# Patient Record
Sex: Female | Born: 1960 | Race: Black or African American | Hispanic: No | Marital: Single | Smoking: Former smoker
Health system: Southern US, Community
[De-identification: ages and names within clinical notes are randomized; demographics above are authoritative.]

## PROBLEM LIST (undated history)

## (undated) DIAGNOSIS — J449 Chronic obstructive pulmonary disease, unspecified: Secondary | ICD-10-CM

## (undated) DIAGNOSIS — I340 Nonrheumatic mitral (valve) insufficiency: Secondary | ICD-10-CM

## (undated) DIAGNOSIS — N2581 Secondary hyperparathyroidism of renal origin: Secondary | ICD-10-CM

## (undated) DIAGNOSIS — N189 Chronic kidney disease, unspecified: Secondary | ICD-10-CM

## (undated) DIAGNOSIS — I10 Essential (primary) hypertension: Secondary | ICD-10-CM

## (undated) DIAGNOSIS — N184 Chronic kidney disease, stage 4 (severe): Secondary | ICD-10-CM

## (undated) DIAGNOSIS — I739 Peripheral vascular disease, unspecified: Secondary | ICD-10-CM

## (undated) DIAGNOSIS — K2289 Other specified disease of esophagus: Secondary | ICD-10-CM

## (undated) DIAGNOSIS — E785 Hyperlipidemia, unspecified: Secondary | ICD-10-CM

## (undated) DIAGNOSIS — R809 Proteinuria, unspecified: Secondary | ICD-10-CM

## (undated) DIAGNOSIS — E119 Type 2 diabetes mellitus without complications: Secondary | ICD-10-CM

## (undated) DIAGNOSIS — Z794 Long term (current) use of insulin: Secondary | ICD-10-CM

## (undated) DIAGNOSIS — I119 Hypertensive heart disease without heart failure: Secondary | ICD-10-CM

## (undated) DIAGNOSIS — E669 Obesity, unspecified: Secondary | ICD-10-CM

## (undated) DIAGNOSIS — I2510 Atherosclerotic heart disease of native coronary artery without angina pectoris: Secondary | ICD-10-CM

## (undated) DIAGNOSIS — M869 Osteomyelitis, unspecified: Secondary | ICD-10-CM

## (undated) DIAGNOSIS — H547 Unspecified visual loss: Secondary | ICD-10-CM

## (undated) DIAGNOSIS — I5189 Other ill-defined heart diseases: Secondary | ICD-10-CM

## (undated) DIAGNOSIS — I700 Atherosclerosis of aorta: Secondary | ICD-10-CM

## (undated) DIAGNOSIS — Z89432 Acquired absence of left foot: Secondary | ICD-10-CM

## (undated) HISTORY — DX: Essential (primary) hypertension: I10

## (undated) HISTORY — PX: CATARACT EXTRACTION W/ INTRAOCULAR LENS IMPLANT: SHX1309

## (undated) HISTORY — PX: CATARACT EXTRACTION: SUR2

## (undated) HISTORY — DX: Nonrheumatic mitral (valve) insufficiency: I34.0

## (undated) HISTORY — DX: Hyperlipidemia, unspecified: E78.5

## (undated) HISTORY — PX: OTHER SURGICAL HISTORY: SHX169

---
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
## 2003-05-29 ENCOUNTER — Other Ambulatory Visit: Payer: Self-pay

## 2005-03-12 ENCOUNTER — Emergency Department: Payer: Self-pay | Admitting: Emergency Medicine

## 2005-03-13 ENCOUNTER — Inpatient Hospital Stay: Payer: Self-pay | Admitting: Internal Medicine

## 2005-06-19 ENCOUNTER — Ambulatory Visit: Payer: Self-pay

## 2005-07-31 ENCOUNTER — Ambulatory Visit: Payer: Self-pay | Admitting: Gynecologic Oncology

## 2005-08-15 ENCOUNTER — Inpatient Hospital Stay: Payer: Self-pay | Admitting: Unknown Physician Specialty

## 2005-08-15 ENCOUNTER — Other Ambulatory Visit: Payer: Self-pay

## 2005-11-10 ENCOUNTER — Emergency Department: Payer: Self-pay | Admitting: Emergency Medicine

## 2006-02-12 ENCOUNTER — Ambulatory Visit: Payer: Self-pay | Admitting: Gynecologic Oncology

## 2006-06-11 ENCOUNTER — Ambulatory Visit: Payer: Self-pay | Admitting: Unknown Physician Specialty

## 2006-07-30 ENCOUNTER — Ambulatory Visit: Payer: Self-pay | Admitting: Ophthalmology

## 2006-08-06 ENCOUNTER — Ambulatory Visit: Payer: Self-pay

## 2007-05-19 ENCOUNTER — Ambulatory Visit: Payer: Self-pay | Admitting: Gynecologic Oncology

## 2007-10-06 ENCOUNTER — Ambulatory Visit: Payer: Self-pay | Admitting: Gynecologic Oncology

## 2007-12-08 ENCOUNTER — Ambulatory Visit: Payer: Self-pay | Admitting: Gynecologic Oncology

## 2007-12-16 ENCOUNTER — Ambulatory Visit: Payer: Self-pay | Admitting: Gynecologic Oncology

## 2008-01-13 ENCOUNTER — Ambulatory Visit: Payer: Self-pay

## 2008-01-24 ENCOUNTER — Other Ambulatory Visit: Payer: Self-pay

## 2008-01-24 ENCOUNTER — Emergency Department: Payer: Self-pay | Admitting: Emergency Medicine

## 2008-02-09 ENCOUNTER — Ambulatory Visit: Payer: Self-pay | Admitting: Gynecologic Oncology

## 2008-03-22 ENCOUNTER — Other Ambulatory Visit: Payer: Self-pay

## 2008-03-22 ENCOUNTER — Inpatient Hospital Stay: Payer: Self-pay | Admitting: Internal Medicine

## 2008-05-02 ENCOUNTER — Encounter: Payer: Self-pay | Admitting: Internal Medicine

## 2008-05-12 ENCOUNTER — Inpatient Hospital Stay: Payer: Self-pay | Admitting: Internal Medicine

## 2008-05-17 ENCOUNTER — Encounter: Payer: Self-pay | Admitting: Internal Medicine

## 2008-06-17 ENCOUNTER — Encounter: Payer: Self-pay | Admitting: Internal Medicine

## 2008-07-18 ENCOUNTER — Encounter: Payer: Self-pay | Admitting: Internal Medicine

## 2008-07-31 ENCOUNTER — Inpatient Hospital Stay: Payer: Self-pay | Admitting: Internal Medicine

## 2008-08-15 ENCOUNTER — Encounter: Payer: Self-pay | Admitting: Internal Medicine

## 2008-08-15 ENCOUNTER — Ambulatory Visit: Payer: Self-pay | Admitting: Gynecologic Oncology

## 2008-08-23 ENCOUNTER — Ambulatory Visit: Payer: Self-pay | Admitting: Gynecologic Oncology

## 2008-09-15 ENCOUNTER — Encounter: Payer: Self-pay | Admitting: Internal Medicine

## 2008-10-15 ENCOUNTER — Encounter: Payer: Self-pay | Admitting: Internal Medicine

## 2008-10-19 ENCOUNTER — Ambulatory Visit: Payer: Self-pay | Admitting: Family

## 2008-10-23 ENCOUNTER — Emergency Department: Payer: Self-pay | Admitting: Emergency Medicine

## 2008-11-02 ENCOUNTER — Other Ambulatory Visit: Payer: Self-pay | Admitting: Family

## 2008-11-10 ENCOUNTER — Inpatient Hospital Stay: Payer: Self-pay | Admitting: Family

## 2008-11-15 ENCOUNTER — Encounter: Payer: Self-pay | Admitting: Internal Medicine

## 2009-01-04 ENCOUNTER — Other Ambulatory Visit: Payer: Self-pay | Admitting: Family

## 2009-01-05 ENCOUNTER — Other Ambulatory Visit: Payer: Self-pay | Admitting: Internal Medicine

## 2009-01-11 ENCOUNTER — Ambulatory Visit: Payer: Self-pay | Admitting: Internal Medicine

## 2009-01-18 ENCOUNTER — Ambulatory Visit: Payer: Self-pay | Admitting: Internal Medicine

## 2009-01-25 ENCOUNTER — Ambulatory Visit: Payer: Self-pay | Admitting: Internal Medicine

## 2009-02-01 ENCOUNTER — Ambulatory Visit: Payer: Self-pay | Admitting: Family

## 2009-02-15 ENCOUNTER — Ambulatory Visit: Payer: Self-pay | Admitting: Internal Medicine

## 2009-04-21 ENCOUNTER — Ambulatory Visit: Payer: Self-pay | Admitting: Family

## 2009-05-31 ENCOUNTER — Ambulatory Visit: Payer: Self-pay | Admitting: Internal Medicine

## 2009-06-12 ENCOUNTER — Ambulatory Visit: Payer: Self-pay | Admitting: Family

## 2009-08-18 ENCOUNTER — Ambulatory Visit: Payer: Self-pay | Admitting: Family

## 2009-10-11 ENCOUNTER — Ambulatory Visit: Payer: Self-pay | Admitting: Ophthalmology

## 2009-12-13 ENCOUNTER — Ambulatory Visit: Payer: Self-pay | Admitting: Ophthalmology

## 2010-08-20 ENCOUNTER — Ambulatory Visit: Payer: Self-pay | Admitting: Internal Medicine

## 2011-02-04 ENCOUNTER — Other Ambulatory Visit: Payer: Self-pay | Admitting: *Deleted

## 2011-02-04 ENCOUNTER — Telehealth: Payer: Self-pay | Admitting: *Deleted

## 2011-02-04 MED ORDER — CARVEDILOL 12.5 MG PO TABS: 12.5000 mg | ORAL_TABLET | Freq: Two times a day (BID) | ORAL | Status: DC

## 2011-02-04 MED ORDER — INSULIN GLARGINE 100 UNIT/ML ~~LOC~~ SOLN: 56.0000 [IU] | Freq: Every day | SUBCUTANEOUS | Status: DC

## 2011-02-04 NOTE — Telephone Encounter (Signed)
Home health RN is req refill for patient. Pt needs Lantus solostar 56 u hs, Med list & pharm updated. OK to send in RF?

## 2011-02-04 NOTE — Telephone Encounter (Signed)
Fine to refill 

## 2011-02-15 ENCOUNTER — Other Ambulatory Visit: Payer: Self-pay | Admitting: Internal Medicine

## 2011-02-15 DIAGNOSIS — E785 Hyperlipidemia, unspecified: Secondary | ICD-10-CM

## 2011-02-15 MED ORDER — SIMVASTATIN 20 MG PO TABS: 20.0000 mg | ORAL_TABLET | Freq: Every evening | ORAL | Status: DC

## 2011-03-26 DIAGNOSIS — R32 Unspecified urinary incontinence: Secondary | ICD-10-CM

## 2011-03-26 DIAGNOSIS — N390 Urinary tract infection, site not specified: Secondary | ICD-10-CM

## 2011-03-26 DIAGNOSIS — E1139 Type 2 diabetes mellitus with other diabetic ophthalmic complication: Secondary | ICD-10-CM

## 2011-03-26 DIAGNOSIS — E11319 Type 2 diabetes mellitus with unspecified diabetic retinopathy without macular edema: Secondary | ICD-10-CM

## 2011-04-01 ENCOUNTER — Encounter: Payer: Self-pay | Admitting: Internal Medicine

## 2011-04-01 ENCOUNTER — Ambulatory Visit (INDEPENDENT_AMBULATORY_CARE_PROVIDER_SITE_OTHER): Payer: Medicaid Other | Admitting: Internal Medicine

## 2011-04-01 ENCOUNTER — Ambulatory Visit: Payer: Self-pay | Admitting: Internal Medicine

## 2011-04-01 DIAGNOSIS — E109 Type 1 diabetes mellitus without complications: Secondary | ICD-10-CM | POA: Insufficient documentation

## 2011-04-01 DIAGNOSIS — E119 Type 2 diabetes mellitus without complications: Secondary | ICD-10-CM

## 2011-04-01 DIAGNOSIS — Z0000 Encounter for general adult medical examination without abnormal findings: Secondary | ICD-10-CM

## 2011-04-01 DIAGNOSIS — I10 Essential (primary) hypertension: Secondary | ICD-10-CM | POA: Insufficient documentation

## 2011-04-01 DIAGNOSIS — T23029A Burn of unspecified degree of unspecified single finger (nail) except thumb, initial encounter: Secondary | ICD-10-CM

## 2011-04-01 DIAGNOSIS — E785 Hyperlipidemia, unspecified: Secondary | ICD-10-CM

## 2011-04-01 LAB — COMPREHENSIVE METABOLIC PANEL
Albumin: 3.4 g/dL — ABNORMAL LOW (ref 3.5–5.2)
CO2: 29 mEq/L (ref 19–32)
Calcium: 9.6 mg/dL (ref 8.4–10.5)
GFR: 54.38 mL/min — ABNORMAL LOW (ref >60.00)
Glucose, Bld: 150 mg/dL — ABNORMAL HIGH (ref 70–99)
Potassium: 4.5 mEq/L (ref 3.5–5.1)
Sodium: 142 mEq/L (ref 135–145)
Total Protein: 6.9 g/dL (ref 6.0–8.3)

## 2011-04-01 LAB — CBC WITH DIFFERENTIAL/PLATELET
Eosinophils Relative: 5.5 % — ABNORMAL HIGH (ref 0.0–5.0)
Monocytes Absolute: 0.7 10*3/uL (ref 0.1–1.0)
Monocytes Relative: 8 % (ref 3.0–12.0)
Neutrophils Relative %: 59.7 % (ref 43.0–77.0)
Platelets: 220 10*3/uL (ref 150.0–400.0)
WBC: 8.8 10*3/uL (ref 4.5–10.5)

## 2011-04-01 NOTE — Patient Instructions (Signed)
Labs today.   Follow up in 3 months.  
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
## 2011-04-02 ENCOUNTER — Encounter: Payer: Self-pay | Admitting: Cardiothoracic Surgery

## 2011-04-03 ENCOUNTER — Encounter: Payer: Self-pay | Admitting: Nurse Practitioner

## 2011-04-09 ENCOUNTER — Ambulatory Visit: Payer: Self-pay | Admitting: Ophthalmology

## 2011-04-16 ENCOUNTER — Telehealth: Payer: Self-pay | Admitting: Internal Medicine

## 2011-04-16 NOTE — Telephone Encounter (Signed)
Chelsea Barton at [facility] is needing surgical clearance for this patient her surgery is October 31.2012.

## 2011-04-16 NOTE — Telephone Encounter (Signed)
Pt needs cardiology clearance.  She must be seen by Dr. Nehemiah Massed

## 2011-04-17 ENCOUNTER — Ambulatory Visit: Payer: Self-pay | Admitting: Ophthalmology

## 2011-04-18 ENCOUNTER — Encounter: Payer: Self-pay | Admitting: Nurse Practitioner

## 2011-04-18 ENCOUNTER — Encounter: Payer: Self-pay | Admitting: Cardiothoracic Surgery

## 2011-04-19 NOTE — Telephone Encounter (Signed)
Informed patient to call Dr. Nehemiah Massed for cardiology clearance b/c he's her cardiologist.

## 2011-06-06 DIAGNOSIS — R32 Unspecified urinary incontinence: Secondary | ICD-10-CM

## 2011-06-06 DIAGNOSIS — N390 Urinary tract infection, site not specified: Secondary | ICD-10-CM

## 2011-06-06 DIAGNOSIS — E11319 Type 2 diabetes mellitus with unspecified diabetic retinopathy without macular edema: Secondary | ICD-10-CM

## 2011-06-06 DIAGNOSIS — E1139 Type 2 diabetes mellitus with other diabetic ophthalmic complication: Secondary | ICD-10-CM

## 2011-07-03 ENCOUNTER — Ambulatory Visit (INDEPENDENT_AMBULATORY_CARE_PROVIDER_SITE_OTHER): Payer: Medicaid Other | Admitting: Internal Medicine

## 2011-07-03 ENCOUNTER — Encounter: Payer: Self-pay | Admitting: Internal Medicine

## 2011-07-03 DIAGNOSIS — I10 Essential (primary) hypertension: Secondary | ICD-10-CM

## 2011-07-03 DIAGNOSIS — E119 Type 2 diabetes mellitus without complications: Secondary | ICD-10-CM

## 2011-07-03 LAB — COMPREHENSIVE METABOLIC PANEL
ALT: 17 U/L (ref 0–35)
AST: 18 U/L (ref 0–37)
Chloride: 106 mEq/L (ref 96–112)
GFR: 73.23 mL/min (ref >60.00)
Potassium: 4.8 mEq/L (ref 3.5–5.1)
Total Bilirubin: 0.6 mg/dL (ref 0.3–1.2)
Total Protein: 6.7 g/dL (ref 6.0–8.3)

## 2011-07-03 MED ORDER — INSULIN GLARGINE 100 UNIT/ML ~~LOC~~ SOLN: 56.0000 [IU] | Freq: Every day | SUBCUTANEOUS | Status: DC

## 2011-07-03 NOTE — Progress Notes (Signed)
Subjective:    Patient ID: Chelsea Barton, female    DOB: 09/09/1960, 51 y.o.   MRN: [id number]  HPI 51YO female with h/o HTN and DM presents for follow up. In regards to DM, she reports that her BG have been well controlled. She has had a couple of lows near 70 which were symptomatic with diaphoresis.  They both responded to eating food.  She notes 1 elevated BG of 206.  Otherwise, most BG well controlled.  Full compliance with insulin. Followed by home health.  In regards to HTN, she did not bring record of BP. Reports full compliance with meds. Home health nurse reports BP well controlled. Denies headache, palpitations, chest pain.  Outpatient Encounter Prescriptions as of 07/03/2011  Medication Sig Dispense Refill  . aspirin 81 MG tablet Take 81 mg by mouth daily.        . carvedilol (COREG) 12.5 MG tablet Take 1 tablet (12.5 mg total) by mouth 2 (two) times daily with a meal.  60 tablet  6  . insulin aspart (NOVOLOG FLEXPEN) 100 UNIT/ML injection Inject into the skin 3 (three) times daily before meals. Inject 4 units if BG is greater than 200       . insulin glargine (LANTUS SOLOSTAR) 100 UNIT/ML injection Inject 56 Units into the skin at bedtime.  20 mL  6  . Multiple Vitamin (MULTIVITAMIN) capsule Take 1 capsule by mouth daily.        . simvastatin (ZOCOR) 20 MG tablet Take 1 tablet (20 mg total) by mouth every evening.  30 tablet  11  . vitamin D, CHOLECALCIFEROL, 400 UNITS tablet Take 400 Units by mouth 2 (two) times daily.          Review of Systems  Constitutional: Negative for fever, chills, appetite change, fatigue and unexpected weight change.  HENT: Negative for ear pain, congestion, sore throat, trouble swallowing, neck pain, voice change and sinus pressure.   Eyes: Negative for visual disturbance.  Respiratory: Negative for cough, shortness of breath, wheezing and stridor.   Cardiovascular: Negative for chest pain, palpitations and leg swelling.  Gastrointestinal: Negative  for nausea, vomiting, abdominal pain, diarrhea, constipation, blood in stool, abdominal distention and anal bleeding.  Genitourinary: Negative for dysuria and flank pain.  Musculoskeletal: Negative for myalgias, arthralgias and gait problem.  Skin: Negative for color change and rash.  Neurological: Negative for dizziness and headaches.  Hematological: Negative for adenopathy. Does not bruise/bleed easily.  Psychiatric/Behavioral: Negative for suicidal ideas, sleep disturbance and dysphoric mood. The patient is not nervous/anxious.    BP 96/50  Pulse 72  Temp(Src) 97.8 F (36.6 C) (Oral)  Ht 5\' 6"  (1.676 m)  Wt 198 lb (89.812 kg)  BMI 31.96 kg/m2  SpO2 92%     Objective:   Physical Exam  Constitutional: She is oriented to person, place, and time. She appears well-developed and well-nourished. No distress.  HENT:  Head: Normocephalic and atraumatic.  Right Ear: External ear normal.  Left Ear: External ear normal.  Nose: Nose normal.  Mouth/Throat: Oropharynx is clear and moist. No oropharyngeal exudate.  Eyes: Conjunctivae are normal. Pupils are equal, round, and reactive to light. Right eye exhibits no discharge. Left eye exhibits no discharge. No scleral icterus.  Neck: Normal range of motion. Neck supple. No tracheal deviation present. No thyromegaly present.  Cardiovascular: Normal rate, regular rhythm, normal heart sounds and intact distal pulses.  Exam reveals no gallop and no friction rub.   No murmur heard. Pulmonary/Chest: Effort  normal and breath sounds normal. No respiratory distress. She has no wheezes. She has no rales. She exhibits no tenderness.  Musculoskeletal: Normal range of motion. She exhibits no edema and no tenderness.  Lymphadenopathy:    She has no cervical adenopathy.  Neurological: She is alert and oriented to person, place, and time. No cranial nerve deficit. She exhibits normal muscle tone. Coordination normal.  Skin: Skin is warm and dry. No rash noted.  She is not diaphoretic. No erythema. No pallor.  Psychiatric: She has a normal mood and affect. Her behavior is normal. Judgment and thought content normal.          Assessment & Plan:  1. Diabetes Mellitus - A1c was elevated last check at 9%.  Will recheck today.  Continue lantus. Follow up 3 months.  2. Hypertension - BP well controlled on current meds. Will check renal function with labs. Follow up in 3 months.

## 2011-07-24 ENCOUNTER — Encounter: Payer: Self-pay | Admitting: Internal Medicine

## 2011-08-15 DIAGNOSIS — E11319 Type 2 diabetes mellitus with unspecified diabetic retinopathy without macular edema: Secondary | ICD-10-CM

## 2011-08-15 DIAGNOSIS — E1139 Type 2 diabetes mellitus with other diabetic ophthalmic complication: Secondary | ICD-10-CM

## 2011-08-15 DIAGNOSIS — N390 Urinary tract infection, site not specified: Secondary | ICD-10-CM

## 2011-08-15 DIAGNOSIS — R32 Unspecified urinary incontinence: Secondary | ICD-10-CM

## 2011-08-21 ENCOUNTER — Telehealth: Payer: Self-pay | Admitting: Internal Medicine

## 2011-08-21 DIAGNOSIS — Z1239 Encounter for other screening for malignant neoplasm of breast: Secondary | ICD-10-CM

## 2011-08-21 NOTE — Telephone Encounter (Signed)
Patient called to get a mammogram appointment. She had received a letter from [facility] and wanted to [name] to set this up.  i made the appointment for April 10 @ 5  Patient is aware of this appointment

## 2011-08-29 ENCOUNTER — Other Ambulatory Visit: Payer: Self-pay | Admitting: *Deleted

## 2011-08-29 MED ORDER — ACCU-CHEK MULTICLIX LANCETS MISC: Status: AC

## 2011-08-30 ENCOUNTER — Other Ambulatory Visit: Payer: Self-pay | Admitting: *Deleted

## 2011-08-30 MED ORDER — CARVEDILOL 12.5 MG PO TABS: 12.5000 mg | ORAL_TABLET | Freq: Two times a day (BID) | ORAL | Status: DC

## 2011-09-25 ENCOUNTER — Ambulatory Visit: Payer: Self-pay | Admitting: Internal Medicine

## 2011-09-27 ENCOUNTER — Other Ambulatory Visit: Payer: Self-pay | Admitting: Internal Medicine

## 2011-09-27 MED ORDER — INSULIN PEN NEEDLE 31G X 8 MM MISC: Status: DC

## 2011-10-09 ENCOUNTER — Encounter: Payer: Self-pay | Admitting: Internal Medicine

## 2011-10-25 ENCOUNTER — Other Ambulatory Visit: Payer: Self-pay | Admitting: *Deleted

## 2011-10-25 MED ORDER — GLUCOSE BLOOD VI STRP: ORAL_STRIP | Status: DC

## 2011-12-16 ENCOUNTER — Ambulatory Visit (INDEPENDENT_AMBULATORY_CARE_PROVIDER_SITE_OTHER): Payer: Medicaid Other | Admitting: Internal Medicine

## 2011-12-16 ENCOUNTER — Encounter: Payer: Self-pay | Admitting: Internal Medicine

## 2011-12-16 VITALS — BP 110/72 | HR 67 | Temp 98.7°F | Ht 66.0 in | Wt 200.5 lb

## 2011-12-16 DIAGNOSIS — E785 Hyperlipidemia, unspecified: Secondary | ICD-10-CM

## 2011-12-16 DIAGNOSIS — E119 Type 2 diabetes mellitus without complications: Secondary | ICD-10-CM

## 2011-12-16 DIAGNOSIS — I10 Essential (primary) hypertension: Secondary | ICD-10-CM

## 2011-12-16 LAB — COMPREHENSIVE METABOLIC PANEL
ALT: 17 U/L (ref 0–35)
Albumin: 3.3 g/dL — ABNORMAL LOW (ref 3.5–5.2)
CO2: 31 mEq/L (ref 19–32)
Calcium: 9.7 mg/dL (ref 8.4–10.5)
Chloride: 99 mEq/L (ref 96–112)
GFR: 64.29 mL/min (ref >60.00)
Potassium: 5 mEq/L (ref 3.5–5.1)
Sodium: 139 mEq/L (ref 135–145)
Total Bilirubin: 0.5 mg/dL (ref 0.3–1.2)
Total Protein: 7.2 g/dL (ref 6.0–8.3)

## 2011-12-16 LAB — MICROALBUMIN / CREATININE URINE RATIO: Microalb Creat Ratio: 54.4 mg/g — ABNORMAL HIGH (ref 0.0–30.0)

## 2011-12-16 NOTE — Assessment & Plan Note (Signed)
Blood pressure well-controlled today. We'll continue current medications. We'll check renal function with labs. Followup 3 months.

## 2011-12-16 NOTE — Assessment & Plan Note (Signed)
Will check lipids with labs today. Goal LDL less than 70. 

## 2011-12-16 NOTE — Assessment & Plan Note (Signed)
Per patient, blood sugars well controlled. Will check A1c with labs today. We'll continue current medications. Followup 3 months.

## 2011-12-16 NOTE — Progress Notes (Signed)
Subjective:    Patient ID: Chelsea Barton, female    DOB: 1961-05-08, 51 y.o.   MRN: [id number]  HPI 51 year old female with history of diabetes, hypertension, hyperlipidemia presents for followup. She reports she has been doing well. In regards to her diabetes, she reports her blood sugars have been fairly well-controlled. Fasting sugar this morning was 120. She reports full compliance with her medications.  In regards to hypertension hyperlipidemia, she also reports full compliance with her medicines. She denies any side effects such as headache, palpitations, chest pain, myalgia. She denies any new concerns today.  Outpatient Encounter Prescriptions as of 12/16/2011  Medication Sig Dispense Refill  . aspirin 81 MG tablet Take 81 mg by mouth daily.        . carvedilol (COREG) 12.5 MG tablet Take 1 tablet (12.5 mg total) by mouth 2 (two) times daily with a meal.  180 tablet  1  . glucose blood (ACCU-CHEK AVIVA PLUS) test strip Test three times daily  100 each  12  . insulin aspart (NOVOLOG FLEXPEN) 100 UNIT/ML injection Inject into the skin 3 (three) times daily before meals. Inject 4 units if BG is greater than 200       . insulin glargine (LANTUS SOLOSTAR) 100 UNIT/ML injection Inject 56 Units into the skin at bedtime.  20 mL  6  . Insulin Pen Needle (B-D ULTRAFINE III SHORT PEN) 31G X 8 MM MISC Use as directed  100 each  8  . Lancets (ACCU-CHEK MULTICLIX) lancets Use as instructed  100 each  12  . Multiple Vitamin (MULTIVITAMIN) capsule Take 1 capsule by mouth daily.        . simvastatin (ZOCOR) 20 MG tablet Take 1 tablet (20 mg total) by mouth every evening.  30 tablet  11  . vitamin D, CHOLECALCIFEROL, 400 UNITS tablet Take 400 Units by mouth 2 (two) times daily.          Review of Systems  Constitutional: Negative for fever, chills, appetite change, fatigue and unexpected weight change.  HENT: Negative for ear pain, congestion, sore throat, trouble swallowing, neck pain, voice change  and sinus pressure.   Eyes: Negative for visual disturbance.  Respiratory: Negative for cough, shortness of breath, wheezing and stridor.   Cardiovascular: Negative for chest pain, palpitations and leg swelling.  Gastrointestinal: Negative for nausea, vomiting, abdominal pain, diarrhea, constipation, blood in stool, abdominal distention and anal bleeding.  Genitourinary: Negative for dysuria and flank pain.  Musculoskeletal: Negative for myalgias, arthralgias and gait problem.  Skin: Negative for color change and rash.  Neurological: Negative for dizziness and headaches.  Hematological: Negative for adenopathy. Does not bruise/bleed easily.  Psychiatric/Behavioral: Negative for suicidal ideas, disturbed wake/sleep cycle and dysphoric mood. The patient is not nervous/anxious.        Objective:   Physical Exam  Constitutional: She is oriented to person, place, and time. She appears well-developed and well-nourished. No distress.  HENT:  Head: Normocephalic and atraumatic.  Right Ear: External ear normal.  Left Ear: External ear normal.  Nose: Nose normal.  Mouth/Throat: Oropharynx is clear and moist. No oropharyngeal exudate.  Eyes: Conjunctivae are normal. Pupils are equal, round, and reactive to light. Right eye exhibits no discharge. Left eye exhibits no discharge. No scleral icterus.  Neck: Normal range of motion. Neck supple. No tracheal deviation present. No thyromegaly present.  Cardiovascular: Normal rate, regular rhythm, normal heart sounds and intact distal pulses.  Exam reveals no gallop and no friction rub.  No murmur heard. Pulmonary/Chest: Effort normal and breath sounds normal. No respiratory distress. She has no wheezes. She has no rales. She exhibits no tenderness.  Abdominal: Soft. Bowel sounds are normal. She exhibits no distension and no mass. There is no tenderness. There is no guarding.  Musculoskeletal: Normal range of motion. She exhibits no edema and no  tenderness.  Lymphadenopathy:    She has no cervical adenopathy.  Neurological: She is alert and oriented to person, place, and time. No cranial nerve deficit. She exhibits normal muscle tone. Coordination normal.  Skin: Skin is warm and dry. No rash noted. She is not diaphoretic. No erythema. No pallor.  Psychiatric: She has a normal mood and affect. Her behavior is normal. Judgment and thought content normal.          Assessment & Plan:

## 2012-01-09 ENCOUNTER — Encounter: Payer: Self-pay | Admitting: Internal Medicine

## 2012-01-13 DIAGNOSIS — E11319 Type 2 diabetes mellitus with unspecified diabetic retinopathy without macular edema: Secondary | ICD-10-CM

## 2012-01-13 DIAGNOSIS — R32 Unspecified urinary incontinence: Secondary | ICD-10-CM

## 2012-01-13 DIAGNOSIS — N390 Urinary tract infection, site not specified: Secondary | ICD-10-CM

## 2012-01-13 DIAGNOSIS — E1139 Type 2 diabetes mellitus with other diabetic ophthalmic complication: Secondary | ICD-10-CM

## 2012-01-15 LAB — HM PAP SMEAR: HM Pap smear: NORMAL

## 2012-01-31 ENCOUNTER — Other Ambulatory Visit: Payer: Self-pay | Admitting: *Deleted

## 2012-01-31 MED ORDER — INSULIN GLARGINE 100 UNIT/ML ~~LOC~~ SOLN: 56.0000 [IU] | Freq: Every day | SUBCUTANEOUS | Status: DC

## 2012-02-11 DIAGNOSIS — N390 Urinary tract infection, site not specified: Secondary | ICD-10-CM

## 2012-02-11 DIAGNOSIS — R32 Unspecified urinary incontinence: Secondary | ICD-10-CM

## 2012-02-11 DIAGNOSIS — E1139 Type 2 diabetes mellitus with other diabetic ophthalmic complication: Secondary | ICD-10-CM

## 2012-02-11 DIAGNOSIS — E11319 Type 2 diabetes mellitus with unspecified diabetic retinopathy without macular edema: Secondary | ICD-10-CM

## 2012-02-18 ENCOUNTER — Other Ambulatory Visit: Payer: Self-pay | Admitting: *Deleted

## 2012-02-18 MED ORDER — INSULIN ASPART 100 UNIT/ML ~~LOC~~ SOLN: SUBCUTANEOUS | Status: DC

## 2012-02-18 MED ORDER — SIMVASTATIN 20 MG PO TABS: 20.0000 mg | ORAL_TABLET | Freq: Every evening | ORAL | Status: DC

## 2012-02-18 NOTE — Telephone Encounter (Signed)
I verified with patient that she does take Simvastatin 20mg .  This medication was not on her medication list.  Medication was added and refilled.

## 2012-02-28 ENCOUNTER — Other Ambulatory Visit: Payer: Self-pay | Admitting: *Deleted

## 2012-02-28 MED ORDER — CARVEDILOL 12.5 MG PO TABS: 12.5000 mg | ORAL_TABLET | Freq: Two times a day (BID) | ORAL | Status: DC

## 2012-03-17 ENCOUNTER — Ambulatory Visit: Payer: Medicaid Other | Admitting: Internal Medicine

## 2012-03-17 DIAGNOSIS — Z0289 Encounter for other administrative examinations: Secondary | ICD-10-CM

## 2012-03-27 DIAGNOSIS — E1139 Type 2 diabetes mellitus with other diabetic ophthalmic complication: Secondary | ICD-10-CM

## 2012-03-27 DIAGNOSIS — R339 Retention of urine, unspecified: Secondary | ICD-10-CM

## 2012-03-27 DIAGNOSIS — E11319 Type 2 diabetes mellitus with unspecified diabetic retinopathy without macular edema: Secondary | ICD-10-CM

## 2012-03-27 DIAGNOSIS — N390 Urinary tract infection, site not specified: Secondary | ICD-10-CM

## 2012-04-16 ENCOUNTER — Ambulatory Visit (INDEPENDENT_AMBULATORY_CARE_PROVIDER_SITE_OTHER): Payer: Medicaid Other | Admitting: Internal Medicine

## 2012-04-16 ENCOUNTER — Encounter: Payer: Self-pay | Admitting: Internal Medicine

## 2012-04-16 VITALS — BP 140/80 | HR 70 | Temp 98.0°F | Ht 66.0 in | Wt 203.8 lb

## 2012-04-16 DIAGNOSIS — I10 Essential (primary) hypertension: Secondary | ICD-10-CM

## 2012-04-16 DIAGNOSIS — E785 Hyperlipidemia, unspecified: Secondary | ICD-10-CM

## 2012-04-16 DIAGNOSIS — E119 Type 2 diabetes mellitus without complications: Secondary | ICD-10-CM

## 2012-04-16 LAB — LIPID PANEL
HDL: 44.6 mg/dL (ref >39.00)
LDL Cholesterol: 89 mg/dL (ref 0–99)
Total CHOL/HDL Ratio: 3
Triglycerides: 111 mg/dL (ref 0.0–149.0)
VLDL: 22.2 mg/dL (ref 0.0–40.0)

## 2012-04-16 LAB — COMPREHENSIVE METABOLIC PANEL
AST: 17 U/L (ref 0–37)
Alkaline Phosphatase: 63 U/L (ref 39–117)
BUN: 24 mg/dL — ABNORMAL HIGH (ref 6–23)
Creatinine, Ser: 1.2 mg/dL (ref 0.4–1.2)
Potassium: 4.5 mEq/L (ref 3.5–5.1)

## 2012-04-16 MED ORDER — INSULIN GLARGINE 100 UNIT/ML ~~LOC~~ SOLN: 56.0000 [IU] | Freq: Every day | SUBCUTANEOUS | Status: DC

## 2012-04-16 MED ORDER — SIMVASTATIN 20 MG PO TABS: 20.0000 mg | ORAL_TABLET | Freq: Every evening | ORAL | Status: DC

## 2012-04-16 NOTE — Assessment & Plan Note (Signed)
A1c improved compared to previous labs at 8.3% but still above goal. Encouraged better compliance with diet. Doubt she would tolerate increase in insulin because of episodes of hypoglycemia and labile sugar. Will continue current medications. Followup in 3 months.

## 2012-04-16 NOTE — Progress Notes (Signed)
Subjective:    Patient ID: Chelsea Barton, female    DOB: [birth year], 51 y.o.   MRN: [id number]  HPI 51 year old female with history of diabetes, hypertension, hyperlipidemia presents for followup. She reports she is generally doing well. In regards to her diabetes, she reports blood sugars have been variable, however most blood sugars are near 150. She is followed by home health nurse for assistance with blood sugar control and monitoring. She notes some dietary indiscretion over the last few weeks. Next  Regards to hypertension hyperlipidemia, she reports full compliance with medications. She denies any chest pain, palpitations, headache. She denies any new concerns today.    Outpatient Encounter Prescriptions as of 04/16/2012  Medication Sig Dispense Refill  . aspirin 81 MG tablet Take 81 mg by mouth daily.        . carvedilol (COREG) 12.5 MG tablet Take 1 tablet (12.5 mg total) by mouth 2 (two) times daily with a meal.  180 tablet  3  . glucose blood (ACCU-CHEK AVIVA PLUS) test strip Test three times daily  100 each  12  . insulin aspart (NOVOLOG FLEXPEN) 100 UNIT/ML injection Inject 4 units if BG is greater than 200  3 mL  12  . insulin glargine (LANTUS SOLOSTAR) 100 UNIT/ML injection Inject 56 Units into the skin at bedtime.  60 mL  6  . Insulin Pen Needle (B-D ULTRAFINE III SHORT PEN) 31G X 8 MM MISC Use as directed  100 each  8  . Lancets (ACCU-CHEK MULTICLIX) lancets Use as instructed  100 each  12  . Multiple Vitamin (MULTIVITAMIN) capsule Take 1 capsule by mouth daily.        . simvastatin (ZOCOR) 20 MG tablet Take 1 tablet (20 mg total) by mouth every evening.  90 tablet  4  . vitamin D, CHOLECALCIFEROL, 400 UNITS tablet Take 400 Units by mouth 2 (two) times daily.          BP 140/80  Pulse 70  Temp 98 F (36.7 C) (Oral)  Ht 5\' 6"  (1.676 m)  Wt 203 lb 12 oz (92.42 kg)  BMI 32.89 kg/m2  SpO2 95%  Review of Systems  Constitutional: Negative for fever, chills, appetite  change, fatigue and unexpected weight change.  HENT: Negative for ear pain, congestion, sore throat, trouble swallowing, neck pain, voice change and sinus pressure.   Eyes: Negative for visual disturbance.  Respiratory: Negative for cough, shortness of breath, wheezing and stridor.   Cardiovascular: Negative for chest pain, palpitations and leg swelling.  Gastrointestinal: Negative for nausea, vomiting, abdominal pain, diarrhea, constipation, blood in stool, abdominal distention and anal bleeding.  Genitourinary: Negative for dysuria and flank pain.  Musculoskeletal: Negative for myalgias, arthralgias and gait problem.  Skin: Negative for color change and rash.  Neurological: Negative for dizziness and headaches.  Hematological: Negative for adenopathy. Does not bruise/bleed easily.  Psychiatric/Behavioral: Negative for suicidal ideas, disturbed wake/sleep cycle and dysphoric mood. The patient is not nervous/anxious.        Objective:   Physical Exam  Constitutional: She is oriented to person, place, and time. She appears well-developed and well-nourished. No distress.  HENT:  Head: Normocephalic and atraumatic.  Right Ear: External ear normal.  Left Ear: External ear normal.  Nose: Nose normal.  Mouth/Throat: Oropharynx is clear and moist. No oropharyngeal exudate.  Eyes: Conjunctivae normal are normal. Pupils are equal, round, and reactive to light. Right eye exhibits no discharge. Left eye exhibits no discharge. No scleral icterus.  Neck:  Normal range of motion. Neck supple. No tracheal deviation present. No thyromegaly present.  Cardiovascular: Normal rate, regular rhythm, normal heart sounds and intact distal pulses.  Exam reveals no gallop and no friction rub.   No murmur heard. Pulmonary/Chest: Effort normal and breath sounds normal. No respiratory distress. She has no wheezes. She has no rales. She exhibits no tenderness.  Musculoskeletal: Normal range of motion. She exhibits no  edema and no tenderness.  Lymphadenopathy:    She has no cervical adenopathy.  Neurological: She is alert and oriented to person, place, and time. No cranial nerve deficit. She exhibits normal muscle tone. Coordination normal.  Skin: Skin is warm and dry. No rash noted. She is not diaphoretic. No erythema. No pallor.  Psychiatric: She has a normal mood and affect. Her behavior is normal. Judgment and thought content normal.          Assessment & Plan:

## 2012-04-16 NOTE — Assessment & Plan Note (Signed)
LDL just above goal of <70 at 89. LFTs normal. Will continue Simvastatin. Follow up 3 months.

## 2012-04-16 NOTE — Assessment & Plan Note (Signed)
Patient reports blood pressure well-controlled at home. Blood pressure slightly elevated in clinic today. We'll check renal function with labs today. Followup 3 months or sooner as needed.

## 2012-04-21 ENCOUNTER — Encounter: Payer: Self-pay | Admitting: *Deleted

## 2012-04-21 NOTE — Progress Notes (Signed)
Result letter mailed to patient's home address.

## 2012-07-17 ENCOUNTER — Ambulatory Visit: Payer: Medicaid Other | Admitting: Internal Medicine

## 2012-09-03 ENCOUNTER — Encounter: Payer: Self-pay | Admitting: Internal Medicine

## 2012-09-10 ENCOUNTER — Ambulatory Visit (INDEPENDENT_AMBULATORY_CARE_PROVIDER_SITE_OTHER): Payer: Medicaid Other | Admitting: Internal Medicine

## 2012-09-10 ENCOUNTER — Encounter: Payer: Self-pay | Admitting: Internal Medicine

## 2012-09-10 VITALS — BP 162/80 | HR 76 | Temp 98.2°F | Wt 202.0 lb

## 2012-09-10 DIAGNOSIS — E119 Type 2 diabetes mellitus without complications: Secondary | ICD-10-CM

## 2012-09-10 DIAGNOSIS — I10 Essential (primary) hypertension: Secondary | ICD-10-CM

## 2012-09-10 DIAGNOSIS — E785 Hyperlipidemia, unspecified: Secondary | ICD-10-CM

## 2012-09-10 DIAGNOSIS — Z1239 Encounter for other screening for malignant neoplasm of breast: Secondary | ICD-10-CM

## 2012-09-10 MED ORDER — INSULIN GLARGINE 100 UNIT/ML ~~LOC~~ SOLN: 56.0000 [IU] | Freq: Every day | SUBCUTANEOUS | Status: DC

## 2012-09-10 MED ORDER — CARVEDILOL 12.5 MG PO TABS: 12.5000 mg | ORAL_TABLET | Freq: Two times a day (BID) | ORAL | Status: DC

## 2012-09-10 MED ORDER — SIMVASTATIN 20 MG PO TABS: 20.0000 mg | ORAL_TABLET | Freq: Every evening | ORAL | Status: DC

## 2012-09-10 NOTE — Assessment & Plan Note (Signed)
Pt reports BG well controlled at home. Will check A1c with labs today. Continue current medications. Foot exam normal today.

## 2012-09-10 NOTE — Assessment & Plan Note (Signed)
BP Readings from Last 3 Encounters:  09/10/12 162/80  04/16/12 140/80  12/16/11 110/72   BP slightly elevated today, but generally well controlled when checked at home by [facility] nurse. Will continue to monitor. Continue Carvedilol.

## 2012-09-10 NOTE — Assessment & Plan Note (Signed)
Will check lipids and LFTs with labs. Continue Simvastatin. 

## 2012-09-10 NOTE — Progress Notes (Signed)
Subjective:    Patient ID: Chelsea Barton, female    DOB: 03/08/1961, 52 y.o.   MRN: [id number]  HPI 52 year old female with history of diabetes, hypertension, hyperlipidemia presents for followup. She reports she is generally feeling well. No concerns today. She reports blood sugars have been well-controlled. She did not bring record of blood sugars with her today. However, she is followed by home health nurse and denies any blood sugars greater than 250 or below 80. She is compliant with her medications. She denies any recent episodes of chest pain, shortness of breath, palpitations. She would like to schedule her annual mammogram. She declines colonoscopy.  Outpatient Prescriptions Prior to Visit  Medication Sig Dispense Refill  . aspirin 81 MG tablet Take 81 mg by mouth daily.        Marland Kitchen glucose blood (ACCU-CHEK AVIVA PLUS) test strip Test three times daily  100 each  12  . insulin aspart (NOVOLOG FLEXPEN) 100 UNIT/ML injection Inject 4 units if BG is greater than 200  3 mL  12  . Insulin Pen Needle (B-D ULTRAFINE III SHORT PEN) 31G X 8 MM MISC Use as directed  100 each  8  . Multiple Vitamin (MULTIVITAMIN) capsule Take 1 capsule by mouth daily.        . vitamin D, CHOLECALCIFEROL, 400 UNITS tablet Take 400 Units by mouth 2 (two) times daily.        . carvedilol (COREG) 12.5 MG tablet Take 1 tablet (12.5 mg total) by mouth 2 (two) times daily with a meal.  180 tablet  3  . insulin glargine (LANTUS SOLOSTAR) 100 UNIT/ML injection Inject 56 Units into the skin at bedtime.  60 mL  6  . simvastatin (ZOCOR) 20 MG tablet Take 1 tablet (20 mg total) by mouth every evening.  90 tablet  4   No facility-administered medications prior to visit.   BP 162/80  Pulse 76  Temp(Src) 98.2 F (36.8 C) (Oral)  Wt 202 lb (91.627 kg)  BMI 32.62 kg/m2  SpO2 98%  Review of Systems  Constitutional: Negative for fever, chills, appetite change, fatigue and unexpected weight change.  HENT: Negative for ear  pain, congestion, sore throat, trouble swallowing, neck pain, voice change and sinus pressure.   Eyes: Negative for visual disturbance.  Respiratory: Negative for cough, shortness of breath, wheezing and stridor.   Cardiovascular: Negative for chest pain, palpitations and leg swelling.  Gastrointestinal: Negative for nausea, vomiting, abdominal pain, diarrhea, constipation, blood in stool, abdominal distention and anal bleeding.  Genitourinary: Negative for dysuria and flank pain.  Musculoskeletal: Negative for myalgias, arthralgias and gait problem.  Skin: Negative for color change and rash.  Neurological: Negative for dizziness and headaches.  Hematological: Negative for adenopathy. Does not bruise/bleed easily.  Psychiatric/Behavioral: Negative for suicidal ideas, sleep disturbance and dysphoric mood. The patient is not nervous/anxious.        Objective:   Physical Exam  Constitutional: She is oriented to person, place, and time. She appears well-developed and well-nourished. No distress.  HENT:  Head: Normocephalic and atraumatic.  Right Ear: External ear normal.  Left Ear: External ear normal.  Nose: Nose normal.  Mouth/Throat: Oropharynx is clear and moist. No oropharyngeal exudate.  Eyes: Conjunctivae are normal. Pupils are equal, round, and reactive to light. Right eye exhibits no discharge. Left eye exhibits no discharge. No scleral icterus.  Neck: Normal range of motion. Neck supple. No tracheal deviation present. No thyromegaly present.  Cardiovascular: Normal rate, regular rhythm, normal  heart sounds and intact distal pulses.  Exam reveals no gallop and no friction rub.   No murmur heard. Pulmonary/Chest: Effort normal and breath sounds normal. No respiratory distress. She has no wheezes. She has no rales. She exhibits no tenderness.  Musculoskeletal: Normal range of motion. She exhibits no edema and no tenderness.  Lymphadenopathy:    She has no cervical adenopathy.   Neurological: She is alert and oriented to person, place, and time. No cranial nerve deficit. She exhibits normal muscle tone. Coordination normal.  Skin: Skin is warm and dry. No rash noted. She is not diaphoretic. No erythema. No pallor.  Psychiatric: She has a normal mood and affect. Her behavior is normal. Judgment and thought content normal.          Assessment & Plan:

## 2012-09-11 LAB — LIPID PANEL
HDL: 43 mg/dL (ref >39.00)
Triglycerides: 257 mg/dL — ABNORMAL HIGH (ref 0.0–149.0)

## 2012-09-11 LAB — COMPREHENSIVE METABOLIC PANEL
AST: 17 U/L (ref 0–37)
BUN: 18 mg/dL (ref 6–23)
Calcium: 8.7 mg/dL (ref 8.4–10.5)
Chloride: 97 mEq/L (ref 96–112)
Creatinine, Ser: 1.1 mg/dL (ref 0.4–1.2)
GFR: 64.11 mL/min (ref >60.00)

## 2012-09-11 LAB — LDL CHOLESTEROL, DIRECT: Direct LDL: 84.9 mg/dL

## 2012-10-06 ENCOUNTER — Ambulatory Visit: Payer: Self-pay | Admitting: Internal Medicine

## 2012-11-06 ENCOUNTER — Other Ambulatory Visit: Payer: Self-pay | Admitting: *Deleted

## 2012-11-10 MED ORDER — INSULIN PEN NEEDLE 31G X 8 MM MISC: Status: DC

## 2012-11-10 NOTE — Telephone Encounter (Signed)
Eprescribed.

## 2012-12-14 ENCOUNTER — Other Ambulatory Visit (HOSPITAL_COMMUNITY)
Admission: RE | Admit: 2012-12-14 | Discharge: 2012-12-14 | Disposition: A | Discharge status: Home / Self Care | Payer: Medicaid Other | Source: Ambulatory Visit | Attending: Internal Medicine | Admitting: Internal Medicine

## 2012-12-14 ENCOUNTER — Encounter: Payer: Self-pay | Admitting: Internal Medicine

## 2012-12-14 ENCOUNTER — Ambulatory Visit (INDEPENDENT_AMBULATORY_CARE_PROVIDER_SITE_OTHER): Payer: Medicaid Other | Admitting: Internal Medicine

## 2012-12-14 VITALS — BP 140/74 | HR 74 | Temp 98.3°F | Ht 65.0 in | Wt 207.0 lb

## 2012-12-14 DIAGNOSIS — Z01419 Encounter for gynecological examination (general) (routine) without abnormal findings: Secondary | ICD-10-CM | POA: Insufficient documentation

## 2012-12-14 DIAGNOSIS — E119 Type 2 diabetes mellitus without complications: Secondary | ICD-10-CM

## 2012-12-14 DIAGNOSIS — R809 Proteinuria, unspecified: Secondary | ICD-10-CM

## 2012-12-14 DIAGNOSIS — I10 Essential (primary) hypertension: Secondary | ICD-10-CM

## 2012-12-14 DIAGNOSIS — E785 Hyperlipidemia, unspecified: Secondary | ICD-10-CM

## 2012-12-14 DIAGNOSIS — Z0000 Encounter for general adult medical examination without abnormal findings: Secondary | ICD-10-CM

## 2012-12-14 DIAGNOSIS — Z1151 Encounter for screening for human papillomavirus (HPV): Secondary | ICD-10-CM | POA: Insufficient documentation

## 2012-12-14 DIAGNOSIS — E669 Obesity, unspecified: Secondary | ICD-10-CM | POA: Insufficient documentation

## 2012-12-14 MED ORDER — ALCOHOL SWABS PADS: 1.0000 "application " | MEDICATED_PAD | Status: DC | PRN

## 2012-12-14 NOTE — Progress Notes (Signed)
Subjective:    Patient ID: Chelsea Barton, female    DOB: 17-Oct-1960, 52 y.o.   MRN: [id number]  HPI 52 year old female with history of diabetes, hypertension, hyperlipidemia presents for annual exam. She reports she is generally feeling well. She reports blood sugars have been well-controlled at home however she did not bring record with her today. She is compliant with her medications. She denies any new concerns today. She is up-to-date on her mammogram which was performed in May 2014. She declines colonoscopy. She is up-to-date on immunizations.  Outpatient Encounter Prescriptions as of 12/14/2012  Medication Sig Dispense Refill  . aspirin 81 MG tablet Take 81 mg by mouth daily.        . carvedilol (COREG) 12.5 MG tablet Take 1 tablet (12.5 mg total) by mouth 2 (two) times daily with a meal.  180 tablet  3  . glucose blood (ACCU-CHEK AVIVA PLUS) test strip Test three times daily  100 each  12  . insulin aspart (NOVOLOG FLEXPEN) 100 UNIT/ML injection Inject 4 units if BG is greater than 200  3 mL  12  . insulin glargine (LANTUS SOLOSTAR) 100 UNIT/ML injection Inject 0.56 mLs (56 Units total) into the skin at bedtime.  60 mL  6  . Insulin Pen Needle (B-D ULTRAFINE III SHORT PEN) 31G X 8 MM MISC Use as directed  100 each  2  . Multiple Vitamin (MULTIVITAMIN) capsule Take 1 capsule by mouth daily.        . simvastatin (ZOCOR) 20 MG tablet Take 1 tablet (20 mg total) by mouth every evening.  90 tablet  4  . vitamin D, CHOLECALCIFEROL, 400 UNITS tablet Take 400 Units by mouth 2 (two) times daily.        . Alcohol Swabs PADS 1 application by Does not apply route as needed.  100 each  6   No facility-administered encounter medications on file as of 12/14/2012.   BP 140/74  Pulse 74  Temp(Src) 98.3 F (36.8 C) (Oral)  Ht 5\' 5"  (1.651 m)  Wt 207 lb (93.895 kg)  BMI 34.45 kg/m2  SpO2 97%  Review of Systems  Constitutional: Negative for fever, chills, appetite change, fatigue and unexpected  weight change.  HENT: Negative for ear pain, congestion, sore throat, trouble swallowing, neck pain, voice change and sinus pressure.   Eyes: Negative for visual disturbance.  Respiratory: Negative for cough, shortness of breath, wheezing and stridor.   Cardiovascular: Negative for chest pain, palpitations and leg swelling.  Gastrointestinal: Negative for nausea, vomiting, abdominal pain, diarrhea, constipation, blood in stool, abdominal distention and anal bleeding.  Genitourinary: Negative for dysuria and flank pain.  Musculoskeletal: Negative for myalgias, arthralgias and gait problem.  Skin: Negative for color change and rash.  Neurological: Negative for dizziness and headaches.  Hematological: Negative for adenopathy. Does not bruise/bleed easily.  Psychiatric/Behavioral: Negative for suicidal ideas, sleep disturbance and dysphoric mood. The patient is not nervous/anxious.        Objective:   Physical Exam  Constitutional: She is oriented to person, place, and time. She appears well-developed and well-nourished. No distress.  HENT:  Head: Normocephalic and atraumatic.  Right Ear: External ear normal.  Left Ear: External ear normal.  Nose: Nose normal.  Mouth/Throat: Oropharynx is clear and moist. No oropharyngeal exudate.  Eyes: Conjunctivae are normal. Pupils are equal, round, and reactive to light. Right eye exhibits no discharge. Left eye exhibits no discharge. No scleral icterus.  Neck: Normal range of motion. Neck supple.  No tracheal deviation present. No thyromegaly present.  Cardiovascular: Normal rate, regular rhythm, normal heart sounds and intact distal pulses.  Exam reveals no gallop and no friction rub.   No murmur heard. Pulmonary/Chest: Effort normal and breath sounds normal. No accessory muscle usage. Not tachypneic. No respiratory distress. She has no decreased breath sounds. She has no wheezes. She has no rhonchi. She has no rales. She exhibits no tenderness.   Abdominal: Soft. Bowel sounds are normal. She exhibits no distension and no mass. There is no tenderness. There is no rebound and no guarding.  Genitourinary: Rectum normal, vagina normal and uterus normal. No breast swelling, tenderness, discharge or bleeding. Pelvic exam was performed with patient supine. There is no rash, tenderness or lesion on the right labia. There is no rash, tenderness or lesion on the left labia. Uterus is not enlarged and not tender. Cervix exhibits no motion tenderness, no discharge and no friability. Right adnexum displays no mass, no tenderness and no fullness. Left adnexum displays no mass, no tenderness and no fullness. No erythema or tenderness around the vagina. No vaginal discharge found.  Musculoskeletal: Normal range of motion. She exhibits no edema and no tenderness.  Lymphadenopathy:    She has no cervical adenopathy.  Neurological: She is alert and oriented to person, place, and time. No cranial nerve deficit. She exhibits normal muscle tone. Coordination normal.  Skin: Skin is warm and dry. No rash noted. She is not diaphoretic. No erythema. No pallor.  Psychiatric: She has a normal mood and affect. Her behavior is normal. Judgment and thought content normal.          Assessment & Plan:

## 2012-12-14 NOTE — Assessment & Plan Note (Signed)
Will check lipids and LFTs with labs today. 

## 2012-12-14 NOTE — Assessment & Plan Note (Signed)
BP Readings from Last 3 Encounters:  12/14/12 140/74  09/10/12 162/80  04/16/12 140/80   BP well controlled on current medications. Will continue. Will check renal function with labs.

## 2012-12-14 NOTE — Assessment & Plan Note (Signed)
Wt Readings from Last 3 Encounters:  12/14/12 207 lb (93.895 kg)  09/10/12 202 lb (91.627 kg)  04/16/12 203 lb 12 oz (92.42 kg)   Encouraged healthy diet and regular physical activity. Encouraged limitation of processed carbohydrates.

## 2012-12-14 NOTE — Assessment & Plan Note (Signed)
General medical exam including breast and pelvic exam normal today. Pap is pending. Mammogram is up-to-date. Colonoscopy declined. Immunizations are up-to-date. Will check labs including CBC, CMP, lipid profile. Will also check A1c given patient's history of diabetes. Encouraged healthy diet and regular physical activity. Followup in 3 months or sooner as needed.

## 2012-12-14 NOTE — Assessment & Plan Note (Signed)
Lab Results  Component Value Date   HGBA1C 9.0* 09/10/2012   Pt reports good control of BG. Will check A1c with labs today.

## 2012-12-15 LAB — CBC WITH DIFFERENTIAL/PLATELET
Basophils Absolute: 0 10*3/uL (ref 0.0–0.1)
Basophils Relative: 0.4 % (ref 0.0–3.0)
Eosinophils Absolute: 0.6 10*3/uL (ref 0.0–0.7)
Eosinophils Relative: 5.7 % — ABNORMAL HIGH (ref 0.0–5.0)
HCT: 37.7 % (ref 36.0–46.0)
Hemoglobin: 12 g/dL (ref 12.0–15.0)
Lymphocytes Relative: 26.2 % (ref 12.0–46.0)
Monocytes Absolute: 0.6 10*3/uL (ref 0.1–1.0)
Monocytes Relative: 5.2 % (ref 3.0–12.0)
WBC: 11.2 10*3/uL — ABNORMAL HIGH (ref 4.5–10.5)

## 2012-12-15 LAB — COMPREHENSIVE METABOLIC PANEL
BUN: 23 mg/dL (ref 6–23)
CO2: 31 mEq/L (ref 19–32)
Creatinine, Ser: 1.2 mg/dL (ref 0.4–1.2)
GFR: 62.15 mL/min (ref >60.00)
Glucose, Bld: 239 mg/dL — ABNORMAL HIGH (ref 70–99)
Total Bilirubin: 0.3 mg/dL (ref 0.3–1.2)

## 2012-12-15 LAB — MICROALBUMIN / CREATININE URINE RATIO: Creatinine,U: 163.5 mg/dL

## 2012-12-16 ENCOUNTER — Other Ambulatory Visit: Payer: Self-pay | Admitting: Internal Medicine

## 2012-12-16 NOTE — Telephone Encounter (Signed)
Eprescribed.

## 2012-12-16 NOTE — Addendum Note (Signed)
Addended by: Ronette Deter A on: 12/16/2012 09:25 AM   Modules accepted: Orders, Medications

## 2012-12-22 ENCOUNTER — Encounter: Payer: Self-pay | Admitting: *Deleted

## 2012-12-24 ENCOUNTER — Other Ambulatory Visit: Payer: Self-pay

## 2013-01-01 ENCOUNTER — Telehealth: Payer: Self-pay | Admitting: Internal Medicine

## 2013-01-01 NOTE — Telephone Encounter (Signed)
Pt called stating she received letter from dr walker office stating it was time for eye exam.  Pt stated she goes to [facility] and her appointment was June of this year

## 2013-01-05 NOTE — Telephone Encounter (Signed)
FYI to Dr. Walker 

## 2013-01-05 NOTE — Telephone Encounter (Signed)
Can you enter this in health maintenance? If not, let me know and we can ask Levander Campion how to.

## 2013-01-05 NOTE — Telephone Encounter (Signed)
I have updated this.

## 2013-03-16 ENCOUNTER — Telehealth: Payer: Self-pay | Admitting: Internal Medicine

## 2013-03-16 ENCOUNTER — Ambulatory Visit (INDEPENDENT_AMBULATORY_CARE_PROVIDER_SITE_OTHER): Payer: Medicaid Other | Admitting: Internal Medicine

## 2013-03-16 ENCOUNTER — Encounter: Payer: Self-pay | Admitting: Internal Medicine

## 2013-03-16 VITALS — BP 100/60 | HR 70 | Temp 97.9°F | Ht 65.0 in | Wt 202.5 lb

## 2013-03-16 DIAGNOSIS — I10 Essential (primary) hypertension: Secondary | ICD-10-CM

## 2013-03-16 DIAGNOSIS — E785 Hyperlipidemia, unspecified: Secondary | ICD-10-CM

## 2013-03-16 DIAGNOSIS — E119 Type 2 diabetes mellitus without complications: Secondary | ICD-10-CM

## 2013-03-16 LAB — HEMOGLOBIN A1C: Hgb A1c MFr Bld: 7.8 % — ABNORMAL HIGH (ref 4.6–6.5)

## 2013-03-16 LAB — COMPREHENSIVE METABOLIC PANEL
ALT: 13 U/L (ref 0–35)
AST: 16 U/L (ref 0–37)
Alkaline Phosphatase: 60 U/L (ref 39–117)
Chloride: 106 mEq/L (ref 96–112)
Creatinine, Ser: 1.3 mg/dL — ABNORMAL HIGH (ref 0.4–1.2)
Potassium: 4 mEq/L (ref 3.5–5.1)
Sodium: 139 mEq/L (ref 135–145)
Total Bilirubin: 0.5 mg/dL (ref 0.3–1.2)

## 2013-03-16 MED ORDER — SIMVASTATIN 20 MG PO TABS: 20.0000 mg | ORAL_TABLET | Freq: Every evening | ORAL | Status: DC

## 2013-03-16 MED ORDER — CARVEDILOL 12.5 MG PO TABS: 12.5000 mg | ORAL_TABLET | Freq: Two times a day (BID) | ORAL | Status: DC

## 2013-03-16 MED ORDER — INSULIN ASPART 100 UNIT/ML ~~LOC~~ SOLN: SUBCUTANEOUS | Status: DC

## 2013-03-16 MED ORDER — INSULIN GLARGINE 100 UNIT/ML ~~LOC~~ SOLN: 40.0000 [IU] | Freq: Every day | SUBCUTANEOUS | Status: DC

## 2013-03-16 NOTE — Progress Notes (Signed)
Subjective:    Patient ID: Chelsea Barton, female    DOB: July 08, 1960, 52 y.o.   MRN: [id number]  HPI 52 year old female with history of diabetes, hypertension, hyperlipidemia presents for followup. She reports she is generally feeling well. She did not bring record of blood sugars with her today. Her insulin regimen was recently changed by her endocrinologist with reduction in Lantus dose and increasing dose of NovoLog. She reports blood sugars have been well-controlled, typically near 100 fasting. She denies blood sugars less than 70 or greater than 250. She denies any new concerns today.  Outpatient Encounter Prescriptions as of 03/16/2013  Medication Sig Dispense Refill  . ACCU-CHEK AVIVA PLUS test strip TEST BLOOD SUGAR THREE TIMES DAILY  100 each  6  . Alcohol Swabs PADS 1 application by Does not apply route as needed.  100 each  6  . aspirin 81 MG tablet Take 81 mg by mouth daily.        . carvedilol (COREG) 12.5 MG tablet Take 1 tablet (12.5 mg total) by mouth 2 (two) times daily with a meal.  180 tablet  3  . insulin aspart (NOVOLOG) 100 UNIT/ML injection Inject 6 units at breakfast, 10 units at lunch, and 10 units at supper  1 vial  11  . insulin glargine (LANTUS) 100 UNIT/ML injection Inject 0.4 mLs (40 Units total) into the skin at bedtime.  10 mL  11  . Insulin Pen Needle (B-D ULTRAFINE III SHORT PEN) 31G X 8 MM MISC Use as directed  100 each  2  . Multiple Vitamin (MULTIVITAMIN) capsule Take 1 capsule by mouth daily.        . simvastatin (ZOCOR) 20 MG tablet Take 1 tablet (20 mg total) by mouth every evening.  90 tablet  4  . vitamin D, CHOLECALCIFEROL, 400 UNITS tablet Take 400 Units by mouth 2 (two) times daily.         No facility-administered encounter medications on file as of 03/16/2013.   BP 100/60  Pulse 70  Temp(Src) 97.9 F (36.6 C) (Oral)  Ht 5\' 5"  (1.651 m)  Wt 202 lb 8 oz (91.853 kg)  BMI 33.7 kg/m2  SpO2 92%  Review of Systems  Constitutional: Negative for  fever, chills, appetite change, fatigue and unexpected weight change.  HENT: Negative for ear pain, congestion, sore throat, trouble swallowing, neck pain, voice change and sinus pressure.   Eyes: Negative for visual disturbance.  Respiratory: Negative for cough, shortness of breath, wheezing and stridor.   Cardiovascular: Negative for chest pain, palpitations and leg swelling.  Gastrointestinal: Negative for nausea, vomiting, abdominal pain, diarrhea, constipation, blood in stool, abdominal distention and anal bleeding.  Genitourinary: Negative for dysuria and flank pain.  Musculoskeletal: Negative for myalgias, arthralgias and gait problem.  Skin: Negative for color change and rash.  Neurological: Negative for dizziness and headaches.  Hematological: Negative for adenopathy. Does not bruise/bleed easily.  Psychiatric/Behavioral: Negative for suicidal ideas, sleep disturbance and dysphoric mood. The patient is not nervous/anxious.        Objective:   Physical Exam  Constitutional: She is oriented to person, place, and time. She appears well-developed and well-nourished. No distress.  HENT:  Head: Normocephalic and atraumatic.  Right Ear: External ear normal.  Left Ear: External ear normal.  Nose: Nose normal.  Mouth/Throat: Oropharynx is clear and moist. No oropharyngeal exudate.  Eyes: Conjunctivae are normal. Pupils are equal, round, and reactive to light. Right eye exhibits no discharge. Left eye exhibits no  discharge. No scleral icterus.  Neck: Normal range of motion. Neck supple. No tracheal deviation present. No thyromegaly present.  Cardiovascular: Normal rate, regular rhythm, normal heart sounds and intact distal pulses.  Exam reveals no gallop and no friction rub.   No murmur heard. Pulmonary/Chest: Effort normal and breath sounds normal. No accessory muscle usage. Not tachypneic. No respiratory distress. She has no decreased breath sounds. She has no wheezes. She has no  rhonchi. She has no rales. She exhibits no tenderness.  Musculoskeletal: Normal range of motion. She exhibits no edema and no tenderness.  Lymphadenopathy:    She has no cervical adenopathy.  Neurological: She is alert and oriented to person, place, and time. No cranial nerve deficit. She exhibits normal muscle tone. Coordination normal.  Skin: Skin is warm and dry. No rash noted. She is not diaphoretic. No erythema. No pallor.  Psychiatric: She has a normal mood and affect. Her behavior is normal. Judgment and thought content normal.          Assessment & Plan:

## 2013-03-16 NOTE — Telephone Encounter (Signed)
Pharmacy Note:  Lantus U-100 insulin 46mL  Patient uses pens would you please resend a new Rx??

## 2013-03-16 NOTE — Assessment & Plan Note (Signed)
Will check lipids and LFTs with labs today. Continue simvastatin. 

## 2013-03-16 NOTE — Assessment & Plan Note (Signed)
Patient reports better control of blood sugars over the last few weeks. Will check A1c with labs today. Continue current medications.

## 2013-03-16 NOTE — Assessment & Plan Note (Signed)
BP Readings from Last 3 Encounters:  03/16/13 100/60  12/14/12 140/74  09/10/12 162/80   Blood pressure well-controlled on carvedilol. Reviewed notes from her nephrologist. Pt was also to be taking Enalapril. Unclear why she stopped this medication. Will try to confirm with them. Follow up 3 months andprn.

## 2013-03-16 NOTE — Telephone Encounter (Signed)
Chelsea Barton - Can you call in the Lantus pens for her same dose?

## 2013-03-16 NOTE — Telephone Encounter (Signed)
Based on her kidney doctor's notes, she is also supposed to be taking Enalapril. Can you confirm with her why she is not taking this medication.

## 2013-03-17 MED ORDER — INSULIN GLARGINE 100 UNIT/ML SOLOSTAR PEN: 40.0000 [IU] | PEN_INJECTOR | Freq: Every day | SUBCUTANEOUS | Status: DC

## 2013-03-17 NOTE — Telephone Encounter (Signed)
Prescription sent to the pharmacy and left message on patient voicemail to call back

## 2013-03-23 NOTE — Telephone Encounter (Signed)
Left message to call back with female that answered the phone.

## 2013-03-25 ENCOUNTER — Encounter: Payer: Self-pay | Admitting: *Deleted

## 2013-03-25 NOTE — Telephone Encounter (Signed)
Patient never returned call, letter mailed to patient home address on file.

## 2013-04-02 ENCOUNTER — Telehealth: Payer: Self-pay | Admitting: *Deleted

## 2013-04-02 NOTE — Telephone Encounter (Signed)
Patient received a letter about her taking Enalapril, she is not currently taking. But state if this is something she needs to be taking then please call it in to the pharmacy. Call it in to the [facility] on [address] in [place]. Please refer to previous encounter, letter sent to patient home address because she would not return phone calls.

## 2013-04-02 NOTE — Telephone Encounter (Signed)
She does need to be taking it. Unless, Dr. Nehemiah Massed stopped the medication for some reason. We can call in refill for her. If she has not been taking in awhile, then will need BMP 1 week after restarting.

## 2013-04-05 MED ORDER — ENALAPRIL MALEATE 5 MG PO TABS: 5.0000 mg | ORAL_TABLET | Freq: Every day | ORAL | Status: DC

## 2013-04-05 NOTE — Telephone Encounter (Signed)
Spoke with patient and she is not sure what happened with the Enalapril. Per patient when she went to get a refill there were no refills so she has not taken it about month. Refill sent to pharmacy and patient lab appointment scheduled for next week.

## 2013-04-05 NOTE — Telephone Encounter (Signed)
Enalapril 5mg  daily.

## 2013-04-05 NOTE — Telephone Encounter (Signed)
Sent to pharmacy 

## 2013-04-05 NOTE — Telephone Encounter (Signed)
What strength does she need to be on?

## 2013-04-14 ENCOUNTER — Telehealth: Payer: Self-pay | Admitting: *Deleted

## 2013-04-14 NOTE — Telephone Encounter (Signed)
Pt is coming in for labs tomorrow what labs and dX?

## 2013-04-14 NOTE — Telephone Encounter (Signed)
Pt is coming in for labs tomorrow what labs and dx?  

## 2013-04-14 NOTE — Telephone Encounter (Signed)
CMP, A1c, lipids, urine microalbumin 250.00, 272.4

## 2013-04-15 ENCOUNTER — Other Ambulatory Visit (INDEPENDENT_AMBULATORY_CARE_PROVIDER_SITE_OTHER): Payer: Medicaid Other

## 2013-04-15 ENCOUNTER — Other Ambulatory Visit: Payer: Self-pay | Admitting: *Deleted

## 2013-04-15 DIAGNOSIS — E785 Hyperlipidemia, unspecified: Secondary | ICD-10-CM

## 2013-04-15 DIAGNOSIS — E119 Type 2 diabetes mellitus without complications: Secondary | ICD-10-CM

## 2013-04-15 LAB — COMPREHENSIVE METABOLIC PANEL
Albumin: 3.5 g/dL (ref 3.5–5.2)
Alkaline Phosphatase: 65 U/L (ref 39–117)
CO2: 29 mEq/L (ref 19–32)
Calcium: 9.8 mg/dL (ref 8.4–10.5)
Chloride: 104 mEq/L (ref 96–112)
Creatinine, Ser: 1 mg/dL (ref 0.4–1.2)
GFR: 75.27 mL/min (ref >60.00)
Glucose, Bld: 50 mg/dL — ABNORMAL LOW (ref 70–99)
Potassium: 4.1 mEq/L (ref 3.5–5.1)
Sodium: 141 mEq/L (ref 135–145)
Total Protein: 7.2 g/dL (ref 6.0–8.3)

## 2013-04-15 LAB — LIPID PANEL
HDL: 52.9 mg/dL (ref >39.00)
Total CHOL/HDL Ratio: 3
VLDL: 23.4 mg/dL (ref 0.0–40.0)

## 2013-04-15 LAB — MICROALBUMIN / CREATININE URINE RATIO
Creatinine,U: 66.6 mg/dL
Microalb, Ur: 49.7 mg/dL — ABNORMAL HIGH (ref 0.0–1.9)

## 2013-04-22 ENCOUNTER — Other Ambulatory Visit: Payer: Self-pay | Admitting: Internal Medicine

## 2013-05-24 ENCOUNTER — Telehealth: Payer: Self-pay | Admitting: *Deleted

## 2013-05-24 NOTE — Telephone Encounter (Signed)
Amy from [facility] left a message requesting continuing services for patient. She is up for re-certification and would like to get this done, can leave a detailed message on her voicemail. Called and spoke with Amy, verbal ok given to continue services for patient

## 2013-06-15 ENCOUNTER — Ambulatory Visit: Payer: Medicaid Other | Admitting: Internal Medicine

## 2013-06-18 ENCOUNTER — Other Ambulatory Visit: Payer: Self-pay | Admitting: Internal Medicine

## 2013-06-21 ENCOUNTER — Encounter: Payer: Self-pay | Admitting: Internal Medicine

## 2013-06-21 ENCOUNTER — Ambulatory Visit (INDEPENDENT_AMBULATORY_CARE_PROVIDER_SITE_OTHER): Payer: Medicaid Other | Admitting: Internal Medicine

## 2013-06-21 VITALS — BP 160/88 | HR 74 | Temp 97.7°F | Wt 204.0 lb

## 2013-06-21 DIAGNOSIS — E119 Type 2 diabetes mellitus without complications: Secondary | ICD-10-CM

## 2013-06-21 DIAGNOSIS — I10 Essential (primary) hypertension: Secondary | ICD-10-CM

## 2013-06-21 DIAGNOSIS — E785 Hyperlipidemia, unspecified: Secondary | ICD-10-CM

## 2013-06-21 LAB — COMPREHENSIVE METABOLIC PANEL
ALT: 13 U/L (ref 0–35)
AST: 16 U/L (ref 0–37)
Albumin: 3.4 g/dL — ABNORMAL LOW (ref 3.5–5.2)
Alkaline Phosphatase: 51 U/L (ref 39–117)
BUN: 16 mg/dL (ref 6–23)
CALCIUM: 9.2 mg/dL (ref 8.4–10.5)
CO2: 30 meq/L (ref 19–32)
CREATININE: 1.1 mg/dL (ref 0.4–1.2)
Chloride: 104 mEq/L (ref 96–112)
GFR: 67.32 mL/min (ref >60.00)
Glucose, Bld: 103 mg/dL — ABNORMAL HIGH (ref 70–99)
Potassium: 4.3 mEq/L (ref 3.5–5.1)
SODIUM: 141 meq/L (ref 135–145)
Total Bilirubin: 0.5 mg/dL (ref 0.3–1.2)
Total Protein: 6.8 g/dL (ref 6.0–8.3)

## 2013-06-21 LAB — MICROALBUMIN / CREATININE URINE RATIO
Creatinine,U: 110.6 mg/dL
Microalb Creat Ratio: 62.1 mg/g — ABNORMAL HIGH (ref 0.0–30.0)
Microalb, Ur: 68.7 mg/dL — ABNORMAL HIGH (ref 0.0–1.9)

## 2013-06-21 LAB — HEMOGLOBIN A1C: Hgb A1c MFr Bld: 7.6 % — ABNORMAL HIGH (ref 4.6–6.5)

## 2013-06-21 NOTE — Assessment & Plan Note (Signed)
Lipids normal today. Continue simvastatin.

## 2013-06-21 NOTE — Assessment & Plan Note (Signed)
Lab Results  Component Value Date   HGBA1C 7.6* 06/21/2013   Blood sugar well-controlled based on A1c however patient has been having some low morning readings. We discussed reducing her dose of Lantus to 35 units should she have any additional blood sugars less than 70. She will also followup with her endocrinologist next week.

## 2013-06-21 NOTE — Progress Notes (Signed)
Pre-visit discussion using our clinic review tool. No additional management support is needed unless otherwise documented below in the visit note.  

## 2013-06-21 NOTE — Progress Notes (Signed)
Subjective:    Patient ID: Chelsea Barton, female    DOB: September 01, 1960, 53 y.o.   MRN: [id number]  HPI 53 year old female with history of diabetes, hypertension, hyperlipidemia presents for followup. She reports she has been feeling well. She reports blood pressure at home has mostly been near 120/80. She denies any headache, palpitations, shortness of breath, lower extremities edema. She has been having some trouble with her blood sugars. She is having morning blood sugars often near 70 or even less than 70. She is symptomatic with these blood sugars with diaphoresis. The low blood sugar resolves with drinking Coke. The lower she has seen was 58. She has been taking Lantus 40 units daily and taking NovoLog with meals.  Outpatient Prescriptions Prior to Visit  Medication Sig Dispense Refill  . ACCU-CHEK AVIVA PLUS test strip TEST BLOOD SUGAR THREE TIMES DAILY  100 each  6  . Alcohol Swabs PADS 1 application by Does not apply route as needed.  100 each  6  . aspirin 81 MG tablet Take 81 mg by mouth daily.        . B-D ULTRAFINE III SHORT PEN 31G X 8 MM MISC USE AS DIRECTED  100 each  0  . carvedilol (COREG) 12.5 MG tablet Take 1 tablet (12.5 mg total) by mouth 2 (two) times daily with a meal.  180 tablet  3  . enalapril (VASOTEC) 5 MG tablet Take 1 tablet (5 mg total) by mouth daily.  30 tablet  5  . insulin aspart (NOVOLOG) 100 UNIT/ML injection Inject 6 units at breakfast, 10 units at lunch, and 10 units at supper  1 vial  11  . Insulin Glargine (LANTUS SOLOSTAR) 100 UNIT/ML SOPN Inject 40 Units into the skin at bedtime.  10 pen  6  . Multiple Vitamin (MULTIVITAMIN) capsule Take 1 capsule by mouth daily.        . simvastatin (ZOCOR) 20 MG tablet Take 1 tablet (20 mg total) by mouth every evening.  90 tablet  4  . vitamin D, CHOLECALCIFEROL, 400 UNITS tablet Take 400 Units by mouth 2 (two) times daily.         No facility-administered medications prior to visit.   BP 160/88  Pulse 74   Temp(Src) 97.7 F (36.5 C) (Oral)  Wt 204 lb (92.534 kg)  SpO2 95%  Review of Systems  Constitutional: Negative for fever, chills, appetite change, fatigue and unexpected weight change.  HENT: Negative for congestion, ear pain, sinus pressure, sore throat, trouble swallowing and voice change.   Eyes: Negative for visual disturbance.  Respiratory: Negative for cough, shortness of breath, wheezing and stridor.   Cardiovascular: Negative for chest pain, palpitations and leg swelling.  Gastrointestinal: Negative for nausea, vomiting, abdominal pain, diarrhea, constipation, blood in stool, abdominal distention and anal bleeding.  Genitourinary: Negative for dysuria and flank pain.  Musculoskeletal: Negative for arthralgias, gait problem, myalgias and neck pain.  Skin: Negative for color change and rash.  Neurological: Negative for dizziness and headaches.  Hematological: Negative for adenopathy. Does not bruise/bleed easily.  Psychiatric/Behavioral: Negative for suicidal ideas, sleep disturbance and dysphoric mood. The patient is not nervous/anxious.        Objective:   Physical Exam  Constitutional: She is oriented to person, place, and time. She appears well-developed and well-nourished. No distress.  HENT:  Head: Normocephalic and atraumatic.  Right Ear: External ear normal.  Left Ear: External ear normal.  Nose: Nose normal.  Mouth/Throat: Oropharynx is clear and  moist. No oropharyngeal exudate.  Eyes: Conjunctivae are normal. Pupils are equal, round, and reactive to light. Right eye exhibits no discharge. Left eye exhibits no discharge. No scleral icterus.  Neck: Normal range of motion. Neck supple. No tracheal deviation present. No thyromegaly present.  Cardiovascular: Normal rate, regular rhythm, normal heart sounds and intact distal pulses.  Exam reveals no gallop and no friction rub.   No murmur heard. Pulmonary/Chest: Effort normal and breath sounds normal. No accessory muscle  usage. Not tachypneic. No respiratory distress. She has no decreased breath sounds. She has no wheezes. She has no rhonchi. She has no rales. She exhibits no tenderness.  Musculoskeletal: Normal range of motion. She exhibits no edema and no tenderness.  Lymphadenopathy:    She has no cervical adenopathy.  Neurological: She is alert and oriented to person, place, and time. No cranial nerve deficit. She exhibits normal muscle tone. Coordination normal.  Skin: Skin is warm and dry. No rash noted. She is not diaphoretic. No erythema. No pallor.  Psychiatric: She has a normal mood and affect. Her behavior is normal. Judgment and thought content normal.          Assessment & Plan:

## 2013-06-21 NOTE — Patient Instructions (Signed)
Please monitor blood sugars carefully 2-3 times daily.  If ANY more blood sugars less than 70, please decrease Lantus to 35units daily.

## 2013-06-21 NOTE — Assessment & Plan Note (Signed)
BP Readings from Last 3 Encounters:  06/21/13 160/88  03/16/13 100/60  12/14/12 140/74   Blood sugar elevated today however patient notes recent increased intake of salty foods and blood pressure at home has been normal. We'll continue to monitor for home health. Home health nurse will call if blood pressure consistently greater than 140/90.

## 2013-07-02 ENCOUNTER — Other Ambulatory Visit: Payer: Self-pay | Admitting: Internal Medicine

## 2013-07-09 ENCOUNTER — Telehealth: Payer: Self-pay | Admitting: Internal Medicine

## 2013-07-09 NOTE — Telephone Encounter (Signed)
Pt states she ran out of her simvastatin.  Was told she cannot get it refilled until February.  States she is unsure why she ran out.  Needs refill.

## 2013-07-09 NOTE — Telephone Encounter (Signed)
Patient informed to call her pharmacy back, she not sure how she ran out of medication.

## 2013-07-26 ENCOUNTER — Telehealth: Payer: Self-pay | Admitting: *Deleted

## 2013-07-26 NOTE — Telephone Encounter (Signed)
Amy left message requesting verbal ok for patient. Time for her to re-certify with [facility] to go out bi-weekly to fill patient pill box and it is ok to leave message on her voicemail. Called left verbal ok on Amy's voicemail.

## 2013-07-29 ENCOUNTER — Encounter: Payer: Self-pay | Admitting: Podiatrist

## 2013-07-29 ENCOUNTER — Ambulatory Visit (INDEPENDENT_AMBULATORY_CARE_PROVIDER_SITE_OTHER): Payer: Medicaid Other | Admitting: Podiatrist

## 2013-07-29 VITALS — BP 96/46 | HR 75 | Resp 16 | Ht 65.0 in | Wt 198.0 lb

## 2013-07-29 DIAGNOSIS — B351 Tinea unguium: Secondary | ICD-10-CM

## 2013-07-29 DIAGNOSIS — M79609 Pain in unspecified limb: Secondary | ICD-10-CM

## 2013-07-29 DIAGNOSIS — E119 Type 2 diabetes mellitus without complications: Secondary | ICD-10-CM

## 2013-07-30 NOTE — Progress Notes (Signed)
HPI:  Patient presents today for follow up of foot and nail care. Denies any new complaints today.  Objective:  Patients chart is reviewed.  Vascular status reveals pedal pulses noted at 2 out of 4 dp and pt bilateral .  Neurological sensation is Decreased to [employer] monofilament bilateral at 3/5 sites.  Patients nails are thickened, discolored, distrophic, friable and brittle with yellow-brown discoloration. Patient subjectively relates they are painful with shoes and with ambulation of bilateral feet.  Assessment:  Symptomatic onychomycosis  Plan:  Discussed treatment options and alternatives.  The symptomatic toenails were debrided through manual an mechanical means without complication.  Return appointment recommended at routine intervals of 3 months    Trudie Buckler, DPM

## 2013-08-27 ENCOUNTER — Other Ambulatory Visit: Payer: Self-pay | Admitting: Internal Medicine

## 2013-09-14 ENCOUNTER — Telehealth: Payer: Self-pay | Admitting: Internal Medicine

## 2013-09-14 DIAGNOSIS — Z1239 Encounter for other screening for malignant neoplasm of breast: Secondary | ICD-10-CM

## 2013-09-14 NOTE — Telephone Encounter (Signed)
Pt states she was to have a mammogram scheduled.  No order in.  Pt states she needs a Thursday or Friday appt after 9 a.m.

## 2013-09-15 ENCOUNTER — Encounter: Payer: Self-pay | Admitting: *Deleted

## 2013-09-15 NOTE — Telephone Encounter (Signed)
Fwd to [facility]

## 2013-09-15 NOTE — Telephone Encounter (Signed)
ORder placed.

## 2013-09-15 NOTE — Telephone Encounter (Signed)
Could you please put in a referral for this?

## 2013-09-23 ENCOUNTER — Encounter: Payer: Self-pay | Admitting: Emergency Medicine

## 2013-09-23 NOTE — Telephone Encounter (Signed)
Amber scheduled the patient.

## 2013-09-24 DIAGNOSIS — E1139 Type 2 diabetes mellitus with other diabetic ophthalmic complication: Secondary | ICD-10-CM

## 2013-09-24 DIAGNOSIS — E11319 Type 2 diabetes mellitus with unspecified diabetic retinopathy without macular edema: Secondary | ICD-10-CM

## 2013-09-24 DIAGNOSIS — R32 Unspecified urinary incontinence: Secondary | ICD-10-CM

## 2013-09-24 DIAGNOSIS — N390 Urinary tract infection, site not specified: Secondary | ICD-10-CM

## 2013-09-27 ENCOUNTER — Encounter: Payer: Self-pay | Admitting: Internal Medicine

## 2013-09-27 ENCOUNTER — Ambulatory Visit (INDEPENDENT_AMBULATORY_CARE_PROVIDER_SITE_OTHER): Payer: Medicaid Other | Admitting: Internal Medicine

## 2013-09-27 ENCOUNTER — Telehealth: Payer: Self-pay | Admitting: Internal Medicine

## 2013-09-27 VITALS — BP 178/98 | HR 72 | Temp 97.8°F | Wt 200.0 lb

## 2013-09-27 DIAGNOSIS — E785 Hyperlipidemia, unspecified: Secondary | ICD-10-CM

## 2013-09-27 DIAGNOSIS — E119 Type 2 diabetes mellitus without complications: Secondary | ICD-10-CM

## 2013-09-27 DIAGNOSIS — I10 Essential (primary) hypertension: Secondary | ICD-10-CM

## 2013-09-27 LAB — COMPREHENSIVE METABOLIC PANEL
ALBUMIN: 3.3 g/dL — AB (ref 3.5–5.2)
ALT: 14 U/L (ref 0–35)
AST: 18 U/L (ref 0–37)
Alkaline Phosphatase: 58 U/L (ref 39–117)
BUN: 19 mg/dL (ref 6–23)
CALCIUM: 9.8 mg/dL (ref 8.4–10.5)
CHLORIDE: 102 meq/L (ref 96–112)
CO2: 30 meq/L (ref 19–32)
Creatinine, Ser: 1 mg/dL (ref 0.4–1.2)
GFR: 71.79 mL/min (ref >60.00)
Glucose, Bld: 215 mg/dL — ABNORMAL HIGH (ref 70–99)
POTASSIUM: 5.1 meq/L (ref 3.5–5.1)
Sodium: 141 mEq/L (ref 135–145)
Total Bilirubin: 0.3 mg/dL (ref 0.3–1.2)
Total Protein: 6.8 g/dL (ref 6.0–8.3)

## 2013-09-27 LAB — LIPID PANEL
CHOL/HDL RATIO: 3
Cholesterol: 150 mg/dL (ref 0–200)
HDL: 48.4 mg/dL (ref >39.00)
LDL Cholesterol: 75 mg/dL (ref 0–99)
Triglycerides: 132 mg/dL (ref 0.0–149.0)
VLDL: 26.4 mg/dL (ref 0.0–40.0)

## 2013-09-27 LAB — HEMOGLOBIN A1C: Hgb A1c MFr Bld: 7.9 % — ABNORMAL HIGH (ref 4.6–6.5)

## 2013-09-27 LAB — MICROALBUMIN / CREATININE URINE RATIO
CREATININE, U: 131.2 mg/dL
MICROALB/CREAT RATIO: 94.9 mg/g — AB (ref 0.0–30.0)
Microalb, Ur: 124.5 mg/dL — ABNORMAL HIGH (ref 0.0–1.9)

## 2013-09-27 MED ORDER — INSULIN PEN NEEDLE 31G X 8 MM MISC: Status: DC

## 2013-09-27 NOTE — Telephone Encounter (Signed)
I have tried to reach this pt. Her mailbox is full. Mammogram has been scheduled for 10/06/13 at 140pm

## 2013-09-27 NOTE — Assessment & Plan Note (Signed)
Will check lipids with labs today. Continue Simvastatin. 

## 2013-09-27 NOTE — Assessment & Plan Note (Signed)
BG well controlled per pt report. Will continue current medications. Check A1c with labs today.

## 2013-09-27 NOTE — Assessment & Plan Note (Signed)
BP Readings from Last 3 Encounters:  09/27/13 178/98  07/29/13 96/46  06/21/13 160/88   BP elevated today, however has been well controlled at home. Will have pt continue to monitor BP with home health nurse. Call if BP consistently >140/90.

## 2013-09-27 NOTE — Progress Notes (Signed)
Subjective:    Patient ID: Chelsea Barton, female    DOB: [birth year], 53 y.o.   MRN: [id number]  HPI 53YO female presents for follow up  DM - BG running low at times. 41 this morning Compliant with meds. Symptomatic with lows. Eats candy with resolution. Most sugars near 100.  HTN - BP at home running 130s/70s when checked by nurse. Compliant with meds. Did eat pork ribs yesterday.  Review of Systems  Constitutional: Negative for fever, chills, appetite change, fatigue and unexpected weight change.  HENT: Negative for congestion, ear pain, sinus pressure, sore throat, trouble swallowing and voice change.   Eyes: Negative for visual disturbance.  Respiratory: Negative for cough, shortness of breath, wheezing and stridor.   Cardiovascular: Negative for chest pain, palpitations and leg swelling.  Gastrointestinal: Negative for nausea, vomiting, abdominal pain, diarrhea, constipation, blood in stool, abdominal distention and anal bleeding.  Genitourinary: Negative for dysuria and flank pain.  Musculoskeletal: Negative for arthralgias, gait problem, myalgias and neck pain.  Skin: Negative for color change and rash.  Neurological: Negative for dizziness and headaches.  Hematological: Negative for adenopathy. Does not bruise/bleed easily.  Psychiatric/Behavioral: Negative for suicidal ideas, sleep disturbance and dysphoric mood. The patient is not nervous/anxious.        Objective:    BP 178/98  Pulse 72  Temp(Src) 97.8 F (36.6 C) (Oral)  Wt 200 lb (90.719 kg)  SpO2 93% Physical Exam  Constitutional: She is oriented to person, place, and time. She appears well-developed and well-nourished. No distress.  HENT:  Head: Normocephalic and atraumatic.  Right Ear: External ear normal.  Left Ear: External ear normal.  Nose: Nose normal.  Mouth/Throat: Oropharynx is clear and moist. No oropharyngeal exudate.  Eyes: Conjunctivae are normal. Pupils are equal, round, and reactive to light.  Right eye exhibits no discharge. Left eye exhibits no discharge. No scleral icterus.  Neck: Normal range of motion. Neck supple. No tracheal deviation present. No thyromegaly present.  Cardiovascular: Normal rate, regular rhythm, normal heart sounds and intact distal pulses.  Exam reveals no gallop and no friction rub.   No murmur heard. Pulmonary/Chest: Effort normal and breath sounds normal. No accessory muscle usage. Not tachypneic. No respiratory distress. She has no decreased breath sounds. She has no wheezes. She has no rhonchi. She has no rales. She exhibits no tenderness.  Musculoskeletal: Normal range of motion. She exhibits no edema and no tenderness.  Lymphadenopathy:    She has no cervical adenopathy.  Neurological: She is alert and oriented to person, place, and time. No cranial nerve deficit. She exhibits normal muscle tone. Coordination normal.  Skin: Skin is warm and dry. No rash noted. She is not diaphoretic. No erythema. No pallor.  Psychiatric: She has a normal mood and affect. Her behavior is normal. Judgment and thought content normal.          Assessment & Plan:   Problem List Items Addressed This Visit   Diabetes mellitus type 2, controlled - Primary     BG well controlled per pt report. Will continue current medications. Check A1c with labs today.    Relevant Orders      Comprehensive metabolic panel      Hemoglobin A1c      Lipid panel      Microalbumin / creatinine urine ratio   Hyperlipidemia     Will check lipids with labs today. Continue Simvastatin.    Hypertension      BP Readings from Last  3 Encounters:  09/27/13 178/98  07/29/13 96/46  06/21/13 160/88   BP elevated today, however has been well controlled at home. Will have pt continue to monitor BP with home health nurse. Call if BP consistently >140/90.        Return in about 3 months (around 12/27/2013) for Physical.

## 2013-09-27 NOTE — Telephone Encounter (Signed)
Needs mammogram scheduled per checkout paperwork.  Pt would like a call, did not want to wait.

## 2013-09-27 NOTE — Patient Instructions (Signed)
Continue to monitor blood pressure at home. Call if blood pressure consistently >140/90.

## 2013-09-28 ENCOUNTER — Encounter: Payer: Self-pay | Admitting: *Deleted

## 2013-10-12 ENCOUNTER — Telehealth: Payer: Self-pay

## 2013-10-12 NOTE — Telephone Encounter (Signed)
Relevant patient education mailed to patient.  

## 2013-11-11 ENCOUNTER — Ambulatory Visit (INDEPENDENT_AMBULATORY_CARE_PROVIDER_SITE_OTHER): Payer: Medicaid Other | Admitting: Podiatrist

## 2013-11-11 VITALS — BP 73/53 | HR 73 | Resp 16

## 2013-11-11 DIAGNOSIS — B351 Tinea unguium: Secondary | ICD-10-CM

## 2013-11-11 DIAGNOSIS — M79609 Pain in unspecified limb: Secondary | ICD-10-CM

## 2013-11-11 DIAGNOSIS — E119 Type 2 diabetes mellitus without complications: Secondary | ICD-10-CM

## 2013-11-11 MED ORDER — SILVER SULFADIAZINE 1 % EX CREA: 1.0000 "application " | TOPICAL_CREAM | Freq: Every day | CUTANEOUS | Status: DC

## 2013-11-11 NOTE — Progress Notes (Signed)
HPI: Patient presents today for follow up of foot and nail care. Denies any new complaints today.  New area of skin blistering left 5th toe-- she relates she dropped hot water on her fot  Objective: Patients chart is reviewed. Vascular status reveals pedal pulses noted at 2 out of 4 dp and pt bilateral . Neurological sensation is Decreased to [employer] monofilament bilateral at 3/5 sites. Patients nails are thickened, discolored, distrophic, friable and brittle with yellow-brown discoloration. Patient subjectively relates they are painful with shoes and with ambulation of bilateral feet. Blistering is present however it appears to be healing nicely. Assessment: Symptomatic onychomycosis burn injury left fifth toe Plan: Discussed treatment options and alternatives. The symptomatic toenails were debrided through manual an mechanical means without complication. Ment Silvadene cream and this was called into her pharmacy. Return appointment recommended at routine intervals of 3 months  Trudie Buckler, DPM

## 2013-12-03 ENCOUNTER — Other Ambulatory Visit: Payer: Self-pay | Admitting: Internal Medicine

## 2013-12-13 LAB — HM PAP SMEAR: HM PAP: NEGATIVE

## 2014-01-07 ENCOUNTER — Encounter: Payer: Medicaid Other | Admitting: Internal Medicine

## 2014-01-20 LAB — BASIC METABOLIC PANEL
BUN: 25 mg/dL — AB (ref 4–21)
Creatinine: 1.3 mg/dL — AB (ref 0.5–1.1)
Glucose: 120 mg/dL
POTASSIUM: 4.7 mmol/L (ref 3.4–5.3)
Sodium: 137 mmol/L (ref 137–147)

## 2014-01-20 LAB — HEMOGLOBIN A1C: Hgb A1c MFr Bld: 7.5 % — AB (ref 4.0–6.0)

## 2014-01-21 ENCOUNTER — Encounter: Payer: Self-pay | Admitting: *Deleted

## 2014-02-25 ENCOUNTER — Encounter: Payer: Medicaid Other | Admitting: Internal Medicine

## 2014-03-15 LAB — HM DIABETES EYE EXAM

## 2014-03-15 LAB — HM DIABETES FOOT EXAM

## 2014-03-28 ENCOUNTER — Other Ambulatory Visit: Payer: Self-pay | Admitting: Internal Medicine

## 2014-03-31 ENCOUNTER — Ambulatory Visit (INDEPENDENT_AMBULATORY_CARE_PROVIDER_SITE_OTHER): Payer: Medicaid Other | Admitting: Podiatry

## 2014-03-31 DIAGNOSIS — B351 Tinea unguium: Secondary | ICD-10-CM

## 2014-03-31 DIAGNOSIS — M79676 Pain in unspecified toe(s): Secondary | ICD-10-CM

## 2014-03-31 DIAGNOSIS — E119 Type 2 diabetes mellitus without complications: Secondary | ICD-10-CM

## 2014-03-31 NOTE — Patient Instructions (Signed)
Diabetes and Foot Care Diabetes may cause you to have problems because of poor blood supply (circulation) to your feet and legs. This may cause the skin on your feet to become thinner, break easier, and heal more slowly. Your skin may become dry, and the skin may peel and crack. You may also have nerve damage in your legs and feet causing decreased feeling in them. You may not notice minor injuries to your feet that could lead to infections or more serious problems. Taking care of your feet is one of the most important things you can do for yourself.  HOME CARE INSTRUCTIONS  Wear shoes at all times, even in the house. Do not go barefoot. Bare feet are easily injured.  Check your feet daily for blisters, cuts, and redness. If you cannot see the bottom of your feet, use a mirror or ask someone for help.  Wash your feet with warm water (do not use hot water) and mild soap. Then pat your feet and the areas between your toes until they are completely dry. Do not soak your feet as this can dry your skin.  Apply a moisturizing lotion or petroleum jelly (that does not contain alcohol and is unscented) to the skin on your feet and to dry, brittle toenails. Do not apply lotion between your toes.  Trim your toenails straight across. Do not dig under them or around the cuticle. File the edges of your nails with an emery board or nail file.  Do not cut corns or calluses or try to remove them with medicine.  Wear clean socks or stockings every day. Make sure they are not too tight. Do not wear knee-high stockings since they may decrease blood flow to your legs.  Wear shoes that fit properly and have enough cushioning. To break in new shoes, wear them for just a few hours a day. This prevents you from injuring your feet. Always look in your shoes before you put them on to be sure there are no objects inside.  Do not cross your legs. This may decrease the blood flow to your feet.  If you find a minor scrape,  cut, or break in the skin on your feet, keep it and the skin around it clean and dry. These areas may be cleansed with mild soap and water. Do not cleanse the area with peroxide, alcohol, or iodine.  When you remove an adhesive bandage, be sure not to damage the skin around it.  If you have a wound, look at it several times a day to make sure it is healing.  Do not use heating pads or hot water bottles. They may burn your skin. If you have lost feeling in your feet or legs, you may not know it is happening until it is too late.  Make sure your health care provider performs a complete foot exam at least annually or more often if you have foot problems. Report any cuts, sores, or bruises to your health care provider immediately. SEEK MEDICAL CARE IF:   You have an injury that is not healing.  You have cuts or breaks in the skin.  You have an ingrown nail.  You notice redness on your legs or feet.  You feel burning or tingling in your legs or feet.  You have pain or cramps in your legs and feet.  Your legs or feet are numb.  Your feet always feel cold. SEEK IMMEDIATE MEDICAL CARE IF:   There is increasing redness,   swelling, or pain in or around a wound.  There is a red line that goes up your leg.  Pus is coming from a wound.  You develop a fever or as directed by your health care provider.  You notice a bad smell coming from an ulcer or wound. Document Released: 05/31/2000 Document Revised: 02/03/2013 Document Reviewed: 11/10/2012 Exit[employer] Patient Information 2015 [employer]. This information is not intended to replace advice given to you by your health care provider. Make sure you discuss any questions you have with your health care provider.  

## 2014-04-01 ENCOUNTER — Other Ambulatory Visit: Payer: Self-pay | Admitting: Internal Medicine

## 2014-04-03 NOTE — Progress Notes (Signed)
Patient ID: MAHUM GRABEN, female   DOB: November 28, 1960, 53 y.o.   MRN: [id number]  Subjective: Ms. Kritikos returns the office they for diabetic risk assessment and painful elongated nails. She states that she is diabetic and her last blood sugar was 122 this morning. She has a history of ulceration to the lateral aspect of the right foot which is well-healed. She denies any tingling or numbness or any claudication symptoms. No other complaints at this time.  Objective: AAO x3, NAD DP/PT pulses palpable bilaterally, CRT less than 3 seconds Protective sensation decreased with [employer] monofilament, Achilles tendon reflex intact. Nails hypertrophic, dystrophic, elongated, brittle, yellow discoloration bilaterally. No surrounding erythema or drainage. No open lesions or pre-ulcerative lesions. Scar over the lateral aspect of the right foot from prior ulceration which is well-healed. No calf pain, swelling, warmth.  Assessment: 53 year old female with symptomatic onychomycosis  Plan: -Treatment options discussed including alternatives, risks, complications. -Nail sharply debrided without complications [id number] -Discussed the importance of daily foot inspection. -Followup in 3 months or sooner if any to arise or any changes symptoms. In the meantime call the office with any questions, concerns

## 2014-04-14 ENCOUNTER — Ambulatory Visit (INDEPENDENT_AMBULATORY_CARE_PROVIDER_SITE_OTHER): Payer: Medicaid Other | Admitting: Internal Medicine

## 2014-04-14 ENCOUNTER — Encounter: Payer: Self-pay | Admitting: Internal Medicine

## 2014-04-14 VITALS — BP 118/70 | HR 71 | Temp 97.9°F | Ht 66.25 in | Wt 200.0 lb

## 2014-04-14 DIAGNOSIS — E119 Type 2 diabetes mellitus without complications: Secondary | ICD-10-CM

## 2014-04-14 DIAGNOSIS — Z0000 Encounter for general adult medical examination without abnormal findings: Secondary | ICD-10-CM

## 2014-04-14 DIAGNOSIS — E669 Obesity, unspecified: Secondary | ICD-10-CM

## 2014-04-14 NOTE — Progress Notes (Signed)
Subjective:    Patient ID: Chelsea Barton, female    DOB: [birth year], 53 y.o.   MRN: [id number]  HPI 53YO female presents for physical exam. Generally has been feeling well. No concerns today.  DM - One Saturday 2 months ago, had a low BG of 20. Followed by Dr. Gabriel Carina and has adjusted insulin. BG have improved, with no further low BG, but did not bring record of these today.  Trying to follow a healthier diet and exercises daily by walking. Would like to schedule mammogram. Declines referral for colonoscopy.  Review of Systems  Constitutional: Negative for fever, chills, appetite change, fatigue and unexpected weight change.  Eyes: Negative for visual disturbance.  Respiratory: Negative for shortness of breath.   Cardiovascular: Negative for chest pain and leg swelling.  Gastrointestinal: Negative for nausea, vomiting, abdominal pain, diarrhea, constipation and blood in stool.  Musculoskeletal: Negative for arthralgias and myalgias.  Skin: Negative for color change and rash.  Hematological: Negative for adenopathy. Does not bruise/bleed easily.  Psychiatric/Behavioral: Negative for sleep disturbance and dysphoric mood. The patient is not nervous/anxious.        Objective:    BP 118/70  Pulse 71  Temp(Src) 97.9 F (36.6 C) (Oral)  Ht 5' 6.25" (1.683 m)  Wt 200 lb (90.719 kg)  BMI 32.03 kg/m2  SpO2 91% Physical Exam  Constitutional: She is oriented to person, place, and time. She appears well-developed and well-nourished. No distress.  HENT:  Head: Normocephalic and atraumatic.  Right Ear: External ear normal.  Left Ear: External ear normal.  Nose: Nose normal.  Mouth/Throat: Oropharynx is clear and moist. No oropharyngeal exudate.  Eyes: Conjunctivae are normal. Pupils are equal, round, and reactive to light. Right eye exhibits no discharge. Left eye exhibits no discharge. No scleral icterus.  Neck: Normal range of motion. Neck supple. No tracheal deviation present. No  thyromegaly present.  Cardiovascular: Normal rate, regular rhythm, normal heart sounds and intact distal pulses.  Exam reveals no gallop and no friction rub.   No murmur heard. Pulmonary/Chest: Effort normal and breath sounds normal. No accessory muscle usage. Not tachypneic. No respiratory distress. She has no decreased breath sounds. She has no wheezes. She has no rales. She exhibits no tenderness. Right breast exhibits no inverted nipple, no mass, no nipple discharge, no skin change and no tenderness. Left breast exhibits no inverted nipple, no mass, no nipple discharge, no skin change and no tenderness. Breasts are symmetrical.  Abdominal: Soft. Bowel sounds are normal. She exhibits no distension and no mass. There is no tenderness. There is no rebound and no guarding.  Musculoskeletal: Normal range of motion. She exhibits no edema and no tenderness.  Lymphadenopathy:    She has no cervical adenopathy.  Neurological: She is alert and oriented to person, place, and time. No cranial nerve deficit. She exhibits normal muscle tone. Coordination normal.  Skin: Skin is warm and dry. No rash noted. She is not diaphoretic. No erythema. No pallor.  Psychiatric: She has a normal mood and affect. Her behavior is normal. Judgment and thought content normal.          Assessment & Plan:   Problem List Items Addressed This Visit     Unprioritized   Diabetes mellitus type 2, controlled     Recheck A1c with endocrinologist next month.    Obesity      Wt Readings from Last 3 Encounters:  04/14/14 200 lb (90.719 kg)  09/27/13 200 lb (90.719 kg)  07/29/13 198 lb (89.812 kg)   Body mass index is 32.03 kg/(m^2). Encouraged healthy diet and exercise with goal of weight loss.    Routine general medical examination at a health care facility - Primary     General medical exam normal today including breast exam. PAP and pelvic deferred as PAP normal, HPV neg in 2014. Plan repeat in 2017. Mammogram  ordered. Colonoscopy declined by pt. Flu vaccine declined by pt. Reviewed healthy diet and exercise with goal of weight loss. Labs through her endocrinologist next month.    Relevant Orders      MM Digital Screening       Return in about 6 months (around 10/14/2014) for Recheck of Diabetes.

## 2014-04-14 NOTE — Assessment & Plan Note (Signed)
Wt Readings from Last 3 Encounters:  04/14/14 200 lb (90.719 kg)  09/27/13 200 lb (90.719 kg)  07/29/13 198 lb (89.812 kg)   Body mass index is 32.03 kg/(m^2). Encouraged healthy diet and exercise with goal of weight loss.

## 2014-04-14 NOTE — Patient Instructions (Signed)

## 2014-04-14 NOTE — Progress Notes (Signed)
Pre visit review using our clinic review tool, if applicable. No additional management support is needed unless otherwise documented below in the visit note. 

## 2014-04-14 NOTE — Assessment & Plan Note (Signed)
Recheck A1c with endocrinologist next month.

## 2014-04-14 NOTE — Assessment & Plan Note (Signed)
General medical exam normal today including breast exam. PAP and pelvic deferred as PAP normal, HPV neg in 2014. Plan repeat in 2017. Mammogram ordered. Colonoscopy declined by pt. Flu vaccine declined by pt. Reviewed healthy diet and exercise with goal of weight loss. Labs through her endocrinologist next month.

## 2014-04-21 ENCOUNTER — Other Ambulatory Visit: Payer: Self-pay | Admitting: Internal Medicine

## 2014-05-24 ENCOUNTER — Other Ambulatory Visit: Payer: Self-pay | Admitting: Internal Medicine

## 2014-06-30 ENCOUNTER — Ambulatory Visit (INDEPENDENT_AMBULATORY_CARE_PROVIDER_SITE_OTHER): Payer: Medicaid Other | Admitting: Podiatry

## 2014-06-30 DIAGNOSIS — E119 Type 2 diabetes mellitus without complications: Secondary | ICD-10-CM

## 2014-06-30 DIAGNOSIS — B351 Tinea unguium: Secondary | ICD-10-CM

## 2014-06-30 DIAGNOSIS — M79676 Pain in unspecified toe(s): Secondary | ICD-10-CM

## 2014-06-30 NOTE — Progress Notes (Signed)
Patient ID: LUCAS AURE, female   DOB: 1961-05-03, 54 y.o.   MRN: [id number]  Subjective: Ms. Quilty returns the office they for diabetic risk assessment and painful elongated nails. She states that she is diabetic and her last blood sugar was "good" but did not recall the number. She states her nails rub in the shoes and are painful. Denies any recent redness or drainage from around the nail sites. Denies any recent ulceration. She denies any tingling or numbness or any claudication symptoms. No other complaints at this time.  Objective: AAO x3, NAD DP/PT pulses palpable bilaterally, CRT less than 3 seconds Protective sensation decreased with Simms Weinstein monofilament, Achilles tendon reflex intact. Nails hypertrophic, dystrophic, elongated, brittle, yellow discoloration bilaterally. No surrounding erythema or drainage or other clinical signs of infection.  No open lesions or pre-ulcerative lesions. No overlying edema, erythema, increase in warmth to bilateral lower Kenny's. No areas of pinpoint bony tenderness. MMT 5/5, ROM WNL No calf pain, swelling, warmth, erythema.   Assessment: 54 year old female with symptomatic onychomycosis  Plan: -Treatment options discussed including alternatives, risks, complications. -Nail sharply debrided without complications/bleeding Q000111Q as all the nails were subjectively painful.  -Discussed the importance of daily foot inspection. -Followup in 3 months or sooner if any problems are to arise or any changes symptoms. In the meantime call the office with any questions, concerns

## 2014-06-30 NOTE — Patient Instructions (Signed)
Diabetes and Foot Care Diabetes may cause you to have problems because of poor blood supply (circulation) to your feet and legs. This may cause the skin on your feet to become thinner, break easier, and heal more slowly. Your skin may become dry, and the skin may peel and crack. You may also have nerve damage in your legs and feet causing decreased feeling in them. You may not notice minor injuries to your feet that could lead to infections or more serious problems. Taking care of your feet is one of the most important things you can do for yourself.  HOME CARE INSTRUCTIONS  Wear shoes at all times, even in the house. Do not go barefoot. Bare feet are easily injured.  Check your feet daily for blisters, cuts, and redness. If you cannot see the bottom of your feet, use a mirror or ask someone for help.  Wash your feet with warm water (do not use hot water) and mild soap. Then pat your feet and the areas between your toes until they are completely dry. Do not soak your feet as this can dry your skin.  Apply a moisturizing lotion or petroleum jelly (that does not contain alcohol and is unscented) to the skin on your feet and to dry, brittle toenails. Do not apply lotion between your toes.  Trim your toenails straight across. Do not dig under them or around the cuticle. File the edges of your nails with an emery board or nail file.  Do not cut corns or calluses or try to remove them with medicine.  Wear clean socks or stockings every day. Make sure they are not too tight. Do not wear knee-high stockings since they may decrease blood flow to your legs.  Wear shoes that fit properly and have enough cushioning. To break in new shoes, wear them for just a few hours a day. This prevents you from injuring your feet. Always look in your shoes before you put them on to be sure there are no objects inside.  Do not cross your legs. This may decrease the blood flow to your feet.  If you find a minor scrape,  cut, or break in the skin on your feet, keep it and the skin around it clean and dry. These areas may be cleansed with mild soap and water. Do not cleanse the area with peroxide, alcohol, or iodine.  When you remove an adhesive bandage, be sure not to damage the skin around it.  If you have a wound, look at it several times a day to make sure it is healing.  Do not use heating pads or hot water bottles. They may burn your skin. If you have lost feeling in your feet or legs, you may not know it is happening until it is too late.  Make sure your health care provider performs a complete foot exam at least annually or more often if you have foot problems. Report any cuts, sores, or bruises to your health care provider immediately. SEEK MEDICAL CARE IF:   You have an injury that is not healing.  You have cuts or breaks in the skin.  You have an ingrown nail.  You notice redness on your legs or feet.  You feel burning or tingling in your legs or feet.  You have pain or cramps in your legs and feet.  Your legs or feet are numb.  Your feet always feel cold. SEEK IMMEDIATE MEDICAL CARE IF:   There is increasing redness,   swelling, or pain in or around a wound.  There is a red line that goes up your leg.  Pus is coming from a wound.  You develop a fever or as directed by your health care provider.  You notice a bad smell coming from an ulcer or wound. Document Released: 05/31/2000 Document Revised: 02/03/2013 Document Reviewed: 11/10/2012 Exit[employer] Patient Information 2015 [employer]. This information is not intended to replace advice given to you by your health care provider. Make sure you discuss any questions you have with your health care provider.  

## 2014-09-08 ENCOUNTER — Other Ambulatory Visit: Payer: Self-pay | Admitting: Internal Medicine

## 2014-09-26 ENCOUNTER — Other Ambulatory Visit: Payer: Self-pay | Admitting: Internal Medicine

## 2014-10-03 DIAGNOSIS — I10 Essential (primary) hypertension: Secondary | ICD-10-CM | POA: Insufficient documentation

## 2014-10-06 LAB — HM DIABETES EYE EXAM

## 2014-10-12 ENCOUNTER — Ambulatory Visit (INDEPENDENT_AMBULATORY_CARE_PROVIDER_SITE_OTHER): Payer: Medicaid Other | Admitting: Nurse Practitioner

## 2014-10-12 ENCOUNTER — Encounter: Payer: Self-pay | Admitting: Nurse Practitioner

## 2014-10-12 VITALS — BP 146/76 | HR 82 | Temp 98.5°F | Resp 16 | Ht 66.26 in | Wt 200.0 lb

## 2014-10-12 DIAGNOSIS — B9789 Other viral agents as the cause of diseases classified elsewhere: Principal | ICD-10-CM

## 2014-10-12 DIAGNOSIS — J069 Acute upper respiratory infection, unspecified: Secondary | ICD-10-CM

## 2014-10-12 NOTE — Assessment & Plan Note (Signed)
Discussed with pt why abx are not indicated at this time. No findings on exam support need for abx. Will try OTC measures such as saline nasal spray, nasonex, Benadryl at night, Claritin D during the day. Okay to continue with Robitussin. FU prn worsening/failure to improve.

## 2014-10-12 NOTE — Addendum Note (Signed)
Addended by: Rubbie Battiest on: 10/12/2014 03:08 PM   Modules accepted: Miquel Dunn

## 2014-10-12 NOTE — Patient Instructions (Addendum)
Your cough may be coming from reflux, allergies, or post nasal drip (PND).  PND and allergies can be treated with benadryl Claritin-D. Benadryl is the most effective for drying you up but it is also the most sedating,  So try taking it at night and use one of the others in the daytime  Nasonex nasal spray.

## 2014-10-12 NOTE — Progress Notes (Addendum)
   Subjective:    Patient ID: Chelsea Barton, female    DOB: November 26, 1960, 54 y.o.   MRN: [id number]  HPI  Chelsea Barton is a 54 yo female with a CC of sore throat and cough.   1) Started last Thursday, sneezing, sore throat, clear rhinorrhea Gargling- Helpful  Robitussin- helpful  Mucinex- not helpful  Nasonex- Helpful   Review of Systems  Constitutional: Positive for fatigue. Negative for fever, chills and diaphoresis.  HENT: Positive for congestion, postnasal drip, rhinorrhea, sneezing and sore throat. Negative for sinus pressure and trouble swallowing.   Eyes: Positive for redness. Negative for visual disturbance.  Respiratory: Positive for cough. Negative for chest tightness, shortness of breath and wheezing.   Cardiovascular: Negative for chest pain, palpitations and leg swelling.  Gastrointestinal: Negative for nausea, vomiting and diarrhea.  Skin: Negative for rash.  Neurological: Negative for dizziness, weakness, numbness and headaches.      Objective:   Physical Exam  Constitutional: She is oriented to person, place, and time. She appears well-developed and well-nourished. No distress.  BP 146/76 mmHg  Pulse 82  Temp(Src) 98.5 F (36.9 C) (Oral)  Resp 16  Ht 5' 6.26" (1.683 m)  Wt 200 lb (90.719 kg)  BMI 32.03 kg/m2  SpO2 95%   HENT:  Head: Normocephalic and atraumatic.  Right Ear: External ear normal.  Left Ear: External ear normal.  Mouth/Throat: No oropharyngeal exudate.  TM's clear bilaterally  Some wax in the canal   Cardiovascular: Normal rate, regular rhythm, normal heart sounds and intact distal pulses.  Exam reveals no gallop and no friction rub.   No murmur heard. Pulmonary/Chest: Effort normal and breath sounds normal. No respiratory distress. She has no wheezes. She has no rales. She exhibits no tenderness.  Neurological: She is alert and oriented to person, place, and time. No cranial nerve deficit. She exhibits normal muscle tone. Coordination normal.   Skin: Skin is warm and dry. No rash noted. She is not diaphoretic.  Psychiatric: She has a normal mood and affect. Her behavior is normal. Judgment and thought content normal.      Assessment & Plan:

## 2014-10-12 NOTE — Progress Notes (Signed)
Pre visit review using our clinic review tool, if applicable. No additional management support is needed unless otherwise documented below in the visit note. 

## 2014-10-14 ENCOUNTER — Encounter: Payer: Self-pay | Admitting: Internal Medicine

## 2014-10-14 ENCOUNTER — Ambulatory Visit (INDEPENDENT_AMBULATORY_CARE_PROVIDER_SITE_OTHER): Payer: Medicaid Other | Admitting: Internal Medicine

## 2014-10-14 VITALS — BP 145/75 | HR 74 | Temp 98.0°F | Ht 66.25 in | Wt 199.4 lb

## 2014-10-14 DIAGNOSIS — E669 Obesity, unspecified: Secondary | ICD-10-CM

## 2014-10-14 DIAGNOSIS — E785 Hyperlipidemia, unspecified: Secondary | ICD-10-CM

## 2014-10-14 DIAGNOSIS — E119 Type 2 diabetes mellitus without complications: Secondary | ICD-10-CM

## 2014-10-14 DIAGNOSIS — I10 Essential (primary) hypertension: Secondary | ICD-10-CM | POA: Diagnosis not present

## 2014-10-14 LAB — COMPREHENSIVE METABOLIC PANEL
ALK PHOS: 63 U/L (ref 39–117)
ALT: 13 U/L (ref 0–35)
AST: 14 U/L (ref 0–37)
Albumin: 3.2 g/dL — ABNORMAL LOW (ref 3.5–5.2)
BUN: 19 mg/dL (ref 6–23)
CO2: 32 mEq/L (ref 19–32)
Calcium: 9.6 mg/dL (ref 8.4–10.5)
Chloride: 103 mEq/L (ref 96–112)
Creatinine, Ser: 1.19 mg/dL (ref 0.40–1.20)
GFR: 60.53 mL/min (ref >60.00)
GLUCOSE: 120 mg/dL — AB (ref 70–99)
Potassium: 4.6 mEq/L (ref 3.5–5.1)
Sodium: 139 mEq/L (ref 135–145)
Total Bilirubin: 0.3 mg/dL (ref 0.2–1.2)
Total Protein: 6.7 g/dL (ref 6.0–8.3)

## 2014-10-14 LAB — LIPID PANEL
Cholesterol: 115 mg/dL (ref 0–200)
HDL: 36.9 mg/dL — ABNORMAL LOW (ref >39.00)
LDL CALC: 57 mg/dL (ref 0–99)
NonHDL: 78.1
Total CHOL/HDL Ratio: 3
Triglycerides: 104 mg/dL (ref 0.0–149.0)
VLDL: 20.8 mg/dL (ref 0.0–40.0)

## 2014-10-14 LAB — MICROALBUMIN / CREATININE URINE RATIO
Creatinine,U: 358.1 mg/dL
Microalb Creat Ratio: 30.4 mg/g — ABNORMAL HIGH (ref 0.0–30.0)
Microalb, Ur: 109 mg/dL — ABNORMAL HIGH (ref 0.0–1.9)

## 2014-10-14 LAB — HEMOGLOBIN A1C: Hgb A1c MFr Bld: 8.2 % — ABNORMAL HIGH (ref 4.6–6.5)

## 2014-10-14 NOTE — Assessment & Plan Note (Signed)
Will check lipids and LFTs with labs. Continue Simvastatin. 

## 2014-10-14 NOTE — Patient Instructions (Signed)

## 2014-10-14 NOTE — Progress Notes (Signed)
Pre visit review using our clinic review tool, if applicable. No additional management support is needed unless otherwise documented below in the visit note. 

## 2014-10-14 NOTE — Progress Notes (Signed)
Subjective:    Patient ID: Chelsea Barton, female    DOB: 01-Jun-1961, 54 y.o.   MRN: [id number]  HPI  54YO female presents for follow up.  DM - Blood sugars near 100 mostly. Compliant with insulin daily. Occasional low BG <70, probably 4-5 times in 3 months. Feels sweaty and nauseous with low BG. Improved with orange juice.  Trying to follow a healthy diet.  Exercising by walking during week.  Wt Readings from Last 3 Encounters:  10/14/14 199 lb 6 oz (90.436 kg)  10/12/14 200 lb (90.719 kg)  04/14/14 200 lb (90.719 kg)     Past medical, surgical, family and social history per today's encounter.  Review of Systems  Constitutional: Negative for fever, chills, appetite change, fatigue and unexpected weight change.  Eyes: Negative for visual disturbance.  Respiratory: Negative for shortness of breath.   Cardiovascular: Negative for chest pain and leg swelling.  Gastrointestinal: Negative for nausea, vomiting, abdominal pain, diarrhea and constipation.  Skin: Negative for color change and rash.  Hematological: Negative for adenopathy. Does not bruise/bleed easily.  Psychiatric/Behavioral: Negative for dysphoric mood. The patient is not nervous/anxious.        Objective:    BP 145/75 mmHg  Pulse 74  Temp(Src) 98 F (36.7 C) (Oral)  Ht 5' 6.25" (1.683 m)  Wt 199 lb 6 oz (90.436 kg)  BMI 31.93 kg/m2  SpO2 98% Physical Exam  Constitutional: She is oriented to person, place, and time. She appears well-developed and well-nourished. No distress.  HENT:  Head: Normocephalic and atraumatic.  Right Ear: External ear normal.  Left Ear: External ear normal.  Nose: Nose normal.  Mouth/Throat: Oropharynx is clear and moist. No oropharyngeal exudate.  Eyes: Conjunctivae are normal. Pupils are equal, round, and reactive to light. Right eye exhibits no discharge. Left eye exhibits no discharge. No scleral icterus.  Neck: Normal range of motion. Neck supple. No tracheal deviation  present. No thyromegaly present.  Cardiovascular: Normal rate, regular rhythm, normal heart sounds and intact distal pulses.  Exam reveals no gallop and no friction rub.   No murmur heard. Pulmonary/Chest: Effort normal and breath sounds normal. No respiratory distress. She has no wheezes. She has no rales. She exhibits no tenderness.  Musculoskeletal: Normal range of motion. She exhibits no edema or tenderness.  Lymphadenopathy:    She has no cervical adenopathy.  Neurological: She is alert and oriented to person, place, and time. No cranial nerve deficit. She exhibits normal muscle tone. Coordination normal.  Skin: Skin is warm and dry. No rash noted. She is not diaphoretic. No erythema. No pallor.  Psychiatric: She has a normal mood and affect. Her behavior is normal. Judgment and thought content normal.          Assessment & Plan:   Problem List Items Addressed This Visit      Unprioritized   Diabetes mellitus type 2, controlled - Primary    Will check A1c with labs. Continue Lantus and Novolog. Discussed keeping a non-perishable item such as cake icing in her home for low blood sugar.      Relevant Orders   Comprehensive metabolic panel   Hemoglobin A1c   Lipid panel   Microalbumin / creatinine urine ratio   Hyperlipidemia    Will check lipids and LFTs with labs. Continue Simvastatin.      Hypertension    BP Readings from Last 3 Encounters:  10/14/14 145/75  10/12/14 146/76  04/14/14 118/70   BP slightly elevated.  Will check renal function with labs. Continue Carvedilol and Enalapril.      Obesity    Wt Readings from Last 3 Encounters:  10/14/14 199 lb 6 oz (90.436 kg)  10/12/14 200 lb (90.719 kg)  04/14/14 200 lb (90.719 kg)   Encouraged healthy, Mediterranean style diet.          Return in about 3 months (around 01/13/2015) for Recheck of Diabetes.

## 2014-10-14 NOTE — Assessment & Plan Note (Signed)
Will check A1c with labs. Continue Lantus and Novolog. Discussed keeping a non-perishable item such as cake icing in her home for low blood sugar.

## 2014-10-14 NOTE — Assessment & Plan Note (Signed)
Wt Readings from Last 3 Encounters:  10/14/14 199 lb 6 oz (90.436 kg)  10/12/14 200 lb (90.719 kg)  04/14/14 200 lb (90.719 kg)   Encouraged healthy, Mediterranean style diet.

## 2014-10-14 NOTE — Assessment & Plan Note (Signed)
BP Readings from Last 3 Encounters:  10/14/14 145/75  10/12/14 146/76  04/14/14 118/70   BP slightly elevated. Will check renal function with labs. Continue Carvedilol and Enalapril.

## 2014-10-17 ENCOUNTER — Encounter: Payer: Self-pay | Admitting: Internal Medicine

## 2014-10-25 ENCOUNTER — Ambulatory Visit (INDEPENDENT_AMBULATORY_CARE_PROVIDER_SITE_OTHER): Payer: Medicaid Other | Admitting: Podiatry

## 2014-10-25 DIAGNOSIS — E119 Type 2 diabetes mellitus without complications: Secondary | ICD-10-CM

## 2014-10-25 DIAGNOSIS — B351 Tinea unguium: Secondary | ICD-10-CM | POA: Diagnosis not present

## 2014-10-25 DIAGNOSIS — M79676 Pain in unspecified toe(s): Secondary | ICD-10-CM | POA: Diagnosis not present

## 2014-10-25 NOTE — Progress Notes (Signed)
Patient ID: Chelsea Barton, female   DOB: [birth year], 54 y.o.   MRN: [id number]  Subjective: 54 y.o.-year-old female returns the office today for painful, elongated, thickened toenails which she is unable to trim herself. Denies any redness or drainage around the nails. Denies any acute changes since last appointment and no new complaints today. Denies any systemic complaints such as fevers, chills, nausea, vomiting.   Objective: AAO 3, NAD DP/PT pulses palpable, CRT less than 3 seconds Protective sensation decreased with Simms Weinstein monofilament, Achilles tendon reflex intact.  Nails hypertrophic, dystrophic, elongated, brittle, discolored 10. There is tenderness overlying these nails. There is no surrounding erythema or drainage along the nail sites. No open lesions or pre-ulcerative lesions are identified. No other areas of tenderness bilateral lower extremities. No overlying edema, erythema, increased warmth. No pain with calf compression, swelling, warmth, erythema.  Assessment: Patient presents with symptomatic onychomycosis  Plan: -Treatment options including alternatives, risks, complications were discussed -Nails sharply debrided 10 without complication/bleeding. -Discussed daily foot inspection. If there are any changes, to call the office immediately.  -Follow-up in 3 months or sooner if any problems are to arise. In the meantime, encouraged to call the office with any questions, concerns, changes symptoms.

## 2014-11-02 ENCOUNTER — Ambulatory Visit: Payer: Medicaid Other | Attending: Internal Medicine

## 2014-11-13 LAB — HM DIABETES FOOT EXAM

## 2014-11-22 ENCOUNTER — Encounter: Payer: Self-pay | Admitting: *Deleted

## 2014-11-22 ENCOUNTER — Ambulatory Visit
Admission: RE | Admit: 2014-11-22 | Discharge: 2014-11-22 | Disposition: A | Discharge status: Home / Self Care | Payer: Medicaid Other | Source: Ambulatory Visit | Attending: Internal Medicine | Admitting: Internal Medicine

## 2014-11-22 DIAGNOSIS — Z1231 Encounter for screening mammogram for malignant neoplasm of breast: Secondary | ICD-10-CM | POA: Diagnosis present

## 2014-11-22 DIAGNOSIS — Z0000 Encounter for general adult medical examination without abnormal findings: Secondary | ICD-10-CM

## 2015-01-06 ENCOUNTER — Other Ambulatory Visit: Payer: Self-pay | Admitting: Internal Medicine

## 2015-01-13 ENCOUNTER — Ambulatory Visit (INDEPENDENT_AMBULATORY_CARE_PROVIDER_SITE_OTHER): Payer: Medicaid Other | Admitting: Internal Medicine

## 2015-01-13 ENCOUNTER — Encounter: Payer: Self-pay | Admitting: Internal Medicine

## 2015-01-13 VITALS — BP 84/51 | HR 67 | Temp 97.6°F | Ht 66.25 in | Wt 200.2 lb

## 2015-01-13 DIAGNOSIS — I10 Essential (primary) hypertension: Secondary | ICD-10-CM | POA: Diagnosis not present

## 2015-01-13 DIAGNOSIS — E785 Hyperlipidemia, unspecified: Secondary | ICD-10-CM

## 2015-01-13 DIAGNOSIS — T22019A Burn of unspecified degree of unspecified forearm, initial encounter: Secondary | ICD-10-CM | POA: Insufficient documentation

## 2015-01-13 DIAGNOSIS — E119 Type 2 diabetes mellitus without complications: Secondary | ICD-10-CM | POA: Diagnosis not present

## 2015-01-13 DIAGNOSIS — T22212A Burn of second degree of left forearm, initial encounter: Secondary | ICD-10-CM

## 2015-01-13 LAB — COMPREHENSIVE METABOLIC PANEL
ALT: 13 U/L (ref 0–35)
AST: 16 U/L (ref 0–37)
Albumin: 3.4 g/dL — ABNORMAL LOW (ref 3.5–5.2)
Alkaline Phosphatase: 70 U/L (ref 39–117)
BILIRUBIN TOTAL: 0.3 mg/dL (ref 0.2–1.2)
BUN: 18 mg/dL (ref 6–23)
CALCIUM: 9.2 mg/dL (ref 8.4–10.5)
CO2: 29 mEq/L (ref 19–32)
CREATININE: 1.11 mg/dL (ref 0.40–1.20)
Chloride: 108 mEq/L (ref 96–112)
GFR: 65.54 mL/min (ref >60.00)
GLUCOSE: 95 mg/dL (ref 70–99)
POTASSIUM: 4.1 meq/L (ref 3.5–5.1)
Sodium: 143 mEq/L (ref 135–145)
Total Protein: 6.6 g/dL (ref 6.0–8.3)

## 2015-01-13 LAB — MICROALBUMIN / CREATININE URINE RATIO
Creatinine,U: 189.2 mg/dL
Microalb Creat Ratio: 39.8 mg/g — ABNORMAL HIGH (ref 0.0–30.0)
Microalb, Ur: 75.3 mg/dL — ABNORMAL HIGH (ref 0.0–1.9)

## 2015-01-13 LAB — LIPID PANEL
CHOL/HDL RATIO: 3
CHOLESTEROL: 152 mg/dL (ref 0–200)
HDL: 47.2 mg/dL (ref >39.00)
LDL CALC: 83 mg/dL (ref 0–99)
NonHDL: 104.3
TRIGLYCERIDES: 109 mg/dL (ref 0.0–149.0)
VLDL: 21.8 mg/dL (ref 0.0–40.0)

## 2015-01-13 LAB — HEMOGLOBIN A1C: Hgb A1c MFr Bld: 8.6 % — ABNORMAL HIGH (ref 4.6–6.5)

## 2015-01-13 MED ORDER — SILVER SULFADIAZINE 1 % EX CREA: 1.0000 "application " | TOPICAL_CREAM | Freq: Every day | CUTANEOUS | Status: DC

## 2015-01-13 NOTE — Patient Instructions (Addendum)
Labs today.  Apply Silvadene cream or Neosporin to wound twice daily. Call if wound is not improving.  Follow up in 3 months or sooner as needed.

## 2015-01-13 NOTE — Assessment & Plan Note (Signed)
Will check lipids and LFTs with labs. Continue Simvastatin. 

## 2015-01-13 NOTE — Progress Notes (Signed)
Pre visit review using our clinic review tool, if applicable. No additional management support is needed unless otherwise documented below in the visit note. 
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
## 2015-01-13 NOTE — Assessment & Plan Note (Signed)
Will check A1c with labs. Discussed close monitoring for low BG and treatment. Follow up in 3 months.
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
## 2015-01-13 NOTE — Assessment & Plan Note (Signed)
BP Readings from Last 3 Encounters:  01/13/15 84/51  10/14/14 145/75  10/12/14 146/76   BP lower today, but generally has been well controlled. Renal function with labs. Continue current meds.

## 2015-02-02 ENCOUNTER — Ambulatory Visit (INDEPENDENT_AMBULATORY_CARE_PROVIDER_SITE_OTHER): Payer: Medicaid Other | Admitting: Podiatry

## 2015-02-02 DIAGNOSIS — E119 Type 2 diabetes mellitus without complications: Secondary | ICD-10-CM

## 2015-02-02 DIAGNOSIS — M79676 Pain in unspecified toe(s): Secondary | ICD-10-CM

## 2015-02-02 DIAGNOSIS — B351 Tinea unguium: Secondary | ICD-10-CM | POA: Diagnosis not present

## 2015-02-02 NOTE — Progress Notes (Signed)
Patient ID: Chelsea Barton, female   DOB: 05-02-1961, 54 y.o.   MRN: [id number]  Subjective: 54 y.o.-year-old female returns the office today for painful, elongated, thickened toenails which she is unable to trim herself. Denies any redness or drainage around the nails. Denies any acute changes since last appointment and no new complaints today. Denies any systemic complaints such as fevers, chills, nausea, vomiting.   Objective: AAO 3, NAD DP/PT pulses palpable, CRT less than 3 seconds Protective sensation decreased with Simms Weinstein monofilament, Achilles tendon reflex intact.  Nails hypertrophic, dystrophic, elongated, brittle, discolored 10. There is tenderness overlying these nails. There is no surrounding erythema or drainage along the nail sites. No open lesions or pre-ulcerative lesions are identified. No other areas of tenderness bilateral lower extremities. No overlying edema, erythema, increased warmth. No pain with calf compression, swelling, warmth, erythema.  Assessment: Patient presents with symptomatic onychomycosis  Plan: -Treatment options including alternatives, risks, complications were discussed -Nails sharply debrided 10 without complication/bleeding. -Discussed daily foot inspection. If there are any changes, to call the office immediately.  -Follow-up in 3 months or sooner if any problems are to arise. In the meantime, encouraged to call the office with any questions, concerns, changes symptoms. 3 mo.  Celesta Gentile, DPM

## 2015-02-28 ENCOUNTER — Telehealth: Payer: Self-pay | Admitting: Internal Medicine

## 2015-02-28 NOTE — Telephone Encounter (Signed)
Debbie called from [facility] [phone]  to get a verbal order to recert pt for another 9 weeks. Thank You!

## 2015-03-01 DIAGNOSIS — E1042 Type 1 diabetes mellitus with diabetic polyneuropathy: Secondary | ICD-10-CM | POA: Insufficient documentation

## 2015-03-01 DIAGNOSIS — E1065 Type 1 diabetes mellitus with hyperglycemia: Secondary | ICD-10-CM | POA: Insufficient documentation

## 2015-03-01 NOTE — Telephone Encounter (Signed)
Left detailed message on [facility] VM

## 2015-03-06 DIAGNOSIS — I340 Nonrheumatic mitral (valve) insufficiency: Secondary | ICD-10-CM | POA: Insufficient documentation

## 2015-03-13 ENCOUNTER — Other Ambulatory Visit: Payer: Self-pay | Admitting: Internal Medicine

## 2015-03-27 ENCOUNTER — Other Ambulatory Visit: Payer: Self-pay | Admitting: Internal Medicine

## 2015-05-01 ENCOUNTER — Ambulatory Visit (INDEPENDENT_AMBULATORY_CARE_PROVIDER_SITE_OTHER): Payer: Medicaid Other | Admitting: Family Medicine

## 2015-05-01 ENCOUNTER — Encounter: Payer: Self-pay | Admitting: Family Medicine

## 2015-05-01 VITALS — BP 136/78 | HR 69 | Temp 98.2°F | Ht 66.25 in | Wt 204.8 lb

## 2015-05-01 DIAGNOSIS — S91301A Unspecified open wound, right foot, initial encounter: Secondary | ICD-10-CM | POA: Diagnosis not present

## 2015-05-01 DIAGNOSIS — S91309A Unspecified open wound, unspecified foot, initial encounter: Secondary | ICD-10-CM | POA: Insufficient documentation

## 2015-05-01 NOTE — Progress Notes (Signed)
Pre visit review using our clinic review tool, if applicable. No additional management support is needed unless otherwise documented below in the visit note. 

## 2015-05-01 NOTE — Progress Notes (Signed)
   Subjective:  Patient ID: Chelsea Barton, female    DOB: April 09, 1961  Age: 54 y.o. MRN: [id number]  CC: Foot wound  HPI:  54 year old female with uncontrolled DM-2 presents to clinic today with complaints of a foot wound.  Patient reports that approximately 1-2 weeks ago she stepped on a hot oven tray (she and someone else were cleaning).  As a result she developed a wound. She states that it is been slowly healing but given the fact she is diabetic she decided to come in for evaluation. No associated fevers or chills. No drainage from the wound. No exacerbating or relieving factors.  Social Hx   Social History   Social History  . Marital Status: Single    Spouse Name: N/A  . Number of Children: N/A  . Years of Education: N/A   Social History Main Topics  . Smoking status: Former Smoker    Quit date: 06/18/2005  . Smokeless tobacco: Never Used  . Alcohol Use: No  . Drug Use: No  . Sexual Activity: Not Asked   Other Topics Concern  . None   Social History Narrative   Review of Systems  Constitutional: Negative for fever and chills.  Skin: Positive for wound.   Objective:  BP 136/78 mmHg  Pulse 69  Temp(Src) 98.2 F (36.8 C) (Oral)  Ht 5' 6.25" (1.683 m)  Wt 204 lb 12 oz (92.874 kg)  BMI 32.79 kg/m2  SpO2 97%  BP/Weight 05/01/2015 01/13/2015 [id number]  Systolic BP [id number] 84 Q000111Q  Diastolic BP 78 51 75  Wt. (Lbs) 204.75 200.25 199.38  BMI 32.79 32.07 31.93   Physical Exam  Constitutional: She appears well-developed. No distress.  Pulmonary/Chest: Effort normal.  Neurological: She is alert.  Skin:  Right foot - linear open wound noted level of the MTP joint. Maceration also noted between the toes. Callus also noted. No fluctuance or purulence noted.  Psychiatric: She has a normal mood and affect.  Vitals reviewed.  Lab Results  Component Value Date   WBC 11.2* 12/14/2012   HGB 12.0 12/14/2012   HCT 37.7 12/14/2012   PLT 224.0 12/14/2012   GLUCOSE 95  01/13/2015   CHOL 152 01/13/2015   TRIG 109.0 01/13/2015   HDL 47.20 01/13/2015   LDLDIRECT 84.9 09/10/2012   LDLCALC 83 01/13/2015   ALT 13 01/13/2015   AST 16 01/13/2015   NA 143 01/13/2015   K 4.1 01/13/2015   CL 108 01/13/2015   CREATININE 1.11 01/13/2015   BUN 18 01/13/2015   CO2 29 01/13/2015   HGBA1C 8.6* 01/13/2015   MICROALBUR 75.3* 01/13/2015    Assessment & Plan:   Problem List Items Addressed This Visit    Wound, open, foot - Primary    Placing referral to wound care as she needs to be followed closely and is at high risk for poor wound healing.      Relevant Orders   AMB referral to wound care center     Follow-up: 2 weeks.  Thersa Salt, DO

## 2015-05-01 NOTE — Assessment & Plan Note (Signed)
Placing referral to wound care as she needs to be followed closely and is at high risk for poor wound healing.

## 2015-05-01 NOTE — Patient Instructions (Signed)
We will call with your referral to wound.  Keep the area clean.  Be sure to keep a close eye on it.  Follow up in 2 weeks.   Take care  Dr. Lacinda Axon

## 2015-05-04 ENCOUNTER — Encounter: Payer: Medicaid Other | Attending: Surgery | Admitting: Surgery

## 2015-05-04 DIAGNOSIS — L84 Corns and callosities: Secondary | ICD-10-CM | POA: Diagnosis not present

## 2015-05-04 DIAGNOSIS — E785 Hyperlipidemia, unspecified: Secondary | ICD-10-CM | POA: Diagnosis not present

## 2015-05-04 DIAGNOSIS — I10 Essential (primary) hypertension: Secondary | ICD-10-CM | POA: Insufficient documentation

## 2015-05-04 DIAGNOSIS — E669 Obesity, unspecified: Secondary | ICD-10-CM | POA: Diagnosis not present

## 2015-05-04 DIAGNOSIS — X58XXXA Exposure to other specified factors, initial encounter: Secondary | ICD-10-CM | POA: Insufficient documentation

## 2015-05-04 DIAGNOSIS — L97511 Non-pressure chronic ulcer of other part of right foot limited to breakdown of skin: Secondary | ICD-10-CM | POA: Diagnosis not present

## 2015-05-04 DIAGNOSIS — E11621 Type 2 diabetes mellitus with foot ulcer: Secondary | ICD-10-CM | POA: Diagnosis present

## 2015-05-04 DIAGNOSIS — Z87891 Personal history of nicotine dependence: Secondary | ICD-10-CM | POA: Diagnosis not present

## 2015-05-04 DIAGNOSIS — T25221A Burn of second degree of right foot, initial encounter: Secondary | ICD-10-CM | POA: Diagnosis not present
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
## 2015-05-06 NOTE — Progress Notes (Signed)
Chelsea Barton, Chelsea Barton ([id number]) Visit Report for 05/04/2015 Abuse/Suicide Risk Screen Details Patient Name: Chelsea Barton, Chelsea Barton. Date of Service: 05/04/2015 9:45 AM Medical Record Number: [id number] Patient Account Number: [id number] Date of Birth/Sex: 23-Apr-1961 (54 y.o. Female) Treating RN: Cornell Barman Primary Care Physician: Ronette Deter Other Clinician: Referring Physician: Thersa Salt Treating Physician/Extender: Frann Rider in Treatment: 0 Abuse/Suicide Risk Screen Items Answer ABUSE/SUICIDE RISK SCREEN: Has anyone close to you tried to hurt or harm you recentlyo No Do you feel uncomfortable with anyone in your familyo No Has anyone forced you do things that you didnot want to doo No Do you have any thoughts of harming yourselfo No Patient displays signs or symptoms of abuse and/or neglect. No Electronic Signature(s) Signed: 05/04/2015 5:49:44 PM By: Gretta Cool, RN, BSN, Kim RN, BSN Entered By: Gretta Cool, RN, BSN, Kim on 05/04/2015 10:24:56 [place], Chelsea Barton ([id number]) -------------------------------------------------------------------------------- Activities of Daily Living Details Patient Name: Chelsea, [name] Date of Service: 05/04/2015 9:45 AM Medical Record Number: [id number] Patient Account Number: [id number] Date of Birth/Sex: Oct 17, 1960 (54 y.o. Female) Treating RN: Cornell Barman Primary Care Physician: Ronette Deter Other Clinician: Referring Physician: Thersa Salt Treating Physician/Extender: Frann Rider in Treatment: 0 Activities of Daily Living Items Answer Activities of Daily Living (Please select one for each item) Drive Automobile Not Able Take Medications Completely Able Use Telephone Completely Able Care for Appearance Completely Able Use Toilet Completely Able Bath / Shower Completely Able Dress Self Completely Able Feed Self Completely Able Walk Completely Able Get In / Out Bed Completely Able Housework Completely Able Prepare Meals  Completely Able Handle Money Completely Able Shop for Self Completely Able Electronic Signature(s) Signed: 05/04/2015 5:49:44 PM By: Gretta Cool, RN, BSN, Kim RN, BSN Entered By: Gretta Cool, RN, BSN, Kim on 05/04/2015 10:25:16 [place], Chelsea Barton ([id number]) -------------------------------------------------------------------------------- Education Assessment Details Patient Name: Chelsea Barton D. Date of Service: 05/04/2015 9:45 AM Medical Record Number: [id number] Patient Account Number: [id number] Date of Birth/Sex: Sep 18, 1960 (54 y.o. Female) Treating RN: Cornell Barman Primary Care Physician: Ronette Deter Other Clinician: Referring Physician: Thersa Salt Treating Physician/Extender: Frann Rider in Treatment: 0 Primary Learner Assessed: Patient Learning Preferences/Education Level/Primary Language Learning Preference: Explanation Highest Education Level: High School Preferred Language: English Cognitive Barrier Assessment/Beliefs Language Barrier: No Translator Needed: No Memory Deficit: No Emotional Barrier: No Cultural/Religious Beliefs Affecting Medical No Care: Physical Barrier Assessment Impaired Vision: Yes Impaired Hearing: No Decreased Hand dexterity: No Knowledge/Comprehension Assessment Knowledge Level: High Comprehension Level: High Ability to understand written High instructions: Ability to understand verbal High instructions: Motivation Assessment Anxiety Level: Calm Cooperation: Cooperative Education Importance: Acknowledges Need Interest in Health Problems: Asks Questions Perception: Coherent Willingness to Engage in Self- High Management Activities: Readiness to Engage in Self- High Management Activities: Electronic Signature(s) Chelsea Barton, Chelsea Barton ([id number]) Signed: 05/04/2015 5:49:44 PM By: Gretta Cool, RN, BSN, Kim RN, BSN Entered By: Gretta Cool, RN, BSN, Kim on 05/04/2015 10:25:49 Chelsea Barton  ([id number]) -------------------------------------------------------------------------------- Fall Risk Assessment Details Patient Name: Chelsea Barton D. Date of Service: 05/04/2015 9:45 AM Medical Record Number: [id number] Patient Account Number: [id number] Date of Birth/Sex: Jan 26, 1961 (54 y.o. Female) Treating RN: Cornell Barman Primary Care Physician: Ronette Deter Other Clinician: Referring Physician: Thersa Salt Treating Physician/Extender: Frann Rider in Treatment: 0 Fall Risk Assessment Items FALL RISK ASSESSMENT: History of falling - immediate or within 3 months 25 Yes Secondary diagnosis 0 No Ambulatory aid None/bed rest/wheelchair/nurse 0 Yes Crutches/cane/walker 0 No Furniture 0 No IV Access/Saline Lock 0 No Gait/Training Normal/bed rest/immobile 0 Yes Weak 0  No Impaired 0 No Mental Status Oriented to own ability 0 Yes Electronic Signature(s) Signed: 05/04/2015 5:49:44 PM By: Gretta Cool, RN, BSN, Kim RN, BSN Entered By: Gretta Cool, RN, BSN, Kim on 05/04/2015 10:25:55 Chelsea Barton, Chelsea Barton ([id number]) -------------------------------------------------------------------------------- Foot Assessment Details Patient Name: Chelsea Barton D. Date of Service: 05/04/2015 9:45 AM Medical Record Number: [id number] Patient Account Number: [id number] Date of Birth/Sex: [birth year] (54 y.o. Female) Treating RN: Cornell Barman Primary Care Physician: Ronette Deter Other Clinician: Referring Physician: Thersa Salt Treating Physician/Extender: Frann Rider in Treatment: 0 Foot Assessment Items Site Locations + = Sensation present, - = Sensation absent, C = Callus, U = Ulcer R = Redness, W = Warmth, M = Maceration, PU = Pre-ulcerative lesion F = Fissure, S = Swelling, D = Dryness Assessment Right: Left: Other Deformity: No No Prior Foot Ulcer: No No Prior Amputation: No No Charcot Joint: No No Ambulatory Status: Ambulatory Without Help Gait: Electronic Signature(s) Signed:  05/04/2015 5:49:44 PM By: Gretta Cool, RN, BSN, Kim RN, BSN Entered By: Gretta Cool, RN, BSN, Kim on 05/04/2015 10:26:18 Chelsea Barton ([id number]) -------------------------------------------------------------------------------- Nutrition Risk Assessment Details Patient Name: Chelsea Barton D. Date of Service: 05/04/2015 9:45 AM Medical Record Number: [id number] Patient Account Number: [id number] Date of Birth/Sex: 09-Jul-1960 (54 y.o. Female) Treating RN: Cornell Barman Primary Care Physician: Ronette Deter Other Clinician: Referring Physician: Thersa Salt Treating Physician/Extender: Frann Rider in Treatment: 0 Height (in): 65 Weight (lbs): 201 Body Mass Index (BMI): 33.4 Nutrition Risk Assessment Items NUTRITION RISK SCREEN: I have an illness or condition that made me change the kind and/or 0 No amount of food I eat I eat fewer than two meals per day 0 No I eat few fruits and vegetables, or milk products 0 No I have three or more drinks of beer, liquor or wine almost every day 0 No I have tooth or mouth problems that make it hard for me to eat 0 No I don't always have enough money to buy the food I need 0 No I eat alone most of the time 0 No I take three or more different prescribed or over-the-counter drugs a 0 No day Without wanting to, I have lost or gained 10 pounds in the last six 0 No months I am not always physically able to shop, cook and/or feed myself 0 No Nutrition Protocols Good Risk Protocol 0 No interventions needed Moderate Risk Protocol Electronic Signature(s) Signed: 05/04/2015 5:49:44 PM By: Gretta Cool, RN, BSN, Kim RN, BSN Entered By: Gretta Cool, RN, BSN, Kim on 05/04/2015 10:26:01

## 2015-05-15 ENCOUNTER — Ambulatory Visit (INDEPENDENT_AMBULATORY_CARE_PROVIDER_SITE_OTHER): Payer: Medicaid Other | Admitting: Internal Medicine

## 2015-05-15 ENCOUNTER — Encounter: Payer: Self-pay | Admitting: Internal Medicine

## 2015-05-15 VITALS — BP 156/78 | HR 74 | Temp 97.8°F | Ht 66.0 in | Wt 206.0 lb

## 2015-05-15 DIAGNOSIS — E0829 Diabetes mellitus due to underlying condition with other diabetic kidney complication: Secondary | ICD-10-CM | POA: Diagnosis not present

## 2015-05-15 DIAGNOSIS — IMO0002 Reserved for concepts with insufficient information to code with codable children: Secondary | ICD-10-CM

## 2015-05-15 DIAGNOSIS — I10 Essential (primary) hypertension: Secondary | ICD-10-CM | POA: Diagnosis not present

## 2015-05-15 DIAGNOSIS — E0865 Diabetes mellitus due to underlying condition with hyperglycemia: Secondary | ICD-10-CM

## 2015-05-15 DIAGNOSIS — S91301D Unspecified open wound, right foot, subsequent encounter: Secondary | ICD-10-CM

## 2015-05-15 MED ORDER — INSULIN GLARGINE 100 UNIT/ML SOLOSTAR PEN: 40.0000 [IU] | PEN_INJECTOR | Freq: Every day | SUBCUTANEOUS | Status: DC

## 2015-05-15 MED ORDER — ENALAPRIL MALEATE 10 MG PO TABS: 10.0000 mg | ORAL_TABLET | Freq: Every day | ORAL | Status: DC

## 2015-05-15 MED ORDER — CARVEDILOL 12.5 MG PO TABS: ORAL_TABLET | ORAL | Status: DC

## 2015-05-15 MED ORDER — SIMVASTATIN 20 MG PO TABS: 20.0000 mg | ORAL_TABLET | Freq: Every evening | ORAL | Status: DC

## 2015-05-15 NOTE — Assessment & Plan Note (Signed)
Wound appears to be healing. Will continue follow up at [place]. Continue Silvadene.

## 2015-05-15 NOTE — Assessment & Plan Note (Signed)
BG improved per pt report. Labs including A1c drawn today. Will continue Lantus, Novolog. Follow up with Endocrine as scheduled.

## 2015-05-15 NOTE — Assessment & Plan Note (Signed)
BP Readings from Last 3 Encounters:  05/15/15 156/78  05/01/15 136/78  01/13/15 84/51   BP elevated today. Encouraged better compliance with low sodium diet. Will continue current medications. Reviewed notes from Cardiology. Renal function with labs today at [facility].

## 2015-05-15 NOTE — Progress Notes (Signed)
Subjective:    Patient ID: Chelsea Barton, female    DOB: 01/03/1961, 54 y.o.   MRN: [id number]  HPI  54YO female presents for follow up.  Recently seen by Dr. Lacinda Axon for wound of her foot which occurred after stepping on a hot oven tray. Wound has been improving. They are applying Silvadene. No fever, chills.  DM - Labs ordered today by [facility]. BG mostly well controlled with a few lows near 50-70. Follow up scheduled with Endocrine on Friday. Compliant with medications, including Lantus 40units at bedtime.  HTN - Notes some increased salt intake. Compliant with medications. No chest pain, palpitations, headaches.   Wt Readings from Last 3 Encounters:  05/15/15 206 lb (93.441 kg)  05/01/15 204 lb 12 oz (92.874 kg)  01/13/15 200 lb 4 oz (90.833 kg)   BP Readings from Last 3 Encounters:  05/15/15 156/78  05/01/15 136/78  01/13/15 84/51    Past Medical History  Diagnosis Date  . Hyperlipidemia   . Hypertension   . Diabetes mellitus    No family history on file. No past surgical history on file. Social History   Social History  . Marital Status: Single    Spouse Name: N/A  . Number of Children: N/A  . Years of Education: N/A   Social History Main Topics  . Smoking status: Former Smoker    Quit date: 06/18/2005  . Smokeless tobacco: Never Used  . Alcohol Use: No  . Drug Use: No  . Sexual Activity: Not Asked   Other Topics Concern  . None   Social History Narrative    Review of Systems  Constitutional: Negative for fever, chills, appetite change, fatigue and unexpected weight change.  Eyes: Negative for visual disturbance.  Respiratory: Negative for shortness of breath.   Cardiovascular: Negative for chest pain and leg swelling.  Gastrointestinal: Negative for nausea, vomiting, abdominal pain, diarrhea and constipation.  Musculoskeletal: Negative for myalgias and arthralgias.  Skin: Positive for color change and wound. Negative for rash.    Hematological: Negative for adenopathy. Does not bruise/bleed easily.  Psychiatric/Behavioral: Negative for dysphoric mood. The patient is not nervous/anxious.        Objective:    BP 156/78 mmHg  Pulse 74  Temp(Src) 97.8 F (36.6 C) (Oral)  Ht 5\' 6"  (1.676 m)  Wt 206 lb (93.441 kg)  BMI 33.27 kg/m2  SpO2 97% Physical Exam  Constitutional: She is oriented to person, place, and time. She appears well-developed and well-nourished. No distress.  HENT:  Head: Normocephalic and atraumatic.  Right Ear: External ear normal.  Left Ear: External ear normal.  Nose: Nose normal.  Mouth/Throat: Oropharynx is clear and moist. No oropharyngeal exudate.  Eyes: Conjunctivae are normal. Pupils are equal, round, and reactive to light. Right eye exhibits no discharge. Left eye exhibits no discharge. No scleral icterus.  Neck: Normal range of motion. Neck supple. No tracheal deviation present. No thyromegaly present.  Cardiovascular: Normal rate, regular rhythm, normal heart sounds and intact distal pulses.  Exam reveals no gallop and no friction rub.   No murmur heard. Pulmonary/Chest: Effort normal and breath sounds normal. No respiratory distress. She has no wheezes. She has no rales. She exhibits no tenderness.  Musculoskeletal: Normal range of motion. She exhibits no edema or tenderness.  Lymphadenopathy:    She has no cervical adenopathy.  Neurological: She is alert and oriented to person, place, and time. No cranial nerve deficit. She exhibits normal muscle tone. Coordination normal.  Skin: Skin is warm and dry. No rash noted. She is not diaphoretic. No erythema. No pallor.     Psychiatric: She has a normal mood and affect. Her behavior is normal. Judgment and thought content normal.          Assessment & Plan:   Problem List Items Addressed This Visit      Unprioritized   Hypertension    BP Readings from Last 3 Encounters:  05/15/15 156/78  05/01/15 136/78  01/13/15 84/51    BP elevated today. Encouraged better compliance with low sodium diet. Will continue current medications. Reviewed notes from Cardiology. Renal function with labs today at [facility].      Relevant Medications   DIGOX 125 MCG tablet   simvastatin (ZOCOR) 20 MG tablet   enalapril (VASOTEC) 10 MG tablet   carvedilol (COREG) 12.5 MG tablet   Uncontrolled diabetes mellitus (HCC)    BG improved per pt report. Labs including A1c drawn today. Will continue Lantus, Novolog. Follow up with Endocrine as scheduled.      Relevant Medications   simvastatin (ZOCOR) 20 MG tablet   Insulin Glargine (LANTUS SOLOSTAR) 100 UNIT/ML Solostar Pen   enalapril (VASOTEC) 10 MG tablet   Wound, open, foot - Primary    Wound appears to be healing. Will continue follow up at [place]. Continue Silvadene.          Return in about 6 months (around 11/12/2015) for Physical.

## 2015-05-15 NOTE — Patient Instructions (Signed)
Follow up at [place] and with Endocrinology as scheduled.  Follow up here in 6 months and sooner as needed.

## 2015-05-18 ENCOUNTER — Encounter: Payer: Self-pay | Admitting: Podiatry

## 2015-05-18 ENCOUNTER — Ambulatory Visit (INDEPENDENT_AMBULATORY_CARE_PROVIDER_SITE_OTHER): Payer: Medicaid Other | Admitting: Podiatry

## 2015-05-18 ENCOUNTER — Encounter: Payer: Medicaid Other | Attending: Surgery | Admitting: Surgery

## 2015-05-18 DIAGNOSIS — B351 Tinea unguium: Secondary | ICD-10-CM

## 2015-05-18 DIAGNOSIS — E11621 Type 2 diabetes mellitus with foot ulcer: Secondary | ICD-10-CM | POA: Insufficient documentation

## 2015-05-18 DIAGNOSIS — M79676 Pain in unspecified toe(s): Secondary | ICD-10-CM | POA: Diagnosis not present

## 2015-05-18 DIAGNOSIS — L84 Corns and callosities: Secondary | ICD-10-CM | POA: Diagnosis not present

## 2015-05-18 DIAGNOSIS — Z87891 Personal history of nicotine dependence: Secondary | ICD-10-CM | POA: Diagnosis not present

## 2015-05-18 DIAGNOSIS — X58XXXA Exposure to other specified factors, initial encounter: Secondary | ICD-10-CM | POA: Insufficient documentation

## 2015-05-18 DIAGNOSIS — T25221A Burn of second degree of right foot, initial encounter: Secondary | ICD-10-CM | POA: Insufficient documentation

## 2015-05-18 DIAGNOSIS — E785 Hyperlipidemia, unspecified: Secondary | ICD-10-CM | POA: Diagnosis not present

## 2015-05-18 DIAGNOSIS — I10 Essential (primary) hypertension: Secondary | ICD-10-CM | POA: Diagnosis not present

## 2015-05-18 DIAGNOSIS — L97511 Non-pressure chronic ulcer of other part of right foot limited to breakdown of skin: Secondary | ICD-10-CM | POA: Diagnosis not present

## 2015-05-18 DIAGNOSIS — E669 Obesity, unspecified: Secondary | ICD-10-CM | POA: Diagnosis not present
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
## 2015-05-25 ENCOUNTER — Encounter: Payer: Medicaid Other | Admitting: Surgery

## 2015-05-25 DIAGNOSIS — E11621 Type 2 diabetes mellitus with foot ulcer: Secondary | ICD-10-CM | POA: Diagnosis not present
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
## 2015-05-31 NOTE — Progress Notes (Signed)
Chelsea Barton, Chelsea Barton ([id number]) Visit Report for 05/25/2015 Arrival Information Details Patient Name: Chelsea Barton, Chelsea Barton. Date of Service: 05/25/2015 8:15 AM Medical Record Number: [id number] Patient Account Number: [id number] Date of Birth/Sex: [birth year] (54 y.o. Female) Treating RN: Ahmed Prima Primary Care Physician: Ronette Deter Other Clinician: Referring Physician: Ronette Deter Treating Physician/Extender: Frann Rider in Treatment: 3 Visit Information History Since Last Visit All ordered tests and consults were completed: No Patient Arrived: Ambulatory Added or deleted any medications: No Arrival Time: 08:10 Any new allergies or adverse reactions: No Accompanied By: self Had a fall or experienced change in No Transfer Assistance: None activities of daily living that may affect Patient Identification Verified: Yes risk of falls: Secondary Verification Process Yes Signs or symptoms of abuse/neglect since last No Completed: visito Patient Requires Transmission- No Hospitalized since last visit: No Based Precautions: Pain Present Now: No Patient Has Alerts: Yes Patient Alerts: Patient on Blood Thinner Aspirin 81mg  Type II Diabetic Electronic Signature(s) Signed: 05/30/2015 3:59:50 PM By: Alric Quan Entered By: Alric Quan on 05/25/2015 08:11:14 [place], [place]. ([id number]) -------------------------------------------------------------------------------- Clinic Level of Care Assessment Details Patient Name: Chelsea Simmering D. Date of Service: 05/25/2015 8:15 AM Medical Record Number: [id number] Patient Account Number: [id number] Date of Birth/Sex: June 04, 1961 (54 y.o. Female) Treating RN: Carolyne Fiscal, Debi Primary Care Physician: Ronette Deter Other Clinician: Referring Physician: Ronette Deter Treating Physician/Extender: Frann Rider in Treatment: 3 Clinic Level of Care Assessment Items TOOL 4 Quantity Score []  - Use when only an  EandM is performed on FOLLOW-UP visit 0 ASSESSMENTS - Nursing Assessment / Reassessment []  - Reassessment of Co-morbidities (includes updates in patient status) 0 []  - Reassessment of Adherence to Treatment Plan 0 ASSESSMENTS - Wound and Skin Assessment / Reassessment X - Simple Wound Assessment / Reassessment - one wound 1 5 []  - Complex Wound Assessment / Reassessment - multiple wounds 0 []  - Dermatologic / Skin Assessment (not related to wound area) 0 ASSESSMENTS - Focused Assessment []  - Circumferential Edema Measurements - multi extremities 0 []  - Nutritional Assessment / Counseling / Intervention 0 []  - Lower Extremity Assessment (monofilament, tuning fork, pulses) 0 []  - Peripheral Arterial Disease Assessment (using hand held doppler) 0 ASSESSMENTS - Ostomy and/or Continence Assessment and Care []  - Incontinence Assessment and Management 0 []  - Ostomy Care Assessment and Management (repouching, etc.) 0 PROCESS - Coordination of Care X - Simple Patient / Family Education for ongoing care 1 15 []  - Complex (extensive) Patient / Family Education for ongoing care 0 []  - Staff obtains Programmer, systems, Records, Test Results / Process Orders 0 []  - Staff telephones HHA, Nursing Homes / Clarify orders / etc 0 []  - Routine Transfer to another Facility (non-emergent condition) 0 Barton, Chelsea D. ([id number]) []  - Routine Hospital Admission (non-emergent condition) 0 []  - New Admissions / Biomedical engineer / Ordering NPWT, Apligraf, etc. 0 []  - Emergency Hospital Admission (emergent condition) 0 []  - Simple Discharge Coordination 0 X - Complex (extensive) Discharge Coordination 1 15 PROCESS - Special Needs []  - Pediatric / Minor Patient Management 0 []  - Isolation Patient Management 0 []  - Hearing / Language / Visual special needs 0 []  - Assessment of Community assistance (transportation, D/C planning, etc.) 0 []  - Additional assistance / Altered mentation 0 []  - Support Surface(s)  Assessment (bed, cushion, seat, etc.) 0 INTERVENTIONS - Wound Cleansing / Measurement X - Simple Wound Cleansing - one wound 1 5 []  - Complex Wound Cleansing - multiple wounds 0 []  - Wound Imaging (  photographs - any number of wounds) 0 []  - Wound Tracing (instead of photographs) 0 X - Simple Wound Measurement - one wound 1 5 []  - Complex Wound Measurement - multiple wounds 0 INTERVENTIONS - Wound Dressings X - Small Wound Dressing one or multiple wounds 1 10 []  - Medium Wound Dressing one or multiple wounds 0 []  - Large Wound Dressing one or multiple wounds 0 X - Application of Medications - topical 1 5 []  - Application of Medications - injection 0 INTERVENTIONS - Miscellaneous []  - External ear exam 0 Barton, Chelsea D. ([id number]) []  - Specimen Collection (cultures, biopsies, blood, body fluids, etc.) 0 []  - Specimen(s) / Culture(s) sent or taken to Lab for analysis 0 []  - Patient Transfer (multiple staff / Harrel Lemon Lift / Similar devices) 0 []  - Simple Staple / Suture removal (25 or less) 0 []  - Complex Staple / Suture removal (26 or more) 0 []  - Hypo / Hyperglycemic Management (close monitor of Blood Glucose) 0 []  - Ankle / Brachial Index (ABI) - do not check if billed separately 0 X - Vital Signs 1 5 Has the patient been seen at the hospital within the last three years: Yes Total Score: 65 Level Of Care: New/Established - Level 2 Electronic Signature(s) Signed: 05/30/2015 3:59:50 PM By: Alric Quan Entered By: Alric Quan on 05/25/2015 08:28:01 Barton, Chelsea Schaumann ([id number]) -------------------------------------------------------------------------------- Encounter Discharge Information Details Patient Name: Chelsea Simmering D. Date of Service: 05/25/2015 8:15 AM Medical Record Number: [id number] Patient Account Number: [id number] Date of Birth/Sex: [birth year] (54 y.o. Female) Treating RN: Ahmed Prima Primary Care Physician: Ronette Deter Other Clinician: Referring  Physician: Ronette Deter Treating Physician/Extender: Frann Rider in Treatment: 3 Encounter Discharge Information Items Discharge Pain Level: 0 Discharge Condition: Stable Ambulatory Status: Ambulatory Discharge Destination: Home Transportation: Private Auto Accompanied By: self Schedule Follow-up Appointment: No Medication Reconciliation completed and provided to Patient/Care Yes Roald Lukacs: Provided on Clinical Summary of Care: 05/25/2015 Form Type Recipient Paper Patient SP Electronic Signature(s) Signed: 05/25/2015 8:32:44 AM By: Ruthine Dose Entered By: Ruthine Dose on 05/25/2015 08:32:44 Schools, Libra D. ([id number]) -------------------------------------------------------------------------------- Lower Extremity Assessment Details Patient Name: Chelsea Simmering D. Date of Service: 05/25/2015 8:15 AM Medical Record Number: [id number] Patient Account Number: [id number] Date of Birth/Sex: Jan 30, 1961 (54 y.o. Female) Treating RN: Carolyne Fiscal, Debi Primary Care Physician: Ronette Deter Other Clinician: Referring Physician: Ronette Deter Treating Physician/Extender: Frann Rider in Treatment: 3 Edema Assessment Assessed: [Left: No] [Right: No] Edema: [Left: N] [Right: o] Vascular Assessment Pulses: Posterior Tibial Dorsalis Pedis Palpable: [Right:Yes] Extremity colors, hair growth, and conditions: Extremity Color: [Right:Normal] Hair Growth on Extremity: [Right:Yes] Temperature of Extremity: [Right:Warm] Capillary Refill: [Right:< 3 seconds] Toe Nail Assessment Left: Right: Thick: No Discolored: No Deformed: No Improper Length and Hygiene: No Electronic Signature(s) Signed: 05/30/2015 3:59:50 PM By: Alric Quan Entered By: Alric Quan on 05/25/2015 08:19:03 Kreitz, Gray D. ([id number]) -------------------------------------------------------------------------------- Multi Wound Chart Details Patient Name: Chelsea Simmering D. Date of  Service: 05/25/2015 8:15 AM Medical Record Number: [id number] Patient Account Number: [id number] Date of Birth/Sex: Jun 08, 1961 (54 y.o. Female) Treating RN: Ahmed Prima Primary Care Physician: Ronette Deter Other Clinician: Referring Physician: Ronette Deter Treating Physician/Extender: Frann Rider in Treatment: 3 Vital Signs Height(in): 65 Pulse(bpm): 77 Weight(lbs): 201 Blood Pressure 121/65 (mmHg): Body Mass Index(BMI): 33 Temperature(F): 97.9 Respiratory Rate 20 (breaths/min): Photos: [1:No Photos] [N/A:N/A] Wound Location: [1:Foot - Plantar] [N/A:N/A] Wounding Event: [1:Thermal Burn] [N/A:N/A] Primary Etiology: [1:2nd degree Burn] [N/A:N/A] Comorbid History: [1:Cataracts, Glaucoma, Hypertension, Type II Diabetes, Neuropathy] [  N/A:N/A] Date Acquired: [1:04/17/2015] [N/A:N/A] Weeks of Treatment: [1:3] [N/A:N/A] Wound Status: [1:Healed - Epithelialized] [N/A:N/A] Measurements L x W x D 0x0x0 [N/A:N/A] (cm) Area (cm) : [1:0] [N/A:N/A] Volume (cm) : [1:0] [N/A:N/A] % Reduction in Area: [1:100.00%] [N/A:N/A] % Reduction in Volume: 100.00% [N/A:N/A] Classification: [1:Full Thickness Without Exposed Support Structures] [N/A:N/A] HBO Classification: [1:Grade 0] [N/A:N/A] Exudate Amount: [1:None Present] [N/A:N/A] Epithelialization: [1:Large (67-100%)] [N/A:N/A] Periwound Skin Texture: No Abnormalities Noted [N/A:N/A] Periwound Skin [1:No Abnormalities Noted] [N/A:N/A] Moisture: Periwound Skin Color: No Abnormalities Noted [N/A:N/A] Tenderness on [1:No] [N/A:N/A] Palpation: Wound Preparation: [1:Ulcer Cleansing: Rinsed/Irrigated with] [N/A:N/A] Saline Topical Anesthetic Applied: Other: lidocsine 4% Treatment Notes Electronic Signature(s) Signed: 05/30/2015 3:59:50 PM By: Alric Quan Entered By: Alric Quan on 05/25/2015 08:26:39 [name]  ([id number]) -------------------------------------------------------------------------------- Multi-Disciplinary Care Plan Details Patient Name: Chelsea Simmering D. Date of Service: 05/25/2015 8:15 AM Medical Record Number: [id number] Patient Account Number: [id number] Date of Birth/Sex: [birth year] (54 y.o. Female) Treating RN: Carolyne Fiscal, Debi Primary Care Physician: Ronette Deter Other Clinician: Referring Physician: Ronette Deter Treating Physician/Extender: Frann Rider in Treatment: 3 Active Inactive Electronic Signature(s) Signed: 05/30/2015 3:59:50 PM By: Alric Quan Entered By: Alric Quan on 05/25/2015 08:26:31 [place], Chelsea Schaumann ([id number]) -------------------------------------------------------------------------------- Pain Assessment Details Patient Name: Chelsea Simmering D. Date of Service: 05/25/2015 8:15 AM Medical Record Number: [id number] Patient Account Number: [id number] Date of Birth/Sex: 1960/08/11 (54 y.o. Female) Treating RN: Ahmed Prima Primary Care Physician: Ronette Deter Other Clinician: Referring Physician: Ronette Deter Treating Physician/Extender: Frann Rider in Treatment: 3 Active Problems Location of Pain Severity and Description of Pain Patient Has Paino No Site Locations With Dressing Change: No Pain Management and Medication Current Pain Management: Electronic Signature(s) Signed: 05/30/2015 3:59:50 PM By: Alric Quan Entered By: Alric Quan on 05/25/2015 08:11:24 [name] ([id number]) -------------------------------------------------------------------------------- Patient/Caregiver Education Details Patient Name: Chelsea Simmering D. Date of Service: 05/25/2015 8:15 AM Medical Record Number: [id number] Patient Account Number: [id number] Date of Birth/Gender: 04-Oct-1960 (54 y.o. Female) Treating RN: Ahmed Prima Primary Care Physician: Ronette Deter Other Clinician: Referring Physician:  Ronette Deter Treating Physician/Extender: Frann Rider in Treatment: 3 Education Assessment Education Provided To: Patient Education Topics Provided Wound/Skin Impairment: Handouts: Other: keep area clean and apply moisturizer daily Methods: Demonstration, Explain/Verbal Responses: State content correctly Electronic Signature(s) Signed: 05/30/2015 3:59:50 PM By: Alric Quan Entered By: Alric Quan on 05/25/2015 08:29:33 Puopolo, Maija D. ([id number]) -------------------------------------------------------------------------------- Wound Assessment Details Patient Name: Chelsea Simmering D. Date of Service: 05/25/2015 8:15 AM Medical Record Number: [id number] Patient Account Number: [id number] Date of Birth/Sex: 25-Dec-1960 (54 y.o. Female) Treating RN: Ahmed Prima Primary Care Physician: Ronette Deter Other Clinician: Referring Physician: Ronette Deter Treating Physician/Extender: Frann Rider in Treatment: 3 Wound Status Wound Number: 1 Primary 2nd degree Burn Etiology: Wound Location: Foot - Plantar Wound Healed - Epithelialized Wounding Event: Thermal Burn Status: Date Acquired: 04/17/2015 Comorbid Cataracts, Glaucoma, Hypertension, Weeks Of Treatment: 3 History: Type II Diabetes, Neuropathy Clustered Wound: No Photos Photo Uploaded By: Gretta Cool, RN, BSN, Kim on 05/25/2015 17:34:18 Wound Measurements Length: (cm) 0 Width: (cm) 0 Depth: (cm) 0 Area: (cm) 0 Volume: (cm) 0 % Reduction in Area: 100% % Reduction in Volume: 100% Epithelialization: Large (67-100%) Tunneling: No Undermining: No Wound Description Full Thickness Without Exposed Classification: Support Structures Diabetic Severity Grade 0 (Wagner): Exudate Amount: None Present Periwound Skin Texture Texture Color No Abnormalities Noted: No No Abnormalities Noted: No Moisture No Abnormalities Noted: No Kroft, Lashun D. ([id number]) Wound Preparation Ulcer  Cleansing: Rinsed/Irrigated with Saline Topical Anesthetic  Applied: Other: lidocsine 4%, Electronic Signature(s) Signed: 05/30/2015 3:59:50 PM By: Alric Quan Entered By: Alric Quan on 05/25/2015 08:25:52 [place], Chelsea Schaumann ([id number]) -------------------------------------------------------------------------------- Vitals Details Patient Name: Chelsea Simmering D. Date of Service: 05/25/2015 8:15 AM Medical Record Number: [id number] Patient Account Number: [id number] Date of Birth/Sex: [birth year] (54 y.o. Female) Treating RN: Carolyne Fiscal, Debi Primary Care Physician: Ronette Deter Other Clinician: Referring Physician: Ronette Deter Treating Physician/Extender: Frann Rider in Treatment: 3 Vital Signs Time Taken: 08:12 Temperature (F): 97.9 Height (in): 65 Pulse (bpm): 77 Weight (lbs): 201 Respiratory Rate (breaths/min): 20 Body Mass Index (BMI): 33.4 Blood Pressure (mmHg): 121/65 Reference Range: 80 - 120 mg / dl Electronic Signature(s) Signed: 05/30/2015 3:59:50 PM By: Alric Quan Entered By: Alric Quan on 05/25/2015 08:13:57

## 2015-06-27 DIAGNOSIS — I509 Heart failure, unspecified: Secondary | ICD-10-CM | POA: Diagnosis not present

## 2015-06-27 DIAGNOSIS — Z794 Long term (current) use of insulin: Secondary | ICD-10-CM | POA: Diagnosis not present

## 2015-06-27 DIAGNOSIS — E11319 Type 2 diabetes mellitus with unspecified diabetic retinopathy without macular edema: Secondary | ICD-10-CM | POA: Diagnosis not present

## 2015-08-10 ENCOUNTER — Telehealth: Payer: Self-pay | Admitting: Internal Medicine

## 2015-08-10 NOTE — Telephone Encounter (Signed)
I tried to call the patient to see how she is doing, not able to leave a message.  Will try again. thanks

## 2015-08-10 NOTE — Telephone Encounter (Signed)
Lisa [phone] from [facility] left a vm on the triage phone regarding pt BP at 194/94. Pt states at [facility] in the am today pt was told to go to the ED for eval, pt refused. Pt states she will rest and do what she needs to do for it to go down. Pt is on 2 BP medications. Thank you!

## 2015-08-11 NOTE — Telephone Encounter (Signed)
Tried to call a second time to check on patient. Not able to leave a VM.

## 2015-08-17 ENCOUNTER — Ambulatory Visit: Payer: Medicaid Other | Admitting: Podiatry

## 2015-08-25 ENCOUNTER — Telehealth: Payer: Self-pay | Admitting: Internal Medicine

## 2015-08-25 NOTE — Telephone Encounter (Signed)
[id number] called from [facility] regarding pt due for reverification need a verbal ok to go with that. Thank you!

## 2015-08-25 NOTE — Telephone Encounter (Signed)
Verbal Order given  

## 2015-08-25 NOTE — Telephone Encounter (Signed)
Pt needs verbal orders for Nursing Care for medication management, [facility].

## 2015-08-25 NOTE — Telephone Encounter (Signed)
Fine to proceed.

## 2015-10-04 LAB — HM DIABETES EYE EXAM

## 2015-10-10 ENCOUNTER — Telehealth: Payer: Self-pay | Admitting: Internal Medicine

## 2015-10-10 ENCOUNTER — Other Ambulatory Visit: Payer: Self-pay | Admitting: Internal Medicine

## 2015-10-10 DIAGNOSIS — Z1239 Encounter for other screening for malignant neoplasm of breast: Secondary | ICD-10-CM

## 2015-10-10 DIAGNOSIS — Z1231 Encounter for screening mammogram for malignant neoplasm of breast: Secondary | ICD-10-CM

## 2015-10-10 NOTE — Telephone Encounter (Signed)
Pt received a letter that was time for her yearly mammogram. Pt is requesting a referral from Dr. Gilford Rile to have her mammogram done.

## 2015-10-10 NOTE — Telephone Encounter (Signed)
Order placed

## 2015-10-11 ENCOUNTER — Encounter: Payer: Self-pay | Admitting: Internal Medicine

## 2015-10-25 ENCOUNTER — Telehealth: Payer: Self-pay | Admitting: Internal Medicine

## 2015-10-25 NOTE — Telephone Encounter (Signed)
Can you place an order for this

## 2015-10-25 NOTE — Telephone Encounter (Signed)
Please call this as a verbal

## 2015-10-25 NOTE — Telephone Encounter (Signed)
Renew home health order to refill her pill box every two weeks.

## 2015-10-25 NOTE — Telephone Encounter (Signed)
Ria Comment from [facility] notified.

## 2015-10-30 ENCOUNTER — Other Ambulatory Visit: Payer: Self-pay | Admitting: Internal Medicine

## 2015-11-17 ENCOUNTER — Ambulatory Visit (INDEPENDENT_AMBULATORY_CARE_PROVIDER_SITE_OTHER): Payer: Medicaid Other | Admitting: Internal Medicine

## 2015-11-17 ENCOUNTER — Telehealth: Payer: Self-pay

## 2015-11-17 ENCOUNTER — Encounter: Payer: Self-pay | Admitting: Internal Medicine

## 2015-11-17 VITALS — BP 138/68 | HR 63 | Ht 65.0 in | Wt 210.0 lb

## 2015-11-17 DIAGNOSIS — Z0000 Encounter for general adult medical examination without abnormal findings: Secondary | ICD-10-CM

## 2015-11-17 DIAGNOSIS — E1165 Type 2 diabetes mellitus with hyperglycemia: Secondary | ICD-10-CM

## 2015-11-17 DIAGNOSIS — IMO0001 Reserved for inherently not codable concepts without codable children: Secondary | ICD-10-CM

## 2015-11-17 LAB — CBC WITH DIFFERENTIAL/PLATELET
BASOS ABS: 0.1 10*3/uL (ref 0.0–0.1)
Basophils Relative: 0.6 % (ref 0.0–3.0)
EOS ABS: 0.6 10*3/uL (ref 0.0–0.7)
EOS PCT: 6.1 % — AB (ref 0.0–5.0)
HCT: 39 % (ref 36.0–46.0)
HEMOGLOBIN: 12.4 g/dL (ref 12.0–15.0)
LYMPHS ABS: 1.9 10*3/uL (ref 0.7–4.0)
Lymphocytes Relative: 19.8 % (ref 12.0–46.0)
MCHC: 31.8 g/dL (ref 30.0–36.0)
MCV: 81.6 fl (ref 78.0–100.0)
MONO ABS: 0.8 10*3/uL (ref 0.1–1.0)
Monocytes Relative: 7.8 % (ref 3.0–12.0)
NEUTROS PCT: 65.7 % (ref 43.0–77.0)
Neutro Abs: 6.4 10*3/uL (ref 1.4–7.7)
Platelets: 234 10*3/uL (ref 150.0–400.0)
RBC: 4.78 Mil/uL (ref 3.87–5.11)
RDW: 13.8 % (ref 11.5–15.5)
WBC: 9.7 10*3/uL (ref 4.0–10.5)

## 2015-11-17 LAB — LIPID PANEL
Cholesterol: 137 mg/dL (ref 0–200)
HDL: 45.7 mg/dL (ref >39.00)
LDL Cholesterol: 67 mg/dL (ref 0–99)
NONHDL: 91.23
Total CHOL/HDL Ratio: 3
Triglycerides: 119 mg/dL (ref 0.0–149.0)
VLDL: 23.8 mg/dL (ref 0.0–40.0)

## 2015-11-17 LAB — COMPREHENSIVE METABOLIC PANEL
ALBUMIN: 3.7 g/dL (ref 3.5–5.2)
ALK PHOS: 72 U/L (ref 39–117)
ALT: 12 U/L (ref 0–35)
AST: 13 U/L (ref 0–37)
BUN: 19 mg/dL (ref 6–23)
CO2: 31 mEq/L (ref 19–32)
Calcium: 9.5 mg/dL (ref 8.4–10.5)
Chloride: 105 mEq/L (ref 96–112)
Creatinine, Ser: 1.12 mg/dL (ref 0.40–1.20)
GFR: 64.66 mL/min (ref >60.00)
Glucose, Bld: 67 mg/dL — ABNORMAL LOW (ref 70–99)
Potassium: 4.5 mEq/L (ref 3.5–5.1)
SODIUM: 144 meq/L (ref 135–145)
TOTAL PROTEIN: 6.6 g/dL (ref 6.0–8.3)
Total Bilirubin: 0.5 mg/dL (ref 0.2–1.2)

## 2015-11-17 LAB — MICROALBUMIN / CREATININE URINE RATIO
Creatinine,U: 135 mg/dL
MICROALB UR: 88.4 mg/dL — AB (ref 0.0–1.9)
MICROALB/CREAT RATIO: 65.5 mg/g — AB (ref 0.0–30.0)

## 2015-11-17 LAB — HEMOGLOBIN A1C: HEMOGLOBIN A1C: 8.2 % — AB (ref 4.6–6.5)

## 2015-11-17 MED ORDER — SIMVASTATIN 20 MG PO TABS: 20.0000 mg | ORAL_TABLET | Freq: Every evening | ORAL | Status: DC

## 2015-11-17 MED ORDER — INSULIN GLARGINE 100 UNIT/ML SOLOSTAR PEN: 40.0000 [IU] | PEN_INJECTOR | Freq: Every day | SUBCUTANEOUS | Status: DC

## 2015-11-17 MED ORDER — DIGOX 125 MCG PO TABS: ORAL_TABLET | ORAL | Status: DC

## 2015-11-17 MED ORDER — ENALAPRIL MALEATE 10 MG PO TABS: ORAL_TABLET | ORAL | Status: DC

## 2015-11-17 MED ORDER — CARVEDILOL 12.5 MG PO TABS: ORAL_TABLET | ORAL | Status: DC

## 2015-11-17 NOTE — Assessment & Plan Note (Signed)
General medical exam normal today including breast exam. Mammogram scheduled. PAP and pelvic deferred as normal in 2014, HPV neg, plan repeat in 2019. Labs today. Encouraged healthy diet and exercise. Colonoscopy declined. Cologuard declined. Immunizations UTD.

## 2015-11-17 NOTE — Progress Notes (Signed)
Subjective:    Patient ID: Chelsea Barton, female    DOB: [birth year], 55 y.o.   MRN: [id number]  HPI  55YO female presents for annual exam.  Feeling well. BG running near 100-120s. Compliant with medication.  Mammogram is scheduled. Declines PAP.  Wt Readings from Last 3 Encounters:  11/17/15 210 lb (95.255 kg)  05/15/15 206 lb (93.441 kg)  05/01/15 204 lb 12 oz (92.874 kg)   BP Readings from Last 3 Encounters:  11/17/15 138/68  05/15/15 156/78  05/01/15 136/78    Past Medical History  Diagnosis Date  . Hyperlipidemia   . Hypertension   . Diabetes mellitus    No family history on file. No past surgical history on file. Social History   Social History  . Marital Status: Single    Spouse Name: N/A  . Number of Children: N/A  . Years of Education: N/A   Social History Main Topics  . Smoking status: Former Smoker    Quit date: 06/18/2005  . Smokeless tobacco: Never Used  . Alcohol Use: No  . Drug Use: No  . Sexual Activity: Not Asked   Other Topics Concern  . None   Social History Narrative    Review of Systems  Constitutional: Negative for fever, chills, appetite change, fatigue and unexpected weight change.  HENT: Negative for congestion.   Eyes: Negative for visual disturbance.  Respiratory: Negative for cough, chest tightness and shortness of breath.   Cardiovascular: Negative for chest pain and leg swelling.  Gastrointestinal: Negative for nausea, vomiting, abdominal pain, diarrhea and constipation.  Genitourinary: Negative for dysuria.  Musculoskeletal: Negative for myalgias and arthralgias.  Skin: Negative for color change and rash.  Neurological: Negative for weakness.  Hematological: Negative for adenopathy. Does not bruise/bleed easily.  Psychiatric/Behavioral: Negative for sleep disturbance and dysphoric mood. The patient is not nervous/anxious.        Objective:    BP 138/68 mmHg  Pulse 63  Ht 5\' 5"  (1.651 m)  Wt 210 lb (95.255 kg)   BMI 34.95 kg/m2  SpO2 94% Physical Exam  Constitutional: She is oriented to person, place, and time. She appears well-developed and well-nourished. No distress.  HENT:  Head: Normocephalic and atraumatic.  Right Ear: External ear normal.  Left Ear: External ear normal.  Nose: Nose normal.  Mouth/Throat: Oropharynx is clear and moist. No oropharyngeal exudate.  Eyes: Conjunctivae are normal. Pupils are equal, round, and reactive to light. Right eye exhibits no discharge. Left eye exhibits no discharge. No scleral icterus.  Neck: Normal range of motion. Neck supple. No tracheal deviation present. No thyromegaly present.  Cardiovascular: Normal rate, regular rhythm, normal heart sounds and intact distal pulses.  Exam reveals no gallop and no friction rub.   No murmur heard. Pulmonary/Chest: Effort normal and breath sounds normal. No accessory muscle usage. No tachypnea. No respiratory distress. She has no decreased breath sounds. She has no wheezes. She has no rales. She exhibits no tenderness. Right breast exhibits no inverted nipple, no mass, no nipple discharge, no skin change and no tenderness. Left breast exhibits no inverted nipple, no mass, no nipple discharge, no skin change and no tenderness. Breasts are symmetrical.  Abdominal: Soft. Bowel sounds are normal. She exhibits no distension and no mass. There is no tenderness. There is no rebound and no guarding.  Musculoskeletal: Normal range of motion. She exhibits no edema or tenderness.  Lymphadenopathy:    She has no cervical adenopathy.  Neurological: She is alert and  oriented to person, place, and time. No cranial nerve deficit. She exhibits normal muscle tone. Coordination normal.  Skin: Skin is warm and dry. No rash noted. She is not diaphoretic. No erythema. No pallor.  Psychiatric: She has a normal mood and affect. Her behavior is normal. Judgment and thought content normal.          Assessment & Plan:   Problem List Items  Addressed This Visit      Unprioritized   Routine general medical examination at a health care facility - Primary    General medical exam normal today including breast exam. Mammogram scheduled. PAP and pelvic deferred as normal in 2014, HPV neg, plan repeat in 2019. Labs today. Encouraged healthy diet and exercise. Colonoscopy declined. Cologuard declined. Immunizations UTD.      Relevant Orders   Comprehensive metabolic panel   Lipid panel   Microalbumin / creatinine urine ratio   CBC with Differential/Platelet   Uncontrolled diabetes mellitus ([place])    Will check A1c with labs. Follow up with Dr. Gabriel Carina as scheduled.      Relevant Medications   simvastatin (ZOCOR) 20 MG tablet   Insulin Glargine (LANTUS SOLOSTAR) 100 UNIT/ML Solostar Pen   enalapril (VASOTEC) 10 MG tablet   Other Relevant Orders   Hemoglobin A1c       Return in about 6 months (around 05/18/2016) for Recheck of Diabetes.  Ronette Deter, MD Internal Medicine [facility]

## 2015-11-17 NOTE — Assessment & Plan Note (Signed)
Will check A1c with labs. Follow up with Dr. Gabriel Carina as scheduled.

## 2015-11-17 NOTE — Telephone Encounter (Signed)
Spoke to patient about results. Patient understands and has no questions , comments, or concerns. Patient also stated that she has an appointment for diabetes with Dr. Purnell Shoemaker [id number].

## 2015-11-17 NOTE — Patient Instructions (Signed)
Health Maintenance, Female Adopting a healthy lifestyle and getting preventive care can go a long way to promote health and wellness. Talk with your health care provider about what schedule of regular examinations is right for you. This is a good chance for you to check in with your provider about disease prevention and staying healthy. In between checkups, there are plenty of things you can do on your own. Experts have done a lot of research about which lifestyle changes and preventive measures are most likely to keep you healthy. Ask your health care provider for more information. WEIGHT AND DIET  Eat a healthy diet  Be sure to include plenty of vegetables, fruits, low-fat dairy products, and lean protein.  Do not eat a lot of foods high in solid fats, added sugars, or salt.  Get regular exercise. This is one of the most important things you can do for your health.  Most adults should exercise for at least 150 minutes each week. The exercise should increase your heart rate and make you sweat (moderate-intensity exercise).  Most adults should also do strengthening exercises at least twice a week. This is in addition to the moderate-intensity exercise.  Maintain a healthy weight  Body mass index (BMI) is a measurement that can be used to identify possible weight problems. It estimates body fat based on height and weight. Your health care provider can help determine your BMI and help you achieve or maintain a healthy weight.  For females 20 years of age and older:   A BMI below 18.5 is considered underweight.  A BMI of 18.5 to 24.9 is normal.  A BMI of 25 to 29.9 is considered overweight.  A BMI of 30 and above is considered obese.  Watch levels of cholesterol and blood lipids  You should start having your blood tested for lipids and cholesterol at 56 years of age, then have this test every 5 years.  You may need to have your cholesterol levels checked more often if:  Your lipid  or cholesterol levels are high.  You are older than 56 years of age.  You are at high risk for heart disease.  CANCER SCREENING   Lung Cancer  Lung cancer screening is recommended for adults 55-80 years old who are at high risk for lung cancer because of a history of smoking.  A yearly low-dose CT scan of the lungs is recommended for people who:  Currently smoke.  Have quit within the past 15 years.  Have at least a 30-pack-year history of smoking. A pack year is smoking an average of one pack of cigarettes a day for 1 year.  Yearly screening should continue until it has been 15 years since you quit.  Yearly screening should stop if you develop a health problem that would prevent you from having lung cancer treatment.  Breast Cancer  Practice breast self-awareness. This means understanding how your breasts normally appear and feel.  It also means doing regular breast self-exams. Let your health care provider know about any changes, no matter how small.  If you are in your 20s or 30s, you should have a clinical breast exam (CBE) by a health care provider every 1-3 years as part of a regular health exam.  If you are 40 or older, have a CBE every year. Also consider having a breast X-ray (mammogram) every year.  If you have a family history of breast cancer, talk to your health care provider about genetic screening.  If you   are at high risk for breast cancer, talk to your health care provider about having an MRI and a mammogram every year.  Breast cancer gene (BRCA) assessment is recommended for women who have family members with BRCA-related cancers. BRCA-related cancers include:  Breast.  Ovarian.  Tubal.  Peritoneal cancers.  Results of the assessment will determine the need for genetic counseling and BRCA1 and BRCA2 testing. Cervical Cancer Your health care provider may recommend that you be screened regularly for cancer of the pelvic organs (ovaries, uterus, and  vagina). This screening involves a pelvic examination, including checking for microscopic changes to the surface of your cervix (Pap test). You may be encouraged to have this screening done every 3 years, beginning at age 21.  For women ages 30-65, health care providers may recommend pelvic exams and Pap testing every 3 years, or they may recommend the Pap and pelvic exam, combined with testing for human papilloma virus (HPV), every 5 years. Some types of HPV increase your risk of cervical cancer. Testing for HPV may also be done on women of any age with unclear Pap test results.  Other health care providers may not recommend any screening for nonpregnant women who are considered low risk for pelvic cancer and who do not have symptoms. Ask your health care provider if a screening pelvic exam is right for you.  If you have had past treatment for cervical cancer or a condition that could lead to cancer, you need Pap tests and screening for cancer for at least 20 years after your treatment. If Pap tests have been discontinued, your risk factors (such as having a new sexual partner) need to be reassessed to determine if screening should resume. Some women have medical problems that increase the chance of getting cervical cancer. In these cases, your health care provider may recommend more frequent screening and Pap tests. Colorectal Cancer  This type of cancer can be detected and often prevented.  Routine colorectal cancer screening usually begins at 56 years of age and continues through 56 years of age.  Your health care provider may recommend screening at an earlier age if you have risk factors for colon cancer.  Your health care provider may also recommend using home test kits to check for hidden blood in the stool.  A small camera at the end of a tube can be used to examine your colon directly (sigmoidoscopy or colonoscopy). This is done to check for the earliest forms of colorectal  cancer.  Routine screening usually begins at age 50.  Direct examination of the colon should be repeated every 5-10 years through 56 years of age. However, you may need to be screened more often if early forms of precancerous polyps or small growths are found. Skin Cancer  Check your skin from head to toe regularly.  Tell your health care provider about any new moles or changes in moles, especially if there is a change in a mole's shape or color.  Also tell your health care provider if you have a mole that is larger than the size of a pencil eraser.  Always use sunscreen. Apply sunscreen liberally and repeatedly throughout the day.  Protect yourself by wearing long sleeves, pants, a wide-brimmed hat, and sunglasses whenever you are outside. HEART DISEASE, DIABETES, AND HIGH BLOOD PRESSURE   High blood pressure causes heart disease and increases the risk of stroke. High blood pressure is more likely to develop in:  People who have blood pressure in the high end   of the normal range (130-139/85-89 mm Hg).  People who are overweight or obese.  People who are African American.  If you are 38-23 years of age, have your blood pressure checked every 3-5 years. If you are 61 years of age or older, have your blood pressure checked every year. You should have your blood pressure measured twice--once when you are at a hospital or clinic, and once when you are not at a hospital or clinic. Record the average of the two measurements. To check your blood pressure when you are not at a hospital or clinic, you can use:  An automated blood pressure machine at a pharmacy.  A home blood pressure monitor.  If you are between 45 years and 39 years old, ask your health care provider if you should take aspirin to prevent strokes.  Have regular diabetes screenings. This involves taking a blood sample to check your fasting blood sugar level.  If you are at a normal weight and have a low risk for diabetes,  have this test once every three years after 56 years of age.  If you are overweight and have a high risk for diabetes, consider being tested at a younger age or more often. PREVENTING INFECTION  Hepatitis B  If you have a higher risk for hepatitis B, you should be screened for this virus. You are considered at high risk for hepatitis B if:  You were born in a country where hepatitis B is common. Ask your health care provider which countries are considered high risk.  Your parents were born in a high-risk country, and you have not been immunized against hepatitis B (hepatitis B vaccine).  You have HIV or AIDS.  You use needles to inject street drugs.  You live with someone who has hepatitis B.  You have had sex with someone who has hepatitis B.  You get hemodialysis treatment.  You take certain medicines for conditions, including cancer, organ transplantation, and autoimmune conditions. Hepatitis C  Blood testing is recommended for:  Everyone born from [birth year] through 1966.  Anyone with known risk factors for hepatitis C. Sexually transmitted infections (STIs)  You should be screened for sexually transmitted infections (STIs) including gonorrhea and chlamydia if:  You are sexually active and are younger than 56 years of age.  You are older than 56 years of age and your health care provider tells you that you are at risk for this type of infection.  Your sexual activity has changed since you were last screened and you are at an increased risk for chlamydia or gonorrhea. Ask your health care provider if you are at risk.  If you do not have HIV, but are at risk, it may be recommended that you take a prescription medicine daily to prevent HIV infection. This is called pre-exposure prophylaxis (PrEP). You are considered at risk if:  You are sexually active and do not regularly use condoms or know the HIV status of your partner(s).  You take drugs by injection.  You are sexually  active with a partner who has HIV. Talk with your health care provider about whether you are at high risk of being infected with HIV. If you choose to begin PrEP, you should first be tested for HIV. You should then be tested every 3 months for as long as you are taking PrEP.  PREGNANCY   If you are premenopausal and you may become pregnant, ask your health care provider about preconception counseling.  If you may  become pregnant, take 400 to 800 micrograms (mcg) of folic acid every day.  If you want to prevent pregnancy, talk to your health care provider about birth control (contraception). OSTEOPOROSIS AND MENOPAUSE   Osteoporosis is a disease in which the bones lose minerals and strength with aging. This can result in serious bone fractures. Your risk for osteoporosis can be identified using a bone density scan.  If you are 61 years of age or older, or if you are at risk for osteoporosis and fractures, ask your health care provider if you should be screened.  Ask your health care provider whether you should take a calcium or vitamin D supplement to lower your risk for osteoporosis.  Menopause may have certain physical symptoms and risks.  Hormone replacement therapy may reduce some of these symptoms and risks. Talk to your health care provider about whether hormone replacement therapy is right for you.  HOME CARE INSTRUCTIONS   Schedule regular health, dental, and eye exams.  Stay current with your immunizations.   Do not use any tobacco products including cigarettes, chewing tobacco, or electronic cigarettes.  If you are pregnant, do not drink alcohol.  If you are breastfeeding, limit how much and how often you drink alcohol.  Limit alcohol intake to no more than 1 drink per day for nonpregnant women. One drink equals 12 ounces of beer, 5 ounces of wine, or 1 ounces of hard liquor.  Do not use street drugs.  Do not share needles.  Ask your health care provider for help if  you need support or information about quitting drugs.  Tell your health care provider if you often feel depressed.  Tell your health care provider if you have ever been abused or do not feel safe at home.   This information is not intended to replace advice given to you by your health care provider. Make sure you discuss any questions you have with your health care provider.   Document Released: 12/17/2010 Document Revised: 06/24/2014 Document Reviewed: 05/05/2013 Elsevier Interactive Patient Education [employer].

## 2015-11-17 NOTE — Addendum Note (Signed)
Addended by: Frutoso Chase A on: 11/17/2015 08:48 AM   Modules accepted: Miquel Dunn

## 2015-11-17 NOTE — Addendum Note (Signed)
Addended byElpidio Galea T on: 11/17/2015 04:32 PM   Modules accepted: Miquel Dunn

## 2015-11-23 ENCOUNTER — Ambulatory Visit: Payer: Medicaid Other

## 2015-12-07 ENCOUNTER — Ambulatory Visit (INDEPENDENT_AMBULATORY_CARE_PROVIDER_SITE_OTHER): Payer: Medicaid Other | Admitting: Podiatry

## 2015-12-07 DIAGNOSIS — B351 Tinea unguium: Secondary | ICD-10-CM | POA: Diagnosis not present

## 2015-12-07 DIAGNOSIS — M79676 Pain in unspecified toe(s): Secondary | ICD-10-CM | POA: Diagnosis not present

## 2015-12-07 DIAGNOSIS — E1149 Type 2 diabetes mellitus with other diabetic neurological complication: Secondary | ICD-10-CM

## 2015-12-07 NOTE — Progress Notes (Signed)
Patient ID: TALEASHA LUTTRULL, female   DOB: 1961-02-04, 55 y.o.   MRN: [id number]  Subjective: 55 y.o.-year-old female returns the office today for painful, elongated, thickened toenails which she is unable to trim herself. Denies any redness or drainage around the nails. Denies any systemic complaints such as fevers, chills, nausea, vomiting.   Objective: AAO 3, NAD  DP/PT pulses palpable bilaterally, CRT less than 3 seconds Protective sensation decreased with Simms Weinstein monofilament Nails hypertrophic, dystrophic, elongated, brittle, discolored 10. There is tenderness overlying these nails. There is no surrounding erythema or drainage along the nail sites. No other areas of tenderness bilateral lower extremities. No overlying edema, erythema, increased warmth. No pain with calf compression, swelling, warmth, erythema.  Assessment: Patient presents with symptomatic onychomycosis  Plan: -Treatment options including alternatives, risks, complications were discussed -Nails sharply debrided 10 without complication/bleeding. -Discussed daily foot inspection. If there are any changes, to call the office immediately.  -Follow-up in 4 months per her request or sooner if any problems are to arise. In the meantime, encouraged to call the office with any questions, concerns, changes symptoms.   Celesta Gentile, DPM

## 2015-12-08 ENCOUNTER — Ambulatory Visit
Admission: RE | Admit: 2015-12-08 | Discharge: 2015-12-08 | Disposition: A | Discharge status: Home / Self Care | Payer: Medicaid Other | Source: Ambulatory Visit | Attending: Internal Medicine | Admitting: Internal Medicine

## 2015-12-08 DIAGNOSIS — Z1239 Encounter for other screening for malignant neoplasm of breast: Secondary | ICD-10-CM

## 2015-12-08 DIAGNOSIS — Z1231 Encounter for screening mammogram for malignant neoplasm of breast: Secondary | ICD-10-CM | POA: Diagnosis present

## 2015-12-15 ENCOUNTER — Other Ambulatory Visit: Payer: Self-pay | Admitting: Internal Medicine

## 2015-12-22 ENCOUNTER — Telehealth: Payer: Self-pay | Admitting: Internal Medicine

## 2015-12-22 NOTE — Telephone Encounter (Signed)
Spoke to lindsey and gave the verbal order per Dr. Gilford Rile.

## 2015-12-22 NOTE — Telephone Encounter (Signed)
Please advise 

## 2015-12-22 NOTE — Telephone Encounter (Signed)
Chelsea Barton [id number] called from [facility] regarding needing a verbal order for to receritify pt for more home health for 60 days. A vm can be left it's secure. Thank you!

## 2016-02-23 ENCOUNTER — Telehealth: Payer: Self-pay | Admitting: *Deleted

## 2016-02-23 NOTE — Telephone Encounter (Signed)
Left detailed message on lindsey phone stated that if she were to have any questions return phone call back.

## 2016-02-23 NOTE — Telephone Encounter (Signed)
Yes

## 2016-02-23 NOTE — Telephone Encounter (Signed)
Ok to do so? Please advise.

## 2016-02-23 NOTE — Telephone Encounter (Signed)
[facility] will need a verbal order for a 60 day pill box refill  Mendel Ryder [phone]

## 2016-03-19 ENCOUNTER — Other Ambulatory Visit: Payer: Self-pay

## 2016-03-19 ENCOUNTER — Telehealth: Payer: Self-pay | Admitting: Family

## 2016-03-19 MED ORDER — ENALAPRIL MALEATE 10 MG PO TABS: ORAL_TABLET | ORAL | 0 refills | Status: DC

## 2016-03-19 MED ORDER — CARVEDILOL 12.5 MG PO TABS: ORAL_TABLET | ORAL | 0 refills | Status: DC

## 2016-03-19 MED ORDER — INSULIN ASPART 100 UNIT/ML ~~LOC~~ SOLN: SUBCUTANEOUS | 11 refills | Status: DC

## 2016-03-19 MED ORDER — DIGOX 125 MCG PO TABS: ORAL_TABLET | ORAL | 0 refills | Status: DC

## 2016-03-19 MED ORDER — SIMVASTATIN 20 MG PO TABS: ORAL_TABLET | ORAL | 0 refills | Status: DC

## 2016-03-19 MED ORDER — INSULIN PEN NEEDLE 31G X 8 MM MISC: 6 refills | Status: DC

## 2016-03-19 NOTE — Telephone Encounter (Signed)
Pt called requesting refills on carvedilol (COREG) 12.5 MG tablet, DIGOX 125 MCG tablet, enalapril (VASOTEC) 10 MG tablet, insulin aspart (NOVOLOG) 100 UNIT/ML injection, simvastatin (ZOCOR) 20 MG tablet, and Insulin Pen Needle (B-D ULTRAFINE III SHORT PEN) 31G X 8 MM MISC. Pt stated that the pharmacy sent over a request for the above medications. She only has enough meds till Friday. Thank you!  Pharmacy - [facility] [place], [place] - [address]  Call pt @ [phone]

## 2016-03-19 NOTE — Telephone Encounter (Signed)
Medication refilled

## 2016-04-11 ENCOUNTER — Ambulatory Visit: Payer: Medicaid Other | Admitting: Podiatry

## 2016-04-18 ENCOUNTER — Ambulatory Visit: Payer: Medicaid Other | Admitting: Podiatry

## 2016-04-24 ENCOUNTER — Telehealth: Payer: Self-pay | Admitting: *Deleted

## 2016-04-24 NOTE — Telephone Encounter (Signed)
Please advise 

## 2016-04-24 NOTE — Telephone Encounter (Signed)
[facility] has requested a re certification to come into the home to fill medications every 2 weeks

## 2016-04-25 NOTE — Telephone Encounter (Signed)
This does not make sense. We do not refill medications every 2 weeks.

## 2016-05-01 NOTE — Telephone Encounter (Signed)
Left message to call for [facility]

## 2016-05-02 ENCOUNTER — Ambulatory Visit (INDEPENDENT_AMBULATORY_CARE_PROVIDER_SITE_OTHER): Payer: Medicaid Other | Admitting: Podiatry

## 2016-05-02 DIAGNOSIS — B351 Tinea unguium: Secondary | ICD-10-CM

## 2016-05-02 DIAGNOSIS — M79676 Pain in unspecified toe(s): Secondary | ICD-10-CM

## 2016-05-02 DIAGNOSIS — E1149 Type 2 diabetes mellitus with other diabetic neurological complication: Secondary | ICD-10-CM

## 2016-05-02 DIAGNOSIS — L84 Corns and callosities: Secondary | ICD-10-CM

## 2016-05-02 NOTE — Progress Notes (Signed)
Patient ID: CHALICE PHILBERT, female   DOB: Mar 20, 1961, 55 y.o.   MRN: [id number]  Subjective: 55 y.o.-year-old female returns the office today for painful, elongated, thickened toenails which she is unable to trim herself. Also has a callus of the right foot. Denies any redness or drainage. Denies any redness or drainage around the nails. Denies any systemic complaints such as fevers, chills, nausea, vomiting.   Objective: AAO 3, NAD DP/PT pulses palpable bilaterally, CRT less than 3 seconds Protective sensation decreased with Simms Weinstein monofilament Nails hypertrophic, dystrophic, elongated, brittle, discolored 10. There is tenderness overlying these nails. There is no surrounding erythema or drainage along the nail sites. Hyperkeratotic lesion right lateral heel. No underlying ulceration, drainage or any signs of infection. Mild epidermolysis of left second toe. Is no open sores is no swelling or redness. No drainage. No clinical signs of infection otherwise. No other areas of tenderness bilateral lower extremities. No overlying edema, erythema, increased warmth. No pain with calf compression, swelling, warmth, erythema.  Assessment: Patient presents with symptomatic onychomycosis; hyperkeratotic lesion  Plan: -Treatment options including alternatives, risks, complications were discussed -Nails sharply debrided 10 without complication/bleeding. Hyperkeratotic lesion debrided 1 without complications or bleeding -Monitor her second toe any swelling or redness or other signs of infection.  -Discussed da3ly foot inspection. If there are any changes, to call the office immediately.  -Follow-up in 4 months or sooner if any problems are to arise. In the meantime, encouraged to call the office with any questions, concerns, changes symptoms.   Celesta Gentile, DPM

## 2016-05-21 ENCOUNTER — Ambulatory Visit: Payer: Medicaid Other | Admitting: Internal Medicine

## 2016-05-22 ENCOUNTER — Ambulatory Visit (INDEPENDENT_AMBULATORY_CARE_PROVIDER_SITE_OTHER): Payer: Medicaid Other | Admitting: Family

## 2016-05-22 ENCOUNTER — Encounter: Payer: Self-pay | Admitting: Family

## 2016-05-22 VITALS — BP 126/66 | HR 67 | Temp 97.8°F | Ht 65.0 in | Wt 213.4 lb

## 2016-05-22 DIAGNOSIS — IMO0001 Reserved for inherently not codable concepts without codable children: Secondary | ICD-10-CM

## 2016-05-22 DIAGNOSIS — E1165 Type 2 diabetes mellitus with hyperglycemia: Secondary | ICD-10-CM

## 2016-05-22 DIAGNOSIS — I10 Essential (primary) hypertension: Secondary | ICD-10-CM | POA: Diagnosis not present

## 2016-05-22 LAB — HEMOGLOBIN A1C: HEMOGLOBIN A1C: 8.9 % — AB (ref 4.6–6.5)

## 2016-05-22 NOTE — Progress Notes (Signed)
Subjective:    Patient ID: Chelsea Barton, female    DOB: April 05, 1961, 55 y.o.   MRN: [id number]  CC: Chelsea Barton is a 55 y.o. female who presents today for follow up.   HPI: HTN- Compliant with medication. Denies exertional chest pain or pressure, numbness or tingling radiating to left arm or jaw, palpitations, dizziness, frequent headaches, changes in vision, or shortness of breath. follows with cardiology. No LE swelling. Walking everyday and no SOB.   DM- last a1c 8.2. Fasting BS today 120. Improving. Takes 45 units lantus qhs.  Novolog  6 breakfast 10  lunch 10 dinner   CPE 2017.      HISTORY:  Past Medical History:  Diagnosis Date  . Diabetes mellitus   . Hyperlipidemia   . Hypertension    History reviewed. No pertinent surgical history. Family History  Problem Relation Age of Onset  . Diabetes Sister     3/5 sisters have DM  . Breast cancer Neg Hx   . Colon cancer Neg Hx     Allergies: Patient has no known allergies. Current Outpatient Prescriptions on File Prior to Visit  Medication Sig Dispense Refill  . aspirin 81 MG tablet Take 81 mg by mouth daily.      . carvedilol (COREG) 12.5 MG tablet TAKE 1 TABLET BY MOUTH TWICE DAILY WITH A MEAL 180 tablet 0  . DIGOX 125 MCG tablet TK 1 T PO QD 90 tablet 0  . enalapril (VASOTEC) 10 MG tablet TAKE 1 TABLET(10 MG) BY MOUTH DAILY 90 tablet 0  . insulin aspart (NOVOLOG) 100 UNIT/ML injection Inject 6 units at breakfast, 10 units at lunch, and 10 units at supper 1 vial 11  . Insulin Glargine (LANTUS SOLOSTAR) 100 UNIT/ML Solostar Pen Inject 40 Units into the skin at bedtime. 10 pen 6  . Insulin Pen Needle (B-D ULTRAFINE III SHORT PEN) 31G X 8 MM MISC USE AS DIRECTED 100 each 6  . Multiple Vitamin (MULTIVITAMIN) capsule Take 1 capsule by mouth daily.      . simvastatin (ZOCOR) 20 MG tablet Take 1 tablet (20 mg total) by mouth every evening. 90 tablet 1  . simvastatin (ZOCOR) 20 MG tablet TAKE 1 TABLET(20 MG) BY MOUTH  EVERY EVENING 90 tablet 0  . vitamin D, CHOLECALCIFEROL, 400 UNITS tablet Take 400 Units by mouth 2 (two) times daily.       No current facility-administered medications on file prior to visit.     Social History  Substance Use Topics  . Smoking status: Former Smoker    Quit date: 06/18/2005  . Smokeless tobacco: Never Used  . Alcohol use No    Review of Systems  Constitutional: Negative for chills and fever.  Respiratory: Negative for cough.   Cardiovascular: Negative for chest pain and palpitations.  Gastrointestinal: Negative for nausea and vomiting.      Objective:    BP 126/66   Pulse 67   Temp 97.8 F (36.6 C) (Oral)   Ht 5\' 5"  (1.651 m)   Wt 213 lb 6.4 oz (96.8 kg)   SpO2 97%   BMI 35.51 kg/m  BP Readings from Last 3 Encounters:  05/22/16 126/66  11/17/15 138/68  05/15/15 (!) 156/78   Wt Readings from Last 3 Encounters:  05/22/16 213 lb 6.4 oz (96.8 kg)  11/17/15 210 lb (95.3 kg)  05/15/15 206 lb (93.4 kg)    Physical Exam  Constitutional: She appears well-developed and well-nourished.  Eyes: Conjunctivae are normal.  Cardiovascular: Normal rate, regular rhythm, normal heart sounds and normal pulses.   Pulmonary/Chest: Effort normal and breath sounds normal. She has no wheezes. She has no rhonchi. She has no rales.  Neurological: She is alert.  Skin: Skin is warm and dry.  Psychiatric: She has a normal mood and affect. Her speech is normal and behavior is normal. Thought content normal.  Vitals reviewed.      Assessment & Plan:   Problem List Items Addressed This Visit      Cardiovascular and Mediastinum   Hypertension    At goal. Will continue current regimen.         Endocrine   Uncontrolled diabetes mellitus ([place]) - Primary    Pending a1c. Education regarding DM. F/u 3 months.       Relevant Orders   Hemoglobin A1c   Hepatitis C antibody   HIV antibody       I am having Ms. Newhard maintain her aspirin, multivitamin, vitamin D  (CHOLECALCIFEROL), simvastatin, Insulin Glargine, carvedilol, DIGOX, enalapril, insulin aspart, Insulin Pen Needle, and simvastatin.   No orders of the defined types were placed in this encounter.   Return precautions given.   Risks, benefits, and alternatives of the medications and treatment plan prescribed today were discussed, and patient expressed understanding.   Education regarding symptom management and diagnosis given to patient on AVS.  Continue to follow with Mable Paris, FNP for routine health maintenance.   Shazia D Gammon and I agreed with plan.   Mable Paris, FNP

## 2016-05-22 NOTE — Assessment & Plan Note (Signed)
Pending a1c. Education regarding DM. F/u 3 months.

## 2016-05-22 NOTE — Patient Instructions (Signed)
Pleasure seeing you.    Carbohydrate Counting for Diabetes Mellitus, Adult Carbohydrate counting is a method for keeping track of how many carbohydrates you eat. Eating carbohydrates naturally increases the amount of sugar (glucose) in the blood. Counting how many carbohydrates you eat helps keep your blood glucose within normal limits, which helps you manage your diabetes (diabetes mellitus). It is important to know how many carbohydrates you can safely have in each meal. This is different for every person. A diet and nutrition specialist (registered dietitian) can help you make a meal plan and calculate how many carbohydrates you should have at each meal and snack. Carbohydrates are found in the following foods:  Grains, such as breads and cereals.  Dried beans and soy products.  Starchy vegetables, such as potatoes, peas, and corn.  Fruit and fruit juices.  Milk and yogurt.  Sweets and snack foods, such as cake, cookies, candy, chips, and soft drinks. How do I count carbohydrates? There are two ways to count carbohydrates in food. You can use either of the methods or a combination of both. Reading "Nutrition Facts" on packaged food  The "Nutrition Facts" list is included on the labels of almost all packaged foods and beverages in the [place] It includes:  The serving size.  Information about nutrients in each serving, including the grams (g) of carbohydrate per serving. To use the "Nutrition Facts":  Decide how many servings you will have.  Multiply the number of servings by the number of carbohydrates per serving.  The resulting number is the total amount of carbohydrates that you will be having. Learning standard serving sizes of other foods  When you eat foods containing carbohydrates that are not packaged or do not include "Nutrition Facts" on the label, you need to measure the servings in order to count the amount of carbohydrates:  Measure the foods that you will eat with a  food scale or measuring cup, if needed.  Decide how many standard-size servings you will eat.  Multiply the number of servings by 15. Most carbohydrate-rich foods have about 15 g of carbohydrates per serving.  For example, if you eat 8 oz (170 g) of strawberries, you will have eaten 2 servings and 30 g of carbohydrates (2 servings x 15 g = 30 g).  For foods that have more than one food mixed, such as soups and casseroles, you must count the carbohydrates in each food that is included. The following list contains standard serving sizes of common carbohydrate-rich foods. Each of these servings has about 15 g of carbohydrates:   hamburger bun or  English muffin.   oz (15 mL) syrup.   oz (14 g) jelly.  1 slice of bread.  1 six-inch tortilla.  3 oz (85 g) cooked rice or pasta.  4 oz (113 g) cooked dried beans.  4 oz (113 g) starchy vegetable, such as peas, corn, or potatoes.  4 oz (113 g) hot cereal.  4 oz (113 g) mashed potatoes or  of a large baked potato.  4 oz (113 g) canned or frozen fruit.  4 oz (120 mL) fruit juice.  4-6 crackers.  6 chicken nuggets.  6 oz (170 g) unsweetened dry cereal.  6 oz (170 g) plain fat-free yogurt or yogurt sweetened with artificial sweeteners.  8 oz (240 mL) milk.  8 oz (170 g) fresh fruit or one small piece of fruit.  24 oz (680 g) popped popcorn. Example of carbohydrate counting Sample meal  3 oz (85  g) chicken breast.  6 oz (170 g) brown rice.  4 oz (113 g) corn.  8 oz (240 mL) milk.  8 oz (170 g) strawberries with sugar-free whipped topping. Carbohydrate calculation 1. Identify the foods that contain carbohydrates:  Rice.  Corn.  Milk.  Strawberries. 2. Calculate how many servings you have of each food:  2 servings rice.  1 serving corn.  1 serving milk.  1 serving strawberries. 3. Multiply each number of servings by 15 g:  2 servings rice x 15 g = 30 g.  1 serving corn x 15 g = 15 g.  1 serving  milk x 15 g = 15 g.  1 serving strawberries x 15 g = 15 g. 4. Add together all of the amounts to find the total grams of carbohydrates eaten:  30 g + 15 g + 15 g + 15 g = 75 g of carbohydrates total. This information is not intended to replace advice given to you by your health care provider. Make sure you discuss any questions you have with your health care provider. Document Released: 06/03/2005 Document Revised: 12/22/2015 Document Reviewed: 11/15/2015 Elsevier Interactive Patient Education  2017 [employer].

## 2016-05-22 NOTE — Progress Notes (Signed)
Pre visit review using our clinic review tool, if applicable. No additional management support is needed unless otherwise documented below in the visit note. 

## 2016-05-22 NOTE — Assessment & Plan Note (Signed)
At goal. Will continue current regimen.  

## 2016-05-23 LAB — HEPATITIS C ANTIBODY: HCV AB: NEGATIVE

## 2016-05-23 LAB — HIV ANTIBODY (ROUTINE TESTING W REFLEX): HIV 1&2 Ab, 4th Generation: NONREACTIVE

## 2016-06-06 ENCOUNTER — Encounter: Payer: Self-pay | Admitting: Family

## 2016-06-06 ENCOUNTER — Ambulatory Visit (INDEPENDENT_AMBULATORY_CARE_PROVIDER_SITE_OTHER): Payer: Medicaid Other | Admitting: Family

## 2016-06-06 DIAGNOSIS — E1039 Type 1 diabetes mellitus with other diabetic ophthalmic complication: Secondary | ICD-10-CM

## 2016-06-06 DIAGNOSIS — I10 Essential (primary) hypertension: Secondary | ICD-10-CM

## 2016-06-06 NOTE — Progress Notes (Signed)
Pre visit review using our clinic review tool, if applicable. No additional management support is needed unless otherwise documented below in the visit note. 

## 2016-06-06 NOTE — Assessment & Plan Note (Addendum)
Uncontrolled type 1 diabetes. Foot exam performed. I spent a lot of time with patient discussing how to dose/when to take Lantus, SSI insulin> I reemphasized to will take SSI with meals and snack. I reviewed Dr. Gabriel Carina prior note to ensure that all the education she gave,  I reiterated. I feel more comfortable if patient were to follow-up with endocrine with poorly controlled type 1 diabetes and complications ( renal, vision). She agreed and will call and make appointment.

## 2016-06-06 NOTE — Assessment & Plan Note (Signed)
At goal. Continue current regimen. 

## 2016-06-06 NOTE — Progress Notes (Signed)
Subjective:    Patient ID: Chelsea Barton, female    DOB: 09/23/1960, 55 y.o.   MRN: [id number]  CC: Chelsea Barton is a 55 y.o. female who presents today for follow up.   HPI:   HTN- compliant with medication. Denies exertional chest pain or pressure, numbness or tingling radiating to left arm or jaw, palpitations, dizziness, frequent headaches, changes in vision, or shortness of breath.    DM- Insulin regimen: Takes 45 units lantus qhs, approx 9am and eats some crackers. This morning fasting 121, and yesterday morning 92. Had a couple of hygplycemic episodes over the past couple of months, resolved with peanut butter, OJ. Taking novolog after a meal. Not taking novolog with snacks. Understands how to carb count. Has seen DM nutritionist in the past. Vision changes 'for years.' Novolog  6 breakfast 10  lunch 10 dinner Last a1c 8/9.  hasnt had f/u with Dr Gabriel Carina    Eye exam: 09/2015  HISTORY:  Past Medical History:  Diagnosis Date  . Diabetes mellitus   . Hyperlipidemia   . Hypertension    No past surgical history on file. Family History  Problem Relation Age of Onset  . Diabetes Sister     3/5 sisters have DM  . Breast cancer Neg Hx   . Colon cancer Neg Hx     Allergies: Patient has no known allergies. Current Outpatient Prescriptions on File Prior to Visit  Medication Sig Dispense Refill  . aspirin 81 MG tablet Take 81 mg by mouth daily.      . carvedilol (COREG) 12.5 MG tablet TAKE 1 TABLET BY MOUTH TWICE DAILY WITH A MEAL 180 tablet 0  . DIGOX 125 MCG tablet TK 1 T PO QD 90 tablet 0  . enalapril (VASOTEC) 10 MG tablet TAKE 1 TABLET(10 MG) BY MOUTH DAILY 90 tablet 0  . insulin aspart (NOVOLOG) 100 UNIT/ML injection Inject 6 units at breakfast, 10 units at lunch, and 10 units at supper 1 vial 11  . Insulin Glargine (LANTUS SOLOSTAR) 100 UNIT/ML Solostar Pen Inject 40 Units into the skin at bedtime. 10 pen 6  . Insulin Pen Needle (B-D ULTRAFINE III SHORT PEN) 31G X 8  MM MISC USE AS DIRECTED 100 each 6  . Multiple Vitamin (MULTIVITAMIN) capsule Take 1 capsule by mouth daily.      . simvastatin (ZOCOR) 20 MG tablet Take 1 tablet (20 mg total) by mouth every evening. 90 tablet 1  . simvastatin (ZOCOR) 20 MG tablet TAKE 1 TABLET(20 MG) BY MOUTH EVERY EVENING 90 tablet 0  . vitamin D, CHOLECALCIFEROL, 400 UNITS tablet Take 400 Units by mouth 2 (two) times daily.       No current facility-administered medications on file prior to visit.     Social History  Substance Use Topics  . Smoking status: Former Smoker    Quit date: 06/18/2005  . Smokeless tobacco: Never Used  . Alcohol use No    Review of Systems  Constitutional: Negative for chills and fever.  Eyes: Positive for visual disturbance.  Respiratory: Negative for cough.   Cardiovascular: Negative for chest pain, palpitations and leg swelling.  Gastrointestinal: Negative for nausea and vomiting.  Endocrine: Negative for polyuria.  Neurological: Negative for numbness.      Objective:    BP 120/60   Pulse 70   Temp 98.1 F (36.7 C) (Oral)   Ht 5\' 5"  (1.651 m)   Wt 214 lb 9.6 oz (97.3 kg)   SpO2  96%   BMI 35.71 kg/m  BP Readings from Last 3 Encounters:  06/06/16 120/60  05/22/16 126/66  11/17/15 138/68   Wt Readings from Last 3 Encounters:  06/06/16 214 lb 9.6 oz (97.3 kg)  05/22/16 213 lb 6.4 oz (96.8 kg)  11/17/15 210 lb (95.3 kg)    Physical Exam  Constitutional: She appears well-developed and well-nourished.  Eyes: Conjunctivae are normal.  Cardiovascular: Normal rate, regular rhythm, normal heart sounds and normal pulses.   No LE edema  Pulmonary/Chest: Effort normal and breath sounds normal. She has no wheezes. She has no rhonchi. She has no rales.  Neurological: She is alert.  Skin: Skin is warm and dry.  Psychiatric: She has a normal mood and affect. Her speech is normal and behavior is normal. Thought content normal.  Vitals reviewed.      Assessment & Plan:    Problem List Items Addressed This Visit      Cardiovascular and Mediastinum   Hypertension    At goal. Continue current regimen.        Endocrine   DM type 1 (diabetes mellitus, type 1) ([place])    Uncontrolled type 1 diabetes. Foot exam performed. I spent a lot of time with patient discussing how to dose/when to take Lantus, SSI insulin> I reemphasized to will take SSI with meals and snack. I reviewed Dr. Gabriel Carina prior note to ensure that all the education she gave,  I reiterated. I feel more comfortable if patient were to follow-up with endocrine with poorly controlled type 1 diabetes and complications ( renal, vision). She agreed and will call and make appointment.           I am having Chelsea Barton maintain her aspirin, multivitamin, vitamin D (CHOLECALCIFEROL), simvastatin, Insulin Glargine, carvedilol, DIGOX, enalapril, insulin aspart, Insulin Pen Needle, and simvastatin.   No orders of the defined types were placed in this encounter.   Return precautions given.   Risks, benefits, and alternatives of the medications and treatment plan prescribed today were discussed, and patient expressed understanding.   Education regarding symptom management and diagnosis given to patient on AVS.  Continue to follow with Mable Paris, FNP for routine health maintenance.   Chelsea Barton and I agreed with plan.   Mable Paris, FNP

## 2016-06-06 NOTE — Patient Instructions (Addendum)
A reminder from Dr Gabriel Carina-  Please call Dr Gabriel Carina when you get home today and make an appointment. Your diabetes is not well controlled and i'm very concerned with your low episodes and your type 1 DM.   Plan - -Check sugars at least FOUR times daily or qACHS due to hyperglycemia. -No changes recommended in insulin dosing at this time. -Counseled at length about how she should be taking her NovoLog. -Reminded her always to take NovoLog before meals. Reminded her that meals should contain carbohydrates. Reviewed what are carbohydrate. Reviewed how to estimate carb amount using her fist.  -Advised her to also take Novolog before snacks with carbs (such as 3 units per fistful of carbs). -Reminded her that Lantus is a bedtime insulin. -Continue with ACE-inhibitor.  -Encouraged low carb diet and regular exercise. -Follow-up in 3 months with labs prior.

## 2016-06-21 ENCOUNTER — Telehealth: Payer: Self-pay | Admitting: *Deleted

## 2016-06-21 NOTE — Telephone Encounter (Signed)
Mendel Ryder from [facility] has requested a verbal order to continue home care for medication box fill Contact [phone]

## 2016-06-21 NOTE — Telephone Encounter (Signed)
Please advise 

## 2016-06-23 NOTE — Telephone Encounter (Signed)
Please give verbal on my behalf, thanks [place]

## 2016-06-24 NOTE — Telephone Encounter (Signed)
Left message for Chelsea Barton to return call back.

## 2016-06-27 NOTE — Telephone Encounter (Signed)
Chelsea Barton has been informed and had no questions comments or concerns at this time.

## 2016-06-29 ENCOUNTER — Other Ambulatory Visit: Payer: Self-pay | Admitting: Family

## 2016-06-29 DIAGNOSIS — E1039 Type 1 diabetes mellitus with other diabetic ophthalmic complication: Secondary | ICD-10-CM

## 2016-06-29 DIAGNOSIS — Z0000 Encounter for general adult medical examination without abnormal findings: Secondary | ICD-10-CM

## 2016-07-10 NOTE — Telephone Encounter (Signed)
Pt lvm asking for a refill of her Lantus. States that it needs to be changed from 40 uses to 45. States that she is about out. Please send to [place]. [employer]. [place] cb [phone]

## 2016-07-10 NOTE — Telephone Encounter (Signed)
Please advise 

## 2016-07-11 MED ORDER — INSULIN GLARGINE 100 UNIT/ML SOLOSTAR PEN: 45.0000 [IU] | PEN_INJECTOR | Freq: Every day | SUBCUTANEOUS | 6 refills | Status: DC

## 2016-07-11 NOTE — Telephone Encounter (Signed)
Call pt -- Refills given  Would you ask if she has yet to call endocrine Dr Gabriel Carina for follow up appt for DM?  We discussed this a couple of times and I think she needs to see [facility] again

## 2016-07-31 ENCOUNTER — Telehealth: Payer: Self-pay | Admitting: Family

## 2016-07-31 NOTE — Telephone Encounter (Signed)
Yes okay 

## 2016-07-31 NOTE — Telephone Encounter (Signed)
[name] from [facility] is needing a verbal order for re certification to keep the patient's pill box filled every two weeks.

## 2016-07-31 NOTE — Telephone Encounter (Signed)
Ok to do so

## 2016-08-02 NOTE — Telephone Encounter (Signed)
Chelsea Barton has been notified.

## 2016-10-04 ENCOUNTER — Other Ambulatory Visit: Payer: Self-pay | Admitting: Family

## 2016-10-06 ENCOUNTER — Other Ambulatory Visit: Payer: Self-pay | Admitting: Family Medicine

## 2016-10-10 ENCOUNTER — Telehealth: Payer: Self-pay | Admitting: Family

## 2016-10-10 NOTE — Telephone Encounter (Signed)
Called patient and was unable to leave VM

## 2016-10-10 NOTE — Telephone Encounter (Signed)
Call pt  I refilled digoxin  I noticed she hasn't been back to cardiology since 12/2015 and would be most comfortable if she continued to have routine ( at least yearly) follow up with them.   She also needs close follow up with as she is diabetic and I have seen her since 05/2016  Please advise f/u with me and cardiology

## 2016-10-10 NOTE — Telephone Encounter (Signed)
Would you type up a letter  And mail?  Want to ensure she gets message

## 2016-10-10 NOTE — Telephone Encounter (Signed)
Letter has been mailed.

## 2016-10-15 ENCOUNTER — Telehealth: Payer: Self-pay | Admitting: Family

## 2016-10-15 NOTE — Telephone Encounter (Signed)
Ok to do so

## 2016-10-15 NOTE — Telephone Encounter (Signed)
Yes, okay.

## 2016-10-15 NOTE — Telephone Encounter (Signed)
Chelsea Barton [phone] called from [facility] regarding needing a recert order for pill box fill. It can be verbal a msg can left. Please leave your first and last name. Thank you!

## 2016-10-15 NOTE — Telephone Encounter (Signed)
Chelsea Barton was given verbal order.

## 2016-10-21 ENCOUNTER — Ambulatory Visit (INDEPENDENT_AMBULATORY_CARE_PROVIDER_SITE_OTHER): Payer: Medicaid Other | Admitting: Podiatry

## 2016-10-21 DIAGNOSIS — B351 Tinea unguium: Secondary | ICD-10-CM | POA: Diagnosis not present

## 2016-10-21 DIAGNOSIS — M79676 Pain in unspecified toe(s): Secondary | ICD-10-CM | POA: Diagnosis not present

## 2016-10-21 DIAGNOSIS — E1059 Type 1 diabetes mellitus with other circulatory complications: Secondary | ICD-10-CM

## 2016-10-21 NOTE — Progress Notes (Addendum)
Complaint:  Visit Type: Patient returns to my office for continued preventative foot care services. Complaint: Patient states" my nails have grown long and thick and become painful to walk and wear shoes" Patient has been diagnosed with DM with no foot complications. The patient presents for preventative foot care services. No changes to ROS  Podiatric Exam: Vascular: dorsalis pedis and posterior tibial pulses are barely  palpable bilateral. Capillary return is immediate. Temperature gradient is WNL. Skin turgor WNL  Sensorium: Diminished  Semmes Weinstein monofilament test. Normal tactile sensation bilaterally. Nail Exam: Pt has thick disfigured discolored nails with subungual debris noted bilateral entire nail hallux through fifth toenails Ulcer Exam: There is no evidence of ulcer or pre-ulcerative changes or infection. Orthopedic Exam: Muscle tone and strength are WNL. No limitations in general ROM. No crepitus or effusions noted. HAV  B/L.  Swelling right foot Skin: No Porokeratosis. No infection or ulcers  Diagnosis:  Onychomycosis, , Pain in right toe, pain in left toes  Treatment & Plan Procedures and Treatment: Consent by patient was obtained for treatment procedures. The patient understood the discussion of treatment and procedures well. All questions were answered thoroughly reviewed. Debridement of mycotic and hypertrophic toenails, 1 through 5 bilateral and clearing of subungual debris. No ulceration, no infection noted.  Return Visit-Office Procedure: Patient instructed to return to the office for a follow up visit 3 months for continued evaluation and treatment.    Gardiner Barefoot DPM

## 2016-11-04 ENCOUNTER — Other Ambulatory Visit: Payer: Self-pay | Admitting: Family

## 2016-11-04 ENCOUNTER — Ambulatory Visit: Payer: Medicaid Other | Admitting: Podiatry

## 2016-11-04 DIAGNOSIS — Z1231 Encounter for screening mammogram for malignant neoplasm of breast: Secondary | ICD-10-CM

## 2016-11-20 ENCOUNTER — Ambulatory Visit (INDEPENDENT_AMBULATORY_CARE_PROVIDER_SITE_OTHER): Payer: Medicaid Other | Admitting: Family

## 2016-11-20 ENCOUNTER — Encounter: Payer: Self-pay | Admitting: Family

## 2016-11-20 VITALS — BP 126/70 | HR 73 | Temp 97.5°F | Ht 65.0 in | Wt 202.0 lb

## 2016-11-20 DIAGNOSIS — H547 Unspecified visual loss: Secondary | ICD-10-CM | POA: Insufficient documentation

## 2016-11-20 DIAGNOSIS — I059 Rheumatic mitral valve disease, unspecified: Secondary | ICD-10-CM | POA: Insufficient documentation

## 2016-11-20 DIAGNOSIS — N189 Chronic kidney disease, unspecified: Secondary | ICD-10-CM

## 2016-11-20 DIAGNOSIS — E785 Hyperlipidemia, unspecified: Secondary | ICD-10-CM | POA: Diagnosis not present

## 2016-11-20 DIAGNOSIS — I2510 Atherosclerotic heart disease of native coronary artery without angina pectoris: Secondary | ICD-10-CM | POA: Diagnosis not present

## 2016-11-20 DIAGNOSIS — E1039 Type 1 diabetes mellitus with other diabetic ophthalmic complication: Secondary | ICD-10-CM

## 2016-11-20 DIAGNOSIS — I10 Essential (primary) hypertension: Secondary | ICD-10-CM

## 2016-11-20 LAB — COMPREHENSIVE METABOLIC PANEL
ALT: 11 U/L (ref 0–35)
AST: 15 U/L (ref 0–37)
Albumin: 3.7 g/dL (ref 3.5–5.2)
Alkaline Phosphatase: 55 U/L (ref 39–117)
BUN: 17 mg/dL (ref 6–23)
CALCIUM: 9.7 mg/dL (ref 8.4–10.5)
CO2: 30 mEq/L (ref 19–32)
Chloride: 106 mEq/L (ref 96–112)
Creatinine, Ser: 1.29 mg/dL — ABNORMAL HIGH (ref 0.40–1.20)
GFR: 54.73 mL/min — AB (ref >60.00)
Glucose, Bld: 177 mg/dL — ABNORMAL HIGH (ref 70–99)
Potassium: 5.1 mEq/L (ref 3.5–5.1)
Sodium: 139 mEq/L (ref 135–145)
TOTAL PROTEIN: 6.9 g/dL (ref 6.0–8.3)
Total Bilirubin: 0.5 mg/dL (ref 0.2–1.2)

## 2016-11-20 LAB — LIPID PANEL
CHOL/HDL RATIO: 3
Cholesterol: 138 mg/dL (ref 0–200)
HDL: 51.4 mg/dL (ref >39.00)
LDL Cholesterol: 67 mg/dL (ref 0–99)
NONHDL: 86.49
Triglycerides: 95 mg/dL (ref 0.0–149.0)
VLDL: 19 mg/dL (ref 0.0–40.0)

## 2016-11-20 LAB — HEMOGLOBIN A1C: Hgb A1c MFr Bld: 8.1 % — ABNORMAL HIGH (ref 4.6–6.5)

## 2016-11-20 MED ORDER — ENALAPRIL MALEATE 10 MG PO TABS: ORAL_TABLET | ORAL | 1 refills | Status: DC

## 2016-11-20 MED ORDER — ASPIRIN 81 MG PO TABS: 81.0000 mg | ORAL_TABLET | Freq: Every day | ORAL | 6 refills | Status: DC

## 2016-11-20 MED ORDER — INSULIN ASPART 100 UNIT/ML ~~LOC~~ SOLN: SUBCUTANEOUS | 11 refills | Status: DC

## 2016-11-20 MED ORDER — SIMVASTATIN 20 MG PO TABS: 20.0000 mg | ORAL_TABLET | Freq: Every day | ORAL | 1 refills | Status: DC

## 2016-11-20 MED ORDER — INSULIN GLARGINE 100 UNIT/ML SOLOSTAR PEN
45.0000 [IU] | PEN_INJECTOR | Freq: Every day | SUBCUTANEOUS | 6 refills | Status: DC
Start: 2016-11-20 — End: 2018-08-13

## 2016-11-20 MED ORDER — INSULIN PEN NEEDLE 31G X 8 MM MISC: 6 refills | Status: DC

## 2016-11-20 MED ORDER — DIGOX 125 MCG PO TABS: ORAL_TABLET | ORAL | 0 refills | Status: DC

## 2016-11-20 MED ORDER — CARVEDILOL 12.5 MG PO TABS: ORAL_TABLET | ORAL | 1 refills | Status: DC

## 2016-11-20 NOTE — Assessment & Plan Note (Addendum)
On asa, statin therapy. Clinically asymptomatic. Will continue current regimen. Pending lipid panel

## 2016-11-20 NOTE — Progress Notes (Signed)
Subjective:    Patient ID: Chelsea Barton, female    DOB: 1961-05-08, 56 y.o.   MRN: [id number]  CC: Chelsea Barton is a 56 y.o. female who presents today for follow up.   HPI: HTN- compliant with medication. Denies exertional chest pain or pressure, numbness or tingling radiating to left arm or jaw, palpitations, dizziness, frequent headaches, changes in vision, or shortness of breath.    DM- Has changed eating habits- stopped eating grease. More salads. Walking every day. NO CP with exertion, walking.   Fasting blood sugar 96. Lost 12 pounds. doesn't follow with dr Gabriel Carina.lantus 45 qhs, novolog with each meal ( 11-24-08).   Follows with eye doctor - unsure when next appointment is.      Declines colonoscopy.  Mammogram scheduled 12/2016.    06/2016 kowalski-  Mitral valve disease- mildly symptomatic- on ace and statin.   Cardiology Nehemiah Massed - 02/2015 - moderate mitral insufficieny and previous h/o cardiomyopathy, now improved CAD- asa and high intensity cholesterol 06/2015- cardiology Cardiomyopathy- on ACE and BB.   Stress test 2016.       HISTORY:  Past Medical History:  Diagnosis Date  . Diabetes mellitus   . Hyperlipidemia   . Hypertension    No past surgical history on file. Family History  Problem Relation Age of Onset  . Diabetes Sister        3/5 sisters have DM  . Breast cancer Neg Hx   . Colon cancer Neg Hx     Allergies: Patient has no known allergies. Current Outpatient Prescriptions on File Prior to Visit  Medication Sig Dispense Refill  . aspirin 81 MG tablet Take 81 mg by mouth daily.      . carvedilol (COREG) 12.5 MG tablet TAKE 1 TABLET BY MOUTH TWICE DAILY WITH A MEAL 180 tablet 0  . DIGOX 125 MCG tablet TK 1 T PO QD 90 tablet 0  . enalapril (VASOTEC) 10 MG tablet TAKE 1 TABLET(10 MG) BY MOUTH DAILY 90 tablet 0  . insulin aspart (NOVOLOG) 100 UNIT/ML injection Inject 6 units at breakfast, 10 units at lunch, and 10 units at supper 1 vial 11    . Insulin Glargine (LANTUS SOLOSTAR) 100 UNIT/ML Solostar Pen Inject 45 Units into the skin at bedtime. 10 pen 6  . Insulin Pen Needle (B-D ULTRAFINE III SHORT PEN) 31G X 8 MM MISC USE AS DIRECTED 100 each 6  . Multiple Vitamin (MULTIVITAMIN) capsule Take 1 capsule by mouth daily.      . simvastatin (ZOCOR) 20 MG tablet TAKE 1 TABLET(20 MG) BY MOUTH EVERY EVENING 90 tablet 0  . vitamin D, CHOLECALCIFEROL, 400 UNITS tablet Take 400 Units by mouth 2 (two) times daily.       No current facility-administered medications on file prior to visit.     Social History  Substance Use Topics  . Smoking status: Former Smoker    Quit date: 06/18/2005  . Smokeless tobacco: Never Used  . Alcohol use No    Review of Systems    Objective:    BP 126/70   Pulse 73   Temp 97.5 F (36.4 C) (Oral)   Ht 5\' 5"  (1.651 m)   Wt 202 lb (91.6 kg)   SpO2 93%   BMI 33.61 kg/m  BP Readings from Last 3 Encounters:  11/20/16 126/70  06/06/16 120/60  05/22/16 126/66   Wt Readings from Last 3 Encounters:  11/20/16 202 lb (91.6 kg)  06/06/16 214 lb 9.6  oz (97.3 kg)  05/22/16 213 lb 6.4 oz (96.8 kg)    Physical Exam     Assessment & Plan:   Problem List Items Addressed This Visit      Endocrine   DM type 1 (diabetes mellitus, type 1) (Chelsea Barton)       I am having Chelsea Barton maintain her aspirin, multivitamin, vitamin D (CHOLECALCIFEROL), carvedilol, DIGOX, enalapril, insulin aspart, Insulin Pen Needle, simvastatin, and Insulin Glargine.   No orders of the defined types were placed in this encounter.   Return precautions given.   Risks, benefits, and alternatives of the medications and treatment plan prescribed today were discussed, and patient expressed understanding.   Education regarding symptom management and diagnosis given to patient on AVS.  Continue to follow with Burnard Hawthorne, FNP for routine health maintenance.   Chelsea Barton and I agreed with plan.   Mable Paris,  FNP

## 2016-11-20 NOTE — Patient Instructions (Addendum)
Make eye appointment.   Labs today  Come back for fasting lipid panel. Hold lantus night before and novolog with breakfast that day since you will be fasting in the morning. You may take blood pressure medications and cholesterol medication that morning as usual.   Follow up 4 months.

## 2016-11-20 NOTE — Assessment & Plan Note (Signed)
Based on fasting blood sugars, well controlled. Advised appointment. Pending A1c. Discussed as long as A1c stays near goal, we will not consult endocrine. However she is type I diabetic, and if uncontrolled, explained to her I would like for her to go back to Dr. Gabriel Carina.

## 2016-11-20 NOTE — Progress Notes (Signed)
Pre visit review using our clinic review tool, if applicable. No additional management support is needed unless otherwise documented below in the visit note. 

## 2016-11-20 NOTE — Assessment & Plan Note (Signed)
At goal. Will continue current regimen.  

## 2016-11-20 NOTE — Assessment & Plan Note (Signed)
Stable. Pending lipid panel.

## 2016-11-25 ENCOUNTER — Other Ambulatory Visit: Payer: Self-pay | Admitting: Family

## 2016-11-25 DIAGNOSIS — E1039 Type 1 diabetes mellitus with other diabetic ophthalmic complication: Secondary | ICD-10-CM

## 2016-11-25 DIAGNOSIS — I10 Essential (primary) hypertension: Secondary | ICD-10-CM

## 2016-12-09 ENCOUNTER — Ambulatory Visit
Admission: RE | Admit: 2016-12-09 | Discharge: 2016-12-09 | Disposition: A | Discharge status: Home / Self Care | Payer: Medicaid Other | Source: Ambulatory Visit | Attending: Family | Admitting: Family

## 2016-12-09 ENCOUNTER — Other Ambulatory Visit: Payer: Self-pay | Admitting: Family

## 2016-12-09 ENCOUNTER — Encounter: Payer: Self-pay | Admitting: Family

## 2016-12-09 DIAGNOSIS — R928 Other abnormal and inconclusive findings on diagnostic imaging of breast: Secondary | ICD-10-CM | POA: Insufficient documentation

## 2016-12-09 DIAGNOSIS — N6489 Other specified disorders of breast: Secondary | ICD-10-CM

## 2016-12-09 DIAGNOSIS — Z1231 Encounter for screening mammogram for malignant neoplasm of breast: Secondary | ICD-10-CM | POA: Insufficient documentation

## 2016-12-17 ENCOUNTER — Ambulatory Visit
Admission: RE | Admit: 2016-12-17 | Discharge: 2016-12-17 | Disposition: A | Discharge status: Home / Self Care | Payer: Medicaid Other | Source: Ambulatory Visit | Attending: Family | Admitting: Family

## 2016-12-17 DIAGNOSIS — N6489 Other specified disorders of breast: Secondary | ICD-10-CM | POA: Diagnosis not present

## 2016-12-17 DIAGNOSIS — R928 Other abnormal and inconclusive findings on diagnostic imaging of breast: Secondary | ICD-10-CM

## 2016-12-23 DIAGNOSIS — I10 Essential (primary) hypertension: Secondary | ICD-10-CM | POA: Diagnosis not present

## 2016-12-27 ENCOUNTER — Telehealth: Payer: Self-pay | Admitting: Family

## 2016-12-27 ENCOUNTER — Other Ambulatory Visit: Payer: Self-pay | Admitting: Family

## 2016-12-27 NOTE — Telephone Encounter (Signed)
Pill box. Ok to do so? Please advise.

## 2016-12-27 NOTE — Telephone Encounter (Signed)
Yes okay 

## 2016-12-27 NOTE — Telephone Encounter (Signed)
[name] from [facility] called and left a voicemail stating that she needs to get recert order for a PO box fill. Please advise, thank you!  Call [facility] @ [phone]

## 2016-12-30 ENCOUNTER — Telehealth: Payer: Self-pay | Admitting: Family

## 2016-12-30 NOTE — Telephone Encounter (Signed)
Call pt  We have forms to complete for [facility].   Please confirm her meds and dosages and ensure our chart  Matches what they are doing at home  Please ensure we have same insulin aspart and lantus doses.    How often does she take blood sugars? She needs to be doing this 3-4 times per day since on insulin.

## 2016-12-31 ENCOUNTER — Telehealth: Payer: Self-pay | Admitting: *Deleted

## 2016-12-31 DIAGNOSIS — I2510 Atherosclerotic heart disease of native coronary artery without angina pectoris: Secondary | ICD-10-CM

## 2016-12-31 DIAGNOSIS — E785 Hyperlipidemia, unspecified: Secondary | ICD-10-CM

## 2016-12-31 DIAGNOSIS — I059 Rheumatic mitral valve disease, unspecified: Secondary | ICD-10-CM

## 2016-12-31 DIAGNOSIS — I10 Essential (primary) hypertension: Secondary | ICD-10-CM

## 2016-12-31 MED ORDER — DIGOX 125 MCG PO TABS: ORAL_TABLET | ORAL | 0 refills | Status: DC

## 2016-12-31 MED ORDER — SIMVASTATIN 20 MG PO TABS: 20.0000 mg | ORAL_TABLET | Freq: Every day | ORAL | 1 refills | Status: DC

## 2016-12-31 NOTE — Addendum Note (Signed)
Addended by: Bevelyn Ngo on: 12/31/2016 09:13 AM   Modules accepted: Orders

## 2016-12-31 NOTE — Telephone Encounter (Signed)
Medication Refill requested for : simvastatin and Digox Pharmacy: [facility]  Return Contact :

## 2016-12-31 NOTE — Telephone Encounter (Signed)
Called and gave verbal to [place] at [facility], thanks

## 2016-12-31 NOTE — Telephone Encounter (Signed)
SENT IN RX

## 2017-01-01 NOTE — Telephone Encounter (Signed)
Called and spoke to patient.  Changes have been made on paperwork and placed in provider box for signature.  Patient stated she taks BS 3-4 times daily if she remembers.

## 2017-01-23 ENCOUNTER — Ambulatory Visit (INDEPENDENT_AMBULATORY_CARE_PROVIDER_SITE_OTHER): Payer: Medicaid Other | Admitting: Podiatry

## 2017-01-23 ENCOUNTER — Ambulatory Visit: Payer: Medicaid Other | Admitting: Endocrinology

## 2017-01-23 ENCOUNTER — Encounter: Payer: Self-pay | Admitting: Podiatry

## 2017-01-23 ENCOUNTER — Telehealth: Payer: Self-pay

## 2017-01-23 DIAGNOSIS — M79676 Pain in unspecified toe(s): Secondary | ICD-10-CM

## 2017-01-23 DIAGNOSIS — B351 Tinea unguium: Secondary | ICD-10-CM

## 2017-01-23 DIAGNOSIS — E1059 Type 1 diabetes mellitus with other circulatory complications: Secondary | ICD-10-CM

## 2017-01-23 NOTE — Progress Notes (Signed)
Complaint:  Visit Type: Patient returns to my office for continued preventative foot care services. Complaint: Patient states" my nails have grown long and thick and become painful to walk and wear shoes" Patient has been diagnosed with DM with no foot complications. The patient presents for preventative foot care services. No changes to ROS  Podiatric Exam: Vascular: dorsalis pedis and posterior tibial pulses are barely  palpable bilateral. Capillary return is immediate. Temperature gradient is WNL. Skin turgor WNL  Sensorium: Diminished  Semmes Weinstein monofilament test. Normal tactile sensation bilaterally. Nail Exam: Pt has thick disfigured discolored nails with subungual debris noted bilateral entire nail hallux through fifth toenails Ulcer Exam: There is no evidence of ulcer or pre-ulcerative changes or infection. Orthopedic Exam: Muscle tone and strength are WNL. No limitations in general ROM. No crepitus or effusions noted. HAV  B/L.  Skin: No Porokeratosis. No infection or ulcers  Diagnosis:  Onychomycosis, , Pain in right toe, pain in left toes  Treatment & Plan Procedures and Treatment: Consent by patient was obtained for treatment procedures. The patient understood the discussion of treatment and procedures well. All questions were answered thoroughly reviewed. Debridement of mycotic and hypertrophic toenails, 1 through 5 bilateral and clearing of subungual debris. No ulceration, no infection noted.  Return Visit-Office Procedure: Patient instructed to return to the office for a follow up visit 3 months for continued evaluation and treatment.    Gardiner Barefoot DPM

## 2017-01-23 NOTE — Telephone Encounter (Signed)
Left message for [name] from [facility] to return call back. I need correct fax,  I have sent patients paperwork to them via fax,  numerous times.  They have kept sending paperwork to be filled out.

## 2017-02-10 LAB — HM DIABETES EYE EXAM

## 2017-02-11 ENCOUNTER — Telehealth: Payer: Self-pay | Admitting: Family

## 2017-02-11 NOTE — Telephone Encounter (Signed)
[name] from [facility] called and is looking for recert to continue pill box fills for patient. Please advise, thank you!  Call [facility] @ [phone]

## 2017-02-11 NOTE — Telephone Encounter (Signed)
Ok to do so

## 2017-02-11 NOTE — Telephone Encounter (Signed)
Can we give standing order for this??   We do not need to give them a verbal so often. Please ask when you speak to them

## 2017-02-12 ENCOUNTER — Other Ambulatory Visit: Payer: Self-pay

## 2017-02-12 DIAGNOSIS — I10 Essential (primary) hypertension: Secondary | ICD-10-CM

## 2017-02-12 MED ORDER — CARVEDILOL 12.5 MG PO TABS: ORAL_TABLET | ORAL | 1 refills | Status: DC

## 2017-02-12 MED ORDER — DIGOXIN 125 MCG PO TABS: 125.0000 ug | ORAL_TABLET | Freq: Every day | ORAL | 1 refills | Status: DC

## 2017-02-13 NOTE — Telephone Encounter (Signed)
Chelsea Barton has been informed.  Also we can not put in standing orders.

## 2017-03-31 ENCOUNTER — Telehealth: Payer: Self-pay | Admitting: Family

## 2017-03-31 NOTE — Telephone Encounter (Signed)
Call pt   Review meds  Particular enlapril ( does she take one tablet or two?)  Insulin doses

## 2017-04-01 NOTE — Telephone Encounter (Signed)
Sent the completed one that was done two months ago due to them not changing paperwork.

## 2017-04-22 ENCOUNTER — Other Ambulatory Visit: Payer: Self-pay | Admitting: Family

## 2017-04-22 DIAGNOSIS — I10 Essential (primary) hypertension: Secondary | ICD-10-CM

## 2017-04-22 DIAGNOSIS — I059 Rheumatic mitral valve disease, unspecified: Secondary | ICD-10-CM

## 2017-04-22 DIAGNOSIS — I2510 Atherosclerotic heart disease of native coronary artery without angina pectoris: Secondary | ICD-10-CM

## 2017-04-28 ENCOUNTER — Ambulatory Visit: Payer: Medicaid Other | Admitting: Podiatry

## 2017-07-04 ENCOUNTER — Telehealth: Payer: Self-pay | Admitting: Family

## 2017-07-04 ENCOUNTER — Telehealth: Payer: Self-pay

## 2017-07-04 NOTE — Telephone Encounter (Signed)
Copied from [place] [id number]. Topic: General - Other >> Jul 04, 2017 11:58 AM Lolita Rieger, RMA wrote: Reason for CRM: orders needed for every other week monitor blood sugars and fill box pills  Please call [name] form [facility] @[phone] 

## 2017-07-04 NOTE — Telephone Encounter (Signed)
Copied from [place] [id number]. Topic: General - Other >> Jul 04, 2017 11:58 AM Lolita Rieger, RMA wrote: Reason for CRM: orders needed for every other week monitor blood sugars and fill box pills  Please call [name] form [facility] @[phone] 

## 2017-07-04 NOTE — Telephone Encounter (Signed)
Verbal given to monitor CBG every other week and to fill pill box weekly. FYI

## 2017-07-28 ENCOUNTER — Encounter: Payer: Self-pay | Admitting: Podiatry

## 2017-07-28 ENCOUNTER — Ambulatory Visit: Payer: Medicaid Other | Admitting: Podiatry

## 2017-07-28 DIAGNOSIS — M79676 Pain in unspecified toe(s): Secondary | ICD-10-CM

## 2017-07-28 DIAGNOSIS — B351 Tinea unguium: Secondary | ICD-10-CM | POA: Diagnosis not present

## 2017-07-28 DIAGNOSIS — E1059 Type 1 diabetes mellitus with other circulatory complications: Secondary | ICD-10-CM

## 2017-07-28 NOTE — Progress Notes (Signed)
Complaint:  Visit Type: Patient returns to my office for continued preventative foot care services. Complaint: Patient states" my nails have grown long and thick and become painful to walk and wear shoes" Patient has been diagnosed with DM with no foot complications. The patient presents for preventative foot care services. No changes to ROS  Podiatric Exam: Vascular: dorsalis pedis and posterior tibial pulses are barely  palpable bilateral. Capillary return is immediate. Temperature gradient is WNL. Skin turgor WNL  Sensorium: Diminished  Semmes Weinstein monofilament test. Normal tactile sensation bilaterally. Nail Exam: Pt has thick disfigured discolored nails with subungual debris noted bilateral entire nail hallux through fifth toenails Ulcer Exam: There is no evidence of ulcer or pre-ulcerative changes or infection. Orthopedic Exam: Muscle tone and strength are WNL. No limitations in general ROM. No crepitus or effusions noted. HAV  B/L.  Skin: No Porokeratosis. No infection or ulcers.  Callus along the lateral heel right foot.  Asymptomatic.  Diagnosis:  Onychomycosis, , Pain in right toe, pain in left toes  Treatment & Plan Procedures and Treatment: Consent by patient was obtained for treatment procedures. The patient understood the discussion of treatment and procedures well. All questions were answered thoroughly reviewed. Debridement of mycotic and hypertrophic toenails, 1 through 5 bilateral and clearing of subungual debris. No ulceration, no infection noted.  Return Visit-Office Procedure: Patient instructed to return to the office for a follow up visit 4 months for continued evaluation and treatment.    Aldahir Litaker DPM 

## 2017-09-19 ENCOUNTER — Encounter: Payer: Self-pay | Admitting: Family

## 2017-09-19 ENCOUNTER — Other Ambulatory Visit: Payer: Self-pay | Admitting: Family

## 2017-09-19 ENCOUNTER — Other Ambulatory Visit (HOSPITAL_COMMUNITY)
Admission: RE | Admit: 2017-09-19 | Discharge: 2017-09-19 | Disposition: A | Discharge status: Home / Self Care | Payer: Medicaid Other | Source: Ambulatory Visit | Attending: Family | Admitting: Family

## 2017-09-19 ENCOUNTER — Ambulatory Visit (INDEPENDENT_AMBULATORY_CARE_PROVIDER_SITE_OTHER): Payer: Medicaid Other | Admitting: Family

## 2017-09-19 VITALS — BP 155/85 | HR 67 | Temp 98.4°F | Resp 15 | Ht 65.0 in | Wt 207.5 lb

## 2017-09-19 DIAGNOSIS — E1039 Type 1 diabetes mellitus with other diabetic ophthalmic complication: Secondary | ICD-10-CM | POA: Diagnosis not present

## 2017-09-19 DIAGNOSIS — I10 Essential (primary) hypertension: Secondary | ICD-10-CM

## 2017-09-19 DIAGNOSIS — Z0000 Encounter for general adult medical examination without abnormal findings: Secondary | ICD-10-CM | POA: Insufficient documentation

## 2017-09-19 DIAGNOSIS — Z0001 Encounter for general adult medical examination with abnormal findings: Secondary | ICD-10-CM

## 2017-09-19 DIAGNOSIS — Z122 Encounter for screening for malignant neoplasm of respiratory organs: Secondary | ICD-10-CM

## 2017-09-19 MED ORDER — ENALAPRIL MALEATE 10 MG PO TABS: ORAL_TABLET | ORAL | 1 refills | Status: DC

## 2017-09-19 NOTE — Assessment & Plan Note (Addendum)
Clinical breast exam and Pap smear performed today.  CT chest ordered for lung cancer screening based on history.  Patient politely declines any form of colon cancer screening.  Discussed at length cologuard and colonoscopy.  She declines at this time.  Screening labs, patient will come back at a later date when she is fasting.  She understands schedule a mammogram.

## 2017-09-19 NOTE — Patient Instructions (Addendum)
Hold insulin until you see Solum at 2:30 today.   Fasting lab appointment when you can  Monitor blood pressure; prescription for blood pressure cuff given. Lets do this for a couple of days, if no improvement, we will do a trial of enlapril '20mg'$  ( from '10mg'$ ) once per day and see if improves. Please be careful that you do not feel lightheaded as your blood pressures prior had been on low side.   Monitor blood pressure,  Goal is less than 130/80; if persistently higher, please make sooner follow up appointment so we can recheck you blood pressure and manage medications  We placed a referral for mammogram this year. I asked that you call one the below locations and schedule this when it is convenient for you.   As discussed, I would like you to ask for 3D mammogram over the traditional 2D mammogram as new evidence suggest 3D is superior.   Please note that NOT all insurance companies cover 3D and you may have to pay a higher copay. You may call your insurance company to further clarify your benefits.   Options for [place]  [place], [place]  * [facility] [place] * Note if you ask for 3D mammogram at this location, you must request [place], [place] location*     Health Maintenance, Female Adopting a healthy lifestyle and getting preventive care can go a long way to promote health and wellness. Talk with your health care provider about what schedule of regular examinations is right for you. This is a good chance for you to check in with your provider about disease prevention and staying healthy. In between checkups, there are plenty of things you can do on your own. Experts have done a lot of research about which lifestyle changes and preventive measures are most likely to keep you healthy. Ask your health care provider for more  information. Weight and diet Eat a healthy diet  Be sure to include plenty of vegetables, fruits, low-fat dairy products, and lean protein.  Do not eat a lot of foods high in solid fats, added sugars, or salt.  Get regular exercise. This is one of the most important things you can do for your health. ? Most adults should exercise for at least 150 minutes each week. The exercise should increase your heart rate and make you sweat (moderate-intensity exercise). ? Most adults should also do strengthening exercises at least twice a week. This is in addition to the moderate-intensity exercise.  Maintain a healthy weight  Body mass index (BMI) is a measurement that can be used to identify possible weight problems. It estimates body fat based on height and weight. Your health care provider can help determine your BMI and help you achieve or maintain a healthy weight.  For females 47 years of age and older: ? A BMI below 18.5 is considered underweight. ? A BMI of 18.5 to 24.9 is normal. ? A BMI of 25 to 29.9 is considered overweight. ? A BMI of 30 and above is considered obese.  Watch levels of cholesterol and blood lipids  You should start having your blood tested for lipids and cholesterol at 58 years of age, then have this test every 5 years.  You may need to have your cholesterol levels checked more often if: ? Your lipid or cholesterol levels are  high. ? You are older than 58 years of age. ? You are at high risk for heart disease.  Cancer screening Lung Cancer  Lung cancer screening is recommended for adults 68-45 years old who are at high risk for lung cancer because of a history of smoking.  A yearly low-dose CT scan of the lungs is recommended for people who: ? Currently smoke. ? Have quit within the past 15 years. ? Have at least a 30-pack-year history of smoking. A pack year is smoking an average of one pack of cigarettes a day for 1 year.  Yearly screening should continue  until it has been 15 years since you quit.  Yearly screening should stop if you develop a health problem that would prevent you from having lung cancer treatment.  Breast Cancer  Practice breast self-awareness. This means understanding how your breasts normally appear and feel.  It also means doing regular breast self-exams. Let your health care provider know about any changes, no matter how small.  If you are in your 20s or 30s, you should have a clinical breast exam (CBE) by a health care provider every 1-3 years as part of a regular health exam.  If you are 13 or older, have a CBE every year. Also consider having a breast X-ray (mammogram) every year.  If you have a family history of breast cancer, talk to your health care provider about genetic screening.  If you are at high risk for breast cancer, talk to your health care provider about having an MRI and a mammogram every year.  Breast cancer gene (BRCA) assessment is recommended for women who have family members with BRCA-related cancers. BRCA-related cancers include: ? Breast. ? Ovarian. ? Tubal. ? Peritoneal cancers.  Results of the assessment will determine the need for genetic counseling and BRCA1 and BRCA2 testing.  Cervical Cancer Your health care provider may recommend that you be screened regularly for cancer of the pelvic organs (ovaries, uterus, and vagina). This screening involves a pelvic examination, including checking for microscopic changes to the surface of your cervix (Pap test). You may be encouraged to have this screening done every 3 years, beginning at age 62.  For women ages 17-65, health care providers may recommend pelvic exams and Pap testing every 3 years, or they may recommend the Pap and pelvic exam, combined with testing for human papilloma virus (HPV), every 5 years. Some types of HPV increase your risk of cervical cancer. Testing for HPV may also be done on women of any age with unclear Pap test  results.  Other health care providers may not recommend any screening for nonpregnant women who are considered low risk for pelvic cancer and who do not have symptoms. Ask your health care provider if a screening pelvic exam is right for you.  If you have had past treatment for cervical cancer or a condition that could lead to cancer, you need Pap tests and screening for cancer for at least 20 years after your treatment. If Pap tests have been discontinued, your risk factors (such as having a new sexual partner) need to be reassessed to determine if screening should resume. Some women have medical problems that increase the chance of getting cervical cancer. In these cases, your health care provider may recommend more frequent screening and Pap tests.  Colorectal Cancer  This type of cancer can be detected and often prevented.  Routine colorectal cancer screening usually begins at 58 years of age and continues through 58  years of age.  Your health care provider may recommend screening at an earlier age if you have risk factors for colon cancer.  Your health care provider may also recommend using home test kits to check for hidden blood in the stool.  A small camera at the end of a tube can be used to examine your colon directly (sigmoidoscopy or colonoscopy). This is done to check for the earliest forms of colorectal cancer.  Routine screening usually begins at age 70.  Direct examination of the colon should be repeated every 5-10 years through 58 years of age. However, you may need to be screened more often if early forms of precancerous polyps or small growths are found.  Skin Cancer  Check your skin from head to toe regularly.  Tell your health care provider about any new moles or changes in moles, especially if there is a change in a mole's shape or color.  Also tell your health care provider if you have a mole that is larger than the size of a pencil eraser.  Always use sunscreen.  Apply sunscreen liberally and repeatedly throughout the day.  Protect yourself by wearing long sleeves, pants, a wide-brimmed hat, and sunglasses whenever you are outside.  Heart disease, diabetes, and high blood pressure  High blood pressure causes heart disease and increases the risk of stroke. High blood pressure is more likely to develop in: ? People who have blood pressure in the high end of the normal range (130-139/85-89 mm Hg). ? People who are overweight or obese. ? People who are African American.  If you are 35-65 years of age, have your blood pressure checked every 3-5 years. If you are 44 years of age or older, have your blood pressure checked every year. You should have your blood pressure measured twice-once when you are at a hospital or clinic, and once when you are not at a hospital or clinic. Record the average of the two measurements. To check your blood pressure when you are not at a hospital or clinic, you can use: ? An automated blood pressure machine at a pharmacy. ? A home blood pressure monitor.  If you are between 44 years and 25 years old, ask your health care provider if you should take aspirin to prevent strokes.  Have regular diabetes screenings. This involves taking a blood sample to check your fasting blood sugar level. ? If you are at a normal weight and have a low risk for diabetes, have this test once every three years after 58 years of age. ? If you are overweight and have a high risk for diabetes, consider being tested at a younger age or more often. Preventing infection Hepatitis B  If you have a higher risk for hepatitis B, you should be screened for this virus. You are considered at high risk for hepatitis B if: ? You were born in a country where hepatitis B is common. Ask your health care provider which countries are considered high risk. ? Your parents were born in a high-risk country, and you have not been immunized against hepatitis B (hepatitis B  vaccine). ? You have HIV or AIDS. ? You use needles to inject street drugs. ? You live with someone who has hepatitis B. ? You have had sex with someone who has hepatitis B. ? You get hemodialysis treatment. ? You take certain medicines for conditions, including cancer, organ transplantation, and autoimmune conditions.  Hepatitis C  Blood testing is recommended for: ? Everyone  born from [birth year] through 1966. ? Anyone with known risk factors for hepatitis C.  Sexually transmitted infections (STIs)  You should be screened for sexually transmitted infections (STIs) including gonorrhea and chlamydia if: ? You are sexually active and are younger than 58 years of age. ? You are older than 58 years of age and your health care provider tells you that you are at risk for this type of infection. ? Your sexual activity has changed since you were last screened and you are at an increased risk for chlamydia or gonorrhea. Ask your health care provider if you are at risk.  If you do not have HIV, but are at risk, it may be recommended that you take a prescription medicine daily to prevent HIV infection. This is called pre-exposure prophylaxis (PrEP). You are considered at risk if: ? You are sexually active and do not regularly use condoms or know the HIV status of your partner(s). ? You take drugs by injection. ? You are sexually active with a partner who has HIV.  Talk with your health care provider about whether you are at high risk of being infected with HIV. If you choose to begin PrEP, you should first be tested for HIV. You should then be tested every 3 months for as long as you are taking PrEP. Pregnancy  If you are premenopausal and you may become pregnant, ask your health care provider about preconception counseling.  If you may become pregnant, take 400 to 800 micrograms (mcg) of folic acid every day.  If you want to prevent pregnancy, talk to your health care provider about birth control  (contraception). Osteoporosis and menopause  Osteoporosis is a disease in which the bones lose minerals and strength with aging. This can result in serious bone fractures. Your risk for osteoporosis can be identified using a bone density scan.  If you are 5 years of age or older, or if you are at risk for osteoporosis and fractures, ask your health care provider if you should be screened.  Ask your health care provider whether you should take a calcium or vitamin D supplement to lower your risk for osteoporosis.  Menopause may have certain physical symptoms and risks.  Hormone replacement therapy may reduce some of these symptoms and risks. Talk to your health care provider about whether hormone replacement therapy is right for you. Follow these instructions at home:  Schedule regular health, dental, and eye exams.  Stay current with your immunizations.  Do not use any tobacco products including cigarettes, chewing tobacco, or electronic cigarettes.  If you are pregnant, do not drink alcohol.  If you are breastfeeding, limit how much and how often you drink alcohol.  Limit alcohol intake to no more than 1 drink per day for nonpregnant women. One drink equals 12 ounces of beer, 5 ounces of wine, or 1 ounces of hard liquor.  Do not use street drugs.  Do not share needles.  Ask your health care provider for help if you need support or information about quitting drugs.  Tell your health care provider if you often feel depressed.  Tell your health care provider if you have ever been abused or do not feel safe at home. This information is not intended to replace advice given to you by your health care provider. Make sure you discuss any questions you have with your health care provider. Document Released: 12/17/2010 Document Revised: 11/09/2015 Document Reviewed: 03/07/2015 Elsevier Interactive Patient Education  [employer].

## 2017-09-19 NOTE — Progress Notes (Signed)
Subjective:    Patient ID: Chelsea Barton, female    DOB: 07/19/1960, 57 y.o.   MRN: [id number]  CC: Chelsea Barton is a 57 y.o. female who presents today for physical exam.    HPI: DM I - a low in which had to call the paramedics. Has been eating more snacks throughout the day. 3-4 episodes early in the morning when woke up and it was low. Was eating peanut butter and drinking and blood sugar wouldn't come up.  Folows with Dr . This morning blood sugar 68 and then went up 144 , she took 4 units. Takes aspart 5 -10-10. Takes 45 units of lantus.  No highs per patient.   HTN- Ate bacon this morning since sugar low. Two big cups of coffee. Feels well. Compliant with medications  Denies exertional chest pain or pressure, numbness or tingling radiating to left arm or jaw, palpitations, dizziness, frequent headaches, changes in vision, or shortness of breath.   HLD- on zocor 20mg .    Cardiology- Follows with [facility]. NO  Changes at last visit 06/2017.   Colorectal Cancer Screening: Due; declines colonoscopy, cologuard.  Breast Cancer Screening: Mammogram due June 2019; left diagnostic normal 12/2016.  Cervical Cancer Screening: Due Bone Health screening/DEXA for 65+: No increased fracture risk. Defer screening at this time. Lung Cancer Screening: Quit 2007.       Tetanus - UTD        Pneumococcal - UTD Labs: Screening labs today. Exercise: Gets regular exercise.  Alcohol use: None Smoking/tobacco use: Former smoker.  Regular dental exams: NO; no teeth Wears seat belt: Yes. Skin: no skin changes   HISTORY:  Past Medical History:  Diagnosis Date  . Diabetes mellitus   . Hyperlipidemia   . Hypertension     History reviewed. No pertinent surgical history. Family History  Problem Relation Age of Onset  . Diabetes Sister        3/5 sisters have DM  . Breast cancer Neg Hx   . Colon cancer Neg Hx       ALLERGIES: Patient has no known allergies.  Current Outpatient  Medications on File Prior to Visit  Medication Sig Dispense Refill  . aspirin 81 MG tablet Take 1 tablet (81 mg total) by mouth daily. 30 tablet 6  . carvedilol (COREG) 12.5 MG tablet TAKE 1 TABLET BY MOUTH TWICE DAILY WITH A MEAL 180 tablet 1  . digoxin (DIGOX) 0.125 MG tablet TAKE 1 TABLET BY MOUTH EVERY DAY 90 tablet 0  . insulin aspart (NOVOLOG) 100 UNIT/ML injection Inject 6 units at breakfast, 10 units at lunch, and 10 units at supper 1 vial 11  . Insulin Glargine (LANTUS SOLOSTAR) 100 UNIT/ML Solostar Pen Inject 45 Units into the skin at bedtime. 10 pen 6  . Insulin Pen Needle (B-D ULTRAFINE III SHORT PEN) 31G X 8 MM MISC USE AS DIRECTED 100 each 6  . Insulin Pen Needle (B-D ULTRAFINE III SHORT PEN) 31G X 8 MM MISC USE AS DIRECTED 100 each 0  . Multiple Vitamin (MULTIVITAMIN) capsule Take 1 capsule by mouth daily.      . simvastatin (ZOCOR) 20 MG tablet Take 1 tablet (20 mg total) by mouth daily at 6 PM. 90 tablet 1  . vitamin D, CHOLECALCIFEROL, 400 UNITS tablet Take 400 Units by mouth 2 (two) times daily.       No current facility-administered medications on file prior to visit.     Social History   Tobacco  Use  . Smoking status: Former Smoker    Last attempt to quit: 06/18/2005    Years since quitting: 12.2  . Smokeless tobacco: Never Used  Substance Use Topics  . Alcohol use: No  . Drug use: No    Review of Systems  Constitutional: Negative for chills, fever and unexpected weight change.  HENT: Negative for congestion.   Respiratory: Negative for cough.   Cardiovascular: Negative for chest pain, palpitations and leg swelling.  Gastrointestinal: Negative for nausea and vomiting.  Genitourinary: Negative for pelvic pain and vaginal discharge.  Musculoskeletal: Negative for arthralgias and myalgias.  Skin: Negative for rash.  Neurological: Negative for headaches.  Hematological: Negative for adenopathy.  Psychiatric/Behavioral: Negative for confusion.      Objective:      BP (!) 155/85   Pulse 67   Temp 98.4 F (36.9 C) (Oral)   Resp 15   Ht 5\' 5"  (1.651 m)   Wt 207 lb 8 oz (94.1 kg)   SpO2 98%   BMI 34.53 kg/m   BP Readings from Last 3 Encounters:  09/19/17 (!) 155/85  11/20/16 126/70  06/06/16 120/60   Wt Readings from Last 3 Encounters:  09/19/17 207 lb 8 oz (94.1 kg)  11/20/16 202 lb (91.6 kg)  06/06/16 214 lb 9.6 oz (97.3 kg)    Physical Exam  Constitutional: She appears well-developed and well-nourished.  HENT:  Mouth/Throat: Uvula is midline, oropharynx is clear and moist and mucous membranes are normal.  Eyes: Pupils are equal, round, and reactive to light. Conjunctivae and EOM are normal.  Fundus normal bilaterally.   Neck: No thyroid mass and no thyromegaly present.  Cardiovascular: Normal rate, regular rhythm, normal heart sounds and normal pulses.  Pulmonary/Chest: Effort normal and breath sounds normal. She has no wheezes. She has no rhonchi. She has no rales. Right breast exhibits no inverted nipple, no mass, no nipple discharge, no skin change and no tenderness. Left breast exhibits no inverted nipple, no mass, no nipple discharge, no skin change and no tenderness. Breasts are symmetrical.  No masses or asymmetry appreciated during CBE.  Genitourinary: Uterus is not enlarged, not fixed and not tender. Cervix exhibits no motion tenderness, no discharge and no friability. Right adnexum displays no mass, no tenderness and no fullness. Left adnexum displays no mass, no tenderness and no fullness.  Genitourinary Comments: Pap performed. No CMT. Unable to appreciated ovaries.  Lymphadenopathy:       Head (right side): No submental, no submandibular, no tonsillar, no preauricular, no posterior auricular and no occipital adenopathy present.       Head (left side): No submental, no submandibular, no tonsillar, no preauricular, no posterior auricular and no occipital adenopathy present.       Right cervical: No superficial cervical, no  deep cervical and no posterior cervical adenopathy present.      Left cervical: No superficial cervical, no deep cervical and no posterior cervical adenopathy present.    She has no axillary adenopathy.       Right axillary: No pectoral and no lateral adenopathy present.       Left axillary: No pectoral and no lateral adenopathy present. Neurological: She is alert. She has normal strength. No cranial nerve deficit or sensory deficit. She displays a negative Romberg sign.  Reflex Scores:      Bicep reflexes are 2+ on the right side and 2+ on the left side.      Patellar reflexes are 2+ on the right side and  2+ on the left side. Grip equal and strong bilateral upper extremities. Gait strong and steady. Able to perform rapid alternating movement without difficulty.   Skin: Skin is warm and dry.  Psychiatric: She has a normal mood and affect. Her speech is normal and behavior is normal. Thought content normal.  Vitals reviewed.      Assessment & Plan:   Problem List Items Addressed This Visit      Cardiovascular and Mediastinum   Hypertension    Elevated today.  Typically runs on low side ( see prior BPs). Reassured patient feels well no signs or symptoms of hypertensive urgency or emergency at this time.  Blood pressure improved after patient rested in room.  Creatinine clearance 70 mL/min.  Will monitor for a couple of days and then if no improvement, Will increase enlapril to 20mg  and watch Crt.  She is also not checking her blood pressures at home.  Prescription for blood pressure cuff given to patient today.  She does follow with cardiology. Follow up in one month.       Relevant Medications   enalapril (VASOTEC) 10 MG tablet   Other Relevant Orders   DME Other see comment     Endocrine   DM type 1 (diabetes mellitus, type 1) ([place])    Very concerned regarding recent episodes of hypoglycemia.  We are able to get an appointment today with her endocrinologist, Dr. Gabriel Carina, at 245 this  afternoon.  I have advised her to hold all insulin until she is seen. Will follow.       Relevant Medications   enalapril (VASOTEC) 10 MG tablet     Other   Routine general medical examination at a health care facility - Primary    Clinical breast exam and Pap smear performed today.  CT chest ordered for lung cancer screening based on history.  Patient politely declines any form of colon cancer screening.  Discussed at length cologuard and colonoscopy.  She declines at this time.  Screening labs, patient will come back at a later date when she is fasting.  She understands schedule a mammogram.      Relevant Orders   Comprehensive metabolic panel   CBC with Differential/Platelet   Hemoglobin A1c   Lipid panel   TSH   VITAMIN D 25 Hydroxy (Vit-D Deficiency, Fractures)   Microalbumin / creatinine urine ratio   MM SCREENING BREAST TOMO BILATERAL   CT CHEST LUNG CANCER SCREENING LOW DOSE WO CONTRAST   Cytology - PAP    Other Visit Diagnoses    Encounter for screening for malignant neoplasm of respiratory organs       Relevant Orders   CT CHEST LUNG CANCER SCREENING LOW DOSE WO CONTRAST       I have discontinued Anmarie D. Drakeford's enalapril. I have also changed her enalapril. Additionally, I am having her maintain her multivitamin, vitamin D (CHOLECALCIFEROL), aspirin, insulin aspart, Insulin Glargine, Insulin Pen Needle, simvastatin, carvedilol, Insulin Pen Needle, and digoxin.   Meds ordered this encounter  Medications  . enalapril (VASOTEC) 10 MG tablet    Sig: TAKE 2 TABLET(20 MG) BY MOUTH DAILY    Dispense:  60 tablet    Refill:  1    Order Specific Question:   Supervising Provider    Answer:   Crecencio Mc [[id number]]    Return precautions given.   Risks, benefits, and alternatives of the medications and treatment plan prescribed today were discussed, and patient expressed understanding.  Education regarding symptom management and diagnosis given to patient on AVS.    Continue to follow with Burnard Hawthorne, FNP for routine health maintenance.   Kelsy D Allocca and I agreed with plan.   Mable Paris, FNP

## 2017-09-19 NOTE — Assessment & Plan Note (Signed)
Very concerned regarding recent episodes of hypoglycemia.  We are able to get an appointment today with her endocrinologist, Dr. Gabriel Carina, at 245 this afternoon.  I have advised her to hold all insulin until she is seen. Will follow.

## 2017-09-19 NOTE — Assessment & Plan Note (Addendum)
Elevated today.  Typically runs on low side ( see prior BPs). Reassured patient feels well no signs or symptoms of hypertensive urgency or emergency at this time.  Blood pressure improved after patient rested in room.  Creatinine clearance 70 mL/min.  Will monitor for a couple of days and then if no improvement, Will increase enlapril to 20mg  and watch Crt.  She is also not checking her blood pressures at home.  Prescription for blood pressure cuff given to patient today.  She does follow with cardiology. Follow up in one month.

## 2017-09-22 ENCOUNTER — Other Ambulatory Visit (INDEPENDENT_AMBULATORY_CARE_PROVIDER_SITE_OTHER): Payer: Medicaid Other

## 2017-09-22 ENCOUNTER — Telehealth: Payer: Self-pay | Admitting: *Deleted

## 2017-09-22 DIAGNOSIS — Z0000 Encounter for general adult medical examination without abnormal findings: Secondary | ICD-10-CM

## 2017-09-22 DIAGNOSIS — Z87891 Personal history of nicotine dependence: Secondary | ICD-10-CM

## 2017-09-22 DIAGNOSIS — Z122 Encounter for screening for malignant neoplasm of respiratory organs: Secondary | ICD-10-CM

## 2017-09-22 LAB — CBC WITH DIFFERENTIAL/PLATELET
BASOS ABS: 0.1 10*3/uL (ref 0.0–0.1)
BASOS PCT: 0.8 % (ref 0.0–3.0)
Eosinophils Absolute: 0.5 10*3/uL (ref 0.0–0.7)
Eosinophils Relative: 6.6 % — ABNORMAL HIGH (ref 0.0–5.0)
HCT: 37.6 % (ref 36.0–46.0)
Hemoglobin: 12.1 g/dL (ref 12.0–15.0)
LYMPHS ABS: 2 10*3/uL (ref 0.7–4.0)
LYMPHS PCT: 26.4 % (ref 12.0–46.0)
MCHC: 32.1 g/dL (ref 30.0–36.0)
MCV: 84.3 fl (ref 78.0–100.0)
MONO ABS: 0.7 10*3/uL (ref 0.1–1.0)
Monocytes Relative: 9.7 % (ref 3.0–12.0)
NEUTROS ABS: 4.4 10*3/uL (ref 1.4–7.7)
NEUTROS PCT: 56.5 % (ref 43.0–77.0)
Platelets: 216 10*3/uL (ref 150.0–400.0)
RBC: 4.47 Mil/uL (ref 3.87–5.11)
RDW: 14.1 % (ref 11.5–15.5)
WBC: 7.7 10*3/uL (ref 4.0–10.5)

## 2017-09-22 LAB — COMPREHENSIVE METABOLIC PANEL
ALT: 14 U/L (ref 0–35)
AST: 16 U/L (ref 0–37)
Albumin: 3.5 g/dL (ref 3.5–5.2)
Alkaline Phosphatase: 55 U/L (ref 39–117)
BILIRUBIN TOTAL: 0.3 mg/dL (ref 0.2–1.2)
BUN: 25 mg/dL — ABNORMAL HIGH (ref 6–23)
CALCIUM: 9.1 mg/dL (ref 8.4–10.5)
CHLORIDE: 106 meq/L (ref 96–112)
CO2: 31 mEq/L (ref 19–32)
CREATININE: 1.4 mg/dL — AB (ref 0.40–1.20)
GFR: 49.66 mL/min — ABNORMAL LOW (ref >60.00)
GLUCOSE: 90 mg/dL (ref 70–99)
Potassium: 4.4 mEq/L (ref 3.5–5.1)
SODIUM: 143 meq/L (ref 135–145)
Total Protein: 6.7 g/dL (ref 6.0–8.3)

## 2017-09-22 LAB — LIPID PANEL
Cholesterol: 135 mg/dL (ref 0–200)
HDL: 52.5 mg/dL (ref >39.00)
LDL CALC: 70 mg/dL (ref 0–99)
NONHDL: 82.97
TRIGLYCERIDES: 67 mg/dL (ref 0.0–149.0)
Total CHOL/HDL Ratio: 3
VLDL: 13.4 mg/dL (ref 0.0–40.0)

## 2017-09-22 LAB — MICROALBUMIN / CREATININE URINE RATIO
Creatinine,U: 109.7 mg/dL
MICROALB UR: 82 mg/dL — AB (ref 0.0–1.9)
Microalb Creat Ratio: 74.8 mg/g — ABNORMAL HIGH (ref 0.0–30.0)

## 2017-09-22 LAB — TSH: TSH: 1.2 u[IU]/mL (ref 0.35–4.50)

## 2017-09-22 LAB — HEMOGLOBIN A1C: Hgb A1c MFr Bld: 7.3 % — ABNORMAL HIGH (ref 4.6–6.5)

## 2017-09-22 LAB — VITAMIN D 25 HYDROXY (VIT D DEFICIENCY, FRACTURES): VITD: 21.78 ng/mL — ABNORMAL LOW (ref 30.00–100.00)

## 2017-09-22 NOTE — Telephone Encounter (Signed)
Received referral for initial lung cancer screening scan. Contacted patient and obtained smoking history,(former, quit 06/18/05, 36 pack year) as well as answering questions related to screening process. Patient denies signs of lung cancer such as weight loss or hemoptysis. Patient denies comorbidity that would prevent curative treatment if lung cancer were found. Patient is scheduled for shared decision making visit and CT scan on 10/09/17.

## 2017-09-23 ENCOUNTER — Other Ambulatory Visit: Payer: Self-pay | Admitting: Family

## 2017-09-23 DIAGNOSIS — N189 Chronic kidney disease, unspecified: Secondary | ICD-10-CM

## 2017-09-23 LAB — CYTOLOGY - PAP
DIAGNOSIS: NEGATIVE
HPV: NOT DETECTED

## 2017-10-06 ENCOUNTER — Telehealth: Payer: Self-pay | Admitting: Family

## 2017-10-06 DIAGNOSIS — E109 Type 1 diabetes mellitus without complications: Secondary | ICD-10-CM

## 2017-10-06 MED ORDER — BLOOD GLUCOSE MONITOR KIT: PACK | 0 refills | Status: DC

## 2017-10-06 NOTE — Telephone Encounter (Signed)
Copied from [facility] [id number]. Topic: Quick Communication - See Telephone Encounter >> Oct 06, 2017 10:32 AM Cleaster Corin, NT wrote: CRM for notification. See Telephone encounter for: 10/06/17.  Pt. Calling stating that she needs new meter and strips pt. Also stating that she gave the paper to the nurse with the type of meter and strips that her insurance will now cover. Pt. Is running low on supplies that she has now and will need new rx[facility] to [facility]. Pt. Stated that ACCU check will no longer be covered by insurance pt. Cb# [phone]   [id number] [address] [place] [address] [place] [address] Phone: [phone] Fax: [phone]

## 2017-10-06 NOTE — Telephone Encounter (Signed)
Script printed for your signature, patient advised

## 2017-10-06 NOTE — Telephone Encounter (Signed)
Please advise 

## 2017-10-06 NOTE — Telephone Encounter (Signed)
Call pt and see if willing to reconsider seeing nephrology- I would advise at least a consult. She declined.    does she have a f/u appt with me? Please ensure she does

## 2017-10-07 NOTE — Telephone Encounter (Signed)
Pt states the pharmacy never received the order for her blood glucose meter kit and supplies KIT Can you resend, but send [facility] a different [facility] than before  ( pharmacy info corrected)  [facility] [place], [place] [phone] (Phone) [phone] (Fax)

## 2017-10-07 NOTE — Telephone Encounter (Signed)
She has follow up appointment scheduled for 10/22/17, patient states she will see nephrology.

## 2017-10-07 NOTE — Telephone Encounter (Signed)
Script refaxed

## 2017-10-08 ENCOUNTER — Other Ambulatory Visit: Payer: Self-pay | Admitting: Family

## 2017-10-08 NOTE — Telephone Encounter (Signed)
So glad to hear she will see.   Chelsea Barton, can we get her back to nephrology? Do I need to put referral back in?

## 2017-10-08 NOTE — Telephone Encounter (Signed)
thanks

## 2017-10-08 NOTE — Telephone Encounter (Signed)
No, I'll open it back it and send it back to [facility].

## 2017-10-09 ENCOUNTER — Ambulatory Visit
Admission: RE | Admit: 2017-10-09 | Discharge: 2017-10-09 | Disposition: A | Discharge status: Home / Self Care | Payer: Medicaid Other | Source: Ambulatory Visit | Attending: Nurse Practitioner | Admitting: Nurse Practitioner

## 2017-10-09 ENCOUNTER — Inpatient Hospital Stay: Payer: Medicaid Other | Attending: Nurse Practitioner | Admitting: Nurse Practitioner

## 2017-10-09 DIAGNOSIS — I2510 Atherosclerotic heart disease of native coronary artery without angina pectoris: Secondary | ICD-10-CM | POA: Diagnosis not present

## 2017-10-09 DIAGNOSIS — I700 Atherosclerosis of aorta: Secondary | ICD-10-CM | POA: Insufficient documentation

## 2017-10-09 DIAGNOSIS — Z87891 Personal history of nicotine dependence: Secondary | ICD-10-CM | POA: Insufficient documentation

## 2017-10-09 DIAGNOSIS — Z122 Encounter for screening for malignant neoplasm of respiratory organs: Secondary | ICD-10-CM | POA: Insufficient documentation

## 2017-10-09 DIAGNOSIS — J439 Emphysema, unspecified: Secondary | ICD-10-CM | POA: Insufficient documentation

## 2017-10-09 NOTE — Progress Notes (Signed)
In accordance with CMS guidelines, patient has met eligibility criteria including age, absence of signs or symptoms of lung cancer.  Social History   Tobacco Use  . Smoking status: Former Smoker    Last attempt to quit: 06/18/2005    Years since quitting: 12.3  . Smokeless tobacco: Never Used  Substance Use Topics  . Alcohol use: No  . Drug use: No      A shared decision-making session was conducted prior to the performance of CT scan. This includes one or more decision aids, includes benefits and harms of screening, follow-up diagnostic testing, over-diagnosis, false positive rate, and total radiation exposure.   Counseling on the importance of adherence to annual lung cancer LDCT screening, impact of co-morbidities, and ability or willingness to undergo diagnosis and treatment is imperative for compliance of the program.   Counseling on the importance of continued smoking cessation for former smokers; the importance of smoking cessation for current smokers, and information about tobacco cessation interventions have been given to patient including [place] Quit [facility] and 1800 quit [place] programs.   Written order for lung cancer screening with LDCT has been given to the patient and any and all questions have been answered to the best of my abilities.    Yearly follow up will be coordinated by Shawn Perkins, Thoracic Navigator.  Lauren Allen, DNP, AGNP-C [facility] [phone] (work cell) [phone] (office) 10/09/17 2:47 PM   

## 2017-10-13 ENCOUNTER — Encounter: Payer: Self-pay | Admitting: *Deleted

## 2017-10-22 ENCOUNTER — Ambulatory Visit: Payer: Medicaid Other | Admitting: Family

## 2017-10-22 ENCOUNTER — Encounter: Payer: Self-pay | Admitting: Family

## 2017-10-22 VITALS — BP 140/84 | HR 68 | Temp 98.0°F | Resp 16 | Wt 207.2 lb

## 2017-10-22 DIAGNOSIS — I10 Essential (primary) hypertension: Secondary | ICD-10-CM

## 2017-10-22 DIAGNOSIS — N189 Chronic kidney disease, unspecified: Secondary | ICD-10-CM

## 2017-10-22 DIAGNOSIS — E1039 Type 1 diabetes mellitus with other diabetic ophthalmic complication: Secondary | ICD-10-CM | POA: Diagnosis not present

## 2017-10-22 MED ORDER — ENALAPRIL MALEATE 20 MG PO TABS: 40.0000 mg | ORAL_TABLET | Freq: Every day | ORAL | 1 refills | Status: DC

## 2017-10-22 NOTE — Patient Instructions (Addendum)
Referral to kidney doctor  ( nephrology); we will call you with an appointment. Please call me if you do not have an appointment with kidney doctor in the next week.   Based on your blood pressure today, I would like you to increase enalapril from 20 mg once a day to 40 mg once a day.  We will need to recheck your labs within approx 5 days of increasing this medication.  Please come on Monday morning.  Please also ensure that you do have a cardiology appointment scheduled next month as you stated you had. Important to follow up with them annually.   Managing Your Hypertension Hypertension is commonly called high blood pressure. This is when the force of your blood pressing against the walls of your arteries is too strong. Arteries are blood vessels that carry blood from your heart throughout your body. Hypertension forces the heart to work harder to pump blood, and may cause the arteries to become narrow or stiff. Having untreated or uncontrolled hypertension can cause heart attack, stroke, kidney disease, and other problems. What are blood pressure readings? A blood pressure reading consists of a higher number over a lower number. Ideally, your blood pressure should be below 120/80. The first ("top") number is called the systolic pressure. It is a measure of the pressure in your arteries as your heart beats. The second ("bottom") number is called the diastolic pressure. It is a measure of the pressure in your arteries as the heart relaxes. What does my blood pressure reading mean? Blood pressure is classified into four stages. Based on your blood pressure reading, your health care provider may use the following stages to determine what type of treatment you need, if any. Systolic pressure and diastolic pressure are measured in a unit called mm Hg. Normal  Systolic pressure: below 762.  Diastolic pressure: below 80. Elevated  Systolic pressure: 831-517.  Diastolic pressure: below  80. Hypertension stage 1  Systolic pressure: 616-073.  Diastolic pressure: 71-06. Hypertension stage 2  Systolic pressure: 269 or above.  Diastolic pressure: 90 or above. What health risks are associated with hypertension? Managing your hypertension is an important responsibility. Uncontrolled hypertension can lead to:  A heart attack.  A stroke.  A weakened blood vessel (aneurysm).  Heart failure.  Kidney damage.  Eye damage.  Metabolic syndrome.  Memory and concentration problems.  What changes can I make to manage my hypertension? Hypertension can be managed by making lifestyle changes and possibly by taking medicines. Your health care provider will help you make a plan to bring your blood pressure within a normal range. Eating and drinking  Eat a diet that is high in fiber and potassium, and low in salt (sodium), added sugar, and fat. An example eating plan is called the DASH (Dietary Approaches to Stop Hypertension) diet. To eat this way: ? Eat plenty of fresh fruits and vegetables. Try to fill half of your plate at each meal with fruits and vegetables. ? Eat whole grains, such as whole wheat pasta, brown rice, or whole grain bread. Fill about one quarter of your plate with whole grains. ? Eat low-fat diary products. ? Avoid fatty cuts of meat, processed or cured meats, and poultry with skin. Fill about one quarter of your plate with lean proteins such as fish, chicken without skin, beans, eggs, and tofu. ? Avoid premade and processed foods. These tend to be higher in sodium, added sugar, and fat.  Reduce your daily sodium intake. Most people with  hypertension should eat less than 1,500 mg of sodium a day.  Limit alcohol intake to no more than 1 drink a day for nonpregnant women and 2 drinks a day for men. One drink equals 12 oz of beer, 5 oz of wine, or 1 oz of hard liquor. Lifestyle  Work with your health care provider to maintain a healthy body weight, or to  lose weight. Ask what an ideal weight is for you.  Get at least 30 minutes of exercise that causes your heart to beat faster (aerobic exercise) most days of the week. Activities may include walking, swimming, or biking.  Include exercise to strengthen your muscles (resistance exercise), such as weight lifting, as part of your weekly exercise routine. Try to do these types of exercises for 30 minutes at least 3 days a week.  Do not use any products that contain nicotine or tobacco, such as cigarettes and e-cigarettes. If you need help quitting, ask your health care provider.  Control any long-term (chronic) conditions you have, such as high cholesterol or diabetes. Monitoring  Monitor your blood pressure at home as told by your health care provider. Your personal target blood pressure may vary depending on your medical conditions, your age, and other factors.  Have your blood pressure checked regularly, as often as told by your health care provider. Working with your health care provider  Review all the medicines you take with your health care provider because there may be side effects or interactions.  Talk with your health care provider about your diet, exercise habits, and other lifestyle factors that may be contributing to hypertension.  Visit your health care provider regularly. Your health care provider can help you create and adjust your plan for managing hypertension. Will I need medicine to control my blood pressure? Your health care provider may prescribe medicine if lifestyle changes are not enough to get your blood pressure under control, and if:  Your systolic blood pressure is 130 or higher.  Your diastolic blood pressure is 80 or higher.  Take medicines only as told by your health care provider. Follow the directions carefully. Blood pressure medicines must be taken as prescribed. The medicine does not work as well when you skip doses. Skipping doses also puts you at risk  for problems. Contact a health care provider if:  You think you are having a reaction to medicines you have taken.  You have repeated (recurrent) headaches.  You feel dizzy.  You have swelling in your ankles.  You have trouble with your vision. Get help right away if:  You develop a severe headache or confusion.  You have unusual weakness or numbness, or you feel faint.  You have severe pain in your chest or abdomen.  You vomit repeatedly.  You have trouble breathing. Summary  Hypertension is when the force of blood pumping through your arteries is too strong. If this condition is not controlled, it may put you at risk for serious complications.  Your personal target blood pressure may vary depending on your medical conditions, your age, and other factors. For most people, a normal blood pressure is less than 120/80.  Hypertension is managed by lifestyle changes, medicines, or both. Lifestyle changes include weight loss, eating a healthy, low-sodium diet, exercising more, and limiting alcohol. This information is not intended to replace advice given to you by your health care provider. Make sure you discuss any questions you have with your health care provider. Document Released: 02/26/2012 Document Revised: 05/01/2016 Document  Reviewed: 05/01/2016 Elsevier Interactive Patient Education  [employer].

## 2017-10-22 NOTE — Assessment & Plan Note (Signed)
Systolic mildly elevated.  Discussed the importance of hypertension control particularly in the context of her CKD.  Will increase Enalapril 20 mg daily to 40 mg daily.  Pending BMP .  She continues to follow with cardiology.

## 2017-10-22 NOTE — Assessment & Plan Note (Signed)
CrtCl 65 ml/min. Again, emphasized importance of seeing nephrology. Checking on status of referral. On ace-I. Discussed HTN and DM and importance in context of CKD.

## 2017-10-22 NOTE — Assessment & Plan Note (Signed)
Following with Dr Gabriel Carina

## 2017-10-22 NOTE — Progress Notes (Signed)
Yes, I resubmitted the referral to them per her request because she had changed her mind about the referral.

## 2017-10-22 NOTE — Progress Notes (Signed)
Subjective:    Patient ID: Chelsea Barton, female    DOB: 1960-06-21, 57 y.o.   MRN: [id number]  CC: Chelsea Barton is a 57 y.o. female who presents today for follow up.   HPI: HTN- at home 140/84. Compliant with medication. Walking for 30 minutes every day.  Denies exertional chest pain or pressure, numbness or tingling radiating to left arm or jaw, palpitations, dizziness, frequent headaches, changes in vision, or shortness of breath.  Used to add salt, rarely uses salt now.   DM- dr , FBG 83 today. Following better with DM diet, lower sugar.   Declined nephrology Last saw Solum 5/1/2019Horris Latino at [facility]iology 12/2016; advised DASH; consider repeat stress testing in future. Per patient, scheduled an appointment next month.  Echo 2014 EF 45%.  HISTORY:  Past Medical History:  Diagnosis Date  . Diabetes mellitus   . Hyperlipidemia   . Hypertension    No past surgical history on file. Family History  Problem Relation Age of Onset  . Diabetes Sister        3/5 sisters have DM  . Breast cancer Neg Hx   . Colon cancer Neg Hx     Allergies: Patient has no known allergies. Current Outpatient Medications on File Prior to Visit  Medication Sig Dispense Refill  . ACCU-CHEK AVIVA PLUS test strip USE UP TO FOUR TIMES DAILY 350 each 1  . ACCU-CHEK FASTCLIX LANCETS MISC USE UP TO FOUR TIMES DAILY 306 each 0  . aspirin 81 MG tablet Take 1 tablet (81 mg total) by mouth daily. 30 tablet 6  . blood glucose meter kit and supplies KIT Dispense based on patient and insurance preference. Use up to four times daily as directed. (FOR ICD-9 250.00, 250.01). 1 each 0  . carvedilol (COREG) 12.5 MG tablet TAKE 1 TABLET BY MOUTH TWICE DAILY WITH A MEAL 180 tablet 1  . digoxin (DIGOX) 0.125 MG tablet TAKE 1 TABLET BY MOUTH EVERY DAY 90 tablet 0  . insulin aspart (NOVOLOG) 100 UNIT/ML injection Inject 6 units at breakfast, 10 units at lunch, and 10 units at supper 1 vial 11  . Insulin Glargine (LANTUS  SOLOSTAR) 100 UNIT/ML Solostar Pen Inject 45 Units into the skin at bedtime. 10 pen 6  . Insulin Pen Needle (B-D ULTRAFINE III SHORT PEN) 31G X 8 MM MISC USE AS DIRECTED 100 each 6  . Insulin Pen Needle (B-D ULTRAFINE III SHORT PEN) 31G X 8 MM MISC USE AS DIRECTED 100 each 0  . Multiple Vitamin (MULTIVITAMIN) capsule Take 1 capsule by mouth daily.      . simvastatin (ZOCOR) 20 MG tablet Take 1 tablet (20 mg total) by mouth daily at 6 PM. 90 tablet 1  . vitamin D, CHOLECALCIFEROL, 400 UNITS tablet Take 400 Units by mouth 2 (two) times daily.       No current facility-administered medications on file prior to visit.     Social History   Tobacco Use  . Smoking status: Former Smoker    Last attempt to quit: 06/18/2005    Years since quitting: 12.3  . Smokeless tobacco: Never Used  Substance Use Topics  . Alcohol use: No  . Drug use: No    Review of Systems  Constitutional: Negative for chills and fever.  Respiratory: Negative for cough.   Cardiovascular: Negative for chest pain and palpitations.  Gastrointestinal: Negative for nausea and vomiting.      Objective:    BP 140/84 (BP  Location: Left Arm, Patient Position: Sitting, Cuff Size: Large)   Pulse 68   Temp 98 F (36.7 C) (Oral)   Resp 16   Wt 207 lb 4 oz (94 kg)   SpO2 95%   BMI 34.49 kg/m  BP Readings from Last 3 Encounters:  10/22/17 140/84  09/19/17 (!) 155/85  11/20/16 126/70   Wt Readings from Last 3 Encounters:  10/22/17 207 lb 4 oz (94 kg)  10/09/17 200 lb (90.7 kg)  09/19/17 207 lb 8 oz (94.1 kg)    Physical Exam  Constitutional: She appears well-developed and well-nourished.  Eyes: Conjunctivae are normal.  Cardiovascular: Normal rate, regular rhythm, normal heart sounds and normal pulses.  Pulmonary/Chest: Effort normal and breath sounds normal. She has no wheezes. She has no rhonchi. She has no rales.  Neurological: She is alert.  Skin: Skin is warm and dry.  Psychiatric: She has a normal mood and  affect. Her speech is normal and behavior is normal. Thought content normal.  Vitals reviewed.      Assessment & Plan:   Problem List Items Addressed This Visit      Cardiovascular and Mediastinum   Hypertension - Primary    Systolic mildly elevated.  Discussed the importance of hypertension control particularly in the context of her CKD.  Will increase Enalapril 20 mg daily to 40 mg daily.  Pending BMP .  She continues to follow with cardiology.      Relevant Medications   enalapril (VASOTEC) 20 MG tablet   Other Relevant Orders   Basic metabolic panel     Endocrine   DM type 1 (diabetes mellitus, type 1) ([place])    Following with Dr Gabriel Carina      Relevant Medications   enalapril (VASOTEC) 20 MG tablet     Genitourinary   CKD (chronic kidney disease)    CrtCl 65 ml/min. Again, emphasized importance of seeing nephrology. Checking on status of referral. On ace-I. Discussed HTN and DM and importance in context of CKD.           I have changed Chelsea Barton's enalapril. I am also having her maintain her multivitamin, vitamin D (CHOLECALCIFEROL), aspirin, insulin aspart, Insulin Glargine, Insulin Pen Needle, simvastatin, carvedilol, Insulin Pen Needle, digoxin, blood glucose meter kit and supplies, ACCU-CHEK FASTCLIX LANCETS, and ACCU-CHEK AVIVA PLUS.   Meds ordered this encounter  Medications  . enalapril (VASOTEC) 20 MG tablet    Sig: Take 2 tablets (40 mg total) by mouth daily. TAKE 2 TABLETS(20 MG) BY MOUTH DAILY    Dispense:  90 tablet    Refill:  1    **Patient requests 90 days supply**    Order Specific Question:   Supervising Provider    Answer:   Crecencio Mc [[id number]]    Return precautions given.   Risks, benefits, and alternatives of the medications and treatment plan prescribed today were discussed, and patient expressed understanding.   Education regarding symptom management and diagnosis given to patient on AVS.  Continue to follow with Burnard Hawthorne, FNP for routine health maintenance.   Renetta D Holley and I agreed with plan.   Mable Paris, FNP

## 2017-10-29 ENCOUNTER — Other Ambulatory Visit (INDEPENDENT_AMBULATORY_CARE_PROVIDER_SITE_OTHER): Payer: Medicaid Other

## 2017-10-29 DIAGNOSIS — I10 Essential (primary) hypertension: Secondary | ICD-10-CM

## 2017-10-29 LAB — BASIC METABOLIC PANEL
BUN: 22 mg/dL (ref 6–23)
CALCIUM: 9.2 mg/dL (ref 8.4–10.5)
CO2: 27 meq/L (ref 19–32)
CREATININE: 1.49 mg/dL — AB (ref 0.40–1.20)
Chloride: 104 mEq/L (ref 96–112)
GFR: 46.19 mL/min — ABNORMAL LOW (ref >60.00)
GLUCOSE: 133 mg/dL — AB (ref 70–99)
Potassium: 4.8 mEq/L (ref 3.5–5.1)
Sodium: 138 mEq/L (ref 135–145)

## 2017-11-13 DIAGNOSIS — E1122 Type 2 diabetes mellitus with diabetic chronic kidney disease: Secondary | ICD-10-CM | POA: Diagnosis not present

## 2017-11-13 DIAGNOSIS — R319 Hematuria, unspecified: Secondary | ICD-10-CM | POA: Diagnosis not present

## 2017-11-13 DIAGNOSIS — N183 Chronic kidney disease, stage 3 (moderate): Secondary | ICD-10-CM | POA: Diagnosis not present

## 2017-11-13 DIAGNOSIS — I129 Hypertensive chronic kidney disease with stage 1 through stage 4 chronic kidney disease, or unspecified chronic kidney disease: Secondary | ICD-10-CM | POA: Diagnosis not present

## 2017-11-13 DIAGNOSIS — R809 Proteinuria, unspecified: Secondary | ICD-10-CM | POA: Diagnosis not present

## 2017-11-14 ENCOUNTER — Emergency Department
Admission: EM | Admit: 2017-11-14 | Discharge: 2017-11-14 | Disposition: A | Discharge status: Home / Self Care | Payer: Medicaid Other | Attending: Emergency Medicine | Admitting: Emergency Medicine

## 2017-11-14 ENCOUNTER — Emergency Department: Payer: Medicaid Other

## 2017-11-14 DIAGNOSIS — Z794 Long term (current) use of insulin: Secondary | ICD-10-CM | POA: Diagnosis not present

## 2017-11-14 DIAGNOSIS — I2510 Atherosclerotic heart disease of native coronary artery without angina pectoris: Secondary | ICD-10-CM | POA: Insufficient documentation

## 2017-11-14 DIAGNOSIS — E1022 Type 1 diabetes mellitus with diabetic chronic kidney disease: Secondary | ICD-10-CM | POA: Diagnosis not present

## 2017-11-14 DIAGNOSIS — E162 Hypoglycemia, unspecified: Secondary | ICD-10-CM

## 2017-11-14 DIAGNOSIS — I129 Hypertensive chronic kidney disease with stage 1 through stage 4 chronic kidney disease, or unspecified chronic kidney disease: Secondary | ICD-10-CM | POA: Diagnosis not present

## 2017-11-14 DIAGNOSIS — Z79899 Other long term (current) drug therapy: Secondary | ICD-10-CM | POA: Diagnosis not present

## 2017-11-14 DIAGNOSIS — E11649 Type 2 diabetes mellitus with hypoglycemia without coma: Secondary | ICD-10-CM | POA: Diagnosis not present

## 2017-11-14 DIAGNOSIS — N189 Chronic kidney disease, unspecified: Secondary | ICD-10-CM | POA: Insufficient documentation

## 2017-11-14 DIAGNOSIS — R4182 Altered mental status, unspecified: Secondary | ICD-10-CM | POA: Diagnosis not present

## 2017-11-14 DIAGNOSIS — Z87891 Personal history of nicotine dependence: Secondary | ICD-10-CM | POA: Diagnosis not present

## 2017-11-14 LAB — COMPREHENSIVE METABOLIC PANEL
ALT: 13 U/L — ABNORMAL LOW (ref 14–54)
ANION GAP: 8 (ref 5–15)
AST: 20 U/L (ref 15–41)
Albumin: 3.2 g/dL — ABNORMAL LOW (ref 3.5–5.0)
Alkaline Phosphatase: 54 U/L (ref 38–126)
BILIRUBIN TOTAL: 0.6 mg/dL (ref 0.3–1.2)
BUN: 19 mg/dL (ref 6–20)
CHLORIDE: 110 mmol/L (ref 101–111)
CO2: 22 mmol/L (ref 22–32)
Calcium: 8.4 mg/dL — ABNORMAL LOW (ref 8.9–10.3)
Creatinine, Ser: 1.27 mg/dL — ABNORMAL HIGH (ref 0.44–1.00)
GFR, EST AFRICAN AMERICAN: 53 mL/min — AB (ref >60)
GFR, EST NON AFRICAN AMERICAN: 46 mL/min — AB (ref >60)
Glucose, Bld: 105 mg/dL — ABNORMAL HIGH (ref 65–99)
POTASSIUM: 3.7 mmol/L (ref 3.5–5.1)
Sodium: 140 mmol/L (ref 135–145)
Total Protein: 6.4 g/dL — ABNORMAL LOW (ref 6.5–8.1)

## 2017-11-14 LAB — CBC
HEMATOCRIT: 36.3 % (ref 35.0–47.0)
Hemoglobin: 11.9 g/dL — ABNORMAL LOW (ref 12.0–16.0)
MCH: 27.4 pg (ref 26.0–34.0)
MCHC: 32.8 g/dL (ref 32.0–36.0)
MCV: 83.6 fL (ref 80.0–100.0)
PLATELETS: 173 10*3/uL (ref 150–440)
RBC: 4.35 MIL/uL (ref 3.80–5.20)
RDW: 13.6 % (ref 11.5–14.5)
WBC: 7.9 10*3/uL (ref 3.6–11.0)

## 2017-11-14 LAB — GLUCOSE, CAPILLARY
GLUCOSE-CAPILLARY: 185 mg/dL — AB (ref 65–99)
Glucose-Capillary: 42 mg/dL — CL (ref 65–99)
Glucose-Capillary: 158 mg/dL — ABNORMAL HIGH (ref 65–99)

## 2017-11-14 LAB — TROPONIN I

## 2017-11-14 MED ORDER — DEXTROSE 50 % IV SOLN
1.0000 | Freq: Once | INTRAVENOUS | Status: AC
  Administered 2017-11-14: 50 mL via INTRAVENOUS

## 2017-11-14 MED ORDER — CARVEDILOL 6.25 MG PO TABS
12.5000 mg | ORAL_TABLET | Freq: Two times a day (BID) | ORAL | Status: DC
  Administered 2017-11-14: 12.5 mg via ORAL
  Filled 2017-11-14: qty 2

## 2017-11-14 NOTE — ED Triage Notes (Signed)
Pt brought in by neighbor and unresponsive. Pt taken out of vehicle by RN and tech. Upon entry to room, pt responsive only to self. Reports she is DM and took her normal insulin dose today.

## 2017-11-14 NOTE — ED Provider Notes (Signed)
Patient received in sign-out from Dr. Corky Downs.  Workup and evaluation pending observation and repeat cbg.  Is requesting discharge home and states that she feels fine.  Repeat CBG shows blood sugar 158.  Patient tolerating oral hydration.  At this point patient stable and appropriate for outpatient follow-up.Chelsea Lot, MD 11/14/17 [phone]

## 2017-11-14 NOTE — ED Notes (Signed)
Pt given [place] sandwich, crackers and ginger ale. Will continue to monitor.

## 2017-11-14 NOTE — ED Notes (Signed)
Pt is CAOx4 at this time. Pt sister is at bedside. Pt reports she was in the car and felt her blood sugar getting low and tried to eat an ice cream. Pt reports she has had her BSL drop like this a couple times before and states "I don't know what's been going on with my sugars. I've been eating and taking my insulin like I should." Pt reports insulin dose was adjusted by her PCP about 5 months ago. Pt denies any pain currently.

## 2017-11-14 NOTE — ED Notes (Signed)
Pt reports she did not take her BP meds today and is going to take once she is home, MD Alford Highland aware.

## 2017-11-14 NOTE — ED Notes (Signed)
Pt requesting to go home at this time, she reports she is feeling back to normal. Notified MD Alford Highland.

## 2017-11-14 NOTE — ED Provider Notes (Signed)
[facility] Emergency Department Provider Note   ____________________________________________    I have reviewed the triage vital signs and the nursing notes.   HISTORY  Chief Complaint Hypoglycemia     HPI Chelsea Barton is a 57 y.o. female who presents with altered mental status.  Patient brought to the emergency department by neighbor, nearly unresponsive had to be taken from car in a stretcher and brought immediately to the room.  Diaphoretic and confused.  Has a history of diabetes.  Apparently insulin only.  Glucose checked found to be 42.  Given 1 amp of D50 with good response  Patient is able to report that she has had frequent episodes of low blood sugar over the last year but no recent insulin adjustments  Past Medical History:  Diagnosis Date  . Diabetes mellitus   . Hyperlipidemia   . Hypertension     Patient Active Problem List   Diagnosis Date Noted  . CKD (chronic kidney disease) 11/20/2016  . CAD (coronary artery disease) 11/20/2016  . Mitral valve disease 11/20/2016  . Vision impairment 11/20/2016  . Routine general medical examination at a health care facility 12/14/2012  . Obesity 12/14/2012  . Hypertension 04/01/2011  . Hyperlipidemia 04/01/2011  . DM type 1 (diabetes mellitus, type 1) ([place]) 04/01/2011    No past surgical history on file.  Prior to Admission medications   Medication Sig Start Date End Date Taking? Authorizing Provider  ACCU-CHEK AVIVA PLUS test strip USE UP TO FOUR TIMES DAILY 10/08/17   Burnard Hawthorne, FNP  ACCU-CHEK FASTCLIX LANCETS MISC USE UP TO FOUR TIMES DAILY 10/08/17   Burnard Hawthorne, FNP  aspirin 81 MG tablet Take 1 tablet (81 mg total) by mouth daily. 11/20/16   Burnard Hawthorne, FNP  blood glucose meter kit and supplies KIT Dispense based on patient and insurance preference. Use up to four times daily as directed. (FOR ICD-9 250.00, 250.01). 10/06/17   Burnard Hawthorne, FNP    carvedilol (COREG) 12.5 MG tablet TAKE 1 TABLET BY MOUTH TWICE DAILY WITH A MEAL 02/12/17   Burnard Hawthorne, FNP  digoxin (DIGOX) 0.125 MG tablet TAKE 1 TABLET BY MOUTH EVERY DAY 04/24/17   Burnard Hawthorne, FNP  enalapril (VASOTEC) 20 MG tablet Take 2 tablets (40 mg total) by mouth daily. TAKE 2 TABLETS(20 MG) BY MOUTH DAILY 10/22/17   Burnard Hawthorne, FNP  insulin aspart (NOVOLOG) 100 UNIT/ML injection Inject 6 units at breakfast, 10 units at lunch, and 10 units at supper 11/20/16   Burnard Hawthorne, FNP  Insulin Glargine (LANTUS SOLOSTAR) 100 UNIT/ML Solostar Pen Inject 45 Units into the skin at bedtime. 11/20/16   Burnard Hawthorne, FNP  Insulin Pen Needle (B-D ULTRAFINE III SHORT PEN) 31G X 8 MM MISC USE AS DIRECTED 11/20/16   Burnard Hawthorne, FNP  Insulin Pen Needle (B-D ULTRAFINE III SHORT PEN) 31G X 8 MM MISC USE AS DIRECTED 04/24/17   Burnard Hawthorne, FNP  Multiple Vitamin (MULTIVITAMIN) capsule Take 1 capsule by mouth daily.      [provider]  simvastatin (ZOCOR) 20 MG tablet Take 1 tablet (20 mg total) by mouth daily at 6 PM. 12/31/16   Arnett, Yvetta Coder, FNP  vitamin D, CHOLECALCIFEROL, 400 UNITS tablet Take 400 Units by mouth 2 (two) times daily.      [provider]     Allergies Patient has no known allergies.  Family History  Problem  Relation Age of Onset  . Diabetes Sister        3/5 sisters have DM  . Breast cancer Neg Hx   . Colon cancer Neg Hx     Social History Social History   Tobacco Use  . Smoking status: Former Smoker    Last attempt to quit: 06/18/2005    Years since quitting: 12.4  . Smokeless tobacco: Never Used  Substance Use Topics  . Alcohol use: No  . Drug use: No    Review of Systems completed after improvement mental status  Constitutional: No fever/chills Eyes: No visual changes.  ENT: No sore throat. Cardiovascular: Denies chest pain. Respiratory: Denies shortness of breath. Gastrointestinal: No abdominal pain.     Genitourinary: Negative for dysuria. Musculoskeletal: Negative for back pain. Skin: Negative for rash. Neurological: Negative for headaches   ____________________________________________   PHYSICAL EXAM:  VITAL SIGNS: ED Triage Vitals [11/14/17 1350]  Enc Vitals Group     BP (!) 199/72     Pulse Rate 64     Resp 12     Temp      Temp src      SpO2 97 %     Weight 94.8 kg (209 lb)     Height 1.651 m (5' 5")     Head Circumference      Peak Flow      Pain Score      Pain Loc      Pain Edu?      Excl. in [place]?     Constitutional: Patient presents altered and confused, diaphoretic ill-appearing Eyes: Conjunctivae are normal.  Head: Atraumatic. Nose: No congestion/rhinnorhea. Mouth/Throat: Mucous membranes are moist.   Neck:  Painless ROM Cardiovascular: Normal rate, regular rhythm. Grossly normal heart sounds.  Good peripheral circulation. Respiratory: Normal respiratory effort.  No retractions. Lungs CTAB. Gastrointestinal: Soft and nontender. No distention.  Musculoskeletal: Warm and well perfused Neurologic:  Normal speech and language. No gross focal neurologic deficits are appreciated.  Skin:  Skin is warm, diaphoretic Psychiatric: Unable to examine initially ____________________________________________   LABS (all labs ordered are listed, but only abnormal results are displayed)  Labs Reviewed  GLUCOSE, CAPILLARY - Abnormal; Notable for the following components:      Result Value   Glucose-Capillary 42 (*)    All other components within normal limits  CBC - Abnormal; Notable for the following components:   Hemoglobin 11.9 (*)    All other components within normal limits  COMPREHENSIVE METABOLIC PANEL - Abnormal; Notable for the following components:   Glucose, Bld 105 (*)    Creatinine, Ser 1.27 (*)    Calcium 8.4 (*)    Total Protein 6.4 (*)    Albumin 3.2 (*)    ALT 13 (*)    GFR calc non Af Amer 46 (*)    GFR calc Af Amer 53 (*)    All other  components within normal limits  TROPONIN I  URINALYSIS, COMPLETE (UACMP) WITH MICROSCOPIC   ____________________________________________  EKG  ED ECG REPORT I, Lavonia Drafts, the attending physician, personally viewed and interpreted this ECG.  Date: 11/14/2017  Rate: 66 Rhythm: normal sinus rhythm QRS Axis: normal Intervals: normal ST/T Wave abnormalities: normal Narrative Interpretation: no evidence of acute ischemia  ____________________________________________  RADIOLOGY  Chest x-ray unremarkable ____________________________________________   PROCEDURES  Procedure(s) performed: No  Procedures   Critical Care performed:yes  CRITICAL CARE Performed by: Lavonia Drafts   Total critical care time: 30 minutes  Critical  care time was exclusive of separately billable procedures and treating other patients.  Critical care was necessary to treat or prevent imminent or life-threatening deterioration.  Critical care was time spent personally by me on the following activities: development of treatment plan with patient and/or surrogate as well as nursing, discussions with consultants, evaluation of patient's response to treatment, examination of patient, obtaining history from patient or surrogate, ordering and performing treatments and interventions, ordering and review of laboratory studies, ordering and review of radiographic studies, pulse oximetry and re-evaluation of patient's condition.  ____________________________________________   INITIAL IMPRESSION / ASSESSMENT AND PLAN / ED COURSE  Pertinent labs & imaging results that were available during my care of the patient were reviewed by me and considered in my medical decision making (see chart for details).  Patient presents with altered mental status, found to have glucose of 42.  Emergently given 1 amp of D50 IV with rapid response and improvement in mental status.   No evidence of infectious etiology to  explain hypoglycemia.  Very mild elevation in creatinine  ----------------------------------------- 2:27 PM on 11/14/2017 -----------------------------------------  Patient well-appearing, will monitor in the emergency department.    ____________________________________________   FINAL CLINICAL IMPRESSION(S) / ED DIAGNOSES  Final diagnoses:  Hypoglycemia        Note:  This document was prepared using Dragon voice recognition software and may include unintentional dictation errors.      Lavonia Drafts, MD 11/14/17 [phone]

## 2017-11-21 ENCOUNTER — Telehealth: Payer: Self-pay | Admitting: Family

## 2017-11-21 DIAGNOSIS — I313 Pericardial effusion (noninflammatory): Secondary | ICD-10-CM

## 2017-11-21 DIAGNOSIS — I3139 Other pericardial effusion (noninflammatory): Secondary | ICD-10-CM

## 2017-11-21 NOTE — Telephone Encounter (Signed)
Patient advised wants me to call and schedule appointment with Dr Gabriel Carina

## 2017-11-21 NOTE — Telephone Encounter (Signed)
Please call patient I see that she was emergency room for hypoglycemia.  She is type I diabetic and follows with endocrine, Dr. Gabriel Carina.  I am very concerned by this.  She needs to call Dr. Gabriel Carina; ensure she has an appointment ASAP to address blood sugar.

## 2017-11-21 NOTE — Telephone Encounter (Signed)
Patient advised and verbalized understanding 

## 2017-11-21 NOTE — Telephone Encounter (Signed)
Call pt I received results of her CT lung cancer screening.  It does show atherosclerosis which essentially cholesterol buildup in the arteries.  Please stay on Zocor.  Last cholesterol panel, LDL was at goal.  It also found there is a small amount of dependent pericardial fluid which essentially means there is fluid starting the heart.  I would like for her to see cardiology for this to ensure it is nothing of concern.  A referral has been placed back to [facility] ( who she has seen in the past)  Please let us know if she is not contacted.  Lungs do show emphysema, scarring.  No worrisome nodules which is good to see.   Looks like she also has a stone in her gallbladder.  If she has any right sided abdominal pain, or pain with meals or after meals.  Please ensure that she makes a follow-up appointment with [name] as this could be a gallbladder etiology.

## 2017-11-24 NOTE — Telephone Encounter (Signed)
Called Dr Joycie Peek office and scheduled appointment for 11/26/17 @230  pm and patient will call back to cancel and be seen at 12/16/17.

## 2017-11-25 DIAGNOSIS — I10 Essential (primary) hypertension: Secondary | ICD-10-CM | POA: Diagnosis not present

## 2017-11-26 ENCOUNTER — Telehealth: Payer: Self-pay | Admitting: Family

## 2017-11-26 NOTE — Telephone Encounter (Signed)
Copied from [place] [id number]. Topic: Quick Communication - Rx Refill/Question >> Nov 26, 2017 12:46 PM Lennox Solders wrote: Medication: enalapril 20 mg  Has the patient contacted their pharmacy? Yes (Agent: If no, request that the patient contact the pharmacy for the refill.) (Agent: If yes, when and what did the pharmacy advise?)  Preferred Pharmacy (with phone number or street name):[facility] [address] [place] Agent: Please be advised that RX refills may take up to 3 business days. We ask that you follow-up with your pharmacy.

## 2017-11-26 NOTE — Telephone Encounter (Signed)
Noted  Of note; patient moved appt to later.

## 2017-11-26 NOTE — Telephone Encounter (Signed)
Spoke with Chelsea Barton at [facility] regarding refill of Enalapril 20 mg. Pt has prescription on file and refill available. Pharmacy will refill requested medication.

## 2017-11-27 ENCOUNTER — Ambulatory Visit: Payer: Medicaid Other | Admitting: Podiatry

## 2017-11-27 ENCOUNTER — Encounter: Payer: Self-pay | Admitting: Podiatry

## 2017-11-27 DIAGNOSIS — E1059 Type 1 diabetes mellitus with other circulatory complications: Secondary | ICD-10-CM

## 2017-11-27 DIAGNOSIS — B351 Tinea unguium: Secondary | ICD-10-CM

## 2017-11-27 DIAGNOSIS — M79676 Pain in unspecified toe(s): Secondary | ICD-10-CM

## 2017-11-27 NOTE — Progress Notes (Signed)
Complaint:  Visit Type: Patient returns to my office for continued preventative foot care services. Complaint: Patient states" my nails have grown long and thick and become painful to walk and wear shoes" Patient has been diagnosed with DM with no foot complications. The patient presents for preventative foot care services. No changes to ROS  Podiatric Exam: Vascular: dorsalis pedis and posterior tibial pulses are barely  palpable bilateral. Capillary return is immediate. Temperature gradient is WNL. Skin turgor WNL  Sensorium: Diminished  Semmes Weinstein monofilament test. Normal tactile sensation bilaterally. Nail Exam: Pt has thick disfigured discolored nails with subungual debris noted bilateral entire nail hallux through fifth toenails Ulcer Exam: There is no evidence of ulcer or pre-ulcerative changes or infection. Orthopedic Exam: Muscle tone and strength are WNL. No limitations in general ROM. No crepitus or effusions noted. HAV  B/L.  Skin: No Porokeratosis. No infection or ulcers.  Callus along the lateral heel right foot.  Asymptomatic.  Diagnosis:  Onychomycosis, , Pain in right toe, pain in left toes  Treatment & Plan Procedures and Treatment: Consent by patient was obtained for treatment procedures. The patient understood the discussion of treatment and procedures well. All questions were answered thoroughly reviewed. Debridement of mycotic and hypertrophic toenails, 1 through 5 bilateral and clearing of subungual debris. No ulceration, no infection noted.  Return Visit-Office Procedure: Patient instructed to return to the office for a follow up visit 4 months for continued evaluation and treatment.    Latrel Szymczak DPM 

## 2017-12-15 DIAGNOSIS — E10649 Type 1 diabetes mellitus with hypoglycemia without coma: Secondary | ICD-10-CM | POA: Diagnosis not present

## 2017-12-16 DIAGNOSIS — E1042 Type 1 diabetes mellitus with diabetic polyneuropathy: Secondary | ICD-10-CM | POA: Diagnosis not present

## 2017-12-16 DIAGNOSIS — E1029 Type 1 diabetes mellitus with other diabetic kidney complication: Secondary | ICD-10-CM | POA: Diagnosis not present

## 2017-12-16 DIAGNOSIS — E103553 Type 1 diabetes mellitus with stable proliferative diabetic retinopathy, bilateral: Secondary | ICD-10-CM | POA: Diagnosis not present

## 2017-12-16 DIAGNOSIS — E1022 Type 1 diabetes mellitus with diabetic chronic kidney disease: Secondary | ICD-10-CM | POA: Diagnosis not present

## 2017-12-22 ENCOUNTER — Ambulatory Visit: Payer: Medicaid Other | Admitting: Family

## 2017-12-22 ENCOUNTER — Other Ambulatory Visit: Payer: Self-pay | Admitting: Family

## 2017-12-22 DIAGNOSIS — E785 Hyperlipidemia, unspecified: Secondary | ICD-10-CM

## 2017-12-22 DIAGNOSIS — Z0289 Encounter for other administrative examinations: Secondary | ICD-10-CM

## 2017-12-26 DIAGNOSIS — Z794 Long term (current) use of insulin: Secondary | ICD-10-CM | POA: Diagnosis not present

## 2017-12-26 DIAGNOSIS — E11319 Type 2 diabetes mellitus with unspecified diabetic retinopathy without macular edema: Secondary | ICD-10-CM | POA: Diagnosis not present

## 2017-12-26 DIAGNOSIS — I509 Heart failure, unspecified: Secondary | ICD-10-CM | POA: Diagnosis not present

## 2017-12-28 ENCOUNTER — Other Ambulatory Visit: Payer: Self-pay | Admitting: Family

## 2017-12-28 DIAGNOSIS — I10 Essential (primary) hypertension: Secondary | ICD-10-CM

## 2017-12-29 ENCOUNTER — Other Ambulatory Visit: Payer: Self-pay | Admitting: Family

## 2017-12-29 ENCOUNTER — Telehealth: Payer: Self-pay | Admitting: Family

## 2017-12-29 DIAGNOSIS — I10 Essential (primary) hypertension: Secondary | ICD-10-CM

## 2017-12-29 DIAGNOSIS — I2510 Atherosclerotic heart disease of native coronary artery without angina pectoris: Secondary | ICD-10-CM

## 2017-12-29 DIAGNOSIS — I059 Rheumatic mitral valve disease, unspecified: Secondary | ICD-10-CM

## 2017-12-29 NOTE — Telephone Encounter (Signed)
Copied from [place] [id number]. Topic: Quick Communication - Rx Refill/Question >> Dec 29, 2017  4:35 PM Gardiner Ramus wrote: Medication: digoxin (DIGOX) 0.125 MG tablet [[id number]] enalapril (VASOTEC) 20 MG tablet [[id number]] carvedilol (COREG) 12.5 MG tablet [[id number]]   Has the patient contacted their pharmacy? Yes Preferred Pharmacy (with phone number or street name): [id number] - [place], [place] - [place] [phone] (Phone) [phone] (Fax)   Agent: Please be advised that RX refills may take up to 3 business days. We ask that you follow-up with your pharmacy.

## 2017-12-30 DIAGNOSIS — N183 Chronic kidney disease, stage 3 (moderate): Secondary | ICD-10-CM | POA: Diagnosis not present

## 2017-12-30 DIAGNOSIS — E1042 Type 1 diabetes mellitus with diabetic polyneuropathy: Secondary | ICD-10-CM | POA: Diagnosis not present

## 2017-12-30 DIAGNOSIS — E1022 Type 1 diabetes mellitus with diabetic chronic kidney disease: Secondary | ICD-10-CM | POA: Diagnosis not present

## 2017-12-30 MED ORDER — ENALAPRIL MALEATE 20 MG PO TABS: 40.0000 mg | ORAL_TABLET | Freq: Every day | ORAL | 0 refills | Status: DC

## 2017-12-30 MED ORDER — DIGOXIN 125 MCG PO TABS: ORAL_TABLET | ORAL | 0 refills | Status: DC

## 2018-01-06 DIAGNOSIS — E1022 Type 1 diabetes mellitus with diabetic chronic kidney disease: Secondary | ICD-10-CM | POA: Diagnosis not present

## 2018-01-06 DIAGNOSIS — E103553 Type 1 diabetes mellitus with stable proliferative diabetic retinopathy, bilateral: Secondary | ICD-10-CM | POA: Diagnosis not present

## 2018-01-06 DIAGNOSIS — E1029 Type 1 diabetes mellitus with other diabetic kidney complication: Secondary | ICD-10-CM | POA: Diagnosis not present

## 2018-01-06 DIAGNOSIS — E1042 Type 1 diabetes mellitus with diabetic polyneuropathy: Secondary | ICD-10-CM | POA: Diagnosis not present

## 2018-01-09 DIAGNOSIS — E11319 Type 2 diabetes mellitus with unspecified diabetic retinopathy without macular edema: Secondary | ICD-10-CM | POA: Diagnosis not present

## 2018-01-09 DIAGNOSIS — I509 Heart failure, unspecified: Secondary | ICD-10-CM | POA: Diagnosis not present

## 2018-01-09 DIAGNOSIS — Z794 Long term (current) use of insulin: Secondary | ICD-10-CM | POA: Diagnosis not present

## 2018-01-15 DIAGNOSIS — E10649 Type 1 diabetes mellitus with hypoglycemia without coma: Secondary | ICD-10-CM | POA: Diagnosis not present

## 2018-01-21 DIAGNOSIS — E1022 Type 1 diabetes mellitus with diabetic chronic kidney disease: Secondary | ICD-10-CM | POA: Diagnosis not present

## 2018-01-21 DIAGNOSIS — N183 Chronic kidney disease, stage 3 (moderate): Secondary | ICD-10-CM | POA: Diagnosis not present

## 2018-01-23 DIAGNOSIS — Z794 Long term (current) use of insulin: Secondary | ICD-10-CM | POA: Diagnosis not present

## 2018-01-23 DIAGNOSIS — I509 Heart failure, unspecified: Secondary | ICD-10-CM | POA: Diagnosis not present

## 2018-01-23 DIAGNOSIS — E11319 Type 2 diabetes mellitus with unspecified diabetic retinopathy without macular edema: Secondary | ICD-10-CM | POA: Diagnosis not present

## 2018-01-26 ENCOUNTER — Ambulatory Visit: Payer: Medicaid Other | Admitting: Family

## 2018-01-26 ENCOUNTER — Encounter: Payer: Self-pay | Admitting: Family

## 2018-01-26 DIAGNOSIS — E1039 Type 1 diabetes mellitus with other diabetic ophthalmic complication: Secondary | ICD-10-CM

## 2018-01-26 DIAGNOSIS — I10 Essential (primary) hypertension: Secondary | ICD-10-CM

## 2018-01-26 DIAGNOSIS — N189 Chronic kidney disease, unspecified: Secondary | ICD-10-CM

## 2018-01-26 NOTE — Progress Notes (Signed)
I called [facility] and she did come to her 5/30 appt

## 2018-01-26 NOTE — Assessment & Plan Note (Signed)
Unchanged. Unable to see nephrology consult notes. Will get medical release form to see notes.

## 2018-01-26 NOTE — Progress Notes (Signed)
Subjective:    Patient ID: Chelsea Barton, female    DOB: 1961-01-23, 57 y.o.   MRN: [id number]  CC: Chelsea Barton is a 57 y.o. female who presents today for follow up.   HPI: Feeling well. No new complaints.   DM- A1c 7.2% 12/2017. has had hypoglycemic episodes, has a 40 blood sugar reading everyday, no particular time of day. Eating more, and has gained weight.  CGM continues to come off due to sweat. Decreased lantus from 45-40 units, and decreased novolog from 6 to 5 with breakfast, and lunch 10 units , and dinner is 10 units. Low blood sugars have continued however not dropped as low as in the past, such as 20 per patient. Hasnt had syncopal episode since changes made.    CKD- states has seen nephrology however unable to find report in Epic ; On enlapril.   HTN- compliant with medication. At home 140/80. Denies exertional chest pain or pressure, numbness or tingling radiating to left arm or jaw, palpitations, dizziness, frequent headaches, changes in vision, or shortness of breath.    Cardiology appt scheduled for 05/28/18, she also saw Horris Latino 06/2017.   DM I - follows with endocrine; reviewed note from 12/16/17  Echo 2014 EF 45% - on digoxin  HISTORY:  Past Medical History:  Diagnosis Date  . Diabetes mellitus   . Hyperlipidemia   . Hypertension    History reviewed. No pertinent surgical history. Family History  Problem Relation Age of Onset  . Diabetes Sister        3/5 sisters have DM  . Breast cancer Neg Hx   . Colon cancer Neg Hx     Allergies: Patient has no known allergies. Current Outpatient Medications on File Prior to Visit  Medication Sig Dispense Refill  . ACCU-CHEK AVIVA PLUS test strip USE UP TO FOUR TIMES DAILY 350 each 1  . ACCU-CHEK FASTCLIX LANCETS MISC USE UP TO FOUR TIMES DAILY 306 each 0  . aspirin 81 MG tablet Take 1 tablet (81 mg total) by mouth daily. 30 tablet 6  . blood glucose meter kit and supplies KIT Dispense based on patient and insurance  preference. Use up to four times daily as directed. (FOR ICD-9 250.00, 250.01). 1 each 0  . carvedilol (COREG) 12.5 MG tablet TAKE 1 TABLET BY MOUTH TWICE DAILY WITH A MEAL 180 tablet 0  . digoxin (DIGOX) 0.125 MG tablet TAKE 1 TABLET BY MOUTH EVERY DAY 90 tablet 0  . enalapril (VASOTEC) 20 MG tablet Take 2 tablets (40 mg total) by mouth daily. TAKE 2 TABLETS(20 MG) BY MOUTH DAILY 180 tablet 0  . insulin aspart (NOVOLOG) 100 UNIT/ML injection Inject 6 units at breakfast, 10 units at lunch, and 10 units at supper 1 vial 11  . Insulin Glargine (LANTUS SOLOSTAR) 100 UNIT/ML Solostar Pen Inject 45 Units into the skin at bedtime. (Patient taking differently: Inject 40 Units into the skin at bedtime. ) 10 pen 6  . Insulin Pen Needle (B-D ULTRAFINE III SHORT PEN) 31G X 8 MM MISC USE AS DIRECTED 100 each 6  . Insulin Pen Needle (B-D ULTRAFINE III SHORT PEN) 31G X 8 MM MISC USE AS DIRECTED 100 each 0  . Multiple Vitamin (MULTIVITAMIN) capsule Take 1 capsule by mouth daily.      . simvastatin (ZOCOR) 20 MG tablet TAKE 1 TABLET(20 MG) BY MOUTH DAILY AT 6 PM 90 tablet 0  . vitamin D, CHOLECALCIFEROL, 400 UNITS tablet Take 400 Units by  mouth 2 (two) times daily.       No current facility-administered medications on file prior to visit.     Social History   Tobacco Use  . Smoking status: Former Smoker    Last attempt to quit: 06/18/2005    Years since quitting: 12.6  . Smokeless tobacco: Never Used  Substance Use Topics  . Alcohol use: No  . Drug use: No    Review of Systems  Constitutional: Negative for chills and fever.  Respiratory: Negative for cough.   Cardiovascular: Negative for chest pain and palpitations.  Gastrointestinal: Negative for nausea and vomiting.      Objective:    BP (!) 142/82 (BP Location: Left Arm, Patient Position: Sitting, Cuff Size: Large)   Pulse 77   Temp 97.7 F (36.5 C) (Oral)   Resp 16   Wt 211 lb 8 oz (95.9 kg)   SpO2 97%   BMI 35.20 kg/m  BP Readings  from Last 3 Encounters:  01/26/18 (!) 142/82  11/14/17 (!) 217/90  10/22/17 140/84   Wt Readings from Last 3 Encounters:  01/26/18 211 lb 8 oz (95.9 kg)  11/14/17 209 lb (94.8 kg)  10/22/17 207 lb 4 oz (94 kg)    Physical Exam  Constitutional: She appears well-developed and well-nourished.  Eyes: Conjunctivae are normal.  Cardiovascular: Normal rate, regular rhythm, normal heart sounds and normal pulses.  Pulmonary/Chest: Effort normal and breath sounds normal. She has no wheezes. She has no rhonchi. She has no rales.  Neurological: She is alert.  Skin: Skin is warm and dry.  Psychiatric: She has a normal mood and affect. Her speech is normal and behavior is normal. Thought content normal.  Vitals reviewed.      Assessment & Plan:   Problem List Items Addressed This Visit      Cardiovascular and Mediastinum   Hypertension    Overall controlled. No changes made today. Note: consulting pharmacy on digoxin dose in setting of CKD. This medication going forward will be refilled by cardiology.        Endocrine   DM type 1 (diabetes mellitus, type 1) ([place])    Following with [facility]. Last a1c 7.3. Reports lows today despite insulin adjustments. Pending call to their office, Cyril Mourning will let them know and I have also strongly advised patient to call and let them know of hypoglycemic episodes. She verbalized understanding of this.         Genitourinary   CKD (chronic kidney disease)    Unchanged. Unable to see nephrology consult notes. Will get medical release form to see notes.          I am having Tykeria D. Schweer maintain her multivitamin, vitamin D (CHOLECALCIFEROL), aspirin, insulin aspart, Insulin Glargine, Insulin Pen Needle, Insulin Pen Needle, blood glucose meter kit and supplies, ACCU-CHEK FASTCLIX LANCETS, ACCU-CHEK AVIVA PLUS, simvastatin, carvedilol, enalapril, and digoxin.   No orders of the defined types were placed in this encounter.   Return precautions  given.   Risks, benefits, and alternatives of the medications and treatment plan prescribed today were discussed, and patient expressed understanding.   Education regarding symptom management and diagnosis given to patient on AVS.  Continue to follow with Burnard Hawthorne, FNP for routine health maintenance.   Jakaya D Blyth and I agreed with plan.   Mable Paris, FNP

## 2018-01-26 NOTE — Assessment & Plan Note (Signed)
Overall controlled. No changes made today. Note: consulting pharmacy on digoxin dose in setting of CKD. This medication going forward will be refilled by cardiology.

## 2018-01-26 NOTE — Patient Instructions (Signed)
Please call Dr Gabriel Carina regarding your low blood sugars, I am concerned that you are still having lows.   Hypoglycemia Hypoglycemia is when the sugar (glucose) level in the blood is too low. Symptoms of low blood sugar may include:  Feeling: ? Hungry. ? Worried or nervous (anxious). ? Sweaty and clammy. ? Confused. ? Dizzy. ? Sleepy. ? Sick to your stomach (nauseous).  Having: ? A fast heartbeat. ? A headache. ? A change in your vision. ? Jerky movements that you cannot control (seizure). ? Nightmares. ? Tingling or no feeling (numbness) around the mouth, lips, or tongue.  Having trouble with: ? Talking. ? Paying attention (concentrating). ? Moving (coordination). ? Sleeping.  Shaking.  Passing out (fainting).  Getting upset easily (irritability).  Low blood sugar can happen to people who have diabetes and people who do not have diabetes. Low blood sugar can happen quickly, and it can be an emergency. Treating Low Blood Sugar Low blood sugar is often treated by eating or drinking something sugary right away. If you can think clearly and swallow safely, follow the 15:15 rule:  Take 15 grams of a fast-acting carb (carbohydrate). Some fast-acting carbs are: ? 1 tube of glucose gel. ? 3 sugar tablets (glucose pills). ? 6-8 pieces of hard candy. ? 4 oz (120 mL) of fruit juice. ? 4 oz (120 mL) of regular (not diet) soda.  Check your blood sugar 15 minutes after you take the carb.  If your blood sugar is still at or below 70 mg/dL (3.9 mmol/L), take 15 grams of a carb again.  If your blood sugar does not go above 70 mg/dL (3.9 mmol/L) after 3 tries, get help right away.  After your blood sugar goes back to normal, eat a meal or a snack within 1 hour.  Treating Very Low Blood Sugar If your blood sugar is at or below 54 mg/dL (3 mmol/L), you have very low blood sugar (severe hypoglycemia). This is an emergency. Do not wait to see if the symptoms will go away. Get medical  help right away. Call your local emergency services (911 in the [place] Do not drive yourself to the hospital. If you have very low blood sugar and you cannot eat or drink, you may need a glucagon shot (injection). A family member or friend should learn how to check your blood sugar and how to give you a glucagon shot. Ask your doctor if you need to have a glucagon shot kit at home. Follow these instructions at home: General instructions  Avoid any diets that cause you to not eat enough food. Talk with your doctor before you start any new diet.  Take over-the-counter and prescription medicines only as told by your doctor.  Limit alcohol to no more than 1 drink per day for nonpregnant women and 2 drinks per day for men. One drink equals 12 oz of beer, 5 oz of wine, or 1 oz of hard liquor.  Keep all follow-up visits as told by your doctor. This is important. If You Have Diabetes:   Make sure you know the symptoms of low blood sugar.  Always keep a source of sugar with you, such as: ? Sugar. ? Sugar tablets. ? Glucose gel. ? Fruit juice. ? Regular soda (not diet soda). ? Milk. ? Hard candy. ? Honey.  Take your medicines as told.  Follow your exercise and meal plan. ? Eat on time. Do not skip meals. ? Follow your sick day plan when you cannot  eat or drink normally. Make this plan ahead of time with your doctor.  Check your blood sugar as often as told by your doctor. Always check before and after exercise.  Share your diabetes care plan with: ? Your work or school. ? People you live with.  Check your pee (urine) for ketones: ? When you are sick. ? As told by your doctor.  Carry a card or wear jewelry that says you have diabetes. If You Have Low Blood Sugar From Other Causes:   Check your blood sugar as often as told by your doctor.  Follow instructions from your doctor about what you cannot eat or drink. Contact a doctor if:  You have trouble keeping your blood sugar  in your target range.  You have low blood sugar often. Get help right away if:  You still have symptoms after you eat or drink something sugary.  Your blood sugar is at or below 54 mg/dL (3 mmol/L).  You have jerky movements that you cannot control.  You pass out. These symptoms may be an emergency. Do not wait to see if the symptoms will go away. Get medical help right away. Call your local emergency services (911 in the [place] Do not drive yourself to the hospital. This information is not intended to replace advice given to you by your health care provider. Make sure you discuss any questions you have with your health care provider. Document Released: 08/28/2009 Document Revised: 11/09/2015 Document Reviewed: 07/07/2015 Elsevier Interactive Patient Education  [employer].

## 2018-01-26 NOTE — Assessment & Plan Note (Signed)
Following with [facility]. Last a1c 7.3. Reports lows today despite insulin adjustments. Pending call to their office, Cyril Mourning will let them know and I have also strongly advised patient to call and let them know of hypoglycemic episodes. She verbalized understanding of this.

## 2018-01-28 NOTE — Progress Notes (Signed)
Spoke to Dr [employer] office and they will follow up with patient in regards to low blood sugar.   Office notes faxed to Dr [employer] office .

## 2018-01-28 NOTE — Progress Notes (Signed)
Mailed medical records release for patient to complete , she will mail back to office

## 2018-02-06 ENCOUNTER — Telehealth: Payer: Self-pay | Admitting: Family

## 2018-02-06 DIAGNOSIS — E11319 Type 2 diabetes mellitus with unspecified diabetic retinopathy without macular edema: Secondary | ICD-10-CM | POA: Diagnosis not present

## 2018-02-06 NOTE — Telephone Encounter (Signed)
Please advise 

## 2018-02-06 NOTE — Telephone Encounter (Signed)
Copied from [place] [phone] [id number]. Topic: Quick Communication - See Telephone Encounter >> Feb 06, 2018  1:38 PM Gardiner Ramus wrote: CRM for notification. See Telephone encounter for: 02/06/18. [name] from [facility] calling to get verbal orders for pill box fills [phone]

## 2018-02-09 NOTE — Telephone Encounter (Signed)
Verbal given for pill box refills .

## 2018-02-09 NOTE — Telephone Encounter (Signed)
Calling to check on status of verbal orders.

## 2018-02-11 DIAGNOSIS — E11319 Type 2 diabetes mellitus with unspecified diabetic retinopathy without macular edema: Secondary | ICD-10-CM | POA: Diagnosis not present

## 2018-02-11 DIAGNOSIS — E113553 Type 2 diabetes mellitus with stable proliferative diabetic retinopathy, bilateral: Secondary | ICD-10-CM | POA: Diagnosis not present

## 2018-02-11 LAB — HM DIABETES EYE EXAM

## 2018-02-17 DIAGNOSIS — E10649 Type 1 diabetes mellitus with hypoglycemia without coma: Secondary | ICD-10-CM | POA: Diagnosis not present

## 2018-02-20 DIAGNOSIS — E11319 Type 2 diabetes mellitus with unspecified diabetic retinopathy without macular edema: Secondary | ICD-10-CM | POA: Diagnosis not present

## 2018-03-02 DIAGNOSIS — I509 Heart failure, unspecified: Secondary | ICD-10-CM

## 2018-03-02 DIAGNOSIS — Z794 Long term (current) use of insulin: Secondary | ICD-10-CM

## 2018-03-02 DIAGNOSIS — E11319 Type 2 diabetes mellitus with unspecified diabetic retinopathy without macular edema: Secondary | ICD-10-CM | POA: Diagnosis not present

## 2018-03-06 DIAGNOSIS — E11319 Type 2 diabetes mellitus with unspecified diabetic retinopathy without macular edema: Secondary | ICD-10-CM | POA: Diagnosis not present

## 2018-03-16 DIAGNOSIS — N183 Chronic kidney disease, stage 3 (moderate): Secondary | ICD-10-CM | POA: Diagnosis not present

## 2018-03-16 DIAGNOSIS — I129 Hypertensive chronic kidney disease with stage 1 through stage 4 chronic kidney disease, or unspecified chronic kidney disease: Secondary | ICD-10-CM | POA: Diagnosis not present

## 2018-03-16 DIAGNOSIS — R809 Proteinuria, unspecified: Secondary | ICD-10-CM | POA: Diagnosis not present

## 2018-03-16 DIAGNOSIS — E1022 Type 1 diabetes mellitus with diabetic chronic kidney disease: Secondary | ICD-10-CM | POA: Diagnosis not present

## 2018-03-17 DIAGNOSIS — E10649 Type 1 diabetes mellitus with hypoglycemia without coma: Secondary | ICD-10-CM | POA: Diagnosis not present

## 2018-03-20 ENCOUNTER — Other Ambulatory Visit: Payer: Self-pay | Admitting: Family

## 2018-03-20 DIAGNOSIS — E785 Hyperlipidemia, unspecified: Secondary | ICD-10-CM

## 2018-03-20 DIAGNOSIS — I10 Essential (primary) hypertension: Secondary | ICD-10-CM

## 2018-03-20 DIAGNOSIS — I059 Rheumatic mitral valve disease, unspecified: Secondary | ICD-10-CM

## 2018-03-20 DIAGNOSIS — I2510 Atherosclerotic heart disease of native coronary artery without angina pectoris: Secondary | ICD-10-CM

## 2018-03-20 DIAGNOSIS — E11319 Type 2 diabetes mellitus with unspecified diabetic retinopathy without macular edema: Secondary | ICD-10-CM | POA: Diagnosis not present

## 2018-03-23 ENCOUNTER — Ambulatory Visit: Payer: Medicaid Other | Admitting: Podiatry

## 2018-03-27 ENCOUNTER — Telehealth: Payer: Self-pay | Admitting: Family

## 2018-03-27 DIAGNOSIS — Z1231 Encounter for screening mammogram for malignant neoplasm of breast: Secondary | ICD-10-CM

## 2018-03-27 NOTE — Telephone Encounter (Signed)
Call pt  Your mammogram appears due. She was supposed to have  annual routine screening mammography, due June 2019.  Please ask pt if we can schedule for her

## 2018-04-01 NOTE — Telephone Encounter (Signed)
Left voice mail for patient to call back ok for PEC to speak to patient    mammogram at [facility]  November 8th@2 :20pm

## 2018-04-01 NOTE — Telephone Encounter (Signed)
  Left voice mail for patient to call back ok for PEC to speak to patient , see message below    Tried calling patient she is scheduled for mammogram on November 8th @2 :20pm

## 2018-04-02 DIAGNOSIS — E11319 Type 2 diabetes mellitus with unspecified diabetic retinopathy without macular edema: Secondary | ICD-10-CM | POA: Diagnosis not present

## 2018-04-08 NOTE — Telephone Encounter (Signed)
Left voice mail for patient to call back ok for PEC to speak to patient , please advise of below appointment

## 2018-04-09 ENCOUNTER — Telehealth: Payer: Self-pay | Admitting: Family

## 2018-04-09 NOTE — Telephone Encounter (Signed)
Verbal given for nursing. FYI

## 2018-04-09 NOTE — Telephone Encounter (Signed)
Patient advised of appointment for mammogram .

## 2018-04-09 NOTE — Telephone Encounter (Signed)
Copied from [place] [id number]. Topic: Quick Communication - Home Health Verbal Orders >> Apr 09, 2018 10:07 AM Cecelia Byars, NT wrote: Caller/Agency: [facility]  Callback Number:[phone]  ok to leave a message   Requesting Nursing/ Frequency:  1 ervery other  week for pill box fill and diabetes management for 9 weeks

## 2018-04-10 DIAGNOSIS — E11319 Type 2 diabetes mellitus with unspecified diabetic retinopathy without macular edema: Secondary | ICD-10-CM | POA: Diagnosis not present

## 2018-04-15 DIAGNOSIS — E103553 Type 1 diabetes mellitus with stable proliferative diabetic retinopathy, bilateral: Secondary | ICD-10-CM | POA: Diagnosis not present

## 2018-04-16 ENCOUNTER — Encounter: Payer: Self-pay | Admitting: Podiatry

## 2018-04-16 ENCOUNTER — Encounter

## 2018-04-16 ENCOUNTER — Ambulatory Visit: Payer: Medicaid Other | Admitting: Podiatry

## 2018-04-16 DIAGNOSIS — B351 Tinea unguium: Secondary | ICD-10-CM

## 2018-04-16 DIAGNOSIS — E1059 Type 1 diabetes mellitus with other circulatory complications: Secondary | ICD-10-CM

## 2018-04-16 DIAGNOSIS — M79676 Pain in unspecified toe(s): Secondary | ICD-10-CM | POA: Diagnosis not present

## 2018-04-16 NOTE — Progress Notes (Signed)
Complaint:  Visit Type: Patient returns to my office for continued preventative foot care services. Complaint: Patient states" my nails have grown long and thick and become painful to walk and wear shoes" Patient has been diagnosed with DM with no foot complications. The patient presents for preventative foot care services. No changes to ROS  Podiatric Exam: Vascular: dorsalis pedis and posterior tibial pulses are barely  palpable bilateral. Capillary return is immediate. Temperature gradient is WNL. Skin turgor WNL  Sensorium: Diminished  Semmes Weinstein monofilament test. Normal tactile sensation bilaterally. Nail Exam: Pt has thick disfigured discolored nails with subungual debris noted bilateral entire nail hallux through fifth toenails Ulcer Exam: There is no evidence of ulcer or pre-ulcerative changes or infection. Orthopedic Exam: Muscle tone and strength are WNL. No limitations in general ROM. No crepitus or effusions noted. HAV  B/L.  Skin: No Porokeratosis. No infection or ulcers.  Callus along the lateral heel right foot.  Asymptomatic.  Diagnosis:  Onychomycosis, , Pain in right toe, pain in left toes  Treatment & Plan Procedures and Treatment: Consent by patient was obtained for treatment procedures. The patient understood the discussion of treatment and procedures well. All questions were answered thoroughly reviewed. Debridement of mycotic and hypertrophic toenails, 1 through 5 bilateral and clearing of subungual debris. No ulceration, no infection noted.  Return Visit-Office Procedure: Patient instructed to return to the office for a follow up visit 4 months for continued evaluation and treatment.    Gardiner Barefoot DPM

## 2018-04-17 DIAGNOSIS — E11319 Type 2 diabetes mellitus with unspecified diabetic retinopathy without macular edema: Secondary | ICD-10-CM | POA: Diagnosis not present

## 2018-04-17 DIAGNOSIS — E10649 Type 1 diabetes mellitus with hypoglycemia without coma: Secondary | ICD-10-CM | POA: Diagnosis not present

## 2018-04-22 DIAGNOSIS — E1022 Type 1 diabetes mellitus with diabetic chronic kidney disease: Secondary | ICD-10-CM | POA: Diagnosis not present

## 2018-04-22 DIAGNOSIS — E1042 Type 1 diabetes mellitus with diabetic polyneuropathy: Secondary | ICD-10-CM | POA: Diagnosis not present

## 2018-04-22 DIAGNOSIS — E103553 Type 1 diabetes mellitus with stable proliferative diabetic retinopathy, bilateral: Secondary | ICD-10-CM | POA: Diagnosis not present

## 2018-04-22 DIAGNOSIS — E1029 Type 1 diabetes mellitus with other diabetic kidney complication: Secondary | ICD-10-CM | POA: Diagnosis not present

## 2018-04-23 ENCOUNTER — Ambulatory Visit
Admission: RE | Admit: 2018-04-23 | Discharge: 2018-04-23 | Disposition: A | Discharge status: Home / Self Care | Payer: Medicaid Other | Source: Ambulatory Visit | Attending: Family | Admitting: Family

## 2018-04-23 DIAGNOSIS — Z1231 Encounter for screening mammogram for malignant neoplasm of breast: Secondary | ICD-10-CM | POA: Diagnosis not present

## 2018-04-29 ENCOUNTER — Ambulatory Visit: Payer: Medicaid Other | Admitting: Family

## 2018-05-01 DIAGNOSIS — E11319 Type 2 diabetes mellitus with unspecified diabetic retinopathy without macular edema: Secondary | ICD-10-CM | POA: Diagnosis not present

## 2018-05-15 DIAGNOSIS — E11319 Type 2 diabetes mellitus with unspecified diabetic retinopathy without macular edema: Secondary | ICD-10-CM | POA: Diagnosis not present

## 2018-05-18 DIAGNOSIS — E10649 Type 1 diabetes mellitus with hypoglycemia without coma: Secondary | ICD-10-CM | POA: Diagnosis not present

## 2018-05-25 DIAGNOSIS — E10649 Type 1 diabetes mellitus with hypoglycemia without coma: Secondary | ICD-10-CM | POA: Diagnosis not present

## 2018-05-29 ENCOUNTER — Encounter: Payer: Self-pay | Admitting: Family

## 2018-05-29 ENCOUNTER — Ambulatory Visit: Payer: Medicaid Other | Admitting: Family

## 2018-05-29 DIAGNOSIS — I10 Essential (primary) hypertension: Secondary | ICD-10-CM | POA: Diagnosis not present

## 2018-05-29 DIAGNOSIS — E1039 Type 1 diabetes mellitus with other diabetic ophthalmic complication: Secondary | ICD-10-CM

## 2018-05-29 DIAGNOSIS — N189 Chronic kidney disease, unspecified: Secondary | ICD-10-CM | POA: Diagnosis not present

## 2018-05-29 DIAGNOSIS — E11319 Type 2 diabetes mellitus with unspecified diabetic retinopathy without macular edema: Secondary | ICD-10-CM | POA: Diagnosis not present

## 2018-05-29 NOTE — Assessment & Plan Note (Signed)
Follow with Dr. Holley Raring, annually.  Unable to see notes.  Medical request form completed.

## 2018-05-29 NOTE — Assessment & Plan Note (Addendum)
At goal.  Continue Current regimen.

## 2018-05-29 NOTE — Progress Notes (Addendum)
 Subjective:    Patient ID: Chelsea Barton, female    DOB: 08/03/1960, 57 y.o.   MRN: [id number]  CC: Chelsea Barton is a 57 y.o. female who presents today for follow up.   HPI: Feels well today.  No complaints.  Dm- Hasnt had anymore hypoglycemic episodes. follows with Dr Solum.   HTN- compliant with medication. 130/70 at home. No cp.Stopped eating bacon and sausage. Walks every day, 20 minutes daily.   HLD - on zocor  Will see cardiology in 06/2018  CKD/proteinuria- follows with nephrology, Dr Lateef, yearly. Unable to see note.  Declines flu vaccine.     HISTORY:  Past Medical History:  Diagnosis Date  . Diabetes mellitus   . Hyperlipidemia   . Hypertension    History reviewed. No pertinent surgical history. Family History  Problem Relation Age of Onset  . Diabetes Sister        3/5 sisters have DM  . Breast cancer Neg Hx   . Colon cancer Neg Hx     Allergies: Patient has no known allergies. Current Outpatient Medications on File Prior to Visit  Medication Sig Dispense Refill  . ACCU-CHEK AVIVA PLUS test strip USE UP TO FOUR TIMES DAILY 350 each 1  . ACCU-CHEK FASTCLIX LANCETS MISC USE UP TO FOUR TIMES DAILY 306 each 0  . aspirin 81 MG tablet Take 1 tablet (81 mg total) by mouth daily. 30 tablet 6  . blood glucose meter kit and supplies KIT Dispense based on patient and insurance preference. Use up to four times daily as directed. (FOR ICD-9 250.00, 250.01). 1 each 0  . carvedilol (COREG) 12.5 MG tablet TAKE 1 TABLET BY MOUTH TWICE DAILY WITH A MEAL 180 tablet 1  . digoxin (LANOXIN) 0.125 MG tablet TAKE 1 TABLET BY MOUTH EVERY DAY 90 tablet 1  . enalapril (VASOTEC) 20 MG tablet TAKE 2 TABLETS BY MOUTH DAILY 180 tablet 1  . insulin aspart (NOVOLOG) 100 UNIT/ML injection Inject 6 units at breakfast, 10 units at lunch, and 10 units at supper 1 vial 11  . Insulin Glargine (LANTUS SOLOSTAR) 100 UNIT/ML Solostar Pen Inject 45 Units into the skin at bedtime. (Patient  taking differently: Inject 40 Units into the skin at bedtime. ) 10 pen 6  . Insulin Pen Needle (B-D ULTRAFINE III SHORT PEN) 31G X 8 MM MISC USE AS DIRECTED 100 each 6  . Insulin Pen Needle (B-D ULTRAFINE III SHORT PEN) 31G X 8 MM MISC USE AS DIRECTED 100 each 0  . Multiple Vitamin (MULTIVITAMIN) capsule Take 1 capsule by mouth daily.      . simvastatin (ZOCOR) 20 MG tablet TAKE 1 TABLET(20 MG) BY MOUTH DAILY AT 6 PM 90 tablet 1  . vitamin D, CHOLECALCIFEROL, 400 UNITS tablet Take 400 Units by mouth 2 (two) times daily.       No current facility-administered medications on file prior to visit.     Social History   Tobacco Use  . Smoking status: Former Smoker    Last attempt to quit: 06/18/2005    Years since quitting: 12.9  . Smokeless tobacco: Never Used  Substance Use Topics  . Alcohol use: No  . Drug use: No    Review of Systems  Constitutional: Negative for chills and fever.  Respiratory: Negative for cough.   Cardiovascular: Negative for chest pain and palpitations.  Gastrointestinal: Negative for nausea and vomiting.      Objective:    BP 112/78 (BP Location: Left   Arm, Patient Position: Sitting, Cuff Size: Large)   Pulse 67   Temp 97.6 F (36.4 C) (Oral)   Resp 16   Ht 5' 5" (1.651 m)   Wt 213 lb (96.6 kg)   SpO2 97%   BMI 35.45 kg/m  BP Readings from Last 3 Encounters:  05/29/18 112/78  01/26/18 (!) 142/82  11/14/17 (!) 217/90   Wt Readings from Last 3 Encounters:  05/29/18 213 lb (96.6 kg)  01/26/18 211 lb 8 oz (95.9 kg)  11/14/17 209 lb (94.8 kg)    Physical Exam Vitals signs reviewed.  Constitutional:      Appearance: She is well-developed.  Eyes:     Conjunctiva/sclera: Conjunctivae normal.  Cardiovascular:     Rate and Rhythm: Normal rate and regular rhythm.     Pulses: Normal pulses.     Heart sounds: Normal heart sounds.  Pulmonary:     Effort: Pulmonary effort is normal.     Breath sounds: Normal breath sounds. No wheezing, rhonchi or  rales.  Skin:    General: Skin is warm and dry.  Neurological:     Mental Status: She is alert.  Psychiatric:        Speech: Speech normal.        Behavior: Behavior normal.        Thought Content: Thought content normal.        Assessment & Plan:   Problem List Items Addressed This Visit      Cardiovascular and Mediastinum   Hypertension    At goal.  Current regimen.        Endocrine   DM type 1 (diabetes mellitus, type 1) ([facility])    No further hypoglycemic episodes.  Follows with Dr. Solum        Genitourinary   CKD (chronic kidney disease)    Follow with Dr. Lateef, annually.  Unable to see notes.  Medical request form completed.          I am having Chelsea Barton maintain her multivitamin, vitamin D (CHOLECALCIFEROL), aspirin, insulin aspart, Insulin Glargine, Insulin Pen Needle, Insulin Pen Needle, blood glucose meter kit and supplies, ACCU-CHEK FASTCLIX LANCETS, ACCU-CHEK AVIVA PLUS, carvedilol, enalapril, simvastatin, and digoxin.   No orders of the defined types were placed in this encounter.   Return precautions given.   Risks, benefits, and alternatives of the medications and treatment plan prescribed today were discussed, and patient expressed understanding.   Education regarding symptom management and diagnosis given to patient on AVS.  Continue to follow with Arnett, Margaret G, FNP for routine health maintenance.   Chelsea Barton and I agreed with plan.   Margaret Arnett, FNP   I have reviewed the above information and agree with above.   Teresa Tullo, MD  

## 2018-05-29 NOTE — Patient Instructions (Signed)
Such a pleasure seeing you!

## 2018-05-29 NOTE — Assessment & Plan Note (Signed)
No further hypoglycemic episodes.  Follows with Dr. Gabriel Carina

## 2018-06-01 ENCOUNTER — Other Ambulatory Visit: Payer: Self-pay | Admitting: Family

## 2018-06-01 DIAGNOSIS — I10 Essential (primary) hypertension: Secondary | ICD-10-CM

## 2018-06-11 ENCOUNTER — Telehealth: Payer: Self-pay | Admitting: Family

## 2018-06-11 DIAGNOSIS — E1039 Type 1 diabetes mellitus with other diabetic ophthalmic complication: Secondary | ICD-10-CM

## 2018-06-11 DIAGNOSIS — E11319 Type 2 diabetes mellitus with unspecified diabetic retinopathy without macular edema: Secondary | ICD-10-CM | POA: Diagnosis not present

## 2018-06-11 NOTE — Telephone Encounter (Signed)
Copied from [place] #[id number]. Topic: General - Other >> Jun 11, 2018  9:05 AM Yvette Rack wrote: Reason for CRM: Nurse  [name] [facility] [phone] calling for a new referral for home care and a new face to face report for a new pill box fill please fax orders to [phone]

## 2018-06-18 DIAGNOSIS — E10649 Type 1 diabetes mellitus with hypoglycemia without coma: Secondary | ICD-10-CM | POA: Diagnosis not present

## 2018-06-18 NOTE — Telephone Encounter (Signed)
Chelsea Barton from [place] called to follow up on referral from Dr. Derrel Nip (Supervising MD to [facility]). Please advise.   Fax orders to [phone]

## 2018-06-18 NOTE — Telephone Encounter (Signed)
Please advise 

## 2018-06-22 NOTE — Telephone Encounter (Signed)
I have faxed referral to [facility] with [facility].

## 2018-06-22 NOTE — Telephone Encounter (Signed)
I placed new order Would you fax over as requested below?

## 2018-06-25 DIAGNOSIS — I130 Hypertensive heart and chronic kidney disease with heart failure and stage 1 through stage 4 chronic kidney disease, or unspecified chronic kidney disease: Secondary | ICD-10-CM | POA: Diagnosis not present

## 2018-06-25 DIAGNOSIS — I509 Heart failure, unspecified: Secondary | ICD-10-CM | POA: Diagnosis not present

## 2018-06-25 DIAGNOSIS — H547 Unspecified visual loss: Secondary | ICD-10-CM | POA: Diagnosis not present

## 2018-06-25 DIAGNOSIS — N189 Chronic kidney disease, unspecified: Secondary | ICD-10-CM | POA: Diagnosis not present

## 2018-06-25 DIAGNOSIS — E1039 Type 1 diabetes mellitus with other diabetic ophthalmic complication: Secondary | ICD-10-CM | POA: Diagnosis not present

## 2018-06-25 DIAGNOSIS — E785 Hyperlipidemia, unspecified: Secondary | ICD-10-CM | POA: Diagnosis not present

## 2018-06-25 DIAGNOSIS — I2510 Atherosclerotic heart disease of native coronary artery without angina pectoris: Secondary | ICD-10-CM | POA: Diagnosis not present

## 2018-06-25 DIAGNOSIS — E1022 Type 1 diabetes mellitus with diabetic chronic kidney disease: Secondary | ICD-10-CM | POA: Diagnosis not present

## 2018-06-30 DIAGNOSIS — I10 Essential (primary) hypertension: Secondary | ICD-10-CM | POA: Diagnosis not present

## 2018-06-30 DIAGNOSIS — E1122 Type 2 diabetes mellitus with diabetic chronic kidney disease: Secondary | ICD-10-CM | POA: Diagnosis not present

## 2018-06-30 DIAGNOSIS — I739 Peripheral vascular disease, unspecified: Secondary | ICD-10-CM | POA: Diagnosis not present

## 2018-06-30 DIAGNOSIS — I2510 Atherosclerotic heart disease of native coronary artery without angina pectoris: Secondary | ICD-10-CM | POA: Diagnosis not present

## 2018-07-10 DIAGNOSIS — I130 Hypertensive heart and chronic kidney disease with heart failure and stage 1 through stage 4 chronic kidney disease, or unspecified chronic kidney disease: Secondary | ICD-10-CM | POA: Diagnosis not present

## 2018-07-10 DIAGNOSIS — I509 Heart failure, unspecified: Secondary | ICD-10-CM | POA: Diagnosis not present

## 2018-07-10 DIAGNOSIS — E785 Hyperlipidemia, unspecified: Secondary | ICD-10-CM | POA: Diagnosis not present

## 2018-07-10 DIAGNOSIS — E1039 Type 1 diabetes mellitus with other diabetic ophthalmic complication: Secondary | ICD-10-CM | POA: Diagnosis not present

## 2018-07-10 DIAGNOSIS — H547 Unspecified visual loss: Secondary | ICD-10-CM | POA: Diagnosis not present

## 2018-07-10 DIAGNOSIS — I2510 Atherosclerotic heart disease of native coronary artery without angina pectoris: Secondary | ICD-10-CM | POA: Diagnosis not present

## 2018-07-10 DIAGNOSIS — N189 Chronic kidney disease, unspecified: Secondary | ICD-10-CM | POA: Diagnosis not present

## 2018-07-10 DIAGNOSIS — E1022 Type 1 diabetes mellitus with diabetic chronic kidney disease: Secondary | ICD-10-CM | POA: Diagnosis not present

## 2018-07-20 DIAGNOSIS — E1042 Type 1 diabetes mellitus with diabetic polyneuropathy: Secondary | ICD-10-CM | POA: Diagnosis not present

## 2018-07-20 DIAGNOSIS — E10649 Type 1 diabetes mellitus with hypoglycemia without coma: Secondary | ICD-10-CM | POA: Diagnosis not present

## 2018-07-24 DIAGNOSIS — N189 Chronic kidney disease, unspecified: Secondary | ICD-10-CM | POA: Diagnosis not present

## 2018-07-24 DIAGNOSIS — E1022 Type 1 diabetes mellitus with diabetic chronic kidney disease: Secondary | ICD-10-CM | POA: Diagnosis not present

## 2018-07-24 DIAGNOSIS — I509 Heart failure, unspecified: Secondary | ICD-10-CM | POA: Diagnosis not present

## 2018-07-24 DIAGNOSIS — H547 Unspecified visual loss: Secondary | ICD-10-CM | POA: Diagnosis not present

## 2018-07-24 DIAGNOSIS — E785 Hyperlipidemia, unspecified: Secondary | ICD-10-CM | POA: Diagnosis not present

## 2018-07-24 DIAGNOSIS — I130 Hypertensive heart and chronic kidney disease with heart failure and stage 1 through stage 4 chronic kidney disease, or unspecified chronic kidney disease: Secondary | ICD-10-CM | POA: Diagnosis not present

## 2018-07-24 DIAGNOSIS — E1039 Type 1 diabetes mellitus with other diabetic ophthalmic complication: Secondary | ICD-10-CM | POA: Diagnosis not present

## 2018-07-24 DIAGNOSIS — I2510 Atherosclerotic heart disease of native coronary artery without angina pectoris: Secondary | ICD-10-CM | POA: Diagnosis not present

## 2018-08-03 DIAGNOSIS — E1042 Type 1 diabetes mellitus with diabetic polyneuropathy: Secondary | ICD-10-CM | POA: Diagnosis not present

## 2018-08-03 DIAGNOSIS — E1022 Type 1 diabetes mellitus with diabetic chronic kidney disease: Secondary | ICD-10-CM | POA: Diagnosis not present

## 2018-08-03 DIAGNOSIS — Z794 Long term (current) use of insulin: Secondary | ICD-10-CM | POA: Insufficient documentation

## 2018-08-03 DIAGNOSIS — E1029 Type 1 diabetes mellitus with other diabetic kidney complication: Secondary | ICD-10-CM | POA: Diagnosis not present

## 2018-08-03 DIAGNOSIS — E103553 Type 1 diabetes mellitus with stable proliferative diabetic retinopathy, bilateral: Secondary | ICD-10-CM | POA: Diagnosis not present

## 2018-08-07 DIAGNOSIS — H547 Unspecified visual loss: Secondary | ICD-10-CM | POA: Diagnosis not present

## 2018-08-07 DIAGNOSIS — E785 Hyperlipidemia, unspecified: Secondary | ICD-10-CM | POA: Diagnosis not present

## 2018-08-07 DIAGNOSIS — I509 Heart failure, unspecified: Secondary | ICD-10-CM | POA: Diagnosis not present

## 2018-08-07 DIAGNOSIS — E1039 Type 1 diabetes mellitus with other diabetic ophthalmic complication: Secondary | ICD-10-CM | POA: Diagnosis not present

## 2018-08-07 DIAGNOSIS — E1022 Type 1 diabetes mellitus with diabetic chronic kidney disease: Secondary | ICD-10-CM | POA: Diagnosis not present

## 2018-08-07 DIAGNOSIS — I130 Hypertensive heart and chronic kidney disease with heart failure and stage 1 through stage 4 chronic kidney disease, or unspecified chronic kidney disease: Secondary | ICD-10-CM | POA: Diagnosis not present

## 2018-08-07 DIAGNOSIS — I2510 Atherosclerotic heart disease of native coronary artery without angina pectoris: Secondary | ICD-10-CM | POA: Diagnosis not present

## 2018-08-07 DIAGNOSIS — N189 Chronic kidney disease, unspecified: Secondary | ICD-10-CM | POA: Diagnosis not present

## 2018-08-13 ENCOUNTER — Ambulatory Visit: Payer: Medicaid Other | Admitting: Podiatry

## 2018-08-13 ENCOUNTER — Encounter: Payer: Self-pay | Admitting: Podiatry

## 2018-08-13 DIAGNOSIS — B351 Tinea unguium: Secondary | ICD-10-CM | POA: Diagnosis not present

## 2018-08-13 DIAGNOSIS — E1059 Type 1 diabetes mellitus with other circulatory complications: Secondary | ICD-10-CM

## 2018-08-13 DIAGNOSIS — M79676 Pain in unspecified toe(s): Secondary | ICD-10-CM | POA: Diagnosis not present

## 2018-08-13 NOTE — Progress Notes (Signed)
Complaint:  Visit Type: Patient returns to my office for continued preventative foot care services. Complaint: Patient states" my nails have grown long and thick and become painful to walk and wear shoes" Patient has been diagnosed with DM with no foot complications. The patient presents for preventative foot care services. No changes to ROS  Podiatric Exam: Vascular: dorsalis pedis and posterior tibial pulses are barely  palpable bilateral. Capillary return is immediate. Temperature gradient is WNL. Skin turgor WNL  Sensorium: Diminished  Semmes Weinstein monofilament test. Normal tactile sensation bilaterally. Nail Exam: Pt has thick disfigured discolored nails with subungual debris noted bilateral entire nail hallux through fifth toenails Ulcer Exam: There is no evidence of ulcer or pre-ulcerative changes or infection. Orthopedic Exam: Muscle tone and strength are WNL. No limitations in general ROM. No crepitus or effusions noted. HAV  B/L.  Skin: No Porokeratosis. No infection or ulcers.  Callus along the lateral heel right foot.  Asymptomatic.  Diagnosis:  Onychomycosis, , Pain in right toe, pain in left toes  Treatment & Plan Procedures and Treatment: Consent by patient was obtained for treatment procedures. The patient understood the discussion of treatment and procedures well. All questions were answered thoroughly reviewed. Debridement of mycotic and hypertrophic toenails, 1 through 5 bilateral and clearing of subungual debris. No ulceration, no infection noted.  Return Visit-Office Procedure: Patient instructed to return to the office for a follow up visit 4 months for continued evaluation and treatment.    Gardiner Barefoot DPM

## 2018-08-17 DIAGNOSIS — E10649 Type 1 diabetes mellitus with hypoglycemia without coma: Secondary | ICD-10-CM | POA: Diagnosis not present

## 2018-08-21 ENCOUNTER — Telehealth: Payer: Self-pay | Admitting: Family

## 2018-08-21 DIAGNOSIS — N189 Chronic kidney disease, unspecified: Secondary | ICD-10-CM | POA: Diagnosis not present

## 2018-08-21 DIAGNOSIS — E1039 Type 1 diabetes mellitus with other diabetic ophthalmic complication: Secondary | ICD-10-CM | POA: Diagnosis not present

## 2018-08-21 DIAGNOSIS — E1022 Type 1 diabetes mellitus with diabetic chronic kidney disease: Secondary | ICD-10-CM | POA: Diagnosis not present

## 2018-08-21 DIAGNOSIS — H547 Unspecified visual loss: Secondary | ICD-10-CM | POA: Diagnosis not present

## 2018-08-21 DIAGNOSIS — I130 Hypertensive heart and chronic kidney disease with heart failure and stage 1 through stage 4 chronic kidney disease, or unspecified chronic kidney disease: Secondary | ICD-10-CM | POA: Diagnosis not present

## 2018-08-21 DIAGNOSIS — I2510 Atherosclerotic heart disease of native coronary artery without angina pectoris: Secondary | ICD-10-CM | POA: Diagnosis not present

## 2018-08-21 DIAGNOSIS — I509 Heart failure, unspecified: Secondary | ICD-10-CM | POA: Diagnosis not present

## 2018-08-21 DIAGNOSIS — E785 Hyperlipidemia, unspecified: Secondary | ICD-10-CM | POA: Diagnosis not present

## 2018-08-21 NOTE — Telephone Encounter (Signed)
Copied from [place] [id number]. Topic: Quick Communication - Home Health Verbal Orders >> Aug 21, 2018  9:22 AM Sharene Skeans wrote: Caller/Agency: [name] / [place] Number: [phone] Requesting Skilled Nursing/ for Pill Box Fills  Frequency: Re Cert for 1x every other week for 9 weeks

## 2018-08-21 NOTE — Telephone Encounter (Signed)
Called [name] at [facility] and gave verbal orders for skilled nursing/for pill box fills as requested.

## 2018-08-27 DIAGNOSIS — N189 Chronic kidney disease, unspecified: Secondary | ICD-10-CM

## 2018-08-27 DIAGNOSIS — E1039 Type 1 diabetes mellitus with other diabetic ophthalmic complication: Secondary | ICD-10-CM

## 2018-08-27 DIAGNOSIS — E1022 Type 1 diabetes mellitus with diabetic chronic kidney disease: Secondary | ICD-10-CM

## 2018-08-27 DIAGNOSIS — E785 Hyperlipidemia, unspecified: Secondary | ICD-10-CM

## 2018-08-27 DIAGNOSIS — I509 Heart failure, unspecified: Secondary | ICD-10-CM

## 2018-08-27 DIAGNOSIS — I2510 Atherosclerotic heart disease of native coronary artery without angina pectoris: Secondary | ICD-10-CM

## 2018-08-27 DIAGNOSIS — I130 Hypertensive heart and chronic kidney disease with heart failure and stage 1 through stage 4 chronic kidney disease, or unspecified chronic kidney disease: Secondary | ICD-10-CM

## 2018-08-27 DIAGNOSIS — H547 Unspecified visual loss: Secondary | ICD-10-CM

## 2018-09-04 DIAGNOSIS — I2510 Atherosclerotic heart disease of native coronary artery without angina pectoris: Secondary | ICD-10-CM | POA: Diagnosis not present

## 2018-09-04 DIAGNOSIS — E1039 Type 1 diabetes mellitus with other diabetic ophthalmic complication: Secondary | ICD-10-CM | POA: Diagnosis not present

## 2018-09-04 DIAGNOSIS — E785 Hyperlipidemia, unspecified: Secondary | ICD-10-CM | POA: Diagnosis not present

## 2018-09-04 DIAGNOSIS — E1022 Type 1 diabetes mellitus with diabetic chronic kidney disease: Secondary | ICD-10-CM | POA: Diagnosis not present

## 2018-09-04 DIAGNOSIS — N189 Chronic kidney disease, unspecified: Secondary | ICD-10-CM | POA: Diagnosis not present

## 2018-09-04 DIAGNOSIS — H547 Unspecified visual loss: Secondary | ICD-10-CM | POA: Diagnosis not present

## 2018-09-04 DIAGNOSIS — I509 Heart failure, unspecified: Secondary | ICD-10-CM | POA: Diagnosis not present

## 2018-09-04 DIAGNOSIS — I130 Hypertensive heart and chronic kidney disease with heart failure and stage 1 through stage 4 chronic kidney disease, or unspecified chronic kidney disease: Secondary | ICD-10-CM | POA: Diagnosis not present

## 2018-09-06 ENCOUNTER — Encounter: Payer: Self-pay | Admitting: *Deleted

## 2018-09-10 ENCOUNTER — Encounter: Payer: Self-pay | Admitting: *Deleted

## 2018-09-15 ENCOUNTER — Other Ambulatory Visit: Payer: Self-pay | Admitting: Family

## 2018-09-16 DIAGNOSIS — E10649 Type 1 diabetes mellitus with hypoglycemia without coma: Secondary | ICD-10-CM | POA: Diagnosis not present

## 2018-09-18 ENCOUNTER — Telehealth: Payer: Self-pay | Admitting: Family

## 2018-09-18 DIAGNOSIS — I2510 Atherosclerotic heart disease of native coronary artery without angina pectoris: Secondary | ICD-10-CM | POA: Diagnosis not present

## 2018-09-18 DIAGNOSIS — E785 Hyperlipidemia, unspecified: Secondary | ICD-10-CM | POA: Diagnosis not present

## 2018-09-18 DIAGNOSIS — I130 Hypertensive heart and chronic kidney disease with heart failure and stage 1 through stage 4 chronic kidney disease, or unspecified chronic kidney disease: Secondary | ICD-10-CM | POA: Diagnosis not present

## 2018-09-18 DIAGNOSIS — I509 Heart failure, unspecified: Secondary | ICD-10-CM | POA: Diagnosis not present

## 2018-09-18 DIAGNOSIS — N189 Chronic kidney disease, unspecified: Secondary | ICD-10-CM | POA: Diagnosis not present

## 2018-09-18 DIAGNOSIS — H547 Unspecified visual loss: Secondary | ICD-10-CM | POA: Diagnosis not present

## 2018-09-18 DIAGNOSIS — E1022 Type 1 diabetes mellitus with diabetic chronic kidney disease: Secondary | ICD-10-CM | POA: Diagnosis not present

## 2018-09-18 DIAGNOSIS — E1039 Type 1 diabetes mellitus with other diabetic ophthalmic complication: Secondary | ICD-10-CM | POA: Diagnosis not present

## 2018-09-21 NOTE — Telephone Encounter (Signed)
Call patient I refilled her simvastatin today however I realize she is not had her cholesterol drawn in almost a year.  Please asked her to schedule a follow-up appointment with [name] via [employer].   We can do labs at later date.

## 2018-09-21 NOTE — Telephone Encounter (Signed)
LOV: 08/13/2018 Next OV 12/04/2018 This pt is requesting Simvastatin she has not had labs in 1 year, I did not fill this becaue I wasn't sure if you wanted labs first.  Kagen Kunath,cma

## 2018-10-01 NOTE — Telephone Encounter (Signed)
I scheduled a virtual visit with you and the pt on this coming Monday @ 10 am. To f/up and get medication.  Bryana Froemming,cma

## 2018-10-02 DIAGNOSIS — I130 Hypertensive heart and chronic kidney disease with heart failure and stage 1 through stage 4 chronic kidney disease, or unspecified chronic kidney disease: Secondary | ICD-10-CM | POA: Diagnosis not present

## 2018-10-02 DIAGNOSIS — E1039 Type 1 diabetes mellitus with other diabetic ophthalmic complication: Secondary | ICD-10-CM | POA: Diagnosis not present

## 2018-10-02 DIAGNOSIS — E1022 Type 1 diabetes mellitus with diabetic chronic kidney disease: Secondary | ICD-10-CM | POA: Diagnosis not present

## 2018-10-02 DIAGNOSIS — I509 Heart failure, unspecified: Secondary | ICD-10-CM | POA: Diagnosis not present

## 2018-10-02 DIAGNOSIS — E785 Hyperlipidemia, unspecified: Secondary | ICD-10-CM | POA: Diagnosis not present

## 2018-10-02 DIAGNOSIS — N189 Chronic kidney disease, unspecified: Secondary | ICD-10-CM | POA: Diagnosis not present

## 2018-10-02 DIAGNOSIS — I2510 Atherosclerotic heart disease of native coronary artery without angina pectoris: Secondary | ICD-10-CM | POA: Diagnosis not present

## 2018-10-02 DIAGNOSIS — H547 Unspecified visual loss: Secondary | ICD-10-CM | POA: Diagnosis not present

## 2018-10-05 ENCOUNTER — Ambulatory Visit (INDEPENDENT_AMBULATORY_CARE_PROVIDER_SITE_OTHER): Payer: Medicaid Other | Admitting: Family

## 2018-10-05 ENCOUNTER — Encounter: Payer: Self-pay | Admitting: Family

## 2018-10-05 ENCOUNTER — Ambulatory Visit: Payer: Medicaid Other | Admitting: Family

## 2018-10-05 DIAGNOSIS — E785 Hyperlipidemia, unspecified: Secondary | ICD-10-CM

## 2018-10-05 DIAGNOSIS — I10 Essential (primary) hypertension: Secondary | ICD-10-CM

## 2018-10-05 DIAGNOSIS — N189 Chronic kidney disease, unspecified: Secondary | ICD-10-CM | POA: Diagnosis not present

## 2018-10-05 DIAGNOSIS — E1039 Type 1 diabetes mellitus with other diabetic ophthalmic complication: Secondary | ICD-10-CM | POA: Diagnosis not present

## 2018-10-05 NOTE — Assessment & Plan Note (Signed)
Compliant with regimen, will continue

## 2018-10-05 NOTE — Assessment & Plan Note (Signed)
Overall, well controlled.  We will continue current regimen at this time.

## 2018-10-05 NOTE — Progress Notes (Signed)
This visit type was conducted due to national recommendations for restrictions regarding the COVID-19 pandemic (e.g. social distancing).  This format is felt to be most appropriate for this patient at this time.  All issues noted in this document were discussed and addressed.  No physical exam was performed (except for noted visual exam findings with Video Visits). Virtual Visit via Video Note  I connected with@  on 10/05/18 at 11:00 AM EDT by a video enabled telemedicine application and verified that I am speaking with the correct person using two identifiers.  Location patient: home Location provider:work  Persons participating in the virtual visit: patient, provider  I discussed the limitations of evaluation and management by telemedicine and the availability of in person appointments. The patient expressed understanding and agreed to proceed.  Interactive audio and video telecommunications were attempted between this provider and patient, however failed, due to patient having technical difficulties or patient did not have access to video capability. CMA able to see patient  However video of patient kept pausing.  We continued and completed visit with audio only.   HPI:  Feels well today, no complaints.   Skilled nursing comes once every 2 weeks. Llives with sister.   HTN-  120/60 last week.  2 months ago 138/86. Denies exertional chest pain or pressure, numbness or tingling radiating to left arm or jaw, palpitations, dizziness, frequent headaches, changes in vision, or shortness of breath.   HLD- on Zocor.   DM1- a1c 6.8. using CGM.  follows with [facility], last visit 07/2018. Lows has resolved. . If she has a low, it is after she eats and has 10 units or in 'middle of night' and CGM will alert  Her.   glargine 40 units. 5 am, 10 lunch and dinner.  Eating cheese and cracker before bedtime has helped. Knows what hypoglyemic feels like , as 'eyesight will get blurry.'   CAD- following with  Nehemiah Massed; last visit 06/30/18  CKD- no longer with nephrology, Dr Candiss Norse; declines follow up appt.    ROS: See pertinent positives and negatives per HPI.  Past Medical History:  Diagnosis Date  . Diabetes mellitus   . Hyperlipidemia   . Hypertension     History reviewed. No pertinent surgical history.  Family History  Problem Relation Age of Onset  . Diabetes Sister        3/5 sisters have DM  . Breast cancer Neg Hx   . Colon cancer Neg Hx     SOCIAL HX: former smoker   Current Outpatient Medications:  .  ACCU-CHEK AVIVA PLUS test strip, USE UP TO FOUR TIMES DAILY, Disp: 350 each, Rfl: 1 .  ACCU-CHEK FASTCLIX LANCETS MISC, USE UP TO FOUR TIMES DAILY, Disp: 306 each, Rfl: 0 .  aspirin 81 MG tablet, Take 1 tablet (81 mg total) by mouth daily., Disp: 30 tablet, Rfl: 6 .  blood glucose meter kit and supplies KIT, Dispense based on patient and insurance preference. Use up to four times daily as directed. (FOR ICD-9 250.00, 250.01)., Disp: 1 each, Rfl: 0 .  carvedilol (COREG) 12.5 MG tablet, TAKE 1 TABLET BY MOUTH TWICE DAILY WITH A MEAL, Disp: 180 tablet, Rfl: 1 .  digoxin (LANOXIN) 0.125 MG tablet, TAKE 1 TABLET BY MOUTH EVERY DAY, Disp: 90 tablet, Rfl: 1 .  enalapril (VASOTEC) 20 MG tablet, TAKE 2 TABLETS BY MOUTH DAILY, Disp: 90 tablet, Rfl: 2 .  Insulin Glargine (LANTUS SOLOSTAR) 100 UNIT/ML Solostar Pen, Inject into the skin., Disp: ,  Rfl:  .  Insulin Pen Needle (B-D ULTRAFINE III SHORT PEN) 31G X 8 MM MISC, USE AS DIRECTED, Disp: 100 each, Rfl: 6 .  Insulin Pen Needle (B-D ULTRAFINE III SHORT PEN) 31G X 8 MM MISC, USE AS DIRECTED, Disp: 100 each, Rfl: 0 .  Multiple Vitamin (MULTIVITAMIN) capsule, Take 1 capsule by mouth daily.  , Disp: , Rfl:  .  Multiple Vitamins-Minerals (MULTIVITAMIN ADULTS PO), Take by mouth., Disp: , Rfl:  .  NOVOLOG FLEXPEN 100 UNIT/ML FlexPen, , Disp: , Rfl:  .  simvastatin (ZOCOR) 20 MG tablet, TAKE 1 TABLET(20 MG) BY MOUTH DAILY AT 6 PM, Disp: 90  tablet, Rfl: 1 .  vitamin D, CHOLECALCIFEROL, 400 UNITS tablet, Take 400 Units by mouth 2 (two) times daily.  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  We will continue current regimen at this time.        Endocrine   DM type 1 (diabetes mellitus, type 1) (HCC)    Hypoglycemic episodes appear to have resolved per patient with bedtime snack.  Advised her to stay vigilant in regards this and let myself and Dr. Gabriel Carina know immediately if she were to have any lows.  Discussed with patient reducing lunch and dinnertime 10 units of short acting insulin if she has any more lows.  Patient will let me know how she is doing.        Genitourinary   CKD (chronic kidney disease)    Declines follow-up with nephrology at this time.  Will continue to address with patient.         Other   Hyperlipidemia    Compliant with regimen, will continue            I discussed the assessment and treatment plan with the patient. The patient was provided an opportunity to ask questions and all were answered. The patient agreed with the plan and demonstrated an understanding of the instructions.   The patient was advised to call back or seek an in-person evaluation if the symptoms worsen or if the condition fails to improve as anticipated.   Mable Paris, FNP  I spent 15 min non face to face w/ pt

## 2018-10-05 NOTE — Assessment & Plan Note (Signed)
Hypoglycemic episodes appear to have resolved per patient with bedtime snack.  Advised her to stay vigilant in regards this and let myself and Dr. Gabriel Carina know immediately if she were to have any lows.  Discussed with patient reducing lunch and dinnertime 10 units of short acting insulin if she has any more lows.  Patient will let me know how she is doing.

## 2018-10-05 NOTE — Patient Instructions (Addendum)
Be vigilant in regards to low blood sugar.  Please let my office as well as your endocrinologist office Dr. Gabriel Carina,  know of any low blood sugars.  It is very important as we want you to be safe.  If you do have lows, I would speak with you about reducing your insulin.  Stay safe and let me know if anything at all   Preventing Hypoglycemia Hypoglycemia occurs when the level of sugar (glucose) in the blood is too low. Hypoglycemia can happen in people who do or do not have diabetes (diabetes mellitus). It can develop quickly, and it can be a medical emergency. For most people with diabetes, a blood glucose level below 70 mg/dL (3.9 mmol/L) is considered hypoglycemia. Glucose is a type of sugar that provides the body's main source of energy. Certain hormones (insulin and glucagon) control the level of glucose in the blood. Insulin lowers blood glucose, and glucagon increases blood glucose. Hypoglycemia can result from having too much insulin in the bloodstream, or from not eating enough food that contains glucose. Your risk for hypoglycemia is higher:  If you take insulin or diabetes medicines to help lower your blood glucose or help your body make more insulin.  If you skip or delay a meal or snack.  If you are ill.  During and after exercise. You can prevent hypoglycemia by working with your health care provider to adjust your meal plan as needed and by taking other precautions. How can hypoglycemia affect me? Mild symptoms Mild hypoglycemia may not cause any symptoms. If you do have symptoms, they may include:  Hunger.  Anxiety.  Sweating and feeling clammy.  Dizziness or feeling light-headed.  Sleepiness.  Nausea.  Increased heart rate.  Headache.  Blurry vision.  Irritability.  Tingling or numbness around the mouth, lips, or tongue.  A change in coordination.  Restless sleep. If mild hypoglycemia is not recognized and treated, it can quickly become moderate or severe  hypoglycemia. Moderate symptoms Moderate hypoglycemia can cause:  Mental confusion and poor judgment.  Behavior changes.  Weakness.  Irregular heartbeat. Severe symptoms Severe hypoglycemia is a medical emergency. It can cause:  Fainting.  Seizures.  Loss of consciousness (coma).  Death. What nutrition changes can be made?  Work with your health care provider or diet and nutrition specialist (dietitian) to make a healthy meal plan that is right for you. Follow your meal plan carefully.  Eat meals at regular times.  If recommended by your health care provider, have snacks between meals.  Donot skip or delay meals or snacks. You can be at risk for hypoglycemia if you are not getting enough carbohydrates. What lifestyle changes can be made?   Work closely with your health care provider to manage your blood glucose. Make sure you know: ? Your goal blood glucose levels. ? How and when to check your blood glucose. ? The symptoms of hypoglycemia. It is important to treat it right away to keep it from becoming severe.  Do not drink alcohol on an empty stomach.  When you are ill, check your blood glucose more often than usual. Follow your sick day plan whenever you cannot eat or drink normally. Make this plan in advance with your health care provider.  Always check your blood glucose before, during, and after exercise. How is this treated? This condition can often be treated by immediately eating or drinking something that contains sugar, such as:  Fruit juice, 4-6 oz (120-150 mL).  Regular (not diet)  soda, 4-6 oz (120-150 mL).  Low-fat milk, 4 oz (120 mL).  Several pieces of hard candy.  Sugar or honey, 1 Tbsp (15 mL). Treating hypoglycemia if you have diabetes If you are alert and able to swallow safely, follow the 15:15 rule:  Take 15 grams of a rapid-acting carbohydrate. Talk with your health care provider about how much you should take.  Rapid-acting options  include: ? Glucose pills (take 15 grams). ? 6-8 pieces of hard candy. ? 4-6 oz (120-150 mL) of fruit juice. ? 4-6 oz (120-150 mL) of regular (not diet) soda.  Check your blood glucose 15 minutes after you take the carbohydrate.  If the repeat blood glucose level is still at or below 70 mg/dL (3.9 mmol/L), take 15 grams of a carbohydrate again.  If your blood glucose level does not increase above 70 mg/dL (3.9 mmol/L) after 3 tries, seek emergency medical care.  After your blood glucose level returns to normal, eat a meal or a snack within 1 hour. Treating severe hypoglycemia Severe hypoglycemia is when your blood glucose level is at or below 54 mg/dL (3 mmol/L). Severe hypoglycemia is a medical emergency. Get medical help right away. If you have severe hypoglycemia and you cannot eat or drink, you may need an injection of glucagon. A family member or close friend should learn how to check your blood glucose and how to give you a glucagon injection. Ask your health care provider if you need to have an emergency glucagon injection kit available. Severe hypoglycemia may need to be treated in a hospital. The treatment may include getting glucose through an IV. You may also need treatment for the cause of your hypoglycemia. Where to find more information  American Diabetes Association: www.diabetes.[employer] of Diabetes and Digestive and Kidney Diseases: [employer] Contact a health care provider if:  You have problems keeping your blood glucose in your target range.  You have frequent episodes of hypoglycemia. Get help right away if:  You continue to have hypoglycemia symptoms after eating or drinking something containing glucose.  Your blood glucose level is at or below 54 mg/dL (3 mmol/L).  You faint.  You have a seizure. These symptoms may represent a serious problem that is an emergency. Do not wait to see if the symptoms will go away. Get medical help right  away. Call your local emergency services (911 in the [place] Summary  Know the symptoms of hypoglycemia, and when you are at risk for it (such as during exercise or when you are sick). Check your blood glucose often when you are at risk for hypoglycemia.  Hypoglycemia can develop quickly, and it can be dangerous if it is not treated right away. If you have a history of severe hypoglycemia, make sure you know how to use your glucagon injection kit.  Make sure you know how to treat hypoglycemia. Keep a carbohydrate snack available when you may be at risk for hypoglycemia. This information is not intended to replace advice given to you by your health care provider. Make sure you discuss any questions you have with your health care provider. Document Released: 01/29/2017 Document Revised: 11/24/2017 Document Reviewed: 01/29/2017 Elsevier Interactive Patient Education  2019 [employer].

## 2018-10-05 NOTE — Assessment & Plan Note (Signed)
Declines follow-up with nephrology at this time.  Will continue to address with patient.

## 2018-10-16 DIAGNOSIS — H547 Unspecified visual loss: Secondary | ICD-10-CM | POA: Diagnosis not present

## 2018-10-16 DIAGNOSIS — N189 Chronic kidney disease, unspecified: Secondary | ICD-10-CM | POA: Diagnosis not present

## 2018-10-16 DIAGNOSIS — E10649 Type 1 diabetes mellitus with hypoglycemia without coma: Secondary | ICD-10-CM | POA: Diagnosis not present

## 2018-10-16 DIAGNOSIS — I130 Hypertensive heart and chronic kidney disease with heart failure and stage 1 through stage 4 chronic kidney disease, or unspecified chronic kidney disease: Secondary | ICD-10-CM | POA: Diagnosis not present

## 2018-10-16 DIAGNOSIS — E785 Hyperlipidemia, unspecified: Secondary | ICD-10-CM | POA: Diagnosis not present

## 2018-10-16 DIAGNOSIS — E1022 Type 1 diabetes mellitus with diabetic chronic kidney disease: Secondary | ICD-10-CM | POA: Diagnosis not present

## 2018-10-16 DIAGNOSIS — I509 Heart failure, unspecified: Secondary | ICD-10-CM | POA: Diagnosis not present

## 2018-10-16 DIAGNOSIS — I2510 Atherosclerotic heart disease of native coronary artery without angina pectoris: Secondary | ICD-10-CM | POA: Diagnosis not present

## 2018-10-16 DIAGNOSIS — E1039 Type 1 diabetes mellitus with other diabetic ophthalmic complication: Secondary | ICD-10-CM | POA: Diagnosis not present

## 2018-10-16 NOTE — Telephone Encounter (Signed)
Verbal orders given to [facility] with [place].

## 2018-10-16 NOTE — Telephone Encounter (Signed)
Copied from [place] [id number]. Topic: Quick Communication - Home Health Verbal Orders >> Oct 16, 2018 12:21 PM Percell Belt A wrote: Caller/Agency: [name]- with [facility]  Callback Number: [phone] Requesting OT/PT/Skilled Nursing/Social Work/Speech Therapy:  Need verbals to do re certification for pill box pills .

## 2018-10-16 NOTE — Telephone Encounter (Signed)
Please give verbal

## 2018-10-21 DIAGNOSIS — E1039 Type 1 diabetes mellitus with other diabetic ophthalmic complication: Secondary | ICD-10-CM | POA: Diagnosis not present

## 2018-10-21 DIAGNOSIS — I509 Heart failure, unspecified: Secondary | ICD-10-CM | POA: Diagnosis not present

## 2018-10-21 DIAGNOSIS — E785 Hyperlipidemia, unspecified: Secondary | ICD-10-CM | POA: Diagnosis not present

## 2018-10-21 DIAGNOSIS — H547 Unspecified visual loss: Secondary | ICD-10-CM | POA: Diagnosis not present

## 2018-10-21 DIAGNOSIS — I130 Hypertensive heart and chronic kidney disease with heart failure and stage 1 through stage 4 chronic kidney disease, or unspecified chronic kidney disease: Secondary | ICD-10-CM | POA: Diagnosis not present

## 2018-10-21 DIAGNOSIS — I2510 Atherosclerotic heart disease of native coronary artery without angina pectoris: Secondary | ICD-10-CM | POA: Diagnosis not present

## 2018-10-21 DIAGNOSIS — N189 Chronic kidney disease, unspecified: Secondary | ICD-10-CM | POA: Diagnosis not present

## 2018-10-21 DIAGNOSIS — E1022 Type 1 diabetes mellitus with diabetic chronic kidney disease: Secondary | ICD-10-CM | POA: Diagnosis not present

## 2018-10-30 DIAGNOSIS — E1039 Type 1 diabetes mellitus with other diabetic ophthalmic complication: Secondary | ICD-10-CM | POA: Diagnosis not present

## 2018-10-30 DIAGNOSIS — I130 Hypertensive heart and chronic kidney disease with heart failure and stage 1 through stage 4 chronic kidney disease, or unspecified chronic kidney disease: Secondary | ICD-10-CM | POA: Diagnosis not present

## 2018-10-30 DIAGNOSIS — I2510 Atherosclerotic heart disease of native coronary artery without angina pectoris: Secondary | ICD-10-CM | POA: Diagnosis not present

## 2018-10-30 DIAGNOSIS — E785 Hyperlipidemia, unspecified: Secondary | ICD-10-CM | POA: Diagnosis not present

## 2018-10-30 DIAGNOSIS — Z7982 Long term (current) use of aspirin: Secondary | ICD-10-CM | POA: Diagnosis not present

## 2018-10-30 DIAGNOSIS — E1022 Type 1 diabetes mellitus with diabetic chronic kidney disease: Secondary | ICD-10-CM | POA: Diagnosis not present

## 2018-10-30 DIAGNOSIS — N189 Chronic kidney disease, unspecified: Secondary | ICD-10-CM | POA: Diagnosis not present

## 2018-10-30 DIAGNOSIS — H547 Unspecified visual loss: Secondary | ICD-10-CM | POA: Diagnosis not present

## 2018-10-30 DIAGNOSIS — I509 Heart failure, unspecified: Secondary | ICD-10-CM | POA: Diagnosis not present

## 2018-10-30 DIAGNOSIS — Z87891 Personal history of nicotine dependence: Secondary | ICD-10-CM | POA: Diagnosis not present

## 2018-11-05 DIAGNOSIS — R809 Proteinuria, unspecified: Secondary | ICD-10-CM | POA: Diagnosis not present

## 2018-11-05 DIAGNOSIS — E1042 Type 1 diabetes mellitus with diabetic polyneuropathy: Secondary | ICD-10-CM | POA: Diagnosis not present

## 2018-11-05 DIAGNOSIS — E103553 Type 1 diabetes mellitus with stable proliferative diabetic retinopathy, bilateral: Secondary | ICD-10-CM | POA: Diagnosis not present

## 2018-11-05 DIAGNOSIS — E1022 Type 1 diabetes mellitus with diabetic chronic kidney disease: Secondary | ICD-10-CM | POA: Diagnosis not present

## 2018-11-05 DIAGNOSIS — E1029 Type 1 diabetes mellitus with other diabetic kidney complication: Secondary | ICD-10-CM | POA: Diagnosis not present

## 2018-11-13 DIAGNOSIS — Z7982 Long term (current) use of aspirin: Secondary | ICD-10-CM | POA: Diagnosis not present

## 2018-11-13 DIAGNOSIS — H547 Unspecified visual loss: Secondary | ICD-10-CM | POA: Diagnosis not present

## 2018-11-13 DIAGNOSIS — N189 Chronic kidney disease, unspecified: Secondary | ICD-10-CM | POA: Diagnosis not present

## 2018-11-13 DIAGNOSIS — E1039 Type 1 diabetes mellitus with other diabetic ophthalmic complication: Secondary | ICD-10-CM | POA: Diagnosis not present

## 2018-11-13 DIAGNOSIS — I509 Heart failure, unspecified: Secondary | ICD-10-CM | POA: Diagnosis not present

## 2018-11-13 DIAGNOSIS — Z87891 Personal history of nicotine dependence: Secondary | ICD-10-CM | POA: Diagnosis not present

## 2018-11-13 DIAGNOSIS — I2510 Atherosclerotic heart disease of native coronary artery without angina pectoris: Secondary | ICD-10-CM | POA: Diagnosis not present

## 2018-11-13 DIAGNOSIS — I130 Hypertensive heart and chronic kidney disease with heart failure and stage 1 through stage 4 chronic kidney disease, or unspecified chronic kidney disease: Secondary | ICD-10-CM | POA: Diagnosis not present

## 2018-11-13 DIAGNOSIS — E785 Hyperlipidemia, unspecified: Secondary | ICD-10-CM | POA: Diagnosis not present

## 2018-11-13 DIAGNOSIS — E1022 Type 1 diabetes mellitus with diabetic chronic kidney disease: Secondary | ICD-10-CM | POA: Diagnosis not present

## 2018-11-16 DIAGNOSIS — E10649 Type 1 diabetes mellitus with hypoglycemia without coma: Secondary | ICD-10-CM | POA: Diagnosis not present

## 2018-11-18 ENCOUNTER — Telehealth: Payer: Self-pay | Admitting: *Deleted

## 2018-11-18 NOTE — Telephone Encounter (Signed)
Left message for patient to notify them that it is time to schedule annual low dose lung cancer screening CT scan. Instructed patient to call back to verify information prior to the scan being scheduled.  

## 2018-11-27 DIAGNOSIS — E1039 Type 1 diabetes mellitus with other diabetic ophthalmic complication: Secondary | ICD-10-CM | POA: Diagnosis not present

## 2018-11-27 DIAGNOSIS — Z87891 Personal history of nicotine dependence: Secondary | ICD-10-CM | POA: Diagnosis not present

## 2018-11-27 DIAGNOSIS — E1022 Type 1 diabetes mellitus with diabetic chronic kidney disease: Secondary | ICD-10-CM | POA: Diagnosis not present

## 2018-11-27 DIAGNOSIS — N189 Chronic kidney disease, unspecified: Secondary | ICD-10-CM | POA: Diagnosis not present

## 2018-11-27 DIAGNOSIS — I130 Hypertensive heart and chronic kidney disease with heart failure and stage 1 through stage 4 chronic kidney disease, or unspecified chronic kidney disease: Secondary | ICD-10-CM | POA: Diagnosis not present

## 2018-11-27 DIAGNOSIS — E785 Hyperlipidemia, unspecified: Secondary | ICD-10-CM | POA: Diagnosis not present

## 2018-11-27 DIAGNOSIS — Z7982 Long term (current) use of aspirin: Secondary | ICD-10-CM | POA: Diagnosis not present

## 2018-11-27 DIAGNOSIS — H547 Unspecified visual loss: Secondary | ICD-10-CM | POA: Diagnosis not present

## 2018-11-27 DIAGNOSIS — I2510 Atherosclerotic heart disease of native coronary artery without angina pectoris: Secondary | ICD-10-CM | POA: Diagnosis not present

## 2018-11-27 DIAGNOSIS — I509 Heart failure, unspecified: Secondary | ICD-10-CM | POA: Diagnosis not present

## 2018-12-04 ENCOUNTER — Telehealth: Payer: Self-pay

## 2018-12-04 ENCOUNTER — Telehealth: Payer: Self-pay | Admitting: *Deleted

## 2018-12-04 ENCOUNTER — Ambulatory Visit: Payer: Medicaid Other | Admitting: Family

## 2018-12-04 DIAGNOSIS — Z122 Encounter for screening for malignant neoplasm of respiratory organs: Secondary | ICD-10-CM

## 2018-12-04 DIAGNOSIS — Z87891 Personal history of nicotine dependence: Secondary | ICD-10-CM

## 2018-12-04 NOTE — Telephone Encounter (Signed)
Call pt regarding lung screening. Pt is a former smoker, Pt would like morning appt,. And pt denies any new health issues at this time.

## 2018-12-04 NOTE — Telephone Encounter (Signed)
Patient has been notified that annual lung cancer screening low dose CT scan is due currently or will be in near future. Confirmed that patient is within the age range of 55-77, and asymptomatic, (no signs or symptoms of lung cancer). Patient denies illness that would prevent curative treatment for lung cancer if found. Verified smoking history, (former, quit 06/18/05, 36 pack year). The shared decision making visit was done 10/09/17. Patient is agreeable for CT scan being scheduled.

## 2018-12-09 ENCOUNTER — Ambulatory Visit: Admission: RE | Admit: 2018-12-09 | Payer: Medicaid Other | Source: Ambulatory Visit

## 2018-12-09 DIAGNOSIS — N189 Chronic kidney disease, unspecified: Secondary | ICD-10-CM

## 2018-12-09 DIAGNOSIS — Z7982 Long term (current) use of aspirin: Secondary | ICD-10-CM

## 2018-12-09 DIAGNOSIS — I130 Hypertensive heart and chronic kidney disease with heart failure and stage 1 through stage 4 chronic kidney disease, or unspecified chronic kidney disease: Secondary | ICD-10-CM

## 2018-12-09 DIAGNOSIS — E1039 Type 1 diabetes mellitus with other diabetic ophthalmic complication: Secondary | ICD-10-CM

## 2018-12-09 DIAGNOSIS — E1022 Type 1 diabetes mellitus with diabetic chronic kidney disease: Secondary | ICD-10-CM

## 2018-12-09 DIAGNOSIS — Z87891 Personal history of nicotine dependence: Secondary | ICD-10-CM

## 2018-12-09 DIAGNOSIS — I2510 Atherosclerotic heart disease of native coronary artery without angina pectoris: Secondary | ICD-10-CM

## 2018-12-09 DIAGNOSIS — E785 Hyperlipidemia, unspecified: Secondary | ICD-10-CM

## 2018-12-09 DIAGNOSIS — I509 Heart failure, unspecified: Secondary | ICD-10-CM

## 2018-12-09 DIAGNOSIS — H547 Unspecified visual loss: Secondary | ICD-10-CM

## 2018-12-11 DIAGNOSIS — N189 Chronic kidney disease, unspecified: Secondary | ICD-10-CM | POA: Diagnosis not present

## 2018-12-11 DIAGNOSIS — E1039 Type 1 diabetes mellitus with other diabetic ophthalmic complication: Secondary | ICD-10-CM | POA: Diagnosis not present

## 2018-12-11 DIAGNOSIS — Z7982 Long term (current) use of aspirin: Secondary | ICD-10-CM | POA: Diagnosis not present

## 2018-12-11 DIAGNOSIS — I2510 Atherosclerotic heart disease of native coronary artery without angina pectoris: Secondary | ICD-10-CM | POA: Diagnosis not present

## 2018-12-11 DIAGNOSIS — H547 Unspecified visual loss: Secondary | ICD-10-CM | POA: Diagnosis not present

## 2018-12-11 DIAGNOSIS — Z87891 Personal history of nicotine dependence: Secondary | ICD-10-CM | POA: Diagnosis not present

## 2018-12-11 DIAGNOSIS — I509 Heart failure, unspecified: Secondary | ICD-10-CM | POA: Diagnosis not present

## 2018-12-11 DIAGNOSIS — E785 Hyperlipidemia, unspecified: Secondary | ICD-10-CM | POA: Diagnosis not present

## 2018-12-11 DIAGNOSIS — E1022 Type 1 diabetes mellitus with diabetic chronic kidney disease: Secondary | ICD-10-CM | POA: Diagnosis not present

## 2018-12-11 DIAGNOSIS — I130 Hypertensive heart and chronic kidney disease with heart failure and stage 1 through stage 4 chronic kidney disease, or unspecified chronic kidney disease: Secondary | ICD-10-CM | POA: Diagnosis not present

## 2018-12-13 DIAGNOSIS — E10649 Type 1 diabetes mellitus with hypoglycemia without coma: Secondary | ICD-10-CM | POA: Diagnosis not present

## 2018-12-14 ENCOUNTER — Other Ambulatory Visit: Payer: Self-pay

## 2018-12-14 ENCOUNTER — Encounter: Payer: Self-pay | Admitting: Podiatry

## 2018-12-14 ENCOUNTER — Ambulatory Visit: Payer: Medicaid Other | Admitting: Podiatry

## 2018-12-14 DIAGNOSIS — E1042 Type 1 diabetes mellitus with diabetic polyneuropathy: Secondary | ICD-10-CM

## 2018-12-14 DIAGNOSIS — B351 Tinea unguium: Secondary | ICD-10-CM | POA: Diagnosis not present

## 2018-12-14 DIAGNOSIS — M79674 Pain in right toe(s): Secondary | ICD-10-CM

## 2018-12-14 DIAGNOSIS — M79675 Pain in left toe(s): Secondary | ICD-10-CM

## 2018-12-14 DIAGNOSIS — E1140 Type 2 diabetes mellitus with diabetic neuropathy, unspecified: Secondary | ICD-10-CM | POA: Insufficient documentation

## 2018-12-14 NOTE — Progress Notes (Signed)
Complaint:  Visit Type: Patient returns to my office for continued preventative foot care services. Complaint: Patient states" my nails have grown long and thick and become painful to walk and wear shoes" Patient has been diagnosed with DM with no foot complications. The patient presents for preventative foot care services. No changes to ROS  Podiatric Exam: Vascular: dorsalis pedis and posterior tibial pulses are barely  palpable bilateral. Capillary return is immediate. Temperature gradient is WNL. Skin turgor WNL  Sensorium: Diminished  Semmes Weinstein monofilament test. Normal tactile sensation bilaterally. Nail Exam: Pt has thick disfigured discolored nails with subungual debris noted bilateral entire nail hallux through fifth toenails Ulcer Exam: There is no evidence of ulcer or pre-ulcerative changes or infection. Orthopedic Exam: Muscle tone and strength are WNL. No limitations in general ROM. No crepitus or effusions noted. HAV  B/L.  Skin: No Porokeratosis. No infection or ulcers.  Callus along the lateral heel right foot.  Asymptomatic.  Diagnosis:  Onychomycosis, , Pain in right toe, pain in left toes  Treatment & Plan Procedures and Treatment: Consent by patient was obtained for treatment procedures. The patient understood the discussion of treatment and procedures well. All questions were answered thoroughly reviewed. Debridement of mycotic and hypertrophic toenails, 1 through 5 bilateral and clearing of subungual debris. No ulceration, no infection noted.  Return Visit-Office Procedure: Patient instructed to return to the office for a follow up visit 4 months for continued evaluation and treatment.    Gardiner Barefoot DPM

## 2018-12-17 DIAGNOSIS — Z7982 Long term (current) use of aspirin: Secondary | ICD-10-CM | POA: Diagnosis not present

## 2018-12-17 DIAGNOSIS — I130 Hypertensive heart and chronic kidney disease with heart failure and stage 1 through stage 4 chronic kidney disease, or unspecified chronic kidney disease: Secondary | ICD-10-CM | POA: Diagnosis not present

## 2018-12-17 DIAGNOSIS — E1039 Type 1 diabetes mellitus with other diabetic ophthalmic complication: Secondary | ICD-10-CM | POA: Diagnosis not present

## 2018-12-17 DIAGNOSIS — Z87891 Personal history of nicotine dependence: Secondary | ICD-10-CM | POA: Diagnosis not present

## 2018-12-17 DIAGNOSIS — I509 Heart failure, unspecified: Secondary | ICD-10-CM | POA: Diagnosis not present

## 2018-12-17 DIAGNOSIS — E785 Hyperlipidemia, unspecified: Secondary | ICD-10-CM | POA: Diagnosis not present

## 2018-12-17 DIAGNOSIS — H547 Unspecified visual loss: Secondary | ICD-10-CM | POA: Diagnosis not present

## 2018-12-17 DIAGNOSIS — I2510 Atherosclerotic heart disease of native coronary artery without angina pectoris: Secondary | ICD-10-CM | POA: Diagnosis not present

## 2018-12-17 DIAGNOSIS — N189 Chronic kidney disease, unspecified: Secondary | ICD-10-CM | POA: Diagnosis not present

## 2018-12-17 DIAGNOSIS — E1022 Type 1 diabetes mellitus with diabetic chronic kidney disease: Secondary | ICD-10-CM | POA: Diagnosis not present

## 2018-12-21 ENCOUNTER — Telehealth: Payer: Self-pay | Admitting: Family

## 2018-12-21 NOTE — Telephone Encounter (Signed)
Home Health Verbal Orders - Caller/Agency: Annamarie Dawley, RN, [place] Number: [phone] Requesting OT/PT/Skilled Nursing/Social Work/Speech Therapy: Skilled nursing Frequency:  Re-certification to continue pill box, 1x a wk every other week for 9wks

## 2018-12-21 NOTE — Telephone Encounter (Signed)
Called [place] at [facility] and left voice message giving verbal orders for skilled nursing re-certification to continue pill box, 1x wk every other week for 9 wks as requested.

## 2018-12-25 DIAGNOSIS — Z7982 Long term (current) use of aspirin: Secondary | ICD-10-CM | POA: Diagnosis not present

## 2018-12-25 DIAGNOSIS — I509 Heart failure, unspecified: Secondary | ICD-10-CM | POA: Diagnosis not present

## 2018-12-25 DIAGNOSIS — I130 Hypertensive heart and chronic kidney disease with heart failure and stage 1 through stage 4 chronic kidney disease, or unspecified chronic kidney disease: Secondary | ICD-10-CM | POA: Diagnosis not present

## 2018-12-25 DIAGNOSIS — Z87891 Personal history of nicotine dependence: Secondary | ICD-10-CM | POA: Diagnosis not present

## 2018-12-25 DIAGNOSIS — I2510 Atherosclerotic heart disease of native coronary artery without angina pectoris: Secondary | ICD-10-CM | POA: Diagnosis not present

## 2018-12-25 DIAGNOSIS — E1022 Type 1 diabetes mellitus with diabetic chronic kidney disease: Secondary | ICD-10-CM | POA: Diagnosis not present

## 2018-12-25 DIAGNOSIS — N189 Chronic kidney disease, unspecified: Secondary | ICD-10-CM | POA: Diagnosis not present

## 2018-12-25 DIAGNOSIS — H547 Unspecified visual loss: Secondary | ICD-10-CM | POA: Diagnosis not present

## 2018-12-25 DIAGNOSIS — E1039 Type 1 diabetes mellitus with other diabetic ophthalmic complication: Secondary | ICD-10-CM | POA: Diagnosis not present

## 2018-12-25 DIAGNOSIS — E785 Hyperlipidemia, unspecified: Secondary | ICD-10-CM | POA: Diagnosis not present

## 2019-01-01 DIAGNOSIS — I10 Essential (primary) hypertension: Secondary | ICD-10-CM | POA: Diagnosis not present

## 2019-01-01 DIAGNOSIS — I2510 Atherosclerotic heart disease of native coronary artery without angina pectoris: Secondary | ICD-10-CM | POA: Diagnosis not present

## 2019-01-01 DIAGNOSIS — I739 Peripheral vascular disease, unspecified: Secondary | ICD-10-CM | POA: Diagnosis not present

## 2019-01-01 DIAGNOSIS — E782 Mixed hyperlipidemia: Secondary | ICD-10-CM | POA: Diagnosis not present

## 2019-01-08 DIAGNOSIS — Z7982 Long term (current) use of aspirin: Secondary | ICD-10-CM | POA: Diagnosis not present

## 2019-01-08 DIAGNOSIS — I130 Hypertensive heart and chronic kidney disease with heart failure and stage 1 through stage 4 chronic kidney disease, or unspecified chronic kidney disease: Secondary | ICD-10-CM | POA: Diagnosis not present

## 2019-01-08 DIAGNOSIS — E785 Hyperlipidemia, unspecified: Secondary | ICD-10-CM | POA: Diagnosis not present

## 2019-01-08 DIAGNOSIS — Z87891 Personal history of nicotine dependence: Secondary | ICD-10-CM | POA: Diagnosis not present

## 2019-01-08 DIAGNOSIS — E1022 Type 1 diabetes mellitus with diabetic chronic kidney disease: Secondary | ICD-10-CM | POA: Diagnosis not present

## 2019-01-08 DIAGNOSIS — N189 Chronic kidney disease, unspecified: Secondary | ICD-10-CM | POA: Diagnosis not present

## 2019-01-08 DIAGNOSIS — H547 Unspecified visual loss: Secondary | ICD-10-CM | POA: Diagnosis not present

## 2019-01-08 DIAGNOSIS — I2510 Atherosclerotic heart disease of native coronary artery without angina pectoris: Secondary | ICD-10-CM | POA: Diagnosis not present

## 2019-01-08 DIAGNOSIS — E1039 Type 1 diabetes mellitus with other diabetic ophthalmic complication: Secondary | ICD-10-CM | POA: Diagnosis not present

## 2019-01-08 DIAGNOSIS — I509 Heart failure, unspecified: Secondary | ICD-10-CM | POA: Diagnosis not present

## 2019-01-13 DIAGNOSIS — E785 Hyperlipidemia, unspecified: Secondary | ICD-10-CM

## 2019-01-13 DIAGNOSIS — H547 Unspecified visual loss: Secondary | ICD-10-CM

## 2019-01-13 DIAGNOSIS — I2510 Atherosclerotic heart disease of native coronary artery without angina pectoris: Secondary | ICD-10-CM | POA: Diagnosis not present

## 2019-01-13 DIAGNOSIS — Z7982 Long term (current) use of aspirin: Secondary | ICD-10-CM

## 2019-01-13 DIAGNOSIS — Z87891 Personal history of nicotine dependence: Secondary | ICD-10-CM

## 2019-01-13 DIAGNOSIS — I509 Heart failure, unspecified: Secondary | ICD-10-CM

## 2019-01-13 DIAGNOSIS — E1039 Type 1 diabetes mellitus with other diabetic ophthalmic complication: Secondary | ICD-10-CM | POA: Diagnosis not present

## 2019-01-13 DIAGNOSIS — E1022 Type 1 diabetes mellitus with diabetic chronic kidney disease: Secondary | ICD-10-CM

## 2019-01-13 DIAGNOSIS — N189 Chronic kidney disease, unspecified: Secondary | ICD-10-CM

## 2019-01-13 DIAGNOSIS — I130 Hypertensive heart and chronic kidney disease with heart failure and stage 1 through stage 4 chronic kidney disease, or unspecified chronic kidney disease: Secondary | ICD-10-CM | POA: Diagnosis not present

## 2019-01-20 ENCOUNTER — Ambulatory Visit: Payer: Medicaid Other | Admitting: Family

## 2019-01-22 ENCOUNTER — Ambulatory Visit: Payer: Medicaid Other | Admitting: Family

## 2019-01-22 ENCOUNTER — Encounter: Payer: Self-pay | Admitting: Family

## 2019-01-22 ENCOUNTER — Other Ambulatory Visit: Payer: Self-pay

## 2019-01-22 VITALS — BP 124/70 | HR 61 | Temp 97.9°F | Wt 214.6 lb

## 2019-01-22 DIAGNOSIS — I509 Heart failure, unspecified: Secondary | ICD-10-CM | POA: Diagnosis not present

## 2019-01-22 DIAGNOSIS — Z7982 Long term (current) use of aspirin: Secondary | ICD-10-CM | POA: Diagnosis not present

## 2019-01-22 DIAGNOSIS — I2510 Atherosclerotic heart disease of native coronary artery without angina pectoris: Secondary | ICD-10-CM | POA: Diagnosis not present

## 2019-01-22 DIAGNOSIS — Z87891 Personal history of nicotine dependence: Secondary | ICD-10-CM | POA: Diagnosis not present

## 2019-01-22 DIAGNOSIS — E1039 Type 1 diabetes mellitus with other diabetic ophthalmic complication: Secondary | ICD-10-CM | POA: Diagnosis not present

## 2019-01-22 DIAGNOSIS — E1022 Type 1 diabetes mellitus with diabetic chronic kidney disease: Secondary | ICD-10-CM | POA: Diagnosis not present

## 2019-01-22 DIAGNOSIS — H547 Unspecified visual loss: Secondary | ICD-10-CM | POA: Diagnosis not present

## 2019-01-22 DIAGNOSIS — E785 Hyperlipidemia, unspecified: Secondary | ICD-10-CM | POA: Diagnosis not present

## 2019-01-22 DIAGNOSIS — I130 Hypertensive heart and chronic kidney disease with heart failure and stage 1 through stage 4 chronic kidney disease, or unspecified chronic kidney disease: Secondary | ICD-10-CM | POA: Diagnosis not present

## 2019-01-22 DIAGNOSIS — N189 Chronic kidney disease, unspecified: Secondary | ICD-10-CM | POA: Diagnosis not present

## 2019-01-22 DIAGNOSIS — I10 Essential (primary) hypertension: Secondary | ICD-10-CM | POA: Diagnosis not present

## 2019-01-22 DIAGNOSIS — Z1239 Encounter for other screening for malignant neoplasm of breast: Secondary | ICD-10-CM

## 2019-01-22 NOTE — Assessment & Plan Note (Signed)
Follows with endocrine however continue to have lows.  Call to endocrine in regards to this.  I think patient would benefit from diabetic education, in particular with carbohydrate counting.  Emphasized the importance of calling Dr. Gabriel Carina to make aware any lows, patient verbalized understanding

## 2019-01-22 NOTE — Assessment & Plan Note (Addendum)
At goal with appropriate size BP bladder. Education provided.  No changes today to regimen

## 2019-01-22 NOTE — Progress Notes (Signed)
Subjective:    Patient ID: Chelsea Barton, female    DOB: September 24, 1960, 58 y.o.   MRN: [id number]  CC: Chelsea Barton is a 58 y.o. female who presents today for follow up.   HPI: Feels well today. No complaints.  Coping well during pandemic. Has been staying home.   HTN- at home labile from 140/90 to 115/60. Compliant with medications. .Denies exertional chest pain or pressure, numbness or tingling radiating to left arm or jaw, palpitations, dizziness, frequent headaches, changes in vision, or shortness of breath.    CAD- follows with Chelsea Barton; reviewed last appointment 12/2018.   DM-follows Dr. Gabriel Barton.   she continues to have.  Hypoglycemic episodes.   yesterday before she had lunch, her blood sugar was 118.  She had salad with chicken.  Gave her self 10 units of insulin.  Blood sugar dropped to less than 50. Rebounded with eating again.  No syncope.  Follow-up with Dr. Gabriel Barton later this month.   HISTORY:  Past Medical History:  Diagnosis Date  . Diabetes mellitus   . Hyperlipidemia   . Hypertension    History reviewed. No pertinent surgical history. Family History  Problem Relation Age of Onset  . Diabetes Sister        3/5 sisters have DM  . Breast cancer Neg Hx   . Colon cancer Neg Hx     Allergies: Patient has no known allergies. Current Outpatient Medications on File Prior to Visit  Medication Sig Dispense Refill  . ACCU-CHEK AVIVA PLUS test strip USE UP TO FOUR TIMES DAILY 350 each 1  . ACCU-CHEK FASTCLIX LANCETS MISC USE UP TO FOUR TIMES DAILY 306 each 0  . aspirin 81 MG tablet Take 1 tablet (81 mg total) by mouth daily. 30 tablet 6  . blood glucose meter kit and supplies KIT Dispense based on patient and insurance preference. Use up to four times daily as directed. (FOR ICD-9 250.00, 250.01). 1 each 0  . carvedilol (COREG) 12.5 MG tablet TAKE 1 TABLET BY MOUTH TWICE DAILY WITH A MEAL 180 tablet 1  . digoxin (LANOXIN) 0.125 MG tablet TAKE 1 TABLET BY MOUTH EVERY DAY 90  tablet 1  . enalapril (VASOTEC) 20 MG tablet TAKE 2 TABLETS BY MOUTH DAILY 90 tablet 2  . Insulin Glargine (LANTUS SOLOSTAR) 100 UNIT/ML Solostar Pen Inject into the skin.    . Insulin Pen Needle (B-D ULTRAFINE III SHORT PEN) 31G X 8 MM MISC USE AS DIRECTED 100 each 6  . Insulin Pen Needle (B-D ULTRAFINE III SHORT PEN) 31G X 8 MM MISC USE AS DIRECTED 100 each 0  . Multiple Vitamin (MULTIVITAMIN) capsule Take 1 capsule by mouth daily.      . Multiple Vitamins-Minerals (MULTIVITAMIN ADULTS PO) Take by mouth.    Marland Kitchen NOVOLOG FLEXPEN 100 UNIT/ML FlexPen     . simvastatin (ZOCOR) 20 MG tablet TAKE 1 TABLET(20 MG) BY MOUTH DAILY AT 6 PM 90 tablet 1  . vitamin D, CHOLECALCIFEROL, 400 UNITS tablet Take 400 Units by mouth 2 (two) times daily.       No current facility-administered medications on file prior to visit.     Social History   Tobacco Use  . Smoking status: Former Smoker    Quit date: 06/18/2005    Years since quitting: 13.6  . Smokeless tobacco: Never Used  Substance Use Topics  . Alcohol use: No  . Drug use: No    Review of Systems  Constitutional: Negative for chills  and fever.  Respiratory: Negative for cough.   Cardiovascular: Negative for chest pain and palpitations.  Gastrointestinal: Negative for nausea and vomiting.      Objective:    BP 124/70 Comment: using larger , appropriate BP cuff  Pulse 61   Temp 97.9 F (36.6 C)   Wt 214 lb 9.6 oz (97.3 kg)   SpO2 98%   BMI 35.71 kg/m  BP Readings from Last 3 Encounters:  01/22/19 124/70  05/29/18 112/78  01/26/18 (!) 142/82   Wt Readings from Last 3 Encounters:  01/22/19 214 lb 9.6 oz (97.3 kg)  05/29/18 213 lb (96.6 kg)  01/26/18 211 lb 8 oz (95.9 kg)    Physical Exam Vitals signs reviewed.  Constitutional:      Appearance: She is well-developed.  Eyes:     Conjunctiva/sclera: Conjunctivae normal.  Cardiovascular:     Rate and Rhythm: Normal rate and regular rhythm.     Pulses: Normal pulses.     Heart  sounds: Normal heart sounds.  Pulmonary:     Effort: Pulmonary effort is normal.     Breath sounds: Normal breath sounds. No wheezing, rhonchi or rales.  Skin:    General: Skin is warm and dry.  Neurological:     Mental Status: She is alert.  Psychiatric:        Speech: Speech normal.        Behavior: Behavior normal.        Thought Content: Thought content normal.        Assessment & Plan:   Problem List Items Addressed This Visit      Cardiovascular and Mediastinum   Hypertension    At goal with appropriate size BP bladder. Education provided.  No changes today to regimen         Endocrine   DM type 1 (diabetes mellitus, type 1) ([place])    Follows with endocrine however continue to have lows.  Call to endocrine in regards to this.  I think patient would benefit from diabetic education, in particular with carbohydrate counting.  Emphasized the importance of calling Dr. Gabriel Barton to make aware any lows, patient verbalized understanding       Other Visit Diagnoses    Screening for breast cancer    -  Primary   Relevant Orders   MM 3D SCREEN BREAST BILATERAL       I am having Chelsea Barton maintain her multivitamin, vitamin D (CHOLECALCIFEROL), aspirin, Insulin Pen Needle, blood glucose meter kit and supplies, Accu-Chek FastClix Lancets, Accu-Chek Aviva Plus, carvedilol, digoxin, enalapril, Multiple Vitamins-Minerals (MULTIVITAMIN ADULTS PO), NovoLOG FlexPen, Insulin Glargine, Insulin Pen Needle, and simvastatin.   No orders of the defined types were placed in this encounter.   Return precautions given.   Risks, benefits, and alternatives of the medications and treatment plan prescribed today were discussed, and patient expressed understanding.   Education regarding symptom management and diagnosis given to patient on AVS.  Continue to follow with Chelsea Hawthorne, FNP for routine health maintenance.   Chelsea Barton and I agreed with plan.   Mable Paris,  FNP

## 2019-01-22 NOTE — Patient Instructions (Addendum)
Please ensure at home that you are using appropriately sized blood pressure cuff. The bladder ( width) needs to go at least half way around the upper part of your arm.   Monitor blood pressure,  Goal is less than 130/80, based on newest guidelines; if persistently higher, please make sooner follow up appointment so we can recheck you blood pressure and manage medications.  We are calling Dr Gabriel Carina.  Please call her office with any recurrence of hypoglycemic episodes.  I think you would benefit from diabetic education.   Please call call and schedule your 3D mammogram as discussed.   [facility] [place]  [place], [place]   Stay safe!

## 2019-01-26 ENCOUNTER — Other Ambulatory Visit: Payer: Self-pay | Admitting: Family

## 2019-01-27 ENCOUNTER — Telehealth: Payer: Self-pay

## 2019-01-27 NOTE — Telephone Encounter (Signed)
I called & spoke with receptionist at [facility]. See sent a message back to Dr. Joycie Peek nurse, Janett Billow. I asked that they reach out to patient due to he recent lows after meals. I asked that per Margaret's suggestion if there was a possible diabetic educator she could see or either have her meal time insulin adjusted to prevent her lows blood sugars after meals.

## 2019-02-05 DIAGNOSIS — Z7982 Long term (current) use of aspirin: Secondary | ICD-10-CM | POA: Diagnosis not present

## 2019-02-05 DIAGNOSIS — E785 Hyperlipidemia, unspecified: Secondary | ICD-10-CM | POA: Diagnosis not present

## 2019-02-05 DIAGNOSIS — N189 Chronic kidney disease, unspecified: Secondary | ICD-10-CM | POA: Diagnosis not present

## 2019-02-05 DIAGNOSIS — E1039 Type 1 diabetes mellitus with other diabetic ophthalmic complication: Secondary | ICD-10-CM | POA: Diagnosis not present

## 2019-02-05 DIAGNOSIS — I2510 Atherosclerotic heart disease of native coronary artery without angina pectoris: Secondary | ICD-10-CM | POA: Diagnosis not present

## 2019-02-05 DIAGNOSIS — I509 Heart failure, unspecified: Secondary | ICD-10-CM | POA: Diagnosis not present

## 2019-02-05 DIAGNOSIS — H547 Unspecified visual loss: Secondary | ICD-10-CM | POA: Diagnosis not present

## 2019-02-05 DIAGNOSIS — Z87891 Personal history of nicotine dependence: Secondary | ICD-10-CM | POA: Diagnosis not present

## 2019-02-05 DIAGNOSIS — E1022 Type 1 diabetes mellitus with diabetic chronic kidney disease: Secondary | ICD-10-CM | POA: Diagnosis not present

## 2019-02-05 DIAGNOSIS — I130 Hypertensive heart and chronic kidney disease with heart failure and stage 1 through stage 4 chronic kidney disease, or unspecified chronic kidney disease: Secondary | ICD-10-CM | POA: Diagnosis not present

## 2019-02-08 ENCOUNTER — Encounter: Payer: Self-pay | Admitting: Family

## 2019-02-09 DIAGNOSIS — E1042 Type 1 diabetes mellitus with diabetic polyneuropathy: Secondary | ICD-10-CM | POA: Diagnosis not present

## 2019-02-16 DIAGNOSIS — E103553 Type 1 diabetes mellitus with stable proliferative diabetic retinopathy, bilateral: Secondary | ICD-10-CM | POA: Diagnosis not present

## 2019-02-16 DIAGNOSIS — E1042 Type 1 diabetes mellitus with diabetic polyneuropathy: Secondary | ICD-10-CM | POA: Diagnosis not present

## 2019-02-16 DIAGNOSIS — E1029 Type 1 diabetes mellitus with other diabetic kidney complication: Secondary | ICD-10-CM | POA: Diagnosis not present

## 2019-02-16 DIAGNOSIS — E1022 Type 1 diabetes mellitus with diabetic chronic kidney disease: Secondary | ICD-10-CM | POA: Diagnosis not present

## 2019-02-19 ENCOUNTER — Telehealth: Payer: Self-pay | Admitting: *Deleted

## 2019-02-19 DIAGNOSIS — N189 Chronic kidney disease, unspecified: Secondary | ICD-10-CM | POA: Diagnosis not present

## 2019-02-19 DIAGNOSIS — I2510 Atherosclerotic heart disease of native coronary artery without angina pectoris: Secondary | ICD-10-CM | POA: Diagnosis not present

## 2019-02-19 DIAGNOSIS — Z87891 Personal history of nicotine dependence: Secondary | ICD-10-CM | POA: Diagnosis not present

## 2019-02-19 DIAGNOSIS — E1022 Type 1 diabetes mellitus with diabetic chronic kidney disease: Secondary | ICD-10-CM | POA: Diagnosis not present

## 2019-02-19 DIAGNOSIS — E785 Hyperlipidemia, unspecified: Secondary | ICD-10-CM | POA: Diagnosis not present

## 2019-02-19 DIAGNOSIS — E1039 Type 1 diabetes mellitus with other diabetic ophthalmic complication: Secondary | ICD-10-CM | POA: Diagnosis not present

## 2019-02-19 DIAGNOSIS — I509 Heart failure, unspecified: Secondary | ICD-10-CM | POA: Diagnosis not present

## 2019-02-19 DIAGNOSIS — H547 Unspecified visual loss: Secondary | ICD-10-CM | POA: Diagnosis not present

## 2019-02-19 DIAGNOSIS — I130 Hypertensive heart and chronic kidney disease with heart failure and stage 1 through stage 4 chronic kidney disease, or unspecified chronic kidney disease: Secondary | ICD-10-CM | POA: Diagnosis not present

## 2019-02-19 DIAGNOSIS — Z7982 Long term (current) use of aspirin: Secondary | ICD-10-CM | POA: Diagnosis not present

## 2019-02-19 NOTE — Telephone Encounter (Signed)
Copied from [place] [id number]. Topic: General - Other >> Feb 19, 2019 10:42 AM Celene Kras A wrote: Reason for CRM:  [phone] [name], from [facility], calling to get a recert approval. Please advise.

## 2019-02-19 NOTE — Telephone Encounter (Signed)
Yes that is okay  

## 2019-02-22 ENCOUNTER — Other Ambulatory Visit: Payer: Self-pay | Admitting: Family Medicine

## 2019-02-22 MED ORDER — ACCU-CHEK AVIVA PLUS VI STRP: ORAL_STRIP | 0 refills | Status: DC

## 2019-02-23 NOTE — Telephone Encounter (Signed)
I spoke with [name] & gave verbal "okay' to fill patient's pill box.

## 2019-02-24 ENCOUNTER — Other Ambulatory Visit: Payer: Self-pay

## 2019-02-24 ENCOUNTER — Telehealth: Payer: Self-pay | Admitting: *Deleted

## 2019-02-24 MED ORDER — ACCU-CHEK AVIVA PLUS VI STRP: ORAL_STRIP | 12 refills | Status: DC

## 2019-02-24 NOTE — Telephone Encounter (Signed)
Left message for patient to notify them that it is time to schedule annual low dose lung cancer screening CT scan. Instructed patient to call back to verify information prior to the scan being scheduled.  

## 2019-03-01 ENCOUNTER — Other Ambulatory Visit: Payer: Self-pay

## 2019-03-02 ENCOUNTER — Encounter: Payer: Self-pay | Admitting: *Deleted

## 2019-03-05 DIAGNOSIS — H547 Unspecified visual loss: Secondary | ICD-10-CM | POA: Diagnosis not present

## 2019-03-05 DIAGNOSIS — E785 Hyperlipidemia, unspecified: Secondary | ICD-10-CM | POA: Diagnosis not present

## 2019-03-05 DIAGNOSIS — Z7982 Long term (current) use of aspirin: Secondary | ICD-10-CM | POA: Diagnosis not present

## 2019-03-05 DIAGNOSIS — I130 Hypertensive heart and chronic kidney disease with heart failure and stage 1 through stage 4 chronic kidney disease, or unspecified chronic kidney disease: Secondary | ICD-10-CM | POA: Diagnosis not present

## 2019-03-05 DIAGNOSIS — E1022 Type 1 diabetes mellitus with diabetic chronic kidney disease: Secondary | ICD-10-CM | POA: Diagnosis not present

## 2019-03-05 DIAGNOSIS — Z87891 Personal history of nicotine dependence: Secondary | ICD-10-CM | POA: Diagnosis not present

## 2019-03-05 DIAGNOSIS — E1039 Type 1 diabetes mellitus with other diabetic ophthalmic complication: Secondary | ICD-10-CM | POA: Diagnosis not present

## 2019-03-05 DIAGNOSIS — N189 Chronic kidney disease, unspecified: Secondary | ICD-10-CM | POA: Diagnosis not present

## 2019-03-05 DIAGNOSIS — I509 Heart failure, unspecified: Secondary | ICD-10-CM | POA: Diagnosis not present

## 2019-03-05 DIAGNOSIS — I2510 Atherosclerotic heart disease of native coronary artery without angina pectoris: Secondary | ICD-10-CM | POA: Diagnosis not present

## 2019-03-11 ENCOUNTER — Ambulatory Visit
Admission: RE | Admit: 2019-03-11 | Discharge: 2019-03-11 | Disposition: A | Discharge status: Home / Self Care | Payer: Medicaid Other | Source: Ambulatory Visit | Attending: Nurse Practitioner | Admitting: Nurse Practitioner

## 2019-03-11 ENCOUNTER — Other Ambulatory Visit: Payer: Self-pay

## 2019-03-11 DIAGNOSIS — Z87891 Personal history of nicotine dependence: Secondary | ICD-10-CM | POA: Diagnosis not present

## 2019-03-11 DIAGNOSIS — Z122 Encounter for screening for malignant neoplasm of respiratory organs: Secondary | ICD-10-CM | POA: Insufficient documentation

## 2019-03-11 DIAGNOSIS — E113553 Type 2 diabetes mellitus with stable proliferative diabetic retinopathy, bilateral: Secondary | ICD-10-CM | POA: Diagnosis not present

## 2019-03-11 DIAGNOSIS — Z136 Encounter for screening for cardiovascular disorders: Secondary | ICD-10-CM | POA: Diagnosis not present

## 2019-03-11 LAB — HM DIABETES EYE EXAM

## 2019-03-17 ENCOUNTER — Telehealth: Payer: Self-pay | Admitting: Internal Medicine

## 2019-03-17 ENCOUNTER — Encounter: Payer: Self-pay | Admitting: *Deleted

## 2019-03-17 NOTE — Telephone Encounter (Signed)
Dr Nicki Reaper asked to hold off until, she speaks with patient first.  Will let me know when/what to do.

## 2019-03-17 NOTE — Telephone Encounter (Signed)
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

## 2019-03-18 ENCOUNTER — Encounter: Payer: Self-pay | Admitting: Internal Medicine

## 2019-03-18 ENCOUNTER — Ambulatory Visit (INDEPENDENT_AMBULATORY_CARE_PROVIDER_SITE_OTHER): Payer: Medicaid Other | Admitting: Internal Medicine

## 2019-03-18 DIAGNOSIS — K228 Other specified diseases of esophagus: Secondary | ICD-10-CM

## 2019-03-18 DIAGNOSIS — K2289 Other specified disease of esophagus: Secondary | ICD-10-CM

## 2019-03-18 DIAGNOSIS — E1039 Type 1 diabetes mellitus with other diabetic ophthalmic complication: Secondary | ICD-10-CM

## 2019-03-18 NOTE — Progress Notes (Signed)
Patient ID: BETTE BRIENZA, female   DOB: 16-Aug-1960, 58 y.o.   MRN: [id number]   Virtual Visit via telephone Note  This visit type was conducted due to national recommendations for restrictions regarding the COVID-19 pandemic (e.g. social distancing).  This format is felt to be most appropriate for this patient at this time.  All issues noted in this document were discussed and addressed.  No physical exam was performed (except for noted visual exam findings with Video Visits).   I connected with Akshitha Heiny by telephone and verified that I am speaking with the correct person using two identifiers. Location patient: home Location provider: work Persons participating in the telephone visit: patient, provider  I discussed the limitations, risks, security and privacy concerns of performing an evaluation and management service by telephone and the availability of in person appointments. The patient expressed understanding and agreed to proceed.   Reason for visit: work in appt.    HPI: Pt of Mable Paris. She recently underwent lung screening - CT chest.  Was incidentally found to have a circumferential wall thickening in the distal esophagus.  Commented images suspicious for esophagitis.  She reports she previously smoked.  Quit 10-15 year ago.  Denies any acid reflux.  No swallowing problems.  No dysphagia.  No nausea or vomiting.  Bowels moving.  No abdominal pain.  Per report is taking prilosec.     ROS: See pertinent positives and negatives per HPI.  Past Medical History:  Diagnosis Date  . Diabetes mellitus   . Hyperlipidemia   . Hypertension     History reviewed. No pertinent surgical history.  Family History  Problem Relation Age of Onset  . Diabetes Sister        3/5 sisters have DM  . Breast cancer Neg Hx   . Colon cancer Neg Hx     SOCIAL HX: reviewed.     Current Outpatient Medications:  .  ACCU-CHEK FASTCLIX LANCETS MISC, USE UP TO FOUR TIMES DAILY, Disp: 306  each, Rfl: 0 .  aspirin 81 MG tablet, Take 1 tablet (81 mg total) by mouth daily., Disp: 30 tablet, Rfl: 6 .  blood glucose meter kit and supplies KIT, Dispense based on patient and insurance preference. Use up to four times daily as directed. (FOR ICD-9 250.00, 250.01)., Disp: 1 each, Rfl: 0 .  carvedilol (COREG) 12.5 MG tablet, TAKE 1 TABLET BY MOUTH TWICE DAILY WITH A MEAL, Disp: 180 tablet, Rfl: 1 .  digoxin (LANOXIN) 0.125 MG tablet, TAKE 1 TABLET BY MOUTH EVERY DAY, Disp: 90 tablet, Rfl: 1 .  enalapril (VASOTEC) 20 MG tablet, TAKE 2 TABLETS BY MOUTH DAILY, Disp: 90 tablet, Rfl: 2 .  glucose blood (ACCU-CHEK AVIVA PLUS) test strip, Use as instructed., Disp: 100 each, Rfl: 12 .  Insulin Glargine (LANTUS SOLOSTAR) 100 UNIT/ML Solostar Pen, Inject into the skin., Disp: , Rfl:  .  Insulin Pen Needle (B-D ULTRAFINE III SHORT PEN) 31G X 8 MM MISC, USE AS DIRECTED, Disp: 100 each, Rfl: 6 .  Insulin Pen Needle (B-D ULTRAFINE III SHORT PEN) 31G X 8 MM MISC, USE AS DIRECTED, Disp: 100 each, Rfl: 1 .  Multiple Vitamin (MULTIVITAMIN) capsule, Take 1 capsule by mouth daily.  , Disp: , Rfl:  .  Multiple Vitamins-Minerals (MULTIVITAMIN ADULTS PO), Take by mouth., Disp: , Rfl:  .  NOVOLOG FLEXPEN 100 UNIT/ML FlexPen, , Disp: , Rfl:  .  simvastatin (ZOCOR) 20 MG tablet, TAKE 1 TABLET(20 MG) BY MOUTH DAILY  AT 6 PM, Disp: 90 tablet, Rfl: 1 .  vitamin D, CHOLECALCIFEROL, 400 UNITS tablet, Take 400 Units by mouth 2 (two) times daily.  , Disp: , Rfl:   EXAM:  GENERAL: alert.  Sounds to be in no acute distress.  Answering questions appropriately.    PSYCH/NEURO: pleasant and cooperative, no obvious depression or anxiety, speech and thought processing grossly intact  ASSESSMENT AND PLAN:  Discussed the following assessment and plan:  DM type 1 (diabetes mellitus, type 1) ([place]) Follows with endocrinology.  She reports sugars doing well.    Esophageal thickening Noted on screening CT scan.  Discussed with  her today. Previous smoking.  Discussed the need for further evaluation.  Pt in agreement.  Refer to GI for further w/up and question of need for EGD.      I discussed the assessment and treatment plan with the patient. The patient was provided an opportunity to ask questions and all were answered. The patient agreed with the plan and demonstrated an understanding of the instructions.   The patient was advised to call back or seek an in-person evaluation if the symptoms worsen or if the condition fails to improve as anticipated.  I provided 12 minutes of non-face-to-face time during this encounter.   Einar Pheasant, MD

## 2019-03-19 DIAGNOSIS — Z7982 Long term (current) use of aspirin: Secondary | ICD-10-CM | POA: Diagnosis not present

## 2019-03-19 DIAGNOSIS — I509 Heart failure, unspecified: Secondary | ICD-10-CM | POA: Diagnosis not present

## 2019-03-19 DIAGNOSIS — Z87891 Personal history of nicotine dependence: Secondary | ICD-10-CM | POA: Diagnosis not present

## 2019-03-19 DIAGNOSIS — N189 Chronic kidney disease, unspecified: Secondary | ICD-10-CM | POA: Diagnosis not present

## 2019-03-19 DIAGNOSIS — E785 Hyperlipidemia, unspecified: Secondary | ICD-10-CM | POA: Diagnosis not present

## 2019-03-19 DIAGNOSIS — E1039 Type 1 diabetes mellitus with other diabetic ophthalmic complication: Secondary | ICD-10-CM | POA: Diagnosis not present

## 2019-03-19 DIAGNOSIS — I130 Hypertensive heart and chronic kidney disease with heart failure and stage 1 through stage 4 chronic kidney disease, or unspecified chronic kidney disease: Secondary | ICD-10-CM | POA: Diagnosis not present

## 2019-03-19 DIAGNOSIS — I2510 Atherosclerotic heart disease of native coronary artery without angina pectoris: Secondary | ICD-10-CM | POA: Diagnosis not present

## 2019-03-19 DIAGNOSIS — E1022 Type 1 diabetes mellitus with diabetic chronic kidney disease: Secondary | ICD-10-CM | POA: Diagnosis not present

## 2019-03-19 DIAGNOSIS — H547 Unspecified visual loss: Secondary | ICD-10-CM | POA: Diagnosis not present

## 2019-03-21 ENCOUNTER — Encounter: Payer: Self-pay | Admitting: Internal Medicine

## 2019-03-21 NOTE — Assessment & Plan Note (Signed)
Follows with endocrinology.  She reports sugars doing well.

## 2019-03-21 NOTE — Assessment & Plan Note (Signed)
Noted on screening CT scan.  Discussed with her today. Previous smoking.  Discussed the need for further evaluation.  Pt in agreement.  Refer to GI for further w/up and question of need for EGD.

## 2019-03-25 DIAGNOSIS — E103599 Type 1 diabetes mellitus with proliferative diabetic retinopathy without macular edema, unspecified eye: Secondary | ICD-10-CM | POA: Insufficient documentation

## 2019-03-26 DIAGNOSIS — Z7982 Long term (current) use of aspirin: Secondary | ICD-10-CM

## 2019-03-26 DIAGNOSIS — Z87891 Personal history of nicotine dependence: Secondary | ICD-10-CM

## 2019-03-26 DIAGNOSIS — I130 Hypertensive heart and chronic kidney disease with heart failure and stage 1 through stage 4 chronic kidney disease, or unspecified chronic kidney disease: Secondary | ICD-10-CM

## 2019-03-26 DIAGNOSIS — H547 Unspecified visual loss: Secondary | ICD-10-CM

## 2019-03-26 DIAGNOSIS — E1022 Type 1 diabetes mellitus with diabetic chronic kidney disease: Secondary | ICD-10-CM

## 2019-03-26 DIAGNOSIS — E785 Hyperlipidemia, unspecified: Secondary | ICD-10-CM

## 2019-03-26 DIAGNOSIS — N189 Chronic kidney disease, unspecified: Secondary | ICD-10-CM

## 2019-03-26 DIAGNOSIS — I2510 Atherosclerotic heart disease of native coronary artery without angina pectoris: Secondary | ICD-10-CM

## 2019-03-26 DIAGNOSIS — E1039 Type 1 diabetes mellitus with other diabetic ophthalmic complication: Secondary | ICD-10-CM

## 2019-03-26 DIAGNOSIS — I509 Heart failure, unspecified: Secondary | ICD-10-CM

## 2019-04-02 DIAGNOSIS — E1022 Type 1 diabetes mellitus with diabetic chronic kidney disease: Secondary | ICD-10-CM | POA: Diagnosis not present

## 2019-04-02 DIAGNOSIS — I509 Heart failure, unspecified: Secondary | ICD-10-CM | POA: Diagnosis not present

## 2019-04-02 DIAGNOSIS — I130 Hypertensive heart and chronic kidney disease with heart failure and stage 1 through stage 4 chronic kidney disease, or unspecified chronic kidney disease: Secondary | ICD-10-CM | POA: Diagnosis not present

## 2019-04-02 DIAGNOSIS — N189 Chronic kidney disease, unspecified: Secondary | ICD-10-CM | POA: Diagnosis not present

## 2019-04-02 DIAGNOSIS — E1039 Type 1 diabetes mellitus with other diabetic ophthalmic complication: Secondary | ICD-10-CM | POA: Diagnosis not present

## 2019-04-02 DIAGNOSIS — E785 Hyperlipidemia, unspecified: Secondary | ICD-10-CM | POA: Diagnosis not present

## 2019-04-02 DIAGNOSIS — H547 Unspecified visual loss: Secondary | ICD-10-CM | POA: Diagnosis not present

## 2019-04-02 DIAGNOSIS — I2510 Atherosclerotic heart disease of native coronary artery without angina pectoris: Secondary | ICD-10-CM | POA: Diagnosis not present

## 2019-04-02 DIAGNOSIS — Z87891 Personal history of nicotine dependence: Secondary | ICD-10-CM | POA: Diagnosis not present

## 2019-04-02 DIAGNOSIS — Z7982 Long term (current) use of aspirin: Secondary | ICD-10-CM | POA: Diagnosis not present

## 2019-04-12 ENCOUNTER — Other Ambulatory Visit: Payer: Self-pay

## 2019-04-12 ENCOUNTER — Ambulatory Visit: Payer: Medicaid Other | Admitting: Podiatry

## 2019-04-12 ENCOUNTER — Encounter: Payer: Self-pay | Admitting: Podiatry

## 2019-04-12 DIAGNOSIS — M79675 Pain in left toe(s): Secondary | ICD-10-CM

## 2019-04-12 DIAGNOSIS — M79674 Pain in right toe(s): Secondary | ICD-10-CM | POA: Diagnosis not present

## 2019-04-12 DIAGNOSIS — B351 Tinea unguium: Secondary | ICD-10-CM | POA: Diagnosis not present

## 2019-04-12 DIAGNOSIS — E1042 Type 1 diabetes mellitus with diabetic polyneuropathy: Secondary | ICD-10-CM

## 2019-04-12 NOTE — Progress Notes (Signed)
Complaint:  Visit Type: Patient returns to my office for continued preventative foot care services. Complaint: Patient states" my nails have grown long and thick and become painful to walk and wear shoes" Patient has been diagnosed with DM with no foot complications. The patient presents for preventative foot care services. No changes to ROS  Podiatric Exam: Vascular: dorsalis pedis and posterior tibial pulses are barely  palpable bilateral. Capillary return is immediate. Temperature gradient is WNL. Skin turgor WNL  Sensorium: Diminished  Semmes Weinstein monofilament test. Normal tactile sensation bilaterally. Nail Exam: Pt has thick disfigured discolored nails with subungual debris noted bilateral entire nail hallux through fifth toenails Ulcer Exam: There is no evidence of ulcer or pre-ulcerative changes or infection. Orthopedic Exam: Muscle tone and strength are WNL. No limitations in general ROM. No crepitus or effusions noted. HAV  B/L.  Skin: No Porokeratosis. No infection or ulcers.  Callus along the lateral heel right foot.  Asymptomatic.  Diagnosis:  Onychomycosis, , Pain in right toe, pain in left toes  Treatment & Plan Procedures and Treatment: Consent by patient was obtained for treatment procedures. The patient understood the discussion of treatment and procedures well. All questions were answered thoroughly reviewed. Debridement of mycotic and hypertrophic toenails, 1 through 5 bilateral and clearing of subungual debris. No ulceration, no infection noted.  Return Visit-Office Procedure: Patient instructed to return to the office for a follow up visit 4 months for continued evaluation and treatment.    Norelle Runnion DPM 

## 2019-04-13 ENCOUNTER — Telehealth: Payer: Self-pay | Admitting: Family

## 2019-04-13 NOTE — Telephone Encounter (Signed)
Home Health Verbal Orders - Caller/Agency: [facility] Number: [phone] Requesting OT/PT/Skilled Nursing/Social Work/Speech Therapy: Nursing Recertification Frequency: 9 additional weeks (med box refills)

## 2019-04-13 NOTE — Telephone Encounter (Signed)
Tried to return call for verbal orders no answer, no voicemail set up [facility] nurse may give verbal as directed

## 2019-04-14 NOTE — Telephone Encounter (Signed)
Verbal has been given to Chelsea Barton.

## 2019-04-29 DIAGNOSIS — R413 Other amnesia: Secondary | ICD-10-CM | POA: Diagnosis not present

## 2019-04-29 DIAGNOSIS — E1039 Type 1 diabetes mellitus with other diabetic ophthalmic complication: Secondary | ICD-10-CM | POA: Diagnosis not present

## 2019-04-29 DIAGNOSIS — Z87891 Personal history of nicotine dependence: Secondary | ICD-10-CM | POA: Diagnosis not present

## 2019-04-29 DIAGNOSIS — H547 Unspecified visual loss: Secondary | ICD-10-CM | POA: Diagnosis not present

## 2019-04-29 DIAGNOSIS — Z9181 History of falling: Secondary | ICD-10-CM | POA: Diagnosis not present

## 2019-04-29 DIAGNOSIS — E785 Hyperlipidemia, unspecified: Secondary | ICD-10-CM | POA: Diagnosis not present

## 2019-04-29 DIAGNOSIS — E1022 Type 1 diabetes mellitus with diabetic chronic kidney disease: Secondary | ICD-10-CM | POA: Diagnosis not present

## 2019-04-29 DIAGNOSIS — I509 Heart failure, unspecified: Secondary | ICD-10-CM | POA: Diagnosis not present

## 2019-04-29 DIAGNOSIS — N189 Chronic kidney disease, unspecified: Secondary | ICD-10-CM | POA: Diagnosis not present

## 2019-04-29 DIAGNOSIS — Z7982 Long term (current) use of aspirin: Secondary | ICD-10-CM | POA: Diagnosis not present

## 2019-04-29 DIAGNOSIS — I2510 Atherosclerotic heart disease of native coronary artery without angina pectoris: Secondary | ICD-10-CM | POA: Diagnosis not present

## 2019-05-14 DIAGNOSIS — I2510 Atherosclerotic heart disease of native coronary artery without angina pectoris: Secondary | ICD-10-CM | POA: Diagnosis not present

## 2019-05-14 DIAGNOSIS — N189 Chronic kidney disease, unspecified: Secondary | ICD-10-CM | POA: Diagnosis not present

## 2019-05-14 DIAGNOSIS — E1022 Type 1 diabetes mellitus with diabetic chronic kidney disease: Secondary | ICD-10-CM | POA: Diagnosis not present

## 2019-05-14 DIAGNOSIS — Z7982 Long term (current) use of aspirin: Secondary | ICD-10-CM | POA: Diagnosis not present

## 2019-05-14 DIAGNOSIS — E785 Hyperlipidemia, unspecified: Secondary | ICD-10-CM | POA: Diagnosis not present

## 2019-05-14 DIAGNOSIS — R413 Other amnesia: Secondary | ICD-10-CM | POA: Diagnosis not present

## 2019-05-14 DIAGNOSIS — H547 Unspecified visual loss: Secondary | ICD-10-CM | POA: Diagnosis not present

## 2019-05-14 DIAGNOSIS — Z87891 Personal history of nicotine dependence: Secondary | ICD-10-CM | POA: Diagnosis not present

## 2019-05-14 DIAGNOSIS — I509 Heart failure, unspecified: Secondary | ICD-10-CM | POA: Diagnosis not present

## 2019-05-14 DIAGNOSIS — Z9181 History of falling: Secondary | ICD-10-CM | POA: Diagnosis not present

## 2019-05-14 DIAGNOSIS — E1039 Type 1 diabetes mellitus with other diabetic ophthalmic complication: Secondary | ICD-10-CM | POA: Diagnosis not present

## 2019-05-18 ENCOUNTER — Ambulatory Visit
Admission: RE | Admit: 2019-05-18 | Discharge: 2019-05-18 | Disposition: A | Discharge status: Home / Self Care | Payer: Medicaid Other | Source: Ambulatory Visit | Attending: Family | Admitting: Family

## 2019-05-18 DIAGNOSIS — Z1239 Encounter for other screening for malignant neoplasm of breast: Secondary | ICD-10-CM

## 2019-05-18 DIAGNOSIS — Z1231 Encounter for screening mammogram for malignant neoplasm of breast: Secondary | ICD-10-CM | POA: Insufficient documentation

## 2019-05-19 DIAGNOSIS — E1042 Type 1 diabetes mellitus with diabetic polyneuropathy: Secondary | ICD-10-CM | POA: Diagnosis not present

## 2019-05-25 ENCOUNTER — Telehealth: Payer: Self-pay | Admitting: Family

## 2019-05-25 NOTE — Telephone Encounter (Signed)
Home Health Verbal Orders - Caller/Agency: [name] [place] Number: [phone] Requesting OT/PT/Skilled Nursing/Social Work/Speech Therapy: Approval for Recert for Med Box fill Frequency: 1 w every other week 9  Requesting a direct phone number for clinic so she does not have to wait to reach the office

## 2019-05-25 NOTE — Telephone Encounter (Signed)
Verbal orders given  

## 2019-05-25 NOTE — Telephone Encounter (Signed)
absolutely

## 2019-05-25 NOTE — Telephone Encounter (Signed)
Is it okay to give verbal orders?  

## 2019-05-26 DIAGNOSIS — E1042 Type 1 diabetes mellitus with diabetic polyneuropathy: Secondary | ICD-10-CM | POA: Diagnosis not present

## 2019-05-26 DIAGNOSIS — E1021 Type 1 diabetes mellitus with diabetic nephropathy: Secondary | ICD-10-CM | POA: Diagnosis not present

## 2019-05-26 DIAGNOSIS — E1029 Type 1 diabetes mellitus with other diabetic kidney complication: Secondary | ICD-10-CM | POA: Diagnosis not present

## 2019-05-26 DIAGNOSIS — E103553 Type 1 diabetes mellitus with stable proliferative diabetic retinopathy, bilateral: Secondary | ICD-10-CM | POA: Diagnosis not present

## 2019-05-26 DIAGNOSIS — R809 Proteinuria, unspecified: Secondary | ICD-10-CM | POA: Diagnosis not present

## 2019-05-27 DIAGNOSIS — Z9181 History of falling: Secondary | ICD-10-CM | POA: Diagnosis not present

## 2019-05-27 DIAGNOSIS — E785 Hyperlipidemia, unspecified: Secondary | ICD-10-CM | POA: Diagnosis not present

## 2019-05-27 DIAGNOSIS — H547 Unspecified visual loss: Secondary | ICD-10-CM | POA: Diagnosis not present

## 2019-05-27 DIAGNOSIS — Z7982 Long term (current) use of aspirin: Secondary | ICD-10-CM | POA: Diagnosis not present

## 2019-05-27 DIAGNOSIS — N189 Chronic kidney disease, unspecified: Secondary | ICD-10-CM | POA: Diagnosis not present

## 2019-05-27 DIAGNOSIS — I509 Heart failure, unspecified: Secondary | ICD-10-CM | POA: Diagnosis not present

## 2019-05-27 DIAGNOSIS — I2510 Atherosclerotic heart disease of native coronary artery without angina pectoris: Secondary | ICD-10-CM | POA: Diagnosis not present

## 2019-05-27 DIAGNOSIS — E1039 Type 1 diabetes mellitus with other diabetic ophthalmic complication: Secondary | ICD-10-CM | POA: Diagnosis not present

## 2019-05-27 DIAGNOSIS — E1022 Type 1 diabetes mellitus with diabetic chronic kidney disease: Secondary | ICD-10-CM | POA: Diagnosis not present

## 2019-05-27 DIAGNOSIS — Z87891 Personal history of nicotine dependence: Secondary | ICD-10-CM | POA: Diagnosis not present

## 2019-05-27 DIAGNOSIS — R413 Other amnesia: Secondary | ICD-10-CM | POA: Diagnosis not present

## 2019-05-31 IMAGING — CT CT CHEST LUNG CANCER SCREENING LOW DOSE W/O CM
2 of 5 series · 15 of 40 positions shown, 18 images · non-contrast
Comparison: 07/31/2008.

CLINICAL DATA: Former smoker, quit 12 years ago, 36 pack-year
history, lung cancer screening.

EXAM:
CT CHEST WITHOUT CONTRAST LOW-DOSE FOR LUNG CANCER SCREENING
TECHNIQUE: Multidetector CT imaging of the chest was performed following the
standard protocol without IV contrast.

[Series 3: lung · axial · 0.72mm/px · z∈[-1196,-926]mm · 12 of 302 slices shown, 15 images (1 of 2)]
[im 16/302  mediastinal]
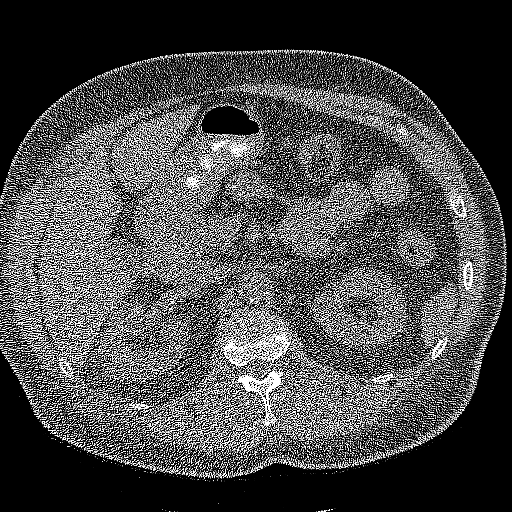
[im 16/302  lung]
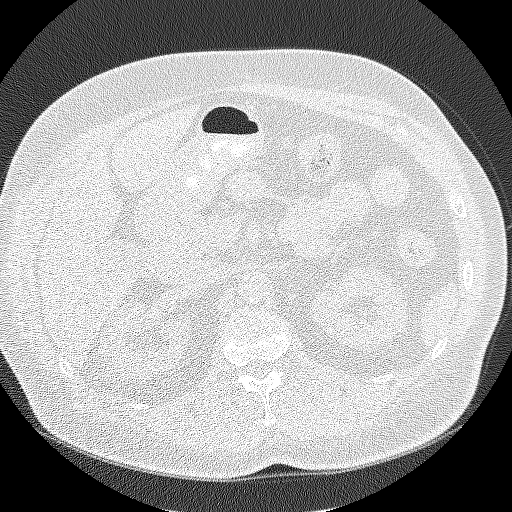
[im 46/302  lung]
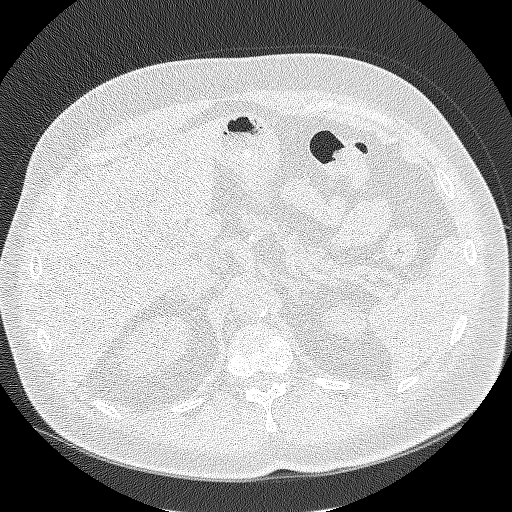
[im 61/302  lung]
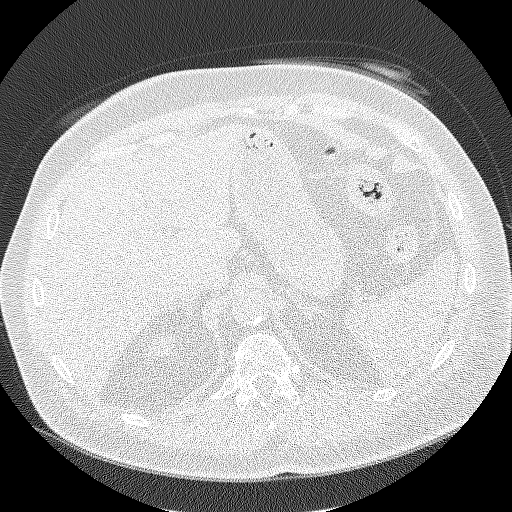
[im 91/302  lung]
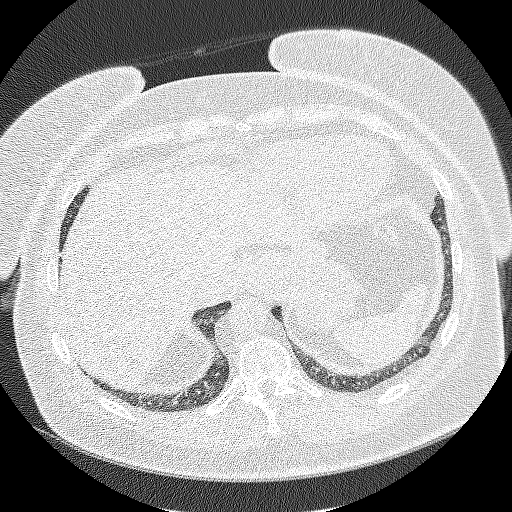
[im 121/302  mediastinal]
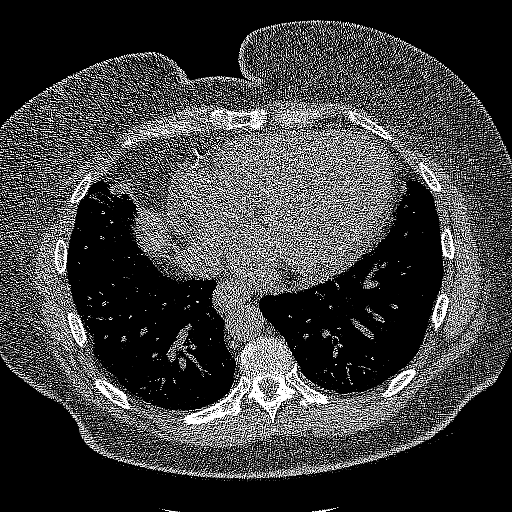
[im 121/302  lung]
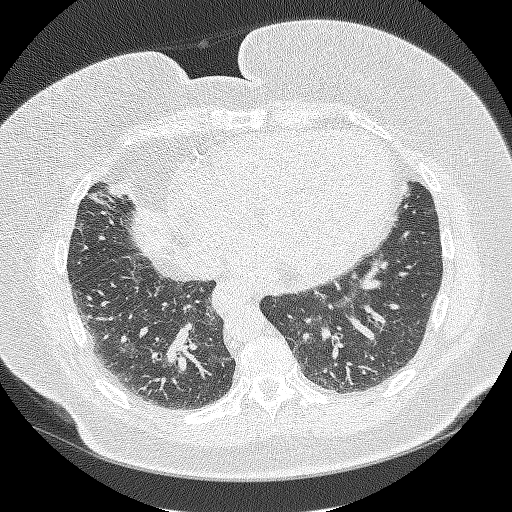
[im 136/302  lung]
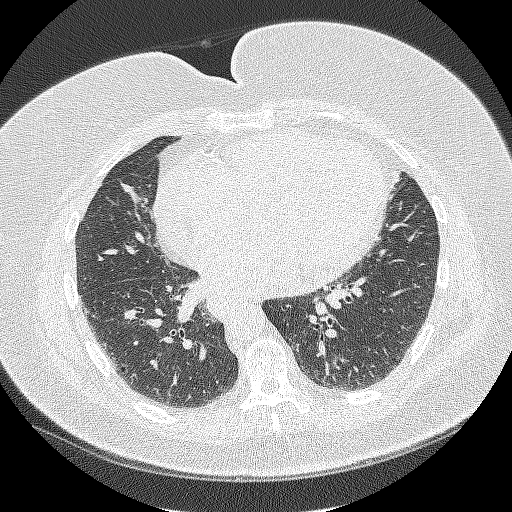
[im 166/302  lung]
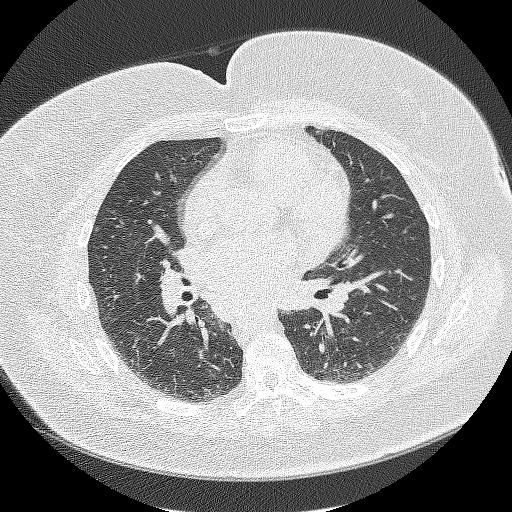
[im 181/302  lung]
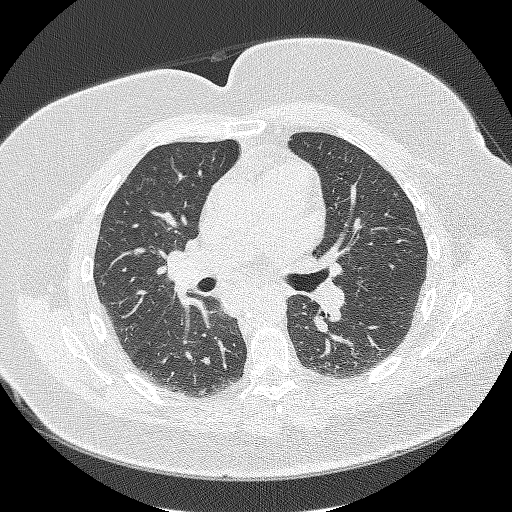
[im 211/302  mediastinal]
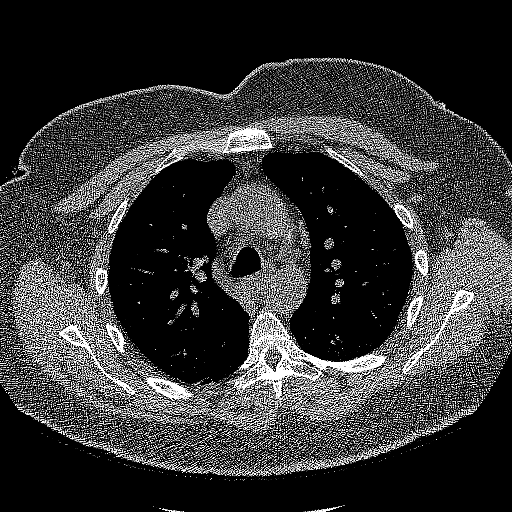
[im 211/302  lung]
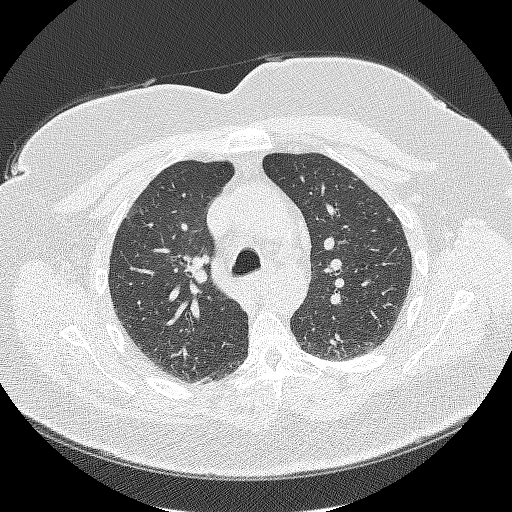
[im 241/302  lung]
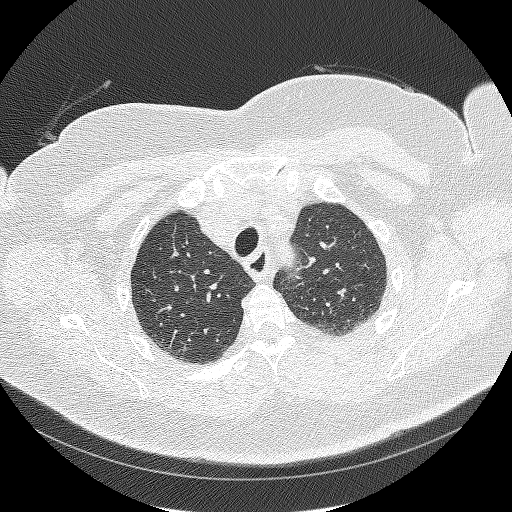
[im 256/302  lung]
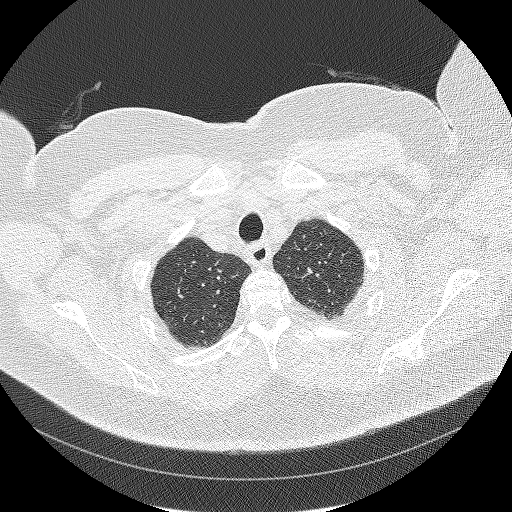
[im 286/302  lung]
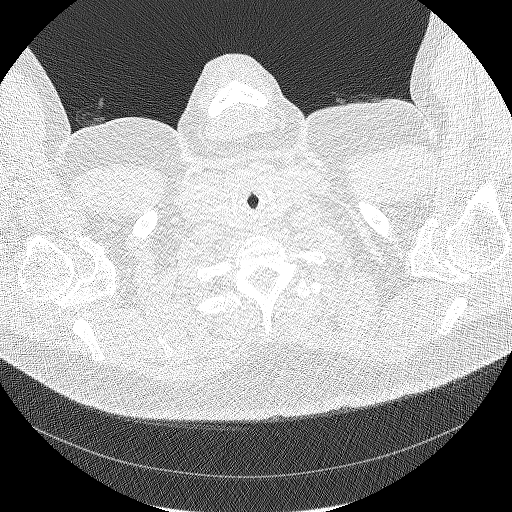

[Series 4: lung · coronal · 0.59mm/px · 3 of 332 slices shown (2 of 2)]
[im 67/332  lung]
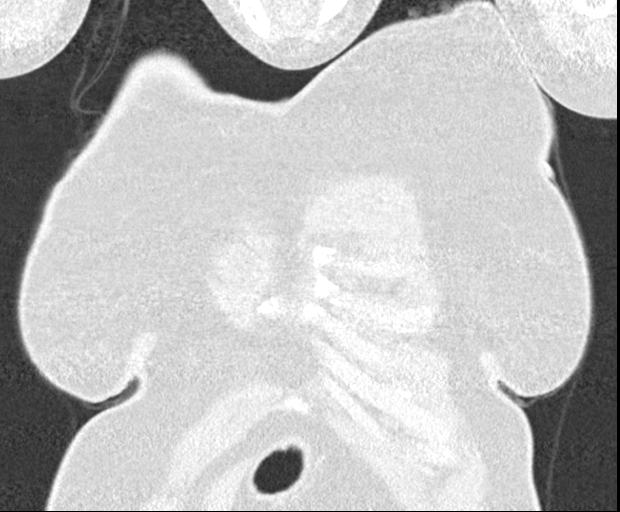
[im 133/332  lung]
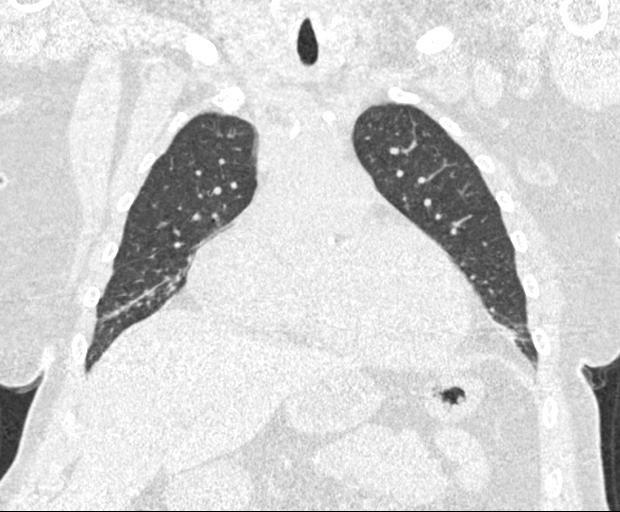
[im 199/332  lung]
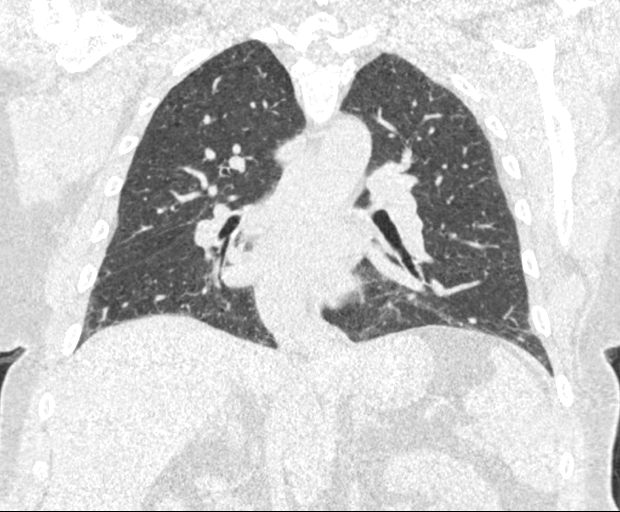

[15 of 40 positions shown; findings below may reference images not displayed]

FINDINGS: Cardiovascular: Atherosclerotic calcification of the arterial
vasculature, including three-vessel involvement of the coronary
arteries. Heart is enlarged. Small amount of dependent pericardial
fluid.

Mediastinum/Nodes: Mediastinal and axillary lymph nodes are not
enlarged by CT are not size criteria. Hilar regions are difficult to
evaluate without IV contrast. Esophagus is grossly unremarkable.

Lungs/Pleura: Mild centrilobular emphysema. Basilar pulmonary
scarring, most notable in the right middle lobe. No worrisome
pulmonary nodules. No pleural fluid. Airway is unremarkable.

Upper Abdomen: Visualized portions of the liver are unremarkable.
There may be a noncalcified stone in the gallbladder, partially
imaged. Visualized portions of the adrenal glands, kidneys, spleen,
pancreas, stomach and bowel are grossly unremarkable with exception
of a tiny hiatal hernia. No upper abdominal adenopathy.

Musculoskeletal: Degenerative changes in the spine. No worrisome
lytic or sclerotic lesions.
IMPRESSION: 1. Lung-RADS 1, negative. Continue annual screening with low-dose
chest CT without contrast in 12 months..
2. Aortic atherosclerosis (6Z55P-170.0). Three-vessel coronary
artery calcification.
3. Small amount of dependent pericardial fluid.
4.  Emphysema (6Z55P-KGE.7).
5. Suspect cholelithiasis.

## 2019-06-10 DIAGNOSIS — I509 Heart failure, unspecified: Secondary | ICD-10-CM | POA: Diagnosis not present

## 2019-06-10 DIAGNOSIS — E1022 Type 1 diabetes mellitus with diabetic chronic kidney disease: Secondary | ICD-10-CM | POA: Diagnosis not present

## 2019-06-10 DIAGNOSIS — H547 Unspecified visual loss: Secondary | ICD-10-CM | POA: Diagnosis not present

## 2019-06-10 DIAGNOSIS — Z9181 History of falling: Secondary | ICD-10-CM | POA: Diagnosis not present

## 2019-06-10 DIAGNOSIS — R413 Other amnesia: Secondary | ICD-10-CM | POA: Diagnosis not present

## 2019-06-10 DIAGNOSIS — E1039 Type 1 diabetes mellitus with other diabetic ophthalmic complication: Secondary | ICD-10-CM | POA: Diagnosis not present

## 2019-06-10 DIAGNOSIS — N189 Chronic kidney disease, unspecified: Secondary | ICD-10-CM | POA: Diagnosis not present

## 2019-06-10 DIAGNOSIS — Z7982 Long term (current) use of aspirin: Secondary | ICD-10-CM | POA: Diagnosis not present

## 2019-06-10 DIAGNOSIS — E785 Hyperlipidemia, unspecified: Secondary | ICD-10-CM | POA: Diagnosis not present

## 2019-06-10 DIAGNOSIS — Z87891 Personal history of nicotine dependence: Secondary | ICD-10-CM | POA: Diagnosis not present

## 2019-06-10 DIAGNOSIS — I2510 Atherosclerotic heart disease of native coronary artery without angina pectoris: Secondary | ICD-10-CM | POA: Diagnosis not present

## 2019-06-17 ENCOUNTER — Telehealth: Payer: Self-pay | Admitting: Family

## 2019-06-17 DIAGNOSIS — E1022 Type 1 diabetes mellitus with diabetic chronic kidney disease: Secondary | ICD-10-CM | POA: Diagnosis not present

## 2019-06-17 DIAGNOSIS — I509 Heart failure, unspecified: Secondary | ICD-10-CM | POA: Diagnosis not present

## 2019-06-17 DIAGNOSIS — Z7982 Long term (current) use of aspirin: Secondary | ICD-10-CM | POA: Diagnosis not present

## 2019-06-17 DIAGNOSIS — R413 Other amnesia: Secondary | ICD-10-CM | POA: Diagnosis not present

## 2019-06-17 DIAGNOSIS — Z9181 History of falling: Secondary | ICD-10-CM | POA: Diagnosis not present

## 2019-06-17 DIAGNOSIS — N189 Chronic kidney disease, unspecified: Secondary | ICD-10-CM | POA: Diagnosis not present

## 2019-06-17 DIAGNOSIS — E1039 Type 1 diabetes mellitus with other diabetic ophthalmic complication: Secondary | ICD-10-CM | POA: Diagnosis not present

## 2019-06-17 DIAGNOSIS — Z87891 Personal history of nicotine dependence: Secondary | ICD-10-CM | POA: Diagnosis not present

## 2019-06-17 DIAGNOSIS — H547 Unspecified visual loss: Secondary | ICD-10-CM | POA: Diagnosis not present

## 2019-06-17 DIAGNOSIS — E785 Hyperlipidemia, unspecified: Secondary | ICD-10-CM | POA: Diagnosis not present

## 2019-06-17 DIAGNOSIS — I2510 Atherosclerotic heart disease of native coronary artery without angina pectoris: Secondary | ICD-10-CM | POA: Diagnosis not present

## 2019-06-17 NOTE — Telephone Encounter (Signed)
I called & gave Chelsea Barton the verbal okay to fill pill box.

## 2019-06-17 NOTE — Telephone Encounter (Signed)
Leigh Aurora, [phone], home health nurse. Requesting a verbal to see pt every other week for a pill box refill.

## 2019-06-24 DIAGNOSIS — E1022 Type 1 diabetes mellitus with diabetic chronic kidney disease: Secondary | ICD-10-CM | POA: Diagnosis not present

## 2019-06-24 DIAGNOSIS — N189 Chronic kidney disease, unspecified: Secondary | ICD-10-CM | POA: Diagnosis not present

## 2019-06-24 DIAGNOSIS — I130 Hypertensive heart and chronic kidney disease with heart failure and stage 1 through stage 4 chronic kidney disease, or unspecified chronic kidney disease: Secondary | ICD-10-CM | POA: Diagnosis not present

## 2019-06-24 DIAGNOSIS — I509 Heart failure, unspecified: Secondary | ICD-10-CM | POA: Diagnosis not present

## 2019-06-24 DIAGNOSIS — R413 Other amnesia: Secondary | ICD-10-CM | POA: Diagnosis not present

## 2019-06-24 DIAGNOSIS — I2510 Atherosclerotic heart disease of native coronary artery without angina pectoris: Secondary | ICD-10-CM | POA: Diagnosis not present

## 2019-06-24 DIAGNOSIS — E1039 Type 1 diabetes mellitus with other diabetic ophthalmic complication: Secondary | ICD-10-CM | POA: Diagnosis not present

## 2019-06-24 DIAGNOSIS — H547 Unspecified visual loss: Secondary | ICD-10-CM | POA: Diagnosis not present

## 2019-06-24 DIAGNOSIS — Z7982 Long term (current) use of aspirin: Secondary | ICD-10-CM | POA: Diagnosis not present

## 2019-06-24 DIAGNOSIS — Z9181 History of falling: Secondary | ICD-10-CM | POA: Diagnosis not present

## 2019-06-24 DIAGNOSIS — Z87891 Personal history of nicotine dependence: Secondary | ICD-10-CM | POA: Diagnosis not present

## 2019-06-25 ENCOUNTER — Ambulatory Visit (INDEPENDENT_AMBULATORY_CARE_PROVIDER_SITE_OTHER): Payer: Medicaid Other | Admitting: Family

## 2019-06-25 ENCOUNTER — Telehealth: Payer: Self-pay | Admitting: Family

## 2019-06-25 ENCOUNTER — Encounter: Payer: Self-pay | Admitting: Family

## 2019-06-25 ENCOUNTER — Other Ambulatory Visit: Payer: Self-pay

## 2019-06-25 DIAGNOSIS — I10 Essential (primary) hypertension: Secondary | ICD-10-CM

## 2019-06-25 DIAGNOSIS — E1039 Type 1 diabetes mellitus with other diabetic ophthalmic complication: Secondary | ICD-10-CM

## 2019-06-25 DIAGNOSIS — K228 Other specified diseases of esophagus: Secondary | ICD-10-CM

## 2019-06-25 DIAGNOSIS — E785 Hyperlipidemia, unspecified: Secondary | ICD-10-CM

## 2019-06-25 DIAGNOSIS — K2289 Other specified disease of esophagus: Secondary | ICD-10-CM

## 2019-06-25 DIAGNOSIS — I059 Rheumatic mitral valve disease, unspecified: Secondary | ICD-10-CM

## 2019-06-25 DIAGNOSIS — I2510 Atherosclerotic heart disease of native coronary artery without angina pectoris: Secondary | ICD-10-CM

## 2019-06-25 MED ORDER — ACCU-CHEK FASTCLIX LANCETS MISC: 0 refills | Status: DC

## 2019-06-25 MED ORDER — ACCU-CHEK AVIVA PLUS VI STRP: ORAL_STRIP | 12 refills | Status: DC

## 2019-06-25 MED ORDER — DIGOXIN 125 MCG PO TABS: 125.0000 ug | ORAL_TABLET | Freq: Every day | ORAL | 1 refills | Status: DC

## 2019-06-25 MED ORDER — ENALAPRIL MALEATE 20 MG PO TABS: 40.0000 mg | ORAL_TABLET | Freq: Every day | ORAL | 2 refills | Status: DC

## 2019-06-25 MED ORDER — SIMVASTATIN 20 MG PO TABS: ORAL_TABLET | ORAL | 1 refills | Status: DC

## 2019-06-25 MED ORDER — CARVEDILOL 12.5 MG PO TABS: ORAL_TABLET | ORAL | 1 refills | Status: DC

## 2019-06-25 MED ORDER — BD PEN NEEDLE SHORT U/F 31G X 8 MM MISC: 6 refills | Status: DC

## 2019-06-25 NOTE — Progress Notes (Signed)
Verbal consent for services obtained from patient prior to services given to TELEPHONE visit:   Location of call:  provider at work patient at home  Names of all persons present for services: Mable Paris, NP Chief complaint:  Feels well , no complaints.   Esophageal thickening seen on CT scan. No trouble swallowing or pain with swallowing. NO epigastric burning or dyspepsia. Was $100 to see GI and she cannot afford that at this time.   HTN- at home 140/70 from nurse visit yesterday; she comes every two weeks. She doesn't have BP machine. She feels improved on healthier diet. Denies exertional chest pain or pressure, numbness or tingling radiating to left arm or jaw, palpitations, dizziness, frequent headaches, changes in vision, or shortness of breath.   Follows with Dr Erin Fulling.   DM 1- follows with endocrine, Dr Gabriel Carina, 23. No hypoglycemic episodes.   Labs  12/ 2/ 2020  LDL 71.   History, background, results pertinent:   Former smoker A/P/next steps: Problem List Items Addressed This Visit      Cardiovascular and Mediastinum   Hypertension    BP Readings from Last 3 Encounters:  06/25/19 140/70  03/18/19 111/71  01/22/19 124/70   Patient does not have blood pressure cuff.  We are calling home health to see if they can come out more frequently over the next couple weeks to monitor blood pressure more closely to ensure we do not need to make any regimen changes.          Digestive   Esophageal thickening    Patient declines GI consult due to cost.  Pleased as patient does not appear to have any gross symptoms at this time.  I sent a message to [facility] in regards to any adjustments in regards to this.  Will follow        Endocrine   DM type 1 (diabetes mellitus, type 1) ([place])    Follows with endocrine, will follow        Other   Hyperlipidemia    At goal, continue regimen          I spent 20 min  discussing plan of care over the phone.

## 2019-06-25 NOTE — Assessment & Plan Note (Signed)
Follows with endocrine , will follow 

## 2019-06-25 NOTE — Assessment & Plan Note (Signed)
At goal, continue regimen. 

## 2019-06-25 NOTE — Assessment & Plan Note (Addendum)
BP Readings from Last 3 Encounters:  06/25/19 140/70  03/18/19 111/71  01/22/19 124/70   Patient does not have blood pressure cuff.  We are calling home health to see if they can come out more frequently over the next couple weeks to monitor blood pressure more closely to ensure we do not need to make any regimen changes.

## 2019-06-25 NOTE — Assessment & Plan Note (Signed)
Patient declines GI consult due to cost.  Pleased as patient does not appear to have any gross symptoms at this time.  I sent a message to [facility] in regards to any adjustments in regards to this.  Will follow

## 2019-06-25 NOTE — Telephone Encounter (Signed)
-----   Message from Minus Liberty, PA-C sent at 06/25/2019  2:24 PM EST ----- It looks like she has medicaid listed in her chart so it should be just a $3 copay, given her abnormal CT scan it shouldn't be an issue if you want to put in a referral. If she doesn't have medicaid anymore I'm not sure what resources are available but could look into it.   Let me know! Thanks!  Have a good weekend!  ----- Message ----- From: Burnard Hawthorne, FNP Sent: 06/25/2019   9:13 AM EST To: Minus Liberty, PA-C  Hi there,   Question for you...   This patient we had referred to GI to be seen after her CT chest showed esophageal thickening.  Per patient, she is unable to afford consult.  This was not sure if there was any adjustment of cost for a patient such as this with limited means.  Fortunately, symptomatically, she appears to have no trouble swallowing or GERD symptoms.  I just wanted to double check on cost.  No rush on this! Thanks Thayer Headings! M

## 2019-06-25 NOTE — Telephone Encounter (Signed)
Sarah,  Call pt In regards to call to seeing GI as I advised patient.  Please see below from GI nurse practitioner Thayer Headings   I think this might be much more affordable than the patient might think.  Melissa, does patient need a new referral for this to [facility] ?  Dr Nicki Reaper placed original one

## 2019-06-28 ENCOUNTER — Encounter: Payer: Self-pay | Admitting: Gastroenterology

## 2019-06-28 NOTE — Telephone Encounter (Signed)
I have placed new referral to [facility].  Please let me know if any issues in scheduling !

## 2019-06-28 NOTE — Telephone Encounter (Signed)
Yes, she will need a new referral for this. The last referral was sent to [facility]. Does she want to go to [facility]?

## 2019-07-01 NOTE — Progress Notes (Signed)
I called [facility] about coming out 2-3 times per week to check BP. Nurse stated said they normally only come out every other week to fill patient's pill box. She said that they did not see her for anything skilled. She said that she would be able to just do the BP check twice a week for two weeks & report back to [name]. I gave verbal order to this.  Also there is a discrepancy in sig. For carvedilol. I refilled but did not notice that it said only once a day. Normally taking twice per day. I called patient to see if she was taking 2 x per day & she is. Quantity of medication was right. She is supposed to be taking twice daily correct? If so I will change in chart.

## 2019-07-02 ENCOUNTER — Encounter: Payer: Self-pay | Admitting: *Deleted

## 2019-07-05 NOTE — Telephone Encounter (Signed)
Scheduled with dr Vicente Males

## 2019-07-06 ENCOUNTER — Telehealth: Payer: Self-pay | Admitting: Family

## 2019-07-06 DIAGNOSIS — I2510 Atherosclerotic heart disease of native coronary artery without angina pectoris: Secondary | ICD-10-CM | POA: Diagnosis not present

## 2019-07-06 DIAGNOSIS — N189 Chronic kidney disease, unspecified: Secondary | ICD-10-CM | POA: Diagnosis not present

## 2019-07-06 DIAGNOSIS — Z9181 History of falling: Secondary | ICD-10-CM | POA: Diagnosis not present

## 2019-07-06 DIAGNOSIS — E1022 Type 1 diabetes mellitus with diabetic chronic kidney disease: Secondary | ICD-10-CM | POA: Diagnosis not present

## 2019-07-06 DIAGNOSIS — H547 Unspecified visual loss: Secondary | ICD-10-CM | POA: Diagnosis not present

## 2019-07-06 DIAGNOSIS — I509 Heart failure, unspecified: Secondary | ICD-10-CM | POA: Diagnosis not present

## 2019-07-06 DIAGNOSIS — Z7982 Long term (current) use of aspirin: Secondary | ICD-10-CM | POA: Diagnosis not present

## 2019-07-06 DIAGNOSIS — R413 Other amnesia: Secondary | ICD-10-CM | POA: Diagnosis not present

## 2019-07-06 DIAGNOSIS — E1039 Type 1 diabetes mellitus with other diabetic ophthalmic complication: Secondary | ICD-10-CM | POA: Diagnosis not present

## 2019-07-06 DIAGNOSIS — I130 Hypertensive heart and chronic kidney disease with heart failure and stage 1 through stage 4 chronic kidney disease, or unspecified chronic kidney disease: Secondary | ICD-10-CM | POA: Diagnosis not present

## 2019-07-06 DIAGNOSIS — Z87891 Personal history of nicotine dependence: Secondary | ICD-10-CM | POA: Diagnosis not present

## 2019-07-06 NOTE — Telephone Encounter (Signed)
Yvone Neu [phone] [facility] to give BP reading. BP for today 144/78

## 2019-07-06 NOTE — Telephone Encounter (Signed)
FYI

## 2019-07-07 NOTE — Telephone Encounter (Signed)
BP Readings from Last 3 Encounters:  06/25/19 140/70  03/18/19 111/71  01/22/19 124/70   Call pt This  blood pressure does not quite seem right.  I reviewed blood pressure last time she was seen by cardiologist, Dr. Nehemiah Massed which was 110/64, and then even in our office which was similar.  If he blood pressures are consistently running in the 140s, that would make reason to make a change to her regimen however I am not so sure.  Can we bring the patient in the next few weeks for an in person follow-up appt so we can check her blood pressure.  She can also bring her blood pressure cuff   Please ensure patient is sitting still for 10 minutes prior to blood pressure being taken

## 2019-07-08 ENCOUNTER — Telehealth: Payer: Self-pay | Admitting: Family

## 2019-07-08 DIAGNOSIS — Z9181 History of falling: Secondary | ICD-10-CM | POA: Diagnosis not present

## 2019-07-08 DIAGNOSIS — N189 Chronic kidney disease, unspecified: Secondary | ICD-10-CM | POA: Diagnosis not present

## 2019-07-08 DIAGNOSIS — E1022 Type 1 diabetes mellitus with diabetic chronic kidney disease: Secondary | ICD-10-CM | POA: Diagnosis not present

## 2019-07-08 DIAGNOSIS — I130 Hypertensive heart and chronic kidney disease with heart failure and stage 1 through stage 4 chronic kidney disease, or unspecified chronic kidney disease: Secondary | ICD-10-CM | POA: Diagnosis not present

## 2019-07-08 DIAGNOSIS — H547 Unspecified visual loss: Secondary | ICD-10-CM | POA: Diagnosis not present

## 2019-07-08 DIAGNOSIS — I2510 Atherosclerotic heart disease of native coronary artery without angina pectoris: Secondary | ICD-10-CM | POA: Diagnosis not present

## 2019-07-08 DIAGNOSIS — I509 Heart failure, unspecified: Secondary | ICD-10-CM | POA: Diagnosis not present

## 2019-07-08 DIAGNOSIS — Z87891 Personal history of nicotine dependence: Secondary | ICD-10-CM | POA: Diagnosis not present

## 2019-07-08 DIAGNOSIS — R413 Other amnesia: Secondary | ICD-10-CM | POA: Diagnosis not present

## 2019-07-08 DIAGNOSIS — Z7982 Long term (current) use of aspirin: Secondary | ICD-10-CM | POA: Diagnosis not present

## 2019-07-08 DIAGNOSIS — I10 Essential (primary) hypertension: Secondary | ICD-10-CM

## 2019-07-08 DIAGNOSIS — E1039 Type 1 diabetes mellitus with other diabetic ophthalmic complication: Secondary | ICD-10-CM | POA: Diagnosis not present

## 2019-07-08 MED ORDER — HYDRALAZINE HCL 10 MG PO TABS: 5.0000 mg | ORAL_TABLET | Freq: Three times a day (TID) | ORAL | 1 refills | Status: DC | PRN

## 2019-07-08 NOTE — Telephone Encounter (Signed)
[name] with [facility] called to advise pt BP reading 190/100-second reading 155/84 No shortness of breath or pain. Please call back at [phone].

## 2019-07-08 NOTE — Telephone Encounter (Signed)
Can you see the hydralazine on her chart?  I sent it in.  It's 5mg  TID prn only if BP > 140/90 Let me know if you don't see

## 2019-07-08 NOTE — Telephone Encounter (Signed)
I do see hydralazine in chart!  I have also called Leigh Aurora from [facility] to let her know about med change. I was unable to reach & LMTCB.

## 2019-07-08 NOTE — Telephone Encounter (Signed)
I called patient & she stated that she feels no different. She does not think that readings are accurate & thought maybe it was the time of day it was being checked or that the cuff wasn't large enough. She denies any CP, SOB, vision changes, jaw pain, dizziness, frequent HA, numbness or tingling in left arm.   I will send in hydralazine just need to know dosing? Then I will call Home Health to let them know directions & use.

## 2019-07-08 NOTE — Telephone Encounter (Signed)
Call Please triage for symptoms - exertional chest pain or pressure, numbness or tingling radiating to left arm or jaw, palpitations, dizziness, frequent headaches, changes in vision, or shortness of breath.   As long as asymptomatic, tell her the following:  Pt I did have a conversation with with Catie pharmD I think PRN hydralazine ONLY when BP is greater than 140/90 is reasonable. This medication is NOT scheduled and she needs to vigilant for feeling dizzy.  I still question if these BP readings are accurate. Does home health use AN ARM CUFF with the appropriate SIZE BLADDER for patient's arm? Is this a RESTING BP?  Please call home health and inform them of above and plan for PRN hydralazinw.

## 2019-07-08 NOTE — Telephone Encounter (Signed)
I have patient scheduled first available 2/2 @ 11:30 for in person visit. Patient stated she is taking medications as prescribed & has not missed any doses.

## 2019-07-09 NOTE — Telephone Encounter (Signed)
noted 

## 2019-07-12 ENCOUNTER — Telehealth: Payer: Self-pay | Admitting: Family

## 2019-07-12 NOTE — Chronic Care Management (AMB) (Signed)
  Care Management   Note  07/12/2019 Name: Chelsea Barton MRN: [id number] DOB: 10/18/1960  Chelsea Barton is a 59 y.o. year old female who is a primary care patient of Burnard Hawthorne, FNP. I reached out to [facility] by phone today in response to a referral sent by Chelsea Barton's health plan.    Chelsea Barton was given information about care management services today including:  1. Care management services include personalized support from designated clinical staff supervised by her physician, including individualized plan of care and coordination with other care providers 2. 24/7 contact phone numbers for assistance for urgent and routine care needs. 3. The patient may stop care management services at any time by phone call to the office staff.  Patient agreed to services and verbal consent obtained.   Follow up plan: Telephone appointment with care management team member scheduled for:08/12/2019  Glenna Durand, LPN Health Advisor, [facility] ??[email or web address] ??[phone]

## 2019-07-13 ENCOUNTER — Telehealth: Payer: Self-pay | Admitting: Family

## 2019-07-13 DIAGNOSIS — Z9181 History of falling: Secondary | ICD-10-CM | POA: Diagnosis not present

## 2019-07-13 DIAGNOSIS — E1039 Type 1 diabetes mellitus with other diabetic ophthalmic complication: Secondary | ICD-10-CM | POA: Diagnosis not present

## 2019-07-13 DIAGNOSIS — I130 Hypertensive heart and chronic kidney disease with heart failure and stage 1 through stage 4 chronic kidney disease, or unspecified chronic kidney disease: Secondary | ICD-10-CM | POA: Diagnosis not present

## 2019-07-13 DIAGNOSIS — R413 Other amnesia: Secondary | ICD-10-CM | POA: Diagnosis not present

## 2019-07-13 DIAGNOSIS — Z7982 Long term (current) use of aspirin: Secondary | ICD-10-CM | POA: Diagnosis not present

## 2019-07-13 DIAGNOSIS — I10 Essential (primary) hypertension: Secondary | ICD-10-CM

## 2019-07-13 DIAGNOSIS — Z87891 Personal history of nicotine dependence: Secondary | ICD-10-CM | POA: Diagnosis not present

## 2019-07-13 DIAGNOSIS — I509 Heart failure, unspecified: Secondary | ICD-10-CM | POA: Diagnosis not present

## 2019-07-13 DIAGNOSIS — I2510 Atherosclerotic heart disease of native coronary artery without angina pectoris: Secondary | ICD-10-CM | POA: Diagnosis not present

## 2019-07-13 DIAGNOSIS — H547 Unspecified visual loss: Secondary | ICD-10-CM | POA: Diagnosis not present

## 2019-07-13 DIAGNOSIS — N189 Chronic kidney disease, unspecified: Secondary | ICD-10-CM | POA: Diagnosis not present

## 2019-07-13 DIAGNOSIS — E1022 Type 1 diabetes mellitus with diabetic chronic kidney disease: Secondary | ICD-10-CM | POA: Diagnosis not present

## 2019-07-13 NOTE — Telephone Encounter (Signed)
Pt's blood pressure readings this AM is 142/72. Pt will call back in a few days with more readings.

## 2019-07-15 ENCOUNTER — Encounter: Payer: Self-pay | Admitting: Family

## 2019-07-15 DIAGNOSIS — E1039 Type 1 diabetes mellitus with other diabetic ophthalmic complication: Secondary | ICD-10-CM | POA: Diagnosis not present

## 2019-07-15 DIAGNOSIS — I2510 Atherosclerotic heart disease of native coronary artery without angina pectoris: Secondary | ICD-10-CM | POA: Diagnosis not present

## 2019-07-15 DIAGNOSIS — I130 Hypertensive heart and chronic kidney disease with heart failure and stage 1 through stage 4 chronic kidney disease, or unspecified chronic kidney disease: Secondary | ICD-10-CM | POA: Diagnosis not present

## 2019-07-15 DIAGNOSIS — I509 Heart failure, unspecified: Secondary | ICD-10-CM | POA: Diagnosis not present

## 2019-07-15 DIAGNOSIS — Z7982 Long term (current) use of aspirin: Secondary | ICD-10-CM | POA: Diagnosis not present

## 2019-07-15 DIAGNOSIS — E1022 Type 1 diabetes mellitus with diabetic chronic kidney disease: Secondary | ICD-10-CM | POA: Diagnosis not present

## 2019-07-15 DIAGNOSIS — N189 Chronic kidney disease, unspecified: Secondary | ICD-10-CM | POA: Diagnosis not present

## 2019-07-15 DIAGNOSIS — R413 Other amnesia: Secondary | ICD-10-CM | POA: Diagnosis not present

## 2019-07-15 DIAGNOSIS — Z9181 History of falling: Secondary | ICD-10-CM | POA: Diagnosis not present

## 2019-07-15 DIAGNOSIS — H547 Unspecified visual loss: Secondary | ICD-10-CM | POA: Diagnosis not present

## 2019-07-15 DIAGNOSIS — Z87891 Personal history of nicotine dependence: Secondary | ICD-10-CM | POA: Diagnosis not present

## 2019-07-15 NOTE — Telephone Encounter (Signed)
Spoke with patient and she wanted to cancel appt on 07/20/19. She said her readings on Jan 26th was 142/79 and on the 27th it was 131/75. She had not checked her blood pressure for today but will let you know in a few days what her results are.

## 2019-07-15 NOTE — Telephone Encounter (Signed)
Call pt Please confirm she is taking coreg, digoxin, enalapril, hydralazine as prescribed   Her blood pressure is generally not at goal. The setting of diabetes, it is very very important for blood pressure to be well controlled.  Guidelines for blood pressure to be less than 120/80.  It Does not look like it she is consistent like that.  She tends to be in the 130s, 140s.   Would she rather follow-up with her Nehemiah Massed in regards to her blood pressure?  I know she is canceled her appointment with [name].  I just think there needs to be some changes to her medication regimen. We can make her an appt with him.   Also concerned I dont  think she is following with Dr. Holley Raring ( nephrology) any longer.  She has severe kidney disease. Would she let us to call his office and make a follow-up for her?

## 2019-07-19 DIAGNOSIS — N189 Chronic kidney disease, unspecified: Secondary | ICD-10-CM

## 2019-07-19 DIAGNOSIS — Z87891 Personal history of nicotine dependence: Secondary | ICD-10-CM

## 2019-07-19 DIAGNOSIS — E1039 Type 1 diabetes mellitus with other diabetic ophthalmic complication: Secondary | ICD-10-CM

## 2019-07-19 DIAGNOSIS — Z9181 History of falling: Secondary | ICD-10-CM

## 2019-07-19 DIAGNOSIS — Z7982 Long term (current) use of aspirin: Secondary | ICD-10-CM

## 2019-07-19 DIAGNOSIS — E1022 Type 1 diabetes mellitus with diabetic chronic kidney disease: Secondary | ICD-10-CM

## 2019-07-19 DIAGNOSIS — I509 Heart failure, unspecified: Secondary | ICD-10-CM

## 2019-07-19 DIAGNOSIS — I2510 Atherosclerotic heart disease of native coronary artery without angina pectoris: Secondary | ICD-10-CM

## 2019-07-19 DIAGNOSIS — R413 Other amnesia: Secondary | ICD-10-CM

## 2019-07-19 DIAGNOSIS — H547 Unspecified visual loss: Secondary | ICD-10-CM

## 2019-07-19 DIAGNOSIS — I130 Hypertensive heart and chronic kidney disease with heart failure and stage 1 through stage 4 chronic kidney disease, or unspecified chronic kidney disease: Secondary | ICD-10-CM

## 2019-07-19 NOTE — Telephone Encounter (Signed)
Called patient on both numbers. LMTCB.

## 2019-07-20 ENCOUNTER — Ambulatory Visit: Payer: Medicaid Other | Admitting: Family

## 2019-07-20 NOTE — Telephone Encounter (Signed)
I spoke with patient & she said that she had taken the hydralazine a few times but it dropped BP too low. She said that last week it was: 102/59 113/59 yesterday 114/66 today  Also made her an appointment with nephrology 08/17/19 @ 1p.

## 2019-07-20 NOTE — Telephone Encounter (Signed)
Circle back here.  See below message and ALSO message from 07/15/19 and review with pt. She needs to nephrology again.  I cant find the note but thought you told me discrepancy as to how she is taking the carvedilol  This medication is supposed to be BID.  Is she taking 12.5 carvedilol BID with meals?  Please send in new rx. This may help with her BP as it is not at goal < 120/80.

## 2019-07-21 MED ORDER — CARVEDILOL 12.5 MG PO TABS: 12.5000 mg | ORAL_TABLET | Freq: Two times a day (BID) | ORAL | 1 refills | Status: DC

## 2019-07-21 NOTE — Addendum Note (Signed)
Addended by: Burnard Hawthorne on: 07/21/2019 12:37 PM   Modules accepted: Orders

## 2019-07-21 NOTE — Telephone Encounter (Signed)
Found your note where patient is taking carvedilol 12.5mg  BID as she should;  I have sent in correct rx for this.  The SBP is acceptable, the DBP on low end.   Hydralazine is  PRN medication only.Ensure she knows that.   If she has episodes of lightheaded or dizziness or feels symptomatic of low blood pressure, please advise f/u appt  With me. Glad she is seeing nephrology

## 2019-07-21 NOTE — Telephone Encounter (Signed)
I spoke with patient & she stated that she did not like the way hydralazine was making her feel. She stated that she was only taking PRN & did not plan on taking it again.   Patient was told & said that she would keep appointment with Catie on 2/25.

## 2019-07-22 ENCOUNTER — Ambulatory Visit (INDEPENDENT_AMBULATORY_CARE_PROVIDER_SITE_OTHER): Payer: Medicaid Other | Admitting: Gastroenterology

## 2019-07-22 ENCOUNTER — Encounter: Payer: Self-pay | Admitting: Gastroenterology

## 2019-07-22 DIAGNOSIS — Z9181 History of falling: Secondary | ICD-10-CM | POA: Diagnosis not present

## 2019-07-22 DIAGNOSIS — R413 Other amnesia: Secondary | ICD-10-CM | POA: Diagnosis not present

## 2019-07-22 DIAGNOSIS — I2510 Atherosclerotic heart disease of native coronary artery without angina pectoris: Secondary | ICD-10-CM | POA: Diagnosis not present

## 2019-07-22 DIAGNOSIS — E1039 Type 1 diabetes mellitus with other diabetic ophthalmic complication: Secondary | ICD-10-CM | POA: Diagnosis not present

## 2019-07-22 DIAGNOSIS — Z87891 Personal history of nicotine dependence: Secondary | ICD-10-CM | POA: Diagnosis not present

## 2019-07-22 DIAGNOSIS — E1022 Type 1 diabetes mellitus with diabetic chronic kidney disease: Secondary | ICD-10-CM | POA: Diagnosis not present

## 2019-07-22 DIAGNOSIS — H547 Unspecified visual loss: Secondary | ICD-10-CM | POA: Diagnosis not present

## 2019-07-22 DIAGNOSIS — Z5329 Procedure and treatment not carried out because of patient's decision for other reasons: Secondary | ICD-10-CM

## 2019-07-22 DIAGNOSIS — I130 Hypertensive heart and chronic kidney disease with heart failure and stage 1 through stage 4 chronic kidney disease, or unspecified chronic kidney disease: Secondary | ICD-10-CM | POA: Diagnosis not present

## 2019-07-22 DIAGNOSIS — K228 Other specified diseases of esophagus: Secondary | ICD-10-CM

## 2019-07-22 DIAGNOSIS — N189 Chronic kidney disease, unspecified: Secondary | ICD-10-CM | POA: Diagnosis not present

## 2019-07-22 DIAGNOSIS — Z7982 Long term (current) use of aspirin: Secondary | ICD-10-CM | POA: Diagnosis not present

## 2019-07-22 DIAGNOSIS — I509 Heart failure, unspecified: Secondary | ICD-10-CM | POA: Diagnosis not present

## 2019-07-22 NOTE — Progress Notes (Signed)
No show x 2.

## 2019-07-26 ENCOUNTER — Ambulatory Visit: Payer: Medicaid Other | Admitting: Gastroenterology

## 2019-08-03 ENCOUNTER — Ambulatory Visit: Payer: Medicaid Other | Admitting: Family

## 2019-08-05 DIAGNOSIS — E1022 Type 1 diabetes mellitus with diabetic chronic kidney disease: Secondary | ICD-10-CM | POA: Diagnosis not present

## 2019-08-05 DIAGNOSIS — N189 Chronic kidney disease, unspecified: Secondary | ICD-10-CM | POA: Diagnosis not present

## 2019-08-05 DIAGNOSIS — H547 Unspecified visual loss: Secondary | ICD-10-CM | POA: Diagnosis not present

## 2019-08-05 DIAGNOSIS — R413 Other amnesia: Secondary | ICD-10-CM | POA: Diagnosis not present

## 2019-08-05 DIAGNOSIS — I509 Heart failure, unspecified: Secondary | ICD-10-CM | POA: Diagnosis not present

## 2019-08-05 DIAGNOSIS — Z87891 Personal history of nicotine dependence: Secondary | ICD-10-CM | POA: Diagnosis not present

## 2019-08-05 DIAGNOSIS — E1039 Type 1 diabetes mellitus with other diabetic ophthalmic complication: Secondary | ICD-10-CM | POA: Diagnosis not present

## 2019-08-05 DIAGNOSIS — I2510 Atherosclerotic heart disease of native coronary artery without angina pectoris: Secondary | ICD-10-CM | POA: Diagnosis not present

## 2019-08-05 DIAGNOSIS — Z7982 Long term (current) use of aspirin: Secondary | ICD-10-CM | POA: Diagnosis not present

## 2019-08-05 DIAGNOSIS — Z9181 History of falling: Secondary | ICD-10-CM | POA: Diagnosis not present

## 2019-08-05 DIAGNOSIS — I130 Hypertensive heart and chronic kidney disease with heart failure and stage 1 through stage 4 chronic kidney disease, or unspecified chronic kidney disease: Secondary | ICD-10-CM | POA: Diagnosis not present

## 2019-08-06 DIAGNOSIS — I340 Nonrheumatic mitral (valve) insufficiency: Secondary | ICD-10-CM | POA: Diagnosis not present

## 2019-08-06 DIAGNOSIS — E782 Mixed hyperlipidemia: Secondary | ICD-10-CM | POA: Diagnosis not present

## 2019-08-06 DIAGNOSIS — I10 Essential (primary) hypertension: Secondary | ICD-10-CM | POA: Diagnosis not present

## 2019-08-06 DIAGNOSIS — I2510 Atherosclerotic heart disease of native coronary artery without angina pectoris: Secondary | ICD-10-CM | POA: Diagnosis not present

## 2019-08-09 ENCOUNTER — Encounter: Payer: Self-pay | Admitting: Podiatry

## 2019-08-09 ENCOUNTER — Ambulatory Visit: Payer: Medicaid Other | Admitting: Podiatry

## 2019-08-09 ENCOUNTER — Other Ambulatory Visit: Payer: Self-pay

## 2019-08-09 DIAGNOSIS — M79674 Pain in right toe(s): Secondary | ICD-10-CM | POA: Diagnosis not present

## 2019-08-09 DIAGNOSIS — E1042 Type 1 diabetes mellitus with diabetic polyneuropathy: Secondary | ICD-10-CM | POA: Diagnosis not present

## 2019-08-09 DIAGNOSIS — B351 Tinea unguium: Secondary | ICD-10-CM | POA: Diagnosis not present

## 2019-08-09 DIAGNOSIS — M79675 Pain in left toe(s): Secondary | ICD-10-CM

## 2019-08-09 NOTE — Progress Notes (Signed)
Complaint:  Visit Type: Patient returns to my office for continued preventative foot care services. Complaint: Patient states" my nails have grown long and thick and become painful to walk and wear shoes" Patient has been diagnosed with DM with no foot complications. The patient presents for preventative foot care services. No changes to ROS  Podiatric Exam: Vascular: dorsalis pedis and posterior tibial pulses are barely  palpable bilateral. Capillary return is immediate. Temperature gradient is WNL. Skin turgor WNL  Sensorium: Diminished  Semmes Weinstein monofilament test. Normal tactile sensation bilaterally. Nail Exam: Pt has thick disfigured discolored nails with subungual debris noted bilateral entire nail hallux through fifth toenails Ulcer Exam: There is no evidence of ulcer or pre-ulcerative changes or infection. Orthopedic Exam: Muscle tone and strength are WNL. No limitations in general ROM. No crepitus or effusions noted. HAV  B/L.  Skin: No Porokeratosis. No infection or ulcers.  Callus along the lateral heel right foot.  Asymptomatic.  Diagnosis:  Onychomycosis, , Pain in right toe, pain in left toes  Treatment & Plan Procedures and Treatment: Consent by patient was obtained for treatment procedures. The patient understood the discussion of treatment and procedures well. All questions were answered thoroughly reviewed. Debridement of mycotic and hypertrophic toenails, 1 through 5 bilateral and clearing of subungual debris. No ulceration, no infection noted.  Return Visit-Office Procedure: Patient instructed to return to the office for a follow up visit 4 months for continued evaluation and treatment.    Karynn Deblasi DPM 

## 2019-08-12 ENCOUNTER — Ambulatory Visit: Payer: Medicaid Other | Admitting: Pharmacist

## 2019-08-12 DIAGNOSIS — I10 Essential (primary) hypertension: Secondary | ICD-10-CM

## 2019-08-12 DIAGNOSIS — E1039 Type 1 diabetes mellitus with other diabetic ophthalmic complication: Secondary | ICD-10-CM

## 2019-08-12 DIAGNOSIS — N189 Chronic kidney disease, unspecified: Secondary | ICD-10-CM

## 2019-08-12 NOTE — Patient Instructions (Signed)
Visit Information  Goals Addressed            This Visit's Progress     Patient Stated   . PharmD "I want to stay healthy" (pt-stated)       CARE PLAN ENTRY (see longtitudinal plan of care for additional care plan information)  Current Barriers:  . Diabetes, type 1; complicated by chronic medical conditions including HTN, HFpEF (recovered), CAD, CKD, most recent A1c 8.0%- follows w/ Dr. Gabriel Carina at [facility] . Most recent eGFR: 35 mL/min (last Scr 1.8 in 05/2019) . Current antihyperglycemic regimen: Lantus 40 units daily, Novolog 10/24/08 units with meals; using DexCom CGM, though notes there was a period of time that she did not have sensors, but this has sense been resolved . Cardiovascular risk reduction: CHF (last EF 55% 2016, though previously <30%); CAD (cath in 2010); last saw Dr. Nehemiah Massed on 08/06/19 o Current hypertensive regimen: carvedilol 12.5 mg BID, enalaprol 40 mg daily, also digoxin 0.125 mg (no digoxin level on file); reports she purchased a BP cuff and has recent home BP readings of 116/70; 122/60; 130/66; 112/65; self d/c hydralazine as she developed severe dizziness after use o Current hyperlipidemia regimen: simvastatin 20 mg daily, last LDL NOT at goal <70 (71 05/2019) o Current antiplatelet regimen: aspirin 81 mg daily   Pharmacist Clinical Goal(s):  Marland Kitchen Over the next 90 days, patient will work with PharmD and primary care provider to address optimized medication management and care coordination  Interventions: . Comprehensive medication review performed, medication list updated in electronic medical record . Reviewed goal SBP <140, possibly ~130. Home readings appear to generally be at goal. Denies s/sx hypotension. Recommended continued home monitoring . As per visit w/ Dr. Nehemiah Massed, continue current regimen. Use of spironolactone limited by renal function. May be able to consider Entresto in the future, given new HFpEF recommendation. Would have low threshold to check digoxin  level to monitor toxicity if renal function continues to decline . Reviewed upcoming appointments w/ nephrology and endocrinology. Patient notes that she was never contacted by GI for the appointment that I see as a No Show, but notes her phone was out for a while previously. Will collaborate w/ office staff to navigate re-setting up this appointment  Patient Self Care Activities: . Patient will check blood glucose at least QID using CGM document, and provide at future appointments . Patient will take medications as prescribed . Patient will report any questions or concerns to provider   Initial goal documentation         Patient verbalizes understanding of instructions provided today.   Plan:  - Scheduled f/u call 09/27/19  [name], PharmD, BCACP, CPP Clinical Pharmacist [place] [phone]

## 2019-08-12 NOTE — Progress Notes (Addendum)
I called patient & she stated that this must be some misunderstanding. There was no GI appointment scheduled & she stated that she was keeping her appointment with nephrology on 3/2 & her appointment with Dr. Gabriel Carina.   Agree with plan. I have also reached out to R. Youngblood to help get patient GI appointment.  Mable Paris, NP

## 2019-08-12 NOTE — Chronic Care Management (AMB) (Signed)
Chronic Care Management   Follow Up Note   08/12/2019 Name: Chelsea Barton MRN: [id number] DOB: 03-01-1961  Referred by: Burnard Hawthorne, FNP Reason for referral : Chronic Care Management (Medication Management)   Chelsea Barton is a 59 y.o. year old female who is a primary care patient of Burnard Hawthorne, FNP. The CCM team was consulted for assistance with chronic disease management and care coordination needs.    Contacted patient for medication management review.  Review of patient status, including review of consultants reports, relevant laboratory and other test results, and collaboration with appropriate care team members and the patient's provider was performed as part of comprehensive patient evaluation and provision of chronic care management services.    SDOH (Social Determinants of Health) assessments performed: No See Care Plan activities for detailed interventions related to [facility])     Outpatient Encounter Medications as of 08/12/2019  Medication Sig  . aspirin 81 MG tablet Take 1 tablet (81 mg total) by mouth daily.  . carvedilol (COREG) 12.5 MG tablet Take 1 tablet (12.5 mg total) by mouth 2 (two) times daily with a meal. Take one tablet by mouth daily with a meal.  . digoxin (LANOXIN) 0.125 MG tablet Take 1 tablet (125 mcg total) by mouth daily.  . enalapril (VASOTEC) 20 MG tablet Take 2 tablets (40 mg total) by mouth daily.  Marland Kitchen glucose blood (ACCU-CHEK AVIVA PLUS) test strip Use as instructed.  . Insulin Glargine (LANTUS SOLOSTAR) 100 UNIT/ML Solostar Pen Inject 40 Units into the skin daily.   . Insulin Pen Needle (B-D ULTRAFINE III SHORT PEN) 31G X 8 MM MISC USE AS DIRECTED  . Insulin Pen Needle (B-D ULTRAFINE III SHORT PEN) 31G X 8 MM MISC USE AS DIRECTED  . Multiple Vitamin (MULTIVITAMIN) capsule Take 1 capsule by mouth daily.    Marland Kitchen NOVOLOG FLEXPEN 100 UNIT/ML FlexPen Inject 25 Units into the skin 3 (three) times daily with meals. Take 5 units at breakfast, 10  units at lunch and 10 units at dinner.  . simvastatin (ZOCOR) 20 MG tablet TAKE 1 TABLET(20 MG) BY MOUTH DAILY AT 6 PM  . vitamin D, CHOLECALCIFEROL, 400 UNITS tablet Take 400 Units by mouth 2 (two) times daily.    . [DISCONTINUED] Accu-Chek FastClix Lancets MISC USE UP TO FOUR TIMES DAILY  . [DISCONTINUED] blood glucose meter kit and supplies KIT Dispense based on patient and insurance preference. Use up to four times daily as directed. (FOR ICD-9 250.00, 250.01).  . [DISCONTINUED] hydrALAZINE (APRESOLINE) 10 MG tablet Take 0.5 tablets (5 mg total) by mouth 3 (three) times daily as needed (Take if blood pressure greater than 140/90).   No facility-administered encounter medications on file as of 08/12/2019.     Objective:   Goals Addressed            This Visit's Progress     Patient Stated   . PharmD "I want to stay healthy" (pt-stated)       CARE PLAN ENTRY (see longtitudinal plan of care for additional care plan information)  Current Barriers:  . Diabetes, type 1; complicated by chronic medical conditions including HTN, HFpEF (recovered), CAD, CKD, most recent A1c 8.0%- follows w/ Dr. Gabriel Carina at [facility] . Most recent eGFR: 35 mL/min (last Scr 1.8 in 05/2019) . Current antihyperglycemic regimen: Lantus 40 units daily, Novolog 10/24/08 units with meals; using DexCom CGM, though notes there was a period of time that she did not have sensors, but this has sense  been resolved . Cardiovascular risk reduction: CHF (last EF 55% 2016, though previously <30%); CAD (cath in 2010); last saw Dr. Nehemiah Massed on 08/06/19 o Current hypertensive regimen: carvedilol 12.5 mg BID, enalaprol 40 mg daily, also digoxin 0.125 mg (no digoxin level on file); reports she purchased a BP cuff and has recent home BP readings of 116/70; 122/60; 130/66; 112/65; self d/c hydralazine as she developed severe dizziness after use o Current hyperlipidemia regimen: simvastatin 20 mg daily, last LDL NOT at goal <70 (71  05/2019) o Current antiplatelet regimen: aspirin 81 mg daily   Pharmacist Clinical Goal(s):  Marland Kitchen Over the next 90 days, patient will work with PharmD and primary care provider to address optimized medication management and care coordination  Interventions: . Comprehensive medication review performed, medication list updated in electronic medical record . Reviewed goal SBP <140, possibly ~130. Home readings appear to generally be at goal. Denies s/sx hypotension. Recommended continued home monitoring . As per visit w/ Dr. Nehemiah Massed, continue current regimen. Use of spironolactone limited by renal function. May be able to consider Entresto in the future, given new HFpEF recommendation. Would have low threshold to check digoxin level to monitor toxicity if renal function continues to decline . Reviewed upcoming appointments w/ nephrology and endocrinology. Patient notes that she was never contacted by GI for the appointment that I see as a No Show, but notes her phone was out for a while previously. Will collaborate w/ office staff to navigate re-setting up this appointment  Patient Self Care Activities: . Patient will check blood glucose at least QID using CGM document, and provide at future appointments . Patient will take medications as prescribed . Patient will report any questions or concerns to provider   Initial goal documentation          Plan:  - Scheduled f/u call 09/27/19  [name], PharmD, BCACP, [place] Pharmacist [place] [phone]

## 2019-08-17 ENCOUNTER — Telehealth: Payer: Self-pay

## 2019-08-17 DIAGNOSIS — R809 Proteinuria, unspecified: Secondary | ICD-10-CM | POA: Insufficient documentation

## 2019-08-17 DIAGNOSIS — N2581 Secondary hyperparathyroidism of renal origin: Secondary | ICD-10-CM | POA: Insufficient documentation

## 2019-08-17 DIAGNOSIS — I130 Hypertensive heart and chronic kidney disease with heart failure and stage 1 through stage 4 chronic kidney disease, or unspecified chronic kidney disease: Secondary | ICD-10-CM | POA: Diagnosis not present

## 2019-08-17 DIAGNOSIS — E1122 Type 2 diabetes mellitus with diabetic chronic kidney disease: Secondary | ICD-10-CM | POA: Diagnosis not present

## 2019-08-17 DIAGNOSIS — E1039 Type 1 diabetes mellitus with other diabetic ophthalmic complication: Secondary | ICD-10-CM | POA: Diagnosis not present

## 2019-08-17 DIAGNOSIS — Z7982 Long term (current) use of aspirin: Secondary | ICD-10-CM | POA: Diagnosis not present

## 2019-08-17 DIAGNOSIS — E1022 Type 1 diabetes mellitus with diabetic chronic kidney disease: Secondary | ICD-10-CM | POA: Diagnosis not present

## 2019-08-17 DIAGNOSIS — I509 Heart failure, unspecified: Secondary | ICD-10-CM | POA: Diagnosis not present

## 2019-08-17 DIAGNOSIS — N189 Chronic kidney disease, unspecified: Secondary | ICD-10-CM | POA: Diagnosis not present

## 2019-08-17 DIAGNOSIS — I129 Hypertensive chronic kidney disease with stage 1 through stage 4 chronic kidney disease, or unspecified chronic kidney disease: Secondary | ICD-10-CM | POA: Diagnosis not present

## 2019-08-17 DIAGNOSIS — H547 Unspecified visual loss: Secondary | ICD-10-CM | POA: Diagnosis not present

## 2019-08-17 DIAGNOSIS — R413 Other amnesia: Secondary | ICD-10-CM | POA: Diagnosis not present

## 2019-08-17 DIAGNOSIS — I2510 Atherosclerotic heart disease of native coronary artery without angina pectoris: Secondary | ICD-10-CM | POA: Diagnosis not present

## 2019-08-17 DIAGNOSIS — Z9181 History of falling: Secondary | ICD-10-CM | POA: Diagnosis not present

## 2019-08-17 DIAGNOSIS — N1832 Chronic kidney disease, stage 3b: Secondary | ICD-10-CM | POA: Diagnosis not present

## 2019-08-17 DIAGNOSIS — Z87891 Personal history of nicotine dependence: Secondary | ICD-10-CM | POA: Diagnosis not present

## 2019-08-17 NOTE — Telephone Encounter (Signed)
I spoke with [name] from [facility] gave verbal orders for her to fill patient's pill box bi-weekly.

## 2019-08-27 DIAGNOSIS — E1042 Type 1 diabetes mellitus with diabetic polyneuropathy: Secondary | ICD-10-CM | POA: Diagnosis not present

## 2019-09-02 DIAGNOSIS — Z9181 History of falling: Secondary | ICD-10-CM | POA: Diagnosis not present

## 2019-09-02 DIAGNOSIS — R413 Other amnesia: Secondary | ICD-10-CM | POA: Diagnosis not present

## 2019-09-02 DIAGNOSIS — I2510 Atherosclerotic heart disease of native coronary artery without angina pectoris: Secondary | ICD-10-CM | POA: Diagnosis not present

## 2019-09-02 DIAGNOSIS — Z7982 Long term (current) use of aspirin: Secondary | ICD-10-CM | POA: Diagnosis not present

## 2019-09-02 DIAGNOSIS — H547 Unspecified visual loss: Secondary | ICD-10-CM | POA: Diagnosis not present

## 2019-09-02 DIAGNOSIS — I509 Heart failure, unspecified: Secondary | ICD-10-CM | POA: Diagnosis not present

## 2019-09-02 DIAGNOSIS — E1039 Type 1 diabetes mellitus with other diabetic ophthalmic complication: Secondary | ICD-10-CM | POA: Diagnosis not present

## 2019-09-02 DIAGNOSIS — I130 Hypertensive heart and chronic kidney disease with heart failure and stage 1 through stage 4 chronic kidney disease, or unspecified chronic kidney disease: Secondary | ICD-10-CM | POA: Diagnosis not present

## 2019-09-02 DIAGNOSIS — E1022 Type 1 diabetes mellitus with diabetic chronic kidney disease: Secondary | ICD-10-CM | POA: Diagnosis not present

## 2019-09-02 DIAGNOSIS — Z87891 Personal history of nicotine dependence: Secondary | ICD-10-CM | POA: Diagnosis not present

## 2019-09-02 DIAGNOSIS — N189 Chronic kidney disease, unspecified: Secondary | ICD-10-CM | POA: Diagnosis not present

## 2019-09-03 DIAGNOSIS — E1029 Type 1 diabetes mellitus with other diabetic kidney complication: Secondary | ICD-10-CM | POA: Diagnosis not present

## 2019-09-03 DIAGNOSIS — E103553 Type 1 diabetes mellitus with stable proliferative diabetic retinopathy, bilateral: Secondary | ICD-10-CM | POA: Diagnosis not present

## 2019-09-03 DIAGNOSIS — E1021 Type 1 diabetes mellitus with diabetic nephropathy: Secondary | ICD-10-CM | POA: Diagnosis not present

## 2019-09-03 DIAGNOSIS — R809 Proteinuria, unspecified: Secondary | ICD-10-CM | POA: Diagnosis not present

## 2019-09-03 DIAGNOSIS — E1042 Type 1 diabetes mellitus with diabetic polyneuropathy: Secondary | ICD-10-CM | POA: Diagnosis not present

## 2019-09-08 DIAGNOSIS — Z23 Encounter for immunization: Secondary | ICD-10-CM | POA: Diagnosis not present

## 2019-09-16 DIAGNOSIS — Z87891 Personal history of nicotine dependence: Secondary | ICD-10-CM | POA: Diagnosis not present

## 2019-09-16 DIAGNOSIS — R413 Other amnesia: Secondary | ICD-10-CM | POA: Diagnosis not present

## 2019-09-16 DIAGNOSIS — E1022 Type 1 diabetes mellitus with diabetic chronic kidney disease: Secondary | ICD-10-CM | POA: Diagnosis not present

## 2019-09-16 DIAGNOSIS — I130 Hypertensive heart and chronic kidney disease with heart failure and stage 1 through stage 4 chronic kidney disease, or unspecified chronic kidney disease: Secondary | ICD-10-CM | POA: Diagnosis not present

## 2019-09-16 DIAGNOSIS — N189 Chronic kidney disease, unspecified: Secondary | ICD-10-CM | POA: Diagnosis not present

## 2019-09-16 DIAGNOSIS — E1039 Type 1 diabetes mellitus with other diabetic ophthalmic complication: Secondary | ICD-10-CM | POA: Diagnosis not present

## 2019-09-16 DIAGNOSIS — I2510 Atherosclerotic heart disease of native coronary artery without angina pectoris: Secondary | ICD-10-CM | POA: Diagnosis not present

## 2019-09-16 DIAGNOSIS — Z9181 History of falling: Secondary | ICD-10-CM | POA: Diagnosis not present

## 2019-09-16 DIAGNOSIS — H547 Unspecified visual loss: Secondary | ICD-10-CM | POA: Diagnosis not present

## 2019-09-16 DIAGNOSIS — I509 Heart failure, unspecified: Secondary | ICD-10-CM | POA: Diagnosis not present

## 2019-09-16 DIAGNOSIS — Z7982 Long term (current) use of aspirin: Secondary | ICD-10-CM | POA: Diagnosis not present

## 2019-09-27 ENCOUNTER — Ambulatory Visit: Payer: Medicaid Other | Admitting: Pharmacist

## 2019-09-27 DIAGNOSIS — N189 Chronic kidney disease, unspecified: Secondary | ICD-10-CM

## 2019-09-27 DIAGNOSIS — E1039 Type 1 diabetes mellitus with other diabetic ophthalmic complication: Secondary | ICD-10-CM

## 2019-09-27 DIAGNOSIS — I10 Essential (primary) hypertension: Secondary | ICD-10-CM

## 2019-09-27 NOTE — Progress Notes (Signed)
Agree with plan. Christabel Camire, NP  

## 2019-09-27 NOTE — Patient Instructions (Addendum)
Chelsea Barton,   It was great talking with you today!  1) See the attached handout regarding treating a low blood sugar. If you feel your low blood sugar episodes become more frequent, please contact Dr. Joycie Peek office to inform her of this.  2) Keep checking your blood pressures and recording these readings. Make sure and take the most recent readings to appointments (with the kidney doctor, heart doctor, and diabetes doctor)  3) Here is how I recommend y'all fill your pill box. The medications with a * can be taken either in the morning or in the evening, whichever works best for your schedule  Morning: - *Aspirin 81 mg daily (stroke/heart disease prevention) - Carvedilol 12.5 mg (blood pressure/heart rate) - *Digoxin 0.125 mg (heart/afib) - *Enalapril 40 mg (two 20 mg tablets)  - *Multivitamin - Vitamin D  Evening: - Carvedilol 12.5 mg (blood pressure/heart rate) - Simvastatin 20 mg (cholesterol) - Vitamin D  Please call with any questions or concerns!  [name], PharmD, [place]  Visit Information  Goals Addressed            This Visit's Progress     Patient Stated   . PharmD "I want to stay healthy" (pt-stated)       CARE PLAN ENTRY (see longtitudinal plan of care for additional care plan information)  Current Barriers:  . Diabetes, type 1; complicated by chronic medical conditions including HTN, HFpEF (recovered), CAD, CKD, most recent A1c 7.0%- follows w/ Dr. Gabriel Carina at [facility], last visit 08/2018 o Reports that she has a nurse come out and help fill her pill box, review glucose and BP readings . Most recent eGFR: 37 mL/min (per Care Everywhere) . Current antihyperglycemic regimen: Lantus 40 units daily, Novolog 10/24/08 units with meals; using DexCom CGM . DOES report episodes of hypoglycemia; patient estimates at ~3 times weekly, but no more frequently than when she last saw Dr. Gabriel Carina. Dr. Joycie Peek last note stated that hypoglycemia was <1% of the  time . Cardiovascular risk reduction: CHF (last EF 55% 2016, though previously <30%); CAD (cath in 2010);  o Current hypertensive regimen: carvedilol 12.5 mg BID, enalapril 40 mg daily, also digoxin 0.125 mg (no digoxin level on file); reports home BP readings 100s-120s/60-70s o Current hyperlipidemia regimen: simvastatin 20 mg daily, last LDL NOT at goal <70 (71 05/2019) o Current antiplatelet regimen: aspirin 81 mg daily   Pharmacist Clinical Goal(s):  Marland Kitchen Over the next 90 days, patient will work with PharmD and primary care provider to address optimized medication management and care coordination  Interventions: . Comprehensive medication review performed, medication list updated in electronic medical record . Inter-disciplinary care team collaboration (see longitudinal plan of care) . Reviewed Rule of 15. Encouraged patient to communicate w/ Dr. Joycie Peek office if she feels episodes of hypoglycemia become more frequent. Mailing patient handout regarding Rule of 15 . Discussed administration times of her medications. Mailing patient an updated medication list of how to fill her pill box to review w/ [place] RN to ensure all parties are up to date.  . Patient noted she needed refills on medications that were prescribed in January. Encouraged her to contact [facility] and speak with someone to review which refills she needed.   Patient Self Care Activities: . Patient will check blood glucose at least QID using CGM document, and provide at future appointments . Patient will take medications as prescribed . Patient will report any questions or concerns to provider   Please see past updates  related to this goal by clicking on the "Past Updates" button in the selected goal          The patient verbalized understanding of instructions provided today and agreed to receive a mailed copy of patient instruction and/or educational materials.  Plan:  - Patient agreeable to continued CCM medication  review. Scheduled f/u call 01/03/20  [name], PharmD, [place], CPP Clinical Pharmacist [place] [phone]

## 2019-09-27 NOTE — Chronic Care Management (AMB) (Signed)
Chronic Care Management   Follow Up Note   09/27/2019 Name: Chelsea Barton MRN: [id number] DOB: 1961/04/15  Referred by: Burnard Hawthorne, FNP Reason for referral : Chronic Care Management (Medication Management)   Chelsea Barton is a 59 y.o. year old female who is a primary care patient of Burnard Hawthorne, FNP. The CCM team was consulted for assistance with chronic disease management and care coordination needs.    Contacted patient for medication management review.  Review of patient status, including review of consultants reports, relevant laboratory and other test results, and collaboration with appropriate care team members and the patient's provider was performed as part of comprehensive patient evaluation and provision of chronic care management services.    SDOH (Social Determinants of Health) assessments performed: No See Care Plan activities for detailed interventions related to [facility])     Outpatient Encounter Medications as of 09/27/2019  Medication Sig  . aspirin 81 MG tablet Take 1 tablet (81 mg total) by mouth daily.  . carvedilol (COREG) 12.5 MG tablet Take 1 tablet (12.5 mg total) by mouth 2 (two) times daily with a meal. Take one tablet by mouth daily with a meal.  . digoxin (LANOXIN) 0.125 MG tablet Take 1 tablet (125 mcg total) by mouth daily.  . enalapril (VASOTEC) 20 MG tablet Take 2 tablets (40 mg total) by mouth daily.  . Insulin Glargine (LANTUS SOLOSTAR) 100 UNIT/ML Solostar Pen Inject 40 Units into the skin daily.   . Insulin Pen Needle (B-D ULTRAFINE III SHORT PEN) 31G X 8 MM MISC USE AS DIRECTED  . Multiple Vitamin (MULTIVITAMIN) capsule Take 1 capsule by mouth daily.    Marland Kitchen NOVOLOG FLEXPEN 100 UNIT/ML FlexPen Inject 25 Units into the skin 3 (three) times daily with meals. Take 5 units at breakfast, 10 units at lunch and 10 units at dinner.  . simvastatin (ZOCOR) 20 MG tablet TAKE 1 TABLET(20 MG) BY MOUTH DAILY AT 6 PM  . vitamin D, CHOLECALCIFEROL, 400  UNITS tablet Take 400 Units by mouth 2 (two) times daily.    . [DISCONTINUED] Insulin Pen Needle (B-D ULTRAFINE III SHORT PEN) 31G X 8 MM MISC USE AS DIRECTED  . [DISCONTINUED] glucose blood (ACCU-CHEK AVIVA PLUS) test strip Use as instructed.   No facility-administered encounter medications on file as of 09/27/2019.     Objective:   Goals Addressed            This Visit's Progress     Patient Stated   . PharmD "I want to stay healthy" (pt-stated)       CARE PLAN ENTRY (see longtitudinal plan of care for additional care plan information)  Current Barriers:  . Diabetes, type 1; complicated by chronic medical conditions including HTN, HFpEF (recovered), CAD, CKD, most recent A1c 7.0%- follows w/ Dr. Gabriel Carina at [facility], last visit 08/2018 o Reports that she has a nurse come out and help fill her pill box, review glucose and BP readings . Most recent eGFR: 37 mL/min (per Care Everywhere) . Current antihyperglycemic regimen: Lantus 40 units daily, Novolog 10/24/08 units with meals; using DexCom CGM . DOES report episodes of hypoglycemia; patient estimates at ~3 times weekly, but no more frequently than when she last saw Dr. Gabriel Carina. Dr. Joycie Peek last note stated that hypoglycemia was <1% of the time . Cardiovascular risk reduction: CHF (last EF 55% 2016, though previously <30%); CAD (cath in 2010);  o Current hypertensive regimen: carvedilol 12.5 mg BID, enalapril 40 mg daily, also digoxin  0.125 mg (no digoxin level on file); reports home BP readings 100s-120s/60-70s o Current hyperlipidemia regimen: simvastatin 20 mg daily, last LDL NOT at goal <70 (71 05/2019) o Current antiplatelet regimen: aspirin 81 mg daily   Pharmacist Clinical Goal(s):  Marland Kitchen Over the next 90 days, patient will work with PharmD and primary care provider to address optimized medication management and care coordination  Interventions: . Comprehensive medication review performed, medication list updated in electronic medical  record . Inter-disciplinary care team collaboration (see longitudinal plan of care) . Reviewed Rule of 15. Encouraged patient to communicate w/ Dr. Joycie Peek office if she feels episodes of hypoglycemia become more frequent. Mailing patient handout regarding Rule of 15 . Discussed administration times of her medications. Mailing patient an updated medication list of how to fill her pill box to review w/ [place] RN to ensure all parties are up to date.  . Patient noted she needed refills on medications that were prescribed in January. Encouraged her to contact [facility] and speak with someone to review which refills she needed.   Patient Self Care Activities: . Patient will check blood glucose at least QID using CGM document, and provide at future appointments . Patient will take medications as prescribed . Patient will report any questions or concerns to provider   Please see past updates related to this goal by clicking on the "Past Updates" button in the selected goal           Plan:  - Patient agreeable to continued CCM medication review. Scheduled f/u call 01/03/20  [name], PharmD, [place], CPP Clinical Pharmacist [place] [phone]

## 2019-09-30 DIAGNOSIS — I509 Heart failure, unspecified: Secondary | ICD-10-CM | POA: Diagnosis not present

## 2019-09-30 DIAGNOSIS — Z87891 Personal history of nicotine dependence: Secondary | ICD-10-CM | POA: Diagnosis not present

## 2019-09-30 DIAGNOSIS — R413 Other amnesia: Secondary | ICD-10-CM | POA: Diagnosis not present

## 2019-09-30 DIAGNOSIS — Z9181 History of falling: Secondary | ICD-10-CM | POA: Diagnosis not present

## 2019-09-30 DIAGNOSIS — E1039 Type 1 diabetes mellitus with other diabetic ophthalmic complication: Secondary | ICD-10-CM | POA: Diagnosis not present

## 2019-09-30 DIAGNOSIS — Z7982 Long term (current) use of aspirin: Secondary | ICD-10-CM | POA: Diagnosis not present

## 2019-09-30 DIAGNOSIS — I130 Hypertensive heart and chronic kidney disease with heart failure and stage 1 through stage 4 chronic kidney disease, or unspecified chronic kidney disease: Secondary | ICD-10-CM | POA: Diagnosis not present

## 2019-09-30 DIAGNOSIS — I2510 Atherosclerotic heart disease of native coronary artery without angina pectoris: Secondary | ICD-10-CM | POA: Diagnosis not present

## 2019-09-30 DIAGNOSIS — H547 Unspecified visual loss: Secondary | ICD-10-CM | POA: Diagnosis not present

## 2019-09-30 DIAGNOSIS — E1022 Type 1 diabetes mellitus with diabetic chronic kidney disease: Secondary | ICD-10-CM | POA: Diagnosis not present

## 2019-09-30 DIAGNOSIS — N189 Chronic kidney disease, unspecified: Secondary | ICD-10-CM | POA: Diagnosis not present

## 2019-10-12 DIAGNOSIS — Z23 Encounter for immunization: Secondary | ICD-10-CM | POA: Diagnosis not present

## 2019-10-13 ENCOUNTER — Ambulatory Visit: Payer: Medicaid Other | Admitting: Family

## 2019-10-13 ENCOUNTER — Other Ambulatory Visit: Payer: Self-pay

## 2019-10-13 ENCOUNTER — Encounter: Payer: Self-pay | Admitting: Family

## 2019-10-13 DIAGNOSIS — I10 Essential (primary) hypertension: Secondary | ICD-10-CM | POA: Diagnosis not present

## 2019-10-13 DIAGNOSIS — K228 Other specified diseases of esophagus: Secondary | ICD-10-CM | POA: Diagnosis not present

## 2019-10-13 DIAGNOSIS — E1039 Type 1 diabetes mellitus with other diabetic ophthalmic complication: Secondary | ICD-10-CM | POA: Diagnosis not present

## 2019-10-13 DIAGNOSIS — K2289 Other specified disease of esophagus: Secondary | ICD-10-CM

## 2019-10-13 NOTE — Patient Instructions (Signed)
Take enalapril at NIGHT  Stop cooking with salt  It is imperative that you are seen AT least twice per year for labs and monitoring. Monitor blood pressure at home and me 5-6 reading on separate days. Goal is less than 120/80, based on newest guidelines, however we certainly want to be less than 130/80;  if persistently higher, please make sooner follow up appointment so we can recheck you blood pressure and manage/ adjust medications.   DASH Eating Plan DASH stands for "Dietary Approaches to Stop Hypertension." The DASH eating plan is a healthy eating plan that has been shown to reduce high blood pressure (hypertension). It may also reduce your risk for type 2 diabetes, heart disease, and stroke. The DASH eating plan may also help with weight loss. What are tips for following this plan?  General guidelines  Avoid eating more than 2,300 mg (milligrams) of salt (sodium) a day. If you have hypertension, you may need to reduce your sodium intake to 1,500 mg a day.  Limit alcohol intake to no more than 1 drink a day for nonpregnant women and 2 drinks a day for men. One drink equals 12 oz of beer, 5 oz of wine, or 1 oz of hard liquor.  Work with your health care provider to maintain a healthy body weight or to lose weight. Ask what an ideal weight is for you.  Get at least 30 minutes of exercise that causes your heart to beat faster (aerobic exercise) most days of the week. Activities may include walking, swimming, or biking.  Work with your health care provider or diet and nutrition specialist (dietitian) to adjust your eating plan to your individual calorie needs. Reading food labels   Check food labels for the amount of sodium per serving. Choose foods with less than 5 percent of the Daily Value of sodium. Generally, foods with less than 300 mg of sodium per serving fit into this eating plan.  To find whole grains, look for the word "whole" as the first word in the ingredient  list. Shopping  Buy products labeled as "low-sodium" or "no salt added."  Buy fresh foods. Avoid canned foods and premade or frozen meals. Cooking  Avoid adding salt when cooking. Use salt-free seasonings or herbs instead of table salt or sea salt. Check with your health care provider or pharmacist before using salt substitutes.  Do not fry foods. Cook foods using healthy methods such as baking, boiling, grilling, and broiling instead.  Cook with heart-healthy oils, such as olive, canola, soybean, or sunflower oil. Meal planning  Eat a balanced diet that includes: ? 5 or more servings of fruits and vegetables each day. At each meal, try to fill half of your plate with fruits and vegetables. ? Up to 6-8 servings of whole grains each day. ? Less than 6 oz of lean meat, poultry, or fish each day. A 3-oz serving of meat is about the same size as a deck of cards. One egg equals 1 oz. ? 2 servings of low-fat dairy each day. ? A serving of nuts, seeds, or beans 5 times each week. ? Heart-healthy fats. Healthy fats called Omega-3 fatty acids are found in foods such as flaxseeds and coldwater fish, like sardines, salmon, and mackerel.  Limit how much you eat of the following: ? Canned or prepackaged foods. ? Food that is high in trans fat, such as fried foods. ? Food that is high in saturated fat, such as fatty meat. ? Sweets, desserts, sugary  drinks, and other foods with added sugar. ? Full-fat dairy products.  Do not salt foods before eating.  Try to eat at least 2 vegetarian meals each week.  Eat more home-cooked food and less restaurant, buffet, and fast food.  When eating at a restaurant, ask that your food be prepared with less salt or no salt, if possible. What foods are recommended? The items listed may not be a complete list. Talk with your dietitian about what dietary choices are best for you. Grains Whole-grain or whole-wheat bread. Whole-grain or whole-wheat pasta. Brown  rice. [place]. Bulgur. Whole-grain and low-sodium cereals. Pita bread. Low-fat, low-sodium crackers. Whole-wheat flour tortillas. Vegetables Fresh or frozen vegetables (raw, steamed, roasted, or grilled). Low-sodium or reduced-sodium tomato and vegetable juice. Low-sodium or reduced-sodium tomato sauce and tomato paste. Low-sodium or reduced-sodium canned vegetables. Fruits All fresh, dried, or frozen fruit. Canned fruit in natural juice (without added sugar). Meat and other protein foods Skinless chicken or [place]. Ground chicken or [place]. Pork with fat trimmed off. Fish and seafood. Egg whites. Dried beans, peas, or lentils. Unsalted nuts, nut butters, and seeds. Unsalted canned beans. Lean cuts of beef with fat trimmed off. Low-sodium, lean deli meat. Dairy Low-fat (1%) or fat-free (skim) milk. Fat-free, low-fat, or reduced-fat cheeses. Nonfat, low-sodium ricotta or cottage cheese. Low-fat or nonfat yogurt. Low-fat, low-sodium cheese. Fats and oils Soft margarine without trans fats. Vegetable oil. Low-fat, reduced-fat, or light mayonnaise and salad dressings (reduced-sodium). Canola, safflower, olive, soybean, and sunflower oils. Avocado. Seasoning and other foods Herbs. Spices. Seasoning mixes without salt. Unsalted popcorn and pretzels. Fat-free sweets. What foods are not recommended? The items listed may not be a complete list. Talk with your dietitian about what dietary choices are best for you. Grains Baked goods made with fat, such as croissants, muffins, or some breads. Dry pasta or rice meal packs. Vegetables Creamed or fried vegetables. Vegetables in a cheese sauce. Regular canned vegetables (not low-sodium or reduced-sodium). Regular canned tomato sauce and paste (not low-sodium or reduced-sodium). Regular tomato and vegetable juice (not low-sodium or reduced-sodium). Angie Fava. Olives. Fruits Canned fruit in a light or heavy syrup. Fried fruit. Fruit in cream or butter  sauce. Meat and other protein foods Fatty cuts of meat. Ribs. Fried meat. Berniece Salines. Sausage. Bologna and other processed lunch meats. Salami. Fatback. Hotdogs. Bratwurst. Salted nuts and seeds. Canned beans with added salt. Canned or smoked fish. Whole eggs or egg yolks. Chicken or [place] with skin. Dairy Whole or 2% milk, cream, and half-and-half. Whole or full-fat cream cheese. Whole-fat or sweetened yogurt. Full-fat cheese. Nondairy creamers. Whipped toppings. Processed cheese and cheese spreads. Fats and oils Butter. Stick margarine. Lard. Shortening. Ghee. Bacon fat. Tropical oils, such as coconut, palm kernel, or palm oil. Seasoning and other foods Salted popcorn and pretzels. Onion salt, garlic salt, seasoned salt, table salt, and sea salt. Worcestershire sauce. Tartar sauce. Barbecue sauce. Teriyaki sauce. Soy sauce, including reduced-sodium. Steak sauce. Canned and packaged gravies. Fish sauce. Oyster sauce. Cocktail sauce. Horseradish that you find on the shelf. Ketchup. Mustard. Meat flavorings and tenderizers. Bouillon cubes. Hot sauce and Tabasco sauce. Premade or packaged marinades. Premade or packaged taco seasonings. Relishes. Regular salad dressings. Where to find more information:  National Heart, Lung, and [place]: https://wilson-eaton.com/  American Heart Association: www.heart.org Summary  The DASH eating plan is a healthy eating plan that has been shown to reduce high blood pressure (hypertension). It may also reduce your risk for type 2 diabetes, heart disease, and  stroke.  With the DASH eating plan, you should limit salt (sodium) intake to 2,300 mg a day. If you have hypertension, you may need to reduce your sodium intake to 1,500 mg a day.  When on the DASH eating plan, aim to eat more fresh fruits and vegetables, whole grains, lean proteins, low-fat dairy, and heart-healthy fats.  Work with your health care provider or diet and nutrition specialist (dietitian) to adjust  your eating plan to your individual calorie needs. This information is not intended to replace advice given to you by your health care provider. Make sure you discuss any questions you have with your health care provider. Document Revised: 05/16/2017 Document Reviewed: 05/27/2016 Elsevier Patient Education  2020 [employer].

## 2019-10-13 NOTE — Assessment & Plan Note (Signed)
Slight elevated today.  Blood pressure is labile at home.  Patient will keep blood pressure log at home after starting enlapril in the evening, with several readings ; may trial something like losartan to see if more effective and stop enlapril.  Alternatively, may consider hydralazine. Will consult [place].

## 2019-10-13 NOTE — Assessment & Plan Note (Signed)
Again discussed this finding with patient and my advice for GI consult. She understands that the consult with GI for evaluation of malignancy is in part why have advised her to have this consult multiple times.  She is very well aware of this and declines.  She will  monitor for symptoms

## 2019-10-13 NOTE — Progress Notes (Signed)
Subjective:    Patient ID: Chelsea Barton, female    DOB: 24-Apr-1961, 59 y.o.   MRN: [id number]  CC: Chelsea Barton is a 59 y.o. female who presents today for follow up.   HPI: Feels well today No concerns  HTN- at home 150/70, 130/60, 113/60, 120/50. Thinks increased BP from potatoes chips.  No nsaids. Snores on occasion.  Denies exertional chest pain or pressure, numbness or tingling radiating to left arm or jaw, palpitations, dizziness, frequent headaches, changes in vision, or shortness of breath.    DM 1- following with Dr Gabriel Carina CAD- follows with Dr Nehemiah Massed CKD- Dr Juleen China Esophageal thickening- Never saw GI. No trouble swallowing and declines further work up in regards to esophageal thickening. No dyspepsia.  HISTORY:  Past Medical History:  Diagnosis Date  . Diabetes mellitus   . Hyperlipidemia   . Hypertension    History reviewed. No pertinent surgical history. Family History  Problem Relation Age of Onset  . Diabetes Sister        3/5 sisters have DM  . Breast cancer Neg Hx   . Colon cancer Neg Hx     Allergies: Patient has no known allergies. Current Outpatient Medications on File Prior to Visit  Medication Sig Dispense Refill  . aspirin 81 MG tablet Take 1 tablet (81 mg total) by mouth daily. 30 tablet 6  . carvedilol (COREG) 12.5 MG tablet Take 1 tablet (12.5 mg total) by mouth 2 (two) times daily with a meal. Take one tablet by mouth daily with a meal. 180 tablet 1  . digoxin (LANOXIN) 0.125 MG tablet Take 1 tablet (125 mcg total) by mouth daily. 90 tablet 1  . enalapril (VASOTEC) 20 MG tablet Take 2 tablets (40 mg total) by mouth daily. 90 tablet 2  . Insulin Glargine (LANTUS SOLOSTAR) 100 UNIT/ML Solostar Pen Inject 40 Units into the skin daily.     . Insulin Pen Needle (B-D ULTRAFINE III SHORT PEN) 31G X 8 MM MISC USE AS DIRECTED 100 each 6  . Multiple Vitamin (MULTIVITAMIN) capsule Take 1 capsule by mouth daily.      Marland Kitchen NOVOLOG FLEXPEN 100 UNIT/ML  FlexPen Inject 25 Units into the skin 3 (three) times daily with meals. Take 5 units at breakfast, 10 units at lunch and 10 units at dinner.    . simvastatin (ZOCOR) 20 MG tablet TAKE 1 TABLET(20 MG) BY MOUTH DAILY AT 6 PM 90 tablet 1  . vitamin D, CHOLECALCIFEROL, 400 UNITS tablet Take 400 Units by mouth 2 (two) times daily.       No current facility-administered medications on file prior to visit.    Social History   Tobacco Use  . Smoking status: Former Smoker    Quit date: 06/18/2005    Years since quitting: 14.3  . Smokeless tobacco: Never Used  Substance Use Topics  . Alcohol use: No  . Drug use: No    Review of Systems  Constitutional: Negative for chills and fever.  Respiratory: Negative for cough.   Cardiovascular: Negative for chest pain and palpitations.  Gastrointestinal: Negative for nausea and vomiting.      Objective:    BP 140/76   Pulse 66   Temp (!) 96.4 F (35.8 C) (Temporal)   Ht 5\' 5"  (1.651 m)   Wt 207 lb (93.9 kg)   SpO2 95%   BMI 34.45 kg/m  BP Readings from Last 3 Encounters:  10/13/19 140/76  06/25/19 140/70  03/18/19 111/71  Wt Readings from Last 3 Encounters:  10/13/19 207 lb (93.9 kg)  06/25/19 209 lb (94.8 kg)  03/18/19 209 lb (94.8 kg)    Physical Exam Vitals reviewed.  Constitutional:      Appearance: She is well-developed.  Eyes:     Conjunctiva/sclera: Conjunctivae normal.  Cardiovascular:     Rate and Rhythm: Normal rate and regular rhythm.     Pulses: Normal pulses.     Heart sounds: Normal heart sounds.  Pulmonary:     Effort: Pulmonary effort is normal.     Breath sounds: Normal breath sounds. No wheezing, rhonchi or rales.  Skin:    General: Skin is warm and dry.  Neurological:     Mental Status: She is alert.  Psychiatric:        Speech: Speech normal.        Behavior: Behavior normal.        Thought Content: Thought content normal.        Assessment & Plan:   Problem List Items Addressed This Visit        Cardiovascular and Mediastinum   Hypertension    Slight elevated today.  Blood pressure is labile at home.  Patient will keep blood pressure log at home after starting enlapril in the evening, with several readings ; may trial something like losartan to see if more effective and stop enlapril.  Alternatively, may consider hydralazine. Will consult [place].        Digestive   Esophageal thickening    Again discussed this finding with patient and my advice for GI consult. She understands that the consult with GI for evaluation of malignancy is in part why have advised her to have this consult multiple times.  She is very well aware of this and declines.  She will  monitor for symptoms        Endocrine   DM type 1 (diabetes mellitus, type 1) (HCC)    Follow Dr. Gabriel Carina. Will follow.          I am having Franci D. Mcmenamy maintain her multivitamin, vitamin D (CHOLECALCIFEROL), aspirin, NovoLOG FlexPen, insulin glargine, simvastatin, B-D ULTRAFINE III SHORT PEN, enalapril, digoxin, and carvedilol.   No orders of the defined types were placed in this encounter.   Return precautions given.   Risks, benefits, and alternatives of the medications and treatment plan prescribed today were discussed, and patient expressed understanding.   Education regarding symptom management and diagnosis given to patient on AVS.  Continue to follow with Burnard Hawthorne, FNP for routine health maintenance.   Stephine D Patella and I agreed with plan.   Mable Paris, FNP

## 2019-10-13 NOTE — Assessment & Plan Note (Signed)
Follow Dr. Gabriel Carina. Will follow.

## 2019-10-14 DIAGNOSIS — E1039 Type 1 diabetes mellitus with other diabetic ophthalmic complication: Secondary | ICD-10-CM | POA: Diagnosis not present

## 2019-10-14 DIAGNOSIS — I509 Heart failure, unspecified: Secondary | ICD-10-CM | POA: Diagnosis not present

## 2019-10-14 DIAGNOSIS — I130 Hypertensive heart and chronic kidney disease with heart failure and stage 1 through stage 4 chronic kidney disease, or unspecified chronic kidney disease: Secondary | ICD-10-CM | POA: Diagnosis not present

## 2019-10-14 DIAGNOSIS — Z87891 Personal history of nicotine dependence: Secondary | ICD-10-CM | POA: Diagnosis not present

## 2019-10-14 DIAGNOSIS — Z7982 Long term (current) use of aspirin: Secondary | ICD-10-CM | POA: Diagnosis not present

## 2019-10-14 DIAGNOSIS — Z9181 History of falling: Secondary | ICD-10-CM | POA: Diagnosis not present

## 2019-10-14 DIAGNOSIS — N189 Chronic kidney disease, unspecified: Secondary | ICD-10-CM | POA: Diagnosis not present

## 2019-10-14 DIAGNOSIS — H547 Unspecified visual loss: Secondary | ICD-10-CM | POA: Diagnosis not present

## 2019-10-14 DIAGNOSIS — R413 Other amnesia: Secondary | ICD-10-CM | POA: Diagnosis not present

## 2019-10-14 DIAGNOSIS — E1022 Type 1 diabetes mellitus with diabetic chronic kidney disease: Secondary | ICD-10-CM | POA: Diagnosis not present

## 2019-10-14 DIAGNOSIS — I2510 Atherosclerotic heart disease of native coronary artery without angina pectoris: Secondary | ICD-10-CM | POA: Diagnosis not present

## 2019-10-28 DIAGNOSIS — Z87891 Personal history of nicotine dependence: Secondary | ICD-10-CM | POA: Diagnosis not present

## 2019-10-28 DIAGNOSIS — N189 Chronic kidney disease, unspecified: Secondary | ICD-10-CM | POA: Diagnosis not present

## 2019-10-28 DIAGNOSIS — I2510 Atherosclerotic heart disease of native coronary artery without angina pectoris: Secondary | ICD-10-CM | POA: Diagnosis not present

## 2019-10-28 DIAGNOSIS — I130 Hypertensive heart and chronic kidney disease with heart failure and stage 1 through stage 4 chronic kidney disease, or unspecified chronic kidney disease: Secondary | ICD-10-CM | POA: Diagnosis not present

## 2019-10-28 DIAGNOSIS — E1022 Type 1 diabetes mellitus with diabetic chronic kidney disease: Secondary | ICD-10-CM | POA: Diagnosis not present

## 2019-10-28 DIAGNOSIS — Z7982 Long term (current) use of aspirin: Secondary | ICD-10-CM | POA: Diagnosis not present

## 2019-10-28 DIAGNOSIS — H547 Unspecified visual loss: Secondary | ICD-10-CM | POA: Diagnosis not present

## 2019-10-28 DIAGNOSIS — I509 Heart failure, unspecified: Secondary | ICD-10-CM | POA: Diagnosis not present

## 2019-10-28 DIAGNOSIS — E1039 Type 1 diabetes mellitus with other diabetic ophthalmic complication: Secondary | ICD-10-CM | POA: Diagnosis not present

## 2019-10-28 DIAGNOSIS — R413 Other amnesia: Secondary | ICD-10-CM | POA: Diagnosis not present

## 2019-10-28 DIAGNOSIS — Z9181 History of falling: Secondary | ICD-10-CM | POA: Diagnosis not present

## 2019-11-11 DIAGNOSIS — Z7982 Long term (current) use of aspirin: Secondary | ICD-10-CM | POA: Diagnosis not present

## 2019-11-11 DIAGNOSIS — Z9181 History of falling: Secondary | ICD-10-CM | POA: Diagnosis not present

## 2019-11-11 DIAGNOSIS — N189 Chronic kidney disease, unspecified: Secondary | ICD-10-CM | POA: Diagnosis not present

## 2019-11-11 DIAGNOSIS — E1022 Type 1 diabetes mellitus with diabetic chronic kidney disease: Secondary | ICD-10-CM | POA: Diagnosis not present

## 2019-11-11 DIAGNOSIS — E1039 Type 1 diabetes mellitus with other diabetic ophthalmic complication: Secondary | ICD-10-CM | POA: Diagnosis not present

## 2019-11-11 DIAGNOSIS — I2510 Atherosclerotic heart disease of native coronary artery without angina pectoris: Secondary | ICD-10-CM | POA: Diagnosis not present

## 2019-11-11 DIAGNOSIS — R413 Other amnesia: Secondary | ICD-10-CM | POA: Diagnosis not present

## 2019-11-11 DIAGNOSIS — I130 Hypertensive heart and chronic kidney disease with heart failure and stage 1 through stage 4 chronic kidney disease, or unspecified chronic kidney disease: Secondary | ICD-10-CM | POA: Diagnosis not present

## 2019-11-11 DIAGNOSIS — H547 Unspecified visual loss: Secondary | ICD-10-CM | POA: Diagnosis not present

## 2019-11-11 DIAGNOSIS — I509 Heart failure, unspecified: Secondary | ICD-10-CM | POA: Diagnosis not present

## 2019-11-11 DIAGNOSIS — Z87891 Personal history of nicotine dependence: Secondary | ICD-10-CM | POA: Diagnosis not present

## 2019-11-17 DIAGNOSIS — N2581 Secondary hyperparathyroidism of renal origin: Secondary | ICD-10-CM | POA: Diagnosis not present

## 2019-11-17 DIAGNOSIS — I129 Hypertensive chronic kidney disease with stage 1 through stage 4 chronic kidney disease, or unspecified chronic kidney disease: Secondary | ICD-10-CM | POA: Diagnosis not present

## 2019-11-17 DIAGNOSIS — E1122 Type 2 diabetes mellitus with diabetic chronic kidney disease: Secondary | ICD-10-CM | POA: Diagnosis not present

## 2019-11-17 DIAGNOSIS — R809 Proteinuria, unspecified: Secondary | ICD-10-CM | POA: Diagnosis not present

## 2019-11-17 DIAGNOSIS — N1832 Chronic kidney disease, stage 3b: Secondary | ICD-10-CM | POA: Diagnosis not present

## 2019-11-25 DIAGNOSIS — Z9181 History of falling: Secondary | ICD-10-CM | POA: Diagnosis not present

## 2019-11-25 DIAGNOSIS — Z87891 Personal history of nicotine dependence: Secondary | ICD-10-CM | POA: Diagnosis not present

## 2019-11-25 DIAGNOSIS — I130 Hypertensive heart and chronic kidney disease with heart failure and stage 1 through stage 4 chronic kidney disease, or unspecified chronic kidney disease: Secondary | ICD-10-CM | POA: Diagnosis not present

## 2019-11-25 DIAGNOSIS — I509 Heart failure, unspecified: Secondary | ICD-10-CM | POA: Diagnosis not present

## 2019-11-25 DIAGNOSIS — N189 Chronic kidney disease, unspecified: Secondary | ICD-10-CM | POA: Diagnosis not present

## 2019-11-25 DIAGNOSIS — I2510 Atherosclerotic heart disease of native coronary artery without angina pectoris: Secondary | ICD-10-CM | POA: Diagnosis not present

## 2019-11-25 DIAGNOSIS — E1022 Type 1 diabetes mellitus with diabetic chronic kidney disease: Secondary | ICD-10-CM | POA: Diagnosis not present

## 2019-11-25 DIAGNOSIS — H547 Unspecified visual loss: Secondary | ICD-10-CM | POA: Diagnosis not present

## 2019-11-25 DIAGNOSIS — E1039 Type 1 diabetes mellitus with other diabetic ophthalmic complication: Secondary | ICD-10-CM | POA: Diagnosis not present

## 2019-11-25 DIAGNOSIS — Z7982 Long term (current) use of aspirin: Secondary | ICD-10-CM | POA: Diagnosis not present

## 2019-11-25 DIAGNOSIS — R413 Other amnesia: Secondary | ICD-10-CM | POA: Diagnosis not present

## 2019-12-09 ENCOUNTER — Ambulatory Visit: Payer: Medicaid Other | Admitting: Podiatry

## 2019-12-09 DIAGNOSIS — I2510 Atherosclerotic heart disease of native coronary artery without angina pectoris: Secondary | ICD-10-CM | POA: Diagnosis not present

## 2019-12-09 DIAGNOSIS — I509 Heart failure, unspecified: Secondary | ICD-10-CM | POA: Diagnosis not present

## 2019-12-09 DIAGNOSIS — E1039 Type 1 diabetes mellitus with other diabetic ophthalmic complication: Secondary | ICD-10-CM | POA: Diagnosis not present

## 2019-12-09 DIAGNOSIS — N189 Chronic kidney disease, unspecified: Secondary | ICD-10-CM | POA: Diagnosis not present

## 2019-12-09 DIAGNOSIS — E1022 Type 1 diabetes mellitus with diabetic chronic kidney disease: Secondary | ICD-10-CM | POA: Diagnosis not present

## 2019-12-09 DIAGNOSIS — Z87891 Personal history of nicotine dependence: Secondary | ICD-10-CM | POA: Diagnosis not present

## 2019-12-09 DIAGNOSIS — R413 Other amnesia: Secondary | ICD-10-CM | POA: Diagnosis not present

## 2019-12-09 DIAGNOSIS — H547 Unspecified visual loss: Secondary | ICD-10-CM | POA: Diagnosis not present

## 2019-12-09 DIAGNOSIS — Z9181 History of falling: Secondary | ICD-10-CM | POA: Diagnosis not present

## 2019-12-09 DIAGNOSIS — I130 Hypertensive heart and chronic kidney disease with heart failure and stage 1 through stage 4 chronic kidney disease, or unspecified chronic kidney disease: Secondary | ICD-10-CM | POA: Diagnosis not present

## 2019-12-09 DIAGNOSIS — Z7982 Long term (current) use of aspirin: Secondary | ICD-10-CM | POA: Diagnosis not present

## 2019-12-15 ENCOUNTER — Telehealth: Payer: Self-pay | Admitting: Family

## 2019-12-15 NOTE — Telephone Encounter (Signed)
Pt called in and stated that she need a new nurse before next Thursday the other one is leaving.

## 2019-12-16 NOTE — Telephone Encounter (Signed)
Can you help with this.  If she needs a new nurse and has home health arranged, I would think the home health agency would replace the one coming out now.  Let me know if I need to do anything.

## 2019-12-16 NOTE — Telephone Encounter (Signed)
Called and spoke to Chelsea Barton. She states that her [facility] nurse is leaving next Thursday and will not be able to come and fill her pill box. She needs a new nurse to come and fill her pill box. She was told to ask for a nurse through "[employer].

## 2019-12-17 DIAGNOSIS — R809 Proteinuria, unspecified: Secondary | ICD-10-CM | POA: Diagnosis not present

## 2019-12-17 DIAGNOSIS — E1021 Type 1 diabetes mellitus with diabetic nephropathy: Secondary | ICD-10-CM | POA: Diagnosis not present

## 2019-12-17 DIAGNOSIS — E103553 Type 1 diabetes mellitus with stable proliferative diabetic retinopathy, bilateral: Secondary | ICD-10-CM | POA: Diagnosis not present

## 2019-12-17 DIAGNOSIS — E1042 Type 1 diabetes mellitus with diabetic polyneuropathy: Secondary | ICD-10-CM | POA: Diagnosis not present

## 2019-12-17 DIAGNOSIS — E1029 Type 1 diabetes mellitus with other diabetic kidney complication: Secondary | ICD-10-CM | POA: Diagnosis not present

## 2019-12-17 NOTE — Telephone Encounter (Signed)
Spoke to [place] over what is needed for [facility] to send over a new nurse. Was informed that [facility] will send over a nurse without our input as we have received no contact as of yet for orders to refill the patients pill box. Called and informed the patient and she had no further questions.

## 2019-12-23 ENCOUNTER — Other Ambulatory Visit: Payer: Self-pay | Admitting: Family

## 2019-12-23 DIAGNOSIS — I10 Essential (primary) hypertension: Secondary | ICD-10-CM

## 2019-12-23 DIAGNOSIS — E785 Hyperlipidemia, unspecified: Secondary | ICD-10-CM

## 2019-12-27 NOTE — Telephone Encounter (Signed)
Received a call back from [facility] and was told that the patient was discharged from their services on 12/09/19. I was transferred to the [facility] center for [place] and requested the discharge notes to be faxed over to our office.

## 2019-12-27 NOTE — Telephone Encounter (Signed)
Attempted to call [place] at [phone]. The call continued to ring and went to a voice message. I requested them call me back to find out why the patient has not been contacted as of yet.

## 2019-12-27 NOTE — Telephone Encounter (Signed)
Called patient to discuss recent discharge from [facility]. Chelsea Barton states that she was unaware of her recent discharge and it was not properly explained to her. Chelsea Barton states that she needs assistance filling her pill box due to her poor vision as she can not see well enough to fill her pill box properly.

## 2019-12-27 NOTE — Telephone Encounter (Signed)
Pt called and said that a new nurse hasn't come out yet and she needs her pill box done for next mouth

## 2019-12-28 ENCOUNTER — Other Ambulatory Visit: Payer: Self-pay | Admitting: Family

## 2019-12-28 DIAGNOSIS — E1039 Type 1 diabetes mellitus with other diabetic ophthalmic complication: Secondary | ICD-10-CM

## 2019-12-28 DIAGNOSIS — I10 Essential (primary) hypertension: Secondary | ICD-10-CM

## 2019-12-28 NOTE — Telephone Encounter (Signed)
I received a call from [facility] no nurses avail. [place] form was placed on [place] desk to start the process for pt to have [place] services to assist and pt was notified.

## 2019-12-28 NOTE — Telephone Encounter (Signed)
Advise pt that new  Home  Health referral placed  Please call [facility] and let them know as well so they may continue /reinstate services  [place], do you need to submit referral back to [facility]?

## 2019-12-29 NOTE — Telephone Encounter (Signed)
Pt needs appt so we can fill out form for home health We have to documentation of limitations in recent office vist, notes

## 2019-12-30 NOTE — Telephone Encounter (Signed)
Called patient and scheduled a visit for 01/14/20 at 11am for a office visit.

## 2020-01-03 ENCOUNTER — Ambulatory Visit: Payer: Medicaid Other | Admitting: Pharmacist

## 2020-01-03 DIAGNOSIS — E1039 Type 1 diabetes mellitus with other diabetic ophthalmic complication: Secondary | ICD-10-CM

## 2020-01-03 DIAGNOSIS — I10 Essential (primary) hypertension: Secondary | ICD-10-CM

## 2020-01-03 NOTE — Chronic Care Management (AMB) (Signed)
Chronic Care Management   Follow Up Note   01/03/2020 Name: Chelsea Barton MRN: [id number] DOB: Jan 06, 1961  Referred by: Burnard Hawthorne, FNP Reason for referral : Chronic Care Management (Medication Management)   Chelsea Barton is a 59 y.o. year old female who is a primary care patient of Burnard Hawthorne, FNP. The CCM team was consulted for assistance with chronic disease management and care coordination needs.    Contacted patient for medication management review.   Review of patient status, including review of consultants reports, relevant laboratory and other test results, and collaboration with appropriate care team members and the patient's provider was performed as part of comprehensive patient evaluation and provision of chronic care management services.    SDOH (Social Determinants of Health) assessments performed: Yes See Care Plan activities for detailed interventions related to SDOH)  SDOH Interventions     Most Recent Value  SDOH Interventions  Food Insecurity Interventions Other (Comment)  [care guide referral]  Financial Strain Interventions Other (Comment)  [care guide referral]       Outpatient Encounter Medications as of 01/03/2020  Medication Sig  . aspirin 81 MG tablet Take 1 tablet (81 mg total) by mouth daily.  . carvedilol (COREG) 12.5 MG tablet Take 1 tablet (12.5 mg total) by mouth 2 (two) times daily with a meal. Take one tablet by mouth daily with a meal.  . Continuous Blood Gluc Sensor (DEXCOM G6 SENSOR) MISC SMARTSIG:1 Each Topical Every 10 Days  . Continuous Blood Gluc Transmit (DEXCOM G6 TRANSMITTER) MISC   . digoxin (LANOXIN) 0.125 MG tablet Take 1 tablet (125 mcg total) by mouth daily.  . enalapril (VASOTEC) 20 MG tablet TAKE 2 TABLETS(40 MG) BY MOUTH DAILY  . Insulin Glargine (LANTUS SOLOSTAR) 100 UNIT/ML Solostar Pen Inject 40 Units into the skin daily.   . Insulin Pen Needle (B-D ULTRAFINE III SHORT PEN) 31G X 8 MM MISC USE AS DIRECTED  .  Multiple Vitamin (MULTIVITAMIN) capsule Take 1 capsule by mouth daily.    Marland Kitchen NOVOLOG FLEXPEN 100 UNIT/ML FlexPen Inject 25 Units into the skin 3 (three) times daily with meals. Take 5 units at breakfast, 10 units at lunch and 10 units at dinner.  . simvastatin (ZOCOR) 20 MG tablet TAKE 1 TABLET(20 MG) BY MOUTH DAILY AT 6 PM  . vitamin D, CHOLECALCIFEROL, 400 UNITS tablet Take 400 Units by mouth 2 (two) times daily.     No facility-administered encounter medications on file as of 01/03/2020.     Objective:   Goals Addressed              This Visit's Progress     Patient Stated   .  PharmD "I want to stay healthy" (pt-stated)        CARE PLAN ENTRY (see longtitudinal plan of care for additional care plan information)  Current Barriers:  . Diabetes, type 1; complicated by chronic medical conditions including HTN, HFpEF (recovered), CAD, CKD, most recent A1c 7.0%- follows w/ Dr. Gabriel Carina at [facility]. Stable control of DM o Does report some affordability issues w/ food.  o Notes that her [facility] approval expired, and her [facility] RN was helping fill pill box. Her sister has been helping with filling her pill box recently, but she has an appt w/ PCP in ~2 weeks to re-order [facility] services. Unsure if she has ever had PCS services.  . Most recent eGFR: 37 mL/min (per Care Everywhere) . Current antihyperglycemic regimen: Lantus 40 units daily, Novolog  10/24/08 units with meals; using DexCom CGM . Denies episodes of hypoglycemia recently.  . Cardiovascular risk reduction: Follows w/ Dr. Nehemiah Massed, CHF (last EF 55% 2016, though previously <30%); CAD (cath in 2010);  o Current hypertensive regimen: carvedilol 12.5 mg BID, enalapril 40 mg daily- though last Dr. Nehemiah Massed script was sent for 20 mg daily, and last script from [name] for 2 20 mg tabs daily was only sent for 90 tabs, also digoxin 0.125 mg (no digoxin level on file); reports home BP readings 130-140s/60-80s, though patient reports she has not been paying attention to  her HF o Current hyperlipidemia regimen: simvastatin 20 mg daily, last LDL NOT at goal <70 (71 05/2019) o Current antiplatelet regimen: aspirin 81 mg daily   Pharmacist Clinical Goal(s):  Marland Kitchen Over the next 90 days, patient will work with PharmD and primary care provider to address optimized medication management and care coordination  Interventions: . Comprehensive medication review performed, medication list updated in electronic medical record . Inter-disciplinary care team collaboration (see longitudinal plan of care) . Praised for continued control of DM  . Discussed goal BP <130/80 given DM, CKD. Encouraged continued home BP checks, with documentation of BP and HR, to bring to upcoming appt w/ PCP. If continues to be generally elevated and there is room for HR lowering, consider increasing carvedilol. Will collaborate w/ PCP to send updated script for enalapril 40 mg daily to match current instructions. Patient has f/u with Dr. Nehemiah Massed next month. . Placed Care Guide referral for support in pursuing PCS aide services as well as food resources. Encouraged patient to discuss any other needs w/ Care Guide . Moving forward, if patient unable to get help for someone to fill pill box for her, will pursue transferring medications to [facility] for adherence packaging.     Patient Self Care Activities: . Patient will check blood glucose at least QID using CGM document, and provide at future appointments . Patient will take medications as prescribed . Patient will report any questions or concerns to provider   Please see past updates related to this goal by clicking on the "Past Updates" button in the selected goal           Plan:  - Scheduled f/u call in ~ 4 weeks  [name], PharmD, [place], [place] Pharmacist [place] [phone]

## 2020-01-03 NOTE — Patient Instructions (Signed)
Visit Information  Goals Addressed              This Visit's Progress     Patient Stated   .  PharmD "I want to stay healthy" (pt-stated)        CARE PLAN ENTRY (see longtitudinal plan of care for additional care plan information)  Current Barriers:  . Diabetes, type 1; complicated by chronic medical conditions including HTN, HFpEF (recovered), CAD, CKD, most recent A1c 7.0%- follows w/ Dr. Gabriel Carina at [facility]. Stable control of DM o Does report some affordability issues w/ food.  o Notes that her [facility] approval expired, and her [facility] RN was helping fill pill box. Her sister has been helping with filling her pill box recently, but she has an appt w/ PCP in ~2 weeks to re-order [facility] services. Unsure if she has ever had PCS services.  . Most recent eGFR: 37 mL/min (per Care Everywhere) . Current antihyperglycemic regimen: Lantus 40 units daily, Novolog 10/24/08 units with meals; using DexCom CGM . Denies episodes of hypoglycemia recently.  . Cardiovascular risk reduction: Follows w/ Dr. Nehemiah Massed, CHF (last EF 55% 2016, though previously <30%); CAD (cath in 2010);  o Current hypertensive regimen: carvedilol 12.5 mg BID, enalapril 40 mg daily- though last Dr. Nehemiah Massed script was sent for 20 mg daily, and last script from [name] for 2 20 mg tabs daily was only sent for 90 tabs, also digoxin 0.125 mg (no digoxin level on file); reports home BP readings 130-140s/60-80s, though patient reports she has not been paying attention to her HF o Current hyperlipidemia regimen: simvastatin 20 mg daily, last LDL NOT at goal <70 (71 05/2019) o Current antiplatelet regimen: aspirin 81 mg daily   Pharmacist Clinical Goal(s):  Marland Kitchen Over the next 90 days, patient will work with PharmD and primary care provider to address optimized medication management and care coordination  Interventions: . Comprehensive medication review performed, medication list updated in electronic medical record . Inter-disciplinary care team collaboration  (see longitudinal plan of care) . Praised for continued control of DM  . Discussed goal BP <130/80 given DM, CKD. Encouraged continued home BP checks, with documentation of BP and HR, to bring to upcoming appt w/ PCP. If continues to be generally elevated and there is room for HR lowering, consider increasing carvedilol. Will collaborate w/ PCP to send updated script for enalapril 40 mg daily to match current instructions. Patient has f/u with Dr. Nehemiah Massed next month. . Placed Care Guide referral for support in pursuing PCS aide services as well as food resources. Encouraged patient to discuss any other needs w/ Care Guide . Moving forward, if patient unable to get help for someone to fill pill box for her, will pursue transferring medications to [facility] for adherence packaging.     Patient Self Care Activities: . Patient will check blood glucose at least QID using CGM document, and provide at future appointments . Patient will take medications as prescribed . Patient will report any questions or concerns to provider   Please see past updates related to this goal by clicking on the "Past Updates" button in the selected goal          The patient verbalized understanding of instructions provided today and declined a print copy of patient instruction materials.    Plan:  - Scheduled f/u call in ~ 4 weeks  [name], PharmD, [place], [place] Pharmacist [place] [phone]

## 2020-01-06 ENCOUNTER — Other Ambulatory Visit: Payer: Self-pay | Admitting: Family

## 2020-01-06 DIAGNOSIS — I10 Essential (primary) hypertension: Secondary | ICD-10-CM

## 2020-01-06 NOTE — Progress Notes (Signed)
Agree with plan. Laurrie Toppin, NP  

## 2020-01-12 ENCOUNTER — Ambulatory Visit: Payer: Medicaid Other | Admitting: Family

## 2020-01-14 ENCOUNTER — Other Ambulatory Visit: Payer: Self-pay

## 2020-01-14 ENCOUNTER — Encounter: Payer: Self-pay | Admitting: Family

## 2020-01-14 ENCOUNTER — Ambulatory Visit: Payer: Medicaid Other | Admitting: Family

## 2020-01-14 DIAGNOSIS — H547 Unspecified visual loss: Secondary | ICD-10-CM

## 2020-01-14 DIAGNOSIS — I10 Essential (primary) hypertension: Secondary | ICD-10-CM | POA: Diagnosis not present

## 2020-01-14 DIAGNOSIS — E1039 Type 1 diabetes mellitus with other diabetic ophthalmic complication: Secondary | ICD-10-CM

## 2020-01-14 MED ORDER — ENALAPRIL MALEATE 20 MG PO TABS: ORAL_TABLET | ORAL | 2 refills | Status: DC

## 2020-01-14 NOTE — Assessment & Plan Note (Signed)
Follows with endocrine , will follow

## 2020-01-14 NOTE — Assessment & Plan Note (Addendum)
Elevated today.  however I am concerned that patient is confused and with blood pressure she is taking today and not taking.  Unfortunately Home Health stopped coming to her house and has no longer filling her pillbox pillboxes.  Advised patient to have sister come to her house today and ensure her pillboxes are accurate and also to check her blood pressure.  Advise any chest pain, shortness of breath or her to call 911.  Very hesitant to change her blood pressure regimen today when Im not really sure what medications she has taken.  patient was understanding that she will comply.  Follow-up next week as

## 2020-01-14 NOTE — Assessment & Plan Note (Signed)
Chronic, stable vision impairment. Has evidenced today by  patient not being sure which medication she is taken, patient safety is in jeopardy without having home health nurses to help her fill her pillboxes.  In the interim, patient has a sister who is very helpful and whom she states will come by her house every 2 weeks to complete her pillboxes.  I have also asked patient to come back in next week and bring all her medications, pillbox with her so we can perform a medication reconciliation, recheck blood pressure.  Given paperwork to referral coordinator today to reinitiate home health via Medicaid.

## 2020-01-14 NOTE — Progress Notes (Signed)
Subjective:    Patient ID: Chelsea Barton, female    DOB: [birth year], 59 y.o.   MRN: [id number]  CC: Chelsea Barton is a 59 y.o. female who presents today for follow up.   HPI:  Would like home health to come back, had a good relationship with Chelsea Barton.   Feels well today. No new complaints.    Vision has been blurry bilateral since lense operation in right eye 10 years ago. Reports chronic, unchanged. H/o BL cataracts Follows with eye doctor annually, last October 2020. Wears reading glasses. No sudden vision loss, HA.   Doesn't drive due to vision.   States her sister can fill up pill box as it is filled once every 2 weeks.    Would like home health to make her pill box as she doesn't feel safe in doing this.    HTN- confused by blood pressure medications and not sure if taking two tablets of enalapril.  She states she is compliant with her digoxin.  She is not exactly sure if she is taking the Coreg .  no CP, SOB, left arm numbness.   DM- follows with Chelsea Barton, last appointment 12/16/19. Wears CGM , G6.    HISTORY:  Past Medical History:  Diagnosis Date  . Diabetes mellitus   . Hyperlipidemia   . Hypertension    Past Surgical History:  Procedure Laterality Date  . CATARACT EXTRACTION    . eye surgery r Right    lense placed   Family History  Problem Relation Age of Onset  . Diabetes Sister        3/5 sisters have DM  . Breast cancer Neg Hx   . Colon cancer Neg Hx     Allergies: Patient has no known allergies. Current Outpatient Medications on File Prior to Visit  Medication Sig Dispense Refill  . aspirin 81 MG tablet Take 1 tablet (81 mg total) by mouth daily. 30 tablet 6  . carvedilol (COREG) 12.5 MG tablet Take 1 tablet (12.5 mg total) by mouth 2 (two) times daily with a meal. Take one tablet by mouth daily with a meal. 180 tablet 1  . Continuous Blood Gluc Sensor (DEXCOM G6 SENSOR) MISC SMARTSIG:1 Each Topical Every 10 Days    . Continuous Blood Gluc  Transmit (DEXCOM G6 TRANSMITTER) MISC     . digoxin (LANOXIN) 0.125 MG tablet Take 1 tablet (125 mcg total) by mouth daily. 90 tablet 1  . Insulin Glargine (LANTUS SOLOSTAR) 100 UNIT/ML Solostar Pen Inject 40 Units into the skin daily.     . Insulin Pen Needle (B-D ULTRAFINE III SHORT PEN) 31G X 8 MM MISC USE AS DIRECTED 100 each 6  . Multiple Vitamin (MULTIVITAMIN) capsule Take 1 capsule by mouth daily.      Marland Kitchen NOVOLOG FLEXPEN 100 UNIT/ML FlexPen Inject 25 Units into the skin 3 (three) times daily with meals. Take 5 units at breakfast, 10 units at lunch and 10 units at dinner.    . simvastatin (ZOCOR) 20 MG tablet TAKE 1 TABLET(20 MG) BY MOUTH DAILY AT 6 PM 90 tablet 1  . vitamin D, CHOLECALCIFEROL, 400 UNITS tablet Take 400 Units by mouth 2 (two) times daily.       No current facility-administered medications on file prior to visit.    Social History   Tobacco Use  . Smoking status: Former Smoker    Quit date: 06/18/2005    Years since quitting: 14.5  . Smokeless tobacco: Never Used  Substance Use Topics  . Alcohol use: No  . Drug use: No    Review of Systems  Constitutional: Negative for chills and fever.  Respiratory: Negative for cough.   Cardiovascular: Negative for chest pain and palpitations.  Gastrointestinal: Negative for nausea and vomiting.      Objective:    BP (!) 150/82 (BP Location: Left Arm, Patient Position: Sitting)   Pulse 66   Temp 98.8 F (37.1 C)   Ht 5\' 5"  (1.651 m)   Wt (!) 205 lb 12.8 oz (93.4 kg)   LMP  (Exact Date)   SpO2 95%   BMI 34.25 kg/m  BP Readings from Last 3 Encounters:  01/14/20 (!) 150/82  10/13/19 140/76  06/25/19 140/70   Wt Readings from Last 3 Encounters:  01/14/20 (!) 205 lb 12.8 oz (93.4 kg)  10/13/19 207 lb (93.9 kg)  06/25/19 209 lb (94.8 kg)    Physical Exam Vitals reviewed.  Constitutional:      Appearance: She is well-developed.  Eyes:     Conjunctiva/sclera: Conjunctivae normal.  Cardiovascular:     Rate and  Rhythm: Normal rate and regular rhythm.     Pulses: Normal pulses.     Heart sounds: Normal heart sounds.  Pulmonary:     Effort: Pulmonary effort is normal.     Breath sounds: Normal breath sounds. No wheezing, rhonchi or rales.  Skin:    General: Skin is warm and dry.  Neurological:     Mental Status: She is alert.  Psychiatric:        Speech: Speech normal.        Behavior: Behavior normal.        Thought Content: Thought content normal.        Assessment & Plan:   Problem List Items Addressed This Visit      Cardiovascular and Mediastinum   Hypertension    Elevated today.  however I am concerned that patient is confused and with blood pressure she is taking today and not taking.  Unfortunately Home Health stopped coming to her house and has no longer filling her pillbox pillboxes.  Advised patient to have sister come to her house today and ensure her pillboxes are accurate and also to check her blood pressure.  Advise any chest pain, shortness of breath or her to call 911.  Very hesitant to change her blood pressure regimen today when Im not really sure what medications she has taken.  patient was understanding that she will comply.  Follow-up next week as      Relevant Medications   enalapril (VASOTEC) 20 MG tablet     Endocrine   DM type 1 (diabetes mellitus, type 1) ([name])    Follows with endocrine , will follow      Relevant Medications   enalapril (VASOTEC) 20 MG tablet     Other   Impaired vision    Chronic, stable vision impairment. Has evidenced today by  patient not being sure which medication she is taken, patient safety is in jeopardy without having home health nurses to help her fill her pillboxes.  In the interim, patient has a sister who is very helpful and whom she states will come by her house every 2 weeks to complete her pillboxes.  I have also asked patient to come back in next week and bring all her medications, pillbox with her so we can perform a  medication reconciliation, recheck blood pressure.  Given paperwork to referral coordinator today to  reinitiate home health via Medicaid.          I am having Chelsea Barton maintain her multivitamin, vitamin D (CHOLECALCIFEROL), aspirin, NovoLOG FlexPen, insulin glargine, B-D ULTRAFINE III SHORT PEN, digoxin, carvedilol, simvastatin, Dexcom G6 Transmitter, Dexcom G6 Sensor, and enalapril.   Meds ordered this encounter  Medications  . enalapril (VASOTEC) 20 MG tablet    Sig: TAKE 2 TABLETS(40 MG) BY MOUTH DAILY    Dispense:  120 tablet    Refill:  2    Order Specific Question:   Supervising Provider    Answer:   Crecencio Mc [[id number]]    Return precautions given.   Risks, benefits, and alternatives of the medications and treatment plan prescribed today were discussed, and patient expressed understanding.   Education regarding symptom management and diagnosis given to patient on AVS.  Continue to follow with Burnard Hawthorne, FNP for routine health maintenance.   Hazle D Vanburen and I agreed with plan.   Mable Paris, FNP

## 2020-01-14 NOTE — Patient Instructions (Addendum)
Please ensure that your sister is able to fill your pillboxes.  Blood pressure is elevated today however I am concerned that it might be related to you not take medications correctly. I would also like for you to check her blood pressure at home.  Please bring your pillbox and all your medications with you so we can do medication reconciliation next week.

## 2020-01-17 NOTE — Progress Notes (Signed)
Called patient. Chelsea Barton states that she has felt pretty good and that her sister has come to assist her with the pill boxes. She has not checked her blood pressure and states that she forgot to check it yesterday as well.

## 2020-01-19 ENCOUNTER — Ambulatory Visit: Payer: Medicaid Other | Admitting: Family

## 2020-01-20 ENCOUNTER — Other Ambulatory Visit: Payer: Self-pay

## 2020-01-20 ENCOUNTER — Ambulatory Visit: Payer: Medicaid Other | Admitting: Podiatry

## 2020-01-20 ENCOUNTER — Encounter: Payer: Self-pay | Admitting: Podiatry

## 2020-01-20 DIAGNOSIS — M79674 Pain in right toe(s): Secondary | ICD-10-CM

## 2020-01-20 DIAGNOSIS — M79675 Pain in left toe(s): Secondary | ICD-10-CM

## 2020-01-20 DIAGNOSIS — N189 Chronic kidney disease, unspecified: Secondary | ICD-10-CM

## 2020-01-20 DIAGNOSIS — E1042 Type 1 diabetes mellitus with diabetic polyneuropathy: Secondary | ICD-10-CM

## 2020-01-20 DIAGNOSIS — B351 Tinea unguium: Secondary | ICD-10-CM

## 2020-01-20 NOTE — Progress Notes (Signed)
Complaint:  Visit Type: Patient returns to my office for continued preventative foot care services. Complaint: Patient states" my nails have grown long and thick and become painful to walk and wear shoes" Patient has been diagnosed with DM with no foot complications. The patient presents for preventative foot care services. No changes to ROS  Podiatric Exam: Vascular: dorsalis pedis and posterior tibial pulses are barely  palpable bilateral. Capillary return is immediate. Temperature gradient is WNL. Skin turgor WNL  Sensorium: Diminished  Semmes Weinstein monofilament test. Normal tactile sensation bilaterally. Nail Exam: Pt has thick disfigured discolored nails with subungual debris noted bilateral entire nail hallux through fifth toenails Ulcer Exam: There is no evidence of ulcer or pre-ulcerative changes or infection. Orthopedic Exam: Muscle tone and strength are WNL. No limitations in general ROM. No crepitus or effusions noted. HAV  B/L.  Skin: No Porokeratosis. No infection or ulcers.  Callus along the lateral heel right foot.  Asymptomatic.  Diagnosis:  Onychomycosis, , Pain in right toe, pain in left toes  Treatment & Plan Procedures and Treatment: Consent by patient was obtained for treatment procedures. The patient understood the discussion of treatment and procedures well. All questions were answered thoroughly reviewed. Debridement of mycotic and hypertrophic toenails, 1 through 5 bilateral and clearing of subungual debris. No ulceration, no infection noted.  Return Visit-Office Procedure: Patient instructed to return to the office for a follow up visit 6 months for continued evaluation and treatment.    Gardiner Barefoot DPM

## 2020-01-24 ENCOUNTER — Telehealth: Payer: Self-pay | Admitting: Family

## 2020-01-24 NOTE — Telephone Encounter (Signed)
lft vm for pt to call ofc regarding documents that was sent to [place].

## 2020-01-26 ENCOUNTER — Telehealth: Payer: Self-pay

## 2020-01-26 NOTE — Telephone Encounter (Signed)
Called Chelsea Barton. She states that she has forgotten to take her Blood Pressure and has not taken it this week due to a family emergency. She will begin taking them again tomorrow and attempt to submit her log to [name] by MyChart.

## 2020-01-26 NOTE — Telephone Encounter (Signed)
-----   Message from Burnard Hawthorne, [place] sent at 01/17/2020  1:32 PM EDT ----- Call pt I need BP log ----- Message ----- From: Ezequiel Ganser, CMA Sent: 01/17/2020  10:42 AM EDT To: Burnard Hawthorne, FNP    ----- Message ----- From: Burnard Hawthorne, FNP Sent: 01/14/2020   1:47 PM EDT To: Ezequiel Ganser, CMA  Call pt How is feeling? Has her sister come to assist her with pill boxes? Has she checked her blood pressure?

## 2020-01-26 NOTE — Progress Notes (Signed)
Called Chelsea Barton. She states that she has forgotten to take her Blood Pressure and has not taken it this week due to a family emergency. She will begin taking them again tomorrow and attempt to submit her log to [name] by MyChart.

## 2020-02-03 ENCOUNTER — Other Ambulatory Visit: Payer: Self-pay | Admitting: Family

## 2020-02-07 ENCOUNTER — Ambulatory Visit: Payer: Medicaid Other | Admitting: Pharmacist

## 2020-02-07 ENCOUNTER — Other Ambulatory Visit: Payer: Self-pay | Admitting: Pharmacist

## 2020-02-07 DIAGNOSIS — E1039 Type 1 diabetes mellitus with other diabetic ophthalmic complication: Secondary | ICD-10-CM

## 2020-02-07 NOTE — Patient Instructions (Signed)
Visit Information  Goals Addressed              This Visit's Progress     Patient Stated   .  PharmD "I want to stay healthy" (pt-stated)        CARE PLAN ENTRY (see longtitudinal plan of care for additional care plan information)  Current Barriers:  . Social, community, and financial barriers: o Calls today with concerns about current CGM manufacturer. Notes that she will be on hold for hours, and has not been receiving sensors in time, so has been fluctuating between checking with finger sticks and CGM. Very frustrated and wonders if there are other  . Diabetes, type 1; complicated by chronic medical conditions including HTN, HFpEF (recovered), CAD, CKD, most recent A1c 7.0%- follows w/ Dr. Gabriel Carina at [facility]. Stable control of DM . Most recent eGFR: 37 mL/min (per Care Everywhere) . Current antihyperglycemic regimen: Lantus 40 units daily, Novolog 10/24/08 units with meals; using DexCom CGM . Denies episodes of hypoglycemia recently.  . Cardiovascular risk reduction: Follows w/ Dr. Nehemiah Massed, CHF (last EF 55% 2016, though previously <30%); CAD (cath in 2010);  o Current hypertensive regimen: carvedilol 12.5 mg BID, enalapril 40 mg daily, digoxin 0.125 mg (no digoxin level on file);  o Current hyperlipidemia regimen: simvastatin 20 mg daily, last LDL NOT at goal <70 (71 05/2019) o Current antiplatelet regimen: aspirin 81 mg daily   Pharmacist Clinical Goal(s):  Marland Kitchen Over the next 90 days, patient will work with PharmD and primary care provider to address optimized medication management and care coordination  Interventions: . Comprehensive medication review performed, medication list updated in electronic medical record . Inter-disciplinary care team collaboration (see longitudinal plan of care) . Contacted [employer]. They note that DexCom CGM is covered under patient's precsription benefit at [facility]. Contacted [facility], left message for Dr. Gabriel Carina requesting that they send this  script to [facility]. Notified patient, she requested that I ask [name] to send the script, in case it takes Dr. Joycie Peek office a few days to respond to my message. Patient is almost out of sensors. Routed request to PCP  Patient Self Care Activities: . Patient will check blood glucose at least QID using CGM document, and provide at future appointments . Patient will take medications as prescribed . Patient will report any questions or concerns to provider   Please see past updates related to this goal by clicking on the "Past Updates" button in the selected goal          The patient verbalized understanding of instructions provided today and declined a print copy of patient instruction materials.   Plan:  - Scheduled f/u call in ~4 weeks  [name], PharmD, [place], [place] Pharmacist [place] [phone]

## 2020-02-07 NOTE — Telephone Encounter (Signed)
Confirmed w/ insurance that DexCom sensors and transmitter covered at [employer]. Contacted endocrinology to let them know, but patient requested that we go ahead and send the script as she is about to run out. Order pended in this encounter

## 2020-02-07 NOTE — Chronic Care Management (AMB) (Signed)
Chronic Care Management   Follow Up Note   02/07/2020 Name: Chelsea Barton MRN: [id number] DOB: [birth year]  Referred by: Burnard Hawthorne, FNP Reason for referral : Chronic Care Management (Medication Management)   Chelsea Barton is a 59 y.o. year old female who is a primary care patient of Burnard Hawthorne, FNP. The CCM team was consulted for assistance with chronic disease management and care coordination needs.    Contacted patient for medication management review.  Review of patient status, including review of consultants reports, relevant laboratory and other test results, and collaboration with appropriate care team members and the patient's provider was performed as part of comprehensive patient evaluation and provision of chronic care management services.    SDOH (Social Determinants of Health) assessments performed: No See Care Plan activities for detailed interventions related to [facility])     Outpatient Encounter Medications as of 02/07/2020  Medication Sig   aspirin 81 MG tablet Take 1 tablet (81 mg total) by mouth daily.   carvedilol (COREG) 12.5 MG tablet Take 1 tablet (12.5 mg total) by mouth 2 (two) times daily with a meal. Take one tablet by mouth daily with a meal.   Continuous Blood Gluc Sensor (DEXCOM G6 SENSOR) MISC SMARTSIG:1 Each Topical Every 10 Days   Continuous Blood Gluc Transmit (DEXCOM G6 TRANSMITTER) MISC    digoxin (LANOXIN) 0.125 MG tablet Take 1 tablet (125 mcg total) by mouth daily.   enalapril (VASOTEC) 20 MG tablet TAKE 2 TABLETS(40 MG) BY MOUTH DAILY   hydrALAZINE (APRESOLINE) 10 MG tablet TAKE 1/2 TABLET BY MOUTH THREE TIMES A DAY AS NEEDED(TAKE IF BLOOD PRESSURE GREATER THAN 140/90)   Insulin Glargine (LANTUS SOLOSTAR) 100 UNIT/ML Solostar Pen Inject 40 Units into the skin daily.    Insulin Pen Needle (B-D ULTRAFINE III SHORT PEN) 31G X 8 MM MISC USE AS DIRECTED   Multiple Vitamin (MULTIVITAMIN) capsule Take 1 capsule by mouth daily.       NOVOLOG FLEXPEN 100 UNIT/ML FlexPen Inject 25 Units into the skin 3 (three) times daily with meals. Take 5 units at breakfast, 10 units at lunch and 10 units at dinner.   simvastatin (ZOCOR) 20 MG tablet TAKE 1 TABLET(20 MG) BY MOUTH DAILY AT 6 PM   vitamin D, CHOLECALCIFEROL, 400 UNITS tablet Take 400 Units by mouth 2 (two) times daily.     No facility-administered encounter medications on file as of 02/07/2020.     Objective:   Goals Addressed              This Visit's Progress     Patient Stated     PharmD "I want to stay healthy" (pt-stated)        CARE PLAN ENTRY (see longtitudinal plan of care for additional care plan information)  Current Barriers:   Social, community, and financial barriers: o Calls today with concerns about current CGM manufacturer. Notes that she will be on hold for hours, and has not been receiving sensors in time, so has been fluctuating between checking with finger sticks and CGM. Very frustrated and wonders if there are other   Diabetes, type 1; complicated by chronic medical conditions including HTN, HFpEF (recovered), CAD, CKD, most recent A1c 7.0%- follows w/ Dr. Gabriel Carina at [facility]. Stable control of DM  Most recent eGFR: 37 mL/min (per Care Everywhere)  Current antihyperglycemic regimen: Lantus 40 units daily, Novolog 10/24/08 units with meals; using DexCom CGM  Denies episodes of hypoglycemia recently.   Cardiovascular risk reduction:  Follows w/ Dr. Nehemiah Massed, CHF (last EF 55% 2016, though previously <30%); CAD (cath in 2010);  o Current hypertensive regimen: carvedilol 12.5 mg BID, enalapril 40 mg daily, digoxin 0.125 mg (no digoxin level on file);  o Current hyperlipidemia regimen: simvastatin 20 mg daily, last LDL NOT at goal <70 (71 05/2019) o Current antiplatelet regimen: aspirin 81 mg daily   Pharmacist Clinical Goal(s):   Over the next 90 days, patient will work with PharmD and primary care provider to address optimized medication  management and care coordination  Interventions:  Comprehensive medication review performed, medication list updated in electronic medical record  Inter-disciplinary care team collaboration (see longitudinal plan of care)  Contacted [employer] Healthy [place] plan. They note that DexCom CGM is covered under patient's precsription benefit at [facility]. Contacted [facility], left message for Dr. Gabriel Carina requesting that they send this script to [facility]. Notified patient, she requested that I ask [name] to send the script, in case it takes Dr. Joycie Peek office a few days to respond to my message. Patient is almost out of sensors. Routed request to PCP  Patient Self Care Activities:  Patient will check blood glucose at least QID using CGM document, and provide at future appointments  Patient will take medications as prescribed  Patient will report any questions or concerns to provider   Please see past updates related to this goal by clicking on the "Past Updates" button in the selected goal           Plan:  - Scheduled f/u call in ~4 weeks  [name], PharmD, [place], [place] Pharmacist [place] [phone]

## 2020-02-08 ENCOUNTER — Telehealth: Payer: Medicaid Other

## 2020-02-08 MED ORDER — DEXCOM G6 TRANSMITTER MISC: 3 refills | Status: DC

## 2020-02-08 MED ORDER — DEXCOM G6 SENSOR MISC: 3 refills | Status: DC

## 2020-02-08 NOTE — Progress Notes (Signed)
Agree with plan. Detron Carras, NP  

## 2020-02-09 ENCOUNTER — Other Ambulatory Visit: Payer: Self-pay | Admitting: Pharmacist

## 2020-02-09 DIAGNOSIS — I10 Essential (primary) hypertension: Secondary | ICD-10-CM

## 2020-02-09 NOTE — Telephone Encounter (Signed)
Patient called, noted that the pharmacy only filled enalapril for 90 tablets. Script was sent for 120 tablets, but since patient takes 2 daily, her current supply is only a 45 day supply.   Called today to request a 90 day supply script be called.   Routing to PCP to send. Pending order.

## 2020-02-11 ENCOUNTER — Other Ambulatory Visit: Payer: Self-pay

## 2020-02-11 ENCOUNTER — Telehealth: Payer: Self-pay

## 2020-02-11 DIAGNOSIS — I10 Essential (primary) hypertension: Secondary | ICD-10-CM

## 2020-02-11 MED ORDER — ENALAPRIL MALEATE 20 MG PO TABS: 40.0000 mg | ORAL_TABLET | Freq: Every day | ORAL | 3 refills | Status: DC

## 2020-02-11 NOTE — Telephone Encounter (Signed)
    MA8/27/2021 1st Attempt  Name: Chelsea Barton   MRN: [id number]   DOB: 1960-11-20   AGE: 59 y.o.   GENDER: female   PCP Burnard Hawthorne, FNP.   02/11/20 Spoke with patient about Meals on Wheels and status of food stamp application. Meals on Wheels will start on Wed. Sept. 3rd per email confirmation from Lincoln Maxin, Client Intake Coordinator. Called [place] DSS they do not have a food stamp application on file but will mail an application to be filled out and returned in the mail. Gave patient the SNAP supervisor's contact/Ms. Loyal Buba [phone] if patient has any questions about the form or needs assistance filing. Will follow-up in a few days to ensure patient has received SNAP application.     Andres Bantz, AAS Paralegal, [place] . Embedded Care Coordination [facility]  Care Management  [address], [place] [address] [email or web address]  [phone]  [email or web address]

## 2020-02-11 NOTE — Telephone Encounter (Signed)
I called patient to let her know #[phone] was sent for 3 month supply.

## 2020-02-17 NOTE — Telephone Encounter (Signed)
    MA9/07/2019   Name: Chelsea Barton   MRN: [id number]   DOB: 28-Oct-1960   AGE: 59 y.o.   GENDER: female   PCP Burnard Hawthorne, FNP.   02/17/20 Spoke with patient she has not received the [facility] application yet. I will call patient next week to follow-up. Patient is receiving meals on wheels delivery.     Elliot Simoneaux, AAS Paralegal, [place] . Embedded Care Coordination [facility]  Care Management  [address], [place] [address] [email or web address]  [phone]   [email or web address]

## 2020-02-19 ENCOUNTER — Telehealth: Payer: Self-pay | Admitting: *Deleted

## 2020-02-19 NOTE — Telephone Encounter (Signed)
Pt notified that lung cancer screening imaging is due currently or in the near future. Patient is a former smoker. Appointment made for 03/23/2020 at 9:30am.

## 2020-02-22 DIAGNOSIS — N1832 Chronic kidney disease, stage 3b: Secondary | ICD-10-CM | POA: Diagnosis not present

## 2020-02-22 DIAGNOSIS — E1122 Type 2 diabetes mellitus with diabetic chronic kidney disease: Secondary | ICD-10-CM | POA: Diagnosis not present

## 2020-02-22 DIAGNOSIS — R809 Proteinuria, unspecified: Secondary | ICD-10-CM | POA: Diagnosis not present

## 2020-02-22 DIAGNOSIS — N2581 Secondary hyperparathyroidism of renal origin: Secondary | ICD-10-CM | POA: Diagnosis not present

## 2020-02-22 DIAGNOSIS — I129 Hypertensive chronic kidney disease with stage 1 through stage 4 chronic kidney disease, or unspecified chronic kidney disease: Secondary | ICD-10-CM | POA: Diagnosis not present

## 2020-02-24 ENCOUNTER — Telehealth: Payer: Self-pay | Admitting: Family

## 2020-02-24 DIAGNOSIS — Z139 Encounter for screening, unspecified: Secondary | ICD-10-CM

## 2020-02-24 NOTE — Telephone Encounter (Signed)
lft msg on vm to follow up on [facility] referral.

## 2020-02-24 NOTE — Telephone Encounter (Signed)
    MA9/02/2020   Name: Chelsea Barton   MRN: [id number]   DOB: 1960/06/24   AGE: 59 y.o.   GENDER: female   PCP Burnard Hawthorne, FNP.   02/24/20 Spoke with patient she has received her SNAP application and is completing it and will mail it this week.  Patient stated she did not need assistance completing application.  No other resources are needed at this time. Closing referral.   Leni Pankonin, AAS Paralegal, [place] . Embedded Care Coordination [facility]  Care Management  [address], [place] [address] [name]  [phone]   [email or web address]

## 2020-02-24 NOTE — Telephone Encounter (Signed)
Manuela Schwartz from [facility], [phone]. She was wondering if Arnett knew how the patient could have her medication packed into individual packets.

## 2020-02-29 ENCOUNTER — Other Ambulatory Visit: Payer: Self-pay

## 2020-02-29 ENCOUNTER — Other Ambulatory Visit: Payer: Self-pay | Admitting: *Deleted

## 2020-02-29 DIAGNOSIS — I059 Rheumatic mitral valve disease, unspecified: Secondary | ICD-10-CM

## 2020-02-29 DIAGNOSIS — I2510 Atherosclerotic heart disease of native coronary artery without angina pectoris: Secondary | ICD-10-CM

## 2020-02-29 DIAGNOSIS — E785 Hyperlipidemia, unspecified: Secondary | ICD-10-CM

## 2020-02-29 DIAGNOSIS — Z87891 Personal history of nicotine dependence: Secondary | ICD-10-CM

## 2020-02-29 DIAGNOSIS — I10 Essential (primary) hypertension: Secondary | ICD-10-CM

## 2020-02-29 DIAGNOSIS — Z122 Encounter for screening for malignant neoplasm of respiratory organs: Secondary | ICD-10-CM

## 2020-02-29 MED ORDER — SIMVASTATIN 20 MG PO TABS: ORAL_TABLET | ORAL | 1 refills | Status: DC

## 2020-02-29 MED ORDER — HYDRALAZINE HCL 10 MG PO TABS: ORAL_TABLET | ORAL | 1 refills | Status: DC

## 2020-02-29 MED ORDER — CARVEDILOL 12.5 MG PO TABS: 12.5000 mg | ORAL_TABLET | Freq: Two times a day (BID) | ORAL | 1 refills | Status: DC

## 2020-02-29 MED ORDER — DIGOXIN 125 MCG PO TABS: 125.0000 ug | ORAL_TABLET | Freq: Every day | ORAL | 1 refills | Status: DC

## 2020-02-29 MED ORDER — ASPIRIN 81 MG PO TBEC: 81.0000 mg | DELAYED_RELEASE_TABLET | Freq: Every day | ORAL | 12 refills | Status: AC

## 2020-02-29 MED ORDER — ENALAPRIL MALEATE 20 MG PO TABS: 40.0000 mg | ORAL_TABLET | Freq: Every day | ORAL | 3 refills | Status: DC

## 2020-02-29 NOTE — Progress Notes (Signed)
Former smoker, quit 06/18/05, 36 pack year

## 2020-02-29 NOTE — Telephone Encounter (Signed)
I have spoken with patient who was okay with my sending prescriptions to [facility] for bubble packs. I called [facility] & spoke with Hailey. She just asked that I send prescriptions & fax them med list for patient. She stated that she would also reach out to patient to set her up asap. I called patient to let her know there was a $10 fee & that Wynelle Link would be reaching out to her to get patient set up. Pt stated that she was okay with fee. I have faxed med list & sent all prescriptions for bubble packs.

## 2020-02-29 NOTE — Telephone Encounter (Signed)
I called & spoke with patient's sister who was giving patient message to call back.

## 2020-03-02 NOTE — Telephone Encounter (Signed)
I called [place] to follow up on pt referral that was faxed in. I spoke to a rep she stated that [facility] is not participating with managed care and that I need to call [employer] [facility] [phone].   I called [employer] [facility] and a letter requesting personal care services will need to be faxed in to [phone]. I wanted to follow back up with pt to confirm that pt still needs the service.  I spoke with Ms Daughtrey to follow up if she still needed personal care services she stated that her pills come pack for easy daily usage and she is renting a room from her sister.   Pt did state that she does need help with filling out her food stamp assistance form. Referral form would be [id number].    Please advise and Thank you!

## 2020-03-03 NOTE — Telephone Encounter (Signed)
Thanks for all your help here [id number] has been placed

## 2020-03-03 NOTE — Addendum Note (Signed)
Addended by: Burnard Hawthorne on: 03/03/2020 01:29 PM   Modules accepted: Orders

## 2020-03-03 NOTE — Telephone Encounter (Signed)
Thank you and Your welcome. Referral sent.

## 2020-03-08 ENCOUNTER — Telehealth: Payer: Self-pay | Admitting: Family

## 2020-03-08 NOTE — Telephone Encounter (Signed)
° °  [facility] 03/08/2020 1st Attempt    Name: Chelsea Barton    MRN: [id number]    DOB: Apr 07, 1961    AGE: 59 y.o.    GENDER: female    PCP Burnard Hawthorne, FNP.    Called patient today regarding Community Resource Referral for assistance with FNS (food stamp)  Application. Left message for patient to give Care Guide a call back. Per notes from Ambrose Mantle, Care Guide on 02/11/20 patient had received FNS application and stated that she does not need assistance with completing the form. Will try to give patient a call at a later time to see if she is still in need of assistance.   Follow up on: 03/09/2020  [place], Care Management Phone: [phone] Email: [email or web address]

## 2020-03-09 ENCOUNTER — Telehealth: Payer: Self-pay | Admitting: Family

## 2020-03-09 NOTE — Telephone Encounter (Signed)
I called patient to let her know it was not [name] that called her. I provided Ms. Foskey's info below for patient to reach out to her.

## 2020-03-09 NOTE — Telephone Encounter (Signed)
Patient was returning call for referral assistance

## 2020-03-09 NOTE — Telephone Encounter (Signed)
I called [facility] & spoke with Chelsea Barton she was going to cancel out patient's order for bubble packs. She was also going to transfer all her prescriptions back to [facility] on [address]. I called & made patient aware if this.

## 2020-03-09 NOTE — Telephone Encounter (Signed)
Pt states that she does not like the pharmacy giving her "pill packs" and she would like to go back to getting her pills individually-she states that her sister will help her? She also wants to have all of her meds refilled at [facility] on [address].

## 2020-03-13 NOTE — Telephone Encounter (Signed)
° °  [facility] 03/13/2020    Name: Chelsea Barton    MRN: [id number]    DOB: [birth year]    AGE: 59 y.o.    GENDER: female    PCP Burnard Hawthorne, FNP.   Spoke with patient today regarding assistance with FNS application. Patient stated that she still needs assistance with completing the application. Assisted patient with completing application. Informed patient to turn the application in at [place] and once application is processed a caseworker will give her a call. Patient stated understanding.   Closing referral pending any other needs of patients.  [place], Care Management Phone: [phone] Email: [email or web address]

## 2020-03-14 ENCOUNTER — Telehealth: Payer: Medicaid Other

## 2020-03-18 ENCOUNTER — Other Ambulatory Visit: Payer: Self-pay | Admitting: Family

## 2020-03-23 ENCOUNTER — Ambulatory Visit: Payer: Medicaid Other

## 2020-03-30 ENCOUNTER — Ambulatory Visit: Payer: Medicaid Other | Admitting: Pharmacist

## 2020-03-30 DIAGNOSIS — E785 Hyperlipidemia, unspecified: Secondary | ICD-10-CM

## 2020-03-30 DIAGNOSIS — E1039 Type 1 diabetes mellitus with other diabetic ophthalmic complication: Secondary | ICD-10-CM

## 2020-03-30 DIAGNOSIS — I10 Essential (primary) hypertension: Secondary | ICD-10-CM

## 2020-03-30 DIAGNOSIS — N189 Chronic kidney disease, unspecified: Secondary | ICD-10-CM

## 2020-03-30 NOTE — Chronic Care Management (AMB) (Signed)
Chronic Care Management   Follow Up Note   03/30/2020 Name: Chelsea Barton MRN: [id number] DOB: [birth year]  Referred by: Burnard Hawthorne, FNP Reason for referral : Chronic Care Management (Medication Management)   Chelsea Barton is a 59 y.o. year old female who is a primary care patient of Burnard Hawthorne, FNP. The CCM team was consulted for assistance with chronic disease management and care coordination needs.    Contacted patient for medication management review.   Review of patient status, including review of consultants reports, relevant laboratory and other test results, and collaboration with appropriate care team members and the patient's provider was performed as part of comprehensive patient evaluation and provision of chronic care management services.    SDOH (Social Determinants of Health) assessments performed: Yes See Care Plan activities for detailed interventions related to SDOH)  SDOH Interventions     Most Recent Value  SDOH Interventions  Financial Strain Interventions Intervention Not Indicated       Outpatient Encounter Medications as of 03/30/2020  Medication Sig  . aspirin 81 MG EC tablet Take 1 tablet (81 mg total) by mouth daily. Swallow whole.  . carvedilol (COREG) 12.5 MG tablet Take 1 tablet (12.5 mg total) by mouth 2 (two) times daily with a meal. Take one tablet by mouth daily with a meal.  . Continuous Blood Gluc Sensor (DEXCOM G6 SENSOR) MISC Place a new sensor every 10 days. Use to check blood sugar at least 4 times daily  . Continuous Blood Gluc Transmit (DEXCOM G6 TRANSMITTER) MISC Use to check glucose at least 4 times daily  . digoxin (LANOXIN) 0.125 MG tablet Take 1 tablet (125 mcg total) by mouth daily.  . enalapril (VASOTEC) 20 MG tablet Take 2 tablets (40 mg total) by mouth daily.  . Insulin Glargine (LANTUS SOLOSTAR) 100 UNIT/ML Solostar Pen Inject 40 Units into the skin daily.   . Insulin Pen Needle (B-D ULTRAFINE III SHORT PEN) 31G  X 8 MM MISC USE AS DIRECTED  . Multiple Vitamin (MULTIVITAMIN) capsule Take 1 capsule by mouth daily.    Marland Kitchen NOVOLOG FLEXPEN 100 UNIT/ML FlexPen Inject 25 Units into the skin 3 (three) times daily with meals. Take 5 units at breakfast, 10 units at lunch and 10 units at dinner.  . simvastatin (ZOCOR) 20 MG tablet Take one tablet by mouth every evening.  . hydrALAZINE (APRESOLINE) 10 MG tablet TAKE 1/2 TABLET BY MOUTH THREE TIMES A DAY AS NEEDED(TAKE IF BLOOD PRESSURE GREATER THAN 140/90) (Patient not taking: Reported on 03/30/2020)   No facility-administered encounter medications on file as of 03/30/2020.     Objective:   Goals Addressed              This Visit's Progress     Patient Stated   .  PharmD "I want to stay healthy" (pt-stated)        CARE PLAN ENTRY (see longtitudinal plan of care for additional care plan information)  Current Barriers:  . Social, community, and financial barriers: o Notes today that she would like a home health referral for a nurse to fill her weekly pill box. She notes her sister is currently doing this, but would appreciate some support. She attempted to switch medications to [facility] pharmacy for adherence packing, but had a difficult transition experience and is not interested in trying any other pharmacies that offer adherence packaging at this point, . Diabetes, type 1; complicated by chronic medical conditions including HTN, HFpEF (recovered), CAD,  CKD, most recent A1c 7.1%- follows w/ Dr. Gabriel Carina at [facility]. Stable control of DM . Most recent eGFR: 30 mL/min (per Care Everywhere) . Current antihyperglycemic regimen: Lantus 40 units daily, Novolog 10/24/08 units with meals; using DexCom CGM. o Denies concerns w/ CGM access. Covered on prescription insurance at local pahrmacy . Cardiovascular risk reduction: Follows w/ Dr. Nehemiah Massed, CHF (last EF 55% 2016, though previously <30%); CAD (cath in 2010);  o Current hypertensive regimen: carvedilol 12.5 mg BID,  enalapril 40 mg daily, digoxin 0.125 mg (0.8 ng/mL 08/2019); home BP readings ~120-140s/70s; has not required PRN hydralazine, though she notes the pharmacy keeps filling it o Current hyperlipidemia regimen: simvastatin 20 mg daily, last LDL NOT at goal <70 (71 05/2019) o Current antiplatelet regimen: aspirin 81 mg daily   Pharmacist Clinical Goal(s):  Marland Kitchen Over the next 90 days, patient will work with PharmD and primary care provider to address optimized medication management and care coordination  Interventions: . Comprehensive medication review performed, medication list updated in electronic medical record . Inter-disciplinary care team collaboration (see longitudinal plan of care) . Mailing patient a list of how to fill pill box. Will collaborate w/ PCP regarding patient's request for an order for home health for nursing for medication adherence support.  . Encouraged continued use of CGM, as this has significantly helped improvement in and maintenance of glucose control.  . Encouraged to ask [facility] to take PRN hydralazine off of automatic refill.   Patient Self Care Activities: . Patient will check glucose at least QID using CGM document, and provide at future appointments . Patient will take medications as prescribed . Patient will report any questions or concerns to provider   Please see past updates related to this goal by clicking on the "Past Updates" button in the selected goal           Plan:  - Scheduled f/u call in ~ 12 weeks (PCP visit in ~ 6 weeks)  [name], PharmD, [place], CPP Clinical Pharmacist [place] [phone]

## 2020-03-30 NOTE — Patient Instructions (Addendum)
Chelsea Barton,   It was great talking to you today!  As of our call this morning, a booster vaccine for those who originally received Levan Hurst has not been approved. Keep an eye out in the news for this moving forward.   Ask [facility] to take your hydralazine off of automatic refill, if they are doing that.   I've asked Chelsea Barton for the referral for someone to fill your pill box for you. This is how it should be filled.   Morning: - Aspirin 81 mg  - Carvedilol 12.5 mg  - Digoxin 0.125 mg  - Enalapril 20 mg   Evening: - Carvedilol 12.5 mg  - Enalapril 20 mg - Simvastatin 20 mg   Call me with any questions!  [name], PharmD [phone]  Visit Information  Goals Addressed              This Visit's Progress     Patient Stated   .  PharmD "I want to stay healthy" (pt-stated)        CARE PLAN ENTRY (see longtitudinal plan of care for additional care plan information)  Current Barriers:  . Social, community, and financial barriers: o Notes today that she would like a home health referral for a nurse to fill her weekly pill box. She notes her sister is currently doing this, but would appreciate some support. She attempted to switch medications to [facility] pharmacy for adherence packing, but had a difficult transition experience and is not interested in trying any other pharmacies that offer adherence packaging at this point, . Diabetes, type 1; complicated by chronic medical conditions including HTN, HFpEF (recovered), CAD, CKD, most recent A1c 7.1%- follows w/ Dr. Gabriel Carina at [facility]. Stable control of DM . Most recent eGFR: 30 mL/min (per Care Everywhere) . Current antihyperglycemic regimen: Lantus 40 units daily, Novolog 10/24/08 units with meals; using DexCom CGM. o Denies concerns w/ CGM access. Covered on prescription insurance at local pahrmacy . Cardiovascular risk reduction: Follows w/ Dr. Nehemiah Massed, CHF (last EF 55% 2016, though previously <30%); CAD (cath in 2010);   o Current hypertensive regimen: carvedilol 12.5 mg BID, enalapril 40 mg daily, digoxin 0.125 mg (0.8 ng/mL 08/2019); home BP readings ~120-140s/70s; has not required PRN hydralazine, though she notes the pharmacy keeps filling it o Current hyperlipidemia regimen: simvastatin 20 mg daily, last LDL NOT at goal <70 (71 05/2019) o Current antiplatelet regimen: aspirin 81 mg daily   Pharmacist Clinical Goal(s):  Marland Kitchen Over the next 90 days, patient will work with PharmD and primary care provider to address optimized medication management and care coordination  Interventions: . Comprehensive medication review performed, medication list updated in electronic medical record . Inter-disciplinary care team collaboration (see longitudinal plan of care) . Mailing patient a list of how to fill pill box. Will collaborate w/ PCP regarding patient's request for an order for home health for nursing for medication adherence support.  . Encouraged continued use of CGM, as this has significantly helped improvement in and maintenance of glucose control.  . Encouraged to ask [facility] to take PRN hydralazine off of automatic refill.   Patient Self Care Activities: . Patient will check glucose at least QID using CGM document, and provide at future appointments . Patient will take medications as prescribed . Patient will report any questions or concerns to provider   Please see past updates related to this goal by clicking on the "Past Updates" button in the selected goal  The patient verbalized understanding of instructions provided today and declined a print copy of patient instruction materials.   Plan:  - Scheduled f/u call in ~ 12 weeks (PCP visit in ~ 6 weeks)  [name], PharmD, [place], [place] Pharmacist [place] [phone]

## 2020-03-31 ENCOUNTER — Telehealth: Payer: Self-pay | Admitting: Family

## 2020-03-31 NOTE — Progress Notes (Signed)
I have collaborated with the care management provider regarding care management and care coordination activities outlined in this encounter and have reviewed this encounter including documentation in the note and care plan. I am certifying that I agree with the content of this note and encounter as primary care provider.   Mable Paris, NP

## 2020-04-03 NOTE — Telephone Encounter (Signed)
close

## 2020-04-10 ENCOUNTER — Telehealth: Payer: Self-pay

## 2020-04-10 NOTE — Telephone Encounter (Signed)
I called [phone] number provided on patient's PA request for Dexcom G6 sensors. PA was already approved 04/07/20-10/06/20. Patient knows to call pharmacy to have then run script again so patient can pick up at $0 copay.

## 2020-04-18 ENCOUNTER — Other Ambulatory Visit: Payer: Self-pay

## 2020-04-18 ENCOUNTER — Encounter: Payer: Self-pay | Admitting: Family

## 2020-04-18 ENCOUNTER — Ambulatory Visit: Payer: Medicaid Other | Admitting: Family

## 2020-04-18 VITALS — BP 170/90 | HR 68 | Temp 97.8°F | Ht 65.0 in | Wt 203.6 lb

## 2020-04-18 DIAGNOSIS — E1039 Type 1 diabetes mellitus with other diabetic ophthalmic complication: Secondary | ICD-10-CM | POA: Diagnosis not present

## 2020-04-18 DIAGNOSIS — Z1231 Encounter for screening mammogram for malignant neoplasm of breast: Secondary | ICD-10-CM | POA: Diagnosis not present

## 2020-04-18 DIAGNOSIS — I2510 Atherosclerotic heart disease of native coronary artery without angina pectoris: Secondary | ICD-10-CM

## 2020-04-18 DIAGNOSIS — E785 Hyperlipidemia, unspecified: Secondary | ICD-10-CM

## 2020-04-18 DIAGNOSIS — I10 Essential (primary) hypertension: Secondary | ICD-10-CM | POA: Diagnosis not present

## 2020-04-18 LAB — POCT GLYCOSYLATED HEMOGLOBIN (HGB A1C): Hemoglobin A1C: 7.3 % — AB (ref 4.0–5.6)

## 2020-04-18 NOTE — Assessment & Plan Note (Addendum)
Uncontrolled. Suspect sodium indiscretion. No signs or symptoms of hypertensive emergency. Advised to continue carvediolol 12.5mg  BID, enalapril 40 qd, digoxin 0.125mg  ; advised for her to take hydralazine 5mg  PRN BP > 140/90 and recheck in 8 hours. Symptoms such as CP, SOB, HA , vision changes, in which to call 911 where discussed with patient as well. Home Health order placed today for medication adherence and safety.

## 2020-04-18 NOTE — Progress Notes (Signed)
Subjective:    Patient ID: Chelsea Barton, female    DOB: Jun 28, 1960, 59 y.o.   MRN: [id number]  CC: Chelsea Barton is a 59 y.o. female who presents today for follow up.   HPI: Feels well  No complaints today  She would like orders for home health to help with medication adherence, primarily to help fill her pill boxes,  due to right vision loss . She is worried about making a mistake since her vision is poor.  She She wears glasses.   Sister is filling pill boxes right now since home health has stopped months ago.   HTN- ate bacon today and thinks blood pressure is elevated as result of. At home 120/80, typically 'well controlled.'  Denies exertional chest pain or pressure, numbness or tingling radiating to left arm or jaw, palpitations, dizziness, frequent headaches, changes in vision, or shortness of breath. Hasnt taken hydralazine in months.   Follows with Dr Nehemiah Massed; last consult 07/2019  Compliant with carvediolol 12.5mg  BID, enalapril 40 qd, digoxin 0.125mg  , hydralazine 10mg  PRN BP > 140/90   Compliant with aspirin 81mg    CKD- follows with [facility]  HLD - compliant with zocor  DM1  Follows with Dr Gabriel Carina  04/20/20 CT lung cancer screening.  Crt 02/22/20 2.05  HISTORY:  Past Medical History:  Diagnosis Date  . Diabetes mellitus   . Hyperlipidemia   . Hypertension    Past Surgical History:  Procedure Laterality Date  . CATARACT EXTRACTION    . eye surgery r Right    lense placed   Family History  Problem Relation Age of Onset  . Diabetes Sister        3/5 sisters have DM  . Breast cancer Neg Hx   . Colon cancer Neg Hx     Allergies: Patient has no known allergies. Current Outpatient Medications on File Prior to Visit  Medication Sig Dispense Refill  . aspirin 81 MG EC tablet Take 1 tablet (81 mg total) by mouth daily. Swallow whole. 30 tablet 12  . carvedilol (COREG) 12.5 MG tablet Take 1 tablet (12.5 mg total) by mouth 2 (two) times daily with a meal. Take  one tablet by mouth daily with a meal. 180 tablet 1  . Continuous Blood Gluc Sensor (DEXCOM G6 SENSOR) MISC Place a new sensor every 10 days. Use to check blood sugar at least 4 times daily 9 each 3  . Continuous Blood Gluc Transmit (DEXCOM G6 TRANSMITTER) MISC Use to check glucose at least 4 times daily 1 each 3  . digoxin (LANOXIN) 0.125 MG tablet Take 1 tablet (125 mcg total) by mouth daily. 90 tablet 1  . enalapril (VASOTEC) 20 MG tablet Take 2 tablets (40 mg total) by mouth daily. 180 tablet 3  . hydrALAZINE (APRESOLINE) 10 MG tablet TAKE 1/2 TABLET BY MOUTH THREE TIMES A DAY AS NEEDED(TAKE IF BLOOD PRESSURE GREATER THAN 140/90) 30 tablet 1  . Insulin Glargine (LANTUS SOLOSTAR) 100 UNIT/ML Solostar Pen Inject 40 Units into the skin daily.     . Insulin Pen Needle (B-D ULTRAFINE III SHORT PEN) 31G X 8 MM MISC USE AS DIRECTED 100 each 6  . Multiple Vitamin (MULTIVITAMIN) capsule Take 1 capsule by mouth daily.      Marland Kitchen NOVOLOG FLEXPEN 100 UNIT/ML FlexPen Inject 25 Units into the skin 3 (three) times daily with meals. Take 5 units at breakfast, 10 units at lunch and 10 units at dinner.    . simvastatin (ZOCOR)  20 MG tablet Take one tablet by mouth every evening. 90 tablet 1   No current facility-administered medications on file prior to visit.    Social History   Tobacco Use  . Smoking status: Former Smoker    Quit date: 06/18/2005    Years since quitting: 14.8  . Smokeless tobacco: Never Used  Substance Use Topics  . Alcohol use: No  . Drug use: No    Review of Systems  Constitutional: Negative for chills and fever.  Eyes: Positive for visual disturbance (chronic right eye vision loss).  Respiratory: Negative for cough.   Cardiovascular: Negative for chest pain and palpitations.  Gastrointestinal: Negative for nausea and vomiting.      Objective:    BP (!) 182/90   Pulse 68   Temp 97.8 F (36.6 C) (Oral)   Ht 5\' 5"  (1.651 m)   Wt 203 lb 9.6 oz (92.4 kg)   SpO2 96%   BMI  33.88 kg/m  BP Readings from Last 3 Encounters:  04/18/20 (!) 182/90  01/14/20 (!) 150/82  10/13/19 140/76   Wt Readings from Last 3 Encounters:  04/18/20 203 lb 9.6 oz (92.4 kg)  01/14/20 (!) 205 lb 12.8 oz (93.4 kg)  10/13/19 207 lb (93.9 kg)    Physical Exam Vitals reviewed.  Constitutional:      Appearance: She is well-developed.  HENT:     Mouth/Throat:     Pharynx: Uvula midline.  Eyes:     Conjunctiva/sclera: Conjunctivae normal.     Pupils: Pupils are equal, round, and reactive to light.     Comments: Fundus normal bilaterally.   Cardiovascular:     Rate and Rhythm: Normal rate and regular rhythm.     Pulses: Normal pulses.     Heart sounds: Normal heart sounds.  Pulmonary:     Effort: Pulmonary effort is normal.     Breath sounds: Normal breath sounds. No wheezing, rhonchi or rales.  Skin:    General: Skin is warm and dry.  Neurological:     Mental Status: She is alert.     Cranial Nerves: No cranial nerve deficit.     Sensory: No sensory deficit.     Deep Tendon Reflexes:     Reflex Scores:      Bicep reflexes are 2+ on the right side and 2+ on the left side.      Patellar reflexes are 2+ on the right side and 2+ on the left side.    Comments: Grip equal and strong bilateral upper extremities. Gait strong and steady. Able to perform rapid alternating movement without difficulty.   Psychiatric:        Speech: Speech normal.        Behavior: Behavior normal.        Thought Content: Thought content normal.        Assessment & Plan:   Problem List Items Addressed This Visit      Cardiovascular and Mediastinum   CAD (coronary artery disease)    Overdue to follow up with Dr Nehemiah Massed. Advised to call his office to schedule follow up.       Hypertension - Primary     Uncontrolled. Suspect sodium indiscretion. No signs or symptoms of hypertensive emergency. Advised to continue carvediolol 12.5mg  BID, enalapril 40 qd, digoxin 0.125mg  ; advised for her to  take hydralazine 5mg  PRN BP > 140/90 and recheck in 8 hours. Symptoms such as CP, SOB, HA , vision changes, in which to call  911 where discussed with patient as well. Home Health order placed today for medication adherence and safety.       Relevant Orders   Ambulatory referral to [facility]   DM type 1 (diabetes mellitus, type 1) ([place])   Relevant Orders   POCT HgB A1C (Completed)     Other   Hyperlipidemia    Controlled. Continue zocor. Will update lipid panel when she is fasting at CPE.       Other Visit Diagnoses    Encounter for screening mammogram for malignant neoplasm of breast       Relevant Orders   MM 3D SCREEN BREAST BILATERAL       I am having Zeinab D. Cirillo maintain her multivitamin, NovoLOG FlexPen, insulin glargine, B-D ULTRAFINE III SHORT PEN, Dexcom G6 Sensor, Dexcom G6 Transmitter, simvastatin, enalapril, digoxin, carvedilol, aspirin, and hydrALAZINE.   No orders of the defined types were placed in this encounter.   Return precautions given.   Risks, benefits, and alternatives of the medications and treatment plan prescribed today were discussed, and patient expressed understanding.   Education regarding symptom management and diagnosis given to patient on AVS.  Continue to follow with Burnard Hawthorne, FNP for routine health maintenance.   Shaili D Hedtke and I agreed with plan.   Mable Paris, FNP

## 2020-04-18 NOTE — Assessment & Plan Note (Signed)
Uncontrolled. Lab Results  Component Value Date   HGBA1C 7.3 (A) 04/18/2020   She has follow up with Dr Gabriel Carina next week and will defer changes to her next week.

## 2020-04-18 NOTE — Assessment & Plan Note (Signed)
Overdue to follow up with Dr Nehemiah Massed. Advised to call his office to schedule follow up.

## 2020-04-18 NOTE — Progress Notes (Signed)
Pre visit review using our clinic review tool, if applicable. No additional management support is needed unless otherwise documented below in the visit note. 

## 2020-04-18 NOTE — Patient Instructions (Addendum)
Please check blood pressure at home when resting if 140/80, please take hydralazine 5mg  tablet. Please check again in 8 hours and may take an additional 5mg  tablet 8 hours later if still elevated.  Please avoid bacon :)  If you develop chest pain, headache , vision changes, please call 911.    Due to see cardiology, Dr Nehemiah Massed, please call below to schedule your follow up  Flossie Dibble, MD  [address]  [facility]  [place], [place] [address]  Phone: [phone]   Please call  and schedule your 3D mammogram as discussed.   [place]  [place], [place]

## 2020-04-18 NOTE — Assessment & Plan Note (Signed)
Controlled. Continue zocor. Will update lipid panel when she is fasting at CPE.

## 2020-04-20 ENCOUNTER — Other Ambulatory Visit: Payer: Self-pay

## 2020-04-20 ENCOUNTER — Ambulatory Visit
Admission: RE | Admit: 2020-04-20 | Discharge: 2020-04-20 | Disposition: A | Discharge status: Home / Self Care | Payer: Medicaid Other | Source: Ambulatory Visit | Attending: Nurse Practitioner | Admitting: Nurse Practitioner

## 2020-04-20 DIAGNOSIS — Z87891 Personal history of nicotine dependence: Secondary | ICD-10-CM | POA: Insufficient documentation

## 2020-04-20 DIAGNOSIS — Z122 Encounter for screening for malignant neoplasm of respiratory organs: Secondary | ICD-10-CM | POA: Insufficient documentation

## 2020-04-22 ENCOUNTER — Encounter: Payer: Self-pay | Admitting: *Deleted

## 2020-04-24 DIAGNOSIS — E1021 Type 1 diabetes mellitus with diabetic nephropathy: Secondary | ICD-10-CM | POA: Diagnosis not present

## 2020-04-24 DIAGNOSIS — R809 Proteinuria, unspecified: Secondary | ICD-10-CM | POA: Diagnosis not present

## 2020-04-24 DIAGNOSIS — E1042 Type 1 diabetes mellitus with diabetic polyneuropathy: Secondary | ICD-10-CM | POA: Diagnosis not present

## 2020-04-24 DIAGNOSIS — E1029 Type 1 diabetes mellitus with other diabetic kidney complication: Secondary | ICD-10-CM | POA: Diagnosis not present

## 2020-04-24 DIAGNOSIS — E103553 Type 1 diabetes mellitus with stable proliferative diabetic retinopathy, bilateral: Secondary | ICD-10-CM | POA: Diagnosis not present

## 2020-04-25 ENCOUNTER — Telehealth: Payer: Self-pay | Admitting: Family

## 2020-04-25 NOTE — Telephone Encounter (Signed)
Call pt CT lung cancer shows benign appearance of lungs Continue to see esophageal thickening.  As we discussed in the past, I advise GI consult for evaluation of esophagitis.  Is she agreeable? Does she have any trouble or pain with swallowing, epigastric burning? CT also shows trace fluid around heart and enlarged heart and we are going to send report to Dr Nehemiah Massed   Please call Dr Nehemiah Massed and advice that trace effusion, cardiomegaly and tortuous thoracic aorta seen on CT chest lung cancer screen; we are sending to his office for review.  Please  then fax results ( this is fax I found online, please check accurate when you call)  [facility] [place], [place] [address] Office: [phone]  Fax: [phone]

## 2020-04-25 NOTE — Telephone Encounter (Signed)
Can you help me navigate this?  See note from [place], insurance will not cover home health to help with medications.   Does patient want to do a pill pack? Would that be helpful?   Which pharmacy does the pill pack?

## 2020-04-25 NOTE — Telephone Encounter (Signed)
-----   Message from Lieutenant Diego, RN sent at 04/22/2020 10:05 AM EDT ----- We will send interpretation of these results to the patient. We will also contact them close to the time their annual lung screening scan is due next year for scheduling.  Thank you, Raquel Sarna

## 2020-04-25 NOTE — Telephone Encounter (Signed)
-----   Message from Ashley Jacobs sent at 04/20/2020 11:09 AM EDT ----- Regarding: Home health referral Good morning!  Pt has [place], [facility] and they do not manage [place] so I contacted pt insurance and was given some agencies and they was not able to take [facility] either. I was instructed to call [facility] to see if they can assist with her pills and they lady suggested doing prepackage medication which is called pill pack they way she does not have to see which medication is what each day has the pills she needs to take. She also said they may deliver also. Please advise and Thank you!

## 2020-04-25 NOTE — Telephone Encounter (Signed)
Patient has been set up with [facility] for their bubble packs. I had sent all prescriptions to them & patient was agreeable at the time. I was notified a couple weeks later that she no longer wanted to do this. She wanted all scripts sent back to [facility] which [facility] was able to do for her.

## 2020-04-25 NOTE — Telephone Encounter (Signed)
Just FYI. Patient once again refused GI referral. She stated that she did not have the money to see another specialist. She is not having any sx such as trouble swallowing, pain swallowing or epigastric burning. Also informed pt was noted on CT regarding her heart. Pt was fine with [name] faxing CT to Dr. Alveria Apley office.  I have faxed to their office after calling & asking for message to be sent back to his nurse to be on the lookout for fax of CT lung scan.

## 2020-04-26 NOTE — Telephone Encounter (Signed)
noted 

## 2020-04-26 NOTE — Telephone Encounter (Signed)
close

## 2020-05-01 NOTE — Telephone Encounter (Signed)
Please confirm dr Erin Fulling rec'ed CT chest result , as it showed cardiomegaly and trace pericardial fluid

## 2020-05-01 NOTE — Telephone Encounter (Signed)
I spokw with Dr. Alveria Apley office. His nurse Sharyn Lull had put a note in [place] regarding the CT. She said that she added to patients appointment notes also to review that with patient when she is there 12/1 for her appointment.

## 2020-05-17 DIAGNOSIS — I700 Atherosclerosis of aorta: Secondary | ICD-10-CM | POA: Diagnosis not present

## 2020-05-17 DIAGNOSIS — I10 Essential (primary) hypertension: Secondary | ICD-10-CM | POA: Diagnosis not present

## 2020-05-17 DIAGNOSIS — I25118 Atherosclerotic heart disease of native coronary artery with other forms of angina pectoris: Secondary | ICD-10-CM | POA: Diagnosis not present

## 2020-05-17 DIAGNOSIS — E782 Mixed hyperlipidemia: Secondary | ICD-10-CM | POA: Diagnosis not present

## 2020-05-18 ENCOUNTER — Telehealth: Payer: Self-pay | Admitting: Pharmacist

## 2020-05-18 NOTE — Telephone Encounter (Signed)
  Chronic Care Management   Note  05/18/2020 Name: EUPHA LOBB MRN: [id number] DOB: March 26, 1961  Received voicemail from patient asking me to call her, but she did not not state a reason.   Attempted to call patient back, unable to LVM.   [name], PharmD, [place], CPP Clinical Pharmacist [place] [phone]

## 2020-05-18 NOTE — Telephone Encounter (Signed)
  Chronic Care Management   Note  05/18/2020 Name: Chelsea Barton MRN: [id number] DOB: [birth year]   Patient called me, LVM that she had questions about [employer]. Attempted to call patient back, phone rang for a minute and I was unable to leave a voicemail.   Routing to PCP and CMA for follow up

## 2020-05-18 NOTE — Telephone Encounter (Signed)
I called and LM with patient's sister since pt was  not home. She will have pt give [name] a call back.

## 2020-05-25 ENCOUNTER — Ambulatory Visit
Admission: RE | Admit: 2020-05-25 | Discharge: 2020-05-25 | Disposition: A | Discharge status: Home / Self Care | Payer: Medicaid Other | Source: Ambulatory Visit | Attending: Family | Admitting: Family

## 2020-05-25 ENCOUNTER — Other Ambulatory Visit: Payer: Self-pay

## 2020-05-25 DIAGNOSIS — Z1231 Encounter for screening mammogram for malignant neoplasm of breast: Secondary | ICD-10-CM | POA: Diagnosis not present

## 2020-06-21 ENCOUNTER — Encounter: Payer: Medicaid Other | Admitting: Family

## 2020-06-22 ENCOUNTER — Other Ambulatory Visit: Payer: Self-pay

## 2020-06-22 ENCOUNTER — Encounter: Payer: Self-pay | Admitting: Family

## 2020-06-22 ENCOUNTER — Ambulatory Visit (INDEPENDENT_AMBULATORY_CARE_PROVIDER_SITE_OTHER): Payer: Medicaid Other | Admitting: Family

## 2020-06-22 ENCOUNTER — Telehealth: Payer: Medicaid Other

## 2020-06-22 ENCOUNTER — Ambulatory Visit: Payer: Medicaid Other | Admitting: Pharmacist

## 2020-06-22 VITALS — BP 114/72 | HR 65 | Temp 98.2°F | Ht 65.0 in | Wt 196.0 lb

## 2020-06-22 DIAGNOSIS — Z0000 Encounter for general adult medical examination without abnormal findings: Secondary | ICD-10-CM

## 2020-06-22 DIAGNOSIS — I2510 Atherosclerotic heart disease of native coronary artery without angina pectoris: Secondary | ICD-10-CM

## 2020-06-22 DIAGNOSIS — E785 Hyperlipidemia, unspecified: Secondary | ICD-10-CM

## 2020-06-22 DIAGNOSIS — E1065 Type 1 diabetes mellitus with hyperglycemia: Secondary | ICD-10-CM | POA: Diagnosis not present

## 2020-06-22 DIAGNOSIS — I10 Essential (primary) hypertension: Secondary | ICD-10-CM | POA: Diagnosis not present

## 2020-06-22 DIAGNOSIS — I700 Atherosclerosis of aorta: Secondary | ICD-10-CM | POA: Insufficient documentation

## 2020-06-22 DIAGNOSIS — N189 Chronic kidney disease, unspecified: Secondary | ICD-10-CM

## 2020-06-22 DIAGNOSIS — E1039 Type 1 diabetes mellitus with other diabetic ophthalmic complication: Secondary | ICD-10-CM

## 2020-06-22 LAB — COMPREHENSIVE METABOLIC PANEL
ALT: 12 U/L (ref 0–35)
AST: 15 U/L (ref 0–37)
Albumin: 3.8 g/dL (ref 3.5–5.2)
Alkaline Phosphatase: 71 U/L (ref 39–117)
BUN: 31 mg/dL — ABNORMAL HIGH (ref 6–23)
CO2: 32 mEq/L (ref 19–32)
Calcium: 9.6 mg/dL (ref 8.4–10.5)
Chloride: 103 mEq/L (ref 96–112)
Creatinine, Ser: 2.56 mg/dL — ABNORMAL HIGH (ref 0.40–1.20)
GFR: 19.77 mL/min — ABNORMAL LOW (ref >60.00)
Glucose, Bld: 212 mg/dL — ABNORMAL HIGH (ref 70–99)
Potassium: 5.1 mEq/L (ref 3.5–5.1)
Sodium: 140 mEq/L (ref 135–145)
Total Bilirubin: 0.5 mg/dL (ref 0.2–1.2)
Total Protein: 6.8 g/dL (ref 6.0–8.3)

## 2020-06-22 LAB — CBC WITH DIFFERENTIAL/PLATELET
Basophils Absolute: 0.1 10*3/uL (ref 0.0–0.1)
Basophils Relative: 0.7 % (ref 0.0–3.0)
Eosinophils Absolute: 0.4 10*3/uL (ref 0.0–0.7)
Eosinophils Relative: 4.8 % (ref 0.0–5.0)
HCT: 41.2 % (ref 36.0–46.0)
Hemoglobin: 13.2 g/dL (ref 12.0–15.0)
Lymphocytes Relative: 21 % (ref 12.0–46.0)
Lymphs Abs: 1.8 10*3/uL (ref 0.7–4.0)
MCHC: 31.9 g/dL (ref 30.0–36.0)
MCV: 83.6 fl (ref 78.0–100.0)
Monocytes Absolute: 0.9 10*3/uL (ref 0.1–1.0)
Monocytes Relative: 9.7 % (ref 3.0–12.0)
Neutro Abs: 5.6 10*3/uL (ref 1.4–7.7)
Neutrophils Relative %: 63.8 % (ref 43.0–77.0)
Platelets: 216 10*3/uL (ref 150.0–400.0)
RBC: 4.93 Mil/uL (ref 3.87–5.11)
RDW: 14.1 % (ref 11.5–15.5)
WBC: 8.7 10*3/uL (ref 4.0–10.5)

## 2020-06-22 LAB — LIPID PANEL
Cholesterol: 133 mg/dL (ref 0–200)
HDL: 41.6 mg/dL (ref >39.00)
LDL Cholesterol: 64 mg/dL (ref 0–99)
NonHDL: 91.49
Total CHOL/HDL Ratio: 3
Triglycerides: 135 mg/dL (ref 0.0–149.0)
VLDL: 27 mg/dL (ref 0.0–40.0)

## 2020-06-22 LAB — TSH: TSH: 1.87 u[IU]/mL (ref 0.35–4.50)

## 2020-06-22 LAB — VITAMIN D 25 HYDROXY (VIT D DEFICIENCY, FRACTURES): VITD: 33.57 ng/mL (ref 30.00–100.00)

## 2020-06-22 NOTE — Assessment & Plan Note (Signed)
Presume controlled. Pending lipid panel. Continue zocor 20mg .

## 2020-06-22 NOTE — Patient Instructions (Signed)
Nice to see you!   Health Maintenance for Postmenopausal Women Menopause is a normal process in which your ability to get pregnant comes to an end. This process happens slowly over many months or years, usually between the ages of 59 and 43. Menopause is complete when you have missed your menstrual periods for 12 months. It is important to talk with your health care provider about some of the most common conditions that affect women after menopause (postmenopausal women). These include heart disease, cancer, and bone loss (osteoporosis). Adopting a healthy lifestyle and getting preventive care can help to promote your health and wellness. The actions you take can also lower your chances of developing some of these common conditions. What should I know about menopause? During menopause, you may get a number of symptoms, such as:  Hot flashes. These can be moderate or severe.  Night sweats.  Decrease in sex drive.  Mood swings.  Headaches.  Tiredness.  Irritability.  Memory problems.  Insomnia. Choosing to treat or not to treat these symptoms is a decision that you make with your health care provider. Do I need hormone replacement therapy?  Hormone replacement therapy is effective in treating symptoms that are caused by menopause, such as hot flashes and night sweats.  Hormone replacement carries certain risks, especially as you become older. If you are thinking about using estrogen or estrogen with progestin, discuss the benefits and risks with your health care provider. What is my risk for heart disease and stroke? The risk of heart disease, heart attack, and stroke increases as you age. One of the causes may be a change in the body's hormones during menopause. This can affect how your body uses dietary fats, triglycerides, and cholesterol. Heart attack and stroke are medical emergencies. There are many things that you can do to help prevent heart disease and stroke. Watch your  blood pressure  High blood pressure causes heart disease and increases the risk of stroke. This is more likely to develop in people who have high blood pressure readings, are of African descent, or are overweight.  Have your blood pressure checked: ? Every 3-5 years if you are 81-68 years of age. ? Every year if you are 64 years old or older. Eat a healthy diet   Eat a diet that includes plenty of vegetables, fruits, low-fat dairy products, and lean protein.  Do not eat a lot of foods that are high in solid fats, added sugars, or sodium. Get regular exercise Get regular exercise. This is one of the most important things you can do for your health. Most adults should:  Try to exercise for at least 150 minutes each week. The exercise should increase your heart rate and make you sweat (moderate-intensity exercise).  Try to do strengthening exercises at least twice each week. Do these in addition to the moderate-intensity exercise.  Spend less time sitting. Even light physical activity can be beneficial. Other tips  Work with your health care provider to achieve or maintain a healthy weight.  Do not use any products that contain nicotine or tobacco, such as cigarettes, e-cigarettes, and chewing tobacco. If you need help quitting, ask your health care provider.  Know your numbers. Ask your health care provider to check your cholesterol and your blood sugar (glucose). Continue to have your blood tested as directed by your health care provider. Do I need screening for cancer? Depending on your health history and family history, you may need to have cancer screening at  different stages of your life. This may include screening for:  Breast cancer.  Cervical cancer.  Lung cancer.  Colorectal cancer. What is my risk for osteoporosis? After menopause, you may be at increased risk for osteoporosis. Osteoporosis is a condition in which bone destruction happens more quickly than new bone  creation. To help prevent osteoporosis or the bone fractures that can happen because of osteoporosis, you may take the following actions:  If you are 41-13 years old, get at least 1,000 mg of calcium and at least 600 mg of vitamin D per day.  If you are older than age 16 but younger than age 67, get at least 1,200 mg of calcium and at least 600 mg of vitamin D per day.  If you are older than age 19, get at least 1,200 mg of calcium and at least 800 mg of vitamin D per day. Smoking and drinking excessive alcohol increase the risk of osteoporosis. Eat foods that are rich in calcium and vitamin D, and do weight-bearing exercises several times each week as directed by your health care provider. How does menopause affect my mental health? Depression may occur at any age, but it is more common as you become older. Common symptoms of depression include:  Low or sad mood.  Changes in sleep patterns.  Changes in appetite or eating patterns.  Feeling an overall lack of motivation or enjoyment of activities that you previously enjoyed.  Frequent crying spells. Talk with your health care provider if you think that you are experiencing depression. General instructions See your health care provider for regular wellness exams and vaccines. This may include:  Scheduling regular health, dental, and eye exams.  Getting and maintaining your vaccines. These include: ? Influenza vaccine. Get this vaccine each year before the flu season begins. ? Pneumonia vaccine. ? Shingles vaccine. ? Tetanus, diphtheria, and pertussis (Tdap) booster vaccine. Your health care provider may also recommend other immunizations. Tell your health care provider if you have ever been abused or do not feel safe at home. Summary  Menopause is a normal process in which your ability to get pregnant comes to an end.  This condition causes hot flashes, night sweats, decreased interest in sex, mood swings, headaches, or lack of  sleep.  Treatment for this condition may include hormone replacement therapy.  Take actions to keep yourself healthy, including exercising regularly, eating a healthy diet, watching your weight, and checking your blood pressure and blood sugar levels.  Get screened for cancer and depression. Make sure that you are up to date with all your vaccines. This information is not intended to replace advice given to you by your health care provider. Make sure you discuss any questions you have with your health care provider. Document Revised: 05/27/2018 Document Reviewed: 05/27/2018 Elsevier Patient Education  2020 [employer].

## 2020-06-22 NOTE — Assessment & Plan Note (Signed)
Stable. Continue current regimen as prescribed by cardiology.

## 2020-06-22 NOTE — Assessment & Plan Note (Addendum)
Pap UTD and she declines pelvic exam in the absence of complaints. Declines CBE today. Emphasized importance of CBE at home. Mammogram UTD. Encouraged screening for colon cancer and patient declines screening whether colonscopy or Cologuard. Encouraged continued exercise.

## 2020-06-22 NOTE — Chronic Care Management (AMB) (Cosign Needed)
Care Management   Pharmacy Note  06/22/2020 Name: Chelsea Barton MRN: [id number] DOB: [birth year]  Chelsea Barton is a 60 y.o. year old female who is a primary care patient of Burnard Hawthorne, FNP. The Care Management/Care Coordination team team was consulted for assistance with care management and care coordination needs.    Engaged with patient face to face for follow up visit in response to provider referral for pharmacy case management and/or care coordination services.   Consent to Services:  Patient was given information about care management/care coordination services, agreed to services, and gave verbal consent prior to initiation of services. Please see initial visit note for detailed documentation.   Review of patient status, including review of consultants reports, laboratory and other test data, was performed as part of comprehensive evaluation and provision of chronic care management services.   SDOH (Social Determinants of Health) assessments and interventions performed:  SDOH Interventions   Flowsheet Row Most Recent Value  SDOH Interventions   Financial Strain Interventions Intervention Not Indicated       Objective:  Lab Results  Component Value Date   CREATININE 1.27 (H) 11/14/2017   CREATININE 1.49 (H) 10/29/2017   CREATININE 1.40 (H) 09/22/2017    Lab Results  Component Value Date   HGBA1C 7.3 (A) 04/18/2020       Component Value Date/Time   CHOL 135 09/22/2017 0843   TRIG 67.0 09/22/2017 0843   HDL 52.50 09/22/2017 0843   CHOLHDL 3 09/22/2017 0843   VLDL 13.4 09/22/2017 0843   LDLCALC 70 09/22/2017 0843   LDLDIRECT 84.9 09/10/2012 1628     BP Readings from Last 3 Encounters:  06/22/20 114/72  04/18/20 (!) 170/90  01/14/20 (!) 150/82    Care Plan  No Known Allergies  Medications Reviewed Today    Reviewed by Burnard Hawthorne, FNP (Family Nurse Practitioner) on 06/22/20 at [phone]  Med List Status: <None>  Medication Order Taking? Sig  Documenting Provider Last Dose Status Informant  aspirin 81 MG EC tablet [id number] Yes Take 1 tablet (81 mg total) by mouth daily. Swallow whole. Burnard Hawthorne, FNP Taking Active   carvedilol (COREG) 12.5 MG tablet [id number] Yes Take 1 tablet (12.5 mg total) by mouth 2 (two) times daily with a meal. Take one tablet by mouth daily with a meal. Burnard Hawthorne, FNP Taking Active   Continuous Blood Gluc Sensor ([place]) MISC [id number] Yes Place a new sensor every 10 days. Use to check blood sugar at least 4 times daily Burnard Hawthorne, FNP Taking Active   Continuous Blood Gluc Transmit (DEXCOM G6 TRANSMITTER) MISC [id number] Yes Use to check glucose at least 4 times daily Burnard Hawthorne, FNP Taking Active   dapagliflozin propanediol (FARXIGA) 10 MG TABS tablet [id number]  Take 10 mg by mouth daily. [provider]  Active   digoxin (LANOXIN) 0.125 MG tablet [id number] Yes Take 1 tablet (125 mcg total) by mouth daily. Burnard Hawthorne, FNP Taking Active   enalapril (VASOTEC) 20 MG tablet [id number] Yes Take 2 tablets (40 mg total) by mouth daily. Burnard Hawthorne, FNP Taking Active   hydrALAZINE (APRESOLINE) 10 MG tablet [id number] Yes TAKE 1/2 TABLET BY MOUTH THREE TIMES A DAY AS NEEDED(TAKE IF BLOOD PRESSURE GREATER THAN 140/90) Burnard Hawthorne, FNP Taking Active            Med Note Darnelle Maffucci, Maralyn Sago Jun 22, 2020  8:34 AM) Taking 10 mg BID  insulin glargine (LANTUS) 100 UNIT/ML Solostar Pen [id number] Yes Inject 40 Units into the skin daily.  [provider] Taking Active   Insulin Pen Needle (B-D ULTRAFINE III SHORT PEN) 31G X 8 MM MISC [id number] Yes USE AS DIRECTED Burnard Hawthorne, FNP Taking Active   Multiple Vitamin (MULTIVITAMIN) capsule [id number] Yes Take 1 capsule by mouth daily. [provider] Taking Active   NOVOLOG FLEXPEN 100 UNIT/ML FlexPen [id number] Yes Inject 25 Units into the skin 3 (three) times daily with meals. Take 5 units  at breakfast, 10 units at lunch and 10 units at dinner. [provider] Taking Active   simvastatin (ZOCOR) 20 MG tablet [id number] Yes Take one tablet by mouth every evening. Burnard Hawthorne, FNP Taking Active           Patient Active Problem List   Diagnosis Date Noted  . Aortic atherosclerosis ([place]) 06/22/2020  . Benign hypertensive kidney disease with chronic kidney disease 08/17/2019  . Proteinuria 08/17/2019  . Secondary hyperparathyroidism of renal origin ([place]) 08/17/2019  . Proliferative diabetic retinopathy associated with type 1 diabetes mellitus ([place]) 03/25/2019  . Esophageal thickening 03/18/2019  . Pain due to onychomycosis of toenails of both feet 12/14/2018  . Diabetic neuropathy ([place]) 12/14/2018  . Long-term insulin use ([place]) 08/03/2018  . CKD (chronic kidney disease) 11/20/2016  . CAD (coronary artery disease) 11/20/2016  . Mitral valve disease 11/20/2016  . Impaired vision 11/20/2016  . Moderate mitral insufficiency 03/06/2015  . DM type 1 with diabetic peripheral neuropathy ([place]) 03/01/2015  . Uncontrolled type 1 diabetes mellitus with hyperglycemia, with long-term current use of insulin ([place]) 03/01/2015  . Benign essential hypertension 10/03/2014  . Routine general medical examination at a health care facility 12/14/2012  . Obesity 12/14/2012  . Hypertension 04/01/2011  . Hyperlipidemia 04/01/2011  . DM type 1 (diabetes mellitus, type 1) ([place]) 04/01/2011    Conditions to be addressed/monitored: CHF, CAD, HTN, HLD and DM  Patient Care Plan: General Pharmacy (Adult)    Problem Identified: T1DM, CKD, HF, HTN     Long-Range Goal: Disease Progression Prevention   This Visit's Progress: On track  Priority: High  Note:   Current Barriers:  . Complex patient with multiple comorbidities including DM, HF, CKD  Pharmacist Clinical Goal(s):  Marland Kitchen Over the next 90 days, patient will achieve adherence to monitoring guidelines and medication adherence to  achieve therapeutic efficacy through collaboration with PharmD and provider.   Interventions: . 1:1 collaboration with Burnard Hawthorne, FNP regarding development and update of comprehensive plan of care as evidenced by provider attestation and co-signature . Inter-disciplinary care team collaboration (see longitudinal plan of care) . Comprehensive medication review performed; medication list updated in electronic medical record  Diabetes: . Controlled for more stringent goal of ~7.5% or less; current treatment: Lantus 40 units daily, Novolog 5 units with breakfast, 10 units with lunch and supper; Wilder Glade recently added by cardiology for CKD/HF  . Denies hypoglycemic symptoms . Reviewed importance of remaining adequately hydrated w/ SGLT2, given data of SGLT2 for DM in patients with T1DM and increased risk of DKA. Patient verbalized understanding . Reviewed need for eye exam.   CHF, HTN in the setting of CKD: . Controlled BP in clinic today; current treatment: enalapril 40 mg daily, carvedilol 12.5 mg BID, hydralazine 10 mg BID, digoxin 0.125 mg daily, Farxiga 10 mg daily; follows w/ cardiology Dr. Nehemiah Massed, cardiology Dr. Juleen China . Current home readings: reports that she needs to replace the batteries in  her home machine . Continue current regimen at this time along with cardiology and nephrology f/u.   CAD, Hyperlipidemia: . Near goal LDL <70; current treatment: simvastatin 20 mg daily. Overdue for lipid panel, but cardiology f/u next month . Antiplatelet regimen: aspirin 81 mg daily . Consider more stringent LDL goal <70. Consider dose increase of simvastatin to 40 mg daily vs change to higher potency statin  Patient Goals/Self-Care Activities . Over the next 90 days, patient will:  - take medications as prescribed check glucose using CGM, document, and provide at future appointments check blood pressure daily, document, and provide at future appointments  Follow Up Plan: Telephone  follow up appointment with care management team member scheduled for: ~ 8 weeks       Medication Assistance: None required. Patient affirms current coverage meets needs.   Plan: Telephone follow up appointment with care management team member scheduled for:~ 8 weeks  [name], PharmD, [place], CPP Clinical Pharmacist No [facility] at [place]  I have collaborated with the care management provider regarding care management and care coordination activities outlined in this encounter and have reviewed this encounter including documentation in the note and care plan. I am certifying that I agree with the content of this note and encounter as primary care provider.   Mable Paris, NP

## 2020-06-22 NOTE — Patient Instructions (Signed)
Visit Information  Patient Care Plan: General Pharmacy (Adult)    Problem Identified: T1DM, CKD, HF, HTN     Long-Range Goal: Disease Progression Prevention   This Visit's Progress: On track  Priority: High  Note:   Current Barriers:  . Complex patient with multiple comorbidities including DM, HF, CKD  Pharmacist Clinical Goal(s):  Marland Kitchen Over the next 90 days, patient will achieve adherence to monitoring guidelines and medication adherence to achieve therapeutic efficacy through collaboration with PharmD and provider.   Interventions: . 1:1 collaboration with Burnard Hawthorne, FNP regarding development and update of comprehensive plan of care as evidenced by provider attestation and co-signature . Inter-disciplinary care team collaboration (see longitudinal plan of care) . Comprehensive medication review performed; medication list updated in electronic medical record  Diabetes: . Controlled for more stringent goal of ~7.5% or less; current treatment: Lantus 40 units daily, Novolog 5 units with breakfast, 10 units with lunch and supper; Wilder Glade recently added by cardiology for CKD/HF  . Denies hypoglycemic symptoms . Reviewed importance of remaining adequately hydrated w/ SGLT2, given data of SGLT2 for DM in patients with T1DM and increased risk of DKA. Patient verbalized understanding . Reviewed need for eye exam.   CHF, HTN in the setting of CKD: . Controlled BP in clinic today; current treatment: enalapril 40 mg daily, carvedilol 12.5 mg BID, hydralazine 10 mg BID, digoxin 0.125 mg daily, Farxiga 10 mg daily; follows w/ cardiology Dr. Nehemiah Massed, cardiology Dr. Juleen China . Current home readings: reports that she needs to replace the batteries in her home machine . Continue current regimen at this time along with cardiology and nephrology f/u.   CAD, Hyperlipidemia: . Near goal LDL <70; current treatment: simvastatin 20 mg daily. Overdue for lipid panel, but cardiology f/u next  month . Antiplatelet regimen: aspirin 81 mg daily . Consider more stringent LDL goal <70. Consider dose increase of simvastatin to 40 mg daily vs change to higher potency statin  Patient Goals/Self-Care Activities . Over the next 90 days, patient will:  - take medications as prescribed check glucose using CGM, document, and provide at future appointments check blood pressure daily, document, and provide at future appointments  Follow Up Plan: Telephone follow up appointment with care management team member scheduled for: ~ 8 weeks       The patient verbalized understanding of instructions, educational materials, and care plan provided today and declined offer to receive copy of patient instructions, educational materials, and care plan.   Plan: Telephone follow up appointment with care management team member scheduled for:~ 8 weeks  [name], PharmD, [place], [place] Clinical Pharmacist [employer] at [employer] [phone]

## 2020-06-22 NOTE — Assessment & Plan Note (Signed)
A1c 7.5. Continue current regimen including newly added farxiga. She follows with endocrine. Will follow.

## 2020-06-22 NOTE — Progress Notes (Signed)
Subjective:    Patient ID: Chelsea Barton, female    DOB: Oct 07, 1960, 60 y.o.   MRN: [id number]  CC: Chelsea Barton is a 60 y.o. female who presents today for physical exam.    HPI: Feels well today No complaints  DM1- continues to follow with Dr Gabriel Carina, compliant with lantus and novolog. She recently started on farxiga by Dr Nehemiah Massed. No numbness or wounds in her feet. She follows with podiatry as well.   Eye exam is scheduled 07/2020  HTN- compliant with coreg bid,enlapril 40mg  BID, hydralazine 5mg  TID, digoxin 176mcg qd. No cp, sob, leg swelling.   HLD -compliant with zocor 20mg .          Colorectal Cancer Screening: due; declines cologuard and colonoscopy. Never had colonoscopy. No constipation, blood in stool.  Breast Cancer Screening: Mammogram UTD Cervical Cancer Screening: UTD, 2019   Lung Cancer Screening:former smoker 15+ years ago        Tetanus - UTD        Pneumococcal - Complete  Labs: Screening labs today. Exercise: Gets regular exercise walking daily. No cp, sob   Alcohol use:  None Smoking/tobacco use: Nonsmoker.     HISTORY:  Past Medical History:  Diagnosis Date  . Diabetes mellitus   . Hyperlipidemia   . Hypertension     Past Surgical History:  Procedure Laterality Date  . CATARACT EXTRACTION    . eye surgery r Right    lense placed   Family History  Problem Relation Age of Onset  . Diabetes Sister        3/5 sisters have DM  . Breast cancer Neg Hx   . Colon cancer Neg Hx       ALLERGIES: Patient has no known allergies.  Current Outpatient Medications on File Prior to Visit  Medication Sig Dispense Refill  . aspirin 81 MG EC tablet Take 1 tablet (81 mg total) by mouth daily. Swallow whole. 30 tablet 12  . carvedilol (COREG) 12.5 MG tablet Take 1 tablet (12.5 mg total) by mouth 2 (two) times daily with a meal. Take one tablet by mouth daily with a meal. 180 tablet 1  . Continuous Blood Gluc Sensor (DEXCOM G6 SENSOR) MISC Place a  new sensor every 10 days. Use to check blood sugar at least 4 times daily 9 each 3  . Continuous Blood Gluc Transmit (DEXCOM G6 TRANSMITTER) MISC Use to check glucose at least 4 times daily 1 each 3  . digoxin (LANOXIN) 0.125 MG tablet Take 1 tablet (125 mcg total) by mouth daily. 90 tablet 1  . enalapril (VASOTEC) 20 MG tablet Take 2 tablets (40 mg total) by mouth daily. 180 tablet 3  . hydrALAZINE (APRESOLINE) 10 MG tablet TAKE 1/2 TABLET BY MOUTH THREE TIMES A DAY AS NEEDED(TAKE IF BLOOD PRESSURE GREATER THAN 140/90) 30 tablet 1  . insulin glargine (LANTUS) 100 UNIT/ML Solostar Pen Inject 40 Units into the skin daily.     . Insulin Pen Needle (B-D ULTRAFINE III SHORT PEN) 31G X 8 MM MISC USE AS DIRECTED 100 each 6  . Multiple Vitamin (MULTIVITAMIN) capsule Take 1 capsule by mouth daily.    Marland Kitchen NOVOLOG FLEXPEN 100 UNIT/ML FlexPen Inject 25 Units into the skin 3 (three) times daily with meals. Take 5 units at breakfast, 10 units at lunch and 10 units at dinner.    . simvastatin (ZOCOR) 20 MG tablet Take one tablet by mouth every evening. 90 tablet 1  . dapagliflozin  propanediol (FARXIGA) 10 MG TABS tablet Take 10 mg by mouth daily.     No current facility-administered medications on file prior to visit.    Social History   Tobacco Use  . Smoking status: Former Smoker    Quit date: 06/18/2005    Years since quitting: 15.0  . Smokeless tobacco: Never Used  Substance Use Topics  . Alcohol use: No  . Drug use: No    Review of Systems  Constitutional: Negative for chills and fever.  Respiratory: Negative for cough and shortness of breath.   Cardiovascular: Negative for chest pain, palpitations and leg swelling.  Gastrointestinal: Negative for nausea and vomiting.      Objective:    BP 114/72 (BP Location: Left Arm, Patient Position: Sitting, Cuff Size: Normal)   Pulse 65   Temp 98.2 F (36.8 C) (Oral)   Ht 5\' 5"  (1.651 m)   Wt 196 lb (88.9 kg)   SpO2 94%   BMI 32.62 kg/m   BP  Readings from Last 3 Encounters:  06/22/20 114/72  04/18/20 (!) 170/90  01/14/20 (!) 150/82   Wt Readings from Last 3 Encounters:  06/22/20 196 lb (88.9 kg)  04/20/20 203 lb (92.1 kg)  04/18/20 203 lb 9.6 oz (92.4 kg)    Physical Exam Vitals reviewed.  Constitutional:      Appearance: She is well-developed and well-nourished.  Eyes:     Conjunctiva/sclera: Conjunctivae normal.  Cardiovascular:     Rate and Rhythm: Normal rate and regular rhythm.     Pulses: Normal pulses.     Heart sounds: Normal heart sounds.  Pulmonary:     Effort: Pulmonary effort is normal.     Breath sounds: Normal breath sounds. No wheezing, rhonchi or rales.  Skin:    General: Skin is warm and dry.  Neurological:     Mental Status: She is alert.  Psychiatric:        Mood and Affect: Mood and affect normal.        Speech: Speech normal.        Behavior: Behavior normal.        Thought Content: Thought content normal.        Assessment & Plan:   Problem List Items Addressed This Visit      Cardiovascular and Mediastinum   Hypertension    Stable. Continue current regimen as prescribed by cardiology.         Endocrine   Uncontrolled type 1 diabetes mellitus with hyperglycemia, with long-term current use of insulin (HCC)    A1c 7.5. Continue current regimen including newly added farxiga. She follows with endocrine. Will follow.        Other   Hyperlipidemia    Presume controlled. Pending lipid panel. Continue zocor 20mg .       Routine general medical examination at a health care facility - Primary    Pap UTD and she declines pelvic exam in the absence of complaints. Declines CBE today. Emphasized importance of CBE at home. Mammogram UTD. Encouraged screening for colon cancer and patient declines screening whether colonscopy or Cologuard. Encouraged continued exercise.       Relevant Orders   TSH   CBC with Differential/Platelet   Comprehensive metabolic panel   Lipid panel    VITAMIN D 25 Hydroxy (Vit-D Deficiency, Fractures)       I am having Asjia D. Laskaris maintain her multivitamin, NovoLOG FlexPen, insulin glargine, B-D ULTRAFINE III SHORT PEN, Dexcom G6 Sensor, Dexcom G6 Transmitter,  simvastatin, enalapril, digoxin, carvedilol, aspirin, and hydrALAZINE.   No orders of the defined types were placed in this encounter.   Return precautions given.   Risks, benefits, and alternatives of the medications and treatment plan prescribed today were discussed, and patient expressed understanding.   Education regarding symptom management and diagnosis given to patient on AVS.   Continue to follow with Burnard Hawthorne, FNP for routine health maintenance.   Carisma D Morro and I agreed with plan.   Mable Paris, FNP

## 2020-06-26 ENCOUNTER — Other Ambulatory Visit: Payer: Self-pay

## 2020-06-26 DIAGNOSIS — I129 Hypertensive chronic kidney disease with stage 1 through stage 4 chronic kidney disease, or unspecified chronic kidney disease: Secondary | ICD-10-CM | POA: Diagnosis not present

## 2020-06-26 DIAGNOSIS — N2581 Secondary hyperparathyroidism of renal origin: Secondary | ICD-10-CM | POA: Diagnosis not present

## 2020-06-26 DIAGNOSIS — R809 Proteinuria, unspecified: Secondary | ICD-10-CM | POA: Diagnosis not present

## 2020-06-26 DIAGNOSIS — E1122 Type 2 diabetes mellitus with diabetic chronic kidney disease: Secondary | ICD-10-CM | POA: Diagnosis not present

## 2020-06-26 DIAGNOSIS — N1832 Chronic kidney disease, stage 3b: Secondary | ICD-10-CM | POA: Diagnosis not present

## 2020-07-17 ENCOUNTER — Telehealth: Payer: Self-pay | Admitting: *Deleted

## 2020-07-17 NOTE — Telephone Encounter (Signed)
I left a message on her voicemail.  I informed her that her next appointment is scheduled for 07/24/2020 at 8:15 am.

## 2020-07-17 NOTE — Telephone Encounter (Signed)
"  I'm calling to find out when my next appointment is."

## 2020-07-18 ENCOUNTER — Telehealth: Payer: Self-pay | Admitting: Pharmacist

## 2020-07-18 NOTE — Telephone Encounter (Addendum)
  Chronic Care Management   Note  07/18/2020 Name: Chelsea Barton MRN: [id number] DOB: 08/24/1960   Received message from patient asking that I call her, as she has a question. Called patient back, LVM for her to return my call at her convenience.   [name], PharmD, [place], [place] Clinical Pharmacist [employer] at [place]

## 2020-07-24 ENCOUNTER — Encounter: Payer: Self-pay | Admitting: Podiatry

## 2020-07-24 ENCOUNTER — Ambulatory Visit: Payer: Medicaid Other | Admitting: Podiatry

## 2020-07-24 ENCOUNTER — Other Ambulatory Visit: Payer: Self-pay

## 2020-07-24 DIAGNOSIS — M79675 Pain in left toe(s): Secondary | ICD-10-CM

## 2020-07-24 DIAGNOSIS — M79674 Pain in right toe(s): Secondary | ICD-10-CM | POA: Diagnosis not present

## 2020-07-24 DIAGNOSIS — E1042 Type 1 diabetes mellitus with diabetic polyneuropathy: Secondary | ICD-10-CM

## 2020-07-24 DIAGNOSIS — B351 Tinea unguium: Secondary | ICD-10-CM | POA: Diagnosis not present

## 2020-07-24 DIAGNOSIS — N189 Chronic kidney disease, unspecified: Secondary | ICD-10-CM | POA: Diagnosis not present

## 2020-07-24 NOTE — Progress Notes (Signed)
Complaint:  Visit Type: Patient returns to my office for continued preventative foot care services. Complaint: Patient states" my nails have grown long and thick and become painful to walk and wear shoes" Patient has been diagnosed with DM with no foot complications. The patient presents for preventative foot care services. No changes to ROS  Podiatric Exam: Vascular: dorsalis pedis and posterior tibial pulses are barely  palpable bilateral. Capillary return is immediate. Temperature gradient is WNL. Skin turgor WNL  Sensorium: Diminished  Semmes Weinstein monofilament test. Normal tactile sensation bilaterally. Nail Exam: Pt has thick disfigured discolored nails with subungual debris noted bilateral entire nail hallux through fifth toenails Ulcer Exam: There is no evidence of ulcer or pre-ulcerative changes or infection. Orthopedic Exam: Muscle tone and strength are WNL. No limitations in general ROM. No crepitus or effusions noted. HAV  B/L.  Skin: No Porokeratosis. No infection or ulcers.  Callus along the lateral heel right foot.  Asymptomatic.  Diagnosis:  Onychomycosis, , Pain in right toe, pain in left toes  Treatment & Plan Procedures and Treatment: Consent by patient was obtained for treatment procedures. The patient understood the discussion of treatment and procedures well. All questions were answered thoroughly reviewed. Debridement of mycotic and hypertrophic toenails, 1 through 5 bilateral and clearing of subungual debris. No ulceration, no infection noted.  Return Visit-Office Procedure: Patient instructed to return to the office for a follow up visit 6 months for continued evaluation and treatment.  Patient requested six months.    Gardiner Barefoot DPM

## 2020-07-27 DIAGNOSIS — E1029 Type 1 diabetes mellitus with other diabetic kidney complication: Secondary | ICD-10-CM | POA: Diagnosis not present

## 2020-07-27 DIAGNOSIS — R809 Proteinuria, unspecified: Secondary | ICD-10-CM | POA: Diagnosis not present

## 2020-08-03 DIAGNOSIS — E1029 Type 1 diabetes mellitus with other diabetic kidney complication: Secondary | ICD-10-CM | POA: Diagnosis not present

## 2020-08-03 DIAGNOSIS — E103553 Type 1 diabetes mellitus with stable proliferative diabetic retinopathy, bilateral: Secondary | ICD-10-CM | POA: Diagnosis not present

## 2020-08-03 DIAGNOSIS — E1022 Type 1 diabetes mellitus with diabetic chronic kidney disease: Secondary | ICD-10-CM | POA: Diagnosis not present

## 2020-08-03 DIAGNOSIS — R809 Proteinuria, unspecified: Secondary | ICD-10-CM | POA: Diagnosis not present

## 2020-08-03 DIAGNOSIS — E1042 Type 1 diabetes mellitus with diabetic polyneuropathy: Secondary | ICD-10-CM | POA: Diagnosis not present

## 2020-08-15 ENCOUNTER — Other Ambulatory Visit: Payer: Self-pay | Admitting: Nephrology

## 2020-08-15 DIAGNOSIS — N184 Chronic kidney disease, stage 4 (severe): Secondary | ICD-10-CM | POA: Diagnosis not present

## 2020-08-15 DIAGNOSIS — N2581 Secondary hyperparathyroidism of renal origin: Secondary | ICD-10-CM | POA: Diagnosis not present

## 2020-08-15 DIAGNOSIS — E1122 Type 2 diabetes mellitus with diabetic chronic kidney disease: Secondary | ICD-10-CM

## 2020-08-15 DIAGNOSIS — I129 Hypertensive chronic kidney disease with stage 1 through stage 4 chronic kidney disease, or unspecified chronic kidney disease: Secondary | ICD-10-CM

## 2020-08-15 DIAGNOSIS — R809 Proteinuria, unspecified: Secondary | ICD-10-CM | POA: Diagnosis not present

## 2020-08-16 DIAGNOSIS — I25118 Atherosclerotic heart disease of native coronary artery with other forms of angina pectoris: Secondary | ICD-10-CM | POA: Diagnosis not present

## 2020-08-17 ENCOUNTER — Telehealth: Payer: Medicaid Other

## 2020-08-17 ENCOUNTER — Telehealth: Payer: Self-pay | Admitting: Pharmacist

## 2020-08-17 NOTE — Telephone Encounter (Signed)
  Chronic Care Management   Note  08/17/2020 Name: Chelsea Barton MRN: [id number] DOB: Nov 03, 1960   Attempted to contact patient for scheduled appointment for medication management support. Left HIPAA compliant message for patient to return my call at their convenience.    Plan: - If I do not hear back from the patient by end of business today, will collaborate with Care Guide to outreach to schedule follow up with me   [name], PharmD, [place], [place] Pharmacist [employer] at [employer] [phone]

## 2020-08-21 NOTE — Telephone Encounter (Signed)
Patient has been rescheduled.

## 2020-08-30 ENCOUNTER — Other Ambulatory Visit: Payer: Self-pay

## 2020-08-30 ENCOUNTER — Ambulatory Visit: Payer: Medicaid Other | Attending: Nephrology

## 2020-08-31 DIAGNOSIS — E113553 Type 2 diabetes mellitus with stable proliferative diabetic retinopathy, bilateral: Secondary | ICD-10-CM | POA: Diagnosis not present

## 2020-08-31 LAB — HM DIABETES EYE EXAM

## 2020-09-04 DIAGNOSIS — I700 Atherosclerosis of aorta: Secondary | ICD-10-CM | POA: Diagnosis not present

## 2020-09-04 DIAGNOSIS — I119 Hypertensive heart disease without heart failure: Secondary | ICD-10-CM | POA: Insufficient documentation

## 2020-09-04 DIAGNOSIS — I2510 Atherosclerotic heart disease of native coronary artery without angina pectoris: Secondary | ICD-10-CM | POA: Diagnosis not present

## 2020-09-04 DIAGNOSIS — I10 Essential (primary) hypertension: Secondary | ICD-10-CM | POA: Diagnosis not present

## 2020-09-26 ENCOUNTER — Telehealth: Payer: Self-pay | Admitting: Pharmacist

## 2020-09-26 ENCOUNTER — Other Ambulatory Visit: Payer: Self-pay

## 2020-09-26 ENCOUNTER — Ambulatory Visit: Payer: Medicaid Other | Admitting: Pharmacist

## 2020-09-26 ENCOUNTER — Telehealth: Payer: Self-pay | Admitting: Family

## 2020-09-26 DIAGNOSIS — E785 Hyperlipidemia, unspecified: Secondary | ICD-10-CM

## 2020-09-26 DIAGNOSIS — I10 Essential (primary) hypertension: Secondary | ICD-10-CM

## 2020-09-26 DIAGNOSIS — N189 Chronic kidney disease, unspecified: Secondary | ICD-10-CM

## 2020-09-26 DIAGNOSIS — I2510 Atherosclerotic heart disease of native coronary artery without angina pectoris: Secondary | ICD-10-CM

## 2020-09-26 DIAGNOSIS — E1039 Type 1 diabetes mellitus with other diabetic ophthalmic complication: Secondary | ICD-10-CM

## 2020-09-26 MED ORDER — SIMVASTATIN 20 MG PO TABS: ORAL_TABLET | ORAL | 1 refills | Status: DC

## 2020-09-26 NOTE — Telephone Encounter (Signed)
Patient requests refill on simvastatin.   She said they dispensed a 10 day supply to her. Unsure if this was an emergency supply because they need a refill script? I don't see a where a refill request has come.

## 2020-09-26 NOTE — Patient Instructions (Addendum)
Visit Information  Goals Addressed              This Visit's Progress     Patient Stated   .  Medication Monitoring (pt-stated)        Patient Goals/Self-Care Activities . Over the next 90 days, patient will:  - take medications as prescribed check glucose using CGM, document, and provide at future appointments check blood pressure daily, document, and provide at future appointments       Patient verbalizes understanding of instructions provided today and agrees to view in [place].    Plan: Transitioning Care Coordination to Managed Medicaid team  [name], PharmD, [place], [place] Clinical Pharmacist [employer] at [place]

## 2020-09-26 NOTE — Telephone Encounter (Signed)
I attempted to reach [facility] Chelsea Barton today to get her scheduled for a phone visit with the Managed Medicaid team. I was not able to leave a message. I will reach out again tomorrow.

## 2020-09-26 NOTE — Chronic Care Management (AMB) (Addendum)
Care Management   Pharmacy Note  09/26/2020 Name: Chelsea Barton MRN: [id number] DOB: 01-02-1961  Subjective: Chelsea Barton is a 60 y.o. year old female who is a primary care patient of Burnard Hawthorne, FNP. The Care Management team was consulted for assistance with care management and care coordination needs.    Engaged with patient by telephone for follow up visit in response to provider referral for pharmacy case management and/or care coordination services.   The patient was given information about Care Management services today including:  1. Care Management services includes personalized support from designated clinical staff supervised by the patient's primary care provider, including individualized plan of care and coordination with other care providers. 2. 24/7 contact phone numbers for assistance for urgent and routine care needs. 3. The patient may stop case management services at any time by phone call to the office staff.  Patient agreed to services and consent obtained.  Recent Consult Visit:  3/22- Dr. Nehemiah Massed cardiology, no change   3/2 - ECHO, LVEF 45-50%, moderate vlvular regurgitation  3/1 - Dr. Juleen China nephrology, no change   2/17 - Dr. Gabriel Carina endocrinology, A1c 7.7%; reminded Novolog is premeal.   2/7 - Podiatry for foot care  1/10 - Dr. Juleen China nephrology- nifedipine started for HTN  Assessment:  Review of patient status, including review of consultants reports, laboratory and other test data, was performed as part of comprehensive evaluation and provision of chronic care management services.   SDOH (Social Determinants of Health) assessments and interventions performed:  SDOH Interventions   Flowsheet Row Most Recent Value  SDOH Interventions   SDOH Interventions for the Following Domains Financial Strain  Financial Strain Interventions Other (Comment)  [managed medicaid referral for benefits support]       Objective:  Lab Results  Component  Value Date   CREATININE 2.56 (H) 06/22/2020   CREATININE 1.27 (H) 11/14/2017   CREATININE 1.49 (H) 10/29/2017    Lab Results  Component Value Date   HGBA1C 7.3 (A) 04/18/2020       Component Value Date/Time   CHOL 133 06/22/2020 0904   TRIG 135.0 06/22/2020 0904   HDL 41.60 06/22/2020 0904   CHOLHDL 3 06/22/2020 0904   VLDL 27.0 06/22/2020 0904   LDLCALC 64 06/22/2020 0904   LDLDIRECT 84.9 09/10/2012 1628    Clinical ASCVD: Yes  The 10-year ASCVD risk score Mikey Bussing DC Jr., et al., 2013) is: 13.8%   Values used to calculate the score:     Age: 29 years     Sex: Female     Is Non-Hispanic African American: Yes     Diabetic: Yes     Tobacco smoker: No     Systolic Blood Pressure: [id number]     Is BP treated: Yes     HDL Cholesterol: 41.6 mg/dL     Total Cholesterol: 133 mg/dL     BP Readings from Last 3 Encounters:  06/22/20 114/72  04/18/20 (!) 170/90  01/14/20 (!) 150/82    Care Plan  No Known Allergies  Medications Reviewed Today    Reviewed by De Hollingshead, RPH-CPP (Pharmacist) on 09/26/20 at 7  Med List Status: <None>  Medication Order Taking? Sig Documenting Provider Last Dose Status Informant  aspirin 81 MG EC tablet [id number] Yes Take 1 tablet (81 mg total) by mouth daily. Swallow whole. Burnard Hawthorne, FNP Taking Active   carvedilol (COREG) 12.5 MG tablet [id number] Yes Take 1 tablet (12.5 mg total) by mouth  2 (two) times daily with a meal. Take one tablet by mouth daily with a meal. Burnard Hawthorne, FNP Taking Active   Continuous Blood Gluc Sensor ([place]) MISC [id number]  Place a new sensor every 10 days. Use to check blood sugar at least 4 times daily Burnard Hawthorne, FNP  Active   Continuous Blood Gluc Transmit (DEXCOM G6 TRANSMITTER) MISC [id number]  Use to check glucose at least 4 times daily Burnard Hawthorne, FNP  Active   dapagliflozin propanediol (FARXIGA) 10 MG TABS tablet [id number] Yes Take 10 mg by mouth daily. [provider] Taking Active   digoxin (LANOXIN) 0.125 MG tablet [id number] Yes Take 1 tablet (125 mcg total) by mouth daily. Burnard Hawthorne, FNP Taking Active   enalapril (VASOTEC) 20 MG tablet [id number] Yes Take 2 tablets (40 mg total) by mouth daily. Burnard Hawthorne, FNP Taking Active   hydrALAZINE (APRESOLINE) 10 MG tablet [id number] Yes TAKE 1/2 TABLET BY MOUTH THREE TIMES A DAY AS NEEDED(TAKE IF BLOOD PRESSURE GREATER THAN 140/90) Burnard Hawthorne, FNP Taking Active            Med Note ([place]   Tue Sep 26, 2020 10:30 AM)    insulin glargine (LANTUS) 100 UNIT/ML Solostar Pen [id number] Yes Inject 40 Units into the skin daily.  [provider] Taking Active   Insulin Pen Needle (B-D ULTRAFINE III SHORT PEN) 31G X 8 MM MISC [id number] Yes USE AS DIRECTED Burnard Hawthorne, FNP Taking Active   Multiple Vitamin (MULTIVITAMIN) capsule [id number] Yes Take 1 capsule by mouth daily. [provider] Taking Active   NIFEdipine (PROCARDIA-XL/NIFEDICAL-XL) 30 MG 24 hr tablet [id number] Yes Take 30 mg by mouth daily. [provider] Taking Active   NOVOLOG FLEXPEN 100 UNIT/ML FlexPen [id number] Yes Inject 25 Units into the skin 3 (three) times daily with meals. Take 5 units at breakfast, 10 units at lunch and 10 units at dinner. [provider] Taking Active   simvastatin (ZOCOR) 20 MG tablet [id number] Yes Take one tablet by mouth every evening. Burnard Hawthorne, FNP Taking Active           Patient Active Problem List   Diagnosis Date Noted  . Aortic atherosclerosis ([place]) 06/22/2020  . Benign hypertensive kidney disease with chronic kidney disease 08/17/2019  . Proteinuria 08/17/2019  . Secondary hyperparathyroidism of renal origin ([place]) 08/17/2019  . Proliferative diabetic retinopathy associated with type 1 diabetes mellitus ([place]) 03/25/2019  . Esophageal thickening 03/18/2019  . Pain due to onychomycosis of toenails of both feet 12/14/2018  .  Diabetic neuropathy ([place]) 12/14/2018  . Long-term insulin use ([place]) 08/03/2018  . CKD (chronic kidney disease) 11/20/2016  . CAD (coronary artery disease) 11/20/2016  . Mitral valve disease 11/20/2016  . Impaired vision 11/20/2016  . Moderate mitral insufficiency 03/06/2015  . DM type 1 with diabetic peripheral neuropathy ([place]) 03/01/2015  . Uncontrolled type 1 diabetes mellitus with hyperglycemia, with long-term current use of insulin ([place]) 03/01/2015  . Benign essential hypertension 10/03/2014  . Routine general medical examination at a health care facility 12/14/2012  . Obesity 12/14/2012  . Hypertension 04/01/2011  . Hyperlipidemia 04/01/2011  . DM type 1 (diabetes mellitus, type 1) ([place]) 04/01/2011    Conditions to be addressed/monitored: CHF, HTN, HLD and T1DM  Care Plan : General Pharmacy (Adult)  Updates made by De Hollingshead, RPH-CPP since 09/26/2020 12:00 AM  Completed 09/26/2020  Problem: T1DM, CKD, HF, HTN Resolved 09/26/2020  Long-Range Goal: Disease Progression Prevention Completed 09/26/2020  This Visit's Progress: On track  Recent Progress: On track  Priority: High  Note:   Current Barriers:  . Complex patient with multiple comorbidities including DM, HF, CKD  Pharmacist Clinical Goal(s):  Marland Kitchen Over the next 90 days, patient will achieve adherence to monitoring guidelines and medication adherence to achieve therapeutic efficacy through collaboration with PharmD and provider.   Interventions: . 1:1 collaboration with Burnard Hawthorne, FNP regarding development and update of comprehensive plan of care as evidenced by provider attestation and co-signature . Inter-disciplinary care team collaboration (see longitudinal plan of care) . Comprehensive medication review performed; medication list updated in electronic medical record  Diabetes: . Uncontrolled; current treatment: Lantus 40 units QPM, Novolog 5 units with breakfast, 10 units with lunch and supper;  Farxiga 10 mg; follows w/ Dr. Gabriel Carina endocrinology . Current glucose readings: reports fastings are at goal, but post supper elevations, even when she appropriately takes Novolog prior to supper and even when she doesn't eat snacks after supper . Current meal patterns: breakfast: 2 boiled eggs and coffee, lunch: ginger snap cookies sometimes, sometimes a salad, water; supper: water, lettuce and tomato sandwich; tonight having steak w/ salad, sometimes will eat half a potato; had fried chicken on Sunday with family; after supper snack: sometimes fruits . Recommend to continue current regimen along with endocrinology collaboration. Reminded of appropriate Novolog dosing.   CHF (last LVEF 45-50%), HTN in the setting of CKD: . Controlled; current treatment: enalapril 40 mg daily, carvedilol 12.5 mg BID, nifedipine XL 30 mg, hydralazine 5-10 mg PRN, digoxin 0.125 mg daily, Farxiga 10 mg daily; follows w/ cardiology Dr. Nehemiah Massed, cardiology Dr. Juleen China . Reports she has not needed hydralazine since nifedipine was added . Home weights: does not have a scale at home  SBP DBP   '130 75   109 70   101 60   131 70   121 74   127 72   131 72   96 64   107 69   121 76   127 72   112 66   126 '$ 72   124 75   128 78   105 69   126 68   115 63  AVG 119 70   . Current home readings: reports that she needs to replace the batteries in her home machine . Does not add salt at the table. Praised for this.  . Educated on need to weigh daily. Patient does not have a scale. Will collaborate with Managed Medicaid team for benefit support and disease management support. . Continue current regimen at this time along with cardiology and nephrology f/u.   CAD, Hyperlipidemia: . Near goal LDL <70; current treatment: simvastatin 20 mg daily . Antiplatelet regimen: aspirin 81 mg daily . Notes that the pharmacy filled a 10 day supply of simvastatin. May be that the pharmacy needs a refill and this 10 day supply was  an emergency supply. Will collaborate w/ PCP to send refill on simvastatin 20 mg  . Consider more stringent LDL goal <70. Consider dose increase of simvastatin to 40 mg daily vs change to higher potency statin  Patient Goals/Self-Care Activities . Over the next 90 days, patient will:  - take medications as prescribed check glucose using CGM, document, and provide at future appointments check blood pressure daily, document, and provide at future appointments  Follow Up Plan: Transitioning care coordination support to Managed Medicaid team  Medication Assistance:  None required.  Patient affirms current coverage meets needs.  Follow Up:  Patient agrees to Care Plan and Follow-up.  Plan: Transitioning Care Coordination to Managed Medicaid team  [name], PharmD, [place], CPP Clinical Pharmacist [facility] at [place]  Encounter details: CCM Time Spent      Value Time User   Total time (minutes)  0 09/26/2020 10:33 AM De Hollingshead, RPH-CPP     Moderate to High Complex Decision Making    None     CCM Services: This encounter meets routine CCM services.  Prior to outreach and patient consent for Chronic Care Management, I referred this patient for services after reviewing the nominated patient list or from a personal encounter with the patient.  I have personally reviewed this encounter including the documentation in this note and have collaborated with the care management provider regarding care management and care coordination activities to include development and update of the comprehensive care plan. I am certifying that I agree with the content of this note and encounter as supervising physician. Mable Paris, NP

## 2020-09-26 NOTE — Telephone Encounter (Signed)
I spoke with patient to let her know that simvastatin was sent to [facility] for her to pick up.

## 2020-09-27 ENCOUNTER — Telehealth: Payer: Self-pay | Admitting: Family

## 2020-09-27 NOTE — Telephone Encounter (Signed)
2nd attempt to reach Chelsea Barton to get her scheduled with the Managed Medicaid team. Unable to leave a message.

## 2020-10-17 ENCOUNTER — Other Ambulatory Visit: Payer: Self-pay | Admitting: Family

## 2020-11-01 ENCOUNTER — Encounter: Payer: Self-pay | Admitting: Family

## 2020-11-01 ENCOUNTER — Ambulatory Visit (INDEPENDENT_AMBULATORY_CARE_PROVIDER_SITE_OTHER): Payer: Medicaid Other | Admitting: Family

## 2020-11-01 ENCOUNTER — Other Ambulatory Visit: Payer: Self-pay

## 2020-11-01 DIAGNOSIS — I10 Essential (primary) hypertension: Secondary | ICD-10-CM | POA: Diagnosis not present

## 2020-11-01 DIAGNOSIS — I2510 Atherosclerotic heart disease of native coronary artery without angina pectoris: Secondary | ICD-10-CM | POA: Diagnosis not present

## 2020-11-01 DIAGNOSIS — E785 Hyperlipidemia, unspecified: Secondary | ICD-10-CM | POA: Diagnosis not present

## 2020-11-01 DIAGNOSIS — I700 Atherosclerosis of aorta: Secondary | ICD-10-CM

## 2020-11-01 NOTE — Assessment & Plan Note (Signed)
Stable , controlled. LDL 72. Continue simvastatin '20mg'$ 

## 2020-11-01 NOTE — Progress Notes (Signed)
Subjective:    Patient ID: Chelsea Barton, female    DOB: 1961-05-10, 60 y.o.   MRN: [id number]  CC: Chelsea Barton is a 60 y.o. female who presents today for follow up.   HPI: Feels well today No new complaints  HTN- compliant with coreg 12.'5mg'$  BID, enlapril '40mg'$ , hydralazine '5mg'$  tid, nifedipine '30mg'$  QD  HLD- compliant with simvastatin '20mg'$   Follows with Dr Nehemiah Massed for CAD, LVF. No changes during visit 08/2020 Follows with  Nephrology, Dr Juleen China Follows with Dr Gabriel Carina for DM I    Crt 2.3 three months ago  Mammogram and colonoscopy utd  HISTORY:  Past Medical History:  Diagnosis Date  . Diabetes mellitus   . Hyperlipidemia   . Hypertension    Past Surgical History:  Procedure Laterality Date  . CATARACT EXTRACTION    . eye surgery r Right    lense placed   Family History  Problem Relation Age of Onset  . Diabetes Sister        3/5 sisters have DM  . Breast cancer Neg Hx   . Colon cancer Neg Hx     Allergies: Patient has no known allergies. Current Outpatient Medications on File Prior to Visit  Medication Sig Dispense Refill  . aspirin 81 MG EC tablet Take 1 tablet (81 mg total) by mouth daily. Swallow whole. 30 tablet 12  . carvedilol (COREG) 12.5 MG tablet Take 1 tablet (12.5 mg total) by mouth 2 (two) times daily with a meal. Take one tablet by mouth daily with a meal. 180 tablet 1  . Continuous Blood Gluc Sensor (DEXCOM G6 SENSOR) MISC Place a new sensor every 10 days. Use to check blood sugar at least 4 times daily 9 each 3  . Continuous Blood Gluc Transmit (DEXCOM G6 TRANSMITTER) MISC Use to check glucose at least 4 times daily 1 each 3  . dapagliflozin propanediol (FARXIGA) 10 MG TABS tablet Take 10 mg by mouth daily.    . digoxin (LANOXIN) 0.125 MG tablet Take 1 tablet (125 mcg total) by mouth daily. 90 tablet 1  . enalapril (VASOTEC) 20 MG tablet Take 2 tablets (40 mg total) by mouth daily. 180 tablet 3  . hydrALAZINE (APRESOLINE) 10 MG tablet TAKE 1/2  TABLET BY MOUTH THREE TIMES DAILY AS NEEDED(TAKE IF BLOOD PRESSURE IS GREATER THAN 140/90) 30 tablet 1  . insulin glargine (LANTUS) 100 UNIT/ML Solostar Pen Inject 40 Units into the skin daily.     . Insulin Pen Needle (B-D ULTRAFINE III SHORT PEN) 31G X 8 MM MISC USE AS DIRECTED 100 each 6  . Multiple Vitamin (MULTIVITAMIN) capsule Take 1 capsule by mouth daily.    Marland Kitchen NIFEdipine (PROCARDIA-XL/NIFEDICAL-XL) 30 MG 24 hr tablet Take 30 mg by mouth daily.    Marland Kitchen NOVOLOG FLEXPEN 100 UNIT/ML FlexPen Inject 25 Units into the skin 3 (three) times daily with meals. Take 5 units at breakfast, 10 units at lunch and 10 units at dinner.    . simvastatin (ZOCOR) 20 MG tablet Take one tablet by mouth every evening. 90 tablet 1  . Vitamin D, Cholecalciferol, 10 MCG (400 UNIT) CAPS Take 1 capsule by mouth daily.     No current facility-administered medications on file prior to visit.    Social History   Tobacco Use  . Smoking status: Former Smoker    Quit date: 06/18/2005    Years since quitting: 15.3  . Smokeless tobacco: Never Used  Substance Use Topics  . Alcohol use:  No  . Drug use: No    Review of Systems  Constitutional: Negative for chills and fever.  Respiratory: Negative for cough.   Cardiovascular: Negative for chest pain and palpitations.  Gastrointestinal: Negative for nausea and vomiting.      Objective:    BP 120/68 (BP Location: Left Arm, Patient Position: Sitting, Cuff Size: Large)   Pulse 62   Temp 97.6 F (36.4 C) (Oral)   Ht '5\' 5"'$  (1.651 m)   Wt 192 lb 6.4 oz (87.3 kg)   SpO2 98%   BMI 32.02 kg/m  BP Readings from Last 3 Encounters:  11/01/20 120/68  06/22/20 114/72  04/18/20 (!) 170/90   Wt Readings from Last 3 Encounters:  11/01/20 192 lb 6.4 oz (87.3 kg)  06/22/20 196 lb (88.9 kg)  04/20/20 203 lb (92.1 kg)    Physical Exam Vitals reviewed.  Constitutional:      Appearance: She is well-developed.  Eyes:     Conjunctiva/sclera: Conjunctivae normal.   Cardiovascular:     Rate and Rhythm: Normal rate and regular rhythm.     Pulses: Normal pulses.     Heart sounds: Normal heart sounds.  Pulmonary:     Effort: Pulmonary effort is normal.     Breath sounds: Normal breath sounds. No wheezing, rhonchi or rales.  Skin:    General: Skin is warm and dry.  Neurological:     Mental Status: She is alert.  Psychiatric:        Speech: Speech normal.        Behavior: Behavior normal.        Thought Content: Thought content normal.        Assessment & Plan:   Problem List Items Addressed This Visit      Cardiovascular and Mediastinum   Aortic atherosclerosis (HCC)    Stable , chronic. Follows with Dr Nehemiah Massed , cardiology.  Continue simvastatin '20mg'$  .      CAD (coronary artery disease)    Chronic, stable. Continue simvastatin '20mg'$       Hypertension    Controlled. Continue coreg 12.'5mg'$  BID, enlapril '40mg'$ , hydralazine '5mg'$  tid, nifedipine '30mg'$  QD         Other   Hyperlipidemia    Stable , controlled. LDL 72. Continue simvastatin '20mg'$            I am having Chelsea Barton maintain her multivitamin, NovoLOG FlexPen, insulin glargine, B-D ULTRAFINE III SHORT PEN, Dexcom G6 Sensor, Dexcom G6 Transmitter, enalapril, digoxin, carvedilol, aspirin, dapagliflozin propanediol, NIFEdipine, Vitamin D (Cholecalciferol), simvastatin, and hydrALAZINE.   No orders of the defined types were placed in this encounter.   Return precautions given.   Risks, benefits, and alternatives of the medications and treatment plan prescribed today were discussed, and patient expressed understanding.   Education regarding symptom management and diagnosis given to patient on AVS.  Continue to follow with Burnard Hawthorne, FNP for routine health maintenance.   Chelsea Barton and I agreed with plan.   Mable Paris, FNP

## 2020-11-01 NOTE — Assessment & Plan Note (Signed)
Stable , chronic. Follows with Dr Nehemiah Massed , cardiology.  Continue simvastatin '20mg'$  .

## 2020-11-03 NOTE — Assessment & Plan Note (Signed)
Controlled. Continue coreg 12.'5mg'$  BID, enlapril '40mg'$ , hydralazine '5mg'$  tid, nifedipine '30mg'$  QD

## 2020-11-03 NOTE — Assessment & Plan Note (Signed)
Chronic, stable.  Continue simvastatin 20 mg 

## 2020-11-10 DIAGNOSIS — E1022 Type 1 diabetes mellitus with diabetic chronic kidney disease: Secondary | ICD-10-CM | POA: Diagnosis not present

## 2020-11-10 DIAGNOSIS — N184 Chronic kidney disease, stage 4 (severe): Secondary | ICD-10-CM | POA: Diagnosis not present

## 2020-11-10 DIAGNOSIS — E1042 Type 1 diabetes mellitus with diabetic polyneuropathy: Secondary | ICD-10-CM | POA: Diagnosis not present

## 2020-11-10 DIAGNOSIS — E103553 Type 1 diabetes mellitus with stable proliferative diabetic retinopathy, bilateral: Secondary | ICD-10-CM | POA: Diagnosis not present

## 2020-11-10 DIAGNOSIS — E1029 Type 1 diabetes mellitus with other diabetic kidney complication: Secondary | ICD-10-CM | POA: Diagnosis not present

## 2020-11-29 DIAGNOSIS — I129 Hypertensive chronic kidney disease with stage 1 through stage 4 chronic kidney disease, or unspecified chronic kidney disease: Secondary | ICD-10-CM | POA: Diagnosis not present

## 2020-11-29 DIAGNOSIS — R809 Proteinuria, unspecified: Secondary | ICD-10-CM | POA: Diagnosis not present

## 2020-11-29 DIAGNOSIS — N2581 Secondary hyperparathyroidism of renal origin: Secondary | ICD-10-CM | POA: Diagnosis not present

## 2020-11-29 DIAGNOSIS — E1122 Type 2 diabetes mellitus with diabetic chronic kidney disease: Secondary | ICD-10-CM | POA: Diagnosis not present

## 2020-11-29 DIAGNOSIS — N1832 Chronic kidney disease, stage 3b: Secondary | ICD-10-CM | POA: Diagnosis not present

## 2020-12-04 ENCOUNTER — Telehealth: Payer: Self-pay | Admitting: Pharmacist

## 2020-12-04 NOTE — Telephone Encounter (Signed)
  Chronic Care Management   Note  12/04/2020 Name: TAIDE TAFUR MRN: [id number] DOB: 1960-08-13   Patient called me asking how to go about scheduling a COVID booster. Provided the [facility] vaccine scheduling phone number [phone] (Mon-Fri 7 a.m. to 7 p.m). She verbalized understanding.   [name], PharmD, [place], [place] Clinical Pharmacist [employer] at [place]

## 2020-12-05 ENCOUNTER — Other Ambulatory Visit: Payer: Self-pay

## 2020-12-05 ENCOUNTER — Ambulatory Visit: Payer: Medicaid Other | Attending: Internal Medicine

## 2020-12-05 DIAGNOSIS — Z23 Encounter for immunization: Secondary | ICD-10-CM

## 2020-12-05 MED ORDER — MODERNA COVID-19 VACCINE 100 MCG/0.5ML IM SUSP
INTRAMUSCULAR | 0 refills | Status: DC
  Filled 2020-12-05: qty 0.25, 1d supply, fill #0

## 2020-12-05 NOTE — Progress Notes (Signed)
   Covid-19 Vaccination Clinic  Name:  Chelsea Barton    MRN: [id number] DOB: [birth year]  12/05/2020  [name] was observed post Covid-19 immunization for 15 minutes without incident. She was provided with Vaccine Information Sheet and instruction to access the V-Safe system.   [name] was instructed to call 911 with any severe reactions post vaccine: Difficulty breathing  Swelling of face and throat  A fast heartbeat  A bad rash all over body  Dizziness and weakness   Immunizations Administered     Name Date Dose VIS Date Route   Moderna Covid-19 Booster Vaccine 12/05/2020 10:42 AM 0.25 mL 04/05/2020 Intramuscular   Manufacturer: Moderna   Lot: [id number]   [id number]

## 2020-12-19 ENCOUNTER — Telehealth: Payer: Medicaid Other

## 2021-01-10 ENCOUNTER — Other Ambulatory Visit: Payer: Self-pay | Admitting: Family

## 2021-01-10 DIAGNOSIS — E785 Hyperlipidemia, unspecified: Secondary | ICD-10-CM

## 2021-01-18 ENCOUNTER — Ambulatory Visit: Payer: Medicaid Other | Admitting: Podiatry

## 2021-01-18 ENCOUNTER — Other Ambulatory Visit: Payer: Self-pay

## 2021-01-18 ENCOUNTER — Encounter: Payer: Self-pay | Admitting: Podiatry

## 2021-01-18 DIAGNOSIS — B351 Tinea unguium: Secondary | ICD-10-CM | POA: Diagnosis not present

## 2021-01-18 DIAGNOSIS — E1042 Type 1 diabetes mellitus with diabetic polyneuropathy: Secondary | ICD-10-CM | POA: Diagnosis not present

## 2021-01-18 DIAGNOSIS — N189 Chronic kidney disease, unspecified: Secondary | ICD-10-CM | POA: Diagnosis not present

## 2021-01-18 DIAGNOSIS — M79675 Pain in left toe(s): Secondary | ICD-10-CM | POA: Diagnosis not present

## 2021-01-18 DIAGNOSIS — M79674 Pain in right toe(s): Secondary | ICD-10-CM

## 2021-01-18 NOTE — Progress Notes (Signed)
Complaint:  Visit Type: Patient returns to my office for continued preventative foot care services. Complaint: Patient states" my nails have grown long and thick and become painful to walk and wear shoes" Patient has been diagnosed with DM with no foot complications. The patient presents for preventative foot care services. No changes to ROS  Podiatric Exam: Vascular: dorsalis pedis and posterior tibial pulses are barely  palpable bilateral. Capillary return is immediate. Temperature gradient is WNL. Skin turgor WNL  Sensorium: Diminished  Semmes Weinstein monofilament test. Normal tactile sensation bilaterally. Nail Exam: Pt has thick disfigured discolored nails with subungual debris noted bilateral entire nail hallux through fifth toenails Ulcer Exam: There is no evidence of ulcer or pre-ulcerative changes or infection. Orthopedic Exam: Muscle tone and strength are WNL. No limitations in general ROM. No crepitus or effusions noted. HAV  B/L.  Skin: No Porokeratosis. No infection or ulcers.  Callus along the lateral heel right foot.  Asymptomatic.  Diagnosis:  Onychomycosis, , Pain in right toe, pain in left toes  Treatment & Plan Procedures and Treatment: Consent by patient was obtained for treatment procedures. The patient understood the discussion of treatment and procedures well. All questions were answered thoroughly reviewed. Debridement of mycotic and hypertrophic toenails, 1 through 5 bilateral and clearing of subungual debris. No ulceration, no infection noted.  Debrided callus right heel. Return Visit-Office Procedure: Patient instructed to return to the office for a follow up visit 6 months for continued evaluation and treatment.  Patient requested six months.    Gardiner Barefoot DPM

## 2021-01-22 ENCOUNTER — Ambulatory Visit: Payer: Medicaid Other | Admitting: Podiatry

## 2021-02-06 LAB — HEMOGLOBIN A1C: Hemoglobin A1C: 8

## 2021-02-12 ENCOUNTER — Ambulatory Visit: Payer: Self-pay | Admitting: Pharmacist

## 2021-02-12 NOTE — Chronic Care Management (AMB) (Signed)
  Chronic Care Management   Note  02/12/2021 Name: Chelsea Barton MRN: [id number] DOB: [birth year]  Received call from patient noting that her insurance wasn't working to cover her medications. Called her. She noted that her DexCom sensor + transmitter ends tomorrow and the pharmacy is charging a high amount.   She notes that she signed up for a new Managed Medicaid plan with [facility] [employer]. In the course of reviewing this during our phone call, she remembered that the new plan doesn't take effect until 02/15/21.   She has test strips, lancets, and a glucometer to allow for blood sugar monitoring until Thursday when the plan goes into effect. She has ample supplies of all medications.   [name], PharmD, [place], [place] Clinical Pharmacist [employer] at [place]

## 2021-02-15 ENCOUNTER — Ambulatory Visit: Payer: Medicaid Other | Admitting: Pharmacist

## 2021-02-15 DIAGNOSIS — I2510 Atherosclerotic heart disease of native coronary artery without angina pectoris: Secondary | ICD-10-CM

## 2021-02-15 DIAGNOSIS — I10 Essential (primary) hypertension: Secondary | ICD-10-CM

## 2021-02-15 DIAGNOSIS — N189 Chronic kidney disease, unspecified: Secondary | ICD-10-CM

## 2021-02-15 DIAGNOSIS — E1039 Type 1 diabetes mellitus with other diabetic ophthalmic complication: Secondary | ICD-10-CM

## 2021-02-15 DIAGNOSIS — E785 Hyperlipidemia, unspecified: Secondary | ICD-10-CM

## 2021-02-15 NOTE — Patient Instructions (Signed)
Visit Information   Goals Addressed               This Visit's Progress     Patient Stated     Medication Monitoring (pt-stated)        Patient Goals/Self-Care Activities Over the next 90 days, patient will:  - take medications as prescribed check glucose using CGM, document, and provide at future appointments check blood pressure daily, document, and provide at future appointments        Patient verbalizes understanding of instructions provided today and agrees to view in [place].   Plan: Telephone follow up appointment with care management team member scheduled for:  ~12 weeks  [name], PharmD, [place], [place] Clinical Pharmacist [employer] at [employer] [phone]

## 2021-02-15 NOTE — Chronic Care Management (AMB) (Signed)
Care Management   Pharmacy Note  02/15/2021 Name: Chelsea Barton MRN: [id number] DOB: 1961/03/02  Subjective: Chelsea Barton is a 60 y.o. year old female who is a primary care patient of Chelsea Hawthorne, FNP. The Care Management team was consulted for assistance with care management and care coordination needs.    Engaged with patient by telephone for  medication access question  in response to provider referral for pharmacy case management and/or care coordination services.   The patient was given information about Care Management services today including:  Care Management services includes personalized support from designated clinical staff supervised by the patient's primary care provider, including individualized plan of care and coordination with other care providers. 24/7 contact phone numbers for assistance for urgent and routine care needs. The patient may stop case management services at any time by phone call to the office staff.  Patient agreed to services and consent obtained.  Assessment:  Review of patient status, including review of consultants reports, laboratory and other test data, was performed as part of comprehensive evaluation and provision of chronic care management services.   SDOH (Social Determinants of Health) assessments and interventions performed:  SDOH Interventions    Flowsheet Row Most Recent Value  SDOH Interventions   Financial Strain Interventions Intervention Not Indicated        Objective:  Lab Results  Component Value Date   CREATININE 2.56 (H) 06/22/2020   CREATININE 1.27 (H) 11/14/2017   CREATININE 1.49 (H) 10/29/2017    Lab Results  Component Value Date   HGBA1C 7.3 (A) 04/18/2020       Component Value Date/Time   CHOL 133 06/22/2020 0904   TRIG 135.0 06/22/2020 0904   HDL 41.60 06/22/2020 0904   CHOLHDL 3 06/22/2020 0904   VLDL 27.0 06/22/2020 0904   LDLCALC 64 06/22/2020 0904   LDLDIRECT 84.9 09/10/2012 1628      Clinical ASCVD: No  The 10-year ASCVD risk score Mikey Bussing DC Jr., et al., 2013) is: 22.8%   Values used to calculate the score:     Age: 89 years     Sex: Female     Is Non-Hispanic African American: Yes     Diabetic: Yes     Tobacco smoker: No     Systolic Blood Pressure: 0000000 mmHg     Is BP treated: Yes     HDL Cholesterol: 41.6 mg/dL     Total Cholesterol: 133 mg/dL    BP Readings from Last 3 Encounters:  11/01/20 120/68  06/22/20 114/72  04/18/20 (!) 170/90    Care Plan  No Known Allergies  Medications Reviewed Today     Reviewed by Gardiner Barefoot, DPM (Physician) on 01/18/21 at 1014  Med List Status: <None>   Medication Order Taking? Sig Documenting Provider Last Dose Status Informant  aspirin 81 MG EC tablet [id number] No Take 1 tablet (81 mg total) by mouth daily. Swallow whole. Chelsea Hawthorne, FNP Taking Active   carvedilol (COREG) 12.5 MG tablet [id number] No Take 1 tablet (12.5 mg total) by mouth 2 (two) times daily with a meal. Take one tablet by mouth daily with a meal. Chelsea Hawthorne, FNP Taking Active   Continuous Blood Gluc Sensor (DEXCOM G6 SENSOR) MISC [id number] No Place a new sensor every 10 days. Use to check blood sugar at least 4 times daily Chelsea Hawthorne, FNP Taking Active   Continuous Blood Gluc Transmit (DEXCOM G6 TRANSMITTER) MISC [id number] No Use to check glucose at  least 4 times daily Chelsea Hawthorne, FNP Taking Active   COVID-19 mRNA vaccine, Moderna, (MODERNA COVID-19 VACCINE) 100 MCG/0.5ML injection [id number]  Inject into the muscle. Carlyle Basques, MD  Active   dapagliflozin propanediol (FARXIGA) 10 MG TABS tablet [id number] No Take 10 mg by mouth daily. [provider] Taking Active   digoxin (LANOXIN) 0.125 MG tablet [id number] No Take 1 tablet (125 mcg total) by mouth daily. Chelsea Hawthorne, FNP Taking Active   enalapril (VASOTEC) 20 MG tablet [id number] No Take 2 tablets (40 mg total) by mouth daily. Chelsea Hawthorne,  FNP Taking Active   hydrALAZINE (APRESOLINE) 10 MG tablet [id number] No TAKE 1/2 TABLET BY MOUTH THREE TIMES DAILY AS NEEDED(TAKE IF BLOOD PRESSURE IS GREATER THAN 140/90) Chelsea Hawthorne, FNP Taking Active   insulin glargine (LANTUS) 100 UNIT/ML Solostar Pen [id number] No Inject 40 Units into the skin daily.  [provider] Taking Active   Insulin Pen Needle (B-D ULTRAFINE III SHORT PEN) 31G X 8 MM MISC [id number] No USE AS DIRECTED Chelsea Hawthorne, FNP Taking Active   Multiple Vitamin (MULTIVITAMIN) capsule [id number] No Take 1 capsule by mouth daily. [provider] Taking Active   NIFEdipine (PROCARDIA-XL/NIFEDICAL-XL) 30 MG 24 hr tablet [id number] No Take 30 mg by mouth daily. [provider] Taking Active   NOVOLOG FLEXPEN 100 UNIT/ML FlexPen [id number] No Inject 25 Units into the skin 3 (three) times daily with meals. Take 5 units at breakfast, 10 units at lunch and 10 units at dinner. [provider] Taking Active   simvastatin (ZOCOR) 20 MG tablet [id number]  TAKE 1 TABLET BY MOUTH EVERY EVENING Arnett, Yvetta Coder, FNP  Active   Vitamin D, Cholecalciferol, 10 MCG (400 UNIT) CAPS [id number] No Take 1 capsule by mouth daily. [provider] Taking Active             Patient Active Problem List   Diagnosis Date Noted   Aortic atherosclerosis ([place]) 06/22/2020   Benign hypertensive kidney disease with chronic kidney disease 08/17/2019   Proteinuria 08/17/2019   Secondary hyperparathyroidism of renal origin ([place]) 08/17/2019   Proliferative diabetic retinopathy associated with type 1 diabetes mellitus ([place]) 03/25/2019   Esophageal thickening 03/18/2019   Pain due to onychomycosis of toenails of both feet 12/14/2018   Diabetic neuropathy ([place]) 12/14/2018   Long-term insulin use ([place]) 08/03/2018   CKD (chronic kidney disease) 11/20/2016   CAD (coronary artery disease) 11/20/2016   Mitral valve disease 11/20/2016   Impaired vision 11/20/2016    Moderate mitral insufficiency 03/06/2015   DM type 1 with diabetic peripheral neuropathy ([place]) 03/01/2015   Uncontrolled type 1 diabetes mellitus with hyperglycemia, with long-term current use of insulin ([place]) 03/01/2015   Benign essential hypertension 10/03/2014   Routine general medical examination at a health care facility 12/14/2012   Obesity 12/14/2012   Hypertension 04/01/2011   Hyperlipidemia 04/01/2011   DM type 1 (diabetes mellitus, type 1) ([place]) 04/01/2011    Conditions to be addressed/monitored: HTN, HLD, and T1DM, CKD  Care Plan : Medication Management  Updates made by De Hollingshead, RPH-CPP since 02/15/2021 12:00 AM     Problem: T1DM, HTN, CKD      Long-Range Goal: Disease Progression Prevention   Start Date: 02/15/2021  This Visit's Progress: On track  Priority: High  Note:   Current Barriers:  Complex patient with multiple comorbidities including DM, HF, CKD   Pharmacist Clinical Goal(s):  Over the next 90 days, patient will achieve adherence  to monitoring guidelines and medication adherence to achieve therapeutic efficacy through collaboration with PharmD and provider.    Interventions: 1:1 collaboration with Chelsea Hawthorne, FNP regarding development and update of comprehensive plan of care as evidenced by provider attestation and co-signature Inter-disciplinary care team collaboration (see longitudinal plan of care) Comprehensive medication review performed; medication list updated in electronic medical record   Diabetes: Controlled; current treatment: Lantus 40 units QPM, Novolog 6 units with breakfast, 12 units with lunch and supper; Farxiga 10 mg; follows w/ Dr. Gabriel Carina endocrinology Calls today to report that one of her medications is >$100. She isn't sure which one. I advised her to call the pharmacy and figure out which medication is expensive. She called the pharmacy and whichever medication it is is now running for $8.  Recommended to continue  current regimen at this time  Heart Failure in the setting of CKD: Managed by Dr. Nehemiah Massed cardiology; current treatment:  ARNI/ACEi/ARB: enalapril 40 mg daily Beta blocker: carvedilol 12.5 mg BID SGLT2: Farxiga 10 mg daily Mineralocorticoid Receptor Antagonist: none, use limited by renal function Diuretic: none Additional agents: digoxin 0.125 mg daily, nifedipine XL 30 mg daily, hydralazine 5-10 mg PRN, infrequent use Most recent ECHO: ER 45-50% Strongly recommend collaboration with cardiology for appropriate digoxin monitoring given worsening renal function. Last digoxin level checked 08/2019, level was 0.8. Creatinine at that time was 1.7. Most recent creatinine per outside records is 2.4. Continue to monitor for signs of supratherapeutic levels, including nausea, vomiting, visual disturbances, or lethargy.    CAD, Hyperlipidemia: Near goal LDL <70; current treatment: simvastatin 20 mg daily Antiplatelet regimen: aspirin 81 mg daily Consider more stringent LDL goal <70 given DM with risk factors of CKD, and HTN. Consider dose increase of simvastatin to 40 mg daily vs change to higher potency statin   Patient Goals/Self-Care Activities Over the next 90 days, patient will:  - take medications as prescribed check glucose using CGM, document, and provide at future appointments check blood pressure daily, document, and provide at future appointments   Follow Up Plan: Scheduled follow up call in ~12 weeks       Medication Assistance:  None required.  Patient affirms current coverage meets needs.  Follow Up:  Patient agrees to Care Plan and Follow-up.  Plan: Telephone follow up appointment with care management team member scheduled for:  ~12 weeks  [name], PharmD, [place], [place] Clinical Pharmacist [employer] [facility] [employer] [phone]

## 2021-02-16 ENCOUNTER — Telehealth: Payer: Self-pay | Admitting: Pharmacist

## 2021-02-16 NOTE — Telephone Encounter (Signed)
Received voicemail from patient asking me to call her. Called back. Left voicemail.

## 2021-02-25 ENCOUNTER — Telehealth: Payer: Self-pay | Admitting: Family

## 2021-02-25 NOTE — Telephone Encounter (Signed)
FYI Chelsea Barton  Chelsea Barton.   Call patient.  I received excellent recommendations from Chelsea Barton, pharmD  Please call dr Alveria Apley ( cardiology) office and advise that pt has worsening renal function and she may require sooner digoxin monitoring, last Crt 2.4  Ask pt if agreeable to change to crestor '10mg'$  po qhs  Please discontinue simvastatin '40mg'$  and send in crestor '10mg'$ .   Strongly recommend collaboration with cardiology for appropriate digoxin monitoring given worsening renal function. Last digoxin level checked 08/2019, level was 0.8. Creatinine at that time was 1.7. Most recent creatinine per outside records is 2.4. Continue to monitor for signs of supratherapeutic levels, including nausea, vomiting, visual disturbances, or lethargy.    CAD, Hyperlipidemia: Near goal LDL <70; current treatment: simvastatin 20 mg daily Antiplatelet regimen: aspirin 81 mg daily Consider more stringent LDL goal <70 given DM with risk factors of CKD, and HTN. Consider dose increase of simvastatin to 40 mg daily vs change to higher potency statin

## 2021-02-26 ENCOUNTER — Other Ambulatory Visit: Payer: Self-pay

## 2021-02-26 DIAGNOSIS — E785 Hyperlipidemia, unspecified: Secondary | ICD-10-CM

## 2021-02-26 MED ORDER — ROSUVASTATIN CALCIUM 10 MG PO TABS: 10.0000 mg | ORAL_TABLET | Freq: Every day | ORAL | 3 refills | Status: DC

## 2021-02-26 NOTE — Telephone Encounter (Signed)
Can you order and sch CMP lab in 6 weeks after starting crestor?

## 2021-02-26 NOTE — Telephone Encounter (Signed)
FYI I called & spoke with [facility] Cardology Dr. Alveria Apley office. They sent message back to his nurse with recent creatinine numbers & that digoxin monitoring may need to be done sooner. They will call back if questions, but I asked them to reach out to patient.  She as agreeable to Crestor 10 mg & I have sent. Simvastatin was d/c off med list.

## 2021-02-26 NOTE — Telephone Encounter (Signed)
Patient scheduled.

## 2021-03-05 ENCOUNTER — Encounter: Payer: Self-pay | Admitting: Family

## 2021-03-05 ENCOUNTER — Ambulatory Visit (INDEPENDENT_AMBULATORY_CARE_PROVIDER_SITE_OTHER): Payer: Medicaid Other | Admitting: Family

## 2021-03-05 ENCOUNTER — Other Ambulatory Visit: Payer: Self-pay

## 2021-03-05 VITALS — BP 121/82 | HR 62 | Temp 98.6°F | Ht 65.0 in | Wt 196.0 lb

## 2021-03-05 DIAGNOSIS — Z23 Encounter for immunization: Secondary | ICD-10-CM | POA: Diagnosis not present

## 2021-03-05 DIAGNOSIS — I10 Essential (primary) hypertension: Secondary | ICD-10-CM | POA: Diagnosis not present

## 2021-03-05 DIAGNOSIS — E785 Hyperlipidemia, unspecified: Secondary | ICD-10-CM | POA: Diagnosis not present

## 2021-03-05 DIAGNOSIS — E1042 Type 1 diabetes mellitus with diabetic polyneuropathy: Secondary | ICD-10-CM | POA: Diagnosis not present

## 2021-03-05 DIAGNOSIS — E1039 Type 1 diabetes mellitus with other diabetic ophthalmic complication: Secondary | ICD-10-CM

## 2021-03-05 NOTE — Progress Notes (Signed)
Subjective:    Patient ID: Chelsea Barton, female    DOB: 1961/01/07, 60 y.o.   MRN: [id number]  CC: Chelsea Barton is a 60 y.o. female who presents today for follow up.   HPI: Feels well today No complaints.   LVH, CAD, HTN-following with Dr. Bea Laura, cardiology.  Compliant with  enalapril 40 mg daily, carvedilol 12.5 mg BID,Farxiga 10 mg daily, digoxin 0.125 mg daily, nifedipine XL 30 mg daily, hydralazine 5-10 mg PRN.  hydralazine uses approx 1-2 per week. No cp, sob, leg swelling.  CKD- last crt 2.4.  Follow-up with nephrology tomorrow. HLD- compliant with crestor '10mg'$ , started last week. She has been on simvastatin with crestor for 2-3 days. .  CMP labs scheduled for next week.  DM-following with endocrinology.  Compliant Lantus 40 units QPM, Novolog 6 units with breakfast, 12 units with lunch and supper; Farxiga 10 mg.  A1c 3 weeks ago 8 HISTORY:  Past Medical History:  Diagnosis Date   Diabetes mellitus    Hyperlipidemia    Hypertension    Past Surgical History:  Procedure Laterality Date   CATARACT EXTRACTION     eye surgery r Right    lense placed   Family History  Problem Relation Age of Onset   Diabetes Sister        3/5 sisters have DM   Breast cancer Neg Hx    Colon cancer Neg Hx     Allergies: Patient has no known allergies. Current Outpatient Medications on File Prior to Visit  Medication Sig Dispense Refill   aspirin 81 MG EC tablet Take 1 tablet (81 mg total) by mouth daily. Swallow whole. 30 tablet 12   carvedilol (COREG) 12.5 MG tablet Take 1 tablet (12.5 mg total) by mouth 2 (two) times daily with a meal. Take one tablet by mouth daily with a meal. 180 tablet 1   Continuous Blood Gluc Sensor (DEXCOM G6 SENSOR) MISC Place a new sensor every 10 days. Use to check blood sugar at least 4 times daily 9 each 3   Continuous Blood Gluc Transmit (DEXCOM G6 TRANSMITTER) MISC Use to check glucose at least 4 times daily 1 each 3   dapagliflozin propanediol  (FARXIGA) 10 MG TABS tablet Take 10 mg by mouth daily.     digoxin (LANOXIN) 0.125 MG tablet Take 1 tablet (125 mcg total) by mouth daily. 90 tablet 1   enalapril (VASOTEC) 20 MG tablet Take 2 tablets (40 mg total) by mouth daily. 180 tablet 3   hydrALAZINE (APRESOLINE) 10 MG tablet TAKE 1/2 TABLET BY MOUTH THREE TIMES DAILY AS NEEDED(TAKE IF BLOOD PRESSURE IS GREATER THAN 140/90) 30 tablet 1   insulin glargine (LANTUS) 100 UNIT/ML Solostar Pen Inject 40 Units into the skin daily.      Insulin Pen Needle (B-D ULTRAFINE III SHORT PEN) 31G X 8 MM MISC USE AS DIRECTED 100 each 6   Multiple Vitamin (MULTIVITAMIN) capsule Take 1 capsule by mouth daily.     NIFEdipine (PROCARDIA-XL/NIFEDICAL-XL) 30 MG 24 hr tablet Take 30 mg by mouth daily.     NOVOLOG FLEXPEN 100 UNIT/ML FlexPen Inject 25 Units into the skin 3 (three) times daily with meals. Take 5 units at breakfast, 10 units at lunch and 10 units at dinner.     rosuvastatin (CRESTOR) 10 MG tablet Take 1 tablet (10 mg total) by mouth daily. 90 tablet 3   Vitamin D, Cholecalciferol, 10 MCG (400 UNIT) CAPS Take 1 capsule by mouth daily.  No current facility-administered medications on file prior to visit.    Social History   Tobacco Use   Smoking status: Former    Types: Cigarettes    Quit date: 06/18/2005    Years since quitting: 15.7   Smokeless tobacco: Never  Substance Use Topics   Alcohol use: No   Drug use: No    Review of Systems  Constitutional:  Negative for chills and fever.  Respiratory:  Negative for cough and shortness of breath.   Cardiovascular:  Negative for chest pain, palpitations and leg swelling.  Gastrointestinal:  Negative for nausea and vomiting.     Objective:    BP 121/82 (BP Location: Left Arm, Patient Position: Sitting, Cuff Size: Normal)   Pulse 62   Temp 98.6 F (37 C) (Oral)   Ht '5\' 5"'$  (1.651 m)   Wt 196 lb (88.9 kg)   SpO2 97%   BMI 32.62 kg/m  BP Readings from Last 3 Encounters:  03/05/21  121/82  11/01/20 120/68  06/22/20 114/72   Wt Readings from Last 3 Encounters:  03/05/21 196 lb (88.9 kg)  11/01/20 192 lb 6.4 oz (87.3 kg)  06/22/20 196 lb (88.9 kg)    Physical Exam Vitals reviewed.  Constitutional:      Appearance: She is well-developed.  Eyes:     Conjunctiva/sclera: Conjunctivae normal.  Cardiovascular:     Rate and Rhythm: Normal rate and regular rhythm.     Pulses: Normal pulses.     Heart sounds: Normal heart sounds.  Pulmonary:     Effort: Pulmonary effort is normal.     Breath sounds: Normal breath sounds. No wheezing, rhonchi or rales.  Musculoskeletal:     Right lower leg: No edema.     Left lower leg: No edema.  Skin:    General: Skin is warm and dry.  Neurological:     Mental Status: She is alert.  Psychiatric:        Speech: Speech normal.        Behavior: Behavior normal.        Thought Content: Thought content normal.       Assessment & Plan:   Problem List Items Addressed This Visit       Cardiovascular and Mediastinum   Hypertension - Primary    Chronic, blood pressure well controlled.  No evidence of fluid volume overload. Continue enalapril 40 mg daily, carvedilol 12.5 mg BID,Farxiga 10 mg daily, digoxin 0.125 mg daily, nifedipine XL 30 mg daily, hydralazine 5-10 mg PRN.  I have ordered digoxin level with her next scheduled labs.  She declines having done today and  would like done with her upcoming physical.      Relevant Orders   Digoxin level     Endocrine   DM type 1 (diabetes mellitus, type 1) ([place])   DM type 1 with diabetic peripheral neuropathy ([facility])    Chronic, uncontrolled.  Following with endocrinology.  Will follow        Other   Hyperlipidemia    Uncontrolled.  We have started her on Crestor 10 mg.  She been taking simvastatin for 2 to 3 days with Crestor.  She has been advised immediately to stop simvastatin and take Crestor 10 mg going forward.  She verbalized understanding of this.  Pending CMP in 6 weeks  time      Other Visit Diagnoses     Need for shingles vaccine       Relevant Orders   Varicella-zoster  vaccine IM (Shingrix)        I have discontinued Pascha D. Kosch's Moderna COVID-19 Vaccine. I am also having her maintain her multivitamin, NovoLOG FlexPen, insulin glargine, B-D ULTRAFINE III SHORT PEN, Dexcom G6 Sensor, Dexcom G6 Transmitter, enalapril, digoxin, carvedilol, aspirin, dapagliflozin propanediol, NIFEdipine, Vitamin D (Cholecalciferol), hydrALAZINE, and rosuvastatin.   No orders of the defined types were placed in this encounter.   Return precautions given.   Risks, benefits, and alternatives of the medications and treatment plan prescribed today were discussed, and patient expressed understanding.   Education regarding symptom management and diagnosis given to patient on AVS.  Continue to follow with Burnard Hawthorne, FNP for routine health maintenance.   Breyana D Houpt and I agreed with plan.   Mable Paris, FNP

## 2021-03-05 NOTE — Assessment & Plan Note (Signed)
Chronic, blood pressure well controlled.  No evidence of fluid volume overload. Continue enalapril 40 mg daily, carvedilol 12.5 mg BID,Farxiga 10 mg daily, digoxin 0.125 mg daily, nifedipine XL 30 mg daily, hydralazine 5-10 mg PRN.  I have ordered digoxin level with her next scheduled labs.  She declines having done today and  would like done with her upcoming physical.

## 2021-03-05 NOTE — Patient Instructions (Addendum)
STOP SIMVASTATIN  YOU ARE ONLY SUPPOSED TO BE ON ROSUVASTATIN ( CRESTOR).   We recommend you get the updated bivalent COVID-19 booster, at least 2 months after any prior doses. You can find pharmacies that have this formulation in stock at [email or web address]    Nice to see you!

## 2021-03-05 NOTE — Assessment & Plan Note (Signed)
Chronic, uncontrolled.  Following with endocrinology.  Will follow

## 2021-03-05 NOTE — Assessment & Plan Note (Signed)
Chronic, stable.  Appointment with Dr. Juleen China tomorrow.  Will follow

## 2021-03-05 NOTE — Assessment & Plan Note (Signed)
Uncontrolled.  We have started her on Crestor 10 mg.  She been taking simvastatin for 2 to 3 days with Crestor.  She has been advised immediately to stop simvastatin and take Crestor 10 mg going forward.  She verbalized understanding of this.  Pending CMP in 6 weeks time

## 2021-03-13 ENCOUNTER — Other Ambulatory Visit: Payer: Self-pay | Admitting: Family

## 2021-04-10 ENCOUNTER — Observation Stay: Payer: Medicaid Other | Admitting: Anesthesiology

## 2021-04-10 ENCOUNTER — Encounter: Payer: Self-pay | Admitting: Internal Medicine

## 2021-04-10 ENCOUNTER — Ambulatory Visit
Admission: EM | Admit: 2021-04-10 | Discharge: 2021-04-10 | Payer: Medicaid Other | Attending: Urgent Care | Admitting: Urgent Care

## 2021-04-10 ENCOUNTER — Other Ambulatory Visit: Payer: Self-pay

## 2021-04-10 ENCOUNTER — Observation Stay: Payer: Medicaid Other

## 2021-04-10 ENCOUNTER — Inpatient Hospital Stay
Admission: EM | Admit: 2021-04-10 | Discharge: 2021-04-24 | DRG: 270 | Disposition: A | Discharge status: Home Health | Payer: Medicaid Other | Attending: Internal Medicine | Admitting: Internal Medicine

## 2021-04-10 ENCOUNTER — Encounter: Payer: Self-pay | Admitting: Emergency Medicine

## 2021-04-10 ENCOUNTER — Encounter
Admission: EM | Disposition: A | Discharge status: Home Health | Payer: Self-pay | Source: Home / Self Care | Attending: Internal Medicine

## 2021-04-10 ENCOUNTER — Ambulatory Visit (INDEPENDENT_AMBULATORY_CARE_PROVIDER_SITE_OTHER): Payer: Medicaid Other

## 2021-04-10 DIAGNOSIS — M726 Necrotizing fasciitis: Secondary | ICD-10-CM | POA: Diagnosis present

## 2021-04-10 DIAGNOSIS — M86172 Other acute osteomyelitis, left ankle and foot: Secondary | ICD-10-CM

## 2021-04-10 DIAGNOSIS — E1065 Type 1 diabetes mellitus with hyperglycemia: Secondary | ICD-10-CM

## 2021-04-10 DIAGNOSIS — R739 Hyperglycemia, unspecified: Secondary | ICD-10-CM

## 2021-04-10 DIAGNOSIS — S92592B Other fracture of left lesser toe(s), initial encounter for open fracture: Secondary | ICD-10-CM

## 2021-04-10 DIAGNOSIS — S92515A Nondisplaced fracture of proximal phalanx of left lesser toe(s), initial encounter for closed fracture: Secondary | ICD-10-CM | POA: Diagnosis present

## 2021-04-10 DIAGNOSIS — I70262 Atherosclerosis of native arteries of extremities with gangrene, left leg: Secondary | ICD-10-CM | POA: Diagnosis present

## 2021-04-10 DIAGNOSIS — I700 Atherosclerosis of aorta: Secondary | ICD-10-CM | POA: Diagnosis present

## 2021-04-10 DIAGNOSIS — M79672 Pain in left foot: Secondary | ICD-10-CM | POA: Diagnosis not present

## 2021-04-10 DIAGNOSIS — I130 Hypertensive heart and chronic kidney disease with heart failure and stage 1 through stage 4 chronic kidney disease, or unspecified chronic kidney disease: Secondary | ICD-10-CM | POA: Diagnosis present

## 2021-04-10 DIAGNOSIS — S92505A Nondisplaced unspecified fracture of left lesser toe(s), initial encounter for closed fracture: Secondary | ICD-10-CM

## 2021-04-10 DIAGNOSIS — N189 Chronic kidney disease, unspecified: Secondary | ICD-10-CM

## 2021-04-10 DIAGNOSIS — E1052 Type 1 diabetes mellitus with diabetic peripheral angiopathy with gangrene: Principal | ICD-10-CM | POA: Diagnosis present

## 2021-04-10 DIAGNOSIS — E08621 Diabetes mellitus due to underlying condition with foot ulcer: Secondary | ICD-10-CM

## 2021-04-10 DIAGNOSIS — N2581 Secondary hyperparathyroidism of renal origin: Secondary | ICD-10-CM | POA: Diagnosis present

## 2021-04-10 DIAGNOSIS — A480 Gas gangrene: Secondary | ICD-10-CM | POA: Diagnosis present

## 2021-04-10 DIAGNOSIS — Z09 Encounter for follow-up examination after completed treatment for conditions other than malignant neoplasm: Secondary | ICD-10-CM

## 2021-04-10 DIAGNOSIS — Z20822 Contact with and (suspected) exposure to covid-19: Secondary | ICD-10-CM | POA: Diagnosis present

## 2021-04-10 DIAGNOSIS — Z6831 Body mass index (BMI) 31.0-31.9, adult: Secondary | ICD-10-CM

## 2021-04-10 DIAGNOSIS — E10628 Type 1 diabetes mellitus with other skin complications: Secondary | ICD-10-CM | POA: Diagnosis present

## 2021-04-10 DIAGNOSIS — I96 Gangrene, not elsewhere classified: Secondary | ICD-10-CM

## 2021-04-10 DIAGNOSIS — L97502 Non-pressure chronic ulcer of other part of unspecified foot with fat layer exposed: Secondary | ICD-10-CM

## 2021-04-10 DIAGNOSIS — E669 Obesity, unspecified: Secondary | ICD-10-CM | POA: Diagnosis present

## 2021-04-10 DIAGNOSIS — E10621 Type 1 diabetes mellitus with foot ulcer: Secondary | ICD-10-CM | POA: Diagnosis present

## 2021-04-10 DIAGNOSIS — N184 Chronic kidney disease, stage 4 (severe): Secondary | ICD-10-CM | POA: Diagnosis present

## 2021-04-10 DIAGNOSIS — D631 Anemia in chronic kidney disease: Secondary | ICD-10-CM | POA: Diagnosis present

## 2021-04-10 DIAGNOSIS — L97521 Non-pressure chronic ulcer of other part of left foot limited to breakdown of skin: Secondary | ICD-10-CM | POA: Diagnosis present

## 2021-04-10 DIAGNOSIS — L03116 Cellulitis of left lower limb: Secondary | ICD-10-CM | POA: Diagnosis present

## 2021-04-10 DIAGNOSIS — Z794 Long term (current) use of insulin: Secondary | ICD-10-CM

## 2021-04-10 DIAGNOSIS — M6289 Other specified disorders of muscle: Secondary | ICD-10-CM

## 2021-04-10 DIAGNOSIS — I2510 Atherosclerotic heart disease of native coronary artery without angina pectoris: Secondary | ICD-10-CM | POA: Diagnosis present

## 2021-04-10 DIAGNOSIS — Z87891 Personal history of nicotine dependence: Secondary | ICD-10-CM

## 2021-04-10 DIAGNOSIS — I059 Rheumatic mitral valve disease, unspecified: Secondary | ICD-10-CM | POA: Diagnosis present

## 2021-04-10 DIAGNOSIS — Z79899 Other long term (current) drug therapy: Secondary | ICD-10-CM

## 2021-04-10 DIAGNOSIS — E1022 Type 1 diabetes mellitus with diabetic chronic kidney disease: Secondary | ICD-10-CM | POA: Diagnosis present

## 2021-04-10 DIAGNOSIS — N179 Acute kidney failure, unspecified: Secondary | ICD-10-CM | POA: Diagnosis present

## 2021-04-10 DIAGNOSIS — E785 Hyperlipidemia, unspecified: Secondary | ICD-10-CM | POA: Diagnosis present

## 2021-04-10 DIAGNOSIS — Z7982 Long term (current) use of aspirin: Secondary | ICD-10-CM

## 2021-04-10 DIAGNOSIS — L0390 Cellulitis, unspecified: Secondary | ICD-10-CM | POA: Diagnosis not present

## 2021-04-10 DIAGNOSIS — I340 Nonrheumatic mitral (valve) insufficiency: Secondary | ICD-10-CM | POA: Diagnosis present

## 2021-04-10 DIAGNOSIS — I10 Essential (primary) hypertension: Secondary | ICD-10-CM | POA: Diagnosis present

## 2021-04-10 DIAGNOSIS — E1069 Type 1 diabetes mellitus with other specified complication: Secondary | ICD-10-CM | POA: Diagnosis present

## 2021-04-10 DIAGNOSIS — Z7902 Long term (current) use of antithrombotics/antiplatelets: Secondary | ICD-10-CM

## 2021-04-10 DIAGNOSIS — Z89432 Acquired absence of left foot: Secondary | ICD-10-CM

## 2021-04-10 DIAGNOSIS — E1042 Type 1 diabetes mellitus with diabetic polyneuropathy: Secondary | ICD-10-CM | POA: Diagnosis present

## 2021-04-10 DIAGNOSIS — E103599 Type 1 diabetes mellitus with proliferative diabetic retinopathy without macular edema, unspecified eye: Secondary | ICD-10-CM | POA: Diagnosis present

## 2021-04-10 HISTORY — PX: INCISION AND DRAINAGE OF WOUND: SHX1803

## 2021-04-10 HISTORY — PX: AMPUTATION TOE: SHX6595

## 2021-04-10 LAB — APTT: aPTT: 35 seconds (ref 24–36)

## 2021-04-10 LAB — COMPREHENSIVE METABOLIC PANEL
ALT: 20 U/L (ref 0–44)
AST: 22 U/L (ref 15–41)
Albumin: 2.8 g/dL — ABNORMAL LOW (ref 3.5–5.0)
Alkaline Phosphatase: 108 U/L (ref 38–126)
Anion gap: 10 (ref 5–15)
BUN: 40 mg/dL — ABNORMAL HIGH (ref 8–23)
CO2: 26 mmol/L (ref 22–32)
Calcium: 9 mg/dL (ref 8.9–10.3)
Chloride: 102 mmol/L (ref 98–111)
Creatinine, Ser: 2.7 mg/dL — ABNORMAL HIGH (ref 0.44–1.00)
GFR, Estimated: 19 mL/min — ABNORMAL LOW (ref >60)
Glucose, Bld: 213 mg/dL — ABNORMAL HIGH (ref 70–99)
Potassium: 4.1 mmol/L (ref 3.5–5.1)
Sodium: 138 mmol/L (ref 135–145)
Total Bilirubin: 1.1 mg/dL (ref 0.3–1.2)
Total Protein: 7.5 g/dL (ref 6.5–8.1)

## 2021-04-10 LAB — RESP PANEL BY RT-PCR (FLU A&B, COVID) ARPGX2
Influenza A by PCR: NEGATIVE
Influenza B by PCR: NEGATIVE
SARS Coronavirus 2 by RT PCR: NEGATIVE

## 2021-04-10 LAB — CBC WITH DIFFERENTIAL/PLATELET
Abs Immature Granulocytes: 1.04 10*3/uL — ABNORMAL HIGH (ref 0.00–0.07)
Basophils Absolute: 0.1 10*3/uL (ref 0.0–0.1)
Basophils Relative: 0 %
Eosinophils Absolute: 0.1 10*3/uL (ref 0.0–0.5)
Eosinophils Relative: 1 %
HCT: 35.6 % — ABNORMAL LOW (ref 36.0–46.0)
Hemoglobin: 11.3 g/dL — ABNORMAL LOW (ref 12.0–15.0)
Immature Granulocytes: 4 %
Lymphocytes Relative: 4 %
Lymphs Abs: 1.1 10*3/uL (ref 0.7–4.0)
MCH: 27.9 pg (ref 26.0–34.0)
MCHC: 31.7 g/dL (ref 30.0–36.0)
MCV: 87.9 fL (ref 80.0–100.0)
Monocytes Absolute: 2.3 10*3/uL — ABNORMAL HIGH (ref 0.1–1.0)
Monocytes Relative: 9 %
Neutro Abs: 21.5 10*3/uL — ABNORMAL HIGH (ref 1.7–7.7)
Neutrophils Relative %: 82 %
Platelets: 243 10*3/uL (ref 150–400)
RBC: 4.05 MIL/uL (ref 3.87–5.11)
RDW: 13.5 % (ref 11.5–15.5)
Smear Review: ADEQUATE
WBC: 26.1 10*3/uL — ABNORMAL HIGH (ref 4.0–10.5)
nRBC: 0 % (ref 0.0–0.2)

## 2021-04-10 LAB — DIGOXIN LEVEL: Digoxin Level: 0.5 ng/mL — ABNORMAL LOW (ref 0.8–2.0)

## 2021-04-10 LAB — PROTIME-INR
INR: 1.3 — ABNORMAL HIGH (ref 0.8–1.2)
Prothrombin Time: 16 seconds — ABNORMAL HIGH (ref 11.4–15.2)

## 2021-04-10 LAB — CBG MONITORING, ED
Glucose-Capillary: 205 mg/dL — ABNORMAL HIGH (ref 70–99)
Glucose-Capillary: 174 mg/dL — ABNORMAL HIGH (ref 70–99)

## 2021-04-10 LAB — LACTIC ACID, PLASMA
Lactic Acid, Venous: 1.9 mmol/L (ref 0.5–1.9)
Lactic Acid, Venous: 1.5 mmol/L (ref 0.5–1.9)

## 2021-04-10 LAB — C-REACTIVE PROTEIN: CRP: 27.8 mg/dL — ABNORMAL HIGH (ref <1.0)

## 2021-04-10 LAB — SEDIMENTATION RATE: Sed Rate: 72 mm/hr — ABNORMAL HIGH (ref 0–30)

## 2021-04-10 SURGERY — IRRIGATION AND DEBRIDEMENT WOUND
Anesthesia: General | Site: Toe | Laterality: Left

## 2021-04-10 MED ORDER — FENTANYL CITRATE (PF) 100 MCG/2ML IJ SOLN
INTRAMUSCULAR | Status: DC | PRN
  Administered 2021-04-10: 50 ug via INTRAVENOUS

## 2021-04-10 MED ORDER — ACETAMINOPHEN 650 MG RE SUPP: 650.0000 mg | Freq: Four times a day (QID) | RECTAL | Status: DC | PRN

## 2021-04-10 MED ORDER — HYDRALAZINE HCL 10 MG PO TABS
10.0000 mg | ORAL_TABLET | Freq: Four times a day (QID) | ORAL | Status: DC | PRN
  Filled 2021-04-10: qty 1

## 2021-04-10 MED ORDER — NIFEDIPINE ER OSMOTIC RELEASE 30 MG PO TB24
30.0000 mg | ORAL_TABLET | Freq: Every day | ORAL | Status: DC
  Administered 2021-04-11 – 2021-04-13 (×3): 30 mg via ORAL
  Filled 2021-04-10 (×4): qty 1

## 2021-04-10 MED ORDER — ONDANSETRON HCL 4 MG/2ML IJ SOLN
4.0000 mg | Freq: Four times a day (QID) | INTRAMUSCULAR | Status: DC | PRN
  Administered 2021-04-12 – 2021-04-14 (×3): 4 mg via INTRAVENOUS
  Filled 2021-04-10 (×2): qty 2

## 2021-04-10 MED ORDER — CLINDAMYCIN PHOSPHATE 600 MG/50ML IV SOLN: 600.0000 mg | Freq: Three times a day (TID) | INTRAVENOUS | Status: DC

## 2021-04-10 MED ORDER — LINEZOLID 600 MG/300ML IV SOLN
600.0000 mg | Freq: Two times a day (BID) | INTRAVENOUS | Status: DC
  Administered 2021-04-10 – 2021-04-18 (×15): 600 mg via INTRAVENOUS
  Filled 2021-04-10 (×19): qty 300

## 2021-04-10 MED ORDER — PROPOFOL 500 MG/50ML IV EMUL
INTRAVENOUS | Status: DC | PRN
  Administered 2021-04-10: 80 ug/kg/min via INTRAVENOUS

## 2021-04-10 MED ORDER — LACTATED RINGERS IV BOLUS
1000.0000 mL | Freq: Once | INTRAVENOUS | Status: AC
  Administered 2021-04-10: 1000 mL via INTRAVENOUS

## 2021-04-10 MED ORDER — SODIUM CHLORIDE 0.9 % IR SOLN
Status: DC | PRN
  Administered 2021-04-10: 3000 mL

## 2021-04-10 MED ORDER — INSULIN GLARGINE-YFGN 100 UNIT/ML ~~LOC~~ SOLN
20.0000 [IU] | Freq: Once | SUBCUTANEOUS | Status: DC
  Filled 2021-04-10: qty 0.2

## 2021-04-10 MED ORDER — INSULIN GLARGINE-YFGN 100 UNIT/ML ~~LOC~~ SOLN
40.0000 [IU] | Freq: Every day | SUBCUTANEOUS | Status: DC
  Filled 2021-04-10 (×2): qty 0.4

## 2021-04-10 MED ORDER — SODIUM CHLORIDE 0.9 % IV SOLN: INTRAVENOUS | Status: DC | PRN

## 2021-04-10 MED ORDER — VANCOMYCIN HCL 2000 MG/400ML IV SOLN
2000.0000 mg | INTRAVENOUS | Status: DC
  Filled 2021-04-10: qty 400

## 2021-04-10 MED ORDER — ROSUVASTATIN CALCIUM 10 MG PO TABS
10.0000 mg | ORAL_TABLET | Freq: Every day | ORAL | Status: DC
  Administered 2021-04-10 – 2021-04-23 (×14): 10 mg via ORAL
  Filled 2021-04-10 (×16): qty 1

## 2021-04-10 MED ORDER — INSULIN ASPART 100 UNIT/ML IJ SOLN
0.0000 [IU] | INTRAMUSCULAR | Status: DC
  Administered 2021-04-11 (×2): 5 [IU] via SUBCUTANEOUS

## 2021-04-10 MED ORDER — DAPAGLIFLOZIN PROPANEDIOL 5 MG PO TABS
10.0000 mg | ORAL_TABLET | Freq: Every day | ORAL | Status: DC
  Administered 2021-04-11 – 2021-04-21 (×11): 10 mg via ORAL
  Filled 2021-04-10: qty 2
  Filled 2021-04-10: qty 1
  Filled 2021-04-10 (×2): qty 2
  Filled 2021-04-10: qty 1
  Filled 2021-04-10 (×2): qty 2
  Filled 2021-04-10: qty 1
  Filled 2021-04-10 (×3): qty 2
  Filled 2021-04-10 (×2): qty 1
  Filled 2021-04-10 (×2): qty 2

## 2021-04-10 MED ORDER — MORPHINE SULFATE (PF) 2 MG/ML IV SOLN: 2.0000 mg | INTRAVENOUS | Status: DC | PRN

## 2021-04-10 MED ORDER — DIGOXIN 125 MCG PO TABS
125.0000 ug | ORAL_TABLET | Freq: Every day | ORAL | Status: DC
  Administered 2021-04-11 – 2021-04-24 (×14): 125 ug via ORAL
  Filled 2021-04-10 (×14): qty 1

## 2021-04-10 MED ORDER — ACETAMINOPHEN 325 MG PO TABS
650.0000 mg | ORAL_TABLET | Freq: Four times a day (QID) | ORAL | Status: DC | PRN
  Administered 2021-04-13: 650 mg via ORAL
  Filled 2021-04-10: qty 2

## 2021-04-10 MED ORDER — PIPERACILLIN-TAZOBACTAM 3.375 G IVPB 30 MIN
3.3750 g | Freq: Once | INTRAVENOUS | Status: AC
  Administered 2021-04-10: 3.375 g via INTRAVENOUS
  Filled 2021-04-10: qty 50

## 2021-04-10 MED ORDER — CARVEDILOL 12.5 MG PO TABS
12.5000 mg | ORAL_TABLET | Freq: Two times a day (BID) | ORAL | Status: DC
  Administered 2021-04-11 – 2021-04-16 (×9): 12.5 mg via ORAL
  Filled 2021-04-10 (×10): qty 1

## 2021-04-10 MED ORDER — MIDAZOLAM HCL 2 MG/2ML IJ SOLN
INTRAMUSCULAR | Status: AC
  Filled 2021-04-10: qty 2

## 2021-04-10 MED ORDER — 0.9 % SODIUM CHLORIDE (POUR BTL) OPTIME
TOPICAL | Status: DC | PRN
  Administered 2021-04-10: 20 mL

## 2021-04-10 MED ORDER — INSULIN ASPART 100 UNIT/ML IJ SOLN
0.0000 [IU] | Freq: Three times a day (TID) | INTRAMUSCULAR | Status: DC
  Administered 2021-04-10: 3 [IU] via SUBCUTANEOUS
  Filled 2021-04-10: qty 1

## 2021-04-10 MED ORDER — PIPERACILLIN-TAZOBACTAM 3.375 G IVPB
3.3750 g | Freq: Three times a day (TID) | INTRAVENOUS | Status: DC
  Administered 2021-04-10 – 2021-04-13 (×7): 3.375 g via INTRAVENOUS
  Filled 2021-04-10 (×6): qty 50

## 2021-04-10 MED ORDER — INSULIN GLARGINE 100 UNIT/ML SOLOSTAR PEN: 40.0000 [IU] | PEN_INJECTOR | Freq: Every day | SUBCUTANEOUS | Status: DC

## 2021-04-10 MED ORDER — INSULIN ASPART 100 UNIT/ML IJ SOLN
0.0000 [IU] | Freq: Every day | INTRAMUSCULAR | Status: DC
  Administered 2021-04-11: 3 [IU] via SUBCUTANEOUS
  Administered 2021-04-16 – 2021-04-19 (×2): 2 [IU] via SUBCUTANEOUS
  Filled 2021-04-10 (×3): qty 1

## 2021-04-10 MED ORDER — MIDAZOLAM HCL 2 MG/2ML IJ SOLN
INTRAMUSCULAR | Status: DC | PRN
  Administered 2021-04-10: 2 mg via INTRAVENOUS

## 2021-04-10 MED ORDER — PROPOFOL 10 MG/ML IV BOLUS
INTRAVENOUS | Status: DC | PRN
Start: 2021-04-10 — End: 2021-04-10
  Administered 2021-04-10: 40 mg via INTRAVENOUS

## 2021-04-10 MED ORDER — PROPOFOL 10 MG/ML IV BOLUS
INTRAVENOUS | Status: AC
  Filled 2021-04-10: qty 20

## 2021-04-10 MED ORDER — LIDOCAINE HCL 1 % IJ SOLN
INTRAMUSCULAR | Status: DC | PRN
  Administered 2021-04-10: 20 mL

## 2021-04-10 MED ORDER — FENTANYL CITRATE (PF) 100 MCG/2ML IJ SOLN
INTRAMUSCULAR | Status: AC
  Filled 2021-04-10: qty 2

## 2021-04-10 MED ORDER — ONDANSETRON HCL 4 MG PO TABS: 4.0000 mg | ORAL_TABLET | Freq: Four times a day (QID) | ORAL | Status: DC | PRN

## 2021-04-10 MED ORDER — CLINDAMYCIN PHOSPHATE 900 MG/50ML IV SOLN: 900.0000 mg | Freq: Once | INTRAVENOUS | Status: DC

## 2021-04-10 MED ORDER — OXYCODONE HCL 5 MG PO TABS: 5.0000 mg | ORAL_TABLET | ORAL | Status: DC | PRN

## 2021-04-10 MED ORDER — PIPERACILLIN-TAZOBACTAM 3.375 G IVPB
INTRAVENOUS | Status: AC
  Filled 2021-04-10: qty 50

## 2021-04-10 MED ORDER — SODIUM CHLORIDE FLUSH 0.9 % IV SOLN
INTRAVENOUS | Status: AC
  Administered 2021-04-10: 1 mL
  Filled 2021-04-10: qty 3

## 2021-04-10 MED ORDER — FENTANYL CITRATE (PF) 100 MCG/2ML IJ SOLN: 25.0000 ug | INTRAMUSCULAR | Status: DC | PRN

## 2021-04-10 SURGICAL SUPPLY — 64 items
BLADE SURG 10 STRL SS SAFETY (BLADE) ×3 IMPLANT
BNDG COHESIVE 4X5 TAN ST LF (GAUZE/BANDAGES/DRESSINGS) IMPLANT
BNDG COHESIVE 6X5 TAN ST LF (GAUZE/BANDAGES/DRESSINGS) IMPLANT
BNDG CONFORM 2 STRL LF (GAUZE/BANDAGES/DRESSINGS) IMPLANT
BNDG CONFORM 3 STRL LF (GAUZE/BANDAGES/DRESSINGS) IMPLANT
BNDG ELASTIC 4X5.8 VLCR NS LF (GAUZE/BANDAGES/DRESSINGS) ×3 IMPLANT
BNDG ELASTIC 4X5.8 VLCR STR LF (GAUZE/BANDAGES/DRESSINGS) IMPLANT
BNDG ESMARK 4X12 TAN STRL LF (GAUZE/BANDAGES/DRESSINGS) IMPLANT
BNDG GAUZE ELAST 4 BULKY (GAUZE/BANDAGES/DRESSINGS) ×3 IMPLANT
BNDG STRETCH 4X75 STRL LF (GAUZE/BANDAGES/DRESSINGS) IMPLANT
CUFF TOURN SGL QUICK 18X4 (TOURNIQUET CUFF) IMPLANT
CUFF TOURN SGL QUICK 24 (TOURNIQUET CUFF)
CUFF TRNQT CYL 24X4X16.5-23 (TOURNIQUET CUFF) IMPLANT
DRAIN PENROSE 12X.25 LTX STRL (MISCELLANEOUS) IMPLANT
DRSG EMULSION OIL 3X8 NADH (GAUZE/BANDAGES/DRESSINGS) IMPLANT
DURAPREP 26ML APPLICATOR (WOUND CARE) ×3 IMPLANT
ELECT REM PT RETURN 9FT ADLT (ELECTROSURGICAL) ×3
ELECTRODE REM PT RTRN 9FT ADLT (ELECTROSURGICAL) ×2 IMPLANT
GAUZE 4X4 16PLY ~~LOC~~+RFID DBL (SPONGE) ×3 IMPLANT
GAUZE PACKING 1/4 X5 YD (GAUZE/BANDAGES/DRESSINGS) IMPLANT
GAUZE PACKING IODOFORM 1/2 (PACKING) IMPLANT
GAUZE PACKING IODOFORM 1X5 (PACKING) IMPLANT
GAUZE SPONGE 4X4 12PLY STRL (GAUZE/BANDAGES/DRESSINGS) IMPLANT
GLOVE SRG 8 PF TXTR STRL LF DI (GLOVE) ×2 IMPLANT
GLOVE SURG ENC TEXT LTX SZ8 (GLOVE) ×3 IMPLANT
GLOVE SURG UNDER POLY LF SZ8 (GLOVE) ×1
GOWN STRL REUS W/ TWL XL LVL3 (GOWN DISPOSABLE) ×2 IMPLANT
GOWN STRL REUS W/TWL MED LVL3 (GOWN DISPOSABLE) ×3 IMPLANT
GOWN STRL REUS W/TWL XL LVL3 (GOWN DISPOSABLE) ×1
HANDLE YANKAUER SUCT BULB TIP (MISCELLANEOUS) IMPLANT
HANDPIECE VERSAJET DEBRIDEMENT (MISCELLANEOUS) IMPLANT
IV NS 1000ML (IV SOLUTION) ×1
IV NS 1000ML BAXH (IV SOLUTION) ×2 IMPLANT
IV NS IRRIG 3000ML ARTHROMATIC (IV SOLUTION) ×3 IMPLANT
KIT TURNOVER KIT A (KITS) ×3 IMPLANT
LABEL OR SOLS (LABEL) ×3 IMPLANT
MANIFOLD NEPTUNE II (INSTRUMENTS) ×3 IMPLANT
NDL SAFETY ECLIPSE 18X1.5 (NEEDLE) ×2 IMPLANT
NEEDLE FILTER BLUNT 18X 1/2SAF (NEEDLE) ×1
NEEDLE FILTER BLUNT 18X1 1/2 (NEEDLE) ×2 IMPLANT
NEEDLE HYPO 18GX1.5 SHARP (NEEDLE) ×1
NEEDLE HYPO 25X1 1.5 SAFETY (NEEDLE) ×3 IMPLANT
NS IRRIG 500ML POUR BTL (IV SOLUTION) ×3 IMPLANT
PACK EXTREMITY ARMC (MISCELLANEOUS) ×3 IMPLANT
PAD ABD DERMACEA PRESS 5X9 (GAUZE/BANDAGES/DRESSINGS) IMPLANT
PULSAVAC PLUS IRRIG FAN TIP (DISPOSABLE) ×3
SOL PREP PVP 2OZ (MISCELLANEOUS) ×3
SOLUTION PREP PVP 2OZ (MISCELLANEOUS) ×2 IMPLANT
SPONGE T-LAP 18X18 ~~LOC~~+RFID (SPONGE) IMPLANT
STAPLER SKIN PROX 35W (STAPLE) IMPLANT
STOCKINETTE IMPERVIOUS 9X36 MD (GAUZE/BANDAGES/DRESSINGS) ×3 IMPLANT
STOCKINETTE M/LG 89821 (MISCELLANEOUS) IMPLANT
SUT ETHILON 2 0 FS 18 (SUTURE) IMPLANT
SUT ETHILON 4-0 (SUTURE)
SUT ETHILON 4-0 FS2 18XMFL BLK (SUTURE)
SUT PROLENE 3 0 PS 2 (SUTURE) IMPLANT
SUT VIC AB 3-0 SH 27 (SUTURE)
SUT VIC AB 3-0 SH 27X BRD (SUTURE) IMPLANT
SUT VIC AB 4-0 FS2 27 (SUTURE) IMPLANT
SUTURE ETHLN 4-0 FS2 18XMF BLK (SUTURE) IMPLANT
SWAB CULTURE AMIES ANAERIB BLU (MISCELLANEOUS) ×3 IMPLANT
SYR 10ML LL (SYRINGE) ×6 IMPLANT
TIP FAN IRRIG PULSAVAC PLUS (DISPOSABLE) ×2 IMPLANT
WATER STERILE IRR 500ML POUR (IV SOLUTION) ×3 IMPLANT

## 2021-04-10 NOTE — Discharge Instructions (Addendum)
Please report to the emergency room now as you will need further treatment of your left foot including management of a toe fracture and infection of the soft tissue around the foot of the fracture.

## 2021-04-10 NOTE — Consult Note (Signed)
PHARMACY -  BRIEF ANTIBIOTIC NOTE   Pharmacy has received consult(s) for Vancomycin + Zosyn from an ED provider.  The patient's profile has been reviewed for ht/wt/allergies/indication/available labs.    One time order(s) placed for  Vancomycin 2000 mg Zosyn 3.375 x 1   Further antibiotics/pharmacy consults should be ordered by admitting physician if indicated.                       Thank you, Dorothe Pea, PharmD, BCPS Clinical Pharmacist   04/10/2021  2:48 PM

## 2021-04-10 NOTE — H&P (Signed)
History and Physical   Chelsea Barton ZWC:[id number] DOB: 14-Nov-1960 DOA: 04/10/2021  PCP: Burnard Hawthorne, FNP  Outpatient Specialists: Dr. Nehemiah Massed, [facility] Patient coming from: home   I have personally briefly reviewed patient's old medical records in [place].  Chief Concern: Left toe wounds  HPI: Chelsea Barton is a 60 y.o. female with medical history significant for CKD stage IV, primary hypertension, insulin-dependent diabetes mellitus Type I, left ventricular hypertrophy, hypertensive heart disease without heart failure, moderate mitral insufficiency, secondary hyperparathyroidism, proteinuria, hyperlipidemia,  She reports her foot started swelling about two days ago. She is not aware of any known trauma.  She denies awareness of any bug bites and/or prior wounds.  She reports this is never happened before.  She reports that two months ago, she went to podiatry and was told that something may have been there but didn't know what it was.   She denies any pain. She only came in because of blistering and swelling. She denies fever, chest pain, shortness of breath, nausea, vomiting, diarrhea, abdominal pain, dysuria, LOC, syncope.   She reports that she last ate PO evening of 04/09/21.   Social history: She lives with her sister. She denies tobacco use currently.. She quit 20-30 years. She denies etoh and recreatioanl drug use. 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   Vaccination history: She is vaccinated for covid 19, 5 shots of Moderna.   ROS: Constitutional: no weight change, no fever ENT/Mouth: no sore throat, no rhinorrhea Eyes: no eye pain, no vision changes Cardiovascular: no chest pain, no dyspnea,  + edema of left lower extremity, no palpitations Respiratory: no cough, no sputum, no wheezing Gastrointestinal: no nausea, no vomiting, no diarrhea, no constipation Genitourinary: no urinary incontinence, no dysuria, no  hematuria Musculoskeletal: no arthralgias, no myalgias Skin: + skin lesions, no pruritus, Neuro: + weakness, no loss of consciousness, no syncope Psych: no anxiety, no depression, + decrease appetite Heme/Lymph: no bruising, no bleeding  ED Course: Discussed with emergency medicine provider, patient requiring hospitalization for chief concerns of left lower extremity necrotic foot wound.  Vitals in the emergency department was remarkable for temperature of 98.4, respiration rate of 18, heart rate of 72, blood pressure 140/55, SPO2 of 97% on room air.  Labs in the emergency department was remarkable for sodium 138, potassium 4.1, chloride 102, bicarb 26, BUN of 40, serum creatinine of 2.70, nonfasting blood glucose 217, GFR 19, lactic acid 1.9.  WBC elevated at 26.1, hemoglobin 11.3, platelets 243.  Assessment/Plan  Principal Problem:   Necrotizing cellulitis Active Problems:   Hypertension   Hyperlipidemia   Obesity   CKD (chronic kidney disease)   CAD (coronary artery disease)   Mitral valve disease   Benign essential hypertension   Long-term insulin use (HCC)   Secondary hyperparathyroidism of renal origin ([place])   Proliferative diabetic retinopathy associated with type 1 diabetes mellitus ([place])   # Left foot wound-suspect necrotic tissue - EDP has consulted podiatry, who states that patient will get to the OR today - Discussed case with podiatrist via secure chat and he states he will order MRI and vascular ABI imaging - Continue broad-spectrum antibiotics to cover for MRSA and Pseudomonas - Continue with linezolid, Zosyn, clindamycin - Blood cultures, 2 sets ordered - Check MRSA - Check sed rate, CRP baseline - Continue n.p.o.  # Acute kidney injury on CKD 4 - Serum creatinine on presentation is 2.7 - Prior serum creatinine in care  everywhere is 2.3-2.4, GFR 25-26 - Avoid vancomycin with Zosyn at this time - CMP in the a.m.  # On digoxin-check digoxin level -Digoxin  has been resumed at 125 mcg daily for 04/11/2021  # Hypertension # Hypertensive heart failure - Resumed Farxiga 10 mg daily, carvedilol 12.5 mg p.o. twice daily, hydralazine 10 mg every 6 hours, prn for SBP greater than 180, 4 days ordered; nifedipine 30 mg daily  # Hyperlipidemia-rosuvastatin 10 mg nightly  # Insulin-dependent diabetes mellitus - Patient takes glargine 40 units daily, this has been resumed on 04/11/2021 - Given the patient's currently n.p.o., I have given 1 dose of insulin glargine 20 units right now - Added insulin SSI with at bedtime coverage  Chart reviewed.   DVT prophylaxis: TED hose Code Status: Full code Diet: N.p.o. Family Communication: No Disposition Plan: Pending clinical course Consults called: Podiatry Admission status: MedSurg, observation, telemetry ordered for 24 hours  Past Medical History:  Diagnosis Date   Diabetes mellitus    Hyperlipidemia    Hypertension    Past Surgical History:  Procedure Laterality Date   CATARACT EXTRACTION     eye surgery r Right    lense placed   Social History:  reports that she quit smoking about 15 years ago. Her smoking use included cigarettes. She has never used smokeless tobacco. She reports that she does not drink alcohol and does not use drugs.  No Known Allergies Family History  Problem Relation Age of Onset   Diabetes Sister        32/5 sisters have DM   Breast cancer Neg Hx    Colon cancer Neg Hx    Family history: Family history reviewed and not pertinent  Prior to Admission medications   Medication Sig Start Date End Date Taking? Authorizing Provider  aspirin 81 MG EC tablet Take 1 tablet (81 mg total) by mouth daily. Swallow whole. 02/29/20   Burnard Hawthorne, FNP  carvedilol (COREG) 12.5 MG tablet Take 1 tablet (12.5 mg total) by mouth 2 (two) times daily with a meal. Take one tablet by mouth daily with a meal. 02/29/20   Arnett, Yvetta Coder, FNP  Continuous Blood Gluc Sensor (DEXCOM G6  SENSOR) MISC Place a new sensor every 10 days. Use to check blood sugar at least 4 times daily 02/08/20   Burnard Hawthorne, FNP  Continuous Blood Gluc Transmit (DEXCOM G6 TRANSMITTER) MISC Use to check glucose at least 4 times daily 02/08/20   Burnard Hawthorne, FNP  dapagliflozin propanediol (FARXIGA) 10 MG TABS tablet Take 10 mg by mouth daily.    [provider]  digoxin (LANOXIN) 0.125 MG tablet Take 1 tablet (125 mcg total) by mouth daily. 02/29/20   Burnard Hawthorne, FNP  enalapril (VASOTEC) 20 MG tablet Take 2 tablets (40 mg total) by mouth daily. 02/29/20   Burnard Hawthorne, FNP  hydrALAZINE (APRESOLINE) 10 MG tablet TAKE 1/2 TABLET BY MOUTH THREE TIMES DAILY AS NEEDED(TAKE IF BLOOD PRESSURE IS GREATER THAN 140/90) 03/13/21   Burnard Hawthorne, FNP  insulin glargine (LANTUS) 100 UNIT/ML Solostar Pen Inject 40 Units into the skin daily.  07/20/18   [provider]  Insulin Pen Needle (B-D ULTRAFINE III SHORT PEN) 31G X 8 MM MISC USE AS DIRECTED 06/25/19   Burnard Hawthorne, FNP  Multiple Vitamin (MULTIVITAMIN) capsule Take 1 capsule by mouth daily.    [provider]  NIFEdipine (PROCARDIA-XL/NIFEDICAL-XL) 30 MG 24 hr tablet Take 30 mg by mouth daily.  06/26/20 06/26/21  [provider]  NOVOLOG FLEXPEN 100 UNIT/ML FlexPen Inject 25 Units into the skin 3 (three) times daily with meals. Take 5 units at breakfast, 10 units at lunch and 10 units at dinner. 07/18/18   [provider]  rosuvastatin (CRESTOR) 10 MG tablet Take 1 tablet (10 mg total) by mouth daily. 02/26/21   Burnard Hawthorne, FNP  Vitamin D, Cholecalciferol, 10 MCG (400 UNIT) CAPS Take 1 capsule by mouth daily.    [provider]   Physical Exam: Vitals:   04/10/21 1242 04/10/21 1244 04/10/21 1702 04/10/21 1800  BP:  (!) 140/55 (!) 172/60 (!) 173/65  Pulse:  72 75 85  Resp:  18 18   Temp:  98.4 F (36.9 C)    TempSrc:  Oral    SpO2:  97% 98% 97%  Weight: 86.2 kg     Height:  5\' 5"  (1.651 m)      Constitutional: appears age-appropriate, NAD, calm, comfortable Eyes: PERRL, lids and conjunctivae normal ENMT: Mucous membranes are moist. Posterior pharynx clear of any exudate or lesions. Age-appropriate dentition. Hearing appropriate Neck: normal, supple, no masses, no thyromegaly Respiratory: clear to auscultation bilaterally, no wheezing, no crackles. Normal respiratory effort. No accessory muscle use.  Cardiovascular: Regular rate and rhythm, no murmurs / rubs / gallops. No extremity edema. 2+ pedal pulses. No carotid bruits.  Abdomen: Obese abdomen, no tenderness, no masses palpated, no hepatosplenomegaly. Bowel sounds positive.  Musculoskeletal: no clubbing / cyanosis. No joint deformity upper and lower extremities. Good ROM, no contractures, no atrophy. Normal muscle tone.  Skin: + wounds   Neurologic: Sensation intact. Strength 5/5 in all 4.  Psychiatric: Normal judgment and insight. Alert and oriented x 3. Normal mood.   EKG: Not indicated at this time  X-ray on Admission: I personally reviewed and I agree with radiologist reading as below.  DG Foot Complete Left  Result Date: 04/10/2021 CLINICAL DATA:  Rule out osteomyelitis. EXAM: LEFT FOOT - COMPLETE 3+ VIEW COMPARISON:  None. FINDINGS: Comminuted fracture involving the second proximal phalanx, both base and shaft. Extensive regional soft tissue gas tracking along the plantar forefoot. No adjacent erosion. Generalized osteopenia. These results will be called to the ordering clinician or representative by the Radiologist Assistant, and communication documented in the PACS or [employer]. IMPRESSION: 1. Comminuted and displaced second proximal phalanx fracture. 2. Soft tissue emphysema involving the digits and forefoot around the fracture, history suggesting necrotizing soft tissue infection. Electronically Signed   By: Jorje Guild M.D.   On: 04/10/2021 11:52    Labs on Admission: I have personally  reviewed following labs  CBC: Recent Labs  Lab 04/10/21 1357  WBC 26.1*  NEUTROABS 21.5*  HGB 11.3*  HCT 35.6*  MCV 87.9  PLT 829   Basic Metabolic Panel: Recent Labs  Lab 04/10/21 1357  NA 138  K 4.1  CL 102  CO2 26  GLUCOSE 213*  BUN 40*  CREATININE 2.70*  CALCIUM 9.0   GFR: Estimated Creatinine Clearance: 23.7 mL/min (A) (by C-G formula based on SCr of 2.7 mg/dL (H)).  Liver Function Tests: Recent Labs  Lab 04/10/21 1357  AST 22  ALT 20  ALKPHOS 108  BILITOT 1.1  PROT 7.5  ALBUMIN 2.8*   Coagulation Profile: Recent Labs  Lab 04/10/21 1357  INR 1.3*   Dr. Tobie Poet [facility]  If 7PM-7AM, please contact overnight-coverage provider If 7AM-7PM, please contact day coverage provider [email or web address]  04/10/2021, 7:55 PM

## 2021-04-10 NOTE — Brief Op Note (Signed)
04/10/2021  8:55 PM  PATIENT:  Chelsea Barton  60 y.o. female  PRE-OPERATIVE DIAGNOSIS:  gangrene  POST-OPERATIVE DIAGNOSIS:  gangrene  PROCEDURE:  Procedure(s): IRRIGATION AND DEBRIDEMENT WOUND (Left)  SURGEON:  Surgeon(s) and Role:    Edrick Kins, DPM - Primary  PHYSICIAN ASSISTANT:   ASSISTANTS: none   ANESTHESIA:   local and MAC  EBL: minimal  BLOOD ADMINISTERED:none  DRAINS: none   LOCAL MEDICATIONS USED:  MARCAINE    and LIDOCAINE   SPECIMEN:  Source of Specimen:  LT foot  DISPOSITION OF SPECIMEN:  PATHOLOGY  COUNTS:  YES  TOURNIQUET:  * Missing tourniquet times found for documented tourniquets in log: [id number] *  DICTATION: .Dragon Dictation  PLAN OF CARE: Admit to inpatient   PATIENT DISPOSITION:  PACU - hemodynamically stable.   Delay start of Pharmacological VTE agent (>24hrs) due to surgical blood loss or risk of bleeding: no

## 2021-04-10 NOTE — Consult Note (Signed)
PODIATRY CONSULTATION  NAME Chelsea Barton MRN [id number] DOB 09-09-1960 DOA 04/10/2021   Reason for consult: Necrotizing fasciitis LT foot Chief Complaint  Patient presents with   Foot Pain    Consulting physician: Dr. Hulan Saas  History of present illness: 60 y.o. female PMHx diabetes mellitus, uncontrolled, stage IV CKD, presented to the ED for worsening infection to the left foot.  Patient is unaware how long she has had the infection for.  She denies any trauma to the area.  She is known to podiatry, our practice, for routine foot care.  She was last seen in the office on 01/18/2021. Patient states that she went to an urgent care today for blistering and swelling of the foot and x-rays were taken which were positive for gas embedded within the tissues.  She was sent to the emergency department and admitted and podiatry was contacted and consulted.   After discussing the patient's case with the ED physician she was placed n.p.o. and prepared for emergent surgery this evening due to the worsening infection of gas gangrene and necrotizing fasciitis of the foot.  She presents this evening in the preoperative holding area in preparation for surgical incision and drainage.  Past Medical History:  Diagnosis Date   Diabetes mellitus    Hyperlipidemia    Hypertension     CBC Latest Ref Rng & Units 04/10/2021 06/22/2020 11/14/2017  WBC 4.0 - 10.5 K/uL 26.1(H) 8.7 7.9  Hemoglobin 12.0 - 15.0 g/dL 11.3(L) 13.2 11.9(L)  Hematocrit 36.0 - 46.0 % 35.6(L) 41.2 36.3  Platelets 150 - 400 K/uL 243 216.0 173    BMP Latest Ref Rng & Units 04/10/2021 06/22/2020 11/14/2017  Glucose 70 - 99 mg/dL 213(H) 212(H) 105(H)  BUN 8 - 23 mg/dL 40(H) 31(H) 19  Creatinine 0.44 - 1.00 mg/dL 2.70(H) 2.56(H) 1.27(H)  Sodium 135 - 145 mmol/L 138 140 140  Potassium 3.5 - 5.1 mmol/L 4.1 5.1 3.7  Chloride 98 - 111 mmol/L 102 103 110  CO2 22 - 32 mmol/L 26 32 22  Calcium 8.9 - 10.3 mg/dL 9.0 9.6 8.4(L)          Physical Exam: General: The patient is alert and oriented x3 in no acute distress.   Dermatology: Open draining wound to the first interdigital webspace of the left foot.  Malodor noted.  Heavy purulent sanguinous drainage noted.  Vascular: Edema with erythema noted left foot.  No recent lower extremity vascular status on file.  Neurological: Light sensation absent.  Musculoskeletal Exam: No structural deformity noted.  XR foot LT 04/10/2021: IMPRESSION: 1. Comminuted and displaced second proximal phalanx fracture. 2. Soft tissue emphysema involving the digits and forefoot around the fracture, history suggesting necrotizing soft tissue infection.  ASSESSMENT/PLAN OF CARE 1.  Diabetic necrotizing soft tissue infection LT foot -Today I explained to the patient the severity associated with her foot infection.  Scheduled to go to the emergency department tonight for emergent incision and drainage and irrigation with debridement of the foot. -Explained to the patient that she will most likely return to the OR later this week for amputation of some sort depending on findings and progression over the next 24-48 hours.  Patient understands. -Arterial ultrasound ordered of the lower extremities to determine vascular status -MRI ordered left foot -Continue IV antibiotics as per admitting physician -Podiatry will continue to follow    Thank you for the consult.  Please contact me directly with any questions or concerns.  Cell [phone]   Ruby Cola  Carver Fila, DPM [facility]  Dr. Edrick Kins, DPM    [address], [place] [address]                Office [phone]  Fax [phone]

## 2021-04-10 NOTE — Consult Note (Signed)
Pharmacy Antibiotic Note  Chelsea Barton is a 60 y.o. female admitted on 04/10/2021 with  necrotizing soft tissue infection .  Pharmacy has been consulted for Vancomycin and Zosyn dosing.  Plan: Zosyn 3.375g IV q8h (4 hour infusion). After discussion with Dr. Tobie Poet, antibiotics will be changed form Vancomycin + Clindamycin to Linezolid 600 mg Q12H.  Height: 5\' 5"  (165.1 cm) Weight: 86.2 kg (190 lb) IBW/kg (Calculated) : 57  Temp (24hrs), Avg:98.4 F (36.9 C), Min:98.4 F (36.9 C), Max:98.4 F (36.9 C)  Recent Labs  Lab 04/10/21 1357  WBC 26.1*  CREATININE 2.70*  LATICACIDVEN 1.9    Estimated Creatinine Clearance: 23.7 mL/min (A) (by C-G formula based on SCr of 2.7 mg/dL (H)).    No Known Allergies  Antimicrobials this admission: 10/25 Linezolid >>  10/25 Zosyn >>   Dose adjustments this admission: N/a  Microbiology results: 10/25 BCx: sent 10/25 UCx: sent    Thank you for allowing pharmacy to be a part of this patient's care.  Dorothe Pea, PharmD, BCPS Clinical Pharmacist   04/10/2021 3:42 PM

## 2021-04-10 NOTE — ED Notes (Signed)
See triage note  presents with redness and swelling to left foot  mainly to 1st and 2 nd toes     blister started draining today  was seen at [facility]

## 2021-04-10 NOTE — ED Notes (Signed)
Patient transferred to OR. All belongings in bag went with patient.

## 2021-04-10 NOTE — Progress Notes (Signed)
CBG 210 

## 2021-04-10 NOTE — ED Provider Notes (Signed)
Chelsea Barton   MRN: [id number] DOB: 1961/03/29  Subjective:   Chelsea Barton is a 60 y.o. female with PMH of type 1 diabetes, diabetic neuropathy, CKD presenting for 2-day history of swelling, blister of her left foot.  Denies any particular trauma, pain, bleeding.  She came in today because she noticed that it was draining very slightly and noticed an area that looked like a blister.  No current facility-administered medications for this encounter.  Current Outpatient Medications:    aspirin 81 MG EC tablet, Take 1 tablet (81 mg total) by mouth daily. Swallow whole., Disp: 30 tablet, Rfl: 12   carvedilol (COREG) 12.5 MG tablet, Take 1 tablet (12.5 mg total) by mouth 2 (two) times daily with a meal. Take one tablet by mouth daily with a meal., Disp: 180 tablet, Rfl: 1   Continuous Blood Gluc Sensor (DEXCOM G6 SENSOR) MISC, Place a new sensor every 10 days. Use to check blood sugar at least 4 times daily, Disp: 9 each, Rfl: 3   Continuous Blood Gluc Transmit (DEXCOM G6 TRANSMITTER) MISC, Use to check glucose at least 4 times daily, Disp: 1 each, Rfl: 3   dapagliflozin propanediol (FARXIGA) 10 MG TABS tablet, Take 10 mg by mouth daily., Disp: , Rfl:    digoxin (LANOXIN) 0.125 MG tablet, Take 1 tablet (125 mcg total) by mouth daily., Disp: 90 tablet, Rfl: 1   enalapril (VASOTEC) 20 MG tablet, Take 2 tablets (40 mg total) by mouth daily., Disp: 180 tablet, Rfl: 3   hydrALAZINE (APRESOLINE) 10 MG tablet, TAKE 1/2 TABLET BY MOUTH THREE TIMES DAILY AS NEEDED(TAKE IF BLOOD PRESSURE IS GREATER THAN 140/90), Disp: 30 tablet, Rfl: 1   insulin glargine (LANTUS) 100 UNIT/ML Solostar Pen, Inject 40 Units into the skin daily. , Disp: , Rfl:    Insulin Pen Needle (B-D ULTRAFINE III SHORT PEN) 31G X 8 MM MISC, USE AS DIRECTED, Disp: 100 each, Rfl: 6   Multiple Vitamin (MULTIVITAMIN) capsule, Take 1 capsule by mouth daily., Disp: , Rfl:    NIFEdipine (PROCARDIA-XL/NIFEDICAL-XL) 30 MG 24 hr tablet,  Take 30 mg by mouth daily., Disp: , Rfl:    NOVOLOG FLEXPEN 100 UNIT/ML FlexPen, Inject 25 Units into the skin 3 (three) times daily with meals. Take 5 units at breakfast, 10 units at lunch and 10 units at dinner., Disp: , Rfl:    rosuvastatin (CRESTOR) 10 MG tablet, Take 1 tablet (10 mg total) by mouth daily., Disp: 90 tablet, Rfl: 3   Vitamin D, Cholecalciferol, 10 MCG (400 UNIT) CAPS, Take 1 capsule by mouth daily., Disp: , Rfl:    No Known Allergies  Past Medical History:  Diagnosis Date   Diabetes mellitus    Hyperlipidemia    Hypertension      Past Surgical History:  Procedure Laterality Date   CATARACT EXTRACTION     eye surgery r Right    lense placed    Family History  Problem Relation Age of Onset   Diabetes Sister        43/5 sisters have DM   Breast cancer Neg Hx    Colon cancer Neg Hx     Social History   Tobacco Use   Smoking status: Former    Types: Cigarettes    Quit date: 06/18/2005    Years since quitting: 15.8   Smokeless tobacco: Never  Vaping Use   Vaping Use: Never used  Substance Use Topics   Alcohol use: No   Drug use: No  ROS   Objective:   Vitals: BP (!) 121/54 (BP Location: Left Wrist)   Pulse 75   Temp 98.4 F (36.9 C) (Oral)   SpO2 94%   Physical Exam Constitutional:      General: She is not in acute distress.    Appearance: Normal appearance. She is well-developed. She is not ill-appearing, toxic-appearing or diaphoretic.  HENT:     Head: Normocephalic and atraumatic.     Nose: Nose normal.     Mouth/Throat:     Mouth: Mucous membranes are moist.     Pharynx: Oropharynx is clear.  Eyes:     General: No scleral icterus.    Extraocular Movements: Extraocular movements intact.     Pupils: Pupils are equal, round, and reactive to light.  Cardiovascular:     Rate and Rhythm: Normal rate and regular rhythm.     Pulses: Normal pulses.     Heart sounds: Normal heart sounds. No murmur heard.   No friction rub. No gallop.   Pulmonary:     Effort: Pulmonary effort is normal. No respiratory distress.     Breath sounds: Normal breath sounds. No stridor. No wheezing, rhonchi or rales.  Musculoskeletal:       Legs:  Skin:    General: Skin is warm and dry.     Findings: No rash.  Neurological:     General: No focal deficit present.     Mental Status: She is alert and oriented to person, place, and time.  Psychiatric:        Mood and Affect: Mood normal.        Behavior: Behavior normal.        Thought Content: Thought content normal.        DG Foot Complete Left  Result Date: 04/10/2021 CLINICAL DATA:  Rule out osteomyelitis. EXAM: LEFT FOOT - COMPLETE 3+ VIEW COMPARISON:  None. FINDINGS: Comminuted fracture involving the second proximal phalanx, both base and shaft. Extensive regional soft tissue gas tracking along the plantar forefoot. No adjacent erosion. Generalized osteopenia. These results will be called to the ordering clinician or representative by the Radiologist Assistant, and communication documented in the PACS or [employer]. IMPRESSION: 1. Comminuted and displaced second proximal phalanx fracture. 2. Soft tissue emphysema involving the digits and forefoot around the fracture, history suggesting necrotizing soft tissue infection. Electronically Signed   By: Jorje Guild M.D.   On: 04/10/2021 11:52     Assessment and Plan :   PDMP not reviewed this encounter.  1. Acute osteomyelitis of left foot ([place])   2. Type 1 diabetes mellitus with diabetic chronic kidney disease, unspecified CKD stage ([place])   3. Other fracture of left lesser toe(s), initial encounter for open fracture    Patient will be redirected to the emergency room now for osteomyelitis, necrotizing soft tissue infection, further management of a comminuted, displaced second proximal phalanx fracture of the second left toe.    [name], PA-C 04/10/21 1159

## 2021-04-10 NOTE — Transfer of Care (Signed)
Immediate Anesthesia Transfer of Care Note  Patient: Chelsea Barton  Procedure(s) Performed: IRRIGATION AND DEBRIDEMENT WOUND (Left: Foot) AMPUTATION 2nd TOE, left foot (Toe)  Patient Location: PACU  Anesthesia Type:MAC  Level of Consciousness: awake, alert , oriented and patient cooperative  Airway & Oxygen Therapy: Patient Spontanous Breathing  Post-op Assessment: Report given to RN and Post -op Vital signs reviewed and stable  Post vital signs: Reviewed and stable  Last Vitals:  Vitals Value Taken Time  BP    Temp    Pulse    Resp    SpO2      Last Pain:  Vitals:   04/10/21 1835  TempSrc:   PainSc: 0-No pain         Complications: No notable events documented.

## 2021-04-10 NOTE — ED Triage Notes (Addendum)
Pt present with blister on her left big toe and swelling x 2 days

## 2021-04-10 NOTE — ED Triage Notes (Signed)
Pt here with a blister and pain to her first 2 toes on her left foot. Pt has redness and swelling to the toes. Pt still able to move her toes with no issues. Pt in NAD in triage.

## 2021-04-10 NOTE — ED Provider Notes (Addendum)
[facility] Emergency Department Provider Note  ____________________________________________   Event Date/Time   First MD Initiated Contact with Patient 04/10/21 1335     (approximate)  I have reviewed the triage vital signs and the nursing notes.   HISTORY  Chief Complaint Foot Pain   HPI Chelsea Barton is a 60 y.o. female with a past medical history of DM, HTN, peripheral neuropathy, CKD and obesity who presents for assessment of some 3 days of some redness and swelling on the first and second left toes with blister she noticed today.  She does not recall any injuries or falls.  She denies any significant associated pain.  She denies any fevers, chills, headache, earache, sore throat, vomiting, diarrhea, burning with urination or any others of redness pain or swelling.  No prior similar episodes.  No other acute concerns at this time.         Past Medical History:  Diagnosis Date   Diabetes mellitus    Hyperlipidemia    Hypertension     Patient Active Problem List   Diagnosis Date Noted   Aortic atherosclerosis ([place]) 06/22/2020   Benign hypertensive kidney disease with chronic kidney disease 08/17/2019   Proteinuria 08/17/2019   Secondary hyperparathyroidism of renal origin ([place]) 08/17/2019   Proliferative diabetic retinopathy associated with type 1 diabetes mellitus ([place]) 03/25/2019   Esophageal thickening 03/18/2019   Pain due to onychomycosis of toenails of both feet 12/14/2018   Diabetic neuropathy ([place]) 12/14/2018   Long-term insulin use ([place]) 08/03/2018   CKD (chronic kidney disease) 11/20/2016   CAD (coronary artery disease) 11/20/2016   Mitral valve disease 11/20/2016   Impaired vision 11/20/2016   Moderate mitral insufficiency 03/06/2015   DM type 1 with diabetic peripheral neuropathy ([place]) 03/01/2015   Uncontrolled type 1 diabetes mellitus with hyperglycemia, with long-term current use of insulin ([place]) 03/01/2015   Benign  essential hypertension 10/03/2014   Routine general medical examination at a health care facility 12/14/2012   Obesity 12/14/2012   Hypertension 04/01/2011   Hyperlipidemia 04/01/2011   DM type 1 (diabetes mellitus, type 1) ([place]) 04/01/2011    Past Surgical History:  Procedure Laterality Date   CATARACT EXTRACTION     eye surgery r Right    lense placed    Prior to Admission medications   Medication Sig Start Date End Date Taking? Authorizing Provider  aspirin 81 MG EC tablet Take 1 tablet (81 mg total) by mouth daily. Swallow whole. 02/29/20   Burnard Hawthorne, FNP  carvedilol (COREG) 12.5 MG tablet Take 1 tablet (12.5 mg total) by mouth 2 (two) times daily with a meal. Take one tablet by mouth daily with a meal. 02/29/20   Arnett, Yvetta Coder, FNP  Continuous Blood Gluc Sensor (DEXCOM G6 SENSOR) MISC Place a new sensor every 10 days. Use to check blood sugar at least 4 times daily 02/08/20   Burnard Hawthorne, FNP  Continuous Blood Gluc Transmit (DEXCOM G6 TRANSMITTER) MISC Use to check glucose at least 4 times daily 02/08/20   Burnard Hawthorne, FNP  dapagliflozin propanediol (FARXIGA) 10 MG TABS tablet Take 10 mg by mouth daily.    [provider]  digoxin (LANOXIN) 0.125 MG tablet Take 1 tablet (125 mcg total) by mouth daily. 02/29/20   Burnard Hawthorne, FNP  enalapril (VASOTEC) 20 MG tablet Take 2 tablets (40 mg total) by mouth daily. 02/29/20   Burnard Hawthorne, FNP  hydrALAZINE (APRESOLINE) 10 MG tablet TAKE 1/2  TABLET BY MOUTH THREE TIMES DAILY AS NEEDED(TAKE IF BLOOD PRESSURE IS GREATER THAN 140/90) 03/13/21   Burnard Hawthorne, FNP  insulin glargine (LANTUS) 100 UNIT/ML Solostar Pen Inject 40 Units into the skin daily.  07/20/18   [provider]  Insulin Pen Needle (B-D ULTRAFINE III SHORT PEN) 31G X 8 MM MISC USE AS DIRECTED 06/25/19   Burnard Hawthorne, FNP  Multiple Vitamin (MULTIVITAMIN) capsule Take 1 capsule by mouth daily.    [provider]   NIFEdipine (PROCARDIA-XL/NIFEDICAL-XL) 30 MG 24 hr tablet Take 30 mg by mouth daily. 06/26/20 06/26/21  [provider]  NOVOLOG FLEXPEN 100 UNIT/ML FlexPen Inject 25 Units into the skin 3 (three) times daily with meals. Take 5 units at breakfast, 10 units at lunch and 10 units at dinner. 07/18/18   [provider]  rosuvastatin (CRESTOR) 10 MG tablet Take 1 tablet (10 mg total) by mouth daily. 02/26/21   Burnard Hawthorne, FNP  Vitamin D, Cholecalciferol, 10 MCG (400 UNIT) CAPS Take 1 capsule by mouth daily.    [provider]    Allergies Patient has no known allergies.  Family History  Problem Relation Age of Onset   Diabetes Sister        52/5 sisters have DM   Breast cancer Neg Hx    Colon cancer Neg Hx     Social History Social History   Tobacco Use   Smoking status: Former    Types: Cigarettes    Quit date: 06/18/2005    Years since quitting: 15.8   Smokeless tobacco: Never  Vaping Use   Vaping Use: Never used  Substance Use Topics   Alcohol use: No   Drug use: No    Review of Systems  Review of Systems  Constitutional:  Negative for chills and fever.  HENT:  Negative for sore throat.   Eyes:  Negative for pain.  Respiratory:  Negative for cough and stridor.   Cardiovascular:  Negative for chest pain.  Gastrointestinal:  Negative for vomiting.  Genitourinary:  Negative for dysuria.  Skin:  Negative for rash.  Neurological:  Negative for seizures, loss of consciousness and headaches.  Psychiatric/Behavioral:  Negative for suicidal ideas.   All other systems reviewed and are negative.    ____________________________________________   PHYSICAL EXAM:  VITAL SIGNS: ED Triage Vitals  Enc Vitals Group     BP 04/10/21 1244 (!) 140/55     Pulse Rate 04/10/21 1244 72     Resp 04/10/21 1244 18     Temp 04/10/21 1244 98.4 F (36.9 C)     Temp Source 04/10/21 1244 Oral     SpO2 04/10/21 1244 97 %     Weight 04/10/21 1242 190 lb (86.2 kg)      Height 04/10/21 1242 5\' 5"  (1.651 m)     Head Circumference --      Peak Flow --      Pain Score 04/10/21 1242 0     Pain Loc --      Pain Edu? --      Excl. in [place]? --    Vitals:   04/10/21 1244  BP: (!) 140/55  Pulse: 72  Resp: 18  Temp: 98.4 F (36.9 C)  SpO2: 97%   Physical Exam Vitals and nursing note reviewed.  Constitutional:      General: She is not in acute distress.    Appearance: She is well-developed.  HENT:     Head: Normocephalic and  atraumatic.     Right Ear: External ear normal.     Left Ear: External ear normal.     Nose: Nose normal.  Eyes:     Conjunctiva/sclera: Conjunctivae normal.  Cardiovascular:     Rate and Rhythm: Normal rate and regular rhythm.     Heart sounds: No murmur heard. Pulmonary:     Effort: Pulmonary effort is normal. No respiratory distress.     Breath sounds: Normal breath sounds.  Abdominal:     Palpations: Abdomen is soft.     Tenderness: There is no abdominal tenderness.  Musculoskeletal:     Cervical back: Neck supple.     Right lower leg: No edema.     Left lower leg: Edema present.  Skin:    General: Skin is warm and dry.  Neurological:     Mental Status: She is alert.  Psychiatric:        Mood and Affect: Mood normal.    Photo from earlier urgent care visit included below.  There is evidence of erythema fluctuance and blistering with air and possibly blood and pus visible.  Patient is able to flex and extend the first and second toes.  Remainder of foot is erythematous warm and edematous compared to the right.    ____________________________________________   LABS (all labs ordered are listed, but only abnormal results are displayed)  Labs Reviewed  COMPREHENSIVE METABOLIC PANEL - Abnormal; Notable for the following components:      Result Value   Glucose, Bld 213 (*)    BUN 40 (*)    Creatinine, Ser 2.70 (*)    Albumin 2.8 (*)    GFR, Estimated 19 (*)    All other components within normal limits   CBC WITH DIFFERENTIAL/PLATELET - Abnormal; Notable for the following components:   WBC 26.1 (*)    Hemoglobin 11.3 (*)    HCT 35.6 (*)    Neutro Abs 21.5 (*)    Monocytes Absolute 2.3 (*)    Abs Immature Granulocytes 1.04 (*)    All other components within normal limits  PROTIME-INR - Abnormal; Notable for the following components:   Prothrombin Time 16.0 (*)    INR 1.3 (*)    All other components within normal limits  CULTURE, BLOOD (SINGLE)  RESP PANEL BY RT-PCR (FLU A&B, COVID) ARPGX2  LACTIC ACID, PLASMA  APTT  LACTIC ACID, PLASMA  HEMOGLOBIN A1C   ____________________________________________  EKG  ____________________________________________  RADIOLOGY  ED MD interpretation: Plain film of the left foot remarkable for comminuted displaced fractures of the second proximal phalanx and some soft tissue edema concerning for necrotizing infection.  Official radiology report(s): DG Foot Complete Left  Result Date: 04/10/2021 CLINICAL DATA:  Rule out osteomyelitis. EXAM: LEFT FOOT - COMPLETE 3+ VIEW COMPARISON:  None. FINDINGS: Comminuted fracture involving the second proximal phalanx, both base and shaft. Extensive regional soft tissue gas tracking along the plantar forefoot. No adjacent erosion. Generalized osteopenia. These results will be called to the ordering clinician or representative by the Radiologist Assistant, and communication documented in the PACS or [employer]. IMPRESSION: 1. Comminuted and displaced second proximal phalanx fracture. 2. Soft tissue emphysema involving the digits and forefoot around the fracture, history suggesting necrotizing soft tissue infection. Electronically Signed   By: Jorje Guild M.D.   On: 04/10/2021 11:52    ____________________________________________   PROCEDURES  Procedure(s) performed (including Critical Care):  Procedures   ____________________________________________   INITIAL IMPRESSION / ASSESSMENT AND PLAN /  ED COURSE      Patient presents with above-stated history exam after being referred from urgent care with concerns for possible necrotizing infection in the left first and second toes.  Patient does not recall any preceding injuries or falls.  She does not endorse any pain.  She denies any constitutional symptoms.  She states she noticed the blistering starting today.  She did get an x-ray at urgent care that showed fracture of the second proximal phalanx and findings of edema and overall concern for necrotizing fasciitis.  On exam she has findings that would be highly suggestive of this and is certainly possible she is developed osteomyelitis and surrounding cellulitis as well.  Low suspicion for DVT at this time.  She is denying any other acute sick symptoms I have a low suspicion for other immediate life-threatening process.  Given concern for necrotizing fasciitis I immediately ordered broad-spectrum antibiotics and consulted podiatry communicating that I thought patient had necrotizing fasciitis and needed to go to the OR soon as possible for debridement.  I discussed with Dr. Amalia Hailey who agrees with this plan and recommended hospitalist admission in the meantime.   Will defer large-volume fluid resuscitation at this time given patient appears euvolemic and otherwise does not appear septic.  CBC shows WBC count of 26.1, hemoglobin of 11.3 and unremarkable platelets.  CMP remarkable for glucose of 213, BUN of 40 and a creatinine of 2.7 compared to 2.569 months ago.  No other acute electrolyte or metabolic derangements.  INR and PTT are unremarkable.  Blood culture obtained.  Lactic acid 1.9.  At this time I do not believe patient is septic as she has no fever, tachycardia, hypotension, lactic acidosis or other constitutional symptoms.  However will cover with broad-spectrum antibiotics given concern for above-noted necrotizing infection and plan to admit to hospital service with plan for debridement  later today.       ____________________________________________   FINAL CLINICAL IMPRESSION(S) / ED DIAGNOSES  Final diagnoses:  Necrotizing cellulitis  Closed nondisplaced fracture of phalanx of lesser toe of left foot, unspecified phalanx, initial encounter  Hyperglycemia  Chronic kidney disease, unspecified CKD stage    Medications  piperacillin-tazobactam (ZOSYN) IVPB 3.375 g (3.375 g Intravenous New Bag/Given 04/10/21 1448)  vancomycin (VANCOREADY) IVPB 2000 mg/400 mL (has no administration in time range)  clindamycin (CLEOCIN) IVPB 900 mg (has no administration in time range)  lactated ringers bolus 1,000 mL (has no administration in time range)  insulin aspart (novoLOG) injection 0-15 Units (has no administration in time range)     ED Discharge Orders     None        Note:  This document was prepared using Dragon voice recognition software and may include unintentional dictation errors.    Lucrezia Starch, MD 04/10/21 1448    Lucrezia Starch, MD 04/10/21 [phone]

## 2021-04-10 NOTE — Progress Notes (Signed)
Patient awake/alert x4. Sandwich and coffee given to patient:  100% intake. Left foot  xray done. Patient without c/o's , no pain.

## 2021-04-10 NOTE — Anesthesia Preprocedure Evaluation (Signed)
Anesthesia Evaluation  Patient identified by MRN, date of birth, ID band Patient awake    Reviewed: Allergy & Precautions, H&P , NPO status , Patient's Chart, lab work & pertinent test results, reviewed documented beta blocker date and time   History of Anesthesia Complications Negative for: history of anesthetic complications  Airway Mallampati: III  TM Distance: >3 FB Neck ROM: full    Dental  (+) Dental Advidsory Given, Edentulous Upper, Edentulous Lower   Pulmonary neg pulmonary ROS, former smoker,    Pulmonary exam normal breath sounds clear to auscultation       Cardiovascular Exercise Tolerance: Good hypertension, (-) angina+ CAD  (-) Past MI and (-) Cardiac Stents Normal cardiovascular exam(-) dysrhythmias (-) Valvular Problems/Murmurs Rhythm:regular Rate:Normal     Neuro/Psych negative neurological ROS  negative psych ROS   GI/Hepatic negative GI ROS, Neg liver ROS,   Endo/Other  diabetes, Poorly Controlled, Insulin Dependent  Renal/GU CRFRenal disease  negative genitourinary   Musculoskeletal   Abdominal   Peds  Hematology negative hematology ROS (+)   Anesthesia Other Findings Past Medical History: No date: Diabetes mellitus No date: Hyperlipidemia No date: Hypertension   Reproductive/Obstetrics negative OB ROS                             Anesthesia Physical Anesthesia Plan  ASA: 3  Anesthesia Plan: General   Post-op Pain Management:    Induction: Intravenous  PONV Risk Score and Plan: 3 and TIVA and Propofol infusion  Airway Management Planned: Natural Airway and Simple Face Mask  Additional Equipment:   Intra-op Plan:   Post-operative Plan:   Informed Consent: I have reviewed the patients History and Physical, chart, labs and discussed the procedure including the risks, benefits and alternatives for the proposed anesthesia with the patient or authorized  representative who has indicated his/her understanding and acceptance.     Dental Advisory Given  Plan Discussed with: Anesthesiologist, CRNA and Surgeon  Anesthesia Plan Comments:         Anesthesia Quick Evaluation

## 2021-04-11 ENCOUNTER — Observation Stay: Payer: Medicaid Other

## 2021-04-11 ENCOUNTER — Encounter: Payer: Self-pay | Admitting: Podiatry

## 2021-04-11 DIAGNOSIS — Z20822 Contact with and (suspected) exposure to covid-19: Secondary | ICD-10-CM | POA: Diagnosis present

## 2021-04-11 DIAGNOSIS — E669 Obesity, unspecified: Secondary | ICD-10-CM | POA: Diagnosis present

## 2021-04-11 DIAGNOSIS — E11628 Type 2 diabetes mellitus with other skin complications: Secondary | ICD-10-CM | POA: Diagnosis not present

## 2021-04-11 DIAGNOSIS — I70245 Atherosclerosis of native arteries of left leg with ulceration of other part of foot: Secondary | ICD-10-CM | POA: Diagnosis not present

## 2021-04-11 DIAGNOSIS — D631 Anemia in chronic kidney disease: Secondary | ICD-10-CM | POA: Diagnosis present

## 2021-04-11 DIAGNOSIS — L97502 Non-pressure chronic ulcer of other part of unspecified foot with fat layer exposed: Secondary | ICD-10-CM | POA: Diagnosis not present

## 2021-04-11 DIAGNOSIS — I739 Peripheral vascular disease, unspecified: Secondary | ICD-10-CM | POA: Diagnosis not present

## 2021-04-11 DIAGNOSIS — E08621 Diabetes mellitus due to underlying condition with foot ulcer: Secondary | ICD-10-CM | POA: Diagnosis not present

## 2021-04-11 DIAGNOSIS — L97529 Non-pressure chronic ulcer of other part of left foot with unspecified severity: Secondary | ICD-10-CM | POA: Diagnosis not present

## 2021-04-11 DIAGNOSIS — N184 Chronic kidney disease, stage 4 (severe): Secondary | ICD-10-CM | POA: Diagnosis present

## 2021-04-11 DIAGNOSIS — L0390 Cellulitis, unspecified: Secondary | ICD-10-CM | POA: Diagnosis present

## 2021-04-11 DIAGNOSIS — N179 Acute kidney failure, unspecified: Secondary | ICD-10-CM | POA: Diagnosis present

## 2021-04-11 DIAGNOSIS — L03032 Cellulitis of left toe: Secondary | ICD-10-CM | POA: Diagnosis not present

## 2021-04-11 DIAGNOSIS — E1052 Type 1 diabetes mellitus with diabetic peripheral angiopathy with gangrene: Secondary | ICD-10-CM | POA: Diagnosis present

## 2021-04-11 DIAGNOSIS — E10628 Type 1 diabetes mellitus with other skin complications: Secondary | ICD-10-CM | POA: Diagnosis present

## 2021-04-11 DIAGNOSIS — M7989 Other specified soft tissue disorders: Secondary | ICD-10-CM | POA: Diagnosis not present

## 2021-04-11 DIAGNOSIS — I129 Hypertensive chronic kidney disease with stage 1 through stage 4 chronic kidney disease, or unspecified chronic kidney disease: Secondary | ICD-10-CM | POA: Diagnosis not present

## 2021-04-11 DIAGNOSIS — A480 Gas gangrene: Secondary | ICD-10-CM | POA: Diagnosis present

## 2021-04-11 DIAGNOSIS — I2510 Atherosclerotic heart disease of native coronary artery without angina pectoris: Secondary | ICD-10-CM | POA: Diagnosis present

## 2021-04-11 DIAGNOSIS — E1069 Type 1 diabetes mellitus with other specified complication: Secondary | ICD-10-CM | POA: Diagnosis present

## 2021-04-11 DIAGNOSIS — M86172 Other acute osteomyelitis, left ankle and foot: Secondary | ICD-10-CM | POA: Diagnosis present

## 2021-04-11 DIAGNOSIS — E10621 Type 1 diabetes mellitus with foot ulcer: Secondary | ICD-10-CM | POA: Diagnosis present

## 2021-04-11 DIAGNOSIS — Z6831 Body mass index (BMI) 31.0-31.9, adult: Secondary | ICD-10-CM | POA: Diagnosis not present

## 2021-04-11 DIAGNOSIS — I96 Gangrene, not elsewhere classified: Secondary | ICD-10-CM | POA: Diagnosis not present

## 2021-04-11 DIAGNOSIS — I70262 Atherosclerosis of native arteries of extremities with gangrene, left leg: Secondary | ICD-10-CM | POA: Diagnosis present

## 2021-04-11 DIAGNOSIS — E1151 Type 2 diabetes mellitus with diabetic peripheral angiopathy without gangrene: Secondary | ICD-10-CM | POA: Diagnosis not present

## 2021-04-11 DIAGNOSIS — L03116 Cellulitis of left lower limb: Secondary | ICD-10-CM | POA: Diagnosis present

## 2021-04-11 DIAGNOSIS — E1022 Type 1 diabetes mellitus with diabetic chronic kidney disease: Secondary | ICD-10-CM | POA: Diagnosis present

## 2021-04-11 DIAGNOSIS — E1122 Type 2 diabetes mellitus with diabetic chronic kidney disease: Secondary | ICD-10-CM

## 2021-04-11 DIAGNOSIS — I700 Atherosclerosis of aorta: Secondary | ICD-10-CM | POA: Diagnosis present

## 2021-04-11 DIAGNOSIS — E1065 Type 1 diabetes mellitus with hyperglycemia: Secondary | ICD-10-CM | POA: Diagnosis present

## 2021-04-11 DIAGNOSIS — L089 Local infection of the skin and subcutaneous tissue, unspecified: Secondary | ICD-10-CM | POA: Diagnosis not present

## 2021-04-11 DIAGNOSIS — M8610 Other acute osteomyelitis, unspecified site: Secondary | ICD-10-CM | POA: Diagnosis not present

## 2021-04-11 DIAGNOSIS — I130 Hypertensive heart and chronic kidney disease with heart failure and stage 1 through stage 4 chronic kidney disease, or unspecified chronic kidney disease: Secondary | ICD-10-CM | POA: Diagnosis present

## 2021-04-11 DIAGNOSIS — N189 Chronic kidney disease, unspecified: Secondary | ICD-10-CM

## 2021-04-11 DIAGNOSIS — E1042 Type 1 diabetes mellitus with diabetic polyneuropathy: Secondary | ICD-10-CM | POA: Diagnosis present

## 2021-04-11 DIAGNOSIS — I340 Nonrheumatic mitral (valve) insufficiency: Secondary | ICD-10-CM | POA: Diagnosis present

## 2021-04-11 DIAGNOSIS — N2581 Secondary hyperparathyroidism of renal origin: Secondary | ICD-10-CM | POA: Diagnosis present

## 2021-04-11 DIAGNOSIS — E785 Hyperlipidemia, unspecified: Secondary | ICD-10-CM | POA: Diagnosis present

## 2021-04-11 DIAGNOSIS — M726 Necrotizing fasciitis: Secondary | ICD-10-CM | POA: Diagnosis present

## 2021-04-11 DIAGNOSIS — Z89432 Acquired absence of left foot: Secondary | ICD-10-CM | POA: Diagnosis not present

## 2021-04-11 LAB — CBC
HCT: 35.1 % — ABNORMAL LOW (ref 36.0–46.0)
Hemoglobin: 11.2 g/dL — ABNORMAL LOW (ref 12.0–15.0)
MCH: 27.6 pg (ref 26.0–34.0)
MCHC: 31.9 g/dL (ref 30.0–36.0)
MCV: 86.5 fL (ref 80.0–100.0)
Platelets: 247 10*3/uL (ref 150–400)
RBC: 4.06 MIL/uL (ref 3.87–5.11)
RDW: 13.6 % (ref 11.5–15.5)
WBC: 19.7 10*3/uL — ABNORMAL HIGH (ref 4.0–10.5)
nRBC: 0 % (ref 0.0–0.2)

## 2021-04-11 LAB — COMPREHENSIVE METABOLIC PANEL
ALT: 17 U/L (ref 0–44)
AST: 20 U/L (ref 15–41)
Albumin: 2.6 g/dL — ABNORMAL LOW (ref 3.5–5.0)
Alkaline Phosphatase: 103 U/L (ref 38–126)
Anion gap: 4 — ABNORMAL LOW (ref 5–15)
BUN: 36 mg/dL — ABNORMAL HIGH (ref 8–23)
CO2: 26 mmol/L (ref 22–32)
Calcium: 8.4 mg/dL — ABNORMAL LOW (ref 8.9–10.3)
Chloride: 106 mmol/L (ref 98–111)
Creatinine, Ser: 2.62 mg/dL — ABNORMAL HIGH (ref 0.44–1.00)
GFR, Estimated: 20 mL/min — ABNORMAL LOW (ref >60)
Glucose, Bld: 255 mg/dL — ABNORMAL HIGH (ref 70–99)
Potassium: 3.8 mmol/L (ref 3.5–5.1)
Sodium: 136 mmol/L (ref 135–145)
Total Bilirubin: 0.8 mg/dL (ref 0.3–1.2)
Total Protein: 6.8 g/dL (ref 6.5–8.1)

## 2021-04-11 LAB — GLUCOSE, CAPILLARY
Glucose-Capillary: 144 mg/dL — ABNORMAL HIGH (ref 70–99)
Glucose-Capillary: 208 mg/dL — ABNORMAL HIGH (ref 70–99)
Glucose-Capillary: 210 mg/dL — ABNORMAL HIGH (ref 70–99)
Glucose-Capillary: 235 mg/dL — ABNORMAL HIGH (ref 70–99)
Glucose-Capillary: 242 mg/dL — ABNORMAL HIGH (ref 70–99)
Glucose-Capillary: 244 mg/dL — ABNORMAL HIGH (ref 70–99)
Glucose-Capillary: 251 mg/dL — ABNORMAL HIGH (ref 70–99)
Glucose-Capillary: 293 mg/dL — ABNORMAL HIGH (ref 70–99)

## 2021-04-11 LAB — HEMOGLOBIN A1C
Hgb A1c MFr Bld: 7.8 % — ABNORMAL HIGH (ref 4.8–5.6)
Mean Plasma Glucose: 177.16 mg/dL

## 2021-04-11 LAB — HIV ANTIBODY (ROUTINE TESTING W REFLEX): HIV Screen 4th Generation wRfx: NONREACTIVE

## 2021-04-11 MED ORDER — CARVEDILOL 12.5 MG PO TABS
ORAL_TABLET | ORAL | Status: AC
  Administered 2021-04-11: 12.5 mg via ORAL
  Filled 2021-04-11: qty 1

## 2021-04-11 MED ORDER — INSULIN ASPART 100 UNIT/ML IJ SOLN
INTRAMUSCULAR | Status: AC
  Administered 2021-04-11: 5 [IU] via SUBCUTANEOUS
  Filled 2021-04-11: qty 1

## 2021-04-11 MED ORDER — SODIUM CHLORIDE 0.9 % IV SOLN: INTRAVENOUS | Status: AC

## 2021-04-11 MED ORDER — PIPERACILLIN-TAZOBACTAM 3.375 G IVPB
INTRAVENOUS | Status: AC
  Administered 2021-04-11: 3.375 g via INTRAVENOUS
  Filled 2021-04-11: qty 50

## 2021-04-11 MED ORDER — INSULIN ASPART 100 UNIT/ML IJ SOLN
0.0000 [IU] | Freq: Three times a day (TID) | INTRAMUSCULAR | Status: DC
  Administered 2021-04-11: 8 [IU] via SUBCUTANEOUS
  Filled 2021-04-11: qty 1

## 2021-04-11 MED ORDER — OXYCODONE HCL 5 MG PO TABS
5.0000 mg | ORAL_TABLET | ORAL | Status: DC | PRN
Start: 2021-04-11 — End: 2021-04-24

## 2021-04-11 MED ORDER — INSULIN ASPART 100 UNIT/ML IJ SOLN
INTRAMUSCULAR | Status: AC
  Filled 2021-04-11: qty 1

## 2021-04-11 MED ORDER — INSULIN GLARGINE-YFGN 100 UNIT/ML ~~LOC~~ SOLN
20.0000 [IU] | Freq: Every day | SUBCUTANEOUS | Status: DC
  Administered 2021-04-11 – 2021-04-16 (×6): 20 [IU] via SUBCUTANEOUS
  Filled 2021-04-11 (×10): qty 0.2

## 2021-04-11 NOTE — Anesthesia Postprocedure Evaluation (Signed)
Anesthesia Post Note  Patient: Phillipa D Yaklin  Procedure(s) Performed: IRRIGATION AND DEBRIDEMENT WOUND (Left: Foot) AMPUTATION 2nd TOE, left foot (Toe)  Patient location during evaluation: PACU Anesthesia Type: General Level of consciousness: awake and alert Pain management: pain level controlled Vital Signs Assessment: post-procedure vital signs reviewed and stable Respiratory status: spontaneous breathing, nonlabored ventilation, respiratory function stable and patient connected to nasal cannula oxygen Cardiovascular status: blood pressure returned to baseline and stable Postop Assessment: no apparent nausea or vomiting Anesthetic complications: no   No notable events documented.   Last Vitals:  Vitals:   04/10/21 2130 04/11/21 0538  BP: (!) 149/69 (!) 165/59  Pulse: 79 85  Resp: 16 16  Temp: 36.6 C 37.2 C  SpO2: 94%     Last Pain:  Vitals:   04/10/21 2130  TempSrc:   PainSc: 0-No pain                 Martha Clan

## 2021-04-11 NOTE — Progress Notes (Signed)
Patient was sent to MRI for her foot and while she was down there the techs removed her Dexcom continuous monitor. We contacted the [facility] diabetic coordinator to replace the one that was removed. They stated that they do not have that particular kind but we could give her one that is comparable until we could talk with her doctor. Patient has the transmitter and control that states the glucose number with her. We are sending her and her supplies to room 131 and will give a handoff to the nurse. Patient is also having food until midnight due to a TAM tomorrow by Dr. Boneta Lucks.

## 2021-04-11 NOTE — Consult Note (Signed)
@LOGO @   MRN : [id number]  Chelsea Barton is a 60 y.o. ([birth year]) female who presents with chief complaint of infection left foot.  History of Present Illness:  I am asked to evaluate the patient by Dr. Sherryle Lis.  Patient is a 60 year old woman who presented to [facility] on 04/10/2021 with a necrotizing foot infection.  She subsequently underwent debridement later that day.  Examination has raised concerns regarding her circulatory status.  The patient notes the ulcer has been present for approximately 3 days and has not been improving.  It is painful and has had drainage.  No specific history of trauma noted by the patient.  The patient denies fever or chills.  She does have diabetes which has been difficult to control.  Patient notes prior to the ulcer developing she denies her extremities were painful with ambulation or activity and the discomfort is very consistent day today.   The patient denies rest pain or dangling of an extremity off the side of the bed during the night for relief. No prior interventions or surgeries.  The patient denies amaurosis fugax or recent TIA symptoms. There are no recent neurological changes noted. The patient denies history of DVT, PE or superficial thrombophlebitis. The patient denies recent episodes of angina or shortness of breath.    Current Meds  Medication Sig   aspirin 81 MG EC tablet Take 1 tablet (81 mg total) by mouth daily. Swallow whole.   carvedilol (COREG) 12.5 MG tablet Take 1 tablet (12.5 mg total) by mouth 2 (two) times daily with a meal. Take one tablet by mouth daily with a meal.   dapagliflozin propanediol (FARXIGA) 10 MG TABS tablet Take 10 mg by mouth daily.   digoxin (LANOXIN) 0.125 MG tablet Take 1 tablet (125 mcg total) by mouth daily.   enalapril (VASOTEC) 20 MG tablet Take 2 tablets (40 mg total) by mouth daily.   hydrALAZINE (APRESOLINE) 10 MG tablet TAKE 1/2 TABLET BY MOUTH THREE TIMES DAILY AS  NEEDED(TAKE IF BLOOD PRESSURE IS GREATER THAN 140/90)   insulin glargine (LANTUS) 100 UNIT/ML Solostar Pen Inject 40 Units into the skin daily.    Multiple Vitamin (MULTIVITAMIN) capsule Take 1 capsule by mouth daily.   NIFEdipine (PROCARDIA-XL/NIFEDICAL-XL) 30 MG 24 hr tablet Take 30 mg by mouth daily.   NOVOLOG FLEXPEN 100 UNIT/ML FlexPen Inject 25 Units into the skin 3 (three) times daily with meals. Take 5 units at breakfast, 10 units at lunch and 10 units at dinner.   rosuvastatin (CRESTOR) 10 MG tablet Take 1 tablet (10 mg total) by mouth daily.   rosuvastatin (CRESTOR) 10 MG tablet Take 1 tablet by mouth daily.   Vitamin D, Cholecalciferol, 10 MCG (400 UNIT) CAPS Take 1 capsule by mouth daily.    Past Medical History:  Diagnosis Date   Diabetes mellitus    Hyperlipidemia    Hypertension     Past Surgical History:  Procedure Laterality Date   AMPUTATION TOE  04/10/2021   Procedure: AMPUTATION 2nd TOE, left foot;  Surgeon: Edrick Kins, DPM;  Location: [facility] ORS;  Service: Podiatry;;   CATARACT EXTRACTION     eye surgery r Right    lense placed   INCISION AND DRAINAGE OF WOUND Left 04/10/2021   Procedure: IRRIGATION AND DEBRIDEMENT WOUND;  Surgeon: Edrick Kins, DPM;  Location: [facility] ORS;  Service: Podiatry;  Laterality: Left;    Social History Social History   Tobacco Use   Smoking status: Former  Types: Cigarettes    Quit date: 06/18/2005    Years since quitting: 15.8   Smokeless tobacco: Never  Vaping Use   Vaping Use: Never used  Substance Use Topics   Alcohol use: No   Drug use: No    Family History Family History  Problem Relation Age of Onset   Diabetes Sister        3/5 sisters have DM   Breast cancer Neg Hx    Colon cancer Neg Hx     No Known Allergies   REVIEW OF SYSTEMS (Negative unless checked)  Constitutional: [] Weight loss  [] Fever  [] Chills Cardiac: [] Chest pain   [] Chest pressure   [] Palpitations   [] Shortness of breath when laying  flat   [] Shortness of breath with exertion. Vascular:  [] Pain in legs with walking   [] Pain in legs at rest  [] History of DVT   [] Phlebitis   [] Swelling in legs   [] Varicose veins   left foot with wounds non-healing ulcers Pulmonary:   [] Uses home oxygen   [] Productive cough   [] Hemoptysis   [] Wheeze  [] COPD   [] Asthma Neurologic:  [] Dizziness   [] Seizures   [] History of stroke   [] History of TIA  [] Aphasia   [] Vissual changes   [] Weakness or numbness in arm   [] Weakness or numbness in leg Musculoskeletal:   [] Joint swelling   [] Joint pain   [] Low back pain Hematologic:  [] Easy bruising  [] Easy bleeding   [] Hypercoagulable state   [] Anemic Gastrointestinal:  [] Diarrhea   [] Vomiting  [] Gastroesophageal reflux/heartburn   [] Difficulty swallowing. Genitourinary:  [] Chronic kidney disease   [] Difficult urination  [] Frequent urination   [] Blood in urine Skin:  [] Rashes   [x] Ulcers  Psychological:  [] History of anxiety   []  History of major depression.  Physical Examination  Vitals:   04/11/21 1013 04/11/21 1515 04/11/21 1630 04/11/21 1948  BP: (!) 174/64 (!) 154/51 (!) 180/67 (!) 166/65  Pulse:  87 84 80  Resp:  15 18 17   Temp:  99.1 F (37.3 C) 98.4 F (36.9 C) 98.4 F (36.9 C)  TempSrc:      SpO2:  94% 95% 94%  Weight:      Height:       Body mass index is 31.62 kg/m. Gen: WD/WN, NAD Head: [place]/AT, No temporalis wasting.  Ear/Nose/Throat: Hearing grossly intact, nares w/o erythema or drainage Eyes: PER, EOMI, sclera nonicteric.  Neck: Supple, no masses.  No bruit or JVD.  Pulmonary:  Good air movement, no audible wheezing, no use of accessory muscles.  Cardiac: RRR, normal S1, S2, no Murmurs. Vascular: Left foot with wounds dressing is removed Vessel Right Left  Radial Palpable Palpable  PT 2+ Palpable Not palpable  DP 2+ Palpable Not palpable  Gastrointestinal: soft, non-distended. No guarding/no peritoneal signs.  Musculoskeletal: M/S 5/5 throughout.  No visible deformity.   Neurologic: CN 2-12 intact. Pain and light touch intact in extremities.  Symmetrical.  Speech is fluent. Motor exam as listed above. Psychiatric: Judgment intact, Mood & affect appropriate for pt's clinical situation. Dermatologic: No rashes ulcers noted left forefoot.  + changes consistent with cellulitis.   CBC Lab Results  Component Value Date   WBC 19.7 (H) 04/11/2021   HGB 11.2 (L) 04/11/2021   HCT 35.1 (L) 04/11/2021   MCV 86.5 04/11/2021   PLT 247 04/11/2021    BMET    Component Value Date/Time   NA 136 04/11/2021 0245   NA 137 01/20/2014 0000   K 3.8 04/11/2021  0245   CL 106 04/11/2021 0245   CO2 26 04/11/2021 0245   GLUCOSE 255 (H) 04/11/2021 0245   BUN 36 (H) 04/11/2021 0245   BUN 25 (A) 01/20/2014 0000   CREATININE 2.62 (H) 04/11/2021 0245   CALCIUM 8.4 (L) 04/11/2021 0245   GFRNONAA 20 (L) 04/11/2021 0245   GFRAA 53 (L) 11/14/2017 1349   Estimated Creatinine Clearance: 24.5 mL/min (A) (by C-G formula based on SCr of 2.62 mg/dL (H)).  COAG Lab Results  Component Value Date   INR 1.3 (H) 04/10/2021    Radiology MR FOOT LEFT WO CONTRAST  Result Date: 04/11/2021 CLINICAL DATA:  Osteomyelitis suspected, foot swelling, diabetic EXAM: MRI OF THE LEFT FOOT WITHOUT CONTRAST TECHNIQUE: Multiplanar, multisequence MR imaging of the left foot was performed. No intravenous contrast was administered. COMPARISON:  Radiograph dated April 10, 2021 FINDINGS: Bones/Joint/Cartilage Status post amputation of the second digit through the metatarsophalangeal joint. There is hyperintense signal in the second metatarsal head on stir and T2 sequences (series 12, image 21, series 10 image 13). Ligaments Intact Muscles and Tendons Increased intramuscular signal of the plantar muscles concerning for diabetic myopathy/myositis. No drainable fluid collection or abscess. The visualized tendons are intact Soft tissues Marked subcutaneous soft tissue edema prominent about the dorsum of foot  and about the prior amputation site. 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 Hyperintense signal in second metatarsal head, which in the presence of adjacent skin defect is concerning for osteomyelitis. Differential includes reactive marrow edema secondary to postsurgical changes. Marked soft tissue edema about the amputation site and dorsum of the foot. Diabetic myopathy/myositis of the plantar muscles. No drainable fluid collection or abscess. Electronically Signed   By: Keane Police D.O.   On: 04/11/2021 12:27   US ARTERIAL ABI (SCREENING LOWER EXTREMITY)  Result Date: 04/11/2021 CLINICAL DATA:  60 year old female with gangrenous left foot. EXAM: NONINVASIVE PHYSIOLOGIC VASCULAR STUDY OF BILATERAL LOWER EXTREMITIES TECHNIQUE: Evaluation of both lower extremities were performed at rest, including calculation of ankle-brachial indices with single level Doppler, pressure and pulse volume recording. COMPARISON:  None. FINDINGS: Right ABI:  0.90 Left ABI:  0.53 Right Lower Extremity: Biphasic waveforms in the posterior tibial artery, monophasic waveforms in the dorsalis pedis at the level of the ankle. Left Lower Extremity: Monophasic waveforms at the level of the ankle. IMPRESSION: 1. Moderate to severe left lower extremity peripheral artery disease (ABI = 0.53). 2. Mild right lower extremity peripheral artery disease (ABI = 0.90). Ruthann Cancer, MD [facility] Radiology Electronically Signed   By: Ruthann Cancer M.D.   On: 04/11/2021 12:12   DG Foot Complete Left  Result Date: 04/10/2021 CLINICAL DATA:  Postop EXAM: LEFT FOOT - COMPLETE 3+ VIEW COMPARISON:  04/10/2021 FINDINGS: Interval amputation of the second digit at the level of the MTP joint. No acute fracture or malalignment. Resolution of previously noted soft tissue emphysema. IMPRESSION: Interval  amputation of the second digit at the level of MTP joint with expected postsurgical change Electronically Signed   By: Donavan Foil M.D.   On: 04/10/2021 21:38   DG Foot Complete Left  Result Date: 04/10/2021 CLINICAL DATA:  Rule out osteomyelitis. EXAM: LEFT FOOT - COMPLETE 3+ VIEW COMPARISON:  None. FINDINGS: Comminuted fracture involving the second proximal phalanx, both base and shaft. Extensive regional soft tissue gas tracking along the plantar forefoot. No adjacent erosion. Generalized osteopenia. These results will  be called to the ordering clinician or representative by the Radiologist Assistant, and communication documented in the PACS or [employer]. IMPRESSION: 1. Comminuted and displaced second proximal phalanx fracture. 2. Soft tissue emphysema involving the digits and forefoot around the fracture, history suggesting necrotizing soft tissue infection. Electronically Signed   By: Jorje Guild M.D.   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  This represents a limb threatening ischemia and places the patient at the risk for left limb loss.  Patient should undergo angiography of the left lower extremity with the hope for intervention for limb salvage.  The risks and benefits as well as the alternative therapies was discussed in detail with the patient.  All questions were answered.  Patient agrees to proceed with of the left leg angiography.  The patient will follow up with me in the office after the procedure.    2.  Diabetes mellitus: Continue hypoglycemic medications as already ordered, these medications have been reviewed and there are no changes at this time.  Hgb A1C to be monitored as already arranged by primary service.  3.   Chronic renal  insufficiency: We will hydrate the night before and minimize contrast burden.  4.  Hypertension: Continue antihypertensive medications as already ordered, these medications have been reviewed and there are no changes at this time.    [name], MD  04/11/2021 7:58 PM

## 2021-04-11 NOTE — H&P (View-Only) (Signed)
@LOGO @   MRN : [id number]  Chelsea Barton is a 60 y.o. (02/09/1961) female who presents with chief complaint of infection left foot.  History of Present Illness:  I am asked to evaluate the patient by Dr. Sherryle Lis.  Patient is a 60 year old woman who presented to [facility] on 04/10/2021 with a necrotizing foot infection.  She subsequently underwent debridement later that day.  Examination has raised concerns regarding her circulatory status.  The patient notes the ulcer has been present for approximately 3 days and has not been improving.  It is painful and has had drainage.  No specific history of trauma noted by the patient.  The patient denies fever or chills.  She does have diabetes which has been difficult to control.  Patient notes prior to the ulcer developing she denies her extremities were painful with ambulation or activity and the discomfort is very consistent day today.   The patient denies rest pain or dangling of an extremity off the side of the bed during the night for relief. No prior interventions or surgeries.  The patient denies amaurosis fugax or recent TIA symptoms. There are no recent neurological changes noted. The patient denies history of DVT, PE or superficial thrombophlebitis. The patient denies recent episodes of angina or shortness of breath.    Current Meds  Medication Sig   aspirin 81 MG EC tablet Take 1 tablet (81 mg total) by mouth daily. Swallow whole.   carvedilol (COREG) 12.5 MG tablet Take 1 tablet (12.5 mg total) by mouth 2 (two) times daily with a meal. Take one tablet by mouth daily with a meal.   dapagliflozin propanediol (FARXIGA) 10 MG TABS tablet Take 10 mg by mouth daily.   digoxin (LANOXIN) 0.125 MG tablet Take 1 tablet (125 mcg total) by mouth daily.   enalapril (VASOTEC) 20 MG tablet Take 2 tablets (40 mg total) by mouth daily.   hydrALAZINE (APRESOLINE) 10 MG tablet TAKE 1/2 TABLET BY MOUTH THREE TIMES DAILY AS  NEEDED(TAKE IF BLOOD PRESSURE IS GREATER THAN 140/90)   insulin glargine (LANTUS) 100 UNIT/ML Solostar Pen Inject 40 Units into the skin daily.    Multiple Vitamin (MULTIVITAMIN) capsule Take 1 capsule by mouth daily.   NIFEdipine (PROCARDIA-XL/NIFEDICAL-XL) 30 MG 24 hr tablet Take 30 mg by mouth daily.   NOVOLOG FLEXPEN 100 UNIT/ML FlexPen Inject 25 Units into the skin 3 (three) times daily with meals. Take 5 units at breakfast, 10 units at lunch and 10 units at dinner.   rosuvastatin (CRESTOR) 10 MG tablet Take 1 tablet (10 mg total) by mouth daily.   rosuvastatin (CRESTOR) 10 MG tablet Take 1 tablet by mouth daily.   Vitamin D, Cholecalciferol, 10 MCG (400 UNIT) CAPS Take 1 capsule by mouth daily.    Past Medical History:  Diagnosis Date   Diabetes mellitus    Hyperlipidemia    Hypertension     Past Surgical History:  Procedure Laterality Date   AMPUTATION TOE  04/10/2021   Procedure: AMPUTATION 2nd TOE, left foot;  Surgeon: Edrick Kins, DPM;  Location: [facility] ORS;  Service: Podiatry;;   CATARACT EXTRACTION     eye surgery r Right    lense placed   INCISION AND DRAINAGE OF WOUND Left 04/10/2021   Procedure: IRRIGATION AND DEBRIDEMENT WOUND;  Surgeon: Edrick Kins, DPM;  Location: [facility] ORS;  Service: Podiatry;  Laterality: Left;    Social History Social History   Tobacco Use   Smoking status: Former  Types: Cigarettes    Quit date: 06/18/2005    Years since quitting: 15.8   Smokeless tobacco: Never  Vaping Use   Vaping Use: Never used  Substance Use Topics   Alcohol use: No   Drug use: No    Family History Family History  Problem Relation Age of Onset   Diabetes Sister        3/5 sisters have DM   Breast cancer Neg Hx    Colon cancer Neg Hx     No Known Allergies   REVIEW OF SYSTEMS (Negative unless checked)  Constitutional: [] Weight loss  [] Fever  [] Chills Cardiac: [] Chest pain   [] Chest pressure   [] Palpitations   [] Shortness of breath when laying  flat   [] Shortness of breath with exertion. Vascular:  [] Pain in legs with walking   [] Pain in legs at rest  [] History of DVT   [] Phlebitis   [] Swelling in legs   [] Varicose veins   left foot with wounds non-healing ulcers Pulmonary:   [] Uses home oxygen   [] Productive cough   [] Hemoptysis   [] Wheeze  [] COPD   [] Asthma Neurologic:  [] Dizziness   [] Seizures   [] History of stroke   [] History of TIA  [] Aphasia   [] Vissual changes   [] Weakness or numbness in arm   [] Weakness or numbness in leg Musculoskeletal:   [] Joint swelling   [] Joint pain   [] Low back pain Hematologic:  [] Easy bruising  [] Easy bleeding   [] Hypercoagulable state   [] Anemic Gastrointestinal:  [] Diarrhea   [] Vomiting  [] Gastroesophageal reflux/heartburn   [] Difficulty swallowing. Genitourinary:  [] Chronic kidney disease   [] Difficult urination  [] Frequent urination   [] Blood in urine Skin:  [] Rashes   [x] Ulcers  Psychological:  [] History of anxiety   []  History of major depression.  Physical Examination  Vitals:   04/11/21 1013 04/11/21 1515 04/11/21 1630 04/11/21 1948  BP: (!) 174/64 (!) 154/51 (!) 180/67 (!) 166/65  Pulse:  87 84 80  Resp:  15 18 17   Temp:  99.1 F (37.3 C) 98.4 F (36.9 C) 98.4 F (36.9 C)  TempSrc:      SpO2:  94% 95% 94%  Weight:      Height:       Body mass index is 31.62 kg/m. Gen: WD/WN, NAD Head: [place]/AT, No temporalis wasting.  Ear/Nose/Throat: Hearing grossly intact, nares w/o erythema or drainage Eyes: PER, EOMI, sclera nonicteric.  Neck: Supple, no masses.  No bruit or JVD.  Pulmonary:  Good air movement, no audible wheezing, no use of accessory muscles.  Cardiac: RRR, normal S1, S2, no Murmurs. Vascular: Left foot with wounds dressing is removed Vessel Right Left  Radial Palpable Palpable  PT 2+ Palpable Not palpable  DP 2+ Palpable Not palpable  Gastrointestinal: soft, non-distended. No guarding/no peritoneal signs.  Musculoskeletal: M/S 5/5 throughout.  No visible deformity.   Neurologic: CN 2-12 intact. Pain and light touch intact in extremities.  Symmetrical.  Speech is fluent. Motor exam as listed above. Psychiatric: Judgment intact, Mood & affect appropriate for pt's clinical situation. Dermatologic: No rashes ulcers noted left forefoot.  + changes consistent with cellulitis.   CBC Lab Results  Component Value Date   WBC 19.7 (H) 04/11/2021   HGB 11.2 (L) 04/11/2021   HCT 35.1 (L) 04/11/2021   MCV 86.5 04/11/2021   PLT 247 04/11/2021    BMET    Component Value Date/Time   NA 136 04/11/2021 0245   NA 137 01/20/2014 0000   K 3.8 04/11/2021  0245   CL 106 04/11/2021 0245   CO2 26 04/11/2021 0245   GLUCOSE 255 (H) 04/11/2021 0245   BUN 36 (H) 04/11/2021 0245   BUN 25 (A) 01/20/2014 0000   CREATININE 2.62 (H) 04/11/2021 0245   CALCIUM 8.4 (L) 04/11/2021 0245   GFRNONAA 20 (L) 04/11/2021 0245   GFRAA 53 (L) 11/14/2017 1349   Estimated Creatinine Clearance: 24.5 mL/min (A) (by C-G formula based on SCr of 2.62 mg/dL (H)).  COAG Lab Results  Component Value Date   INR 1.3 (H) 04/10/2021    Radiology MR FOOT LEFT WO CONTRAST  Result Date: 04/11/2021 CLINICAL DATA:  Osteomyelitis suspected, foot swelling, diabetic EXAM: MRI OF THE LEFT FOOT WITHOUT CONTRAST TECHNIQUE: Multiplanar, multisequence MR imaging of the left foot was performed. No intravenous contrast was administered. COMPARISON:  Radiograph dated April 10, 2021 FINDINGS: Bones/Joint/Cartilage Status post amputation of the second digit through the metatarsophalangeal joint. There is hyperintense signal in the second metatarsal head on stir and T2 sequences (series 12, image 21, series 10 image 13). Ligaments Intact Muscles and Tendons Increased intramuscular signal of the plantar muscles concerning for diabetic myopathy/myositis. No drainable fluid collection or abscess. The visualized tendons are intact Soft tissues Marked subcutaneous soft tissue edema prominent about the dorsum of foot  and about the prior amputation site. 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 Hyperintense signal in second metatarsal head, which in the presence of adjacent skin defect is concerning for osteomyelitis. Differential includes reactive marrow edema secondary to postsurgical changes. Marked soft tissue edema about the amputation site and dorsum of the foot. Diabetic myopathy/myositis of the plantar muscles. No drainable fluid collection or abscess. Electronically Signed   By: Keane Police D.O.   On: 04/11/2021 12:27   US ARTERIAL ABI (SCREENING LOWER EXTREMITY)  Result Date: 04/11/2021 CLINICAL DATA:  60 year old female with gangrenous left foot. EXAM: NONINVASIVE PHYSIOLOGIC VASCULAR STUDY OF BILATERAL LOWER EXTREMITIES TECHNIQUE: Evaluation of both lower extremities were performed at rest, including calculation of ankle-brachial indices with single level Doppler, pressure and pulse volume recording. COMPARISON:  None. FINDINGS: Right ABI:  0.90 Left ABI:  0.53 Right Lower Extremity: Biphasic waveforms in the posterior tibial artery, monophasic waveforms in the dorsalis pedis at the level of the ankle. Left Lower Extremity: Monophasic waveforms at the level of the ankle. IMPRESSION: 1. Moderate to severe left lower extremity peripheral artery disease (ABI = 0.53). 2. Mild right lower extremity peripheral artery disease (ABI = 0.90). Ruthann Cancer, MD [facility] Radiology Electronically Signed   By: Ruthann Cancer M.D.   On: 04/11/2021 12:12   DG Foot Complete Left  Result Date: 04/10/2021 CLINICAL DATA:  Postop EXAM: LEFT FOOT - COMPLETE 3+ VIEW COMPARISON:  04/10/2021 FINDINGS: Interval amputation of the second digit at the level of the MTP joint. No acute fracture or malalignment. Resolution of previously noted soft tissue emphysema. IMPRESSION: Interval  amputation of the second digit at the level of MTP joint with expected postsurgical change Electronically Signed   By: Donavan Foil M.D.   On: 04/10/2021 21:38   DG Foot Complete Left  Result Date: 04/10/2021 CLINICAL DATA:  Rule out osteomyelitis. EXAM: LEFT FOOT - COMPLETE 3+ VIEW COMPARISON:  None. FINDINGS: Comminuted fracture involving the second proximal phalanx, both base and shaft. Extensive regional soft tissue gas tracking along the plantar forefoot. No adjacent erosion. Generalized osteopenia. These results will  be called to the ordering clinician or representative by the Radiologist Assistant, and communication documented in the PACS or [employer]. IMPRESSION: 1. Comminuted and displaced second proximal phalanx fracture. 2. Soft tissue emphysema involving the digits and forefoot around the fracture, history suggesting necrotizing soft tissue infection. Electronically Signed   By: Jorje Guild M.D.   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  This represents a limb threatening ischemia and places the patient at the risk for left limb loss.  Patient should undergo angiography of the left lower extremity with the hope for intervention for limb salvage.  The risks and benefits as well as the alternative therapies was discussed in detail with the patient.  All questions were answered.  Patient agrees to proceed with of the left leg angiography.  The patient will follow up with me in the office after the procedure.    2.  Diabetes mellitus: Continue hypoglycemic medications as already ordered, these medications have been reviewed and there are no changes at this time.  Hgb A1C to be monitored as already arranged by primary service.  3.   Chronic renal  insufficiency: We will hydrate the night before and minimize contrast burden.  4.  Hypertension: Continue antihypertensive medications as already ordered, these medications have been reviewed and there are no changes at this time.    [name], MD  04/11/2021 7:58 PM

## 2021-04-11 NOTE — Progress Notes (Signed)
PROGRESS NOTE    Chelsea Barton  IWP:[id number] DOB: Feb 23, 1961 DOA: 04/10/2021 PCP: Burnard Hawthorne, FNP    Brief Narrative:  Chelsea Barton is a 60 year old female with past medical history significant for type 2 diabetes mellitus, CKD stage IV, essential hypertension, hyperlipidemia, LVH, mitral insufficiency, secondary hyperparathyroidism, hyperlipidemia who presented to [facility] ED on 10/25 with left foot pain and swelling.  Onset roughly 2 days ago.  Denies any trauma.   In the ED, temperature 98.4 F, HR 72, RR 18, BP 140/55, SPO2 97% on room air.  Sodium 138, potassium 4.1, chloride 102, CO2 26, glucose 213, BUN 40, creatinine 2.70.  Lactic acid 1.9.  WBC 26.1, hemoglobin 11.3, platelets 243.  INR 1.3.  COVID-19 PCR negative.  Fluenz A/B PCR negative.  X-ray left foot with comminuted and displaced second proximal phalanx fracture, soft tissue emphysema involving the digits and forefoot around the fracture suggestive of necrotizing soft tissue infection.  Podiatry was consulted.  Patient was started empiric antibiotics.  TRH consulted for further evaluation and management of left foot diabetic infection.   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 X-ray left foot with comminuted and displaced second proximal phalanx fracture, soft tissue emphysema involving the digits and forefoot around the fracture suggestive of necrotizing soft tissue infection.  Patient  underwent incision and drainage and left second toe amputation by podiatry, Dr. Amalia Hailey on 04/10/2021.  MRI left foot with hyperintense signal Sica metatarsal head with adjacent skin defect concerning for vasculitis, marked soft tissue edema about amputation site, diabetic myopathy/myositis of the plantar muscles without drainable fluid collection/abscess. --Podiatry following, appreciate assistance --WBC 261>19.7 --ESR 72 --CRP 27.8 --Blood cultures x2: no growth < 24 hours --Zyvox 600 mg IV every 12 hours --Zosyn 3.396m IV q8h --Oxycodone 5 mg q4h prn moderate pain --Morphine 2 mg IV q2h PRN severe pain --CBC daily  Peripheral vascular disease Ultrasound arterial ABI 10/26 with moderate to severe left lower extremity peripheral artery disease with ABI 0.53 and mild right lower extremity peripheral artery disease with ABI 0.90. --Vascular surgery consulted for further evaluation  Type 2 diabetes mellitus with hyperglycemia Hemoglobin A1c 7.8 and 04/10/2021, not optimally controlled.  Home regimen includes Lantus 40 units apparently daily, Farxiga 10 mg p.o. daily. --Semglee 20u [place] QHS --NovoLog SSI for further coverage --CBGs qAC/HS  Acute renal failure on CKD stage IV Creatinine elevated 2.70 on admission, baseline 2.3-2.4.  Etiology likely secondary to acute infectious process as above. --Cr 2.70>2.62 --Holding home enalapril --Avoid nephrotoxins, renal dose all medications --BMP daily  Essential hypertension Home regimen includes carvedilol 12.5 mg p.o. twice daily, enalapril 40 mg p.o. daily, digoxin 0.15 mg p.o. daily, nifedipine 30 mg p.o. daily, hydralazine prn. --Carvedilol 12.5 mg p.o. twice daily --Digoxin 0.125 mg p.o. daily --Holding enalapril due to AKI as above.  Hyperlipidemia --check lipid panel in am --continue home Crestor 10 mg p.o. daily  Obesity Body mass index is 31.62 kg/m.  Discussed with patient needs for aggressive lifestyle  changes/weight loss as  this complicates all facets of care.  Outpatient follow-up with PCP.     DVT prophylaxis: SCDs Start: 04/10/21 2138 Place TED hose Start: 04/10/21 1455   Code Status: Full Code Family Communication: No family present at bedside this morning  Disposition Plan:  Level of care: Med-Surg Status is: Observation  The patient will require care spanning > 2 midnights and should be moved to inpatient because: IV antibiotics, further debridement/amputation, vascular surgery consultation with likely need of intervention.   Consultants:  Podiatry, Dr. Amalia Hailey Vascular surgery  Procedures:  Incision and drainage left foot with left second toe amputation, podiatry Dr. Amalia Hailey 10/25  Antimicrobials:  Zyvox 10/25>> Zosyn 10/25>> Vancomycin 10/25 - 10/25 Clindamycin 10/25 - 10/25     Subjective: Patient seen examined bedside, resting comfortably.  Watching TV.  Remains in the preop area.  Went incision and drainage of foot infection yesterday by podiatry.  ABIs abnormal, consulting vascular surgery today.  Also discussed with podiatry, will need further amputation likely tomorrow versus Friday.  Patient denies any current pain.  No other questions or concerns at this time.  Denies headache, no current fever/chills/night sweats, no nausea/vomiting/diarrhea, no chest pain, palpitations, no abdominal pain, no weakness, no fatigue, no paresthesias.  No acute events overnight per nurse staff.  Objective: Vitals:   04/11/21 0538 04/11/21 0800 04/11/21 1008 04/11/21 1013  BP: (!) 165/59 (!) 161/66  (!) 174/64  Pulse: 85 82 84   Resp: 16 16    Temp: 99 F (37.2 C) 97.7 F (36.5 C)    TempSrc:  Temporal    SpO2:  93%    Weight:      Height:        Intake/Output Summary (Last 24 hours) at 04/11/2021 1303 Last data filed at 04/11/2021 0438 Gross per 24 hour  Intake 2565 ml  Output --  Net 2565 ml   Filed Weights   04/10/21 1242  Weight: 86.2 kg    Examination:  General exam: Appears  calm and comfortable, obese Respiratory system: Clear to auscultation. Respiratory effort normal.  On room air Cardiovascular system: S1 & S2 heard, RRR. No JVD, murmurs, rubs, gallops or clicks. No pedal edema. Gastrointestinal system: Abdomen is nondistended, soft and nontender. No organomegaly or masses felt. Normal bowel sounds heard. Central nervous system: Alert and oriented. No focal neurological deficits. Extremities: Symmetric 5 x 5 power.  Left foot with dressing/Ace wrap in place, clean/dry/intact Skin: Left foot wound with dressing in place, clean/dry/intact; otherwise no rashes, lesions or ulcers Psychiatry: Judgement and insight appear normal. Mood & affect appropriate.     Data Reviewed: I have personally reviewed following labs and imaging studies  CBC: Recent Labs  Lab 04/10/21 1357 04/11/21 0245  WBC 26.1* 19.7*  NEUTROABS 21.5*  --   HGB 11.3* 11.2*  HCT 35.6* 35.1*  MCV 87.9 86.5  PLT 243 686   Basic Metabolic Panel: Recent Labs  Lab 04/10/21 1357 04/11/21 0245  NA 138 136  K 4.1 3.8  CL 102 106  CO2 26 26  GLUCOSE 213* 255*  BUN 40* 36*  CREATININE 2.70* 2.62*  CALCIUM 9.0 8.4*   GFR: Estimated Creatinine Clearance: 24.5 mL/min (A) (by C-G formula based on SCr of 2.62 mg/dL (H)). Liver Function Tests: Recent Labs  Lab 04/10/21 1357 04/11/21 0245  AST 22 20  ALT 20 17  ALKPHOS 108 103  BILITOT 1.1 0.8  PROT 7.5 6.8  ALBUMIN 2.8* 2.6*   No results  for input(s): LIPASE, AMYLASE in the last 168 hours. No results for input(s): AMMONIA in the last 168 hours. Coagulation Profile: Recent Labs  Lab 04/10/21 1357  INR 1.3*   Cardiac Enzymes: No results for input(s): CKTOTAL, CKMB, CKMBINDEX, TROPONINI in the last 168 hours. BNP (last 3 results) No results for input(s): PROBNP in the last 8760 hours. HbA1C: Recent Labs    04/10/21 1357  HGBA1C 7.8*   CBG: Recent Labs  Lab 04/10/21 2104 04/10/21 2355 04/11/21 0307 04/11/21 0747  04/11/21 1209  GLUCAP 174* 235* 242* 208* 144*   Lipid Profile: No results for input(s): CHOL, HDL, LDLCALC, TRIG, CHOLHDL, LDLDIRECT in the last 72 hours. Thyroid Function Tests: No results for input(s): TSH, T4TOTAL, FREET4, T3FREE, THYROIDAB in the last 72 hours. Anemia Panel: No results for input(s): VITAMINB12, FOLATE, FERRITIN, TIBC, IRON, RETICCTPCT in the last 72 hours. Sepsis Labs: Recent Labs  Lab 04/10/21 1357 04/10/21 1637  LATICACIDVEN 1.9 1.5    Recent Results (from the past 240 hour(s))  Resp Panel by RT-PCR (Flu A&B, Covid) Nasopharyngeal Swab     Status: None   Collection Time: 04/10/21  2:28 PM   Specimen: Nasopharyngeal Swab; Nasopharyngeal(NP) swabs in vial transport medium  Result Value Ref Range Status   SARS Coronavirus 2 by RT PCR NEGATIVE NEGATIVE Final    Comment: (NOTE) SARS-CoV-2 target nucleic acids are NOT DETECTED.  The SARS-CoV-2 RNA is generally detectable in upper respiratory specimens during the acute phase of infection. The lowest concentration of SARS-CoV-2 viral copies this assay can detect is 138 copies/mL. A negative result does not preclude SARS-Cov-2 infection and should not be used as the sole basis for treatment or other patient management decisions. A negative result may occur with  improper specimen collection/handling, submission of specimen other than nasopharyngeal swab, presence of viral mutation(s) within the areas targeted by this assay, and inadequate number of viral copies(<138 copies/mL). A negative result must be combined with clinical observations, patient history, and epidemiological information. The expected result is Negative.  Fact Sheet for Patients:  EntrepreneurPulse.com.au  Fact Sheet for Healthcare Providers:  IncredibleEmployment.be  This test is no t yet approved or cleared by the [place] FDA and  has been authorized for detection and/or diagnosis of SARS-CoV-2  by FDA under an Emergency Use Authorization (EUA). This EUA will remain  in effect (meaning this test can be used) for the duration of the COVID-19 declaration under Section 564(b)(1) of the Act, 21 U.S.C.section 360bbb-3(b)(1), unless the authorization is terminated  or revoked sooner.       Influenza A by PCR NEGATIVE NEGATIVE Final   Influenza B by PCR NEGATIVE NEGATIVE Final    Comment: (NOTE) The Xpert Xpress SARS-CoV-2/FLU/RSV plus assay is intended as an aid in the diagnosis of influenza from Nasopharyngeal swab specimens and should not be used as a sole basis for treatment. Nasal washings and aspirates are unacceptable for Xpert Xpress SARS-CoV-2/FLU/RSV testing.  Fact Sheet for Patients: EntrepreneurPulse.com.au  Fact Sheet for Healthcare Providers: IncredibleEmployment.be  This test is not yet approved or cleared by the [place] FDA and has been authorized for detection and/or diagnosis of SARS-CoV-2 by FDA under an Emergency Use Authorization (EUA). This EUA will remain in effect (meaning this test can be used) for the duration of the COVID-19 declaration under Section 564(b)(1) of the Act, 21 U.S.C. section 360bbb-3(b)(1), unless the authorization is terminated or revoked.  Performed at [facility], [address], [place], [place] [address]  Blood culture (routine single)     Status: None (Preliminary result)   Collection Time: 04/10/21  2:37 PM   Specimen: BLOOD  Result Value Ref Range Status   Specimen Description BLOOD BLOOD LEFT FOREARM  Final   Special Requests   Final    BOTTLES DRAWN AEROBIC AND ANAEROBIC Blood Culture adequate volume   Culture   Final    NO GROWTH < 24 HOURS Performed at [facility], [address] [place], [place] [address]    Report Status PENDING  Incomplete  Culture, blood (single)     Status: None (Preliminary result)   Collection Time: 04/10/21  4:32 PM    Specimen: BLOOD  Result Value Ref Range Status   Specimen Description BLOOD BLOOD RIGHT HAND  Final   Special Requests   Final    BOTTLES DRAWN AEROBIC AND ANAEROBIC Blood Culture adequate volume   Culture   Final    NO GROWTH < 24 HOURS Performed at [facility], [address], [place], [place] [address]    Report Status PENDING  Incomplete  Aerobic/Anaerobic Culture w Gram Stain (surgical/deep wound)     Status: None (Preliminary result)   Collection Time: 04/10/21  8:34 PM   Specimen: PATH Other; Wound  Result Value Ref Range Status   Specimen Description   Final    FOOT LEFT Performed at [facility], [address] [place], [place] [address]    Special Requests   Final    NONE Performed at [facility], [place], [place] [address]    Gram Stain   Final    FEW SQUAMOUS EPITHELIAL CELLS PRESENT FEW WBC SEEN FEW GRAM POSITIVE RODS MODERATE GRAM NEGATIVE RODS MODERATE GRAM POSITIVE COCCI Performed at [facility], [place] [address], [place], [place] [address]    Culture PENDING  Incomplete   Report Status PENDING  Incomplete         Radiology Studies: MR FOOT LEFT WO CONTRAST  Result Date: 04/11/2021 CLINICAL DATA:  Osteomyelitis suspected, foot swelling, diabetic EXAM: MRI OF THE LEFT FOOT WITHOUT CONTRAST TECHNIQUE: Multiplanar, multisequence MR imaging of the left foot was performed. No intravenous contrast was administered. COMPARISON:  Radiograph dated April 10, 2021 FINDINGS: Bones/Joint/Cartilage Status post amputation of the second digit through the metatarsophalangeal joint. There is hyperintense signal in the second metatarsal head on stir and T2 sequences (series 12, image 21, series 10 image 13). Ligaments Intact Muscles and Tendons Increased intramuscular signal of the plantar muscles concerning for diabetic myopathy/myositis. No drainable fluid collection or abscess. The visualized tendons are intact Soft  tissues Marked subcutaneous soft tissue edema prominent about the dorsum of foot and about the prior amputation site. 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 Hyperintense signal in second metatarsal head, which in the presence of adjacent skin defect is concerning for osteomyelitis. Differential includes reactive marrow edema secondary to postsurgical changes. Marked soft tissue edema about the amputation site and dorsum of the foot. Diabetic myopathy/myositis of the plantar muscles. No drainable fluid collection or abscess. Electronically Signed   By: Keane Police D.O.   On: 04/11/2021 12:27   US ARTERIAL ABI (SCREENING LOWER EXTREMITY)  Result Date: 04/11/2021 CLINICAL DATA:  60 year old female with gangrenous left foot. EXAM: NONINVASIVE PHYSIOLOGIC VASCULAR STUDY OF BILATERAL LOWER EXTREMITIES TECHNIQUE: Evaluation of both lower extremities were performed at rest, including calculation of ankle-brachial indices  with single level Doppler, pressure and pulse volume recording. COMPARISON:  None. FINDINGS: Right ABI:  0.90 Left ABI:  0.53 Right Lower Extremity: Biphasic waveforms in the posterior tibial artery, monophasic waveforms in the dorsalis pedis at the level of the ankle. Left Lower Extremity: Monophasic waveforms at the level of the ankle. IMPRESSION: 1. Moderate to severe left lower extremity peripheral artery disease (ABI = 0.53). 2. Mild right lower extremity peripheral artery disease (ABI = 0.90). Ruthann Cancer, MD [facility] Radiology Electronically Signed   By: Ruthann Cancer M.D.   On: 04/11/2021 12:12   DG Foot Complete Left  Result Date: 04/10/2021 CLINICAL DATA:  Postop EXAM: LEFT FOOT - COMPLETE 3+ VIEW COMPARISON:  04/10/2021 FINDINGS: Interval amputation of the second digit at the level of the MTP joint. No acute fracture or  malalignment. Resolution of previously noted soft tissue emphysema. IMPRESSION: Interval amputation of the second digit at the level of MTP joint with expected postsurgical change Electronically Signed   By: Donavan Foil M.D.   On: 04/10/2021 21:38   DG Foot Complete Left  Result Date: 04/10/2021 CLINICAL DATA:  Rule out osteomyelitis. EXAM: LEFT FOOT - COMPLETE 3+ VIEW COMPARISON:  None. FINDINGS: Comminuted fracture involving the second proximal phalanx, both base and shaft. Extensive regional soft tissue gas tracking along the plantar forefoot. No adjacent erosion. Generalized osteopenia. These results will be called to the ordering clinician or representative by the Radiologist Assistant, and communication documented in the PACS or [employer]. IMPRESSION: 1. Comminuted and displaced second proximal phalanx fracture. 2. Soft tissue emphysema involving the digits and forefoot around the fracture, history suggesting necrotizing soft tissue infection. Electronically Signed   By: Jorje Guild M.D.   On: 04/10/2021 11:52        Scheduled Meds:  carvedilol  12.5 mg Oral BID WC   dapagliflozin propanediol  10 mg Oral Daily   digoxin  125 mcg Oral Daily   insulin aspart       insulin aspart  0-15 Units Subcutaneous Q4H   insulin aspart  0-5 Units Subcutaneous QHS   insulin glargine-yfgn  20 Units Subcutaneous Once   NIFEdipine  30 mg Oral Daily   rosuvastatin  10 mg Oral QHS   Continuous Infusions:  linezolid (ZYVOX) IV Stopped (04/11/21 0438)   piperacillin-tazobactam (ZOSYN)  IV Stopped (04/11/21 0938)     LOS: 0 days    Time spent: 43 minutes spent on chart review, discussion with nursing staff, consultants, updating family and interview/physical exam; more than 50% of that time was spent in counseling and/or coordination of care.    Lorenz Donley J [place] DO [facility] Available via Epic secure chat 7am-7pm After these hours, please refer to coverage provider listed on  [email or web address] 04/11/2021, 1:03 PM

## 2021-04-11 NOTE — Op Note (Signed)
OPERATIVE REPORT Patient name: Chelsea Barton MRN: [id number] DOB: [birth year]  DOS: 04/10/2021  Preop Dx: Rozanna Boer gangrene/necrotizing fasciitis left foot Postop Dx: same  Procedure:  1.  Incision and drainage left foot 2.  Left second toe amputation  Surgeon: Edrick Kins DPM  Anesthesia: 50-50 mixture of 2% lidocaine plain with 0.5% Marcaine plain totaling 20 mL infiltrated in the patient's left lower extremity in an ankle block fashion with monitored anesthesia care  Hemostasis: Calf tourniquet inflated to a pressure of 287mmHg without Esmarch examination  EBL: 20 mL Materials: None Injectables: None Pathology: Cultures taken and sent to pathology  Condition: The patient tolerated the procedure and anesthesia well. No complications noted or reported   Justification for procedure: The patient is a 60 y.o. female stage IV CKD diabetes mellitus type 1 who presents today for surgical correction of necrotizing fasciitis/gas gangrene left foot presenting as an emergent case. All conservative modalities of been unsuccessful in providing any sort of satisfactory alleviation of symptoms with the patient. The patient was told benefits as well as possible side effects of the surgery. The patient consented for surgical correction. The patient consent form was reviewed. All patient questions were answered. No guarantees were expressed or implied. The patient and the surgeon both signed the patient consent form with the witness present and placed in the patient's chart.   Procedure in Detail: The patient was brought to the operating room, placed in the operating table in the supine position at which time an aseptic scrub and drape were performed about the patient's respective lower extremity after anesthesia was induced as described above. Attention was then directed to the surgical area where procedure number one commenced.  Procedure #1: Incision and drainage left foot Attention was directed  to the first interspace of the left foot where there was a notable draining sinus to the first interspace.  A curved mosquito was utilized to open the first interspace and releases heavy amount of purulence within the area.  Blunt dissection was utilized in combination with the #15 scalpel to create an incision along the dorsum of the foot and allow the area to drain.  Heavy amount of purulence was expressed from the area.  Combination of blunt and sharp soft tissue dissection was utilized to explore the compartments of the foot and drain the gas within the tissues.  Procedure #2 amputation second digit left foot After extensive incision and drainage with blunt dissection around the areas of necrotic tissue, there were multiple necrotic bone fragments of the second toe that were noted on x-ray preoperatively.  These fragments were debrided and removed.  After removal of the bone fragments within the area the toe was essentially nonviable.  The intraoperative decision was made to completely remove the second digit.  The toe was disarticulated at the MTP joint and removed in toto.  Additional soft tissue dissection was performed and the plantar compartments of the foot were explored again to drain any purulence.  3 L of normal saline combination with pulse lavage was utilized to copiously irrigate the area.  Saline wet-to-dry sterile compressive dressings were then applied to all previously mentioned incision sites about the patient's lower extremity. The tourniquet which was used for hemostasis was deflated.  The patient was then transferred from the operating room to the recovery room having tolerated the procedure and anesthesia well. All vital signs are stable. After a brief stay in the recovery room the patient was readmitted to inpatient room with adequate  prescriptions for analgesia. Verbal as well as written instructions were provided for the patient regarding wound care.   The patient will need to  be further worked up to establish a vascular baseline and she will need to return to the OR over the next 24-48 hours for additional debridement and additional amputation of the foot.  MRI was also ordered as well as ABIs this evening.  Podiatry will continue to follow while inpatient  Edrick Kins, DPM [facility]  Dr. Edrick Kins, DPM    [address], [place] [address]                Office [phone]  Fax [phone]

## 2021-04-11 NOTE — Progress Notes (Signed)
  Subjective:  Patient ID: Chelsea Barton, female    DOB: August 24, 1960,  MRN: [id number]  Patient seen at bedside resting comfortably her dressing came off feels well not having any pain  Negative for chest pain and shortness of breath Chest pain: no Shortness of breath: no Constitutional signs: no Review of all other systems is negative Objective:   Vitals:   04/11/21 1515 04/11/21 1630  BP: (!) 154/51 (!) 180/67  Pulse: 87 84  Resp: 15 18  Temp: 99.1 F (37.3 C) 98.4 F (36.9 C)  SpO2: 94% 95%   General AA&O x3. Normal mood and affect.  Vascular Weak pulses foot is warm capillary fill time intact left foot  Neurologic Epicritic sensation grossly absent.  Dermatologic Open surgical wound granular fibrous wound bed no purulence found no malodor cellulitis and swelling improving skin the hallux dusky  Orthopedic: MMT 5/5 in dorsiflexion, plantarflexion, inversion, and eversion.      Assessment & Plan:  Patient was evaluated and treated and all questions answered.  Left foot diabetic foot infection status post toe amputation and debridement -ABIs reviewed vascular surgery consult has been ordered.  Likely requires intervention -I discussed transmetatarsal amputation with her she is hesitant and scared to do this but I think this may be her fastest way to recovery.  Large soft tissue defect would require significant reconstruction with tissue substitute wound VAC and skin grafting.  Dr. Posey Pronto will discuss further with her tomorrow -Surgery tomorrow with Dr. Posey Pronto -N.p.o. past midnight  [name], DPM  Accessible via secure chat for questions or concerns.

## 2021-04-12 ENCOUNTER — Encounter: Payer: Self-pay | Admitting: Internal Medicine

## 2021-04-12 ENCOUNTER — Encounter
Admission: EM | Disposition: A | Discharge status: Home Health | Payer: Self-pay | Source: Home / Self Care | Attending: Internal Medicine

## 2021-04-12 ENCOUNTER — Inpatient Hospital Stay: Payer: Medicaid Other

## 2021-04-12 ENCOUNTER — Inpatient Hospital Stay: Payer: Medicaid Other | Admitting: Anesthesiology

## 2021-04-12 DIAGNOSIS — L0390 Cellulitis, unspecified: Secondary | ICD-10-CM | POA: Diagnosis not present

## 2021-04-12 HISTORY — PX: TRANSMETATARSAL AMPUTATION: SHX6197

## 2021-04-12 LAB — LIPID PANEL
Cholesterol: 84 mg/dL (ref 0–200)
HDL: 25 mg/dL — ABNORMAL LOW (ref >40)
LDL Cholesterol: 42 mg/dL (ref 0–99)
Total CHOL/HDL Ratio: 3.4 RATIO
Triglycerides: 87 mg/dL (ref <150)
VLDL: 17 mg/dL (ref 0–40)

## 2021-04-12 LAB — GLUCOSE, CAPILLARY
Glucose-Capillary: 317 mg/dL — ABNORMAL HIGH (ref 70–99)
Glucose-Capillary: 188 mg/dL — ABNORMAL HIGH (ref 70–99)
Glucose-Capillary: 107 mg/dL — ABNORMAL HIGH (ref 70–99)
Glucose-Capillary: 117 mg/dL — ABNORMAL HIGH (ref 70–99)
Glucose-Capillary: 131 mg/dL — ABNORMAL HIGH (ref 70–99)
Glucose-Capillary: 158 mg/dL — ABNORMAL HIGH (ref 70–99)

## 2021-04-12 LAB — BASIC METABOLIC PANEL
Anion gap: 9 (ref 5–15)
BUN: 29 mg/dL — ABNORMAL HIGH (ref 8–23)
CO2: 25 mmol/L (ref 22–32)
Calcium: 8.1 mg/dL — ABNORMAL LOW (ref 8.9–10.3)
Chloride: 102 mmol/L (ref 98–111)
Creatinine, Ser: 2.52 mg/dL — ABNORMAL HIGH (ref 0.44–1.00)
GFR, Estimated: 21 mL/min — ABNORMAL LOW (ref >60)
Glucose, Bld: 348 mg/dL — ABNORMAL HIGH (ref 70–99)
Potassium: 3.9 mmol/L (ref 3.5–5.1)
Sodium: 136 mmol/L (ref 135–145)

## 2021-04-12 LAB — CBC
HCT: 35.1 % — ABNORMAL LOW (ref 36.0–46.0)
Hemoglobin: 11.2 g/dL — ABNORMAL LOW (ref 12.0–15.0)
MCH: 27.1 pg (ref 26.0–34.0)
MCHC: 31.9 g/dL (ref 30.0–36.0)
MCV: 84.8 fL (ref 80.0–100.0)
Platelets: 259 10*3/uL (ref 150–400)
RBC: 4.14 MIL/uL (ref 3.87–5.11)
RDW: 13.6 % (ref 11.5–15.5)
WBC: 19.5 10*3/uL — ABNORMAL HIGH (ref 4.0–10.5)
nRBC: 0 % (ref 0.0–0.2)

## 2021-04-12 LAB — SURGICAL PATHOLOGY

## 2021-04-12 SURGERY — AMPUTATION, FOOT, TRANSMETATARSAL
Anesthesia: General | Site: Toe | Laterality: Left

## 2021-04-12 MED ORDER — SODIUM CHLORIDE 0.9 % IV SOLN
INTRAVENOUS | Status: DC | PRN
Start: 2021-04-12 — End: 2021-04-12

## 2021-04-12 MED ORDER — LIDOCAINE HCL 1 % IJ SOLN
INTRAMUSCULAR | Status: DC | PRN
  Administered 2021-04-12: 10 mL

## 2021-04-12 MED ORDER — PIPERACILLIN-TAZOBACTAM 3.375 G IVPB
INTRAVENOUS | Status: AC
  Filled 2021-04-12: qty 50

## 2021-04-12 MED ORDER — LIDOCAINE HCL (PF) 1 % IJ SOLN
INTRAMUSCULAR | Status: AC
  Filled 2021-04-12: qty 30

## 2021-04-12 MED ORDER — 0.9 % SODIUM CHLORIDE (POUR BTL) OPTIME
TOPICAL | Status: DC | PRN
  Administered 2021-04-12: 3000 mL

## 2021-04-12 MED ORDER — MIDAZOLAM HCL 2 MG/2ML IJ SOLN
INTRAMUSCULAR | Status: AC
  Filled 2021-04-12: qty 2

## 2021-04-12 MED ORDER — GLYCOPYRROLATE 0.2 MG/ML IJ SOLN
INTRAMUSCULAR | Status: DC | PRN
  Administered 2021-04-12: .2 mg via INTRAVENOUS

## 2021-04-12 MED ORDER — FENTANYL CITRATE (PF) 100 MCG/2ML IJ SOLN
INTRAMUSCULAR | Status: DC | PRN
  Administered 2021-04-12: 50 ug via INTRAVENOUS

## 2021-04-12 MED ORDER — PROPOFOL 500 MG/50ML IV EMUL
INTRAVENOUS | Status: DC | PRN
  Administered 2021-04-12: 75 ug/kg/min via INTRAVENOUS

## 2021-04-12 MED ORDER — PROPOFOL 10 MG/ML IV BOLUS
INTRAVENOUS | Status: DC | PRN
  Administered 2021-04-12: 40 mg via INTRAVENOUS

## 2021-04-12 MED ORDER — BUPIVACAINE HCL (PF) 0.5 % IJ SOLN
INTRAMUSCULAR | Status: AC
  Filled 2021-04-12: qty 30

## 2021-04-12 MED ORDER — FENTANYL CITRATE (PF) 100 MCG/2ML IJ SOLN
INTRAMUSCULAR | Status: AC
  Filled 2021-04-12: qty 2

## 2021-04-12 MED ORDER — MIDAZOLAM HCL 2 MG/2ML IJ SOLN
INTRAMUSCULAR | Status: DC | PRN
  Administered 2021-04-12: 2 mg via INTRAVENOUS

## 2021-04-12 MED ORDER — SODIUM CHLORIDE 0.9 % IV SOLN: Freq: Once | INTRAVENOUS | Status: AC

## 2021-04-12 MED ORDER — INSULIN ASPART 100 UNIT/ML IJ SOLN
0.0000 [IU] | INTRAMUSCULAR | Status: DC
  Administered 2021-04-12: 11 [IU] via SUBCUTANEOUS
  Administered 2021-04-12 (×2): 3 [IU] via SUBCUTANEOUS
  Administered 2021-04-13: 2 [IU] via SUBCUTANEOUS
  Administered 2021-04-13: 3 [IU] via SUBCUTANEOUS
  Administered 2021-04-13: 2 [IU] via SUBCUTANEOUS
  Administered 2021-04-13: 3 [IU] via SUBCUTANEOUS
  Filled 2021-04-12 (×5): qty 1

## 2021-04-12 SURGICAL SUPPLY — 50 items
BLADE OSCILLATING/SAGITTAL (BLADE) ×1
BLADE SURG 10 STRL SS SAFETY (BLADE) IMPLANT
BLADE SW THK.38XMED LNG THN (BLADE) ×1 IMPLANT
BNDG COHESIVE 4X5 TAN ST LF (GAUZE/BANDAGES/DRESSINGS) ×2 IMPLANT
BNDG ELASTIC 4X5.8 VLCR NS LF (GAUZE/BANDAGES/DRESSINGS) ×2 IMPLANT
BNDG ESMARK 4X12 TAN STRL LF (GAUZE/BANDAGES/DRESSINGS) ×2 IMPLANT
BNDG GAUZE ELAST 4 BULKY (GAUZE/BANDAGES/DRESSINGS) ×2 IMPLANT
BNDG STRETCH 4X75 STRL LF (GAUZE/BANDAGES/DRESSINGS) ×2 IMPLANT
CUFF TOURN SGL QUICK 18X4 (TOURNIQUET CUFF) IMPLANT
CUFF TOURN SGL QUICK 24 (TOURNIQUET CUFF) ×1
CUFF TRNQT CYL 24X4X16.5-23 (TOURNIQUET CUFF) ×1 IMPLANT
DRAIN PENROSE 12X.25 LTX STRL (MISCELLANEOUS) IMPLANT
DRAPE FLUOR MINI C-ARM 54X84 (DRAPES) IMPLANT
DRSG EMULSION OIL 3X8 NADH (GAUZE/BANDAGES/DRESSINGS) IMPLANT
DURAPREP 26ML APPLICATOR (WOUND CARE) ×2 IMPLANT
ELECT REM PT RETURN 9FT ADLT (ELECTROSURGICAL) ×2
ELECTRODE REM PT RTRN 9FT ADLT (ELECTROSURGICAL) ×1 IMPLANT
GAUZE 4X4 16PLY ~~LOC~~+RFID DBL (SPONGE) ×2 IMPLANT
GAUZE PACKING IODOFORM 1/2 (PACKING) IMPLANT
GAUZE SPONGE 4X4 12PLY STRL (GAUZE/BANDAGES/DRESSINGS) ×4 IMPLANT
GLOVE SRG 8 PF TXTR STRL LF DI (GLOVE) ×1 IMPLANT
GLOVE SURG ENC TEXT LTX SZ8 (GLOVE) ×2 IMPLANT
GLOVE SURG UNDER POLY LF SZ8 (GLOVE) ×1
GOWN STRL REUS W/ TWL XL LVL3 (GOWN DISPOSABLE) ×1 IMPLANT
GOWN STRL REUS W/TWL XL LVL3 (GOWN DISPOSABLE) ×1
HANDLE YANKAUER SUCT BULB TIP (MISCELLANEOUS) IMPLANT
KIT TURNOVER KIT A (KITS) ×2 IMPLANT
LABEL OR SOLS (LABEL) IMPLANT
MANIFOLD NEPTUNE II (INSTRUMENTS) ×2 IMPLANT
NDL SAFETY ECLIPSE 18X1.5 (NEEDLE) ×1 IMPLANT
NEEDLE FILTER BLUNT 18X 1/2SAF (NEEDLE) ×1
NEEDLE FILTER BLUNT 18X1 1/2 (NEEDLE) ×1 IMPLANT
NEEDLE HYPO 18GX1.5 SHARP (NEEDLE) ×1
NS IRRIG 500ML POUR BTL (IV SOLUTION) ×2 IMPLANT
PACK EXTREMITY ARMC (MISCELLANEOUS) ×2 IMPLANT
PAD ABD DERMACEA PRESS 5X9 (GAUZE/BANDAGES/DRESSINGS) ×4 IMPLANT
PULSAVAC PLUS IRRIG FAN TIP (DISPOSABLE) ×2
SOL .9 NS 3000ML IRR  AL (IV SOLUTION) ×1
SOL .9 NS 3000ML IRR UROMATIC (IV SOLUTION) ×1 IMPLANT
SOL PREP PVP 2OZ (MISCELLANEOUS) ×2
SOLUTION PREP PVP 2OZ (MISCELLANEOUS) ×1 IMPLANT
SPONGE T-LAP 18X18 ~~LOC~~+RFID (SPONGE) IMPLANT
STAPLER SKIN PROX 35W (STAPLE) IMPLANT
STOCKINETTE M/LG 89821 (MISCELLANEOUS) IMPLANT
SUT PROLENE 2 0 FS (SUTURE) ×4 IMPLANT
SUT PROLENE 3 0 PS 2 (SUTURE) ×6 IMPLANT
SWAB CULTURE AMIES ANAERIB BLU (MISCELLANEOUS) IMPLANT
SYR 10ML LL (SYRINGE) ×4 IMPLANT
TIP FAN IRRIG PULSAVAC PLUS (DISPOSABLE) ×1 IMPLANT
WATER STERILE IRR 500ML POUR (IV SOLUTION) ×2 IMPLANT

## 2021-04-12 NOTE — Anesthesia Preprocedure Evaluation (Addendum)
Anesthesia Evaluation  Patient identified by MRN, date of birth, ID band Patient awake    Reviewed: Allergy & Precautions, H&P , NPO status , Patient's Chart, lab work & pertinent test results, reviewed documented beta blocker date and time   History of Anesthesia Complications Negative for: history of anesthetic complications  Airway Mallampati: III  TM Distance: >3 FB Neck ROM: full    Dental  (+) Dental Advidsory Given, Edentulous Upper, Edentulous Lower   Pulmonary neg shortness of breath, former smoker,    Pulmonary exam normal        Cardiovascular Exercise Tolerance: Good hypertension, Pt. on medications and Pt. on home beta blockers (-) angina+ CAD  (-) Past MI and (-) Cardiac Stents Normal cardiovascular exam(-) dysrhythmias + Valvular Problems/Murmurs ( mitral insufficiency)    LVH   Neuro/Psych  Neuromuscular disease negative psych ROS   GI/Hepatic negative GI ROS, Neg liver ROS,   Endo/Other  diabetes, Poorly Controlled, Insulin Dependentsecondary hyperparathyroidism  Renal/GU CRFRenal disease (CKD stage IV)  negative genitourinary   Musculoskeletal osteomyelitis   Abdominal (+) + obese,   Peds  Hematology  (+) anemia ,   Anesthesia Other Findings  Past Medical History: No date: Diabetes mellitus No date: Hyperlipidemia No date: Hypertension  BMI    Body Mass Index: 31.62 kg/m      Reproductive/Obstetrics negative OB ROS                          Anesthesia Physical  Anesthesia Plan  ASA: 3  Anesthesia Plan: General   Post-op Pain Management:    Induction: Intravenous  PONV Risk Score and Plan: 3 and TIVA and Propofol infusion  Airway Management Planned: Natural Airway and Nasal Cannula  Additional Equipment:   Intra-op Plan:   Post-operative Plan:   Informed Consent: I have reviewed the patients History and Physical, chart, labs and discussed the procedure  including the risks, benefits and alternatives for the proposed anesthesia with the patient or authorized representative who has indicated his/her understanding and acceptance.     Dental Advisory Given  Plan Discussed with: Anesthesiologist, CRNA and Surgeon  Anesthesia Plan Comments: (Patient consented for risks of anesthesia including but not limited to:  - adverse reactions to medications - risk of airway placement if required - damage to eyes, teeth, lips or other oral mucosa - nerve damage due to positioning  - sore throat or hoarseness - Damage to heart, brain, nerves, lungs, other parts of body or loss of life  Patient voiced understanding.)       Anesthesia Quick Evaluation

## 2021-04-12 NOTE — Transfer of Care (Signed)
Immediate Anesthesia Transfer of Care Note  Patient: Chelsea Barton  Procedure(s) Performed: TRANSMETATARSAL AMPUTATION (Left: Toe)  Patient Location: PACU  Anesthesia Type:General  Level of Consciousness: drowsy and patient cooperative  Airway & Oxygen Therapy: Patient Spontanous Breathing and Patient connected to face mask oxygen  Post-op Assessment: Report given to RN and Post -op Vital signs reviewed and stable  Post vital signs: Reviewed and stable  Last Vitals:  Vitals Value Taken Time  BP 125/60 04/12/21 1656  Temp    Pulse 70 04/12/21 1658  Resp 20 04/12/21 1658  SpO2 94 % 04/12/21 1658  Vitals shown include unvalidated device data.  Last Pain:  Vitals:   04/12/21 1537  TempSrc: Temporal  PainSc: 0-No pain         Complications: No notable events documented.

## 2021-04-12 NOTE — TOC Progression Note (Signed)
Transition of Care [facility]) - Progression Note    Patient Details  Name: Chelsea Barton MRN: [id number] Date of Birth: 1960-12-22  Transition of Care [facility]) CM/SW Contact  Pete Pelt, RN Phone Number: 04/12/2021, 4:12 PM  Clinical Narrative:   Patient in [place] will see in AM         Expected Discharge Plan and Services                                                 Social Determinants of Health (SDOH) Interventions    Readmission Risk Interventions No flowsheet data found.

## 2021-04-12 NOTE — Anesthesia Postprocedure Evaluation (Signed)
Anesthesia Post Note  Patient: Chelsea Barton  Procedure(s) Performed: TRANSMETATARSAL AMPUTATION (Left: Toe)  Patient location during evaluation: PACU Anesthesia Type: General Level of consciousness: awake and alert Pain management: pain level controlled Vital Signs Assessment: post-procedure vital signs reviewed and stable Respiratory status: spontaneous breathing, nonlabored ventilation, respiratory function stable and patient connected to nasal cannula oxygen Cardiovascular status: blood pressure returned to baseline and stable Postop Assessment: no apparent nausea or vomiting Anesthetic complications: no   No notable events documented.   Last Vitals:  Vitals:   04/12/21 1715 04/12/21 1741  BP: (!) 145/67 (!) 153/64  Pulse: 70 72  Resp: 15 15  Temp:  36.8 C  SpO2: 97% 95%    Last Pain:  Vitals:   04/12/21 1715  TempSrc:   PainSc: 0-No pain                 Precious Haws Dawnelle Warman

## 2021-04-12 NOTE — Progress Notes (Signed)
Inpatient Diabetes Program Recommendations  AACE/ADA: New Consensus Statement on Inpatient Glycemic Control (2015)  Target Ranges:  Prepandial:   less than 140 mg/dL      Peak postprandial:   less than 180 mg/dL (1-2 hours)      Critically ill patients:  140 - 180 mg/dL  Results for Chelsea Barton, Chelsea Barton (MRN [id number]) as of 04/12/2021 07:08  Ref. Range 04/11/2021 07:47 04/11/2021 12:09 04/11/2021 15:50 04/11/2021 16:25 04/11/2021 22:22  Glucose-Capillary Latest Ref Range: 70 - 99 mg/dL 208 (H)  5 units Novolog  144 (H) 244 (H) 251 (H)  8 units Novolog  293 (H)  3 units Novolog  20 units Semglee  Results for Chelsea Barton, Chelsea Barton (MRN [id number]) as of 04/12/2021 09:24  Ref. Range 04/12/2021 07:24  Glucose-Capillary Latest Ref Range: 70 - 99 mg/dL 317 (H)    Home DM Meds: Lantus 40 units daily       Novolog 5 units Breakfast/ 10 units Lunch/ 10 units Dinner       Farxiga 10 mg daily   Current Orders: Semglee 20 units QHS  Novolog 0-15 units TID ac/hs Farxiga 10 mg daily     MD- Note pt NPO this AM for Vascular Procedure  After procedure today and once pt resumes PO diet, please consider the following:  1. Increase Semglee to 30 units QHS (takes 40 units Lantus daily at home)  2. Start Novolog Meal Coverage: Novolog 5 units TID with meals Hold if pt eats <50% of meal, Hold if pt NPO     --Will follow patient during hospitalization--  Wyn Quaker RN, MSN, CDE Diabetes Coordinator Inpatient Glycemic Control Team Team Pager: [phone] (8a-5p)

## 2021-04-12 NOTE — Brief Op Note (Signed)
04/10/2021 - 04/12/2021  4:47 PM  PATIENT:  Chelsea Barton  60 y.o. female  PRE-OPERATIVE DIAGNOSIS:  osteomyelitis  POST-OPERATIVE DIAGNOSIS:  osteomyelitis  PROCEDURE:  Procedure(s): TRANSMETATARSAL AMPUTATION (Left)  SURGEON:  Surgeon(s) and Role:    * Felipa Furnace, DPM - Primary  PHYSICIAN ASSISTANT:   ASSISTANTS: none   ANESTHESIA:   local and IV sedation  EBL: Minimal  BLOOD ADMINISTERED:none  DRAINS: none   LOCAL MEDICATIONS USED:  MARCAINE    and BUPIVICAINE   SPECIMEN: Left forefoot  DISPOSITION OF SPECIMEN:  PATHOLOGY  COUNTS:  YES  TOURNIQUET:  * Missing tourniquet times found for documented tourniquets in log: [id number] *  DICTATION: .Dragon Dictation  PLAN OF CARE: Admit to inpatient   PATIENT DISPOSITION:  PACU - hemodynamically stable.   Delay start of Pharmacological VTE agent (>24hrs) due to surgical blood loss or risk of bleeding: no  Diminished Intra-Op bleeding noted.  No purulent drainage was noted.  Transmetatarsal amputation with primary closure.  Nonweightbearing to the left lower extremity in cam boot.

## 2021-04-12 NOTE — Anesthesia Procedure Notes (Signed)
Procedure Name: General with mask airway Date/Time: 04/12/2021 4:34 PM Performed by: Kelton Pillar, CRNA Pre-anesthesia Checklist: Emergency Drugs available, Suction available, Patient being monitored and Timeout performed Patient Re-evaluated:Patient Re-evaluated prior to induction Oxygen Delivery Method: Simple face mask Induction Type: IV induction Placement Confirmation: positive ETCO2 and CO2 detector Dental Injury: Teeth and Oropharynx as per pre-operative assessment

## 2021-04-12 NOTE — Progress Notes (Signed)
PROGRESS NOTE    Chelsea Barton  MZT:[id number] DOB: Mar 18, 1961 DOA: 04/10/2021 PCP: Burnard Hawthorne, FNP    Brief Narrative:  Chelsea Barton is a 60 year old female with past medical history significant for type 2 diabetes mellitus, CKD stage IV, essential hypertension, hyperlipidemia, LVH, mitral insufficiency, secondary hyperparathyroidism, hyperlipidemia who presented to [facility] ED on 10/25 with left foot pain and swelling.  Onset roughly 2 days ago.  Denies any trauma.   In the ED, temperature 98.4 F, HR 72, RR 18, BP 140/55, SPO2 97% on room air.  Sodium 138, potassium 4.1, chloride 102, CO2 26, glucose 213, BUN 40, creatinine 2.70.  Lactic acid 1.9.  WBC 26.1, hemoglobin 11.3, platelets 243.  INR 1.3.  COVID-19 PCR negative.  Fluenz A/B PCR negative.  X-ray left foot with comminuted and displaced second proximal phalanx fracture, soft tissue emphysema involving the digits and forefoot around the fracture suggestive of necrotizing soft tissue infection.  Podiatry was consulted.  Patient was started empiric antibiotics.  TRH consulted for further evaluation and management of left foot diabetic infection.   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 X-ray left foot with comminuted and displaced second proximal phalanx fracture, soft tissue emphysema involving the digits and forefoot around the fracture suggestive of  necrotizing soft tissue infection.  Patient underwent incision and drainage and left second toe amputation by podiatry, Dr. Amalia Hailey on 04/10/2021.  MRI left foot with hyperintense signal Sica metatarsal head with adjacent skin defect concerning for vasculitis, marked soft tissue edema about amputation site, diabetic myopathy/myositis of the plantar muscles without drainable fluid collection/abscess. --Podiatry following, appreciate assistance --WBC 261>19.7>19.5 --ESR 72 --CRP 27.8 --Blood cultures x2: no growth x 2 days --Zyvox 600 mg IV q12h --Zosyn 3.$RemoveBefore'375mg'[id number]$  IV q8h --Oxycodone 5 mg q4h prn moderate pain --Morphine 2 mg IV q2h PRN severe pain --Podiatry transmetatarsal amputation today --CBC daily  Peripheral vascular disease Ultrasound arterial ABI 10/26 with moderate to severe left lower extremity peripheral artery disease with ABI 0.53 and mild right lower extremity peripheral artery disease with ABI 0.90. --Vascular following, appreciate assistance --Plan angiogram with possible intervention on 04/13/2021  Type 2 diabetes mellitus with hyperglycemia Hemoglobin A1c 7.8 and 04/10/2021, not optimally controlled.  Home regimen includes Lantus 40 units apparently daily, Farxiga 10 mg p.o. daily. --Semglee 20u [place] QHS --NovoLog SSI for further coverage --CBGs qAC/HS  Acute renal failure on CKD stage IV Creatinine elevated 2.70 on admission, baseline 2.3-2.4.  Etiology likely secondary to acute infectious process as above. --Cr 2.70>2.62>2.52  --Holding home enalapril --Avoid nephrotoxins, renal dose all medications --BMP daily  Essential hypertension Home regimen includes carvedilol 12.5 mg p.o. twice daily, enalapril 40 mg p.o. daily, digoxin 0.15 mg p.o. daily, nifedipine 30 mg p.o. daily, hydralazine prn. --Carvedilol 12.5 mg p.o. twice daily --Digoxin 0.125 mg p.o. daily --Holding enalapril due to AKI as above.  Hyperlipidemia HDL 25, LDL 42. --continue home Crestor 10 mg p.o.  daily  Obesity Body mass index is 31.62 kg/m.  Discussed with patient needs for aggressive lifestyle changes/weight loss as this complicates all facets of care.  Outpatient follow-up with PCP.     DVT prophylaxis: SCDs Start: 04/10/21 2138 Place TED hose Start: 04/10/21 1455   Code Status: Full Code Family Communication: No family present at bedside this morning  Disposition Plan:  Level of care: Med-Surg Status is: Observation  The patient will require care spanning > 2 midnights and should be moved to inpatient because: IV antibiotics, further debridement/amputation, vascular surgery consultation with likely need of intervention.   Consultants:  Podiatry, Dr. Amalia Hailey Vascular surgery  Procedures:  Incision and drainage left foot with left second toe amputation, podiatry Dr. Amalia Hailey 10/25  Antimicrobials:  Zyvox 10/25>> Zosyn 10/25>> Vancomycin 10/25 - 10/25 Clindamycin 10/25 - 10/25     Subjective: Patient seen examined bedside, resting comfortably.  Watching TV.  Lites this morning.  Pain-free.  Podiatry plans transmetatarsal amputation today and vascular surgery plans angiogram with possible intervention tomorrow.  No other questions or concerns at this time.  Denies headache, no current fever/chills/night sweats, no nausea/vomiting/diarrhea, no chest pain, palpitations, no abdominal pain, no weakness, no fatigue, no paresthesias.  No acute events overnight per nurse staff.  Objective: Vitals:   04/12/21 0617 04/12/21 0725 04/12/21 0900 04/12/21 1154  BP: (!) 174/62 (!) 163/56 (!) 192/68 (!) 178/70  Pulse: 74 71 68 70  Resp: 18 18  16   Temp: 98.3 F (36.8 C) 98.2 F (36.8 C)  98.3 F (36.8 C)  TempSrc: Oral Oral  Oral  SpO2: 94% 100%  94%  Weight:      Height:        Intake/Output Summary (Last 24 hours) at 04/12/2021 1228 Last data filed at 04/12/2021 3559 Gross per 24 hour  Intake 1187.63 ml  Output --  Net 1187.63 ml   Filed Weights   04/10/21 1242   Weight: 86.2 kg    Examination:  General exam: Appears calm and comfortable, obese Respiratory system: Clear to auscultation. Respiratory effort normal.  On room air Cardiovascular system: S1 & S2 heard, RRR. No JVD, murmurs, rubs, gallops or clicks. No pedal edema. Gastrointestinal system: Abdomen is nondistended, soft and nontender. No organomegaly or masses felt. Normal bowel sounds heard. Central nervous system: Alert and oriented. No focal neurological deficits. Extremities: Symmetric 5 x 5 power.  Left foot with dressing/Ace wrap in place, clean/dry/intact Skin: Left foot wound with dressing in place, clean/dry/intact; otherwise no rashes, lesions or ulcers Psychiatry: Judgement and insight appear normal. Mood & affect appropriate.     Data Reviewed: I have personally reviewed following labs and imaging studies  CBC: Recent Labs  Lab 04/10/21 1357 04/11/21 0245 04/12/21 0429  WBC 26.1* 19.7* 19.5*  NEUTROABS 21.5*  --   --   HGB 11.3* 11.2* 11.2*  HCT 35.6* 35.1* 35.1*  MCV 87.9 86.5 84.8  PLT 243 247 741   Basic Metabolic Panel: Recent Labs  Lab 04/10/21 1357 04/11/21 0245 04/12/21 0429  NA 138 136 136  K 4.1 3.8 3.9  CL 102 106 102  CO2 26 26 25   GLUCOSE 213* 255* 348*  BUN 40* 36* 29*  CREATININE 2.70* 2.62* 2.52*  CALCIUM 9.0 8.4* 8.1*   GFR: Estimated Creatinine Clearance: 25.4 mL/min (A) (by C-G formula based on SCr of 2.52 mg/dL (H)). Liver Function Tests: Recent Labs  Lab 04/10/21 1357 04/11/21 0245  AST 22 20  ALT 20 17  ALKPHOS 108 103  BILITOT 1.1 0.8  PROT 7.5 6.8  ALBUMIN 2.8* 2.6*   No results for input(s): LIPASE, AMYLASE in the last 168 hours. No results for input(s): AMMONIA in the last 168 hours. Coagulation Profile: Recent Labs  Lab 04/10/21 1357  INR 1.3*   Cardiac Enzymes: No results for input(s): CKTOTAL, CKMB, CKMBINDEX, TROPONINI in the last 168 hours. BNP (last 3 results) No results for input(s): PROBNP in the  last 8760 hours. HbA1C: Recent Labs    04/10/21 1357  HGBA1C 7.8*   CBG: Recent Labs  Lab 04/11/21 1550 04/11/21 1625 04/11/21 2222 04/12/21 0724 04/12/21 1202  GLUCAP 244* 251* 293* 317* 188*   Lipid Profile: Recent Labs    04/12/21 0429  CHOL 84  HDL 25*  LDLCALC 42  TRIG 87  CHOLHDL 3.4   Thyroid Function Tests: No results for input(s): TSH, T4TOTAL, FREET4, T3FREE, THYROIDAB in the last 72 hours. Anemia Panel: No results for input(s): VITAMINB12, FOLATE, FERRITIN, TIBC, IRON, RETICCTPCT in the last 72 hours. Sepsis Labs: Recent Labs  Lab 04/10/21 1357 04/10/21 1637  LATICACIDVEN 1.9 1.5    Recent Results (from the past 240 hour(s))  Resp Panel by RT-PCR (Flu A&B, Covid) Nasopharyngeal Swab     Status: None   Collection Time: 04/10/21  2:28 PM   Specimen: Nasopharyngeal Swab; Nasopharyngeal(NP) swabs in vial transport medium  Result Value Ref Range Status   SARS Coronavirus 2 by RT PCR NEGATIVE NEGATIVE Final    Comment: (NOTE) SARS-CoV-2 target nucleic acids are NOT DETECTED.  The SARS-CoV-2 RNA is generally detectable in upper respiratory specimens during the acute phase of infection. The lowest concentration of SARS-CoV-2 viral copies this assay can detect is 138 copies/mL. A negative result does not preclude SARS-Cov-2 infection and should not be used as the sole basis for treatment or other patient management decisions. A negative result may occur with  improper specimen collection/handling, submission of specimen other than nasopharyngeal swab, presence of viral mutation(s) within the areas targeted by this assay, and inadequate number of viral copies(<138 copies/mL). A negative result must be combined with clinical observations, patient history, and epidemiological information. The expected result is Negative.  Fact Sheet for Patients:  EntrepreneurPulse.com.au  Fact Sheet for Healthcare Providers:   IncredibleEmployment.be  This test is no t yet approved or cleared by the [place] FDA and  has been authorized for detection and/or diagnosis of SARS-CoV-2 by FDA under an Emergency Use Authorization (EUA). This EUA will remain  in effect (meaning this test can be used) for the duration of the COVID-19 declaration under Section 564(b)(1) of the Act, 21 U.S.C.section 360bbb-3(b)(1), unless the authorization is terminated  or revoked sooner.       Influenza A by PCR NEGATIVE NEGATIVE Final   Influenza B by PCR NEGATIVE NEGATIVE Final    Comment: (NOTE) The Xpert Xpress SARS-CoV-2/FLU/RSV plus assay is intended as an aid in the diagnosis of influenza from Nasopharyngeal swab specimens and should not be used as a sole basis for treatment. Nasal washings and aspirates are unacceptable for Xpert Xpress SARS-CoV-2/FLU/RSV testing.  Fact Sheet for Patients: EntrepreneurPulse.com.au  Fact Sheet for Healthcare Providers: IncredibleEmployment.be  This test is not yet approved or cleared by the [place] FDA and has been authorized for detection and/or diagnosis of SARS-CoV-2 by FDA under an Emergency Use Authorization (EUA). This EUA will remain in effect (meaning this test can be used) for the duration of the COVID-19 declaration under Section 564(b)(1) of the Act, 21 U.S.C.  section 360bbb-3(b)(1), unless the authorization is terminated or revoked.  Performed at [facility], [address] [place], [place] [address]   Blood culture (routine single)     Status: None (Preliminary result)   Collection Time: 04/10/21  2:37 PM   Specimen: BLOOD  Result Value Ref Range Status   Specimen Description BLOOD BLOOD LEFT FOREARM  Final   Special Requests   Final    BOTTLES DRAWN AEROBIC AND ANAEROBIC Blood Culture adequate volume   Culture   Final    NO GROWTH 2 DAYS Performed at [facility], [address], [place], [place] [address]    Report Status PENDING  Incomplete  Culture, blood (single)     Status: None (Preliminary result)   Collection Time: 04/10/21  4:32 PM   Specimen: BLOOD  Result Value Ref Range Status   Specimen Description BLOOD BLOOD RIGHT HAND  Final   Special Requests   Final    BOTTLES DRAWN AEROBIC AND ANAEROBIC Blood Culture adequate volume   Culture   Final    NO GROWTH 2 DAYS Performed at [facility], [address], [place], [place] [address]    Report Status PENDING  Incomplete  Aerobic/Anaerobic Culture w Gram Stain (surgical/deep wound)     Status: None (Preliminary result)   Collection Time: 04/10/21  8:34 PM   Specimen: PATH Other; Wound  Result Value Ref Range Status   Specimen Description   Final    FOOT LEFT Performed at [facility], [address] [place], [place] [address]    Special Requests   Final    NONE Performed at [facility], [place], [place] [address]    Gram Stain   Final    FEW SQUAMOUS EPITHELIAL CELLS PRESENT FEW WBC SEEN FEW GRAM POSITIVE RODS MODERATE GRAM NEGATIVE RODS MODERATE GRAM POSITIVE COCCI    Culture   Final    CULTURE REINCUBATED FOR BETTER GROWTH Performed at [facility], [place] [address] [place], [place] [address]    Report Status PENDING  Incomplete         Radiology Studies: MR FOOT LEFT WO CONTRAST  Result Date: 04/11/2021 CLINICAL DATA:  Osteomyelitis suspected, foot swelling, diabetic EXAM: MRI OF THE LEFT FOOT WITHOUT CONTRAST TECHNIQUE: Multiplanar, multisequence MR imaging of the left foot was performed. No intravenous contrast was administered. COMPARISON:  Radiograph dated April 10, 2021 FINDINGS: Bones/Joint/Cartilage Status post amputation of the second digit through the metatarsophalangeal joint. There is hyperintense signal in the second metatarsal head on stir and T2 sequences (series 12, image 21, series 10 image 13). Ligaments  Intact Muscles and Tendons Increased intramuscular signal of the plantar muscles concerning for diabetic myopathy/myositis. No drainable fluid collection or abscess. The visualized tendons are intact Soft tissues Marked subcutaneous soft tissue edema prominent about the dorsum of foot and about the prior amputation site. 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 Hyperintense signal in second metatarsal head, which in the presence of adjacent skin defect is concerning for osteomyelitis. Differential includes reactive marrow edema secondary to postsurgical changes. Marked soft tissue edema about the amputation site and dorsum of the foot. Diabetic myopathy/myositis of the plantar muscles. No drainable fluid collection or abscess. Electronically Signed   By: Keane Police D.O.   On: 04/11/2021 12:27   US ARTERIAL ABI (SCREENING LOWER EXTREMITY)  Result Date: 04/11/2021 CLINICAL DATA:  60 year old female with gangrenous left foot. EXAM: NONINVASIVE PHYSIOLOGIC VASCULAR STUDY OF BILATERAL LOWER EXTREMITIES TECHNIQUE: Evaluation of both lower extremities were performed at rest, including calculation of ankle-brachial indices with single level Doppler, pressure and pulse volume recording. COMPARISON:  None. FINDINGS: Right ABI:  0.90 Left ABI:  0.53 Right Lower Extremity: Biphasic waveforms in the posterior tibial artery, monophasic waveforms in the dorsalis pedis at the level of the ankle. Left Lower Extremity: Monophasic waveforms at the level of the ankle. IMPRESSION: 1. Moderate to severe left lower extremity peripheral artery disease (ABI = 0.53). 2. Mild right lower extremity peripheral artery disease (ABI = 0.90). Ruthann Cancer, MD [facility] Radiology Electronically Signed   By: Ruthann Cancer M.D.   On: 04/11/2021 12:12   DG Foot Complete Left  Result Date:  04/10/2021 CLINICAL DATA:  Postop EXAM: LEFT FOOT - COMPLETE 3+ VIEW COMPARISON:  04/10/2021 FINDINGS: Interval amputation of the second digit at the level of the MTP joint. No acute fracture or malalignment. Resolution of previously noted soft tissue emphysema. IMPRESSION: Interval amputation of the second digit at the level of MTP joint with expected postsurgical change Electronically Signed   By: Donavan Foil M.D.   On: 04/10/2021 21:38        Scheduled Meds:  carvedilol  12.5 mg Oral BID WC   dapagliflozin propanediol  10 mg Oral Daily   digoxin  125 mcg Oral Daily   insulin aspart  0-15 Units Subcutaneous Q4H   insulin aspart  0-5 Units Subcutaneous QHS   insulin glargine-yfgn  20 Units Subcutaneous QHS   NIFEdipine  30 mg Oral Daily   rosuvastatin  10 mg Oral QHS   Continuous Infusions:  linezolid (ZYVOX) IV 600 mg (04/12/21 0338)   piperacillin-tazobactam (ZOSYN)  IV 3.375 g (04/12/21 0518)     LOS: 1 day    Time spent: 39 minutes spent on chart review, discussion with nursing staff, consultants, updating family and interview/physical exam; more than 50% of that time was spent in counseling and/or coordination of care.    Hally Colella J [place] DO [facility] Available via Epic secure chat 7am-7pm After these hours, please refer to coverage provider listed on [email or web address] 04/12/2021, 12:28 PM

## 2021-04-12 NOTE — Interval H&P Note (Signed)
History and Physical Interval Note:  04/12/2021 2:54 PM  Chelsea Barton  has presented today for surgery, with the diagnosis of osteomyelitis.  The various methods of treatment have been discussed with the patient and family. After consideration of risks, benefits and other options for treatment, the patient has consented to  Procedure(s): TRANSMETATARSAL AMPUTATION (Left) as a surgical intervention.  The patient's history has been reviewed, patient examined, no change in status, stable for surgery.  I have reviewed the patient's chart and labs.  Questions were answered to the patient's satisfaction.     Felipa Furnace

## 2021-04-13 ENCOUNTER — Encounter: Payer: Medicaid Other | Admitting: Family

## 2021-04-13 ENCOUNTER — Encounter: Payer: Self-pay | Admitting: Podiatry

## 2021-04-13 ENCOUNTER — Encounter
Admission: EM | Disposition: A | Discharge status: Home Health | Payer: Self-pay | Source: Home / Self Care | Attending: Internal Medicine

## 2021-04-13 ENCOUNTER — Other Ambulatory Visit: Payer: Medicaid Other

## 2021-04-13 DIAGNOSIS — M86172 Other acute osteomyelitis, left ankle and foot: Secondary | ICD-10-CM | POA: Diagnosis not present

## 2021-04-13 DIAGNOSIS — I70262 Atherosclerosis of native arteries of extremities with gangrene, left leg: Secondary | ICD-10-CM

## 2021-04-13 DIAGNOSIS — L0390 Cellulitis, unspecified: Secondary | ICD-10-CM | POA: Diagnosis not present

## 2021-04-13 HISTORY — PX: PERIPHERAL VASCULAR BALLOON ANGIOPLASTY: CATH118281

## 2021-04-13 LAB — BASIC METABOLIC PANEL
Anion gap: 7 (ref 5–15)
BUN: 22 mg/dL (ref 8–23)
CO2: 25 mmol/L (ref 22–32)
Calcium: 8 mg/dL — ABNORMAL LOW (ref 8.9–10.3)
Chloride: 106 mmol/L (ref 98–111)
Creatinine, Ser: 2.4 mg/dL — ABNORMAL HIGH (ref 0.44–1.00)
GFR, Estimated: 22 mL/min — ABNORMAL LOW (ref >60)
Glucose, Bld: 127 mg/dL — ABNORMAL HIGH (ref 70–99)
Potassium: 4 mmol/L (ref 3.5–5.1)
Sodium: 138 mmol/L (ref 135–145)

## 2021-04-13 LAB — CBC
HCT: 34.9 % — ABNORMAL LOW (ref 36.0–46.0)
Hemoglobin: 11.3 g/dL — ABNORMAL LOW (ref 12.0–15.0)
MCH: 27.8 pg (ref 26.0–34.0)
MCHC: 32.4 g/dL (ref 30.0–36.0)
MCV: 86 fL (ref 80.0–100.0)
Platelets: 274 10*3/uL (ref 150–400)
RBC: 4.06 MIL/uL (ref 3.87–5.11)
RDW: 13.6 % (ref 11.5–15.5)
WBC: 16.1 10*3/uL — ABNORMAL HIGH (ref 4.0–10.5)
nRBC: 0 % (ref 0.0–0.2)

## 2021-04-13 LAB — GLUCOSE, CAPILLARY
Glucose-Capillary: 123 mg/dL — ABNORMAL HIGH (ref 70–99)
Glucose-Capillary: 126 mg/dL — ABNORMAL HIGH (ref 70–99)
Glucose-Capillary: 128 mg/dL — ABNORMAL HIGH (ref 70–99)
Glucose-Capillary: 142 mg/dL — ABNORMAL HIGH (ref 70–99)
Glucose-Capillary: 162 mg/dL — ABNORMAL HIGH (ref 70–99)
Glucose-Capillary: 167 mg/dL — ABNORMAL HIGH (ref 70–99)
Glucose-Capillary: 92 mg/dL (ref 70–99)

## 2021-04-13 SURGERY — PERIPHERAL VASCULAR BALLOON ANGIOPLASTY
Anesthesia: Moderate Sedation | Laterality: Left

## 2021-04-13 MED ORDER — HEPARIN SODIUM (PORCINE) 1000 UNIT/ML IJ SOLN
INTRAMUSCULAR | Status: DC | PRN
  Administered 2021-04-13: 4000 [IU] via INTRAVENOUS

## 2021-04-13 MED ORDER — SODIUM CHLORIDE 0.9% FLUSH
3.0000 mL | Freq: Two times a day (BID) | INTRAVENOUS | Status: DC
  Administered 2021-04-14 – 2021-04-24 (×19): 3 mL via INTRAVENOUS

## 2021-04-13 MED ORDER — SODIUM CHLORIDE 0.9 % IV SOLN: INTRAVENOUS | Status: AC

## 2021-04-13 MED ORDER — FENTANYL CITRATE (PF) 100 MCG/2ML IJ SOLN
INTRAMUSCULAR | Status: DC | PRN
  Administered 2021-04-13 (×2): 25 ug via INTRAVENOUS

## 2021-04-13 MED ORDER — CEFAZOLIN SODIUM-DEXTROSE 1-4 GM/50ML-% IV SOLN
INTRAVENOUS | Status: DC | PRN
  Administered 2021-04-13: 1 g via INTRAVENOUS

## 2021-04-13 MED ORDER — PIPERACILLIN-TAZOBACTAM 3.375 G IVPB
3.3750 g | Freq: Three times a day (TID) | INTRAVENOUS | Status: DC
  Administered 2021-04-13 – 2021-04-19 (×17): 3.375 g via INTRAVENOUS
  Filled 2021-04-13 (×18): qty 50

## 2021-04-13 MED ORDER — HYDRALAZINE HCL 20 MG/ML IJ SOLN
INTRAMUSCULAR | Status: DC | PRN
  Administered 2021-04-13 (×2): 10 mg via INTRAVENOUS

## 2021-04-13 MED ORDER — IODIXANOL 320 MG/ML IV SOLN
INTRAVENOUS | Status: DC | PRN
  Administered 2021-04-13: 75 mL via INTRA_ARTERIAL

## 2021-04-13 MED ORDER — SODIUM CHLORIDE 0.9% FLUSH
3.0000 mL | INTRAVENOUS | Status: DC | PRN
  Administered 2021-04-20: 3 mL via INTRAVENOUS

## 2021-04-13 MED ORDER — CLOPIDOGREL BISULFATE 75 MG PO TABS
75.0000 mg | ORAL_TABLET | Freq: Every day | ORAL | Status: DC
  Administered 2021-04-14 – 2021-04-24 (×11): 75 mg via ORAL
  Filled 2021-04-13 (×12): qty 1

## 2021-04-13 MED ORDER — MIDAZOLAM HCL 5 MG/5ML IJ SOLN
INTRAMUSCULAR | Status: AC
  Filled 2021-04-13: qty 5

## 2021-04-13 MED ORDER — PROPOFOL 500 MG/50ML IV EMUL
INTRAVENOUS | Status: AC
  Filled 2021-04-13: qty 450

## 2021-04-13 MED ORDER — MORPHINE SULFATE (PF) 4 MG/ML IV SOLN
2.0000 mg | INTRAVENOUS | Status: DC | PRN
Start: 2021-04-13 — End: 2021-04-24

## 2021-04-13 MED ORDER — CLOPIDOGREL BISULFATE 75 MG PO TABS
300.0000 mg | ORAL_TABLET | Freq: Once | ORAL | Status: AC
  Administered 2021-04-13: 300 mg via ORAL
  Filled 2021-04-13: qty 4

## 2021-04-13 MED ORDER — CEFAZOLIN SODIUM-DEXTROSE 1-4 GM/50ML-% IV SOLN: 1.0000 g | INTRAVENOUS | Status: AC

## 2021-04-13 MED ORDER — HEPARIN SODIUM (PORCINE) 1000 UNIT/ML IJ SOLN
INTRAMUSCULAR | Status: AC
  Filled 2021-04-13: qty 1

## 2021-04-13 MED ORDER — HYDRALAZINE HCL 20 MG/ML IJ SOLN
INTRAMUSCULAR | Status: AC
  Filled 2021-04-13: qty 1

## 2021-04-13 MED ORDER — PROPOFOL 500 MG/50ML IV EMUL
INTRAVENOUS | Status: AC
  Filled 2021-04-13: qty 50

## 2021-04-13 MED ORDER — INSULIN ASPART 100 UNIT/ML IJ SOLN
0.0000 [IU] | Freq: Three times a day (TID) | INTRAMUSCULAR | Status: DC
  Administered 2021-04-14: 5 [IU] via SUBCUTANEOUS
  Administered 2021-04-14: 3 [IU] via SUBCUTANEOUS
  Administered 2021-04-14 – 2021-04-15 (×2): 2 [IU] via SUBCUTANEOUS
  Administered 2021-04-15 – 2021-04-17 (×7): 3 [IU] via SUBCUTANEOUS
  Administered 2021-04-17: 5 [IU] via SUBCUTANEOUS
  Administered 2021-04-18: 3 [IU] via SUBCUTANEOUS
  Administered 2021-04-18 – 2021-04-19 (×4): 2 [IU] via SUBCUTANEOUS
  Administered 2021-04-20: 5 [IU] via SUBCUTANEOUS
  Administered 2021-04-20 – 2021-04-21 (×5): 3 [IU] via SUBCUTANEOUS
  Administered 2021-04-22: 2 [IU] via SUBCUTANEOUS
  Administered 2021-04-22 (×2): 8 [IU] via SUBCUTANEOUS
  Administered 2021-04-23 – 2021-04-24 (×3): 5 [IU] via SUBCUTANEOUS
  Filled 2021-04-13 (×29): qty 1

## 2021-04-13 MED ORDER — MIDAZOLAM HCL 2 MG/2ML IJ SOLN
INTRAMUSCULAR | Status: DC | PRN
  Administered 2021-04-13 (×2): 1 mg via INTRAVENOUS

## 2021-04-13 MED ORDER — SODIUM CHLORIDE 0.9 % IV SOLN
250.0000 mL | INTRAVENOUS | Status: DC | PRN
  Administered 2021-04-13 – 2021-04-21 (×3): 250 mL via INTRAVENOUS

## 2021-04-13 MED ORDER — LACTATED RINGERS IV SOLN: INTRAVENOUS | Status: DC

## 2021-04-13 MED ORDER — FENTANYL CITRATE PF 50 MCG/ML IJ SOSY
PREFILLED_SYRINGE | INTRAMUSCULAR | Status: AC
  Filled 2021-04-13: qty 1

## 2021-04-13 SURGICAL SUPPLY — 26 items
BALLN LUTONIX 5X220X130 (BALLOONS) ×2
BALLN LUTONIX DCB 5X100X130 (BALLOONS) ×2
BALLOON LUTONIX 5X220X130 (BALLOONS) IMPLANT
BALLOON LUTONIX DCB 5X100X130 (BALLOONS) IMPLANT
CATH ANGIO 5F PIGTAIL 65CM (CATHETERS) ×1 IMPLANT
CATH CROSSER 14S OTW 146CM (CATHETERS) ×1 IMPLANT
CATH SKICK STR 70CM (CATHETERS) ×1 IMPLANT
CATH TEMPO 5F RIM 65CM (CATHETERS) ×1 IMPLANT
CATH VERT 5X100 (CATHETERS) ×1 IMPLANT
COVER PROBE U/S 5X48 (MISCELLANEOUS) ×1 IMPLANT
DEVICE STARCLOSE SE CLOSURE (Vascular Products) ×1 IMPLANT
GLIDEWIRE ADV .035X260CM (WIRE) ×1 IMPLANT
KIT ENCORE 26 ADVANTAGE (KITS) ×1 IMPLANT
KIT FLOWMATE PROCEDURAL (KITS) ×1 IMPLANT
LIFESTENT SOLO 6X200X135 (Permanent Stent) ×1 IMPLANT
NDL ENTRY 21GA 7CM ECHOTIP (NEEDLE) IMPLANT
NEEDLE ENTRY 21GA 7CM ECHOTIP (NEEDLE) ×2 IMPLANT
PACK ANGIOGRAPHY (CUSTOM PROCEDURE TRAY) ×1 IMPLANT
SET INTRO CAPELLA COAXIAL (SET/KITS/TRAYS/PACK) ×1 IMPLANT
SHEATH ANL2 6FRX45 HC (SHEATH) ×1 IMPLANT
SHEATH BRITE TIP 5FRX11 (SHEATH) ×1 IMPLANT
STENT LIFESTENT 5F 6X80X135 (Permanent Stent) ×1 IMPLANT
SYR MEDRAD MARK 7 150ML (SYRINGE) ×1 IMPLANT
TUBING CONTRAST HIGH PRESS 72 (TUBING) ×1 IMPLANT
WIRE GUIDERIGHT .035X150 (WIRE) ×1 IMPLANT
WIRE MAGIC TORQUE 315CM (WIRE) ×1 IMPLANT

## 2021-04-13 NOTE — Progress Notes (Signed)
Patient suffers from non weight bearing on left leg which impairs their ability to perform daily activities like ADLs  in the home.  A walking aide  will not resolve issue with performing activities of daily living. A wheelchair will allow patient to safely perform daily activities. Patient is not able to propel themselves in the home using a standard weight wheelchair due to weakness Patient can self propel in the lightweight wheelchair. Length of need 6 months. Accessories: left elevating leg rests ELRs, wheel locks, extensions and anti-tippers, back cushion.

## 2021-04-13 NOTE — Op Note (Signed)
[facility]  Percutaneous Study/Intervention Procedural Note   Date of Surgery: 04/13/2021  Surgeon:  Katha Cabal, MD.  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  Introduction catheter into left lower extremity 3rd order catheter placement              2.  Contrast injection left lower extremity for distal runoff             3.  Crosser atherectomy of the left SFA              4.   Percutaneous transluminal angioplasty and stent placement left superficial femoral artery              5.  Star close closure right common femoral arteriotomy  Anesthesia: Conscious sedation was administered by the radiology RN under my direct supervision. IV Versed plus fentanyl were utilized. Continuous ECG, pulse oximetry and blood pressure was monitored throughout the entire procedure. Conscious sedation was for a total of 58 minutes.  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  She has undergone debridement and surgery.  Pedal pulses are nonpalpable and there is concern for severe atherosclerotic occlusive disease and I am asked to treat.  Angiography with hope for intervention for limb salvage was discussed.  The risks and benefits are reviewed all questions answered patient agrees to proceed.  Procedure:  Chelsea Barton is a 60 y.o. y.o. female who was identified and appropriate procedural time out was performed.  The patient was then placed supine on the table and prepped and draped in the usual sterile fashion.    Ultrasound was placed in the sterile sleeve and the right groin was evaluated the right common femoral artery was echolucent and pulsatile indicating  patency.  Image was recorded for the permanent record and under real-time visualization a microneedle was inserted into the common femoral artery microwire followed by a micro-sheath.  A J-wire was then advanced through the micro-sheath and a  5 [place] sheath was then inserted over a J-wire. J-wire was then advanced and a 5 French pigtail catheter was positioned at the level of T12. AP projection of the aorta was then obtained. Pigtail catheter was repositioned to above the bifurcation and a RAO and LAO views of the pelvis were obtained.  Subsequently a rim catheter with the stiff angle Glidewire was used to cross the aortic bifurcation the catheter wire were advanced down into the left distal external iliac artery. Oblique view of the femoral bifurcation was then obtained and subsequently the wire was reintroduced and the pigtail catheter negotiated into the SFA representing third order catheter placement. Distal runoff was then performed.  Diagnostic interpretation: The abdominal aorta is opacified with a bolus injection contrast.  There are no hemodynamically significant lesions or stenoses.  The aortic bifurcation demonstrates increasing disease and on the right there is a greater than 80% stenosis at the ostia of the common iliac artery.  There is poststenotic dilatation noted in the midportion of the common iliac distally common iliac is widely patent as is the right external and internal iliac arteries.  On the left there appears to be a eccentric lesion of approximately  60%.  This extends from the ostia down to the midportion of the left common iliac artery.  Distally the artery is widely patent.  The origin of the left internal iliac artery demonstrates a greater than 80% stenosis but otherwise the artery is widely patent.  The external iliac artery on the left is diseased but widely patent.  The left common femoral is moderately diseased with a 40 to 50% diffuse stenosis throughout its length.  The  profunda femoris is widely patent.  The superficial femoral artery demonstrates increasing disease at its origin extending through the proximal one third with 2 focal lesions approximately 10 cm apart of greater than 80%.  In the midportion of the SFA there is an occlusion and this occlusion extends over a distance of approximately 10 cm.  There is reconstitution at Hunter's canal and the popliteal artery although diffusely diseased is patent.  Trifurcation is patent with occlusion of the anterior tibial shortly after its origin and this does not appear to reconstitute.  Tibioperoneal trunk is patent and the peroneal and posterior tibial arteries are patent with the posterior tibial being the dominant runoff to the foot filling the plantar vessels.  5000 units of heparin was then given and allowed to circulate and a 6 [place] Ansell sheath was advanced up and over the bifurcation and positioned in the distal left common femoral artery  The 38 S Crosser catheter was then prepped on the field and a straight side kick catheter was advanced into the cul-de-sac of the SFA under magnified imaging in the LAO projection. Using the Crosser catheter the occlusion of the SFA and popliteal was negotiated.  Injection of contrast through the Crosser side port confirmed intraluminal positioning and successful recanalization of the SFA.  Kumpe catheter and advantage Glidewire were then advanced down into the distal popliteal.  The advantage wire was then exchanged for a Magic torque wire.  Given her renal insufficiency and the occlusion with the associated diffuse long segment high-grade stenoses I elected to primarily stent this lesion  A 6 mm x 200 mm life stent was then deployed with its distal edge just beyond Hunter's canal extending proximally a second 6 mm x 80 mm life stent was deployed up to the origin of the SFA.  The stents overlapped by approximately 10 mm.  They were postdilated with a 5 mm Lutonix drug-eluting  balloons each inflation was to 8 atm for approximately 1 minute.  Follow-up imaging now demonstrated wide patency of the SFA with less than 10% residual stenosis.  Distal runoff was preserved.  After review of these images the sheath is pulled into the right external iliac oblique of the common femoral is obtained and a Star close device deployed. There no immediate Complications.  Findings:   The abdominal aorta is opacified with a bolus injection contrast.  There are no hemodynamically significant lesions or stenoses.  The aortic bifurcation demonstrates increasing disease and on the right there is a greater than 80% stenosis at the ostia of the common iliac artery.  There is poststenotic dilatation noted in the midportion of the common iliac distally common iliac is widely patent as is the right external and internal iliac arteries.  On the left there appears to be a eccentric lesion of approximately 60%.  This extends from the ostia down to the midportion of the left common iliac artery.  Distally the artery is widely patent.  The origin of the left internal iliac artery demonstrates a greater than 80%  stenosis but otherwise the artery is widely patent.  The external iliac artery on the left is diseased but widely patent.  The left common femoral is moderately diseased with a 40 to 50% diffuse stenosis throughout its length.  The profunda femoris is widely patent.  The superficial femoral artery demonstrates increasing disease at its origin extending through the proximal one third with 2 focal lesions approximately 10 cm apart of greater than 80%.  In the midportion of the SFA there is an occlusion and this occlusion extends over a distance of approximately 10 cm.  There is reconstitution at Hunter's canal and the popliteal artery although diffusely diseased is patent.  Trifurcation is patent with occlusion of the anterior tibial shortly after its origin and this does not appear to reconstitute.   Tibioperoneal trunk is patent and the peroneal and posterior tibial arteries are patent with the posterior tibial being the dominant runoff to the foot filling the plantar vessels.  Following crosser arthrectomy there is now recanalization of the SFA with contra formation of intraluminal positioning.  Following stent placement with angioplasty using 5 mm Lutonix drug-eluting balloons there is now wide patency of the SFA filling of the popliteal with less than 10% residual stenosis.  Distal runoff is preserved.  Given her severe renal insufficiency I will elect to treat her right common iliac lesion in association with the left iliac at another time to minimize contrast exposure.                           Disposition: Patient was taken to the recovery room in stable condition having tolerated the procedure well.  Chelsea Barton 04/13/2021,2:16 PM

## 2021-04-13 NOTE — Progress Notes (Addendum)
Inpatient Diabetes Program  AACE/ADA: New Consensus Statement on Inpatient Glycemic Control  Target Ranges:  Prepandial:   less than 140 mg/dL      Peak postprandial:   less than 180 mg/dL (1-2 hours)      Critically ill patients:  140 - 180 mg/dL   Results for NILLIE, BARTOLOTTA (MRN [id number]) as of 04/13/2021 14:00  Ref. Range 04/13/2021 00:51 04/13/2021 05:02 04/13/2021 08:17 04/13/2021 10:41  Glucose-Capillary Latest Ref Range: 70 - 99 mg/dL 167 (H) 126 (H) 162 (H) 128 (H)  Results for CYTHINA, MICKELSEN (MRN [id number]) as of 04/13/2021 14:00  Ref. Range 04/12/2021 12:02 04/12/2021 15:36 04/12/2021 16:58 04/12/2021 17:46 04/12/2021 20:14  Glucose-Capillary Latest Ref Range: 70 - 99 mg/dL 188 (H) 107 (H) 117 (H) 131 (H) 158 (H)    NOTE: Diabetes coordinator team was paged on 04/11/21 by Vesta Mixer, RN regarding patient's Dexcom CGM being removed prior to MRI and patient requested replacement Dexcom sensor. RN was informed that inpatient diabetes team did not have any Dexcom CGM sensors but we did have samples of FreeStyle Libre2 CGMs (would need provider order to give sample). Sent communication to Dr. [place] requesting permission to give FreeStyle Libre2 sensor to replace Dexcom CGM that had been removed on 04/11/21. Dr. [place] provided order to give FreeStyle Libre2 sensor sample. Diabetes coordinator will plan to provide patient with Alveta Heimlich sensor prior to discharge. Would also recommend patient contact PCP if she needs refills for Dexcom CGM and also to call Dexcom customer service to request a replacement sensor for the one removed prior to MRI.   Thanks, Barnie Alderman, RN, MSN, CDE Diabetes Coordinator Inpatient Diabetes Program [phone] (Team Pager from 8am to 5pm)

## 2021-04-13 NOTE — Progress Notes (Addendum)
PROGRESS NOTE    Chelsea Barton  XMI:[id number] DOB: Feb 08, 1961 DOA: 04/10/2021 PCP: Burnard Hawthorne, FNP    Brief Narrative:  Chelsea Barton is a 60 year old female with past medical history significant for type 2 diabetes mellitus, CKD stage IV, essential hypertension, hyperlipidemia, LVH, mitral insufficiency, secondary hyperparathyroidism, hyperlipidemia who presented to [facility] ED on 10/25 with left foot pain and swelling.  Onset roughly 2 days ago.  Denies any trauma.   In the ED, temperature 98.4 F, HR 72, RR 18, BP 140/55, SPO2 97% on room air.  Sodium 138, potassium 4.1, chloride 102, CO2 26, glucose 213, BUN 40, creatinine 2.70.  Lactic acid 1.9.  WBC 26.1, hemoglobin 11.3, platelets 243.  INR 1.3.  COVID-19 PCR negative.  Influenza A/B PCR negative.  X-ray left foot with comminuted and displaced second proximal phalanx fracture, soft tissue emphysema involving the digits and forefoot around the fracture suggestive of necrotizing soft tissue infection.  Podiatry was consulted.  Patient was started empiric antibiotics.  TRH consulted for further evaluation and management of left foot diabetic infection.   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 X-ray left foot with comminuted and displaced second proximal phalanx fracture, soft tissue emphysema involving the digits and forefoot around the fracture suggestive of  necrotizing soft tissue infection.  Patient underwent incision and drainage and left second toe amputation by podiatry, Dr. Amalia Hailey on 04/10/2021.  MRI left foot with hyperintense signal Sica metatarsal head with adjacent skin defect concerning for vasculitis, marked soft tissue edema about amputation site, diabetic myopathy/myositis of the plantar muscles without drainable fluid collection/abscess.  Underwent transmetatarsal amputation by podiatry, Dr. Posey Pronto on 04/12/2021. --Podiatry following, appreciate assistance --WBC 261>19.7>19.5>16.1 --ESR 72 --CRP 27.8 --Blood cultures x2: no growth x 3 days --Operative culture 10/25: few GPCs, Mod GNRs, Mod GPCs --Zyvox 600 mg IV q12h --Zosyn 3.$RemoveBefore'375mg'[id number]$  IV q8h --Oxycodone 5 mg q4h prn moderate pain --Morphine 2 mg IV q2h PRN severe pain --Not weightbearing left lower extremity with cam walker --CBC daily  Peripheral vascular disease Ultrasound arterial ABI 10/26 with moderate to severe left lower extremity peripheral artery disease with ABI 0.53 and mild right lower extremity peripheral artery disease with ABI 0.90. --Vascular following, appreciate assistance --Plan angiogram with possible intervention today  Type 2 diabetes mellitus with hyperglycemia Hemoglobin A1c 7.8 and 04/10/2021, not optimally controlled.  Home regimen includes Lantus 40 units apparently daily, Farxiga 10 mg p.o. daily. --Semglee 20u [place] QHS --NovoLog SSI for further coverage --CBGs qAC/HS  Acute renal failure on CKD stage IV Creatinine elevated 2.70 on admission, baseline 2.3-2.4.  Etiology likely secondary to acute infectious process as above. --Cr 2.70>2.62>2.52>2.40  --Holding home enalapril --Avoid nephrotoxins, renal dose all medications --BMP daily  Essential hypertension Home regimen includes carvedilol 12.5 mg p.o. twice daily, enalapril 40 mg p.o. daily, digoxin 0.15 mg p.o. daily, nifedipine 30 mg p.o. daily, hydralazine prn. --Carvedilol 12.5 mg p.o. twice  daily --Digoxin 0.125 mg  p.o. daily --Holding enalapril due to AKI as above.  Hyperlipidemia HDL 25, LDL 42. --continue home Crestor 10 mg p.o. daily  Obesity Body mass index is 29.95 kg/m.  Discussed with patient needs for aggressive lifestyle changes/weight loss as this complicates all facets of care.  Outpatient follow-up with PCP.     DVT prophylaxis: SCDs Start: 04/10/21 2138 Place TED hose Start: 04/10/21 1455   Code Status: Full Code Family Communication: Updated patients sister present at bedside this morning  Disposition Plan:  Level of care: Med-Surg Status is: Inpatient  Remains inpatient appropriate because: Continued surgical intervention, IV antibiotics    Consultants:  Podiatry, Dr. Amalia Hailey Vascular surgery  Procedures:  Incision and drainage left foot with left second toe amputation, podiatry Dr. Amalia Hailey 10/25 Left foot transmetatarsal amputation, podiatry Dr. Posey Pronto 10/27.  Antimicrobials:  Zyvox 10/25>> Zosyn 10/25>> Vancomycin 10/25 - 10/25 Clindamycin 10/25 - 10/25     Subjective: Patient seen examined bedside, resting comfortably.  Awaiting for angiogram later today.  Sister present at bedside.  No specific complaints this morning. Denies headache, no current fever/chills/night sweats, no nausea/vomiting/diarrhea, no chest pain, palpitations, no abdominal pain, no weakness, no fatigue, no paresthesias.  No acute events overnight per nurse staff.  Objective: Vitals:   04/13/21 1250 04/13/21 1255 04/13/21 1300 04/13/21 1305  BP: (!) 189/100 (!) 189/86 (!) 200/105 (!) 195/101  Pulse: 67 68 68 72  Resp: 20 18 20    Temp:      TempSrc:      SpO2: 94% 93% 93% 95%  Weight:      Height:        Intake/Output Summary (Last 24 hours) at 04/13/2021 1313 Last data filed at 04/12/2021 1657 Gross per 24 hour  Intake 600 ml  Output 10 ml  Net 590 ml   Filed Weights   04/10/21 1242 04/12/21 1537 04/13/21 1040  Weight: 86.2 kg 86.2 kg 81.6 kg     Examination:  General exam: Appears calm and comfortable, obese Respiratory system: Clear to auscultation. Respiratory effort normal.  On room air Cardiovascular system: S1 & S2 heard, RRR. No JVD, murmurs, rubs, gallops or clicks. No pedal edema. Gastrointestinal system: Abdomen is nondistended, soft and nontender. No organomegaly or masses felt. Normal bowel sounds heard. Central nervous system: Alert and oriented. No focal neurological deficits. Extremities: Symmetric 5 x 5 power.  Left foot with dressing/Ace wrap in place, clean/dry/intact Skin: Left foot wound with dressing in place, clean/dry/intact; otherwise no rashes, lesions or ulcers Psychiatry: Judgement and insight appear normal. Mood & affect appropriate.     Data Reviewed: I have personally reviewed following labs and imaging studies  CBC: Recent Labs  Lab 04/10/21 1357 04/11/21 0245 04/12/21 0429 04/13/21 0449  WBC 26.1* 19.7* 19.5* 16.1*  NEUTROABS 21.5*  --   --   --   HGB 11.3* 11.2* 11.2* 11.3*  HCT 35.6* 35.1* 35.1* 34.9*  MCV 87.9 86.5 84.8 86.0  PLT 243 247 259 882   Basic Metabolic Panel: Recent Labs  Lab 04/10/21 1357 04/11/21 0245 04/12/21 0429 04/13/21 0449  NA 138 136 136 138  K 4.1 3.8 3.9 4.0  CL 102 106 102 106  CO2 26 26 25 25   GLUCOSE 213* 255* 348* 127*  BUN 40* 36* 29* 22  CREATININE 2.70* 2.62* 2.52* 2.40*  CALCIUM 9.0 8.4* 8.1* 8.0*   GFR: Estimated Creatinine Clearance: 26 mL/min (A) (by C-G formula based on SCr of 2.4 mg/dL (H)). Liver Function Tests: Recent Labs  Lab 04/10/21 1357 04/11/21 0245  AST 22 20  ALT 20 17  ALKPHOS 108 103  BILITOT 1.1 0.8  PROT 7.5 6.8  ALBUMIN 2.8* 2.6*   No results for input(s): LIPASE, AMYLASE in the last 168 hours. No results for input(s): AMMONIA in the last 168 hours. Coagulation Profile: Recent Labs  Lab 04/10/21 1357  INR 1.3*   Cardiac Enzymes: No results for input(s): CKTOTAL, CKMB, CKMBINDEX, TROPONINI in the last  168 hours. BNP (last 3 results) No results for input(s): PROBNP in the last 8760 hours. HbA1C: Recent Labs    04/10/21 1357  HGBA1C 7.8*   CBG: Recent Labs  Lab 04/12/21 2014 04/13/21 0051 04/13/21 0502 04/13/21 0817 04/13/21 1041  GLUCAP 158* 167* 126* 162* 128*   Lipid Profile: Recent Labs    04/12/21 0429  CHOL 84  HDL 25*  LDLCALC 42  TRIG 87  CHOLHDL 3.4   Thyroid Function Tests: No results for input(s): TSH, T4TOTAL, FREET4, T3FREE, THYROIDAB in the last 72 hours. Anemia Panel: No results for input(s): VITAMINB12, FOLATE, FERRITIN, TIBC, IRON, RETICCTPCT in the last 72 hours. Sepsis Labs: Recent Labs  Lab 04/10/21 1357 04/10/21 1637  LATICACIDVEN 1.9 1.5    Recent Results (from the past 240 hour(s))  Resp Panel by RT-PCR (Flu A&B, Covid) Nasopharyngeal Swab     Status: None   Collection Time: 04/10/21  2:28 PM   Specimen: Nasopharyngeal Swab; Nasopharyngeal(NP) swabs in vial transport medium  Result Value Ref Range Status   SARS Coronavirus 2 by RT PCR NEGATIVE NEGATIVE Final    Comment: (NOTE) SARS-CoV-2 target nucleic acids are NOT DETECTED.  The SARS-CoV-2 RNA is generally detectable in upper respiratory specimens during the acute phase of infection. The lowest concentration of SARS-CoV-2 viral copies this assay can detect is 138 copies/mL. A negative result does not preclude SARS-Cov-2 infection and should not be used as the sole basis for treatment or other patient management decisions. A negative result may occur with  improper specimen collection/handling, submission of specimen other than nasopharyngeal swab, presence of viral mutation(s) within the areas targeted by this assay, and inadequate number of viral copies(<138 copies/mL). A negative result must be combined with clinical observations, patient history, and epidemiological information. The expected result is Negative.  Fact Sheet for Patients:   EntrepreneurPulse.com.au  Fact Sheet for Healthcare Providers:  IncredibleEmployment.be  This test is no t yet approved or cleared by the [place] FDA and  has been authorized for detection and/or diagnosis of SARS-CoV-2 by FDA under an Emergency Use Authorization (EUA). This EUA will remain  in effect (meaning this test can be used) for the duration of the COVID-19 declaration under Section 564(b)(1) of the Act, 21 U.S.C.section 360bbb-3(b)(1), unless the authorization is terminated  or revoked sooner.       Influenza A by PCR NEGATIVE NEGATIVE Final   Influenza B by PCR NEGATIVE NEGATIVE Final    Comment: (NOTE) The Xpert Xpress SARS-CoV-2/FLU/RSV plus assay is intended as an aid in the diagnosis of influenza from Nasopharyngeal swab specimens and should not be used as a sole basis for treatment. Nasal washings and aspirates are unacceptable for Xpert Xpress SARS-CoV-2/FLU/RSV testing.  Fact Sheet for Patients: EntrepreneurPulse.com.au  Fact Sheet for Healthcare Providers: IncredibleEmployment.be  This test is not yet approved or cleared by the [place] FDA and has been authorized for detection and/or diagnosis of SARS-CoV-2 by FDA under an Emergency Use Authorization (EUA). This EUA will remain in effect (meaning this test  can be used) for the duration of the COVID-19 declaration under Section 564(b)(1) of the Act, 21 U.S.C. section 360bbb-3(b)(1), unless the authorization is terminated or revoked.  Performed at [facility], [address] [place], [place] [address]   Blood culture (routine single)     Status: None (Preliminary result)   Collection Time: 04/10/21  2:37 PM   Specimen: BLOOD  Result Value Ref Range Status   Specimen Description BLOOD BLOOD LEFT FOREARM  Final   Special Requests   Final    BOTTLES DRAWN AEROBIC AND ANAEROBIC Blood Culture adequate volume    Culture   Final    NO GROWTH 3 DAYS Performed at [facility], [address], [place], [place] [address]    Report Status PENDING  Incomplete  Culture, blood (single)     Status: None (Preliminary result)   Collection Time: 04/10/21  4:32 PM   Specimen: BLOOD  Result Value Ref Range Status   Specimen Description BLOOD BLOOD RIGHT HAND  Final   Special Requests   Final    BOTTLES DRAWN AEROBIC AND ANAEROBIC Blood Culture adequate volume   Culture   Final    NO GROWTH 3 DAYS Performed at [facility], [address], [place], [place] [address]    Report Status PENDING  Incomplete  Aerobic/Anaerobic Culture w Gram Stain (surgical/deep wound)     Status: None (Preliminary result)   Collection Time: 04/10/21  8:34 PM   Specimen: PATH Other; Wound  Result Value Ref Range Status   Specimen Description   Final    FOOT LEFT Performed at [facility], [address] [place], [place] [address]    Special Requests   Final    NONE Performed at [facility], [place], [place] [address]    Gram Stain   Final    FEW SQUAMOUS EPITHELIAL CELLS PRESENT FEW WBC SEEN FEW GRAM POSITIVE RODS MODERATE GRAM NEGATIVE RODS MODERATE GRAM POSITIVE COCCI    Culture   Final    CULTURE REINCUBATED FOR BETTER GROWTH Performed at [facility], [place] [address], [place],  [address]    Report Status PENDING  Incomplete         Radiology Studies: DG Foot Complete Left  Result Date: 04/12/2021 CLINICAL DATA:  Postop EXAM: LEFT FOOT - COMPLETE 3+ VIEW COMPARISON:  04/10/2021 FINDINGS: Interval transmetatarsal amputation of the first through fifth digits. Gas in the soft tissues consistent with recent surgery. No fracture identified. IMPRESSION: Interval transmetatarsal amputation of the first through fifth digits with expected postsurgical change Electronically Signed   By: Donavan Foil M.D.   On: 04/12/2021 19:17        Scheduled  Meds:  hydrALAZINE       [MAR Hold] carvedilol  12.5 mg Oral BID WC   [MAR Hold] dapagliflozin propanediol  10 mg Oral Daily   [MAR Hold] digoxin  125 mcg Oral Daily   fentaNYL       heparin sodium (porcine)       [MAR Hold] insulin aspart  0-15 Units Subcutaneous Q4H   [MAR Hold] insulin aspart  0-5 Units Subcutaneous QHS   [MAR Hold] insulin glargine-yfgn  20 Units Subcutaneous QHS   midazolam       [MAR Hold] NIFEdipine  30 mg Oral Daily   [MAR Hold] rosuvastatin  10 mg Oral QHS   Continuous Infusions:  [MAR Hold]  ceFAZolin (ANCEF) IV  ceFAZolin (ANCEF) IV 1 g (04/13/21 1250)   lactated ringers Stopped (04/13/21 1029)   [MAR Hold] linezolid (ZYVOX) IV 600 mg (04/13/21 0508)   [MAR Hold] piperacillin-tazobactam (ZOSYN)  IV 3.375 g (04/13/21 0618)     LOS: 2 days    Time spent: 37 minutes spent on chart review, discussion with nursing staff, consultants, updating family and interview/physical exam; more than 50% of that time was spent in counseling and/or coordination of care.    Conall Vangorder J [place] DO [facility] Available via Epic secure chat 7am-7pm After these hours, please refer to coverage provider listed on [email or web address] 04/13/2021, 1:13 PM

## 2021-04-13 NOTE — Evaluation (Signed)
Physical Therapy Evaluation Patient Details Name: Chelsea Barton MRN: [id number] DOB: 1961-01-10 Today's Date: 04/13/2021  History of Present Illness  Pt is a 60 y.o. 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 PMH includes HTN, CAD, DM, CKD IV, mitral valve insufficiency.  Clinical Impression  Pt in bed with sister in room during treatment. Pt is oriented x 4, pleasant and cooperative throughout treatment. Pt will have 24x7 support at discharge if returning home, and was independent with all activities and aspects of mobility prior to hospital admission. Pt required littler physical support throughout treatment and was able to maintain NWB precautions with good stability. Pt would benefit from further training with RW and building endurance to hop for short household distances and [facility] training during next treatment session. HHPT is recommended at discharge to improve overall functional mobility. Skilled PT intervention is indicated to address deficits in function, mobility, and to return to PLOF as able.  Skilled PT intervention is indicated to address deficits in function, mobility, and to return to PLOF as able.       Recommendations for follow up therapy are one component of a multi-disciplinary discharge planning process, led by the attending physician.  Recommendations may be updated based on patient status, additional functional criteria and insurance authorization.  Follow Up Recommendations Home health PT    Assistance Recommended at Discharge Intermittent Supervision/Assistance  Functional Status Assessment Patient has had a recent decline in their functional status and demonstrates the ability to make significant improvements in function in a reasonable and predictable amount of time.  Equipment Recommendations  Wheelchair (measurements PT)    Recommendations for Other Services        Precautions / Restrictions Precautions Precautions: Fall Required Braces or Orthoses: Other Brace Other Brace: Cam boot Restrictions Weight Bearing Restrictions: Yes LLE Weight Bearing: Non weight bearing      Mobility  Bed Mobility Overal bed mobility: Modified Independent             General bed mobility comments: MOD I for HOB elevation, bed rails    Transfers Overall transfer level: Needs assistance Equipment used: Rolling walker (2 wheels) Transfers: Sit to/from Stand Sit to Stand: Supervision           General transfer comment: able to maintain NWB status, good balance upon standing with BUE support    Ambulation/Gait Ambulation/Gait assistance: Supervision Gait Distance (Feet): 3 Feet Assistive device: Rolling walker (2 wheels)       General Gait Details: Pt able to hop forward, backward, and side to side without LOB while maintaining WB precautions or demonstrating increased fatigue  Stairs            Wheelchair Mobility    Modified Rankin (Stroke Patients Only)       Balance Overall balance assessment: Needs assistance Sitting-balance support: No upper extremity supported Sitting balance-Leahy Scale: Good Sitting balance - Comments: Able to reach across midline without LOB       Standing balance comment: Requires BUE support 2/2 NWB status                             Pertinent Vitals/Pain Pain Assessment: 0-10 Pain Score: 2  Pain Location: LLE, slight increase with movement Pain Descriptors / Indicators: Aching;Sore Pain Intervention(s): Limited activity within patient's tolerance;Monitored during session;Repositioned    Home Living Family/patient expects to be  discharged to:: Private residence Living Arrangements: Other relatives (sister) Available Help at Discharge: Family Type of Home: Mobile home Home Access: Ramped entrance       Home Layout: One level [place]: Shower seat - built in      Prior  Function Prior Level of Function : Independent/Modified Independent             Mobility Comments: Independent for all mobility without AD ADLs Comments: Independent for bathing, grooming,     Hand Dominance   Dominant Hand: Right    Extremity/Trunk Assessment   Upper Extremity Assessment Upper Extremity Assessment: Overall WFL for tasks assessed    Lower Extremity Assessment Lower Extremity Assessment: Overall WFL for tasks assessed;LLE deficits/detail LLE Sensation: WNL       Communication   Communication: No difficulties  Cognition Arousal/Alertness: Awake/alert Behavior During Therapy: WFL for tasks assessed/performed Overall Cognitive Status: Within Functional Limits for tasks assessed                                 General Comments: AOx4, pleasant and cooperative        General Comments      Exercises Other Exercises Other Exercises: PT edu: precautions, WB status, WC recs for community mobility, etc   Assessment/Plan    PT Assessment Patient needs continued PT services  PT Problem List Decreased range of motion;Decreased strength;Decreased activity tolerance;Decreased balance;Decreased mobility;Decreased coordination       PT Treatment Interventions Gait training;Stair training;Functional mobility training;Therapeutic activities;Wheelchair mobility training;Therapeutic exercise    PT Goals (Current goals can be found in the Care Plan section)  Acute Rehab PT Goals Patient Stated Goal: To go home PT Goal Formulation: With patient Time For Goal Achievement: 04/27/21 Potential to Achieve Goals: Good    Frequency 7X/week   Barriers to discharge        Co-evaluation               AM-PAC PT "6 Clicks" Mobility  Outcome Measure Help needed turning from your back to your side while in a flat bed without using bedrails?: None Help needed moving from lying on your back to sitting on the side of a flat bed without using  bedrails?: A Little Help needed moving to and from a bed to a chair (including a wheelchair)?: A Little Help needed standing up from a chair using your arms (e.g., wheelchair or bedside chair)?: A Little Help needed to walk in hospital room?: A Lot Help needed climbing 3-5 steps with a railing? : A Lot 6 Click Score: 17    End of Session Equipment Utilized During Treatment: Gait belt Activity Tolerance: Patient tolerated treatment well Patient left: in chair;with family/visitor present;with call bell/phone within reach;with chair alarm set Nurse Communication: Mobility status PT Visit Diagnosis: Other abnormalities of gait and mobility (R26.89);Muscle weakness (generalized) (M62.81)    Time: 9509-3267 PT Time Calculation (min) (ACUTE ONLY): 30 min   Charges:              [employer], SPT

## 2021-04-13 NOTE — Interval H&P Note (Signed)
History and Physical Interval Note:  04/13/2021 12:33 PM  Chelsea Barton  has presented today for surgery, with the diagnosis of atherosclerosis with gangrene.  The various methods of treatment have been discussed with the patient and family. After consideration of risks, benefits and other options for treatment, the patient has consented to  Procedure(s): PERIPHERAL VASCULAR BALLOON ANGIOPLASTY (Left) as a surgical intervention.  The patient's history has been reviewed, patient examined, no change in status, stable for surgery.  I have reviewed the patient's chart and labs.  Questions were answered to the patient's satisfaction.     [name]

## 2021-04-13 NOTE — Op Note (Signed)
Surgeon: Surgeon(s): Felipa Furnace, DPM  Assistants: None Pre-operative diagnosis: osteomyelitis  Post-operative diagnosis: same Procedure: Procedure(s) (LRB): TRANSMETATARSAL AMPUTATION (Left)  Pathology:  ID Type Source Tests Collected by Time Destination  1 : left transmetatarsal amputation Tissue PATH Amputaion Arm/Leg SURGICAL PATHOLOGY Felipa Furnace, DPM 04/12/2021 1634     Pertinent Intra-op findings: Osteomyelitis present of the second metatarsal head as necrotic tissue/fibrotic tissue noted in the wound bed Anesthesia: Choice 10 cc of one-to-one mixture 1% lidocaine plain half percent Marcaine plain Hemostasis: None EBL: 10 mL  Materials: 3-0 Prolene and 2-0 Prolene Injectables: None Complications: None  Indications for surgery: A 60 y.o. female presents with left foot gas infection with underlying osteomyelitis of the second digit. Patient has failed all conservative therapy including but not limited to local wound care and IV antibiotics. She wishes to have surgical correction of the foot/deformity. It was determined that patient would benefit from left transmetatarsal amputation with primary closure. Informed surgical risk consent was reviewed and read aloud to the patient.  I reviewed the films.  I have discussed my findings with the patient in great detail.  I have discussed all risks including but not limited to infection, stiffness, scarring, limp, disability, deformity, damage to blood vessels and nerves, numbness, poor healing, need for braces, arthritis, chronic pain, amputation, death.  All benefits and realistic expectations discussed in great detail.  I have made no promises as to the outcome.  I have provided realistic expectations.  I have offered the patient a 2nd opinion, which they have declined and assured me they preferred to proceed despite the risks   Procedure in detail: The patient was both verbally and visually identified by myself, the nursing staff, and  anesthesia staff in the preoperative holding area. They were then transferred to the operating room and placed on the operative table in supine position.  Attention was directed to the forefoot a fishmouth style incision was delineated using skin marker.  Using a #10 blade the incision was carried down to the level of the bone.  Using sagittal saw transmetatarsal bone cuts were made across the first second and third fourth and fifth.  Care was taken to maintain the parabola.  The metatarsal was amputated at hard indurated bone.  The entire forefoot was sent to pathology in standard technique.  The wound appeared clear of infection and was determined that patient would benefit from primary closure. The wound was thoroughly irrigated with normal saline solution with 3 L and pulse lavage.  All bleeders were ligated or bovied as needed.   At this time the wound was primarily closed with 3-0 Prolene and skin staples.  The wound was dressed with Betadine wet-to-dry dressing changes.  The wound was wrapped with 4 x 4 gauze Kerlix Kling Ace bandage. All bony prominences were adequately padded  At the conclusion of the procedure the patient was awoken from anesthesia and found to have tolerated the procedure well any complications. There were transferred to PACU with vital signs stable and vascular status intact.  Boneta Lucks, DPM

## 2021-04-13 NOTE — Progress Notes (Addendum)
Met with the patient in the room to discuss DC plan and needs She lives at home with her sister She is independent at home She will need a wheelchair, it will be delivered to her room by [employer] prior to DC She will go to outpatient wound care if needed TOC to continue to follow for needs

## 2021-04-13 NOTE — Discharge Instructions (Signed)
Please contact primary care provider if refills for Dexcom CGM are needed. Would also recommend you call [employer] customer service ( [phone]) to request a replacement sensor for the one that was removed prior to MRI.

## 2021-04-14 DIAGNOSIS — L0390 Cellulitis, unspecified: Secondary | ICD-10-CM | POA: Diagnosis not present

## 2021-04-14 LAB — BASIC METABOLIC PANEL
Anion gap: 8 (ref 5–15)
BUN: 19 mg/dL (ref 8–23)
CO2: 25 mmol/L (ref 22–32)
Calcium: 8.2 mg/dL — ABNORMAL LOW (ref 8.9–10.3)
Chloride: 107 mmol/L (ref 98–111)
Creatinine, Ser: 2.45 mg/dL — ABNORMAL HIGH (ref 0.44–1.00)
GFR, Estimated: 22 mL/min — ABNORMAL LOW (ref >60)
Glucose, Bld: 112 mg/dL — ABNORMAL HIGH (ref 70–99)
Potassium: 4.3 mmol/L (ref 3.5–5.1)
Sodium: 140 mmol/L (ref 135–145)

## 2021-04-14 LAB — CBC
HCT: 35.7 % — ABNORMAL LOW (ref 36.0–46.0)
Hemoglobin: 10.9 g/dL — ABNORMAL LOW (ref 12.0–15.0)
MCH: 26.3 pg (ref 26.0–34.0)
MCHC: 30.5 g/dL (ref 30.0–36.0)
MCV: 86.2 fL (ref 80.0–100.0)
Platelets: 301 10*3/uL (ref 150–400)
RBC: 4.14 MIL/uL (ref 3.87–5.11)
RDW: 13.7 % (ref 11.5–15.5)
WBC: 16 10*3/uL — ABNORMAL HIGH (ref 4.0–10.5)
nRBC: 0 % (ref 0.0–0.2)

## 2021-04-14 LAB — GLUCOSE, CAPILLARY
Glucose-Capillary: 145 mg/dL — ABNORMAL HIGH (ref 70–99)
Glucose-Capillary: 200 mg/dL — ABNORMAL HIGH (ref 70–99)
Glucose-Capillary: 210 mg/dL — ABNORMAL HIGH (ref 70–99)
Glucose-Capillary: 141 mg/dL — ABNORMAL HIGH (ref 70–99)

## 2021-04-14 MED ORDER — HYDRALAZINE HCL 25 MG PO TABS
25.0000 mg | ORAL_TABLET | Freq: Four times a day (QID) | ORAL | Status: DC | PRN
  Administered 2021-04-16 – 2021-04-17 (×3): 25 mg via ORAL
  Filled 2021-04-14 (×3): qty 1

## 2021-04-14 MED ORDER — NIFEDIPINE ER 60 MG PO TB24
60.0000 mg | ORAL_TABLET | Freq: Every day | ORAL | Status: DC
  Administered 2021-04-14 – 2021-04-24 (×11): 60 mg via ORAL
  Filled 2021-04-14 (×11): qty 1

## 2021-04-14 NOTE — Plan of Care (Signed)

## 2021-04-14 NOTE — Progress Notes (Signed)
PROGRESS NOTE    Chelsea Barton  GTX:[id number] DOB: Oct 24, 1960 DOA: 04/10/2021 PCP: Burnard Hawthorne, FNP    Brief Narrative:  Chelsea Barton is a 60 year old female with past medical history significant for type 2 diabetes mellitus, CKD stage IV, essential hypertension, hyperlipidemia, LVH, mitral insufficiency, secondary hyperparathyroidism, hyperlipidemia who presented to [facility] ED on 10/25 with left foot pain and swelling.  Onset roughly 2 days ago.  Denies any trauma.   In the ED, temperature 98.4 F, HR 72, RR 18, BP 140/55, SPO2 97% on room air.  Sodium 138, potassium 4.1, chloride 102, CO2 26, glucose 213, BUN 40, creatinine 2.70.  Lactic acid 1.9.  WBC 26.1, hemoglobin 11.3, platelets 243.  INR 1.3.  COVID-19 PCR negative.  Influenza A/B PCR negative.  X-ray left foot with comminuted and displaced second proximal phalanx fracture, soft tissue emphysema involving the digits and forefoot around the fracture suggestive of necrotizing soft tissue infection.  Podiatry was consulted.  Patient was started empiric antibiotics.  TRH consulted for further evaluation and management of left foot diabetic infection.   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 X-ray left foot with comminuted and displaced second proximal phalanx fracture, soft tissue emphysema involving the digits and forefoot around the fracture suggestive of  necrotizing soft tissue infection.  Patient underwent incision and drainage and left second toe amputation by podiatry, Dr. Amalia Hailey on 04/10/2021.  MRI left foot with hyperintense signal Sica metatarsal head with adjacent skin defect concerning for vasculitis, marked soft tissue edema about amputation site, diabetic myopathy/myositis of the plantar muscles without drainable fluid collection/abscess.  Underwent transmetatarsal amputation by podiatry, Dr. Posey Pronto on 04/12/2021. --Podiatry following, appreciate assistance --WBC 26.1>19.7>19.5>16.1>16.0 --ESR 72 --CRP 27.8 --Blood cultures x2: no growth x 4 days --Operative culture 10/25: few GPCs, Mod GNRs, Mod GPCs --Zyvox 600 mg IV q12h --Zosyn 3.$RemoveBefore'375mg'[id number]$  IV q8h --Oxycodone 5 mg q4h prn moderate pain --Morphine 2 mg IV q2h PRN severe pain --Not weightbearing left lower extremity with cam walker --CBC daily  Peripheral vascular disease Ultrasound arterial ABI 10/26 with moderate to severe left lower extremity peripheral artery disease with ABI 0.53 and mild right lower extremity peripheral artery disease with ABI 0.90.  Underwent angiogram by vascular surgery on 04/13/2021 with arthrectomy left SFA and angioplasty with stent placement left superficial femoral artery. --Plavix 75 mg p.o. daily --Vascular surgery plans treatment of right common iliac lesion and left iliac as a staged procedure to minimize contrast exposure given her renal dysfunction  Type 2 diabetes mellitus with hyperglycemia Hemoglobin A1c 7.8 and 04/10/2021, not optimally controlled.  Home regimen includes Lantus 40 units apparently daily, Farxiga 10 mg p.o. daily. --Semglee 20u [place] QHS --NovoLog SSI for further coverage --CBGs qAC/HS  Acute renal failure on CKD stage IV Creatinine elevated 2.70 on admission, baseline 2.3-2.4.  Etiology likely secondary to acute infectious process as above. --Cr 2.70>2.62>2.52>2.40>2.45  --Holding home enalapril --Avoid nephrotoxins, renal dose  all medications --  BMP daily  Essential hypertension Home regimen includes carvedilol 12.5 mg p.o. twice daily, enalapril 40 mg p.o. daily, digoxin 0.15 mg p.o. daily, nifedipine 30 mg p.o. daily, hydralazine prn. --Carvedilol 12.5 mg p.o. twice daily --Digoxin 0.125 mg p.o. daily --Increase nifedipine to 60 mg p.o. daily --Holding enalapril due to AKI as above.  Hyperlipidemia HDL 25, LDL 42. --continue home Crestor 10 mg p.o. daily  Obesity Body mass index is 29.95 kg/m.  Discussed with patient needs for aggressive lifestyle changes/weight loss as this complicates all facets of care.  Outpatient follow-up with PCP.     DVT prophylaxis: SCDs Start: 04/10/21 2138 Place TED hose Start: 04/10/21 1455   Code Status: Full Code Family Communication: No family present at bedside this morning.  Disposition Plan:  Level of care: Med-Surg Status is: Inpatient  Remains inpatient appropriate because: pending further vascular surgical intervention, IV antibiotics    Consultants:  Podiatry, Dr. Amalia Hailey Vascular surgery, Dr. Delana Meyer  Procedures:  Incision and drainage left foot with left second toe amputation, podiatry Dr. Amalia Hailey 10/25 Left foot transmetatarsal amputation, podiatry Dr. Posey Pronto 10/27.  Antimicrobials:  Zyvox 10/25>> Zosyn 10/25>> Vancomycin 10/25 - 10/25 Clindamycin 10/25 - 10/25    Subjective: Patient seen examined bedside, resting comfortably.  Edge of bed.  No specific complaints this morning.  States pain is well controlled.  Seen by podiatry this morning.  Underwent angiogram yesterday.  No family present at bedside this morning.  Would like to know when she can go home.  Discussed with her that vascular surgery is planning on a staged procedure for further intervention of her peripheral vascular disease given her renal dysfunction and want to avoid excessive contrast exposure.  No specific complaints this morning. Denies headache, no current fever/chills/night  sweats, no nausea/vomiting/diarrhea, no chest pain, palpitations, no abdominal pain, no weakness, no fatigue, no paresthesias.  No acute events overnight per nursing staff.  Objective: Vitals:   04/13/21 1540 04/13/21 2000 04/14/21 0400 04/14/21 0811  BP: (!) 161/63 (!) 167/63 (!) 171/59 133/62  Pulse: 78 77 76 82  Resp: 16 18 18 16   Temp: 99.8 F (37.7 C) 99.3 F (37.4 C) 99.1 F (37.3 C) (!) 97.5 F (36.4 C)  TempSrc:  Oral Oral   SpO2: 95% 96% 95% 96%  Weight:      Height:        Intake/Output Summary (Last 24 hours) at 04/14/2021 1138 Last data filed at 04/13/2021 1734 Gross per 24 hour  Intake 624.36 ml  Output --  Net 624.36 ml   Filed Weights   04/10/21 1242 04/12/21 1537 04/13/21 1040  Weight: 86.2 kg 86.2 kg 81.6 kg    Examination:  General exam: Appears calm and comfortable, obese Respiratory system: Clear to auscultation. Respiratory effort normal.  On room air Cardiovascular system: S1 & S2 heard, RRR. No JVD, murmurs, rubs, gallops or clicks. No pedal edema. Gastrointestinal system: Abdomen is nondistended, soft and nontender. No organomegaly or masses felt. Normal bowel sounds heard. Central nervous system: Alert and oriented. No focal neurological deficits. Extremities: Symmetric 5 x 5 power.  Left foot with dressing/Ace wrap in place, clean/dry/intact Skin: Left foot wound with dressing/ACE Wrap in place, clean/dry/intact; otherwise no rashes, lesions or ulcers Psychiatry: Judgement and insight appear normal. Mood & affect appropriate.     Data Reviewed: I have personally reviewed following labs and imaging studies  CBC: Recent Labs  Lab 04/10/21 1357 04/11/21 0245 04/12/21 0429 04/13/21 0449 04/14/21 0433  WBC 26.1* 19.7*  19.5* 16.1* 16.0*  NEUTROABS 21.5*  --   --   --   --   HGB 11.3* 11.2* 11.2* 11.3* 10.9*  HCT 35.6* 35.1* 35.1* 34.9* 35.7*  MCV 87.9 86.5 84.8 86.0 86.2  PLT 243 247 259 274 654   Basic Metabolic Panel: Recent Labs   Lab 04/10/21 1357 04/11/21 0245 04/12/21 0429 04/13/21 0449 04/14/21 0433  NA 138 136 136 138 140  K 4.1 3.8 3.9 4.0 4.3  CL 102 106 102 106 107  CO2 26 26 25 25 25   GLUCOSE 213* 255* 348* 127* 112*  BUN 40* 36* 29* 22 19  CREATININE 2.70* 2.62* 2.52* 2.40* 2.45*  CALCIUM 9.0 8.4* 8.1* 8.0* 8.2*   GFR: Estimated Creatinine Clearance: 25.4 mL/min (A) (by C-G formula based on SCr of 2.45 mg/dL (H)). Liver Function Tests: Recent Labs  Lab 04/10/21 1357 04/11/21 0245  AST 22 20  ALT 20 17  ALKPHOS 108 103  BILITOT 1.1 0.8  PROT 7.5 6.8  ALBUMIN 2.8* 2.6*   No results for input(s): LIPASE, AMYLASE in the last 168 hours. No results for input(s): AMMONIA in the last 168 hours. Coagulation Profile: Recent Labs  Lab 04/10/21 1357  INR 1.3*   Cardiac Enzymes: No results for input(s): CKTOTAL, CKMB, CKMBINDEX, TROPONINI in the last 168 hours. BNP (last 3 results) No results for input(s): PROBNP in the last 8760 hours. HbA1C: No results for input(s): HGBA1C in the last 72 hours.  CBG: Recent Labs  Lab 04/13/21 1041 04/13/21 1400 04/13/21 1537 04/13/21 2159 04/14/21 0809  GLUCAP 128* 92 123* 142* 145*   Lipid Profile: Recent Labs    04/12/21 0429  CHOL 84  HDL 25*  LDLCALC 42  TRIG 87  CHOLHDL 3.4   Thyroid Function Tests: No results for input(s): TSH, T4TOTAL, FREET4, T3FREE, THYROIDAB in the last 72 hours. Anemia Panel: No results for input(s): VITAMINB12, FOLATE, FERRITIN, TIBC, IRON, RETICCTPCT in the last 72 hours. Sepsis Labs: Recent Labs  Lab 04/10/21 1357 04/10/21 1637  LATICACIDVEN 1.9 1.5    Recent Results (from the past 240 hour(s))  Resp Panel by RT-PCR (Flu A&B, Covid) Nasopharyngeal Swab     Status: None   Collection Time: 04/10/21  2:28 PM   Specimen: Nasopharyngeal Swab; Nasopharyngeal(NP) swabs in vial transport medium  Result Value Ref Range Status   SARS Coronavirus 2 by RT PCR NEGATIVE NEGATIVE Final    Comment:  (NOTE) SARS-CoV-2 target nucleic acids are NOT DETECTED.  The SARS-CoV-2 RNA is generally detectable in upper respiratory specimens during the acute phase of infection. The lowest concentration of SARS-CoV-2 viral copies this assay can detect is 138 copies/mL. A negative result does not preclude SARS-Cov-2 infection and should not be used as the sole basis for treatment or other patient management decisions. A negative result may occur with  improper specimen collection/handling, submission of specimen other than nasopharyngeal swab, presence of viral mutation(s) within the areas targeted by this assay, and inadequate number of viral copies(<138 copies/mL). A negative result must be combined with clinical observations, patient history, and epidemiological information. The expected result is Negative.  Fact Sheet for Patients:  EntrepreneurPulse.com.au  Fact Sheet for Healthcare Providers:  IncredibleEmployment.be  This test is no t yet approved or cleared by the [place] FDA and  has been authorized for detection and/or diagnosis of SARS-CoV-2 by FDA under an Emergency Use Authorization (EUA). This EUA will remain  in effect (meaning this test can be used) for the duration  of the COVID-19 declaration under Section 564(b)(1) of the Act, 21 U.S.C.section 360bbb-3(b)(1), unless the authorization is terminated  or revoked sooner.       Influenza A by PCR NEGATIVE NEGATIVE Final   Influenza B by PCR NEGATIVE NEGATIVE Final    Comment: (NOTE) The Xpert Xpress SARS-CoV-2/FLU/RSV plus assay is intended as an aid in the diagnosis of influenza from Nasopharyngeal swab specimens and should not be used as a sole basis for treatment. Nasal washings and aspirates are unacceptable for Xpert Xpress SARS-CoV-2/FLU/RSV testing.  Fact Sheet for Patients: EntrepreneurPulse.com.au  Fact Sheet for Healthcare  Providers: IncredibleEmployment.be  This test is not yet approved or cleared by the [place] FDA and has been authorized for detection and/or diagnosis of SARS-CoV-2 by FDA under an Emergency Use Authorization (EUA). This EUA will remain in effect (meaning this test can be used) for the duration of the COVID-19 declaration under Section 564(b)(1) of the Act, 21 U.S.C. section 360bbb-3(b)(1), unless the authorization is terminated or revoked.  Performed at [facility], [address] [place], [place] [address]   Blood culture (routine single)     Status: None (Preliminary result)   Collection Time: 04/10/21  2:37 PM   Specimen: BLOOD  Result Value Ref Range Status   Specimen Description BLOOD BLOOD LEFT FOREARM  Final   Special Requests   Final    BOTTLES DRAWN AEROBIC AND ANAEROBIC Blood Culture adequate volume   Culture   Final    NO GROWTH 4 DAYS Performed at [facility], [address], [place], [place] [address]    Report Status PENDING  Incomplete  Culture, blood (single)     Status: None (Preliminary result)   Collection Time: 04/10/21  4:32 PM   Specimen: BLOOD  Result Value Ref Range Status   Specimen Description BLOOD BLOOD RIGHT HAND  Final   Special Requests   Final    BOTTLES DRAWN AEROBIC AND ANAEROBIC Blood Culture adequate volume   Culture   Final    NO GROWTH 4 DAYS Performed at [facility], [address] [place], [place] [address]    Report Status PENDING  Incomplete  Aerobic/Anaerobic Culture w Gram Stain (surgical/deep wound)     Status: None (Preliminary result)   Collection Time: 04/10/21  8:34 PM   Specimen: PATH Other; Wound  Result Value Ref Range Status   Specimen Description   Final    FOOT LEFT Performed at [facility], [address] [place], [place] [address]    Special Requests   Final    NONE Performed at [facility], [place], [place]  [address]    Gram Stain   Final    FEW SQUAMOUS EPITHELIAL CELLS PRESENT FEW WBC SEEN FEW GRAM POSITIVE RODS MODERATE GRAM NEGATIVE RODS MODERATE GRAM POSITIVE COCCI    Culture   Final    NORMAL SKIN FLORA HOLDING FOR POSSIBLE ANAEROBE Performed at [facility], [place] [address], [place], [place] [address]    Report Status PENDING  Incomplete         Radiology Studies: PERIPHERAL VASCULAR CATHETERIZATION  Result Date: 04/13/2021 See surgical note for result.  DG Foot Complete Left  Result Date: 04/12/2021 CLINICAL DATA:  Postop EXAM: LEFT FOOT - COMPLETE 3+ VIEW COMPARISON:  04/10/2021 FINDINGS: Interval transmetatarsal amputation of the first through fifth digits. Gas in the soft tissues consistent with recent surgery. No fracture identified. IMPRESSION: Interval transmetatarsal amputation of the first  through fifth digits with expected postsurgical change Electronically Signed   By: Donavan Foil M.D.   On: 04/12/2021 19:17        Scheduled Meds:  carvedilol  12.5 mg Oral BID WC   clopidogrel  75 mg Oral Q breakfast   dapagliflozin propanediol  10 mg Oral Daily   digoxin  125 mcg Oral Daily   insulin aspart  0-15 Units Subcutaneous TID WC   insulin aspart  0-5 Units Subcutaneous QHS   insulin glargine-yfgn  20 Units Subcutaneous QHS   NIFEdipine  60 mg Oral Daily   rosuvastatin  10 mg Oral QHS   sodium chloride flush  3 mL Intravenous Q12H   Continuous Infusions:  sodium chloride 250 mL (04/13/21 2256)   lactated ringers Stopped (04/13/21 1029)   linezolid (ZYVOX) IV 600 mg (04/14/21 0431)   piperacillin-tazobactam (ZOSYN)  IV 3.375 g (04/14/21 0613)     LOS: 3 days    Time spent: 37 minutes spent on chart review, discussion with nursing staff, consultants, updating family and interview/physical exam; more than 50% of that time was spent in counseling and/or coordination of care.    Khady Vandenberg J [place] DO [facility] Available via Epic secure chat  7am-7pm After these hours, please refer to coverage provider listed on [email or web address] 04/14/2021, 11:38 AM

## 2021-04-14 NOTE — Progress Notes (Signed)
Physical Therapy Treatment Patient Details Name: Chelsea Barton MRN: [id number] DOB: 11-Dec-1960 Today's Date: 04/14/2021   History of Present Illness Pt is a 60 y.o. 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 PMH includes HTN, CAD, DM, CKD IV, mitral valve insufficiency.    PT Comments    Pt seated in recliner beginning of session, CAM boot not in room or donned. RN notified. Pt continues to demonstrate compliance with NWB status with mobility. Progressed to [facility] training this session and pt is requires supervision for removing/assembling WC hardware in preparation for transfers. Pt was able to perform bed <> WC transfer with task set-up. PT to continue reviewing transfer and set-up to ensure pt is independent with transfers at home. Pt continues to hop w/ RW, supervision for short distances with good concentric, eccentric control. Discharge recommendations remain HHPT. Skilled PT intervention is indicated to address deficits in function, mobility, and to return to PLOF as able.     Recommendations for follow up therapy are one component of a multi-disciplinary discharge planning process, led by the attending physician.  Recommendations may be updated based on patient status, additional functional criteria and insurance authorization.  Follow Up Recommendations  Home health PT     Assistance Recommended at Discharge Intermittent Supervision/Assistance  Equipment Recommendations  Wheelchair (measurements PT)    Recommendations for Other Services       Precautions / Restrictions Precautions Precautions: Fall Required Braces or Orthoses: Other Brace Other Brace: Cam boot Restrictions Weight Bearing Restrictions: Yes LLE Weight Bearing: Non weight bearing     Mobility  Bed Mobility Overal bed mobility: Modified Independent                  Transfers Overall transfer level:  Needs assistance Equipment used: Rolling walker (2 wheels) Transfers: Sit to/from Stand;Lateral/Scoot Transfers Sit to Stand: Supervision          Lateral/Scoot Transfers: Supervision General transfer comment: Cues for hand placement on RW, lateral scoot bed > WC transfer w/ assistance and instruction on removing WC hardware for trasnfer set-up    Ambulation/Gait Ambulation/Gait assistance: Supervision Gait Distance (Feet): 3 Feet Assistive device: Rolling walker (2 wheels)       General Gait Details: Hops with good control short distances with SUPV for safety   Stairs             Wheelchair Mobility    Modified Rankin (Stroke Patients Only)       Balance Overall balance assessment: Needs assistance Sitting-balance support: No upper extremity supported Sitting balance-Leahy Scale: Good       Standing balance-Leahy Scale: Poor Standing balance comment: Requires BUE support 2/2 NWB status                            Cognition Arousal/Alertness: Awake/alert Behavior During Therapy: WFL for tasks assessed/performed Overall Cognitive Status: Within Functional Limits for tasks assessed                                          Exercises Other Exercises Other Exercises: Pt edu: WC assembly, mobility, and transfers with WC Other Exercises: Pt able to complete 180 ft of WC training with cues for pivoting, hand placement, and correct strokes when propelling    General  Comments        Pertinent Vitals/Pain Pain Assessment: No/denies pain    Home Living                          Prior Function            PT Goals (current goals can now be found in the care plan section) Acute Rehab PT Goals Patient Stated Goal: To go home Progress towards PT goals: Progressing toward goals    Frequency    7X/week      PT Plan Current plan remains appropriate    Co-evaluation              AM-PAC PT "6 Clicks"  Mobility   Outcome Measure  Help needed turning from your back to your side while in a flat bed without using bedrails?: None Help needed moving from lying on your back to sitting on the side of a flat bed without using bedrails?: None Help needed moving to and from a bed to a chair (including a wheelchair)?: A Little Help needed standing up from a chair using your arms (e.g., wheelchair or bedside chair)?: A Little Help needed to walk in hospital room?: A Lot Help needed climbing 3-5 steps with a railing? : A Lot 6 Click Score: 18    End of Session Equipment Utilized During Treatment: Gait belt Activity Tolerance: Patient tolerated treatment well Patient left: in bed;with call bell/phone within reach;with bed alarm set Nurse Communication: Mobility status PT Visit Diagnosis: Other abnormalities of gait and mobility (R26.89);Muscle weakness (generalized) (M62.81)     Time: 2536-6440 PT Time Calculation (min) (ACUTE ONLY): 38 min  Charges:                       [employer], SPT

## 2021-04-14 NOTE — Progress Notes (Signed)
Subjective: POD #2 s/p left foot TMA. Underwent angioplasty and stent of the left SFA 04/13/2021 with Dr. Delana Meyer. She states she is doing well. She has not had any pain. No fevers, chills.   Objective: AAO x3, NAD Status post left transmetatarsal amputation.  Sutures intact and there is no evidence of dehiscence on the surgical site.  Small amount of bloody drainage expressed there is no purulence.  There is mild edema to the foot.  Mild erythema dorsally.  There is no areas of fluctuation or crepitation.  On the plantar aspect centrally is some darkened discoloration.  We need to keep a close eye on this to make sure tissue survives. No ulceration of the right foot. No pain with calf compression of erythema or warmth.       Assessment: POD #2 left TMA  Plan: Dressing was changed today.  Xeroform was applied followed by 4 x 4's, Kerlix, ABD, Ace.  Encouraged elevation.  For now would recommend continued IV antibiotics.  Wound culture from surgery showing normal skin flora but still pulmonary.  I did have gram-positive rods, moderate gram-negative rods as well as moderate gram-positive cocci.  She is nonweightbearing.  I will see her tomorrow change the bandage as well.  Celesta Gentile, DPM

## 2021-04-15 DIAGNOSIS — L0390 Cellulitis, unspecified: Secondary | ICD-10-CM | POA: Diagnosis not present

## 2021-04-15 LAB — CULTURE, BLOOD (SINGLE)
Culture: NO GROWTH
Culture: NO GROWTH
Special Requests: ADEQUATE
Special Requests: ADEQUATE

## 2021-04-15 LAB — CBC
HCT: 33.3 % — ABNORMAL LOW (ref 36.0–46.0)
Hemoglobin: 10.6 g/dL — ABNORMAL LOW (ref 12.0–15.0)
MCH: 27.5 pg (ref 26.0–34.0)
MCHC: 31.8 g/dL (ref 30.0–36.0)
MCV: 86.3 fL (ref 80.0–100.0)
Platelets: 284 10*3/uL (ref 150–400)
RBC: 3.86 MIL/uL — ABNORMAL LOW (ref 3.87–5.11)
RDW: 13.8 % (ref 11.5–15.5)
WBC: 14.3 10*3/uL — ABNORMAL HIGH (ref 4.0–10.5)
nRBC: 0 % (ref 0.0–0.2)

## 2021-04-15 LAB — BASIC METABOLIC PANEL
Anion gap: 10 (ref 5–15)
BUN: 20 mg/dL (ref 8–23)
CO2: 24 mmol/L (ref 22–32)
Calcium: 8.5 mg/dL — ABNORMAL LOW (ref 8.9–10.3)
Chloride: 105 mmol/L (ref 98–111)
Creatinine, Ser: 2.48 mg/dL — ABNORMAL HIGH (ref 0.44–1.00)
GFR, Estimated: 22 mL/min — ABNORMAL LOW (ref >60)
Glucose, Bld: 117 mg/dL — ABNORMAL HIGH (ref 70–99)
Potassium: 4.3 mmol/L (ref 3.5–5.1)
Sodium: 139 mmol/L (ref 135–145)

## 2021-04-15 LAB — GLUCOSE, CAPILLARY
Glucose-Capillary: 126 mg/dL — ABNORMAL HIGH (ref 70–99)
Glucose-Capillary: 132 mg/dL — ABNORMAL HIGH (ref 70–99)
Glucose-Capillary: 163 mg/dL — ABNORMAL HIGH (ref 70–99)
Glucose-Capillary: 176 mg/dL — ABNORMAL HIGH (ref 70–99)
Glucose-Capillary: 200 mg/dL — ABNORMAL HIGH (ref 70–99)

## 2021-04-15 MED ORDER — SODIUM CHLORIDE 0.9 % IV SOLN: INTRAVENOUS | Status: DC

## 2021-04-15 NOTE — Progress Notes (Signed)
Subjective: POD #3 s/p left foot TMA. Underwent angioplasty and stent of the left SFA 04/13/2021 with Dr. Delana Meyer. Vascular surgery plans treatment of right common iliac lesion and left iliac as a staged procedure to minimize contrast exposure given her renal dysfunction. She states she is doing well. She has not had any pain. No fevers, chills.   Objective: AAO x3, NAD Status post left transmetatarsal amputation.  Sutures intact and there is no evidence of dehiscence on the surgical site.  Small bloody, serous drainage expressed there is no purulence.  On the plantar aspect of the foot need to monitor the viability of this.  Some dusky changes present.  There is decreased edema and erythema.  No fluctuation crepitation. No pain with calf compression of erythema or warmth.            Assessment: POD #3 left TMA  Plan: Dressing was changed today.  Xeroform was applied followed by 4 x 4's, Kerlix, ABD, Ace.  Encouraged elevation.  Continue IV antibiotics.  Wound culture showing moderate Prevotella disiens, beta lactamase positive. She is nonweightbearing.  Podiatry will continue to follow.  Celesta Gentile, DPM

## 2021-04-15 NOTE — Progress Notes (Signed)
PROGRESS NOTE    Chelsea Barton  CXK:[id number] DOB: 17-Apr-1961 DOA: 04/10/2021 PCP: Burnard Hawthorne, FNP    Brief Narrative:  Chelsea Barton is a 60 year old female with past medical history significant for type 2 diabetes mellitus, CKD stage IV, essential hypertension, hyperlipidemia, LVH, mitral insufficiency, secondary hyperparathyroidism, hyperlipidemia who presented to [facility] ED on 10/25 with left foot pain and swelling.  Onset roughly 2 days ago.  Denies any trauma.   In the ED, temperature 98.4 F, HR 72, RR 18, BP 140/55, SPO2 97% on room air.  Sodium 138, potassium 4.1, chloride 102, CO2 26, glucose 213, BUN 40, creatinine 2.70.  Lactic acid 1.9.  WBC 26.1, hemoglobin 11.3, platelets 243.  INR 1.3.  COVID-19 PCR negative.  Influenza A/B PCR negative.  X-ray left foot with comminuted and displaced second proximal phalanx fracture, soft tissue emphysema involving the digits and forefoot around the fracture suggestive of necrotizing soft tissue infection.  Podiatry was consulted.  Patient was started empiric antibiotics.  TRH consulted for further evaluation and management of left foot diabetic infection.   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 X-ray left foot with comminuted and displaced second proximal phalanx fracture, soft tissue emphysema involving the digits and forefoot around the fracture suggestive of  necrotizing soft tissue infection.  Patient underwent incision and drainage and left second toe amputation by podiatry, Dr. Amalia Hailey on 04/10/2021.  MRI left foot with hyperintense signal Sica metatarsal head with adjacent skin defect concerning for vasculitis, marked soft tissue edema about amputation site, diabetic myopathy/myositis of the plantar muscles without drainable fluid collection/abscess.  Underwent transmetatarsal amputation by podiatry, Dr. Posey Pronto on 04/12/2021. --Podiatry following, appreciate assistance --WBC 26.1>19.7>19.5>16.1>16.0 --ESR 72 --CRP 27.8 --Blood cultures x2: no growth x 5 days --Operative culture 10/25: few GPCs, Mod GNRs, Mod GPCs; finalized report pending --Zyvox 600 mg IV q12h --Zosyn 3.$RemoveBefore'375mg'GNcDYXrkNYYiL$  IV q8h --Oxycodone 5 mg q4h prn moderate pain --Morphine 2 mg IV q2h PRN severe pain --Not weightbearing left lower extremity with cam walker --CBC daily  Peripheral vascular disease Ultrasound arterial ABI 10/26 with moderate to severe left lower extremity peripheral artery disease with ABI 0.53 and mild right lower extremity peripheral artery disease with ABI 0.90.  Underwent angiogram by vascular surgery on 04/13/2021 with arthrectomy left SFA and angioplasty with stent placement left superficial femoral artery. --Plavix 75 mg p.o. daily --Vascular surgery plans treatment of right common iliac lesion and left iliac as a staged procedure to minimize contrast exposure given her renal dysfunction  Type 2 diabetes mellitus with hyperglycemia Hemoglobin A1c 7.8 and 04/10/2021, not optimally controlled.  Home regimen includes Lantus 40 units apparently daily, Farxiga 10 mg p.o. daily. --Semglee 20u [place] QHS --NovoLog SSI for further coverage --CBGs qAC/HS  Acute renal failure on CKD stage IV Creatinine elevated 2.70 on admission, baseline 2.3-2.4.  Etiology likely secondary to acute infectious process as above. --Cr 2.70>2.62>2.52>2.40>2.45>2.48  --Holding home  enalapril --NS at 75  mL/hr x 24h --Avoid nephrotoxins, renal dose all medications --BMP daily  Essential hypertension Home regimen includes carvedilol 12.5 mg p.o. twice daily, enalapril 40 mg p.o. daily, digoxin 0.15 mg p.o. daily, nifedipine 30 mg p.o. daily, hydralazine prn. --Carvedilol 12.5 mg p.o. twice daily --Digoxin 0.125 mg p.o. daily --Increased nifedipine to 60 mg p.o. daily --Holding enalapril due to AKI as above.  Hyperlipidemia HDL 25, LDL 42. --continue home Crestor 10 mg p.o. daily  Obesity Body mass index is 29.95 kg/m.  Discussed with patient needs for aggressive lifestyle changes/weight loss as this complicates all facets of care.  Outpatient follow-up with PCP.     DVT prophylaxis: SCDs Start: 04/10/21 2138 Place TED hose Start: 04/10/21 1455   Code Status: Full Code Family Communication: No family present at bedside this morning.  Disposition Plan:  Level of care: Med-Surg Status is: Inpatient  Remains inpatient appropriate because: pending further vascular surgical intervention, IV antibiotics    Consultants:  Podiatry, Dr. Amalia Hailey Vascular surgery, Dr. Delana Meyer  Procedures:  Incision and drainage left foot with left second toe amputation, podiatry Dr. Amalia Hailey 10/25 Left foot transmetatarsal amputation, podiatry Dr. Posey Pronto 10/27.  Antimicrobials:  Zyvox 10/25>> Zosyn 10/25>> Vancomycin 10/25 - 10/25 Clindamycin 10/25 - 10/25    Subjective: Patient seen examined bedside, resting comfortably.  Eating breakfast.  No specific complaints this morning.  Seen by podiatry this morning with recommendations of continued IV antibiotics awaiting finalized operative culture.  Vascular surgery planning on staged intervention, possibly later this week. No specific complaints this morning. Denies headache, no current fever/chills/night sweats, no nausea/vomiting/diarrhea, no chest pain, palpitations, no abdominal pain, no weakness, no fatigue, no paresthesias.  No  acute events overnight per nursing staff.  Objective: Vitals:   04/14/21 1736 04/14/21 2021 04/15/21 0316 04/15/21 0736  BP: (!) 156/64 102/79 (!) 141/66 (!) 157/63  Pulse: 77 72 75 77  Resp: 18 18 16 18   Temp: 99.1 F (37.3 C) 99.2 F (37.3 C) 98.1 F (36.7 C) 98.5 F (36.9 C)  TempSrc: Oral  Oral   SpO2: 97% 96% 96% 93%  Weight:      Height:        Intake/Output Summary (Last 24 hours) at 04/15/2021 1149 Last data filed at 04/15/2021 0500 Gross per 24 hour  Intake 849.63 ml  Output 850 ml  Net -0.37 ml   Filed Weights   04/10/21 1242 04/12/21 1537 04/13/21 1040  Weight: 86.2 kg 86.2 kg 81.6 kg    Examination:  General exam: Appears calm and comfortable, obese Respiratory system: Clear to auscultation. Respiratory effort normal.  On room air Cardiovascular system: S1 & S2 heard, RRR. No JVD, murmurs, rubs, gallops or clicks. No pedal edema. Gastrointestinal system: Abdomen is nondistended, soft and nontender. No organomegaly or masses felt. Normal bowel sounds heard. Central nervous system: Alert and oriented. No focal neurological deficits. Extremities: Symmetric 5 x 5 power.  Left foot with dressing/Ace wrap in place, clean/dry/intact Skin: Left foot wound with dressing/ACE Wrap in place, clean/dry/intact; otherwise no rashes, lesions or ulcers Psychiatry: Judgement and insight appear normal. Mood & affect appropriate.     Data Reviewed: I have personally reviewed following labs and imaging studies  CBC: Recent Labs  Lab 04/10/21 1357 04/11/21 0245 04/12/21 0429 04/13/21 0449 04/14/21 0433 04/15/21 0427  WBC 26.1* 19.7* 19.5* 16.1* 16.0* 14.3*  NEUTROABS 21.5*  --   --   --   --   --   HGB 11.3* 11.2* 11.2* 11.3* 10.9* 10.6*  HCT 35.6* 35.1* 35.1* 34.9* 35.7* 33.3*  MCV 87.9 86.5 84.8 86.0 86.2 86.3  PLT 243 247 259 274 301 182   Basic Metabolic Panel: Recent Labs  Lab 04/11/21 0245 04/12/21 0429 04/13/21 0449 04/14/21 0433 04/15/21 0427  NA  136 136 138 140 139  K 3.8 3.9 4.0 4.3 4.3  CL 106 102 106 107 105  CO2 26 25 25 25 24   GLUCOSE 255* 348* 127* 112* 117*  BUN 36* 29* 22 19 20   CREATININE 2.62* 2.52* 2.40* 2.45* 2.48*  CALCIUM 8.4* 8.1* 8.0* 8.2* 8.5*   GFR: Estimated Creatinine Clearance: 25.1 mL/min (A) (by C-G formula based on SCr of 2.48 mg/dL (H)). Liver Function Tests: Recent Labs  Lab 04/10/21 1357 04/11/21 0245  AST 22 20  ALT 20 17  ALKPHOS 108 103  BILITOT 1.1 0.8  PROT 7.5 6.8  ALBUMIN 2.8* 2.6*   No results for input(s): LIPASE, AMYLASE in the last 168 hours. No results for input(s): AMMONIA in the last 168 hours. Coagulation Profile: Recent Labs  Lab 04/10/21 1357  INR 1.3*   Cardiac Enzymes: No results for input(s): CKTOTAL, CKMB, CKMBINDEX, TROPONINI in the last 168 hours. BNP (last 3 results) No results for input(s): PROBNP in the last 8760 hours. HbA1C: No results for input(s): HGBA1C in the last 72 hours.  CBG: Recent Labs  Lab 04/14/21 1202 04/14/21 1744 04/14/21 2023 04/15/21 0011 04/15/21 0742  GLUCAP 200* 210* 141* 132* 126*   Lipid Profile: No results for input(s): CHOL, HDL, LDLCALC, TRIG, CHOLHDL, LDLDIRECT in the last 72 hours.  Thyroid Function Tests: No results for input(s): TSH, T4TOTAL, FREET4, T3FREE, THYROIDAB in the last 72 hours. Anemia Panel: No results for input(s): VITAMINB12, FOLATE, FERRITIN, TIBC, IRON, RETICCTPCT in the last 72 hours. Sepsis Labs: Recent Labs  Lab 04/10/21 1357 04/10/21 1637  LATICACIDVEN 1.9 1.5    Recent Results (from the past 240 hour(s))  Resp Panel by RT-PCR (Flu A&B, Covid) Nasopharyngeal Swab     Status: None   Collection Time: 04/10/21  2:28 PM   Specimen: Nasopharyngeal Swab; Nasopharyngeal(NP) swabs in vial transport medium  Result Value Ref Range Status   SARS Coronavirus 2 by RT PCR NEGATIVE NEGATIVE Final    Comment: (NOTE) SARS-CoV-2 target nucleic acids are NOT DETECTED.  The SARS-CoV-2 RNA is generally  detectable in upper respiratory specimens during the acute phase of infection. The lowest concentration of SARS-CoV-2 viral copies this assay can detect is 138 copies/mL. A negative result does not preclude SARS-Cov-2 infection and should not be used as the sole basis for treatment or other patient management decisions. A negative result may occur with  improper specimen collection/handling, submission of specimen other than nasopharyngeal swab, presence of viral mutation(s) within the areas targeted by this assay, and inadequate number of viral copies(<138 copies/mL). A negative result must be combined with clinical observations, patient history, and epidemiological information. The expected result is Negative.  Fact Sheet for Patients:  EntrepreneurPulse.com.au  Fact Sheet for Healthcare Providers:  IncredibleEmployment.be  This test is no t yet approved or cleared by the [place] FDA and  has been authorized for detection and/or diagnosis of SARS-CoV-2 by FDA under an Emergency Use Authorization (EUA). This EUA will remain  in effect (meaning this test can be used) for the duration of the COVID-19 declaration under Section 564(b)(1) of the Act, 21 U.S.C.section 360bbb-3(b)(1), unless the authorization is terminated  or revoked sooner.       Influenza A by  PCR NEGATIVE NEGATIVE Final   Influenza B by PCR NEGATIVE NEGATIVE Final    Comment: (NOTE) The Xpert Xpress SARS-CoV-2/FLU/RSV plus assay is intended as an aid in the diagnosis of influenza from Nasopharyngeal swab specimens and should not be used as a sole basis for treatment. Nasal washings and aspirates are unacceptable for Xpert Xpress SARS-CoV-2/FLU/RSV testing.  Fact Sheet for Patients: EntrepreneurPulse.com.au  Fact Sheet for Healthcare Providers: IncredibleEmployment.be  This test is not yet approved or cleared by the [place] FDA  and has been authorized for detection and/or diagnosis of SARS-CoV-2 by FDA under an Emergency Use Authorization (EUA). This EUA will remain in effect (meaning this test can be used) for the duration of the COVID-19 declaration under Section 564(b)(1) of the Act, 21 U.S.C. section 360bbb-3(b)(1), unless the authorization is terminated or revoked.  Performed at [facility], [address] [place], [place] [address]   Blood culture (routine single)     Status: None   Collection Time: 04/10/21  2:37 PM   Specimen: BLOOD  Result Value Ref Range Status   Specimen Description BLOOD BLOOD LEFT FOREARM  Final   Special Requests   Final    BOTTLES DRAWN AEROBIC AND ANAEROBIC Blood Culture adequate volume   Culture   Final    NO GROWTH 5 DAYS Performed at [facility], [address] [place], [place] [address]    Report Status 04/15/2021 FINAL  Final  Culture, blood (single)     Status: None   Collection Time: 04/10/21  4:32 PM   Specimen: BLOOD  Result Value Ref Range Status   Specimen Description BLOOD BLOOD RIGHT HAND  Final   Special Requests   Final    BOTTLES DRAWN AEROBIC AND ANAEROBIC Blood Culture adequate volume   Culture   Final    NO GROWTH 5 DAYS Performed at [facility], [address] [place], [place] [address]    Report Status 04/15/2021 FINAL  Final  Aerobic/Anaerobic Culture w Gram Stain (surgical/deep wound)     Status: None (Preliminary result)   Collection Time: 04/10/21  8:34 PM   Specimen: PATH Other; Wound  Result Value Ref Range Status   Specimen Description   Final    FOOT LEFT Performed at [facility], [address] [place], [place] [address]    Special Requests   Final    NONE Performed at [facility], [place], [place] [address]    Gram Stain   Final    FEW SQUAMOUS EPITHELIAL CELLS PRESENT FEW WBC SEEN FEW GRAM POSITIVE RODS MODERATE GRAM NEGATIVE RODS MODERATE GRAM  POSITIVE COCCI    Culture   Final    HOLDING FOR POSSIBLE ANAEROBE NORMAL SKIN FLORA Performed at [facility], [place] [address] [place], [place] [address]    Report Status PENDING  Incomplete         Radiology Studies: PERIPHERAL VASCULAR CATHETERIZATION  Result Date: 04/13/2021 See surgical note for result.       Scheduled Meds:  carvedilol  12.5 mg Oral BID WC   clopidogrel  75 mg Oral Q breakfast   dapagliflozin propanediol  10 mg Oral Daily   digoxin  125 mcg Oral Daily   insulin aspart  0-15 Units Subcutaneous TID WC   insulin aspart  0-5 Units Subcutaneous QHS   insulin glargine-yfgn  20 Units Subcutaneous QHS   NIFEdipine  60 mg Oral Daily   rosuvastatin  10 mg Oral  QHS   sodium chloride flush  3 mL Intravenous Q12H   Continuous Infusions:  sodium chloride 250 mL (04/13/21 2256)   sodium chloride 75 mL/hr at 04/15/21 0748   linezolid (ZYVOX) IV 600 mg (04/15/21 0516)   piperacillin-tazobactam (ZOSYN)  IV 3.375 g (04/15/21 0641)     LOS: 4 days    Time spent: 37 minutes spent on chart review, discussion with nursing staff, consultants, updating family and interview/physical exam; more than 50% of that time was spent in counseling and/or coordination of care.    Ranetta Armacost J [place] DO [facility] Available via Epic secure chat 7am-7pm After these hours, please refer to coverage provider listed on [email or web address] 04/15/2021, 11:49 AM

## 2021-04-15 NOTE — Evaluation (Signed)
Occupational Therapy Evaluation Patient Details Name: SHIANE WENBERG MRN: [id number] DOB: Jun 23, 1960 Today's Date: 04/15/2021   History of Present Illness Pt is a 60 y.o. 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 PMH includes HTN, CAD, DM, CKD IV, mitral valve insufficiency.   Clinical Impression   Pt seen for OT evaluation this date. Prior to admission, pt was independent in all ADLs/IADLs and functional mobility, living in a 1-story home with sister and nephew. Pt currently requires MIN A for sit>stand LB ADLs, MIN A for standing grooming tasks (using single UE), and MIN GUARD for transfers to/from [facility] due to decreased balance and knowledge of DME use. Pt would benefit from additional skilled OT services to maximize return to PLOF and minimize risk of future falls, injury, caregiver burden, and readmission. Upon discharge, recommend [place] services.         Recommendations for follow up therapy are one component of a multi-disciplinary discharge planning process, led by the attending physician.  Recommendations may be updated based on patient status, additional functional criteria and insurance authorization.   Follow Up Recommendations  Home health OT    Assistance Recommended at Discharge Intermittent Supervision/Assistance  Functional Status Assessment  Patient has had a recent decline in their functional status and demonstrates the ability to make significant improvements in function in a reasonable and predictable amount of time.  Equipment Recommendations  BSC       Precautions / Restrictions Precautions Precautions: Fall Required Braces or Orthoses: Other Brace Other Brace: Cam boot Restrictions Weight Bearing Restrictions: Yes LLE Weight Bearing: Non weight bearing      Mobility Bed Mobility               General bed mobility comments: not assessed, pt in recliner  at beginning/end of session    Transfers Overall transfer level: Needs assistance Equipment used: Rolling walker (2 wheels) Transfers: Sit to/from Stand Sit to Stand: Supervision;Min guard           General transfer comment: Requires cues for safe hand placement with RW use. SUPERVISION from recliner and MIN GUARD from [place] Overall balance assessment: Needs assistance Sitting-balance support: No upper extremity supported;Feet supported Sitting balance-Leahy Scale: Good Sitting balance - Comments: Good sitting balance reaching outside BOS to don/doff sock   Standing balance support: Single extremity supported;During functional activity Standing balance-Leahy Scale: Poor Standing balance comment: Requires MIN A to perform standing grooming task with single UE supported by RW                           ADL either performed or assessed with clinical judgement   ADL Overall ADL's : Needs assistance/impaired     Grooming: Wash/dry face;Minimal assistance;Standing;Applying deodorant;Supervision/safety;Set up;Sitting   Upper Body Bathing: Minimal assistance;Standing Upper Body Bathing Details (indicate cue type and reason): MIN A for standing balance Lower Body Bathing: Minimal assistance;Sit to/from stand Lower Body Bathing Details (indicate cue type and reason): MIN A for standing balance during peri-care     Lower Body Dressing: Supervision/safety;Sitting/lateral leans Lower Body Dressing Details (indicate cue type and reason): to don/doff R sock Toilet Transfer: Min guard;BSC;Rolling walker (2 wheels) Toilet Transfer Details (indicate cue type and reason): Requires verbal cues for safe hand placement with RW use  Pertinent Vitals/Pain Pain Assessment: No/denies pain     Hand Dominance Right   Extremity/Trunk Assessment Upper Extremity Assessment Upper Extremity Assessment: Overall WFL for tasks assessed   Lower Extremity  Assessment Lower Extremity Assessment: LLE deficits/detail LLE Deficits / Details: s/p transmetatarsal amputation L foot       Communication Communication Communication: No difficulties   Cognition Arousal/Alertness: Awake/alert Behavior During Therapy: WFL for tasks assessed/performed Overall Cognitive Status: Within Functional Limits for tasks assessed                                 General Comments: AOx4, pleasant and cooperative                Home Living Family/patient expects to be discharged to:: Private residence Living Arrangements: Other relatives (sister & nephew (who has a disability)) Available Help at Discharge: Family Type of Home: Mobile home Home Access: Ramped entrance     Home Layout: One level     Bathroom Shower/Tub: Occupational psychologist: Standard     Home Equipment: Shower seat - built in          Prior Functioning/Environment Prior Level of Function : Independent/Modified Independent             Mobility Comments: Independent for all mobility without AD ADLs Comments: Independent for ADLs and IADLs. Assist with nephew's care        OT Problem List: Decreased activity tolerance;Impaired balance (sitting and/or standing);Decreased knowledge of use of DME or AE      OT Treatment/Interventions: Self-care/ADL training;Therapeutic exercise;Energy conservation;DME and/or AE instruction;Therapeutic activities;Patient/family education;Balance training    OT Goals(Current goals can be found in the care plan section) Acute Rehab OT Goals Patient Stated Goal: to return home OT Goal Formulation: With patient Time For Goal Achievement: 04/29/21 Potential to Achieve Goals: Good ADL Goals Pt Will Perform Grooming: with min guard assist;standing Pt Will Perform Lower Body Bathing: with min guard assist;sit to/from stand Pt Will Transfer to Toilet: with min guard assist;ambulating;bedside commode  OT Frequency: Min  1X/week    AM-PAC OT "6 Clicks" Daily Activity     Outcome Measure Help from another person eating meals?: None Help from another person taking care of personal grooming?: A Little Help from another person toileting, which includes using toliet, bedpan, or urinal?: A Little Help from another person bathing (including washing, rinsing, drying)?: A Little Help from another person to put on and taking off regular upper body clothing?: None Help from another person to put on and taking off regular lower body clothing?: A Little 6 Click Score: 20   End of Session Equipment Utilized During Treatment: Gait belt;Rolling walker (2 wheels) Nurse Communication: Mobility status  Activity Tolerance: Patient tolerated treatment well Patient left: in chair;with call bell/phone within reach;with chair alarm set  OT Visit Diagnosis: Unsteadiness on feet (R26.81);Other abnormalities of gait and mobility (R26.89)                Time: 1000-1023 OT Time Calculation (min): 23 min Charges:  OT General Charges $OT Visit: 1 Visit OT Evaluation $OT Eval Moderate Complexity: 1 Mod OT Treatments $Self Care/Home Management : 8-22 mins  Fredirick Maudlin, OTR/L [place]

## 2021-04-15 NOTE — Progress Notes (Signed)
Physical Therapy Treatment Patient Details Name: Chelsea Barton MRN: [id number] DOB: Jun 24, 1960 Today's Date: 04/15/2021   History of Present Illness Pt is a 60 y.o. 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 PMH includes HTN, CAD, DM, CKD IV, mitral valve insufficiency.    PT Comments    Pt alert in bed, motivated for therapy and agreeable to treatment. Progressed amb distance w/ RW, SUPV while maintaining WB precautions. Pt required 1 seated rest break from fatigue. Pt education and fitting provided with cam boot with pt participation for donning/doffing. Pt continues to deny pain with movement and continues to demonstrate compliance with NWB and good control/technique of transfers and stability with WB and RW. Discharge recommendations remain HHPT.   Recommendations for follow up therapy are one component of a multi-disciplinary discharge planning process, led by the attending physician.  Recommendations may be updated based on patient status, additional functional criteria and insurance authorization.  Follow Up Recommendations  Home health PT     Assistance Recommended at Discharge Intermittent Supervision/Assistance  Equipment Recommendations  Wheelchair (measurements PT)    Recommendations for Other Services       Precautions / Restrictions Precautions Precautions: Fall Required Braces or Orthoses: Other Brace Other Brace: Cam boot Restrictions Weight Bearing Restrictions: Yes LLE Weight Bearing: Non weight bearing     Mobility  Bed Mobility Overal bed mobility: Modified Independent             General bed mobility comments: HOB elevated    Transfers Overall transfer level: Needs assistance Equipment used: Rolling walker (2 wheels) Transfers: Sit to/from Stand Sit to Stand: Supervision           General transfer comment: x1 reminder for hand placement  returning to chair, STS x 3    Ambulation/Gait Ambulation/Gait assistance: Supervision Gait Distance (Feet): 22 Feet Assistive device: Rolling walker (2 wheels)       General Gait Details: Hopping with RW x 1 seated rest break in room   Stairs             Wheelchair Mobility    Modified Rankin (Stroke Patients Only)       Balance Overall balance assessment: Needs assistance Sitting-balance support: No upper extremity supported Sitting balance-Leahy Scale: Good Sitting balance - Comments: Good sitting balance reaching outside BOS to don/doff sock   Standing balance support: Single extremity supported;During functional activity Standing balance-Leahy Scale: Poor Standing balance comment: requires BUE support                            Cognition Arousal/Alertness: Awake/alert Behavior During Therapy: WFL for tasks assessed/performed Overall Cognitive Status: Within Functional Limits for tasks assessed                                 General Comments: motivated, cooperative, pleasant        Exercises Other Exercises Other Exercises: Pt edu: donning, doffing cam boot and assembly    General Comments        Pertinent Vitals/Pain Pain Assessment: No/denies pain    Home Living Family/patient expects to be discharged to:: Private residence Living Arrangements: Other relatives (sister & nephew (who has a disability)) Available Help at Discharge: Family Type of Home: Mobile home Home Access: Ramped entrance  Home Layout: One level Home Equipment: Shower seat - built in      Prior Function            PT Goals (current goals can now be found in the care plan section) Progress towards PT goals: Progressing toward goals    Frequency    7X/week      PT Plan Current plan remains appropriate    Co-evaluation              AM-PAC PT "6 Clicks" Mobility   Outcome Measure  Help needed turning from your back  to your side while in a flat bed without using bedrails?: None Help needed moving from lying on your back to sitting on the side of a flat bed without using bedrails?: None Help needed moving to and from a bed to a chair (including a wheelchair)?: A Little Help needed standing up from a chair using your arms (e.g., wheelchair or bedside chair)?: A Little Help needed to walk in hospital room?: A Lot Help needed climbing 3-5 steps with a railing? : A Lot 6 Click Score: 18    End of Session Equipment Utilized During Treatment: Gait belt Activity Tolerance: Patient tolerated treatment well Patient left: in chair;with call bell/phone within reach;Other (comment) (OT)   PT Visit Diagnosis: Other abnormalities of gait and mobility (R26.89);Muscle weakness (generalized) (M62.81)     Time: 1505-6979 PT Time Calculation (min) (ACUTE ONLY): 38 min  Charges:                        [employer], SPT

## 2021-04-16 ENCOUNTER — Encounter: Payer: Self-pay | Admitting: Vascular Surgery

## 2021-04-16 DIAGNOSIS — L0390 Cellulitis, unspecified: Secondary | ICD-10-CM | POA: Diagnosis not present

## 2021-04-16 DIAGNOSIS — M86172 Other acute osteomyelitis, left ankle and foot: Secondary | ICD-10-CM | POA: Diagnosis not present

## 2021-04-16 LAB — CBC
HCT: 33.1 % — ABNORMAL LOW (ref 36.0–46.0)
Hemoglobin: 10.1 g/dL — ABNORMAL LOW (ref 12.0–15.0)
MCH: 26.2 pg (ref 26.0–34.0)
MCHC: 30.5 g/dL (ref 30.0–36.0)
MCV: 85.8 fL (ref 80.0–100.0)
Platelets: 287 10*3/uL (ref 150–400)
RBC: 3.86 MIL/uL — ABNORMAL LOW (ref 3.87–5.11)
RDW: 13.7 % (ref 11.5–15.5)
WBC: 12.8 10*3/uL — ABNORMAL HIGH (ref 4.0–10.5)
nRBC: 0 % (ref 0.0–0.2)

## 2021-04-16 LAB — BASIC METABOLIC PANEL
Anion gap: 7 (ref 5–15)
BUN: 22 mg/dL (ref 8–23)
CO2: 24 mmol/L (ref 22–32)
Calcium: 7.9 mg/dL — ABNORMAL LOW (ref 8.9–10.3)
Chloride: 107 mmol/L (ref 98–111)
Creatinine, Ser: 2.34 mg/dL — ABNORMAL HIGH (ref 0.44–1.00)
GFR, Estimated: 23 mL/min — ABNORMAL LOW (ref >60)
Glucose, Bld: 143 mg/dL — ABNORMAL HIGH (ref 70–99)
Potassium: 4 mmol/L (ref 3.5–5.1)
Sodium: 138 mmol/L (ref 135–145)

## 2021-04-16 LAB — GLUCOSE, CAPILLARY
Glucose-Capillary: 186 mg/dL — ABNORMAL HIGH (ref 70–99)
Glucose-Capillary: 166 mg/dL — ABNORMAL HIGH (ref 70–99)
Glucose-Capillary: 186 mg/dL — ABNORMAL HIGH (ref 70–99)
Glucose-Capillary: 201 mg/dL — ABNORMAL HIGH (ref 70–99)

## 2021-04-16 MED ORDER — HYDRALAZINE HCL 50 MG PO TABS
50.0000 mg | ORAL_TABLET | Freq: Three times a day (TID) | ORAL | Status: DC
  Administered 2021-04-16 (×3): 50 mg via ORAL
  Filled 2021-04-16 (×3): qty 1

## 2021-04-16 NOTE — TOC Progression Note (Signed)
Transition of Care [facility]) - Progression Note    Patient Details  Name: Chelsea Barton MRN: [id number] Date of Birth: 09-Jun-1961  Transition of Care [facility]) CM/SW Contact  Conception Oms, RN Phone Number: 04/16/2021, 11:19 AM  Clinical Narrative:    [facility], their first available appointment is 04/27/21 at 10 AM, the patient will need to wear a mask, bring insurance card, ID and all medications, Notified the patient of the appointment   Expected Discharge Plan: Home/Self Care Barriers to Discharge: Continued Medical Work up  Expected Discharge Plan and Services Expected Discharge Plan: Home/Self Care   Discharge Planning Services: CM Consult                     DME Arranged: Gilford Rile rolling DME Agency: [employer] Date DME Agency Contacted: 04/13/21 Time DME Agency Contacted: 59 Representative spoke with at DME Agency: [place] ([place]) Interventions    Readmission Risk Interventions No flowsheet data found.

## 2021-04-16 NOTE — Progress Notes (Signed)
[facility] Daily Progress Note  04/13/21:             1.  Introduction catheter into left lower extremity 3rd order catheter placement              2.  Contrast injection left lower extremity for distal runoff             3.  Crosser atherectomy of the left SFA              4.   Percutaneous transluminal angioplasty and stent placement left superficial femoral artery              5.  Star close closure right common femoral arteriotomy  Subjective: Patient without complaint this AM.  Resting comfortably in her bedside chair talking to family on the phone.  No acute issues overnight.  Objective: Vitals:   04/15/21 1609 04/15/21 2006 04/16/21 0441 04/16/21 0810  BP: (!) 172/64 (!) 178/61 (!) 196/69 (!) 189/74  Pulse: 71 78 72 78  Resp: 17 16 18 19   Temp: 98.3 F (36.8 C) 99.3 F (37.4 C) 98.2 F (36.8 C) 98.1 F (36.7 C)  TempSrc:  Oral Oral   SpO2: 97%  94% 96%  Weight:      Height:        Intake/Output Summary (Last 24 hours) at 04/16/2021 1127 Last data filed at 04/16/2021 1023 Gross per 24 hour  Intake 792.95 ml  Output --  Net 792.95 ml   Physical Exam: A&Ox3, NAD CV: RRR Pulmonary: CTA Bilaterally Abdomen: Soft, Nontender, Nondistended Left Groin:  Access site: Clean dry and intact.  No swelling or drainage. Vascular:  Left lower extremity: Thigh soft.  Calf soft.  Podiatry dressing was not removed and left intact.  Dressing is clean and dry.   Laboratory: CBC    Component Value Date/Time   WBC 12.8 (H) 04/16/2021 0408   HGB 10.1 (L) 04/16/2021 0408   HCT 33.1 (L) 04/16/2021 0408   PLT 287 04/16/2021 0408   BMET    Component Value Date/Time   NA 138 04/16/2021 0408   NA 137 01/20/2014 0000   K 4.0 04/16/2021 0408   CL 107 04/16/2021 0408   CO2 24 04/16/2021 0408   GLUCOSE 143 (H) 04/16/2021 0408   BUN 22 04/16/2021 0408   BUN 25 (A) 01/20/2014 0000   CREATININE 2.34 (H) 04/16/2021 0408   CALCIUM 7.9 (L) 04/16/2021 0408    GFRNONAA 23 (L) 04/16/2021 0408   GFRAA 21 (L) 11/14/2017 1349   Assessment/Planning: Chelsea Barton is a 60 y.o. 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 2) Access site is clean dry and intact. 3) Currently on Plavix and statin for medical management.  Patient with chronic kidney disease. 4) we will continue to surveilled the patient's disease in the outpatient setting. 5) at this time, vascular surgery will sign off please reach out with any questions or reconsult if needed.  Discussed with Dr. Ellis Parents Aliz Meritt PA-C 04/16/2021 11:27 AM

## 2021-04-16 NOTE — Progress Notes (Signed)
Physical Therapy Treatment Patient Details Name: Chelsea Barton MRN: [id number] DOB: 1960-10-02 Today's Date: 04/16/2021   History of Present Illness Pt is a 60 y.o. 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 PMH includes HTN, CAD, DM, CKD IV, mitral valve insufficiency.    PT Comments    Pt received after being up in chair for 3 hours. Completed gait training in room with RW, NWB L LE, with Supervision and 2 standing rest breaks due to fatigue.  Pt has a personal w/c for long distances. Reviewed LE exercises, educated pt on weight bearing restrictions and importance. Pt very compliant, stating she is ready to d/c home once medically cleared. Continue to recommend HHPT.   Recommendations for follow up therapy are one component of a multi-disciplinary discharge planning process, led by the attending physician.  Recommendations may be updated based on patient status, additional functional criteria and insurance authorization.  Follow Up Recommendations  Home health PT     Assistance Recommended at Discharge Intermittent Supervision/Assistance  Equipment Recommendations  Wheelchair (measurements PT)    Recommendations for Other Services       Precautions / Restrictions Precautions Precautions: Fall Restrictions Weight Bearing Restrictions: Yes LLE Weight Bearing: Non weight bearing     Mobility  Bed Mobility Overal bed mobility: Modified Independent             General bed mobility comments: HOB elevated    Transfers Overall transfer level: Needs assistance Equipment used: Rolling walker (2 wheels) Transfers: Sit to/from Stand Sit to Stand: Supervision          Lateral/Scoot Transfers: Supervision      Ambulation/Gait Ambulation/Gait assistance: Supervision Gait Distance (Feet): 35 Feet Assistive device: Rolling walker (2 wheels) Gait Pattern/deviations:   (Hop to pattern)     General Gait Details:  (2 standing rest breaks due to fatigue)   Stairs             Wheelchair Mobility    Modified Rankin (Stroke Patients Only)       Balance                                            Cognition Arousal/Alertness: Awake/alert Behavior During Therapy: WFL for tasks assessed/performed Overall Cognitive Status: Within Functional Limits for tasks assessed                                 General Comments: motivated, cooperative, pleasant        Exercises      General Comments General comments (skin integrity, edema, etc.):  (Left distal foot with dressing in tact, no drainage noted.)      Pertinent Vitals/Pain Pain Assessment: No/denies pain    Home Living                          Prior Function            PT Goals (current goals can now be found in the care plan section) Acute Rehab PT Goals Patient Stated Goal: To go home Progress towards PT goals: Progressing toward goals    Frequency    7X/week      PT Plan Current plan remains appropriate  Co-evaluation              AM-PAC PT "6 Clicks" Mobility   Outcome Measure  Help needed turning from your back to your side while in a flat bed without using bedrails?: None Help needed moving from lying on your back to sitting on the side of a flat bed without using bedrails?: None Help needed moving to and from a bed to a chair (including a wheelchair)?: A Little Help needed standing up from a chair using your arms (e.g., wheelchair or bedside chair)?: A Little Help needed to walk in hospital room?: A Little Help needed climbing 3-5 steps with a railing? : A Lot 6 Click Score: 19    End of Session Equipment Utilized During Treatment: Gait belt Activity Tolerance: Patient tolerated treatment well Patient left: in bed;with call bell/phone within reach;with bed alarm set Nurse Communication: Mobility status PT  Visit Diagnosis: Other abnormalities of gait and mobility (R26.89);Muscle weakness (generalized) (M62.81)     Time: 5885-0277 PT Time Calculation (min) (ACUTE ONLY): 25 min  Charges:  $Gait Training: 8-22 mins $Therapeutic Exercise: 8-22 mins           Mikel Cella, PTA    Josie Dixon 04/16/2021, 12:40 PM

## 2021-04-16 NOTE — TOC Progression Note (Signed)
Transition of Care [facility]) - Progression Note    Patient Details  Name: Chelsea Barton MRN: [id number] Date of Birth: 1960-07-02  Transition of Care [facility]) CM/SW [place], RN Phone Number: 04/16/2021, 9:10 AM  Clinical Narrative:   Met with the patient in the room to discuss needs, She is agreeable to go to Outpatient Wound care, She stated she would like HH for PT, she stated that she is open with [place] and has been a long time, I reached out to [place] with [facility] to see if they will see the patient for Wound care and PT, she stated that she is aware that Medicaid has transportation services;, however she does not like to use it, She stated that her sister is the person that provides transportation for appointments when she is available. Awaiting a response from [place]    Expected Discharge Plan: Home/Self Care Barriers to Discharge: Continued Medical Work up  Expected Discharge Plan and Services Expected Discharge Plan: Home/Self Care   Discharge Planning Services: CM Consult                     DME Arranged: Gilford Rile rolling DME Agency: [employer] Date DME Agency Contacted: 04/13/21 Time DME Agency Contacted: 78 Representative spoke with at DME Agency: [place] Determinants of Health ([place]) Interventions    Readmission Risk Interventions No flowsheet data found.

## 2021-04-16 NOTE — Progress Notes (Signed)
Results for Almon, Liliana D (MRN [id number]) as of 04/16/2021 11:42  Ref. Range 04/15/2021 07:42 04/15/2021 12:39 04/15/2021 16:59 04/15/2021 22:05  Glucose-Capillary Latest Ref Range: 70 - 99 mg/dL 126 (H) 176 (H) 200 (H) 163 (H)  Results for Trevor, Maegan D (MRN [id number]) as of 04/16/2021 11:42  Ref. Range 04/16/2021 08:12  Glucose-Capillary Latest Ref Range: 70 - 99 mg/dL 186 (H)    Diabetes coordinator team was paged on 04/11/21 by Phyllis Briggs, RN regarding patient's Dexcom CGM being removed prior to MRI and patient requested replacement Dexcom sensor. RN was informed that inpatient diabetes team did not have any Dexcom CGM sensors but we did have samples of FreeStyle Libre2 CGMs.  Diabetes RN secured order from Dr. Austria to give pt a Freestyle Libre 2 sensor until her Dexcom sensor refill can be picked up.    Met w/ pt this AM to help her download the Freestyle Libre 2 app to her phone and help her apply the sensor prior to d/c.  Pt told me she has her Dexcom sensor reader and has a refill of the actual sensor waiting for her at [facility] and plans to pick up the sensor as soon as she is discharged.  Politely declined the Freestyle Libre sensor.   --Will follow patient during hospitalization--  Jeannine Johnston Fishel RN, MSN, CDE Diabetes Coordinator Inpatient Glycemic Control Team Team Pager: [phone] (8a-5p)   

## 2021-04-16 NOTE — Progress Notes (Signed)
PROGRESS NOTE    Chelsea Barton  QQV:[id number] DOB: [birth year] DOA: 04/10/2021 PCP: Burnard Hawthorne, FNP    Brief Narrative:  Chelsea Barton is a 59 year old female with past medical history significant for type 2 diabetes mellitus, CKD stage IV, essential hypertension, hyperlipidemia, LVH, mitral insufficiency, secondary hyperparathyroidism, hyperlipidemia who presented to [facility] ED on 10/25 with left foot pain and swelling.  Onset roughly 2 days ago.  Denies any trauma.   In the ED, temperature 98.4 F, HR 72, RR 18, BP 140/55, SPO2 97% on room air.  Sodium 138, potassium 4.1, chloride 102, CO2 26, glucose 213, BUN 40, creatinine 2.70.  Lactic acid 1.9.  WBC 26.1, hemoglobin 11.3, platelets 243.  INR 1.3.  COVID-19 PCR negative.  Influenza A/B PCR negative.  X-ray left foot with comminuted and displaced second proximal phalanx fracture, soft tissue emphysema involving the digits and forefoot around the fracture suggestive of necrotizing soft tissue infection.  Podiatry was consulted.  Patient was started empiric antibiotics.  TRH consulted for further evaluation and management of left foot diabetic infection.   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 X-ray left foot with comminuted and displaced second proximal phalanx fracture, soft tissue emphysema involving the digits and forefoot around the fracture suggestive of  necrotizing soft tissue infection.  Patient underwent incision and drainage and left second toe amputation by podiatry, Dr. Amalia Hailey on 04/10/2021.  MRI left foot with hyperintense signal Sica metatarsal head with adjacent skin defect concerning for vasculitis, marked soft tissue edema about amputation site, diabetic myopathy/myositis of the plantar muscles without drainable fluid collection/abscess.  Underwent transmetatarsal amputation by podiatry, Dr. Posey Pronto on 04/12/2021. --Podiatry following, appreciate assistance --WBC 26.1>19.7>19.5>16.1>16.0>12.8 --ESR 72 --CRP 27.8 --Blood cultures x2: no growth x 5 days --Operative culture 10/25: few GPCs, Mod GNRs, Mod GPCs; Prevotella disiens --Zyvox 600 mg IV q12h --Zosyn 3.$RemoveBefore'375mg'[id number]$  IV q8h --Oxycodone 5 mg q4h prn moderate pain --Morphine 2 mg IV q2h PRN severe pain --Not weightbearing left lower extremity with cam walker; plan outpatient PT and follow-up of [place] [facility]. --Awaiting further recommendations per podiatry.  Peripheral vascular disease Ultrasound arterial ABI 10/26 with moderate to severe left lower extremity peripheral artery disease with ABI 0.53 and mild right lower extremity peripheral artery disease with ABI 0.90.  Underwent angiogram by vascular surgery on 04/13/2021 with arthrectomy left SFA and angioplasty with stent placement left superficial femoral artery. --Plavix 75 mg p.o. daily --Vascular surgery plans outpatient follow-up  Type 2 diabetes mellitus with hyperglycemia Hemoglobin A1c 7.8 and 04/10/2021, not optimally controlled.  Home regimen includes Lantus 40 units apparently daily, Farxiga 10 mg p.o. daily. --Semglee 20u [place] QHS --NovoLog SSI for further coverage --CBGs qAC/HS  Acute renal failure on CKD stage IV Creatinine elevated 2.70 on admission, baseline 2.3-2.4.  Etiology likely secondary to acute infectious process as above. --Cr 2.70>2.62>2.52>2.40>2.45>2.48>2.34  --Holding home enalapril --Avoid  nephrotoxins, renal dose all medications --BMP daily  Essential  hypertension Home regimen includes carvedilol 12.5 mg p.o. twice daily, enalapril 40 mg p.o. daily, digoxin 0.15 mg p.o. daily, nifedipine 30 mg p.o. daily, hydralazine prn. --Carvedilol 12.5 mg p.o. twice daily --Digoxin 0.125 mg p.o. daily --Increased nifedipine to 60 mg p.o. daily --Hydralazine 17m PO q8h --Holding enalapril due to AKI as above.  Hyperlipidemia HDL 25, LDL 42. --continue home Crestor 10 mg p.o. daily  Obesity Body mass index is 29.95 kg/m.  Discussed with patient needs for aggressive lifestyle changes/weight loss as this complicates all facets of care.  Outpatient follow-up with PCP.     DVT prophylaxis: SCDs Start: 04/10/21 2138 Place TED hose Start: 04/10/21 1455   Code Status: Full Code Family Communication: No family present at bedside this morning.  Disposition Plan:  Level of care: Med-Surg Status is: Inpatient  Remains inpatient appropriate because: pending further vascular surgical intervention, IV antibiotics    Consultants:  Podiatry, Dr. EAmalia HaileyVascular surgery, Dr. SDelana Meyer Procedures:  Incision and drainage left foot with left second toe amputation, podiatry Dr. EAmalia Hailey10/25 Left foot transmetatarsal amputation, podiatry Dr. PPosey Pronto10/27.  Antimicrobials:  Zyvox 10/25>> Zosyn 10/25>> Vancomycin 10/25 - 10/25 Clindamycin 10/25 - 10/25    Subjective: Patient seen examined bedside, resting comfortably.  Eating breakfast.  No specific complaints this morning.  Seen by vascular surgery this morning and signing off and will follow-up outpatient.  Awaiting further recommendations from podiatry.  No other complaints or concerns at this time.  Hopes she can DC home soon. Denies headache, no current fever/chills/night sweats, no nausea/vomiting/diarrhea, no chest pain, palpitations, no abdominal pain, no weakness, no fatigue, no paresthesias.  No acute events overnight per nursing  staff.  Objective: Vitals:   04/15/21 1609 04/15/21 2006 04/16/21 0441 04/16/21 0810  BP: (!) 172/64 (!) 178/61 (!) 196/69 (!) 189/74  Pulse: 71 78 72 78  Resp: _0 Temp: 98.3 F (36.8 C) 99.3 F (37.4 C) 98.2 F (36.8 C) 98.1 F (36.7 C)  TempSrc:  Oral Oral   SpO2: 97%  94% 96%  Weight:      Height:        Intake/Output Summary (Last 24 hours) at 04/16/2021 1248 Last data filed at 04/16/2021 1023 Gross per 24 hour  Intake 792.95 ml  Output --  Net 792.95 ml   Filed Weights   04/10/21 1242 04/12/21 1537 04/13/21 1040  Weight: 86.2 kg 86.2 kg 81.6 kg    Examination:  General exam: Appears calm and comfortable, obese Respiratory system: Clear to auscultation. Respiratory effort normal.  On room air Cardiovascular system: S1 & S2 heard, RRR. No JVD, murmurs, rubs, gallops or clicks. No pedal edema. Gastrointestinal system: Abdomen is nondistended, soft and nontender. No organomegaly or masses felt. Normal bowel sounds heard. Central nervous system: Alert and oriented. No focal neurological deficits. Extremities: Symmetric 5 x 5 power.  Left foot with dressing/Ace wrap in place, clean/dry/intact Skin: Left foot wound with dressing/ACE Wrap in place, clean/dry/intact; otherwise no rashes, lesions or ulcers Psychiatry: Judgement and insight appear normal. Mood & affect appropriate.     Data Reviewed: I have personally reviewed following labs and imaging studies  CBC: Recent Labs  Lab 04/10/21 1357 04/11/21 0245 04/12/21 0429 04/13/21 0449 04/14/21 0433 04/15/21 0427 04/16/21 0408  WBC 26.1*   < > 19.5* 16.1* 16.0* 14.3* 12.8*  NEUTROABS 21.5*  --   --   --   --   --   --   HGB 11.3*   < >  11.2* 11.3* 10.9* 10.6* 10.1*  HCT 35.6*   < > 35.1* 34.9* 35.7* 33.3* 33.1*  MCV 87.9   < > 84.8 86.0 86.2 86.3 85.8  PLT 243   < > 259 274 301 284 287   < > = values in this interval not displayed.   Basic Metabolic Panel: Recent Labs  Lab 04/12/21 0429  04/13/21 0449 04/14/21 0433 04/15/21 0427 04/16/21 0408  NA 136 138 140 139 138  K 3.9 4.0 4.3 4.3 4.0  CL 102 106 107 105 107  CO2 _0 GLUCOSE 348* 127* 112* 117* 143*  BUN 29* _1 CREATININE 2.52* 2.40* 2.45* 2.48* 2.34*  CALCIUM 8.1* 8.0* 8.2* 8.5* 7.9*   GFR: Estimated Creatinine Clearance: 26.6 mL/min (A) (by C-G formula based on SCr of 2.34 mg/dL (H)). Liver Function Tests: Recent Labs  Lab 04/10/21 1357 04/11/21 0245  AST 22 20  ALT 20 17  ALKPHOS 108 103  BILITOT 1.1 0.8  PROT 7.5 6.8  ALBUMIN 2.8* 2.6*   No results for input(s): LIPASE, AMYLASE in the last 168 hours. No results for input(s): AMMONIA in the last 168 hours. Coagulation Profile: Recent Labs  Lab 04/10/21 1357  INR 1.3*   Cardiac Enzymes: No results for input(s): CKTOTAL, CKMB, CKMBINDEX, TROPONINI in the last 168 hours. BNP (last 3 results) No results for input(s): PROBNP in the last 8760 hours. HbA1C: No results for input(s): HGBA1C in the last 72 hours.  CBG: Recent Labs  Lab 04/15/21 0742 04/15/21 1239 04/15/21 1659 04/15/21 2205 04/16/21 0812  GLUCAP 126* 176* 200* 163* 186*   Lipid Profile: No results for input(s): CHOL, HDL, LDLCALC, TRIG, CHOLHDL, LDLDIRECT in the last 72 hours.  Thyroid Function Tests: No results for input(s): TSH, T4TOTAL, FREET4, T3FREE, THYROIDAB in the last 72 hours. Anemia Panel: No results for input(s): VITAMINB12, FOLATE, FERRITIN, TIBC, IRON, RETICCTPCT in the last 72 hours. Sepsis Labs: Recent Labs  Lab 04/10/21 1357 04/10/21 1637  LATICACIDVEN 1.9 1.5    Recent Results (from the past 240 hour(s))  Resp Panel by RT-PCR (Flu A&B, Covid) Nasopharyngeal Swab     Status: None   Collection Time: 04/10/21  2:28 PM   Specimen: Nasopharyngeal Swab; Nasopharyngeal(NP) swabs in vial transport medium  Result Value Ref Range Status   SARS Coronavirus 2 by RT PCR NEGATIVE NEGATIVE Final    Comment: (NOTE) SARS-CoV-2 target  nucleic acids are NOT DETECTED.  The SARS-CoV-2 RNA is generally detectable in upper respiratory specimens during the acute phase of infection. The lowest concentration of SARS-CoV-2 viral copies this assay can detect is 138 copies/mL. A negative result does not preclude SARS-Cov-2 infection and should not be used as the sole basis for treatment or other patient management decisions. A negative result may occur with  improper specimen collection/handling, submission of specimen other than nasopharyngeal swab, presence of viral mutation(s) within the areas targeted by this assay, and inadequate number of viral copies(<138 copies/mL). A negative result must be combined with clinical observations, patient history, and epidemiological information. The expected result is Negative.  Fact Sheet for Patients:  EntrepreneurPulse.com.au  Fact Sheet for Healthcare Providers:  IncredibleEmployment.be  This test is no t yet approved or cleared by the [place] FDA and  has been authorized for detection and/or diagnosis of SARS-CoV-2 by FDA under an Emergency Use Authorization (EUA). This EUA will remain  in effect (meaning this test can be used) for the duration of the  COVID-19 declaration under Section 564(b)(1) of the Act, 21 U.S.C.section 360bbb-3(b)(1), unless the authorization is terminated  or revoked sooner.       Influenza A by PCR NEGATIVE NEGATIVE Final   Influenza B by PCR NEGATIVE NEGATIVE Final    Comment: (NOTE) The Xpert Xpress SARS-CoV-2/FLU/RSV plus assay is intended as an aid in the diagnosis of influenza from Nasopharyngeal swab specimens and should not be used as a sole basis for treatment. Nasal washings and aspirates are unacceptable for Xpert Xpress SARS-CoV-2/FLU/RSV testing.  Fact Sheet for Patients: EntrepreneurPulse.com.au  Fact Sheet for Healthcare  Providers: IncredibleEmployment.be  This test is not yet approved or cleared by the [place] FDA and has been authorized for detection and/or diagnosis of SARS-CoV-2 by FDA under an Emergency Use Authorization (EUA). This EUA will remain in effect (meaning this test can be used) for the duration of the COVID-19 declaration under Section 564(b)(1) of the Act, 21 U.S.C. section 360bbb-3(b)(1), unless the authorization is terminated or revoked.  Performed at [facility], [address] [place], [place] [address]   Blood culture (routine single)     Status: None   Collection Time: 04/10/21  2:37 PM   Specimen: BLOOD  Result Value Ref Range Status   Specimen Description BLOOD BLOOD LEFT FOREARM  Final   Special Requests   Final    BOTTLES DRAWN AEROBIC AND ANAEROBIC Blood Culture adequate volume   Culture   Final    NO GROWTH 5 DAYS Performed at [facility], [address] [place], [place] [address]    Report Status 04/15/2021 FINAL  Final  Culture, blood (single)     Status: None   Collection Time: 04/10/21  4:32 PM   Specimen: BLOOD  Result Value Ref Range Status   Specimen Description BLOOD BLOOD RIGHT HAND  Final   Special Requests   Final    BOTTLES DRAWN AEROBIC AND ANAEROBIC Blood Culture adequate volume   Culture   Final    NO GROWTH 5 DAYS Performed at [facility], [address], [place], [place] [address]    Report Status 04/15/2021 FINAL  Final  Aerobic/Anaerobic Culture w Gram Stain (surgical/deep wound)     Status: None   Collection Time: 04/10/21  8:34 PM   Specimen: PATH Other; Wound  Result Value Ref Range Status   Specimen Description   Final    FOOT LEFT Performed at [facility], [address], [place], [place] [address]    Special Requests   Final    NONE Performed at [facility], [place], [place] [address]    Gram Stain   Final    FEW SQUAMOUS EPITHELIAL  CELLS PRESENT FEW WBC SEEN FEW GRAM POSITIVE RODS MODERATE GRAM NEGATIVE RODS MODERATE GRAM POSITIVE COCCI    Culture   Final    MODERATE PREVOTELLA DISIENS BETA LACTAMASE POSITIVE NORMAL SKIN FLORA Performed at [facility], [place] [address] [place], [place] [address]    Report Status 04/15/2021 FINAL  Final         Radiology Studies: No results found.      Scheduled Meds:  carvedilol  12.5 mg Oral BID WC   clopidogrel  75 mg Oral Q breakfast   dapagliflozin propanediol  10 mg Oral Daily   digoxin  125 mcg Oral Daily   hydrALAZINE  50 mg Oral Q8H   insulin aspart  0-15 Units Subcutaneous TID WC   insulin aspart  0-5 Units Subcutaneous QHS   insulin glargine-yfgn  20 Units Subcutaneous QHS   NIFEdipine  60 mg Oral Daily   rosuvastatin  10 mg Oral QHS   sodium chloride flush  3 mL Intravenous Q12H   Continuous Infusions:  sodium chloride 250 mL (04/13/21 2256)   linezolid (ZYVOX) IV 600 mg (04/16/21 0453)   piperacillin-tazobactam (ZOSYN)  IV 3.375 g (04/16/21 0620)     LOS: 5 days    Time spent: 36 minutes spent on chart review, discussion with nursing staff, consultants, updating family and interview/physical exam; more than 50% of that time was spent in counseling and/or coordination of care.    Nehemias Sauceda J [place] DO [facility] Available via Epic secure chat 7am-7pm After these hours, please refer to coverage provider listed on [email or web address] 04/16/2021, 12:48 PM

## 2021-04-17 LAB — BASIC METABOLIC PANEL
Anion gap: 10 (ref 5–15)
BUN: 23 mg/dL (ref 8–23)
CO2: 24 mmol/L (ref 22–32)
Calcium: 8.3 mg/dL — ABNORMAL LOW (ref 8.9–10.3)
Chloride: 102 mmol/L (ref 98–111)
Creatinine, Ser: 2.43 mg/dL — ABNORMAL HIGH (ref 0.44–1.00)
GFR, Estimated: 22 mL/min — ABNORMAL LOW (ref >60)
Glucose, Bld: 248 mg/dL — ABNORMAL HIGH (ref 70–99)
Potassium: 3.7 mmol/L (ref 3.5–5.1)
Sodium: 136 mmol/L (ref 135–145)

## 2021-04-17 LAB — GLUCOSE, CAPILLARY
Glucose-Capillary: 205 mg/dL — ABNORMAL HIGH (ref 70–99)
Glucose-Capillary: 165 mg/dL — ABNORMAL HIGH (ref 70–99)
Glucose-Capillary: 179 mg/dL — ABNORMAL HIGH (ref 70–99)
Glucose-Capillary: 160 mg/dL — ABNORMAL HIGH (ref 70–99)

## 2021-04-17 LAB — CBC
HCT: 32.7 % — ABNORMAL LOW (ref 36.0–46.0)
Hemoglobin: 10.3 g/dL — ABNORMAL LOW (ref 12.0–15.0)
MCH: 27.3 pg (ref 26.0–34.0)
MCHC: 31.5 g/dL (ref 30.0–36.0)
MCV: 86.7 fL (ref 80.0–100.0)
Platelets: 295 10*3/uL (ref 150–400)
RBC: 3.77 MIL/uL — ABNORMAL LOW (ref 3.87–5.11)
RDW: 13.8 % (ref 11.5–15.5)
WBC: 13.3 10*3/uL — ABNORMAL HIGH (ref 4.0–10.5)
nRBC: 0 % (ref 0.0–0.2)

## 2021-04-17 LAB — SURGICAL PATHOLOGY

## 2021-04-17 MED ORDER — HYDRALAZINE HCL 25 MG PO TABS
25.0000 mg | ORAL_TABLET | Freq: Once | ORAL | Status: AC
  Administered 2021-04-17: 25 mg via ORAL
  Filled 2021-04-17: qty 1

## 2021-04-17 MED ORDER — CARVEDILOL 25 MG PO TABS
25.0000 mg | ORAL_TABLET | Freq: Two times a day (BID) | ORAL | Status: DC
  Administered 2021-04-17 – 2021-04-24 (×14): 25 mg via ORAL
  Filled 2021-04-17 (×15): qty 1

## 2021-04-17 MED ORDER — INSULIN GLARGINE-YFGN 100 UNIT/ML ~~LOC~~ SOLN
25.0000 [IU] | Freq: Every day | SUBCUTANEOUS | Status: DC
  Administered 2021-04-17 – 2021-04-23 (×7): 25 [IU] via SUBCUTANEOUS
  Filled 2021-04-17 (×8): qty 0.25

## 2021-04-17 MED ORDER — HYDRALAZINE HCL 50 MG PO TABS
100.0000 mg | ORAL_TABLET | Freq: Three times a day (TID) | ORAL | Status: DC
  Administered 2021-04-17 – 2021-04-24 (×21): 100 mg via ORAL
  Filled 2021-04-17 (×22): qty 2

## 2021-04-17 NOTE — Progress Notes (Signed)
Occupational Therapy Treatment Patient Details Name: Chelsea Barton MRN: [id number] DOB: March 08, 1961 Today's Date: 04/17/2021   History of present illness Pt is a 60 y.o. 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 PMH includes HTN, CAD, DM, CKD IV, mitral valve insufficiency.   OT comments  Chart reviewed, RN cleared pt for participation in OT tx session. Pt greeted in bed, agreeable to session. Tx session targeted improving independence during grooming tasks and progressing functional mobility in preparation for toilet/shower transfers. Pt with noted carry over of techniques to maintain NWBing and is making progress towards goals. Good tolerance for tx session with no rest breaks required. Pt is left in bedside chair, NAD, all needs met. RN aware of pt status. OT continues to recommend discharge home with HHOT as pt is not performing ADL/IADL at [facility] and would benefit from ongoing OT to address functional deficits. OT will continue to follow while admitted.    Recommendations for follow up therapy are one component of a multi-disciplinary discharge planning process, led by the attending physician.  Recommendations may be updated based on patient status, additional functional criteria and insurance authorization.    Follow Up Recommendations  Home health OT    Assistance Recommended at Discharge Intermittent Supervision/Assistance  Equipment Recommendations  [facility]    Recommendations for Other Services      Precautions / Restrictions Precautions Precautions: Fall Restrictions Weight Bearing Restrictions: Yes LLE Weight Bearing: Non weight bearing       Mobility Bed Mobility Overal bed mobility: Modified Independent             General bed mobility comments: HOB flat- simulating home set up    Transfers Overall transfer level: Needs assistance Equipment used: Rolling walker (2  wheels) Transfers: Sit to/from Stand;Stand Pivot Transfers Sit to Stand: Min guard Stand pivot transfers: Supervision         General transfer comment: good carry over of NWBing LLE techniques from previous therapy tx sessions per chart review     Balance Overall balance assessment: Needs assistance Sitting-balance support: No upper extremity supported Sitting balance-Leahy Scale: Good     Standing balance support: Bilateral upper extremity supported;Reliant on assistive device for balance Standing balance-Leahy Scale: Fair Standing balance comment: no gross loss of balance. stand by assistance for safety                           ADL either performed or assessed with clinical judgement   ADL Overall ADL's : Needs assistance/impaired                                       General ADL Comments: SET UP for grooming tasks at edge of bed; STS 5x in preparation for functional transfers to shower/toilet with CGA; short ambulatory transfer with RW approx 5 feet to bedside chair with CLOSE SUP     Vision       Perception     Praxis      Cognition Arousal/Alertness: Awake/alert Behavior During Therapy: WFL for tasks assessed/performed Overall Cognitive Status: Within Functional Limits for tasks assessed                                 General  Comments: alert and oriented x4, pleasant and agreeable          Exercises Other Exercises Other Exercises: patient performed partial squat in standing x 10 reps RLE with rolling walker for UE support in standing, no loss of balance   Shoulder Instructions       General Comments good safety awareness throughout tx session    Pertinent Vitals/ Pain       Pain Assessment: No/denies pain Pain Score: 0-No pain  Home Living                                          Prior Functioning/Environment              Frequency  Min 1X/week        Progress Toward  Goals  OT Goals(current goals can now be found in the care plan section)  Progress towards OT goals: Progressing toward goals  Acute Rehab OT Goals Patient Stated Goal: to go home OT Goal Formulation: With patient  Plan Discharge plan remains appropriate    Co-evaluation                 AM-PAC OT "6 Clicks" Daily Activity     Outcome Measure   Help from another person eating meals?: None Help from another person taking care of personal grooming?: None Help from another person toileting, which includes using toliet, bedpan, or urinal?: A Little Help from another person bathing (including washing, rinsing, drying)?: A Little Help from another person to put on and taking off regular upper body clothing?: None Help from another person to put on and taking off regular lower body clothing?: A Little 6 Click Score: 21    End of Session Equipment Utilized During Treatment: Gait belt;Rolling walker (2 wheels)  OT Visit Diagnosis: Unsteadiness on feet (R26.81);Other abnormalities of gait and mobility (R26.89)   Activity Tolerance Patient tolerated treatment well   Patient Left in chair;with call bell/phone within reach;with chair alarm set   Nurse Communication Mobility status        Time: 6440-3474 OT Time Calculation (min): 21 min  Charges: OT General Charges $OT Visit: 1 Visit OT Treatments $Self Care/Home Management : 8-22 mins  Shanon Payor, OTD OTR/L  04/17/21, 11:23 AM

## 2021-04-17 NOTE — Progress Notes (Signed)
   PODIATRY PROGRESS NOTE  NAME Chelsea Barton MRN [id number] DOB 1961/02/09 DOA 04/10/2021   CC: diabetic foot infection  Chief Complaint  Patient presents with   Foot Pain   History of present illness: 60 y.o. female s/p transmetatarsal amputation left foot.  DOS: 04/13/2021.  Peripheral vascular balloon angioplasty 04/13/2021.  Last visit by podiatry on 04/16/2021 was noted that there was some purulence along the plantar flap of the amputation stump.  Some of the sutures released and there was some purulent drainage from the amputation stump.  Concerning for residual infection within the foot.  Patient resting comfortably bedside  Past Medical History:  Diagnosis Date   Diabetes mellitus    Hyperlipidemia    Hypertension     CBC Latest Ref Rng & Units 04/17/2021 04/16/2021 04/15/2021  WBC 4.0 - 10.5 K/uL 13.3(H) 12.8(H) 14.3(H)  Hemoglobin 12.0 - 15.0 g/dL 10.3(L) 10.1(L) 10.6(L)  Hematocrit 36.0 - 46.0 % 32.7(L) 33.1(L) 33.3(L)  Platelets 150 - 400 K/uL 295 287 284    BMP Latest Ref Rng & Units 04/17/2021 04/16/2021 04/15/2021  Glucose 70 - 99 mg/dL 248(H) 143(H) 117(H)  BUN 8 - 23 mg/dL 23 22 20   Creatinine 0.44 - 1.00 mg/dL 2.43(H) 2.34(H) 2.48(H)  Sodium 135 - 145 mmol/L 136 138 139  Potassium 3.5 - 5.1 mmol/L 3.7 4.0 4.3  Chloride 98 - 111 mmol/L 102 107 105  CO2 22 - 32 mmol/L 24 24 24   Calcium 8.9 - 10.3 mg/dL 8.3(L) 7.9(L) 8.5(L)         Physical Exam: General: The patient is alert and oriented x3 in no acute distress.   Concerning plantar flap noted to the surgical foot.  There continues to be drainage from the area.  There is a concern for residual infection within the foot.  No significant malodor.  Please see attached photos.  ASSESSMENT/PLAN OF CARE 1.  S/p transmetatarsal amputation LT foot 04/13/2021 -Concern for residual infection.  We will plan to return to the OR tomorrow, 04/18/2021, with my partner Dr. Sherryle Lis for irrigation and debridement.   Tentative plan to leave the flap open for drainage and delayed primary closure after 48 hours if the infection resolves and the flap improves -Continue nonweightbearing surgical foot -Podiatry will continue to follow      Thank you for the consult.  Please contact me directly with any questions or concerns.  Cell [phone]   Edrick Kins, DPM [facility]  Dr. Edrick Kins, DPM    [address], [place] [address]                Office [phone]  Fax [phone]

## 2021-04-17 NOTE — Progress Notes (Signed)
Patient's BP 186/64 after administration of PRN hydralazine for treatment of BP of 186/64. Dr. Damita Dunnings notified. New order given. See Mar

## 2021-04-17 NOTE — Progress Notes (Signed)
Inpatient Diabetes Program Recommendations  AACE/ADA: New Consensus Statement on Inpatient Glycemic Control (2015)  Target Ranges:  Prepandial:   less than 140 mg/dL      Peak postprandial:   less than 180 mg/dL (1-2 hours)      Critically ill patients:  140 - 180 mg/dL   Lab Results  Component Value Date   GLUCAP 201 (H) 04/16/2021   HGBA1C 7.8 (H) 04/10/2021    Review of Glycemic Control Results for EVEA, SHEEK (MRN [id number]) as of 04/17/2021 09:34  Ref. Range 04/16/2021 08:12 04/16/2021 13:03 04/16/2021 17:26 04/16/2021 21:26  Glucose-Capillary Latest Ref Range: 70 - 99 mg/dL 186 (H) 166 (H) 186 (H) 201 (H)  Home DM Meds: Lantus 40 units daily                             Novolog 5 units Breakfast/ 10 units Lunch/ 10 units Dinner                             Farxiga 10 mg daily     Current Orders: Semglee 20 units QHS  Novolog 0-15 units TID ac/hs Farxiga 10 mg daily  Inpatient Diabetes Program Recommendations:    Consider increasing Semglee to 25 units q HS.   Thanks, Adah Perl, RN, BC-ADM Inpatient Diabetes Coordinator Pager [phone]  (8a-5p)

## 2021-04-17 NOTE — Progress Notes (Signed)
  Subjective:  Patient ID: Chelsea Barton, female    DOB: 05/11/1961,  MRN: [id number]  Patient seen at bedside resting comfortably says she is ready to leave the hospital  Negative for chest pain and shortness of breath Chest pain: no Shortness of breath: no Constitutional signs: no Review of all other systems is negative Objective:   Vitals:   04/17/21 0637 04/17/21 0742  BP: (!) 169/64 (!) 193/74  Pulse: 74 77  Resp:  16  Temp:  98.7 F (37.1 C)  SpO2: 97% 94%   General AA&O x3. Normal mood and affect.  Vascular Weak pulses foot is warm capillary fill time intact left foot  Neurologic Epicritic sensation grossly absent.  Dermatologic Plantar flap with dusky area increasing necrosis watery drainage, I removed several sutures and express quite a bit of watery drainage from the foot  Orthopedic: MMT 5/5 in dorsiflexion, plantarflexion, inversion, and eversion.      Assessment & Plan:  Patient was evaluated and treated and all questions answered.  Left foot diabetic foot infection status post transmetatarsal amputation -Unfortunately appears to have necrosis in the plantar flap, I removed several of the sutures and irrigated it thoroughly with sterile saline.  Watery necrotic drainage expressed from the wound. -Dr. Amalia Hailey will see tomorrow and change again, if she has increasing necrosis may need to take for an I&D on Wednesday, persistent leukocytosis this far out from her last procedure is concerning for possible re infection -We will continue to follow  [name], DPM  Accessible via secure chat for questions or concerns.

## 2021-04-17 NOTE — Plan of Care (Signed)
Safety maintained. Hypertensive during the night. Patient remained asymptomatic. Received 2 doses of Hydralazine 25 mg PO. Most recent BP 169/64. No complaints of pain.  Problem: Education: Goal: Knowledge of General Education information will improve Description: Including pain rating scale, medication(s)/side effects and non-pharmacologic comfort measures Outcome: Progressing   Problem: Health Behavior/Discharge Planning: Goal: Ability to manage health-related needs will improve Outcome: Progressing   Problem: Clinical Measurements: Goal: Ability to maintain clinical measurements within normal limits will improve Outcome: Progressing Goal: Will remain free from infection Outcome: Progressing Goal: Diagnostic test results will improve Outcome: Progressing Goal: Respiratory complications will improve Outcome: Progressing Goal: Cardiovascular complication will be avoided Outcome: Progressing   Problem: Activity: Goal: Risk for activity intolerance will decrease Outcome: Progressing   Problem: Nutrition: Goal: Adequate nutrition will be maintained Outcome: Progressing   Problem: Coping: Goal: Level of anxiety will decrease Outcome: Progressing   Problem: Elimination: Goal: Will not experience complications related to bowel motility Outcome: Progressing Goal: Will not experience complications related to urinary retention Outcome: Progressing   Problem: Pain Managment: Goal: General experience of comfort will improve Outcome: Progressing   Problem: Safety: Goal: Ability to remain free from injury will improve Outcome: Progressing   Problem: Skin Integrity: Goal: Risk for impaired skin integrity will decrease Outcome: Progressing

## 2021-04-17 NOTE — Progress Notes (Signed)
Physical Therapy Treatment Patient Details Name: Chelsea Barton MRN: [id number] DOB: [birth year] Today's Date: 04/17/2021   History of Present Illness Pt is a 60 y.o. 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 PMH includes HTN, CAD, DM, CKD IV, mitral valve insufficiency.    PT Comments    Patient is agreeable to PT. She performed sit to stand transfers and standing exercises without physical assistance. With initial verbal cues for technique to maintain NWB of LLE, patient demonstrated ability to maintain NWB with standing activity and transfers without difficulty. Recommend to continue PT to maximize independence and progress activity as able in preparation for home discharge.    Recommendations for follow up therapy are one component of a multi-disciplinary discharge planning process, led by the attending physician.  Recommendations may be updated based on patient status, additional functional criteria and insurance authorization.  Follow Up Recommendations  Home health PT     Assistance Recommended at Discharge Intermittent Supervision/Assistance  Equipment Recommendations  Wheelchair (measurements PT)    Recommendations for Other Services       Precautions / Restrictions Precautions Precautions: Fall Restrictions Weight Bearing Restrictions: Yes LLE Weight Bearing: Non weight bearing     Mobility  Bed Mobility Overal bed mobility: Modified Independent                  Transfers Overall transfer level: Needs assistance Equipment used: Rolling walker (2 wheels) Transfers: Sit to/from Stand Sit to Stand: Supervision           General transfer comment: verbal cues for LLE positioning to maintain NWB with functional transfers. patient able to demonstrate without difficulty    Ambulation/Gait             General Gait Details: patient declined walking  this session   Stairs             Wheelchair Mobility    Modified Rankin (Stroke Patients Only)       Balance           Standing balance support: Bilateral upper extremity supported;Reliant on assistive device for balance Standing balance-Leahy Scale: Fair Standing balance comment: no gross loss of balance. stand by assistance for safety                            Cognition Arousal/Alertness: Awake/alert Behavior During Therapy: [facility] for tasks assessed/performed Overall Cognitive Status: Within Functional Limits for tasks assessed                                 General Comments: patient agreeable to PT, able to follow commands without difficulty        Exercises Other Exercises Other Exercises: patient performed partial squat in standing x 10 reps RLE with rolling walker for UE support in standing, no loss of balance    General Comments        Pertinent Vitals/Pain Pain Assessment: No/denies pain    Home Living                          Prior Function            PT Goals (current goals can now be found in the care plan section) Acute Rehab PT Goals Patient Stated Goal: To go  home PT Goal Formulation: With patient Time For Goal Achievement: 04/27/21 Potential to Achieve Goals: Good Progress towards PT goals: Progressing toward goals    Frequency    7X/week      PT Plan Current plan remains appropriate    Co-evaluation              AM-PAC PT "6 Clicks" Mobility   Outcome Measure  Help needed turning from your back to your side while in a flat bed without using bedrails?: None Help needed moving from lying on your back to sitting on the side of a flat bed without using bedrails?: None Help needed moving to and from a bed to a chair (including a wheelchair)?: A Little Help needed standing up from a chair using your arms (e.g., wheelchair or bedside chair)?: A Little Help needed to walk in hospital  room?: A Little Help needed climbing 3-5 steps with a railing? : A Lot 6 Click Score: 19    End of Session Equipment Utilized During Treatment: Gait belt Activity Tolerance: Patient tolerated treatment well Patient left: in bed;with call bell/phone within reach;with bed alarm set (LLE elevated on pillow)   PT Visit Diagnosis: Other abnormalities of gait and mobility (R26.89);Muscle weakness (generalized) (M62.81)     Time: 7948-0165 PT Time Calculation (min) (ACUTE ONLY): 16 min  Charges:  $Therapeutic Activity: 8-22 mins                     Minna Merritts, PT, MPT   Percell Locus 04/17/2021, 10:52 AM

## 2021-04-17 NOTE — Progress Notes (Signed)
PROGRESS NOTE    Chelsea Barton  JOI:[id number] DOB: 01-Dec-1960 DOA: 04/10/2021 PCP: Burnard Hawthorne, FNP    Brief Narrative:  Chelsea Barton is a 60 year old female with past medical history significant for type 2 diabetes mellitus, CKD stage IV, essential hypertension, hyperlipidemia, LVH, mitral insufficiency, secondary hyperparathyroidism, hyperlipidemia who presented to [facility] ED on 10/25 with left foot pain and swelling.  Onset roughly 2 days ago.  Denies any trauma.   In the ED, temperature 98.4 F, HR 72, RR 18, BP 140/55, SPO2 97% on room air.  Sodium 138, potassium 4.1, chloride 102, CO2 26, glucose 213, BUN 40, creatinine 2.70.  Lactic acid 1.9.  WBC 26.1, hemoglobin 11.3, platelets 243.  INR 1.3.  COVID-19 PCR negative.  Influenza A/B PCR negative.  X-ray left foot with comminuted and displaced second proximal phalanx fracture, soft tissue emphysema involving the digits and forefoot around the fracture suggestive of necrotizing soft tissue infection.  Podiatry was consulted.  Patient was started empiric antibiotics.  TRH consulted for further evaluation and management of left foot diabetic infection.   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 X-ray left foot with comminuted and displaced second proximal phalanx fracture, soft tissue emphysema involving the digits and forefoot around the fracture suggestive of  necrotizing soft tissue infection.  Patient underwent incision and drainage and left second toe amputation by podiatry, Dr. Amalia Hailey on 04/10/2021.  MRI left foot with hyperintense signal Sica metatarsal head with adjacent skin defect concerning for vasculitis, marked soft tissue edema about amputation site, diabetic myopathy/myositis of the plantar muscles without drainable fluid collection/abscess.  Underwent transmetatarsal amputation by podiatry, Dr. Posey Pronto on 04/12/2021. --Podiatry following, appreciate assistance --WBC 26.1>19.7>19.5>16.1>16.0>12.8>13.3 --ESR 72 --CRP 27.8 --Blood cultures x2: no growth x 5 days --Operative culture 10/25: few GPCs, Mod GNRs, Mod GPCs; Prevotella disiens --Zyvox 600 mg IV q12h --Zosyn 3.$RemoveBefore'375mg'[id number]$  IV q8h --Oxycodone 5 mg q4h prn moderate pain --Morphine 2 mg IV q2h PRN severe pain --Not weightbearing left lower extremity with cam walker; plan outpatient PT and follow-up of [facility]. --Discussed with podiatry, Dr. Sherryle Lis 11/1; last night had to open up some of the wound and expressed some purulent discharge, may need further surgical intervention.  Peripheral vascular disease Ultrasound arterial ABI 10/26 with moderate to severe left lower extremity peripheral artery disease with ABI 0.53 and mild right lower extremity peripheral artery disease with ABI 0.90.  Underwent angiogram by vascular surgery on 04/13/2021 with arthrectomy left SFA and angioplasty with stent placement left superficial femoral artery. --Plavix 75 mg p.o. daily --Vascular surgery plans outpatient follow-up  Type 2 diabetes mellitus with hyperglycemia Hemoglobin A1c 7.8 and 04/10/2021, not optimally controlled.  Home regimen includes Lantus 40 units apparently daily, Farxiga 10 mg p.o. daily. --Semglee 25u [place] QHS --NovoLog SSI for further coverage --CBGs qAC/HS  Acute renal failure on CKD stage IV Creatinine elevated 2.70 on admission, baseline 2.3-2.4.  Etiology likely  secondary to  acute infectious process as above. --Cr 2.70>2.62>2.52>2.40>2.45>2.48>2.34 >2.43 --Holding home enalapril --Avoid nephrotoxins, renal dose all medications --BMP daily  Essential hypertension Home regimen includes carvedilol 12.5 mg p.o. twice daily, enalapril 40 mg p.o. daily, digoxin 0.15 mg p.o. daily, nifedipine 30 mg p.o. daily, hydralazine prn. --Carvedilol 25 mg p.o. twice daily --Digoxin 0.125 mg p.o. daily --Nifedipine to 60 mg p.o. daily --Hydralazine 50mg  PO q8h --Holding enalapril due to AKI as above.  Hyperlipidemia HDL 25, LDL 42. --continue home Crestor 10 mg p.o. daily  Obesity Body mass index is 29.95 kg/m.  Discussed with patient needs for aggressive lifestyle changes/weight loss as this complicates all facets of care.  Outpatient follow-up with PCP.     DVT prophylaxis: SCDs Start: 04/10/21 2138 Place TED hose Start: 04/10/21 1455   Code Status: Full Code Family Communication: No family present at bedside this morning.  Disposition Plan:  Level of care: Med-Surg Status is: Inpatient  Remains inpatient appropriate because: pending likely need for further debridement per podiatry, IV antibiotics    Consultants:  Podiatry, Dr. Amalia Hailey Vascular surgery, Dr. Delana Meyer  Procedures:  Incision and drainage left foot with left second toe amputation, podiatry Dr. Amalia Hailey 10/25 Left foot transmetatarsal amputation, podiatry Dr. Posey Pronto 10/27.  Antimicrobials:  Zyvox 10/25>> Zosyn 10/25>> Vancomycin 10/25 - 10/25 Clindamycin 10/25 - 10/25    Subjective: Patient seen examined bedside, resting comfortably.  Eating breakfast.  No specific complaints this morning.  Discussed with podiatry this morning, Dr. Sherryle Lis and likely will need further debridement/irrigation due to purulent drainage noted last night on exam.  Vascular surgery plans outpatient follow-up.  Remains on IV antibiotics.  Patient with no other questions or concerns at this time.  Denies  headache, no current fever/chills/night sweats, no nausea/vomiting/diarrhea, no chest pain, palpitations, no abdominal pain, no weakness, no fatigue, no paresthesias.  No acute events overnight per nursing staff.  Objective: Vitals:   04/17/21 0435 04/17/21 0637 04/17/21 0742 04/17/21 1117  BP: (!) 186/64 (!) 169/64 (!) 193/74 (!) 198/70  Pulse:  74 77 65  Resp:   16 18  Temp:   98.7 F (37.1 C) 97.7 F (36.5 C)  TempSrc:   Oral Oral  SpO2:  97% 94% 97%  Weight:      Height:        Intake/Output Summary (Last 24 hours) at 04/17/2021 1248 Last data filed at 04/17/2021 1020 Gross per 24 hour  Intake 480 ml  Output 0 ml  Net 480 ml   Filed Weights   04/10/21 1242 04/12/21 1537 04/13/21 1040  Weight: 86.2 kg 86.2 kg 81.6 kg    Examination:  General exam: Appears calm and comfortable, obese Respiratory system: Clear to auscultation. Respiratory effort normal.  On room air Cardiovascular system: S1 & S2 heard, RRR. No JVD, murmurs, rubs, gallops or clicks. No pedal edema. Gastrointestinal system: Abdomen is nondistended, soft and nontender. No organomegaly or masses felt. Normal bowel sounds heard. Central nervous system: Alert and oriented. No focal neurological deficits. Extremities: Symmetric 5 x 5 power.  Left foot with dressing/Ace wrap in place, clean/dry/intact Skin: Left foot wound with dressing/ACE Wrap in place, clean/dry/intact; otherwise no rashes, lesions or ulcers Psychiatry: Judgement and insight appear normal. Mood & affect appropriate.        Data Reviewed: I have personally reviewed following labs and imaging studies  CBC: Recent Labs  Lab 04/10/21 1357 04/11/21 0245 04/13/21 0449 04/14/21 0433 04/15/21 0427 04/16/21 0408 04/17/21 0237  WBC 26.1*   < >  16.1* 16.0* 14.3* 12.8* 13.3*  NEUTROABS 21.5*  --   --   --   --   --   --   HGB 11.3*   < > 11.3* 10.9* 10.6* 10.1* 10.3*  HCT 35.6*   < > 34.9* 35.7* 33.3* 33.1* 32.7*  MCV 87.9   < > 86.0 86.2  86.3 85.8 86.7  PLT 243   < > 274 301 284 287 295   < > = values in this interval not displayed.   Basic Metabolic Panel: Recent Labs  Lab 04/13/21 0449 04/14/21 0433 04/15/21 0427 04/16/21 0408 04/17/21 0237  NA 138 140 139 138 136  K 4.0 4.3 4.3 4.0 3.7  CL 106 107 105 107 102  CO2 _0 GLUCOSE 127* 112* 117* 143* 248*  BUN _1 CREATININE 2.40* 2.45* 2.48* 2.34* 2.43*  CALCIUM 8.0* 8.2* 8.5* 7.9* 8.3*   GFR: Estimated Creatinine Clearance: 25.6 mL/min (A) (by C-G formula based on SCr of 2.43 mg/dL (H)). Liver Function Tests: Recent Labs  Lab 04/10/21 1357 04/11/21 0245  AST 22 20  ALT 20 17  ALKPHOS 108 103  BILITOT 1.1 0.8  PROT 7.5 6.8  ALBUMIN 2.8* 2.6*   No results for input(s): LIPASE, AMYLASE in the last 168 hours. No results for input(s): AMMONIA in the last 168 hours. Coagulation Profile: Recent Labs  Lab 04/10/21 1357  INR 1.3*   Cardiac Enzymes: No results for input(s): CKTOTAL, CKMB, CKMBINDEX, TROPONINI in the last 168 hours. BNP (last 3 results) No results for input(s): PROBNP in the last 8760 hours. HbA1C: No results for input(s): HGBA1C in the last 72 hours.  CBG: Recent Labs  Lab 04/16/21 1303 04/16/21 1726 04/16/21 2126 04/17/21 0800 04/17/21 1142  GLUCAP 166* 186* 201* 205* 165*   Lipid Profile: No results for input(s): CHOL, HDL, LDLCALC, TRIG, CHOLHDL, LDLDIRECT in the last 72 hours.  Thyroid Function Tests: No results for input(s): TSH, T4TOTAL, FREET4, T3FREE, THYROIDAB in the last 72 hours. Anemia Panel: No results for input(s): VITAMINB12, FOLATE, FERRITIN, TIBC, IRON, RETICCTPCT in the last 72 hours. Sepsis Labs: Recent Labs  Lab 04/10/21 1357 04/10/21 1637  LATICACIDVEN 1.9 1.5    Recent Results (from the past 240 hour(s))  Resp Panel by RT-PCR (Flu A&B, Covid) Nasopharyngeal Swab     Status: None   Collection Time: 04/10/21  2:28 PM   Specimen: Nasopharyngeal Swab; Nasopharyngeal(NP)  swabs in vial transport medium  Result Value Ref Range Status   SARS Coronavirus 2 by RT PCR NEGATIVE NEGATIVE Final    Comment: (NOTE) SARS-CoV-2 target nucleic acids are NOT DETECTED.  The SARS-CoV-2 RNA is generally detectable in upper respiratory specimens during the acute phase of infection. The lowest concentration of SARS-CoV-2 viral copies this assay can detect is 138 copies/mL. A negative result does not preclude SARS-Cov-2 infection and should not be used as the sole basis for treatment or other patient management decisions. A negative result may occur with  improper specimen collection/handling, submission of specimen other than nasopharyngeal swab, presence of viral mutation(s) within the areas targeted by this assay, and inadequate number of viral copies(<138 copies/mL). A negative result must be combined with clinical observations, patient history, and epidemiological information. The expected result is Negative.  Fact Sheet for Patients:  EntrepreneurPulse.com.au  Fact Sheet for Healthcare Providers:  IncredibleEmployment.be  This test is no t yet approved or cleared by the [place] FDA and  has been  authorized for detection and/or diagnosis of SARS-CoV-2 by FDA under an Emergency Use Authorization (EUA). This EUA will remain  in effect (meaning this test can be used) for the duration of the COVID-19 declaration under Section 564(b)(1) of the Act, 21 U.S.C.section 360bbb-3(b)(1), unless the authorization is terminated  or revoked sooner.       Influenza A by PCR NEGATIVE NEGATIVE Final   Influenza B by PCR NEGATIVE NEGATIVE Final    Comment: (NOTE) The Xpert Xpress SARS-CoV-2/FLU/RSV plus assay is intended as an aid in the diagnosis of influenza from Nasopharyngeal swab specimens and should not be used as a sole basis for treatment. Nasal washings and aspirates are unacceptable for Xpert Xpress  SARS-CoV-2/FLU/RSV testing.  Fact Sheet for Patients: EntrepreneurPulse.com.au  Fact Sheet for Healthcare Providers: IncredibleEmployment.be  This test is not yet approved or cleared by the [place] FDA and has been authorized for detection and/or diagnosis of SARS-CoV-2 by FDA under an Emergency Use Authorization (EUA). This EUA will remain in effect (meaning this test can be used) for the duration of the COVID-19 declaration under Section 564(b)(1) of the Act, 21 U.S.C. section 360bbb-3(b)(1), unless the authorization is terminated or revoked.  Performed at [facility], [address] [place], [place] [address]   Blood culture (routine single)     Status: None   Collection Time: 04/10/21  2:37 PM   Specimen: BLOOD  Result Value Ref Range Status   Specimen Description BLOOD BLOOD LEFT FOREARM  Final   Special Requests   Final    BOTTLES DRAWN AEROBIC AND ANAEROBIC Blood Culture adequate volume   Culture   Final    NO GROWTH 5 DAYS Performed at [facility], [address], [place], [place] [address]    Report Status 04/15/2021 FINAL  Final  Culture, blood (single)     Status: None   Collection Time: 04/10/21  4:32 PM   Specimen: BLOOD  Result Value Ref Range Status   Specimen Description BLOOD BLOOD RIGHT HAND  Final   Special Requests   Final    BOTTLES DRAWN AEROBIC AND ANAEROBIC Blood Culture adequate volume   Culture   Final    NO GROWTH 5 DAYS Performed at [facility], [address], [place], [place] [address]    Report Status 04/15/2021 FINAL  Final  Aerobic/Anaerobic Culture w Gram Stain (surgical/deep wound)     Status: None   Collection Time: 04/10/21  8:34 PM   Specimen: PATH Other; Wound  Result Value Ref Range Status   Specimen Description   Final    FOOT LEFT Performed at [facility], [address], [place], [place] [address]    Special Requests   Final     NONE Performed at [facility], [place], [place] [address]    Gram Stain   Final    FEW SQUAMOUS EPITHELIAL CELLS PRESENT FEW WBC SEEN FEW GRAM POSITIVE RODS MODERATE GRAM NEGATIVE RODS MODERATE GRAM POSITIVE COCCI    Culture   Final    MODERATE PREVOTELLA DISIENS BETA LACTAMASE POSITIVE NORMAL SKIN FLORA Performed at [facility], [place] [address] [place], [place] [address]    Report Status 04/15/2021 FINAL  Final         Radiology Studies: No results found.      Scheduled Meds:  carvedilol  25 mg Oral BID WC   clopidogrel  75 mg Oral Q breakfast   dapagliflozin propanediol  10 mg  Oral Daily   digoxin  125 mcg Oral Daily   hydrALAZINE  100 mg Oral Q8H   insulin aspart  0-15 Units Subcutaneous TID WC   insulin aspart  0-5 Units Subcutaneous QHS   insulin glargine-yfgn  25 Units Subcutaneous QHS   NIFEdipine  60 mg Oral Daily   rosuvastatin  10 mg Oral QHS   sodium chloride flush  3 mL Intravenous Q12H   Continuous Infusions:  sodium chloride 250 mL (04/13/21 2256)   linezolid (ZYVOX) IV 600 mg (04/17/21 0834)   piperacillin-tazobactam (ZOSYN)  IV 3.375 g (04/17/21 0833)     LOS: 6 days    Time spent: 36 minutes spent on chart review, discussion with nursing staff, consultants, updating family and interview/physical exam; more than 50% of that time was spent in counseling and/or coordination of care.    Gaius Ishaq J [place] DO [facility] Available via Epic secure chat 7am-7pm After these hours, please refer to coverage provider listed on [email or web address] 04/17/2021, 12:48 PM

## 2021-04-17 NOTE — TOC Progression Note (Addendum)
Transition of Care [facility]) - Progression Note    Patient Details  Name: Chelsea Barton MRN: [id number] Date of Birth: May 13, 1961  Transition [facility]) CM/SW [place], RN Phone Number: 04/17/2021, 10:56 AM  Clinical Narrative:   [facility] has stated that they are not open with the patient, I checked with every [facility] agency and none will accept her, they stated they are not contracted with the managed medicaid she has, I explained it to the patient and she is agreeable to go to Outpatient PT Faxed referral to [facility] outpatient Pt and requested them to call the patient with an appointment   Expected Discharge Plan: Home/Self Care Barriers to Discharge: Continued Medical Work up  Expected Discharge Plan and Services Expected Discharge Plan: Home/Self Care   Discharge Planning Services: CM Consult                     DME Arranged: Gilford Rile rolling DME Agency: [employer] Date DME Agency Contacted: 04/13/21 Time DME Agency Contacted: 67 Representative spoke with at DME Agency: [employer] ([place]) Interventions    Readmission Risk Interventions No flowsheet data found.

## 2021-04-18 ENCOUNTER — Inpatient Hospital Stay: Payer: Medicaid Other | Admitting: Registered Nurse

## 2021-04-18 ENCOUNTER — Encounter: Payer: Self-pay | Admitting: Internal Medicine

## 2021-04-18 ENCOUNTER — Encounter
Admission: EM | Disposition: A | Discharge status: Home Health | Payer: Self-pay | Source: Home / Self Care | Attending: Internal Medicine

## 2021-04-18 ENCOUNTER — Other Ambulatory Visit: Payer: Self-pay | Admitting: Family

## 2021-04-18 DIAGNOSIS — Z89429 Acquired absence of other toe(s), unspecified side: Secondary | ICD-10-CM

## 2021-04-18 DIAGNOSIS — M6289 Other specified disorders of muscle: Secondary | ICD-10-CM | POA: Diagnosis not present

## 2021-04-18 DIAGNOSIS — L03032 Cellulitis of left toe: Secondary | ICD-10-CM

## 2021-04-18 DIAGNOSIS — E11628 Type 2 diabetes mellitus with other skin complications: Secondary | ICD-10-CM | POA: Diagnosis not present

## 2021-04-18 DIAGNOSIS — I96 Gangrene, not elsewhere classified: Secondary | ICD-10-CM

## 2021-04-18 DIAGNOSIS — L089 Local infection of the skin and subcutaneous tissue, unspecified: Secondary | ICD-10-CM

## 2021-04-18 DIAGNOSIS — I739 Peripheral vascular disease, unspecified: Secondary | ICD-10-CM

## 2021-04-18 DIAGNOSIS — M7989 Other specified soft tissue disorders: Secondary | ICD-10-CM | POA: Diagnosis not present

## 2021-04-18 DIAGNOSIS — L0390 Cellulitis, unspecified: Secondary | ICD-10-CM | POA: Diagnosis not present

## 2021-04-18 DIAGNOSIS — M86172 Other acute osteomyelitis, left ankle and foot: Secondary | ICD-10-CM

## 2021-04-18 HISTORY — PX: INCISION AND DRAINAGE: SHX5863

## 2021-04-18 LAB — CBC
HCT: 34.1 % — ABNORMAL LOW (ref 36.0–46.0)
Hemoglobin: 10.6 g/dL — ABNORMAL LOW (ref 12.0–15.0)
MCH: 26.8 pg (ref 26.0–34.0)
MCHC: 31.1 g/dL (ref 30.0–36.0)
MCV: 86.1 fL (ref 80.0–100.0)
Platelets: 301 10*3/uL (ref 150–400)
RBC: 3.96 MIL/uL (ref 3.87–5.11)
RDW: 14 % (ref 11.5–15.5)
WBC: 11.7 10*3/uL — ABNORMAL HIGH (ref 4.0–10.5)
nRBC: 0 % (ref 0.0–0.2)

## 2021-04-18 LAB — GLUCOSE, CAPILLARY
Glucose-Capillary: 155 mg/dL — ABNORMAL HIGH (ref 70–99)
Glucose-Capillary: 129 mg/dL — ABNORMAL HIGH (ref 70–99)
Glucose-Capillary: 75 mg/dL (ref 70–99)
Glucose-Capillary: 91 mg/dL (ref 70–99)
Glucose-Capillary: 138 mg/dL — ABNORMAL HIGH (ref 70–99)

## 2021-04-18 LAB — BASIC METABOLIC PANEL
Anion gap: 7 (ref 5–15)
BUN: 20 mg/dL (ref 8–23)
CO2: 25 mmol/L (ref 22–32)
Calcium: 8.3 mg/dL — ABNORMAL LOW (ref 8.9–10.3)
Chloride: 106 mmol/L (ref 98–111)
Creatinine, Ser: 2.17 mg/dL — ABNORMAL HIGH (ref 0.44–1.00)
GFR, Estimated: 25 mL/min — ABNORMAL LOW (ref >60)
Glucose, Bld: 159 mg/dL — ABNORMAL HIGH (ref 70–99)
Potassium: 3.8 mmol/L (ref 3.5–5.1)
Sodium: 138 mmol/L (ref 135–145)

## 2021-04-18 SURGERY — INCISION AND DRAINAGE
Anesthesia: General | Site: Foot | Laterality: Left

## 2021-04-18 MED ORDER — FENTANYL CITRATE (PF) 100 MCG/2ML IJ SOLN
INTRAMUSCULAR | Status: DC | PRN
  Administered 2021-04-18 (×2): 50 ug via INTRAVENOUS

## 2021-04-18 MED ORDER — VANCOMYCIN HCL 1000 MG IV SOLR
INTRAVENOUS | Status: AC
  Filled 2021-04-18: qty 20

## 2021-04-18 MED ORDER — PROPOFOL 500 MG/50ML IV EMUL
INTRAVENOUS | Status: DC | PRN
  Administered 2021-04-18: 100 ug/kg/min via INTRAVENOUS

## 2021-04-18 MED ORDER — FENTANYL CITRATE (PF) 100 MCG/2ML IJ SOLN
INTRAMUSCULAR | Status: AC
  Filled 2021-04-18: qty 2

## 2021-04-18 MED ORDER — BUPIVACAINE HCL (PF) 0.5 % IJ SOLN
INTRAMUSCULAR | Status: AC
  Filled 2021-04-18: qty 30

## 2021-04-18 MED ORDER — VANCOMYCIN HCL 1 G IV SOLR
INTRAVENOUS | Status: DC | PRN
  Administered 2021-04-18: 1000 mg

## 2021-04-18 MED ORDER — PROPOFOL 1000 MG/100ML IV EMUL
INTRAVENOUS | Status: AC
  Filled 2021-04-18: qty 100

## 2021-04-18 MED ORDER — 0.9 % SODIUM CHLORIDE (POUR BTL) OPTIME
TOPICAL | Status: DC | PRN
  Administered 2021-04-18: 3000 mL

## 2021-04-18 MED ORDER — FENTANYL CITRATE (PF) 100 MCG/2ML IJ SOLN: 25.0000 ug | INTRAMUSCULAR | Status: DC | PRN

## 2021-04-18 MED ORDER — BUPIVACAINE HCL 0.5 % IJ SOLN
INTRAMUSCULAR | Status: DC | PRN
  Administered 2021-04-18: 12 mL

## 2021-04-18 SURGICAL SUPPLY — 61 items
BLADE OSC/SAGITTAL MD 5.5X18 (BLADE) IMPLANT
BLADE OSCILLATING/SAGITTAL (BLADE)
BLADE SURG 15 STRL LF DISP TIS (BLADE) ×1 IMPLANT
BLADE SURG 15 STRL SS (BLADE) ×1
BLADE SW THK.38XMED LNG THN (BLADE) IMPLANT
BNDG COHESIVE 4X5 TAN ST LF (GAUZE/BANDAGES/DRESSINGS) ×2 IMPLANT
BNDG COHESIVE 6X5 TAN ST LF (GAUZE/BANDAGES/DRESSINGS) ×2 IMPLANT
BNDG CONFORM 2 STRL LF (GAUZE/BANDAGES/DRESSINGS) ×2 IMPLANT
BNDG CONFORM 3 STRL LF (GAUZE/BANDAGES/DRESSINGS) ×2 IMPLANT
BNDG ELASTIC 4X5.8 VLCR STR LF (GAUZE/BANDAGES/DRESSINGS) ×2 IMPLANT
BNDG ESMARK 4X12 TAN STRL LF (GAUZE/BANDAGES/DRESSINGS) ×2 IMPLANT
BNDG GAUZE ELAST 4 BULKY (GAUZE/BANDAGES/DRESSINGS) ×2 IMPLANT
CANISTER SUCT VAC XL PIRAHNA (CANNISTER) ×2 IMPLANT
CANISTER WOUND CARE 500ML ATS (WOUND CARE) ×2 IMPLANT
CUFF TOURN SGL QUICK 12 (TOURNIQUET CUFF) IMPLANT
CUFF TOURN SGL QUICK 18X4 (TOURNIQUET CUFF) IMPLANT
DRAPE FLUOR MINI C-ARM 54X84 (DRAPES) IMPLANT
DRAPE XRAY CASSETTE 23X24 (DRAPES) IMPLANT
DURAPREP 26ML APPLICATOR (WOUND CARE) ×2 IMPLANT
ELECT REM PT RETURN 9FT ADLT (ELECTROSURGICAL) ×2
ELECTRODE REM PT RTRN 9FT ADLT (ELECTROSURGICAL) ×1 IMPLANT
GAUZE 4X4 16PLY ~~LOC~~+RFID DBL (SPONGE) ×4 IMPLANT
GAUZE PACKING 1/4 X5 YD (GAUZE/BANDAGES/DRESSINGS) ×2 IMPLANT
GAUZE PACKING IODOFORM 1X5 (PACKING) ×2 IMPLANT
GAUZE SPONGE 4X4 12PLY STRL (GAUZE/BANDAGES/DRESSINGS) ×2 IMPLANT
GAUZE XEROFORM 1X8 LF (GAUZE/BANDAGES/DRESSINGS) ×2 IMPLANT
GLOVE SURG ENC MOIS LTX SZ7 (GLOVE) ×4 IMPLANT
GOWN STRL REUS W/ TWL XL LVL3 (GOWN DISPOSABLE) ×2 IMPLANT
GOWN STRL REUS W/TWL MED LVL3 (GOWN DISPOSABLE) IMPLANT
GOWN STRL REUS W/TWL XL LVL3 (GOWN DISPOSABLE) ×2
HANDPIECE VERSAJET DEBRIDEMENT (MISCELLANEOUS) IMPLANT
IV NS 1000ML (IV SOLUTION) ×1
IV NS 1000ML BAXH (IV SOLUTION) ×1 IMPLANT
KIT DRSG VAC SLVR GRANUFM (MISCELLANEOUS) ×4 IMPLANT
KIT TURNOVER KIT A (KITS) ×2 IMPLANT
LABEL OR SOLS (LABEL) ×2 IMPLANT
MANIFOLD NEPTUNE II (INSTRUMENTS) ×2 IMPLANT
NEEDLE FILTER BLUNT 18X 1/2SAF (NEEDLE) ×1
NEEDLE FILTER BLUNT 18X1 1/2 (NEEDLE) ×1 IMPLANT
NEEDLE HYPO 25X1 1.5 SAFETY (NEEDLE) ×2 IMPLANT
NS IRRIG 500ML POUR BTL (IV SOLUTION) ×2 IMPLANT
PACK EXTREMITY ARMC (MISCELLANEOUS) ×2 IMPLANT
PAD ABD DERMACEA PRESS 5X9 (GAUZE/BANDAGES/DRESSINGS) ×2 IMPLANT
PENCIL ELECTRO HAND CTR (MISCELLANEOUS) ×2 IMPLANT
PULSAVAC PLUS IRRIG FAN TIP (DISPOSABLE) ×2
RASP SM TEAR CROSS CUT (RASP) IMPLANT
SHIELD FULL FACE ANTIFOG 7M (MISCELLANEOUS) ×2 IMPLANT
STOCKINETTE IMPERVIOUS 9X36 MD (GAUZE/BANDAGES/DRESSINGS) ×2 IMPLANT
SUT ETHILON 2 0 FS 18 (SUTURE) ×6 IMPLANT
SUT ETHILON 4-0 (SUTURE) ×1
SUT ETHILON 4-0 FS2 18XMFL BLK (SUTURE) ×1
SUT VIC AB 3-0 SH 27 (SUTURE) ×1
SUT VIC AB 3-0 SH 27X BRD (SUTURE) ×1 IMPLANT
SUT VIC AB 4-0 FS2 27 (SUTURE) ×2 IMPLANT
SUTURE ETHLN 4-0 FS2 18XMF BLK (SUTURE) ×1 IMPLANT
SWAB CULTURE AMIES ANAERIB BLU (MISCELLANEOUS) IMPLANT
SYR 10ML LL (SYRINGE) ×2 IMPLANT
SYR 3ML LL SCALE MARK (SYRINGE) ×2 IMPLANT
TIP FAN IRRIG PULSAVAC PLUS (DISPOSABLE) ×1 IMPLANT
UNDERPAD 30X36 HEAVY ABSORB (UNDERPADS AND DIAPERS) ×2 IMPLANT
WATER STERILE IRR 500ML POUR (IV SOLUTION) ×2 IMPLANT

## 2021-04-18 NOTE — Anesthesia Preprocedure Evaluation (Signed)
Anesthesia Evaluation  Patient identified by MRN, date of birth, ID band Patient awake    Reviewed: Allergy & Precautions, H&P , NPO status , Patient's Chart, lab work & pertinent test results, reviewed documented beta blocker date and time   History of Anesthesia Complications Negative for: history of anesthetic complications  Airway Mallampati: III  TM Distance: >3 FB Neck ROM: full    Dental  (+) Dental Advidsory Given, Edentulous Upper, Edentulous Lower   Pulmonary neg shortness of breath, former smoker,    Pulmonary exam normal        Cardiovascular Exercise Tolerance: Good hypertension, Pt. on medications and Pt. on home beta blockers (-) angina+ CAD  (-) Past MI and (-) Cardiac Stents Normal cardiovascular exam(-) dysrhythmias + Valvular Problems/Murmurs ( mitral insufficiency)    LVH   Neuro/Psych  Neuromuscular disease negative psych ROS   GI/Hepatic negative GI ROS, Neg liver ROS,   Endo/Other  diabetes, Poorly Controlled, Insulin Dependentsecondary hyperparathyroidism  Renal/GU CRFRenal disease (CKD stage IV)  negative genitourinary   Musculoskeletal osteomyelitis   Abdominal (+) + obese,   Peds  Hematology  (+) anemia ,   Anesthesia Other Findings  Past Medical History: No date: Diabetes mellitus No date: Hyperlipidemia No date: Hypertension  BMI    Body Mass Index: 31.62 kg/m      Reproductive/Obstetrics negative OB ROS                             Anesthesia Physical  Anesthesia Plan  ASA: 3  Anesthesia Plan: General   Post-op Pain Management:    Induction: Intravenous  PONV Risk Score and Plan: 3 and TIVA and Propofol infusion  Airway Management Planned: Natural Airway and Nasal Cannula  Additional Equipment:   Intra-op Plan:   Post-operative Plan:   Informed Consent: I have reviewed the patients History and Physical, chart, labs and discussed the  procedure including the risks, benefits and alternatives for the proposed anesthesia with the patient or authorized representative who has indicated his/her understanding and acceptance.     Dental Advisory Given  Plan Discussed with: Anesthesiologist, CRNA and Surgeon  Anesthesia Plan Comments: (Patient consented for risks of anesthesia including but not limited to:  - adverse reactions to medications - risk of airway placement if required - damage to eyes, teeth, lips or other oral mucosa - nerve damage due to positioning  - sore throat or hoarseness - Damage to heart, brain, nerves, lungs, other parts of body or loss of life  Patient voiced understanding.)        Anesthesia Quick Evaluation

## 2021-04-18 NOTE — Progress Notes (Signed)
PROGRESS NOTE    Chelsea Barton  EXH:[id number] DOB: 09-21-1960 DOA: 04/10/2021 PCP: Burnard Hawthorne, FNP    Assessment & Plan:   Principal Problem:   Necrotizing cellulitis Active Problems:   Hypertension   Hyperlipidemia   Obesity   CKD (chronic kidney disease)   CAD (coronary artery disease)   Mitral valve disease   Benign essential hypertension   Long-term insulin use (HCC)   Secondary hyperparathyroidism of renal origin ([place])   Proliferative diabetic retinopathy associated with type 1 diabetes mellitus (HCC)   Osteomyelitis of foot, left, acute (HCC)   Left foot necrotizing/gas gangrene diabetic infection: w/ osteomyelitis. XR left foot with comminuted and displaced second proximal phalanx fracture, soft tissue emphysema involving the digits and forefoot around the fracture suggestive of necrotizing soft tissue infection. S/p I&D and left second toe amputation by podiatry, Dr. Amalia Hailey on 04/10/2021. S/p transmetatarsal amputation by podiatry, Dr. Posey Pronto on 04/12/2021. Blood cxs NGTD. Operative culture grew prevotella disiens. Continue on IV zosyn, zyvox. Will consult ID. Not weightbearing left lower extremity. Will need f/u w/ [place]. Will go for LLE surgery again today    PVD: ABI 10/26 with moderate to severe left lower extremity peripheral artery disease.  S/p angiogram by vascular surgery on 04/13/2021 with arthrectomy left SFA and angioplasty with stent placement left superficial femoral artery. Continue on plavix as per vascular surg. Needs outpatient f/u    DM2: poorly controlled, HbA1c 7.8. Continue glargine, SSI w/ accuchecks    AKI on CKDIV: baseline Cr 2.3-2.4. Cr is labile   Leukocytosis: likely secondary to above infection. Continue on IV abxs.   HTN: continue on carvedilol, nifedipine, digoxin, hydralazine. Continue to hold enalapril secondary to AKI   Normocytic anemia: H&H are labile    Hyperlipidemia: continue on statin    Obesity:  BMI 29.9. Would benefit from weight loss    DVT prophylaxis: SCDs Code Status: full  Family Communication:  Disposition Plan: likely d/c back home  Level of care: Med-Surg  Status is: Inpatient  Remains inpatient appropriate because: severity of illness     Consultants:    Procedures:   Antimicrobials: zosyn, linezolid    Subjective: Pt c/o malaise   Objective: Vitals:   04/17/21 2001 04/17/21 2353 04/18/21 0500 04/18/21 0753  BP: (!) 163/71 (!) 174/61 (!) 174/64 (!) 152/67  Pulse: 74  73 74  Resp: 20  20 15   Temp: 98.6 F (37 C)  98.7 F (37.1 C) 98.2 F (36.8 C)  TempSrc: Oral  Oral   SpO2: 94%  92% 96%  Weight:      Height:        Intake/Output Summary (Last 24 hours) at 04/18/2021 0827 Last data filed at 04/17/2021 1556 Gross per 24 hour  Intake 360 ml  Output 250 ml  Net 110 ml   Filed Weights   04/10/21 1242 04/12/21 1537 04/13/21 1040  Weight: 86.2 kg 86.2 kg 81.6 kg    Examination:  General exam: Appears calm and comfortable  Respiratory system: Clear to auscultation. Respiratory effort normal. Cardiovascular system: S1 & S2+. No rubs, gallops or clicks. Gastrointestinal system: Abdomen is nondistended, soft and nontender. Normal bowel sounds heard. Central nervous system: Alert and oriented. Moves all extremities  Psychiatry: Judgement and insight appear normal. Mood & affect appropriate.     Data Reviewed: I have personally reviewed following labs and imaging studies  CBC: Recent Labs  Lab 04/14/21 0433 04/15/21 0427 04/16/21 0408 04/17/21 0237 04/18/21 3810  WBC 16.0* 14.3* 12.8* 13.3* 11.7*  HGB 10.9* 10.6* 10.1* 10.3* 10.6*  HCT 35.7* 33.3* 33.1* 32.7* 34.1*  MCV 86.2 86.3 85.8 86.7 86.1  PLT 301 284 287 295 992   Basic Metabolic Panel: Recent Labs  Lab 04/14/21 0433 04/15/21 0427 04/16/21 0408 04/17/21 0237 04/18/21 0538  NA 140 139 138 136 138  K 4.3 4.3 4.0 3.7 3.8  CL 107 105 107 102 106  CO2 25 24 24 24 25    GLUCOSE 112* 117* 143* 248* 159*  BUN 19 20 22 23 20   CREATININE 2.45* 2.48* 2.34* 2.43* 2.17*  CALCIUM 8.2* 8.5* 7.9* 8.3* 8.3*   GFR: Estimated Creatinine Clearance: 28.7 mL/min (A) (by C-G formula based on SCr of 2.17 mg/dL (H)). Liver Function Tests: No results for input(s): AST, ALT, ALKPHOS, BILITOT, PROT, ALBUMIN in the last 168 hours. No results for input(s): LIPASE, AMYLASE in the last 168 hours. No results for input(s): AMMONIA in the last 168 hours. Coagulation Profile: No results for input(s): INR, PROTIME in the last 168 hours. Cardiac Enzymes: No results for input(s): CKTOTAL, CKMB, CKMBINDEX, TROPONINI in the last 168 hours. BNP (last 3 results) No results for input(s): PROBNP in the last 8760 hours. HbA1C: No results for input(s): HGBA1C in the last 72 hours. CBG: Recent Labs  Lab 04/17/21 0800 04/17/21 1142 04/17/21 1654 04/17/21 2004 04/18/21 0750  GLUCAP 205* 165* 179* 160* 155*   Lipid Profile: No results for input(s): CHOL, HDL, LDLCALC, TRIG, CHOLHDL, LDLDIRECT in the last 72 hours. Thyroid Function Tests: No results for input(s): TSH, T4TOTAL, FREET4, T3FREE, THYROIDAB in the last 72 hours. Anemia Panel: No results for input(s): VITAMINB12, FOLATE, FERRITIN, TIBC, IRON, RETICCTPCT in the last 72 hours. Sepsis Labs: No results for input(s): PROCALCITON, LATICACIDVEN in the last 168 hours.  Recent Results (from the past 240 hour(s))  Resp Panel by RT-PCR (Flu A&B, Covid) Nasopharyngeal Swab     Status: None   Collection Time: 04/10/21  2:28 PM   Specimen: Nasopharyngeal Swab; Nasopharyngeal(NP) swabs in vial transport medium  Result Value Ref Range Status   SARS Coronavirus 2 by RT PCR NEGATIVE NEGATIVE Final    Comment: (NOTE) SARS-CoV-2 target nucleic acids are NOT DETECTED.  The SARS-CoV-2 RNA is generally detectable in upper respiratory specimens during the acute phase of infection. The lowest concentration of SARS-CoV-2 viral copies this  assay can detect is 138 copies/mL. A negative result does not preclude SARS-Cov-2 infection and should not be used as the sole basis for treatment or other patient management decisions. A negative result may occur with  improper specimen collection/handling, submission of specimen other than nasopharyngeal swab, presence of viral mutation(s) within the areas targeted by this assay, and inadequate number of viral copies(<138 copies/mL). A negative result must be combined with clinical observations, patient history, and epidemiological information. The expected result is Negative.  Fact Sheet for Patients:  EntrepreneurPulse.com.au  Fact Sheet for Healthcare Providers:  IncredibleEmployment.be  This test is no t yet approved or cleared by the [place] FDA and  has been authorized for detection and/or diagnosis of SARS-CoV-2 by FDA under an Emergency Use Authorization (EUA). This EUA will remain  in effect (meaning this test can be used) for the duration of the COVID-19 declaration under Section 564(b)(1) of the Act, 21 U.S.C.section 360bbb-3(b)(1), unless the authorization is terminated  or revoked sooner.       Influenza A by PCR NEGATIVE NEGATIVE Final   Influenza B by PCR NEGATIVE NEGATIVE Final  Comment: (NOTE) The Xpert Xpress SARS-CoV-2/FLU/RSV plus assay is intended as an aid in the diagnosis of influenza from Nasopharyngeal swab specimens and should not be used as a sole basis for treatment. Nasal washings and aspirates are unacceptable for Xpert Xpress SARS-CoV-2/FLU/RSV testing.  Fact Sheet for Patients: EntrepreneurPulse.com.au  Fact Sheet for Healthcare Providers: IncredibleEmployment.be  This test is not yet approved or cleared by the [place] FDA and has been authorized for detection and/or diagnosis of SARS-CoV-2 by FDA under an Emergency Use Authorization (EUA). This EUA will  remain in effect (meaning this test can be used) for the duration of the COVID-19 declaration under Section 564(b)(1) of the Act, 21 U.S.C. section 360bbb-3(b)(1), unless the authorization is terminated or revoked.  Performed at [facility], [address] [place], [place] [address]   Blood culture (routine single)     Status: None   Collection Time: 04/10/21  2:37 PM   Specimen: BLOOD  Result Value Ref Range Status   Specimen Description BLOOD BLOOD LEFT FOREARM  Final   Special Requests   Final    BOTTLES DRAWN AEROBIC AND ANAEROBIC Blood Culture adequate volume   Culture   Final    NO GROWTH 5 DAYS Performed at [facility], [address], [place], [place] [address]    Report Status 04/15/2021 FINAL  Final  Culture, blood (single)     Status: None   Collection Time: 04/10/21  4:32 PM   Specimen: BLOOD  Result Value Ref Range Status   Specimen Description BLOOD BLOOD RIGHT HAND  Final   Special Requests   Final    BOTTLES DRAWN AEROBIC AND ANAEROBIC Blood Culture adequate volume   Culture   Final    NO GROWTH 5 DAYS Performed at [facility], [address] [place], [place] [address]    Report Status 04/15/2021 FINAL  Final  Aerobic/Anaerobic Culture w Gram Stain (surgical/deep wound)     Status: None   Collection Time: 04/10/21  8:34 PM   Specimen: PATH Other; Wound  Result Value Ref Range Status   Specimen Description   Final    FOOT LEFT Performed at [facility], [address], [place], [place] [address]    Special Requests   Final    NONE Performed at [facility], [place], [place] [address]    Gram Stain   Final    FEW SQUAMOUS EPITHELIAL CELLS PRESENT FEW WBC SEEN FEW GRAM POSITIVE RODS MODERATE GRAM NEGATIVE RODS MODERATE GRAM POSITIVE COCCI    Culture   Final    MODERATE PREVOTELLA DISIENS BETA LACTAMASE POSITIVE NORMAL SKIN FLORA Performed at [facility], [place] [address], [place], [place] [address]    Report Status 04/15/2021 FINAL  Final         Radiology Studies: No results found.      Scheduled Meds:  carvedilol  25 mg Oral BID WC   clopidogrel  75 mg Oral Q breakfast   dapagliflozin propanediol  10 mg Oral Daily   digoxin  125 mcg Oral Daily   hydrALAZINE  100 mg Oral Q8H   insulin aspart  0-15 Units Subcutaneous TID WC   insulin aspart  0-5 Units Subcutaneous QHS   insulin glargine-yfgn  25 Units Subcutaneous QHS   NIFEdipine  60 mg Oral Daily   rosuvastatin  10 mg Oral QHS   sodium chloride flush  3 mL Intravenous Q12H   Continuous Infusions:  sodium  chloride 250 mL (04/13/21 2256)   linezolid (ZYVOX) IV 600 mg (04/18/21 0826)   piperacillin-tazobactam (ZOSYN)  IV 3.375 g (04/18/21 0825)     LOS: 7 days    Time spent: 30 mins     Wyvonnia Dusky, MD [facility] Pager [phone]  If 7PM-7AM, please contact night-coverage 04/18/2021, 8:27 AM

## 2021-04-18 NOTE — Progress Notes (Signed)
History and Physical Interval Note:  04/18/2021 4:58 PM  Chelsea Barton  has presented today for surgery, with the diagnosis of left foot infection.  The various methods of treatment have been discussed with the patient and family. After consideration of risks, benefits and other options for treatment, the patient has consented to   Procedure(s): INCISION AND DRAINAGE (Left) as a surgical intervention.  The patient's history has been reviewed, patient examined, no change in status, stable for surgery.  I have reviewed the patient's chart and labs.  Questions were answered to the patient's satisfaction.     [name]

## 2021-04-18 NOTE — Progress Notes (Signed)
Occupational Therapy Treatment Patient Details Name: Chelsea Barton MRN: [id number] DOB: Mar 17, 1961 Today's Date: 04/18/2021   History of present illness Pt is a 60 y.o. 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 PMH includes HTN, CAD, DM, CKD IV, mitral valve insufficiency.   OT comments  OT arrived to pt in bed watching tv.  Initially disagreeable to OT session d/t already having been up earlier.  Pt was agreeable to transfer to [facility] d/t urge for BM, but then declined all other ADLs or functional mobility training.  Good safety with transfer, requiring only CGA for stability and good ability to maintain NWB to LLE throughout transfer.  Pt performed toilet hygiene and hand hygiene with set up after having BM.  OT assisted pt back to bed upon completion of toileting with all needs met.  OT assisted with positioning to float heels from bed.  Notified SN of pt able to have BM.     Recommendations for follow up therapy are one component of a multi-disciplinary discharge planning process, led by the attending physician.  Recommendations may be updated based on patient status, additional functional criteria and insurance authorization.    Follow Up Recommendations  Home health OT    Assistance Recommended at Discharge Intermittent Supervision/Assistance  Equipment Recommendations  [facility]    Recommendations for Other Services      Precautions / Restrictions Precautions Precautions: Fall Required Braces or Orthoses: Other Brace Other Brace: Cam boot Restrictions Weight Bearing Restrictions: Yes LLE Weight Bearing: Non weight bearing       Mobility Bed Mobility Overal bed mobility: Modified Independent             General bed mobility comments: modified indep with bed rail Patient Response: Cooperative  Transfers Overall transfer level: Needs assistance       Stand pivot  transfers: Min guard        Lateral/Scoot Transfers: Supervision General transfer comment: Good safety with SPT bed <> BSC     Balance Overall balance assessment: Needs assistance Sitting-balance support: No upper extremity supported Sitting balance-Leahy Scale: Good Sitting balance - Comments: Good sitting balance reaching outside BOS to reach for rail of BSC from bed then when reaching for bedrail while seated on BSC                                   ADL either performed or assessed with clinical judgement   ADL Overall ADL's : Needs assistance/impaired     Grooming: Wash/dry hands;Set up                   Toilet Transfer: Min Art therapist Details (indicate cue type and reason): Pt declined to use RW and preferred to pivot from half stand with BSC angled toward her; CGA only with good ability to maintain NWB to LLE Toileting- Clothing Manipulation and Hygiene: Set up;Supervision/safety Toileting - Clothing Manipulation Details (indicate cue type and reason): Performed toilet hygiene seated on BSC with slight hip tilt       General ADL Comments: Pt declined other ADLs/functional mobility today but agreeable to toilet transfer and was able to have BM using BSC.                       Cognition Arousal/Alertness: Awake/alert Behavior During Therapy: WFL for tasks  assessed/performed Overall Cognitive Status: Within Functional Limits for tasks assessed                                 General Comments: alert and oriented x4; pt initially disagreeable to OT visit, but agreeable to toileting with OT assist d/t urge for BM                      General Comments good safety awareness throughout tx session    Pertinent Vitals/ Pain       Pain Assessment: 0-10 Pain Score: 0-No pain Pain Location: Pt denies pain at rest and with activity throughout OT session today  Home Living                                                         Frequency  Min 1X/week        Progress Toward Goals  OT Goals(current goals can now be found in the care plan section)  Progress towards OT goals: Progressing toward goals  Acute Rehab OT Goals Patient Stated Goal: to go home OT Goal Formulation: With patient Time For Goal Achievement: 04/29/21 Potential to Achieve Goals: Good  Plan Discharge plan remains appropriate    Co-evaluation                 AM-PAC OT "6 Clicks" Daily Activity     Outcome Measure   Help from another person eating meals?: None Help from another person taking care of personal grooming?: None Help from another person toileting, which includes using toliet, bedpan, or urinal?: A Little Help from another person bathing (including washing, rinsing, drying)?: A Little Help from another person to put on and taking off regular upper body clothing?: None Help from another person to put on and taking off regular lower body clothing?: A Little 6 Click Score: 21    End of Session    OT Visit Diagnosis: Unsteadiness on feet (R26.81);Other abnormalities of gait and mobility (R26.89)   Activity Tolerance Patient tolerated treatment well   Patient Left in bed;with call bell/phone within reach;with bed alarm set   Nurse Communication Mobility status;Other (comment) (able to have BM on [facility])        Time: 7308-5694 OT Time Calculation (min): 12 min  Charges: OT General Charges $OT Visit: 1 Visit OT Treatments $Self Care/Home Management : 8-22 mins  Chelsea Speller, MS, OTR/L   Chelsea Barton 04/18/2021, 2:04 PM

## 2021-04-18 NOTE — Progress Notes (Signed)
Physical Therapy Treatment Patient Details Name: Chelsea Barton MRN: [id number] DOB: 03-24-1961 Today's Date: 04/18/2021   History of Present Illness Pt is a 60 y.o. 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 PMH includes HTN, CAD, DM, CKD IV, mitral valve insufficiency.    PT Comments    Pt was asleep in supine upon arriving. She easily awakes and agrees to PT session. Extremely pleasant and motivated. Is NPO for procedure later this date. She continues to be NWB LLE. Easily able to exit bed, stand to RW and "hop" around room without LOB or safety concern. Author reviewed importance of continued there ex throughout the day to promote strengthening and increased circulation. CM having difficulty with HH placement however pt does demonstrate safe enough abilities to DC directly into outpatient PT program. Supportive sister will be able to assist her with getting to appointments. Acute PT will continue to follow per current POC.    Recommendations for follow up therapy are one component of a multi-disciplinary discharge planning process, led by the attending physician.  Recommendations may be updated based on patient status, additional functional criteria and insurance authorization.  Follow Up Recommendations  Other (comment) (HHPT best however pt may have to go directly to outpatient. Has a supportive sister able to assist with going to appointments)     Assistance Recommended at Discharge Intermittent Supervision/Assistance  Equipment Recommendations  Wheelchair (measurements PT)       Precautions / Restrictions Precautions Precautions: Fall Required Braces or Orthoses: Other Brace Other Brace: Cam boot Restrictions Weight Bearing Restrictions: Yes LLE Weight Bearing: Non weight bearing     Mobility  Bed Mobility Overal bed mobility: Modified Independent     Transfers Overall  transfer level: Modified independent Equipment used: Rolling walker (2 wheels) Transfers: Sit to/from Stand Sit to Stand: Supervision (lowest  bed height)           General transfer comment: Pt demonstrated safe ability to STS from EOB and from recliner    Ambulation/Gait Ambulation/Gait assistance: Supervision Gait Distance (Feet): 10 Feet Assistive device: Rolling walker (2 wheels) Gait Pattern/deviations:  (" Hop to" NWB) Gait velocity: decreased   General Gait Details: Pt was able to safely "hop" while maintaining NWB LLE without difficulty. Easily could ambulate > distances however pt is scheduled to go for I and D this date.     Balance Overall balance assessment: Needs assistance Sitting-balance support: No upper extremity supported Sitting balance-Leahy Scale: Good Sitting balance - Comments: Good sitting balance reaching outside BOS to don/doff sock   Standing balance support: Bilateral upper extremity supported;Reliant on assistive device for balance Standing balance-Leahy Scale: Good Standing balance comment: no gross loss of balance. stand by assistance for safety      Cognition Arousal/Alertness: Awake/alert Behavior During Therapy: WFL for tasks assessed/performed Overall Cognitive Status: Within Functional Limits for tasks assessed        General Comments: alert and oriented x4, pleasant and agreeable               Pertinent Vitals/Pain Pain Assessment: No/denies pain Pain Score: 0-No pain     PT Goals (current goals can now be found in the care plan section) Acute Rehab PT Goals Patient Stated Goal: To go home Progress towards PT goals: Progressing toward goals    Frequency    7X/week      PT Plan Current plan remains appropriate  AM-PAC PT "6 Clicks" Mobility   Outcome Measure  Help needed turning from your back to your side while in a flat bed without using bedrails?: None Help needed moving from lying on your back to  sitting on the side of a flat bed without using bedrails?: None Help needed moving to and from a bed to a chair (including a wheelchair)?: A Little Help needed standing up from a chair using your arms (e.g., wheelchair or bedside chair)?: A Little Help needed to walk in hospital room?: A Little Help needed climbing 3-5 steps with a railing? : A Lot 6 Click Score: 19    End of Session   Activity Tolerance: Patient tolerated treatment well Patient left: in chair;with call bell/phone within reach;with chair alarm set;with nursing/sitter in room (RN tech in room) Nurse Communication: Mobility status PT Visit Diagnosis: Other abnormalities of gait and mobility (R26.89);Muscle weakness (generalized) (M62.81)     Time: 9201-0071 PT Time Calculation (min) (ACUTE ONLY): 23 min  Charges:  $Therapeutic Exercise: 8-22 mins $Therapeutic Activity: 8-22 mins                     Julaine Fusi PTA 04/18/21, 8:02 AM

## 2021-04-18 NOTE — Op Note (Signed)
Patient Name: Chelsea Barton DOB: 1960/08/07  MRN: [id number]   Date of Service: 04/10/2021 - 04/18/2021  Surgeon: Dr. Lanae Crumbly, DPM Assistants: None Pre-operative Diagnosis:  Left foot infection Post-operative Diagnosis:   left foot myonecrosis  Procedures:  1) debridement skin subcutaneous tissue and muscle 13.5 cm x 7.5 cm x 4 cm in depth of amputation site necrosis  2) application negative pressure wound therapy Pathology/Specimens: * No specimens in log * Anesthesia: MAC and local ankle block Hemostasis: No tourniquet was used, cautery was used overall perfusion was adequate Estimated Blood Loss: 20 mL Materials: * No implants in log * Medications: 12 cc Marcaine half percent plain Complications: No complications  Indications for Procedure:  This is a 60 y.o. female with a history of prior TMA 1 week ago peripheral arterial disease and diabetes with worsening infection of her TMA site   Procedure in Detail: Patient was identified in pre-operative holding area. Formal consent was signed and the left lower extremity was marked. Patient was brought back to the operating room. Anesthesia was induced. The extremity was prepped and draped in the usual sterile fashion. Timeout was taken to confirm patient name, laterality, and procedure prior to incision.   Attention was then directed to the left foot where the plantar flap exhibited significant amounts of necrosis at the skin level.  An incision was made along the medial two thirds of the flap with excisional debridement of the skin subcutaneous tissue and muscle and tendon underlying it until no further nonviable tissue and necrosis was noted.  There was significant myonecrosis in the plantar portions of the flap.  There was poor perfusion was here in the areas of necrosis but after excisional debridement the perfusion seemed adequate.  The dorsal flap had only a small amount of necrosis.  This was also excisionally debrided.  Once  all nonviable tissues have been debrided and excised I irrigated thoroughly with normal sterile saline with a pulse irrigator.  Hemostasis was achieved.  1 g of vancomycin powder was then instilled into the wound.  I made the decision to apply a negative pressure wound therapy device until we can discuss with her a more definitive amputation versus soft tissue reconstructive plan.  The foot was then dressed with the wound VAC dressings and an Ace wrap. Patient tolerated the procedure well.   Disposition: Following a period of post-operative monitoring, patient will be transferred to the floor.  This will be a staged procedure with return to the operating room at a later date.

## 2021-04-18 NOTE — Transfer of Care (Signed)
Immediate Anesthesia Transfer of Care Note  Patient: Chelsea Barton  Procedure(s) Performed: INCISION AND DRAINAGE (Left)  Patient Location: PACU  Anesthesia Type:General  Level of Consciousness: drowsy  Airway & Oxygen Therapy: Patient Spontanous Breathing and Patient connected to face mask oxygen  Post-op Assessment: Report given to RN  Post vital signs: stable  Last Vitals:  Vitals Value Taken Time  BP    Temp    Pulse    Resp    SpO2      Last Pain:  Vitals:   04/18/21 1147  TempSrc: Oral  PainSc:          Complications: No notable events documented.

## 2021-04-18 NOTE — Brief Op Note (Addendum)
04/18/2021  6:08 PM  PATIENT:  Chelsea Barton  60 y.o. female  PRE-OPERATIVE DIAGNOSIS:  abscess left foot  POST-OPERATIVE DIAGNOSIS:  myonecrosis left foot  PROCEDURE:  Procedure(s): INCISION AND DRAINAGE (Left) Application NPWT 16.1WR x 7.5cm  SURGEON:  Surgeon(s) and Role:    * Jerman Tinnon R, DPM - Primary    ANESTHESIA:   local and MAC  EBL:  20 mL   BLOOD ADMINISTERED:none  DRAINS:  wound VAC    LOCAL MEDICATIONS USED:  MARCAINE    and Amount: 12 ml  SPECIMEN:  No Specimen  DISPOSITION OF SPECIMEN:  N/A  COUNTS:  YES  TOURNIQUET:  * Missing tourniquet times found for documented tourniquets in log: [id number] *  DICTATION: .Note written in EPIC  PLAN OF CARE: Admit to inpatient   PATIENT DISPOSITION:  PACU - hemodynamically stable.   Delay start of Pharmacological VTE agent (>24hrs) due to surgical blood loss or risk of bleeding: yes

## 2021-04-18 NOTE — Progress Notes (Signed)
close

## 2021-04-18 NOTE — Consult Note (Signed)
NAMEDanetta Prom Barton  DOB: 02/09/1961  MRN: [id number]  Date/Time: 04/18/2021 2:10 PM  REQUESTING PROVIDER: Dr.Willams Subjective:  REASON FOR CONSULT: left foot necrotizing infection ? Chelsea Barton is a 60 y.o. female with a history of DM on insulin, HTN, HLD presented to the ED on 04/10/21 with a 2 day history of blister on her left foot with swelling. Pt not sure how she got it and how long it was present- she thinks she could have stubbed her toe a few weeks ago. In the ED BP 121/54, temp 98.4, sats 94% WBC 26, Hb 11.3, PLT 243, cr 2.70 She was diagnosed with gangrenous infection of the left foot andafter blood cultures she was started on zosyn and linezolid and clindamycin. Xray foot revealed Comminuted and displaced second proximal phalanx fracture.2. Soft tissue emphysema involving the digits and forefoot around the fracture, history suggesting necrotizing soft tissue infection.She was seen by podiatrist and taken for I/D and second toe amputation on 04/10/21. On 04/12/21 she underwent TMA. On 04/13/21 she had angio and Crosser atherectomy of the left SFA ,   Percutaneous transluminal angioplasty and stent placement left superficial femoral artery. I am asked to see the patient for management of the foot infection There is a pan to take her back to surgery today for further debridement  Past Medical History:  Diagnosis Date   Diabetes mellitus    Hyperlipidemia    Hypertension     Past Surgical History:  Procedure Laterality Date   AMPUTATION TOE  04/10/2021   Procedure: AMPUTATION 2nd TOE, left foot;  Surgeon: Edrick Kins, DPM;  Location: [facility];  Service: Podiatry;;   CATARACT EXTRACTION     eye surgery r Right    lense placed   INCISION AND DRAINAGE OF WOUND Left 04/10/2021   Procedure: IRRIGATION AND DEBRIDEMENT WOUND;  Surgeon: Edrick Kins, DPM;  Location: [facility];  Service: Podiatry;  Laterality: Left;   PERIPHERAL VASCULAR BALLOON ANGIOPLASTY Left 04/13/2021    Procedure: PERIPHERAL VASCULAR BALLOON ANGIOPLASTY;  Surgeon: Katha Cabal, MD;  Location: [facility];  Service: Cardiovascular;  Laterality: Left;   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   Procedure: TRANSMETATARSAL AMPUTATION;  Surgeon: Felipa Furnace, DPM;  Location: [facility];  Service: Podiatry;  Laterality: Left;    Social History   Socioeconomic History   Marital status: Single    Spouse name: Not on file   Number of children: Not on file   Years of education: Not on file   Highest education level: Not on file  Occupational History   Not on file  Tobacco Use   Smoking status: Former    Types: Cigarettes    Quit date: 06/18/2005    Years since quitting: 15.8   Smokeless tobacco: Never  Vaping Use   Vaping Use: Never used  Substance and Sexual Activity   Alcohol use: No   Drug use: No   Sexual activity: Not on file  Other Topics Concern   Not on file  Social History Narrative   Has a great niece that she keeps a lot, 52 yo, '[name]'.  Stays weekends with her.       Has 8 great nieces, 3 nieces, 6 nephews   Social Determinants of Radio broadcast assistant Strain: Medium Risk   Difficulty of Paying Living Expenses: Somewhat hard  Food Insecurity: Not on file  Transportation Needs: Not on file  Physical Activity: Not on file  Stress: Not on  file  Social Connections: Not on file  Intimate Partner Violence: Not on file    Family History  Problem Relation Age of Onset   Diabetes Sister        3/5 sisters have DM   Breast cancer Neg Hx    Colon cancer Neg Hx    No Known Allergies I? Current Facility-Administered Medications  Medication Dose Route Frequency Provider Last Rate Last Admin   0.9 %  sodium chloride infusion  250 mL Intravenous PRN Schnier, Dolores Lory, MD 10 mL/hr at 04/13/21 2256 250 mL at 04/13/21 2256   acetaminophen (TYLENOL) tablet 650 mg  650 mg Oral Q6H PRN Katha Cabal, MD   650 mg at 04/13/21 6144   Or   acetaminophen  (TYLENOL) suppository 650 mg  650 mg Rectal Q6H PRN Schnier, Dolores Lory, MD       carvedilol (COREG) tablet 25 mg  25 mg Oral BID WC [place] Eric J, DO   25 mg at 04/18/21 0820   clopidogrel (PLAVIX) tablet 75 mg  75 mg Oral Q breakfast Schnier, Dolores Lory, MD   75 mg at 04/18/21 0820   dapagliflozin propanediol (FARXIGA) tablet 10 mg  10 mg Oral Daily Schnier, Dolores Lory, MD   10 mg at 04/18/21 0820   digoxin (LANOXIN) tablet 125 mcg  125 mcg Oral Daily Katha Cabal, MD   125 mcg at 04/18/21 0820   hydrALAZINE (APRESOLINE) tablet 100 mg  100 mg Oral Q8H [place] Donnamarie Poag, DO   100 mg at 04/18/21 3154   hydrALAZINE (APRESOLINE) tablet 25 mg  25 mg Oral Q6H PRN [place] Eric J, DO   25 mg at 04/17/21 1223   insulin aspart (novoLOG) injection 0-15 Units  0-15 Units Subcutaneous TID WC [place] Eric J, DO   2 Units at 04/18/21 1212   insulin aspart (novoLOG) injection 0-5 Units  0-5 Units Subcutaneous QHS Katha Cabal, MD   2 Units at 04/16/21 2147   insulin glargine-yfgn [facility]) injection 25 Units  25 Units Subcutaneous QHS [place] Eric J, DO   25 Units at 04/17/21 2149   linezolid (ZYVOX) IVPB 600 mg  600 mg Intravenous Q12H Katha Cabal, MD 300 mL/hr at 04/18/21 0826 600 mg at 04/18/21 0826   morphine 2 MG/ML injection 2 mg  2 mg Intravenous Q2H PRN Felipa Furnace, DPM       morphine 4 MG/ML injection 2 mg  2 mg Intravenous Q1H PRN Schnier, Dolores Lory, MD       NIFEdipine (ADALAT CC) 24 hr tablet 60 mg  60 mg Oral Daily [place] Donnamarie Poag, DO   60 mg at 04/18/21 0086   ondansetron (ZOFRAN) tablet 4 mg  4 mg Oral Q6H PRN Schnier, Dolores Lory, MD       Or   ondansetron [facility]) injection 4 mg  4 mg Intravenous Q6H PRN Schnier, Dolores Lory, MD   4 mg at 04/14/21 1251   oxyCODONE (Oxy IR/ROXICODONE) immediate release tablet 5 mg  5 mg Oral Q4H PRN Felipa Furnace, DPM       piperacillin-tazobactam (ZOSYN) IVPB 3.375 g  3.375 g Intravenous Q8H [place] Donnamarie Poag, DO 12.5 mL/hr at 04/18/21 0825  3.375 g at 04/18/21 0825   rosuvastatin (CRESTOR) tablet 10 mg  10 mg Oral QHS Schnier, Dolores Lory, MD   10 mg at 04/17/21 2149   sodium chloride flush (NS) 0.9 % injection 3 mL  3 mL Intravenous Q12H Schnier, Belenda Cruise  G, MD   3 mL at 04/18/21 0827   sodium chloride flush (NS) 0.9 % injection 3 mL  3 mL Intravenous PRN Schnier, Dolores Lory, MD         Abtx:  Anti-infectives (From admission, onward)    Start     Dose/Rate Route Frequency Ordered Stop   04/13/21 1900  piperacillin-tazobactam (ZOSYN) IVPB 3.375 g        3.375 g 12.5 mL/hr over 240 Minutes Intravenous Every 8 hours 04/13/21 1549     04/13/21 1250  ceFAZolin (ANCEF) IVPB 1 g/50 mL premix  Status:  Discontinued        over 30 Minutes  Continuous PRN 04/13/21 1250 04/13/21 1433   04/13/21 1000  ceFAZolin (ANCEF) IVPB 1 g/50 mL premix       Note to Pharmacy: Send with pt to OR   1 g 100 mL/hr over 30 Minutes Intravenous On call 04/13/21 0738 04/14/21 1000   04/12/21 1535  piperacillin-tazobactam (ZOSYN) 3.375 GM/50ML IVPB       Note to Pharmacy: Register, Karen   : cabinet override      04/12/21 1535 04/12/21 1814   04/10/21 2200  clindamycin (CLEOCIN) IVPB 600 mg  Status:  Discontinued        600 mg 100 mL/hr over 30 Minutes Intravenous Every 8 hours 04/10/21 1457 04/10/21 1541   04/10/21 2200  piperacillin-tazobactam (ZOSYN) IVPB 3.375 g  Status:  Discontinued        3.375 g 12.5 mL/hr over 240 Minutes Intravenous Every 8 hours 04/10/21 1541 04/13/21 1549   04/10/21 1600  linezolid (ZYVOX) IVPB 600 mg        600 mg 300 mL/hr over 60 Minutes Intravenous Every 12 hours 04/10/21 1541     04/10/21 1500  clindamycin (CLEOCIN) IVPB 900 mg  Status:  Discontinued        900 mg 100 mL/hr over 30 Minutes Intravenous  Once 04/10/21 1411 04/10/21 1541   04/10/21 1430  vancomycin (VANCOREADY) IVPB 2000 mg/400 mL  Status:  Discontinued        2,000 mg 200 mL/hr over 120 Minutes Intravenous STAT 04/10/21 1402 04/10/21 1541   04/10/21  1415  piperacillin-tazobactam (ZOSYN) IVPB 3.375 g        3.375 g 100 mL/hr over 30 Minutes Intravenous  Once 04/10/21 1402 04/10/21 1749       REVIEW OF SYSTEMS:  Const: negative fever, negative chills, negative weight loss Eyes: negative diplopia or visual changes, negative eye pain ENT: negative coryza, negative sore throat Resp: negative cough, hemoptysis, dyspnea Cards: negative for chest pain, palpitations, lower extremity edema GU: negative for frequency, dysuria and hematuria GI: Negative for abdominal pain, diarrhea, bleeding, constipation Skin: negative for rash and pruritus Heme: negative for easy bruising and gum/nose bleeding MS: negative for myalgias, arthralgias, back pain and muscle weakness Neurolo:neuropathy feet Psych: negative for feelings of anxiety, depression  Endocrine:  diabetes Allergy/Immunology- negative for any medication or food allergies ?  Objective:  VITALS:  BP 140/65 (BP Location: Left Arm)   Pulse 67   Temp 98.3 F (36.8 C)   Resp 16   Ht 5\' 5"  (1.651 m)   Wt 81.6 kg   SpO2 95%   BMI 29.95 kg/m  PHYSICAL EXAM:  General: Alert, cooperative, no distress, appears stated age.  Head: Normocephalic, without obvious abnormality, atraumatic. Eyes: Conjunctivae clear, anicteric sclerae. Pupils are equal ENT Nares normal. No drainage or sinus tenderness. Lips, mucosa, and tongue normal. No  Thrush Edentulous  Neck: Supple, symmetrical, no adenopathy, thyroid: non tender no carotid bruit and no JVD. Back: No CVA tenderness. Lungs: Clear to auscultation bilaterally. No Wheezing or Rhonchi. No rales. Heart: Regular rate and rhythm, no murmur, rub or gallop. Abdomen: Soft, non-tender,not distended. Bowel sounds normal. No masses Extremities: left foot- TMA site- darkening-of skin around- there is an area of dehiscence ( due to suture removal) which is necrotic       Skin: No rashes or lesions. Or bruising Lymph: Cervical, supraclavicular  normal. Neurologic: Grossly non-focal Pertinent Labs Lab Results CBC    Component Value Date/Time   WBC 11.7 (H) 04/18/2021 0538   RBC 3.96 04/18/2021 0538   HGB 10.6 (L) 04/18/2021 0538   HCT 34.1 (L) 04/18/2021 0538   PLT 301 04/18/2021 0538   MCV 86.1 04/18/2021 0538   MCH 26.8 04/18/2021 0538   MCHC 31.1 04/18/2021 0538   RDW 14.0 04/18/2021 0538   LYMPHSABS 1.1 04/10/2021 1357   MONOABS 2.3 (H) 04/10/2021 1357   EOSABS 0.1 04/10/2021 1357   BASOSABS 0.1 04/10/2021 1357    CMP Latest Ref Rng & Units 04/18/2021 04/17/2021 04/16/2021  Glucose 70 - 99 mg/dL 159(H) 248(H) 143(H)  BUN 8 - 23 mg/dL 20 23 22   Creatinine 0.44 - 1.00 mg/dL 2.17(H) 2.43(H) 2.34(H)  Sodium 135 - 145 mmol/L 138 136 138  Potassium 3.5 - 5.1 mmol/L 3.8 3.7 4.0  Chloride 98 - 111 mmol/L 106 102 107  CO2 22 - 32 mmol/L 25 24 24   Calcium 8.9 - 10.3 mg/dL 8.3(L) 8.3(L) 7.9(L)  Total Protein 6.5 - 8.1 g/dL - - -  Total Bilirubin 0.3 - 1.2 mg/dL - - -  Alkaline Phos 38 - 126 U/L - - -  AST 15 - 41 U/L - - -  ALT 0 - 44 U/L - - -      Microbiology: Recent Results (from the past 240 hour(s))  Resp Panel by RT-PCR (Flu A&B, Covid) Nasopharyngeal Swab     Status: None   Collection Time: 04/10/21  2:28 PM   Specimen: Nasopharyngeal Swab; Nasopharyngeal(NP) swabs in vial transport medium  Result Value Ref Range Status   SARS Coronavirus 2 by RT PCR NEGATIVE NEGATIVE Final    Comment: (NOTE) SARS-CoV-2 target nucleic acids are NOT DETECTED.  The SARS-CoV-2 RNA is generally detectable in upper respiratory specimens during the acute phase of infection. The lowest concentration of SARS-CoV-2 viral copies this assay can detect is 138 copies/mL. A negative result does not preclude SARS-Cov-2 infection and should not be used as the sole basis for treatment or other patient management decisions. A negative result may occur with  improper specimen collection/handling, submission of specimen other than  nasopharyngeal swab, presence of viral mutation(s) within the areas targeted by this assay, and inadequate number of viral copies(<138 copies/mL). A negative result must be combined with clinical observations, patient history, and epidemiological information. The expected result is Negative.  Fact Sheet for Patients:  EntrepreneurPulse.com.au  Fact Sheet for Healthcare Providers:  IncredibleEmployment.be  This test is no t yet approved or cleared by the [place] FDA and  has been authorized for detection and/or diagnosis of SARS-CoV-2 by FDA under an Emergency Use Authorization (EUA). This EUA will remain  in effect (meaning this test can be used) for the duration of the COVID-19 declaration under Section 564(b)(1) of the Act, 21 U.S.C.section 360bbb-3(b)(1), unless the authorization is terminated  or revoked sooner.       Influenza  A by PCR NEGATIVE NEGATIVE Final   Influenza B by PCR NEGATIVE NEGATIVE Final    Comment: (NOTE) The Xpert Xpress SARS-CoV-2/FLU/RSV plus assay is intended as an aid in the diagnosis of influenza from Nasopharyngeal swab specimens and should not be used as a sole basis for treatment. Nasal washings and aspirates are unacceptable for Xpert Xpress SARS-CoV-2/FLU/RSV testing.  Fact Sheet for Patients: EntrepreneurPulse.com.au  Fact Sheet for Healthcare Providers: IncredibleEmployment.be  This test is not yet approved or cleared by the [place] FDA and has been authorized for detection and/or diagnosis of SARS-CoV-2 by FDA under an Emergency Use Authorization (EUA). This EUA will remain in effect (meaning this test can be used) for the duration of the COVID-19 declaration under Section 564(b)(1) of the Act, 21 U.S.C. section 360bbb-3(b)(1), unless the authorization is terminated or revoked.  Performed at [facility], [address] [place], [place]  [address]   Blood culture (routine single)     Status: None   Collection Time: 04/10/21  2:37 PM   Specimen: BLOOD  Result Value Ref Range Status   Specimen Description BLOOD BLOOD LEFT FOREARM  Final   Special Requests   Final    BOTTLES DRAWN AEROBIC AND ANAEROBIC Blood Culture adequate volume   Culture   Final    NO GROWTH 5 DAYS Performed at [facility], [address] [place], [place] [address]    Report Status 04/15/2021 FINAL  Final  Culture, blood (single)     Status: None   Collection Time: 04/10/21  4:32 PM   Specimen: BLOOD  Result Value Ref Range Status   Specimen Description BLOOD BLOOD RIGHT HAND  Final   Special Requests   Final    BOTTLES DRAWN AEROBIC AND ANAEROBIC Blood Culture adequate volume   Culture   Final    NO GROWTH 5 DAYS Performed at [facility], [address], [place], [place] [address]    Report Status 04/15/2021 FINAL  Final  Aerobic/Anaerobic Culture w Gram Stain (surgical/deep wound)     Status: None   Collection Time: 04/10/21  8:34 PM   Specimen: PATH Other; Wound  Result Value Ref Range Status   Specimen Description   Final    FOOT LEFT Performed at [facility], [address], [place], [place] [address]    Special Requests   Final    NONE Performed at [facility], [place], [place] [address]    Gram Stain   Final    FEW SQUAMOUS EPITHELIAL CELLS PRESENT FEW WBC SEEN FEW GRAM POSITIVE RODS MODERATE GRAM NEGATIVE RODS MODERATE GRAM POSITIVE COCCI    Culture   Final    MODERATE PREVOTELLA DISIENS BETA LACTAMASE POSITIVE NORMAL SKIN FLORA Performed at [facility], [place] [address] [place], [place] [address]    Report Status 04/15/2021 FINAL  Final    IMAGING RESULTS: MRI foot Status post amputation of the second digit through the metatarsophalangeal joint. Hyperintense signal in second metatarsal head, which in the presence of adjacent skin defect is concerning for  osteomyelitis. Differential includes reactive marrow edema secondary to postsurgical changes I have personally reviewed the films ? Impression/Recommendation ? ?Diabetic foot infection with necrotizing gangrenous infection left foot- s/p 2nd toe amputation followed by TMA Complicated by PAD and neuropathy On zosyn and linezolid- the latter can be discontinued Asked lab to ID all the bacteria in the plate- only prevotella was identified  PAD- crosser atherectomy and stent  placement in left SFA on 04/13/21  ?DM- management as per primary team  Anemia  CKD   ___________________________________________________ Discussed with patient, and the pharmacist Note:  This document was prepared using Dragon voice recognition software and may include unintentional dictation errors.

## 2021-04-19 ENCOUNTER — Encounter: Payer: Self-pay | Admitting: Podiatry

## 2021-04-19 DIAGNOSIS — L089 Local infection of the skin and subcutaneous tissue, unspecified: Secondary | ICD-10-CM | POA: Diagnosis not present

## 2021-04-19 DIAGNOSIS — E11628 Type 2 diabetes mellitus with other skin complications: Secondary | ICD-10-CM | POA: Diagnosis not present

## 2021-04-19 LAB — CBC
HCT: 33 % — ABNORMAL LOW (ref 36.0–46.0)
Hemoglobin: 10 g/dL — ABNORMAL LOW (ref 12.0–15.0)
MCH: 26.2 pg (ref 26.0–34.0)
MCHC: 30.3 g/dL (ref 30.0–36.0)
MCV: 86.4 fL (ref 80.0–100.0)
Platelets: 330 10*3/uL (ref 150–400)
RBC: 3.82 MIL/uL — ABNORMAL LOW (ref 3.87–5.11)
RDW: 14.2 % (ref 11.5–15.5)
WBC: 13.3 10*3/uL — ABNORMAL HIGH (ref 4.0–10.5)
nRBC: 0 % (ref 0.0–0.2)

## 2021-04-19 LAB — BASIC METABOLIC PANEL
Anion gap: 8 (ref 5–15)
BUN: 22 mg/dL (ref 8–23)
CO2: 25 mmol/L (ref 22–32)
Calcium: 8.3 mg/dL — ABNORMAL LOW (ref 8.9–10.3)
Chloride: 106 mmol/L (ref 98–111)
Creatinine, Ser: 2.19 mg/dL — ABNORMAL HIGH (ref 0.44–1.00)
GFR, Estimated: 25 mL/min — ABNORMAL LOW (ref >60)
Glucose, Bld: 158 mg/dL — ABNORMAL HIGH (ref 70–99)
Potassium: 3.9 mmol/L (ref 3.5–5.1)
Sodium: 139 mmol/L (ref 135–145)

## 2021-04-19 LAB — GLUCOSE, CAPILLARY
Glucose-Capillary: 81 mg/dL (ref 70–99)
Glucose-Capillary: 142 mg/dL — ABNORMAL HIGH (ref 70–99)
Glucose-Capillary: 191 mg/dL — ABNORMAL HIGH (ref 70–99)
Glucose-Capillary: 194 mg/dL — ABNORMAL HIGH (ref 70–99)
Glucose-Capillary: 219 mg/dL — ABNORMAL HIGH (ref 70–99)

## 2021-04-19 MED ORDER — SODIUM CHLORIDE 0.9 % IV SOLN
3.0000 g | Freq: Two times a day (BID) | INTRAVENOUS | Status: DC
  Administered 2021-04-19 – 2021-04-20 (×2): 3 g via INTRAVENOUS
  Filled 2021-04-19: qty 3
  Filled 2021-04-19 (×3): qty 8

## 2021-04-19 NOTE — Progress Notes (Signed)
Pharmacy Antibiotic Note  Chelsea Barton is a 60 y.o. female admitted on 04/10/2021 with DFI. Pharmacy has been consulted for Unasyn dosing.  Presented with 2-day history of blister on left foot with swelling. PMH includes DM on insulin, HLD, PAD, neuropathy. Found to have gangrenous infection of left foot.   Xray of foot and history suggesting necrotizing soft tissue infection. On 10/25, received second toe amputation, then transmetatarsal amputation on 10/27. On 10/28 underwent angio and crosser atherectomy and stent placement. Patient reportedly has significant PAD, as poor perfusion noted in the areas of necrosis per podiatry.  As necrosis seems PAD related, will transition to Unasyn to cover organisms obtained in surgical culture.   Renal function significantly above baseline with CrCl < 30.   Plan: Unasyn 3 grams every 12 hours.  Monitor renal function, LOT and clinical course.   Height: 5\' 5"  (165.1 cm) Weight: 81.6 kg (180 lb) IBW/kg (Calculated) : 57  Temp (24hrs), Avg:97.7 F (36.5 C), Min:97.3 F (36.3 C), Max:98.1 F (36.7 C)  Recent Labs  Lab 04/15/21 0427 04/16/21 0408 04/17/21 0237 04/18/21 0538 04/19/21 0700  WBC 14.3* 12.8* 13.3* 11.7* 13.3*  CREATININE 2.48* 2.34* 2.43* 2.17* 2.19*    Estimated Creatinine Clearance: 28.4 mL/min (A) (by C-G formula based on SCr of 2.19 mg/dL (H)).    No Known Allergies  Antimicrobials this admission: 10/25 Zosyn >> 11/3 10/25 Linezolid >> 11/3 11/3 Unasyn >>   Dose adjustments this admission: None  Microbiology results: 10/25 surgical cx: moderate Prevotella disiens (beta lactamse+), moderate Strep anginosis, moderate diphtheroids (Corynebacterium species) 10/25 BCx: NG 11/2 superficial cx: no squamous epithelial cells seen, ng < 24 hours   Thank you for allowing pharmacy to be a part of this patient's care.   Wynelle Cleveland, PharmD Pharmacy Resident  04/19/2021 4:29 PM

## 2021-04-19 NOTE — Progress Notes (Signed)
Physical Therapy Treatment Patient Details Name: Chelsea Barton MRN: [id number] DOB: 1961/03/19 Today's Date: 04/19/2021   History of Present Illness Pt is a 60 y.o. 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 PMH includes HTN, CAD, DM, CKD IV, mitral valve insufficiency.    PT Comments    Pt was long sitting in bed upon arriving. She is A and O x 4 and cooperative and motivated. " I feel good and hopefully can go home soon." Discussed importance of allowing L foot proper healing and continued recovery. Author clarified CAM boot orders and confirmed pt continues to be NWB LLE. Does not need to wear CAM boot currently for OOB activity. She was easily able to exit R side of bed, stand, and ambulate ~ 12 ft with RW without LOB or safety concern. Pt has received personal WC already in room for longer distances at DC. PT recommends HHPT at DC to continue to progress strength and safety with ADLs.    Recommendations for follow up therapy are one component of a multi-disciplinary discharge planning process, led by the attending physician.  Recommendations may be updated based on patient status, additional functional criteria and insurance authorization.  Follow Up Recommendations  Home health PT     Assistance Recommended at Discharge PRN  Equipment Recommendations  Other (comment) (pt has all equipment needs met. personal WC in her hospital room already.)       Precautions / Restrictions Precautions Precautions: Fall Other Brace: Cam boot per MD(poditry) on 11/3 does not need to wear CAM boot but does need to remain NWB Restrictions Weight Bearing Restrictions: Yes LLE Weight Bearing: Non weight bearing     Mobility  Bed Mobility Overal bed mobility: Modified Independent      General bed mobility comments: no physical assistance required to exit R side of bed    Transfers Overall  transfer level: Needs assistance Equipment used: Rolling walker (2 wheels) Transfers: Sit to/from Stand Sit to Stand: Supervision      General transfer comment: Pt was able to STS from lowest bed height and from low recliner surface without physical assistance or vcs for improved technique    Ambulation/Gait Ambulation/Gait assistance: Supervision Gait Distance (Feet): 12 Feet Assistive device: Rolling walker (2 wheels) Gait Pattern/deviations:  (" hop to.") Gait velocity: decreased   General Gait Details: Pt continues to demonstrate improving gait abilities while maintaining proper NWB restrictions. pt does well maintaining NWB and is cautions and safe during ambulation short distances. Has personal WC in room already for longer distances.      Balance Overall balance assessment: Needs assistance Sitting-balance support: No upper extremity supported Sitting balance-Leahy Scale: Good     Standing balance support: Bilateral upper extremity supported;Reliant on assistive device for balance Standing balance-Leahy Scale: Good       Cognition Arousal/Alertness: Awake/alert Behavior During Therapy: WFL for tasks assessed/performed Overall Cognitive Status: Within Functional Limits for tasks assessed        General Comments: Pt is A and O x 4 and extremely pleasant and motivated               Pertinent Vitals/Pain Pain Assessment: No/denies pain Pain Score: 0-No pain     PT Goals (current goals can now be found in the care plan section) Acute Rehab PT Goals Patient Stated Goal: To go home Progress towards PT goals: Progressing toward goals    Frequency  7X/week      PT Plan Current plan remains appropriate       AM-PAC PT "6 Clicks" Mobility   Outcome Measure  Help needed turning from your back to your side while in a flat bed without using bedrails?: None Help needed moving from lying on your back to sitting on the side of a flat bed without using  bedrails?: None Help needed moving to and from a bed to a chair (including a wheelchair)?: A Little Help needed standing up from a chair using your arms (e.g., wheelchair or bedside chair)?: A Little Help needed to walk in hospital room?: A Little Help needed climbing 3-5 steps with a railing? : A Lot 6 Click Score: 19    End of Session Equipment Utilized During Treatment: Gait belt Activity Tolerance: Patient tolerated treatment well Patient left: in chair;with call bell/phone within reach;with chair alarm set;with nursing/sitter in room Nurse Communication: Mobility status PT Visit Diagnosis: Other abnormalities of gait and mobility (R26.89);Muscle weakness (generalized) (M62.81)     Time: 1638-4536 PT Time Calculation (min) (ACUTE ONLY): 25 min  Charges:  $Gait Training: 8-22 mins $Therapeutic Activity: 8-22 mins                     Julaine Fusi PTA 04/19/21, 11:04 AM

## 2021-04-19 NOTE — Progress Notes (Signed)
PROGRESS NOTE    Chelsea Barton  GQQ:[id number] DOB: 23-Jul-1960 DOA: 04/10/2021 PCP: Burnard Hawthorne, FNP    Assessment & Plan:   Principal Problem:   Necrotizing cellulitis Active Problems:   Hypertension   Hyperlipidemia   Obesity   CKD (chronic kidney disease)   CAD (coronary artery disease)   Mitral valve disease   Benign essential hypertension   Long-term insulin use (HCC)   Secondary hyperparathyroidism of renal origin ([place])   Proliferative diabetic retinopathy associated with type 1 diabetes mellitus ([place])   Osteomyelitis of foot, left, acute (HCC)   Myonecrosis (HCC)   Left foot necrotizing/gas gangrene diabetic infection: w/ osteomyelitis. XR left foot with comminuted and displaced second proximal phalanx fracture, soft tissue emphysema involving the digits and forefoot around the fracture suggestive of necrotizing soft tissue infection. S/p I&D and left second toe amputation by podiatry, Dr. Amalia Hailey on 04/10/2021. S/p transmetatarsal amputation by podiatry, Dr. Posey Pronto on 04/12/2021. S/p debridement subcutaneous tissue & muscle in depth of amputation site necrosis & application of neg pressure wound therapy on 04/18/21 as per Dr. Sherryle Lis. Blood cxs NGTD. Operative culture grew prevotella disiens. Continue on IV zosyn as per ID. Not weightbearing left lower extremity. Will need f/u w/ [facility].    PVD: ABI 10/26 with moderate to severe left lower extremity peripheral artery disease.  S/p angiogram by vascular surgery on 04/13/2021 with arthrectomy left SFA and angioplasty with stent placement left superficial femoral artery. Continue on plavix as per vascular surg. Needs outpatient f/u    DM2: HbA1c 7.8, poorly controlled. Continue on glargine, SSI w/ accuchecks    AKI on CKDIV: baseline Cr 2.3-2.4. Cr is labile  Leukocytosis: likely secondary to above infection. Continue on IV zoysn   HTN: continue on nifedipine, coreg, hydralazine. Continue to hold  ACE-I secondary to AKI   Normocytic anemia: no need for a transfusion currently    Hyperlipidemia: continue on statin    Obesity: BMI 29.9. Would benefit from weight loss    DVT prophylaxis: SCDs Code Status: full  Family Communication:  Disposition Plan: likely d/c back home  Level of care: Med-Surg  Status is: Inpatient  Remains inpatient appropriate because: severity of illness     Consultants:    Procedures:   Antimicrobials: zosyn   Subjective: Pt c/o fatigue    Objective: Vitals:   04/18/21 1900 04/18/21 2259 04/19/21 0351 04/19/21 0741  BP: (!) 175/70 (!) 173/64 (!) 163/59 (!) 176/71  Pulse: 70 74 81 79  Resp: 16 16 16 15   Temp: 97.7 F (36.5 C) 98.1 F (36.7 C) 98.1 F (36.7 C) 97.9 F (36.6 C)  TempSrc: Oral  Oral Oral  SpO2: 97% 94% 92% 98%  Weight:      Height:        Intake/Output Summary (Last 24 hours) at 04/19/2021 0803 Last data filed at 04/18/2021 1801 Gross per 24 hour  Intake 200 ml  Output 20 ml  Net 180 ml   Filed Weights   04/10/21 1242 04/12/21 1537 04/13/21 1040  Weight: 86.2 kg 86.2 kg 81.6 kg    Examination:  General exam: Appears comfortable  Respiratory system: clear breath sounds b/l  Cardiovascular system: S1/S2+. No rubs or clicks  Gastrointestinal system: Abd is soft, NT, obese & normal bowel sounds  Central nervous system: Alert and oriented. Moves all extremities  Psychiatry: judgement and insight appears normal. Flat mood and affect     Data Reviewed: I have personally reviewed following  labs and imaging studies  CBC: Recent Labs  Lab 04/15/21 0427 04/16/21 0408 04/17/21 0237 04/18/21 0538 04/19/21 0700  WBC 14.3* 12.8* 13.3* 11.7* 13.3*  HGB 10.6* 10.1* 10.3* 10.6* 10.0*  HCT 33.3* 33.1* 32.7* 34.1* 33.0*  MCV 86.3 85.8 86.7 86.1 86.4  PLT 284 287 295 301 595   Basic Metabolic Panel: Recent Labs  Lab 04/15/21 0427 04/16/21 0408 04/17/21 0237 04/18/21 0538 04/19/21 0700  NA 139 138 136  138 139  K 4.3 4.0 3.7 3.8 3.9  CL 105 107 102 106 106  CO2 24 24 24 25 25   GLUCOSE 117* 143* 248* 159* 158*  BUN 20 22 23 20 22   CREATININE 2.48* 2.34* 2.43* 2.17* 2.19*  CALCIUM 8.5* 7.9* 8.3* 8.3* 8.3*   GFR: Estimated Creatinine Clearance: 28.4 mL/min (A) (by C-G formula based on SCr of 2.19 mg/dL (H)). Liver Function Tests: No results for input(s): AST, ALT, ALKPHOS, BILITOT, PROT, ALBUMIN in the last 168 hours. No results for input(s): LIPASE, AMYLASE in the last 168 hours. No results for input(s): AMMONIA in the last 168 hours. Coagulation Profile: No results for input(s): INR, PROTIME in the last 168 hours. Cardiac Enzymes: No results for input(s): CKTOTAL, CKMB, CKMBINDEX, TROPONINI in the last 168 hours. BNP (last 3 results) No results for input(s): PROBNP in the last 8760 hours. HbA1C: No results for input(s): HGBA1C in the last 72 hours. CBG: Recent Labs  Lab 04/18/21 0750 04/18/21 1149 04/18/21 1606 04/18/21 1813 04/18/21 2043  GLUCAP 155* 129* 75 91 138*   Lipid Profile: No results for input(s): CHOL, HDL, LDLCALC, TRIG, CHOLHDL, LDLDIRECT in the last 72 hours. Thyroid Function Tests: No results for input(s): TSH, T4TOTAL, FREET4, T3FREE, THYROIDAB in the last 72 hours. Anemia Panel: No results for input(s): VITAMINB12, FOLATE, FERRITIN, TIBC, IRON, RETICCTPCT in the last 72 hours. Sepsis Labs: No results for input(s): PROCALCITON, LATICACIDVEN in the last 168 hours.  Recent Results (from the past 240 hour(s))  Resp Panel by RT-PCR (Flu A&B, Covid) Nasopharyngeal Swab     Status: None   Collection Time: 04/10/21  2:28 PM   Specimen: Nasopharyngeal Swab; Nasopharyngeal(NP) swabs in vial transport medium  Result Value Ref Range Status   SARS Coronavirus 2 by RT PCR NEGATIVE NEGATIVE Final    Comment: (NOTE) SARS-CoV-2 target nucleic acids are NOT DETECTED.  The SARS-CoV-2 RNA is generally detectable in upper respiratory specimens during the acute phase  of infection. The lowest concentration of SARS-CoV-2 viral copies this assay can detect is 138 copies/mL. A negative result does not preclude SARS-Cov-2 infection and should not be used as the sole basis for treatment or other patient management decisions. A negative result may occur with  improper specimen collection/handling, submission of specimen other than nasopharyngeal swab, presence of viral mutation(s) within the areas targeted by this assay, and inadequate number of viral copies(<138 copies/mL). A negative result must be combined with clinical observations, patient history, and epidemiological information. The expected result is Negative.  Fact Sheet for Patients:  EntrepreneurPulse.com.au  Fact Sheet for Healthcare Providers:  IncredibleEmployment.be  This test is no t yet approved or cleared by the [place] FDA and  has been authorized for detection and/or diagnosis of SARS-CoV-2 by FDA under an Emergency Use Authorization (EUA). This EUA will remain  in effect (meaning this test can be used) for the duration of the COVID-19 declaration under Section 564(b)(1) of the Act, 21 U.S.C.section 360bbb-3(b)(1), unless the authorization is terminated  or revoked sooner.  Influenza A by PCR NEGATIVE NEGATIVE Final   Influenza B by PCR NEGATIVE NEGATIVE Final    Comment: (NOTE) The Xpert Xpress SARS-CoV-2/FLU/RSV plus assay is intended as an aid in the diagnosis of influenza from Nasopharyngeal swab specimens and should not be used as a sole basis for treatment. Nasal washings and aspirates are unacceptable for Xpert Xpress SARS-CoV-2/FLU/RSV testing.  Fact Sheet for Patients: EntrepreneurPulse.com.au  Fact Sheet for Healthcare Providers: IncredibleEmployment.be  This test is not yet approved or cleared by the [place] FDA and has been authorized for detection and/or diagnosis of  SARS-CoV-2 by FDA under an Emergency Use Authorization (EUA). This EUA will remain in effect (meaning this test can be used) for the duration of the COVID-19 declaration under Section 564(b)(1) of the Act, 21 U.S.C. section 360bbb-3(b)(1), unless the authorization is terminated or revoked.  Performed at [facility], [address] [place], [place] [address]   Blood culture (routine single)     Status: None   Collection Time: 04/10/21  2:37 PM   Specimen: BLOOD  Result Value Ref Range Status   Specimen Description BLOOD BLOOD LEFT FOREARM  Final   Special Requests   Final    BOTTLES DRAWN AEROBIC AND ANAEROBIC Blood Culture adequate volume   Culture   Final    NO GROWTH 5 DAYS Performed at [facility], [address], [place], [place] [address]    Report Status 04/15/2021 FINAL  Final  Culture, blood (single)     Status: None   Collection Time: 04/10/21  4:32 PM   Specimen: BLOOD  Result Value Ref Range Status   Specimen Description BLOOD BLOOD RIGHT HAND  Final   Special Requests   Final    BOTTLES DRAWN AEROBIC AND ANAEROBIC Blood Culture adequate volume   Culture   Final    NO GROWTH 5 DAYS Performed at [facility], [address], [place], [place] [address]    Report Status 04/15/2021 FINAL  Final  Aerobic/Anaerobic Culture w Gram Stain (surgical/deep wound)     Status: None   Collection Time: 04/10/21  8:34 PM   Specimen: PATH Other; Wound  Result Value Ref Range Status   Specimen Description   Final    FOOT LEFT Performed at [facility], [address] [place], [place] [address]    Special Requests   Final    NONE Performed at [facility], [place], [place] [address]    Gram Stain   Final    FEW SQUAMOUS EPITHELIAL CELLS PRESENT FEW WBC SEEN FEW GRAM POSITIVE RODS MODERATE GRAM NEGATIVE RODS MODERATE GRAM POSITIVE COCCI    Culture   Final    MODERATE PREVOTELLA DISIENS BETA LACTAMASE  POSITIVE NORMAL SKIN FLORA Performed at [facility], [place] [address] [place], [place] [address]    Report Status 04/15/2021 FINAL  Final  Aerobic Culture w Gram Stain (superficial specimen)     Status: None (Preliminary result)   Collection Time: 04/18/21  3:01 PM   Specimen: Wound  Result Value Ref Range Status   Specimen Description   Final    WOUND Performed at [facility], [address], [place], [place] [address]    Special Requests   Final    NONE Performed at [facility], [address] [place], [place] [address]    Gram Stain   Final    NO SQUAMOUS EPITHELIAL CELLS SEEN MODERATE WBC SEEN NO ORGANISMS SEEN  Performed at [facility], [place] [address] [place], [place] [address]    Culture PENDING  Incomplete   Report Status PENDING  Incomplete         Radiology Studies: No results found.      Scheduled Meds:  carvedilol  25 mg Oral BID WC   clopidogrel  75 mg Oral Q breakfast   dapagliflozin propanediol  10 mg Oral Daily   digoxin  125 mcg Oral Daily   hydrALAZINE  100 mg Oral Q8H   insulin aspart  0-15 Units Subcutaneous TID WC   insulin aspart  0-5 Units Subcutaneous QHS   insulin glargine-yfgn  25 Units Subcutaneous QHS   NIFEdipine  60 mg Oral Daily   rosuvastatin  10 mg Oral QHS   sodium chloride flush  3 mL Intravenous Q12H   Continuous Infusions:  sodium chloride 250 mL (04/13/21 2256)   piperacillin-tazobactam (ZOSYN)  IV 3.375 g (04/19/21 0001)     LOS: 8 days    Time spent: 20 mins     Wyvonnia Dusky, MD [facility] Pager [phone]  If 7PM-7AM, please contact night-coverage 04/19/2021, 8:03 AM

## 2021-04-19 NOTE — Progress Notes (Signed)
Chelsea Barton Pt is upset that she is not getting help to move to bed from chair Had debridement of the left TMA and has a wound vac  O/e awake and alert Patient Vitals for the past 24 hrs:  BP Temp Temp src Pulse Resp SpO2  04/19/21 1957 (!) 168/71 98.2 F (36.8 C) -- 73 17 96 %  04/19/21 1621 (!) 156/62 -- -- 76 16 97 %  04/19/21 0741 (!) 176/71 97.9 F (36.6 C) Oral 79 15 98 %  04/19/21 0351 (!) 163/59 98.1 F (36.7 C) Oral 81 16 92 %  04/18/21 2259 (!) 173/64 98.1 F (36.7 C) -- 74 16 94 %    Chest b/l air entry [id number] Left foot dressing not removed Wound vac  Labs CBC Latest Ref Rng & Units 04/19/2021 04/18/2021 04/17/2021  WBC 4.0 - 10.5 K/uL 13.3(H) 11.7(H) 13.3(H)  Hemoglobin 12.0 - 15.0 g/dL 10.0(L) 10.6(L) 10.3(L)  Hematocrit 36.0 - 46.0 % 33.0(L) 34.1(L) 32.7(L)  Platelets 150 - 400 K/uL 330 301 295    CMP Latest Ref Rng & Units 04/19/2021 04/18/2021 04/17/2021  Glucose 70 - 99 mg/dL 158(H) 159(H) 248(H)  BUN 8 - 23 mg/dL 22 20 23   Creatinine 0.44 - 1.00 mg/dL 2.19(H) 2.17(H) 2.43(H)  Sodium 135 - 145 mmol/L 139 138 136  Potassium 3.5 - 5.1 mmol/L 3.9 3.8 3.7  Chloride 98 - 111 mmol/L 106 106 102  CO2 22 - 32 mmol/L 25 25 24   Calcium 8.9 - 10.3 mg/dL 8.3(L) 8.3(L) 8.3(L)  Total Protein 6.5 - 8.1 g/dL - - -  Total Bilirubin 0.3 - 1.2 mg/dL - - -  Alkaline Phos 38 - 126 U/L - - -  AST 15 - 41 U/L - - -  ALT 0 - 44 U/L - - -     Micro 04/10/21 Strep anginosis Prevotella  Impression/recommendation ?Diabetic foot infection with necrotizing gangrenous infection left foot- s/p 2nd toe amputation followed by TMA Complicated by PAD and neuropathy On zosyn which can be changed to unasyn to cover the above organisms Has wound vac now Pathology of the TMA has viable margin free of osteo Will discuss with podiatrist regarding oral antibiotic on discharge    PAD- crosser atherectomy and stent placement in left SFA on 04/13/21   DM- management as per primary team   Anemia    CKD  Discussed the management with the patient Informed the head nurse regarding her needs

## 2021-04-20 DIAGNOSIS — M7989 Other specified soft tissue disorders: Secondary | ICD-10-CM | POA: Diagnosis not present

## 2021-04-20 DIAGNOSIS — M8610 Other acute osteomyelitis, unspecified site: Secondary | ICD-10-CM

## 2021-04-20 DIAGNOSIS — L089 Local infection of the skin and subcutaneous tissue, unspecified: Secondary | ICD-10-CM | POA: Diagnosis not present

## 2021-04-20 DIAGNOSIS — E11628 Type 2 diabetes mellitus with other skin complications: Secondary | ICD-10-CM | POA: Diagnosis not present

## 2021-04-20 LAB — BASIC METABOLIC PANEL
Anion gap: 8 (ref 5–15)
BUN: 25 mg/dL — ABNORMAL HIGH (ref 8–23)
CO2: 24 mmol/L (ref 22–32)
Calcium: 8.5 mg/dL — ABNORMAL LOW (ref 8.9–10.3)
Chloride: 108 mmol/L (ref 98–111)
Creatinine, Ser: 2.05 mg/dL — ABNORMAL HIGH (ref 0.44–1.00)
GFR, Estimated: 27 mL/min — ABNORMAL LOW (ref >60)
Glucose, Bld: 148 mg/dL — ABNORMAL HIGH (ref 70–99)
Potassium: 3.9 mmol/L (ref 3.5–5.1)
Sodium: 140 mmol/L (ref 135–145)

## 2021-04-20 LAB — AEROBIC/ANAEROBIC CULTURE W GRAM STAIN (SURGICAL/DEEP WOUND)

## 2021-04-20 LAB — GLUCOSE, CAPILLARY
Glucose-Capillary: 152 mg/dL — ABNORMAL HIGH (ref 70–99)
Glucose-Capillary: 181 mg/dL — ABNORMAL HIGH (ref 70–99)
Glucose-Capillary: 208 mg/dL — ABNORMAL HIGH (ref 70–99)
Glucose-Capillary: 192 mg/dL — ABNORMAL HIGH (ref 70–99)

## 2021-04-20 LAB — CBC
HCT: 31.4 % — ABNORMAL LOW (ref 36.0–46.0)
Hemoglobin: 9.5 g/dL — ABNORMAL LOW (ref 12.0–15.0)
MCH: 26.3 pg (ref 26.0–34.0)
MCHC: 30.3 g/dL (ref 30.0–36.0)
MCV: 87 fL (ref 80.0–100.0)
Platelets: 326 10*3/uL (ref 150–400)
RBC: 3.61 MIL/uL — ABNORMAL LOW (ref 3.87–5.11)
RDW: 14.4 % (ref 11.5–15.5)
WBC: 11.2 10*3/uL — ABNORMAL HIGH (ref 4.0–10.5)
nRBC: 0 % (ref 0.0–0.2)

## 2021-04-20 MED ORDER — SODIUM CHLORIDE 0.9 % IV SOLN
3.0000 g | Freq: Three times a day (TID) | INTRAVENOUS | Status: DC
  Administered 2021-04-20 – 2021-04-21 (×3): 3 g via INTRAVENOUS
  Filled 2021-04-20: qty 3
  Filled 2021-04-20: qty 8
  Filled 2021-04-20: qty 3
  Filled 2021-04-20 (×2): qty 8

## 2021-04-20 NOTE — Progress Notes (Signed)
PROGRESS NOTE    Chelsea Barton  DTO:[id number] DOB: 05-Sep-1960 DOA: 04/10/2021 PCP: Burnard Hawthorne, FNP    Assessment & Plan:   Principal Problem:   Necrotizing cellulitis Active Problems:   Hypertension   Hyperlipidemia   Obesity   CKD (chronic kidney disease)   CAD (coronary artery disease)   Mitral valve disease   Benign essential hypertension   Long-term insulin use (HCC)   Secondary hyperparathyroidism of renal origin ([place])   Proliferative diabetic retinopathy associated with type 1 diabetes mellitus ([place])   Osteomyelitis of foot, left, acute (HCC)   Myonecrosis (HCC)   Left foot necrotizing/gas gangrene diabetic infection: w/ osteomyelitis. XR left foot with comminuted and displaced second proximal phalanx fracture, soft tissue emphysema involving the digits and forefoot around the fracture suggestive of necrotizing soft tissue infection. S/p I&D and left second toe amputation by podiatry, Dr. Amalia Hailey on 04/10/2021. S/p transmetatarsal amputation by podiatry, Dr. Posey Pronto on 04/12/2021. S/p debridement subcutaneous tissue & muscle in depth of amputation site necrosis & application of neg pressure wound therapy on 04/18/21 as per Dr. Sherryle Lis. Blood cxs NGTD. Operative culture grew prevotella disiens. Abxs changed to IV unasyn as per ID. Not weightbearing left lower extremity. Will need f/u w/  wound care center.    PVD: ABI 10/26 with moderate to severe left lower extremity peripheral artery disease.  S/p angiogram by vascular surgery on 04/13/2021 with arthrectomy left SFA and angioplasty with stent placement left superficial femoral artery. Continue on plavix as per vascular surg. Needs outpatient f/u    DM2: poorly controlled, HbA1c 7.8. Continue on glargine, SSI w/ accuchecks    AKI on CKDIV: baseline Cr 2.3-2.4. Cr is trending down from day prior.   Leukocytosis: likely secondary to above infection. Trending down   HTN: continue on hydralazine, CCB, BB.  Continue to hold ACE-I  Normocytic anemia: H&H are labile. Will transfuse if Hb < 7.0    Hyperlipidemia: continue on statin    Obesity: BMI 29.9. Would benefit from weight loss    DVT prophylaxis: SCDs Code Status: full  Family Communication:  Disposition Plan: likely d/c back home  Level of care: Med-Surg  Status is: Inpatient  Remains inpatient appropriate because: severity of illness     Consultants:    Procedures:   Antimicrobials: unasyn    Subjective: Pt c/o malaise   Objective: Vitals:   04/19/21 1621 04/19/21 1957 04/19/21 2327 04/20/21 0534  BP: (!) 156/62 (!) 168/71 (!) 166/62 (!) 180/71  Pulse: 76 73 71 80  Resp: 16 17 17 17   Temp:  98.2 F (36.8 C) 98.5 F (36.9 C) 98.1 F (36.7 C)  TempSrc:      SpO2: 97% 96% 91% 92%  Weight:      Height:        Intake/Output Summary (Last 24 hours) at 04/20/2021 0742 Last data filed at 04/19/2021 1909 Gross per 24 hour  Intake 240 ml  Output 25 ml  Net 215 ml   Filed Weights   04/10/21 1242 04/12/21 1537 04/13/21 1040  Weight: 86.2 kg 86.2 kg 81.6 kg    Examination:  General exam: Appears calm & comfortable   Respiratory system: clear breath sounds b/l. No rales or wheezes Cardiovascular system: S1 & S2+. No rubs or clicks  Gastrointestinal system: Abd is soft, obese, NT & normal bowel sounds  Central nervous system: alert and oriented. Moves all extremities  Psychiatry: judgement and insight appears normal. Flat mood and affect  Data Reviewed: I have personally reviewed following labs and imaging studies  CBC: Recent Labs  Lab 04/15/21 0427 04/16/21 0408 04/17/21 0237 04/18/21 0538 04/19/21 0700  WBC 14.3* 12.8* 13.3* 11.7* 13.3*  HGB 10.6* 10.1* 10.3* 10.6* 10.0*  HCT 33.3* 33.1* 32.7* 34.1* 33.0*  MCV 86.3 85.8 86.7 86.1 86.4  PLT 284 287 295 301 188   Basic Metabolic Panel: Recent Labs  Lab 04/15/21 0427 04/16/21 0408 04/17/21 0237 04/18/21 0538 04/19/21 0700  NA 139  138 136 138 139  K 4.3 4.0 3.7 3.8 3.9  CL 105 107 102 106 106  CO2 24 24 24 25 25   GLUCOSE 117* 143* 248* 159* 158*  BUN 20 22 23 20 22   CREATININE 2.48* 2.34* 2.43* 2.17* 2.19*  CALCIUM 8.5* 7.9* 8.3* 8.3* 8.3*   GFR: Estimated Creatinine Clearance: 28.4 mL/min (A) (by C-G formula based on SCr of 2.19 mg/dL (H)). Liver Function Tests: No results for input(s): AST, ALT, ALKPHOS, BILITOT, PROT, ALBUMIN in the last 168 hours. No results for input(s): LIPASE, AMYLASE in the last 168 hours. No results for input(s): AMMONIA in the last 168 hours. Coagulation Profile: No results for input(s): INR, PROTIME in the last 168 hours. Cardiac Enzymes: No results for input(s): CKTOTAL, CKMB, CKMBINDEX, TROPONINI in the last 168 hours. BNP (last 3 results) No results for input(s): PROBNP in the last 8760 hours. HbA1C: No results for input(s): HGBA1C in the last 72 hours. CBG: Recent Labs  Lab 04/18/21 2043 04/19/21 0810 04/19/21 1204 04/19/21 1608 04/19/21 2042  GLUCAP 138* 142* 191* 194* 219*   Lipid Profile: No results for input(s): CHOL, HDL, LDLCALC, TRIG, CHOLHDL, LDLDIRECT in the last 72 hours. Thyroid Function Tests: No results for input(s): TSH, T4TOTAL, FREET4, T3FREE, THYROIDAB in the last 72 hours. Anemia Panel: No results for input(s): VITAMINB12, FOLATE, FERRITIN, TIBC, IRON, RETICCTPCT in the last 72 hours. Sepsis Labs: No results for input(s): PROCALCITON, LATICACIDVEN in the last 168 hours.  Recent Results (from the past 240 hour(s))  Resp Panel by RT-PCR (Flu A&B, Covid) Nasopharyngeal Swab     Status: None   Collection Time: 04/10/21  2:28 PM   Specimen: Nasopharyngeal Swab; Nasopharyngeal(NP) swabs in vial transport medium  Result Value Ref Range Status   SARS Coronavirus 2 by RT PCR NEGATIVE NEGATIVE Final    Comment: (NOTE) SARS-CoV-2 target nucleic acids are NOT DETECTED.  The SARS-CoV-2 RNA is generally detectable in upper respiratory specimens during the  acute phase of infection. The lowest concentration of SARS-CoV-2 viral copies this assay can detect is 138 copies/mL. A negative result does not preclude SARS-Cov-2 infection and should not be used as the sole basis for treatment or other patient management decisions. A negative result may occur with  improper specimen collection/handling, submission of specimen other than nasopharyngeal swab, presence of viral mutation(s) within the areas targeted by this assay, and inadequate number of viral copies(<138 copies/mL). A negative result must be combined with clinical observations, patient history, and epidemiological information. The expected result is Negative.  Fact Sheet for Patients:  EntrepreneurPulse.com.au  Fact Sheet for Healthcare Providers:  IncredibleEmployment.be  This test is no t yet approved or cleared by the [place] FDA and  has been authorized for detection and/or diagnosis of SARS-CoV-2 by FDA under an Emergency Use Authorization (EUA). This EUA will remain  in effect (meaning this test can be used) for the duration of the COVID-19 declaration under Section 564(b)(1) of the Act, 21 U.S.C.section 360bbb-3(b)(1), unless the authorization  is terminated  or revoked sooner.       Influenza A by PCR NEGATIVE NEGATIVE Final   Influenza B by PCR NEGATIVE NEGATIVE Final    Comment: (NOTE) The Xpert Xpress SARS-CoV-2/FLU/RSV plus assay is intended as an aid in the diagnosis of influenza from Nasopharyngeal swab specimens and should not be used as a sole basis for treatment. Nasal washings and aspirates are unacceptable for Xpert Xpress SARS-CoV-2/FLU/RSV testing.  Fact Sheet for Patients: EntrepreneurPulse.com.au  Fact Sheet for Healthcare Providers: IncredibleEmployment.be  This test is not yet approved or cleared by the [place] FDA and has been authorized for detection and/or  diagnosis of SARS-CoV-2 by FDA under an Emergency Use Authorization (EUA). This EUA will remain in effect (meaning this test can be used) for the duration of the COVID-19 declaration under Section 564(b)(1) of the Act, 21 U.S.C. section 360bbb-3(b)(1), unless the authorization is terminated or revoked.  Performed at [facility], [address] [place], [place] [address]   Blood culture (routine single)     Status: None   Collection Time: 04/10/21  2:37 PM   Specimen: BLOOD  Result Value Ref Range Status   Specimen Description BLOOD BLOOD LEFT FOREARM  Final   Special Requests   Final    BOTTLES DRAWN AEROBIC AND ANAEROBIC Blood Culture adequate volume   Culture   Final    NO GROWTH 5 DAYS Performed at [facility], [address] [place], [place] [address]    Report Status 04/15/2021 FINAL  Final  Culture, blood (single)     Status: None   Collection Time: 04/10/21  4:32 PM   Specimen: BLOOD  Result Value Ref Range Status   Specimen Description BLOOD BLOOD RIGHT HAND  Final   Special Requests   Final    BOTTLES DRAWN AEROBIC AND ANAEROBIC Blood Culture adequate volume   Culture   Final    NO GROWTH 5 DAYS Performed at [facility], [address], [place], [place] [address]    Report Status 04/15/2021 FINAL  Final  Aerobic/Anaerobic Culture w Gram Stain (surgical/deep wound)     Status: None (Preliminary result)   Collection Time: 04/10/21  8:34 PM   Specimen: PATH Other; Wound  Result Value Ref Range Status   Specimen Description   Final    FOOT LEFT Performed at [facility], [address] [place], [place] [address]    Special Requests   Final    NONE Performed at [facility], [address] [place], [place] [address]    Gram Stain   Final    FEW SQUAMOUS EPITHELIAL CELLS PRESENT FEW WBC SEEN FEW GRAM POSITIVE RODS MODERATE GRAM NEGATIVE RODS MODERATE GRAM POSITIVE COCCI    Culture   Final    MODERATE  PREVOTELLA DISIENS BETA LACTAMASE POSITIVE MODERATE STREPTOCOCCUS ANGINOSIS MODERATE DIPHTHEROIDS(CORYNEBACTERIUM SPECIES) Standardized susceptibility testing for this organism is not available. Performed at [facility], [place] [address] [place], [place] [address]    Report Status PENDING  Incomplete  Aerobic Culture w Gram Stain (superficial specimen)     Status: None (Preliminary result)   Collection Time: 04/18/21  3:01 PM   Specimen: Wound  Result Value Ref Range Status   Specimen Description   Final    WOUND Performed at [facility], [address] [place], [place] [address]    Special Requests   Final    NONE Performed at [facility], [place],  [place], [place] [address]    Gram Stain   Final    NO SQUAMOUS EPITHELIAL CELLS SEEN MODERATE WBC SEEN NO ORGANISMS SEEN    Culture   Final    NO GROWTH < 24 HOURS Performed at [facility], [place] [address], [place],  [address]    Report Status PENDING  Incomplete         Radiology Studies: No results found.      Scheduled Meds:  carvedilol  25 mg Oral BID WC   clopidogrel  75 mg Oral Q breakfast   dapagliflozin propanediol  10 mg Oral Daily   digoxin  125 mcg Oral Daily   hydrALAZINE  100 mg Oral Q8H   insulin aspart  0-15 Units Subcutaneous TID WC   insulin aspart  0-5 Units Subcutaneous QHS   insulin glargine-yfgn  25 Units Subcutaneous QHS   NIFEdipine  60 mg Oral Daily   rosuvastatin  10 mg Oral QHS   sodium chloride flush  3 mL Intravenous Q12H   Continuous Infusions:  sodium chloride 250 mL (04/20/21 0553)   ampicillin-sulbactam (UNASYN) IV 3 g (04/20/21 0553)     LOS: 9 days    Time spent: 15 mins     Wyvonnia Dusky, MD [facility] Pager [phone]  If 7PM-7AM, please contact night-coverage 04/20/2021, 7:42 AM

## 2021-04-20 NOTE — Progress Notes (Addendum)
Physical Therapy Treatment Patient Details Name: Chelsea Barton MRN: [id number] DOB: [birth year] Today's Date: 04/20/2021   History of Present Illness Pt is a 60 y.o. 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 PMH includes HTN, CAD, DM, CKD IV, mitral valve insufficiency.    PT Comments    Pt was long sitting in bed upon arriving. She agrees to session and is cooperative throughout. Easily and safely able to exit bed, stand and ambulate while maintaining NWB. She demonstrates safe ability to self propel manual w/c without assistance. Pt is doing extremely well from a PT standpoint. Will decrease frequency as appropriate due to pt doing so well. Follow MD recs for follow up PT at DC. Recommend pt get up with RN staff frequently.    Recommendations for follow up therapy are one component of a multi-disciplinary discharge planning process, led by the attending physician.  Recommendations may be updated based on patient status, additional functional criteria and insurance authorization.  Follow Up Recommendations  Follow physician's recommendations for discharge plan and follow up therapies     Assistance Recommended at Discharge PRN  Equipment Recommendations  None recommended by PT       Precautions / Restrictions Precautions Precautions: Fall Other Brace: Cam boot per MD(poditry) on 11/3 does not need to wear CAM boot but does need to remain NWB Restrictions Weight Bearing Restrictions: Yes LLE Weight Bearing: Non weight bearing     Mobility  Bed Mobility Overal bed mobility: Modified Independent     Transfers Overall transfer level: Needs assistance Equipment used: Rolling walker (2 wheels) Transfers: Sit to/from Stand Sit to Stand: Supervision       Ambulation/Gait Ambulation/Gait assistance: Supervision Gait Distance (Feet): 25 Feet Assistive device: Rolling walker (2  wheels) Gait Pattern/deviations:  (" hop to") Gait velocity: decreased   General Gait Details: Pt demonstrated safe ability to "hop" ~ 25 ft without LOB or safety concerns    Architect Wheelchair mobility: Yes Wheelchair propulsion: Both upper extremities Wheelchair parts: Independent Distance: 200    Balance Overall balance assessment: Needs assistance Sitting-balance support: No upper extremity supported Sitting balance-Leahy Scale: Good     Standing balance support: Bilateral upper extremity supported;Reliant on assistive device for balance Standing balance-Leahy Scale: Good        Cognition Arousal/Alertness: Awake/alert Behavior During Therapy: WFL for tasks assessed/performed Overall Cognitive Status: Within Functional Limits for tasks assessed      General Comments: Pt is A and O x 4 and extremely pleasant and motivated               Pertinent Vitals/Pain Pain Assessment: No/denies pain Pain Score: 0-No pain Pain Descriptors / Indicators: Aching;Sore Pain Intervention(s): Limited activity within patient's tolerance;Monitored during session;Premedicated before session;Repositioned;Ice applied     PT Goals (current goals can now be found in the care plan section) Acute Rehab PT Goals Patient Stated Goal: To go home Progress towards PT goals: Progressing toward goals    Frequency    7X/week      PT Plan Frequency needs to be updated;Other (comment) (Will decrease frequency due to pt progressing well towards all acute care goals)       AM-PAC PT "6 Clicks" Mobility   Outcome Measure  Help needed turning from your back to your side while in a flat bed without using bedrails?: None Help needed moving from lying on your back to  sitting on the side of a flat bed without using bedrails?: None Help needed moving to and from a bed to a chair (including a wheelchair)?: None Help needed standing up from a chair using your  arms (e.g., wheelchair or bedside chair)?: None Help needed to walk in hospital room?: None Help needed climbing 3-5 steps with a railing? : A Lot 6 Click Score: 22    End of Session Equipment Utilized During Treatment: Gait belt Activity Tolerance: Patient tolerated treatment well Patient left: in chair;with call bell/phone within reach;with chair alarm set;with nursing/sitter in room Nurse Communication: Mobility status PT Visit Diagnosis: Other abnormalities of gait and mobility (R26.89);Muscle weakness (generalized) (M62.81)     Time: 3709-6438 PT Time Calculation (min) (ACUTE ONLY): 25 min  Charges:  $Gait Training: 8-22 mins $Therapeutic Activity: 8-22 mins                     Julaine Fusi PTA 04/20/21, 12:30 PM

## 2021-04-20 NOTE — Anesthesia Postprocedure Evaluation (Signed)
Anesthesia Post Note  Patient: Chelsea Barton  Procedure(s) Performed: INCISION AND DRAINAGE (Left: Foot)  Patient location during evaluation: PACU Anesthesia Type: General Level of consciousness: awake and alert Pain management: pain level controlled Vital Signs Assessment: post-procedure vital signs reviewed and stable Respiratory status: spontaneous breathing, nonlabored ventilation, respiratory function stable and patient connected to nasal cannula oxygen Cardiovascular status: blood pressure returned to baseline and stable Postop Assessment: no apparent nausea or vomiting Anesthetic complications: no   No notable events documented.   Last Vitals:  Vitals:   04/20/21 1157 04/20/21 1539  BP: (!) 175/69 (!) 164/67  Pulse: 68 82  Resp: 16 14  Temp: 36.6 C 36.7 C  SpO2: 97% 98%    Last Pain:  Vitals:   04/20/21 1539  TempSrc: Oral  PainSc:                  Martha Clan

## 2021-04-20 NOTE — Progress Notes (Signed)
ID Pt is upset that she is not getting help to move to bed from chair Had debridement of the left TMA and has a wound vac  O/e awake and alert Patient Vitals for the past 24 hrs:  BP Temp Temp src Pulse Resp SpO2  04/20/21 1539 (!) 164/67 98.1 F (36.7 C) Oral 82 14 98 %  04/20/21 1157 (!) 175/69 97.9 F (36.6 C) Oral 68 16 97 %  04/20/21 0803 (!) 149/67 98.3 F (36.8 C) Oral 79 16 96 %  04/20/21 0534 (!) 180/71 98.1 F (36.7 C) -- 80 17 92 %  04/19/21 2327 (!) 166/62 98.5 F (36.9 C) -- 71 17 91 %  04/19/21 1957 (!) 168/71 98.2 F (36.8 C) -- 73 17 96 %    Chest b/l air entry [id number] Left foot dressing not removed Wound vac  Labs CBC Latest Ref Rng & Units 04/20/2021 04/19/2021 04/18/2021  WBC 4.0 - 10.5 K/uL 11.2(H) 13.3(H) 11.7(H)  Hemoglobin 12.0 - 15.0 g/dL 9.5(L) 10.0(L) 10.6(L)  Hematocrit 36.0 - 46.0 % 31.4(L) 33.0(L) 34.1(L)  Platelets 150 - 400 K/uL 326 330 301    CMP Latest Ref Rng & Units 04/20/2021 04/19/2021 04/18/2021  Glucose 70 - 99 mg/dL 148(H) 158(H) 159(H)  BUN 8 - 23 mg/dL 25(H) 22 20  Creatinine 0.44 - 1.00 mg/dL 2.05(H) 2.19(H) 2.17(H)  Sodium 135 - 145 mmol/L 140 139 138  Potassium 3.5 - 5.1 mmol/L 3.9 3.9 3.8  Chloride 98 - 111 mmol/L 108 106 106  CO2 22 - 32 mmol/L 24 25 25   Calcium 8.9 - 10.3 mg/dL 8.5(L) 8.3(L) 8.3(L)  Total Protein 6.5 - 8.1 g/dL - - -  Total Bilirubin 0.3 - 1.2 mg/dL - - -  Alkaline Phos 38 - 126 U/L - - -  AST 15 - 41 U/L - - -  ALT 0 - 44 U/L - - -     Micro 04/10/21 Strep anginosis Prevotella  Impression/recommendation ?Diabetic foot infection with necrotizing gangrenous infection left foot- s/p 2nd toe amputation followed by TMA Complicated by PAD and neuropathy On unasyn Has wound vac now Pathology of the TMA has viable margin free of osteo Will discuss with podiatrist regarding oral antibiotic on discharge instaed of IV- it could be augmentin X 2 weeks    PAD- crosser atherectomy and stent placement in left  SFA on 04/13/21   DM- management as per primary team   Anemia   CKD  Discussed the management with the patient Discussed with care team ID will follow peripherally this weekend

## 2021-04-20 NOTE — Progress Notes (Signed)
Pharmacy Antibiotic Note  Chelsea Barton is a 60 y.o. female admitted on 04/10/2021 with DFI. Pharmacy has been consulted for Unasyn dosing.  Presented with 2-day history of blister on left foot with swelling. PMH includes DM on insulin, HLD, PAD, neuropathy. Found to have gangrenous infection of left foot.   Xray of foot and history suggesting necrotizing soft tissue infection. On 10/25, received second toe amputation, then transmetatarsal amputation on 10/27. On 10/28 underwent angio and crosser atherectomy and stent placement. Patient reportedly has significant PAD, as poor perfusion noted in the areas of necrosis per podiatry.  As necrosis seems PAD related, will transition to Unasyn to cover organisms obtained in surgical culture.   Scr improving over last several days (Scr 2.17>2.19>2.05), now CrCl >30. Patient seems to be at baseline renal fxn (BL 2.4), therefore cautiously dose adjust Unasyn while also balancing severity of infection.  Plan: Unasyn 3 grams every 8 hours.   Monitor renal function, LOT and clinical course.    Height: 5\' 5"  (165.1 cm) Weight: 81.6 kg (180 lb) IBW/kg (Calculated) : 57  Temp (24hrs), Avg:98.3 F (36.8 C), Min:98.1 F (36.7 C), Max:98.5 F (36.9 C)  Recent Labs  Lab 04/16/21 0408 04/17/21 0237 04/18/21 0538 04/19/21 0700 04/20/21 0645  WBC 12.8* 13.3* 11.7* 13.3* 11.2*  CREATININE 2.34* 2.43* 2.17* 2.19* 2.05*     Estimated Creatinine Clearance: 30.4 mL/min (A) (by C-G formula based on SCr of 2.05 mg/dL (H)).    No Known Allergies  Antimicrobials this admission: 10/25 Zosyn >> 11/3 10/25 Linezolid >> 11/3 11/3 Unasyn >>   Dose adjustments this admission: None  Microbiology results: 10/25 surgical cx: moderate Prevotella disiens (beta lactamse+), moderate Strep anginosis, moderate diphtheroids (Corynebacterium species) 10/25 BCx: NG 11/2 superficial cx: no squamous epithelial cells seen, ng < 24 hours   Thank you for allowing  pharmacy to be a part of this patient's care.   Wynelle Cleveland, PharmD Pharmacy Resident  04/20/2021 9:10 AM

## 2021-04-21 LAB — CBC
HCT: 31.4 % — ABNORMAL LOW (ref 36.0–46.0)
Hemoglobin: 9.6 g/dL — ABNORMAL LOW (ref 12.0–15.0)
MCH: 27.1 pg (ref 26.0–34.0)
MCHC: 30.6 g/dL (ref 30.0–36.0)
MCV: 88.7 fL (ref 80.0–100.0)
Platelets: 318 10*3/uL (ref 150–400)
RBC: 3.54 MIL/uL — ABNORMAL LOW (ref 3.87–5.11)
RDW: 14.6 % (ref 11.5–15.5)
WBC: 11.4 10*3/uL — ABNORMAL HIGH (ref 4.0–10.5)
nRBC: 0 % (ref 0.0–0.2)

## 2021-04-21 LAB — BASIC METABOLIC PANEL
Anion gap: 8 (ref 5–15)
BUN: 25 mg/dL — ABNORMAL HIGH (ref 8–23)
CO2: 24 mmol/L (ref 22–32)
Calcium: 8.5 mg/dL — ABNORMAL LOW (ref 8.9–10.3)
Chloride: 108 mmol/L (ref 98–111)
Creatinine, Ser: 1.91 mg/dL — ABNORMAL HIGH (ref 0.44–1.00)
GFR, Estimated: 29 mL/min — ABNORMAL LOW (ref >60)
Glucose, Bld: 167 mg/dL — ABNORMAL HIGH (ref 70–99)
Potassium: 3.8 mmol/L (ref 3.5–5.1)
Sodium: 140 mmol/L (ref 135–145)

## 2021-04-21 LAB — GLUCOSE, CAPILLARY
Glucose-Capillary: 153 mg/dL — ABNORMAL HIGH (ref 70–99)
Glucose-Capillary: 183 mg/dL — ABNORMAL HIGH (ref 70–99)
Glucose-Capillary: 196 mg/dL — ABNORMAL HIGH (ref 70–99)

## 2021-04-21 MED ORDER — SODIUM CHLORIDE 0.9 % IV SOLN
3.0000 g | Freq: Four times a day (QID) | INTRAVENOUS | Status: DC
  Administered 2021-04-21 – 2021-04-24 (×12): 3 g via INTRAVENOUS
  Filled 2021-04-21 (×2): qty 3
  Filled 2021-04-21 (×5): qty 8
  Filled 2021-04-21 (×3): qty 3
  Filled 2021-04-21: qty 8
  Filled 2021-04-21 (×2): qty 3
  Filled 2021-04-21 (×4): qty 8

## 2021-04-21 NOTE — Progress Notes (Signed)
   PODIATRY PROGRESS NOTE  NAME Chelsea Barton MRN [id number] DOB 10-Jun-1961 DOA 04/10/2021   Reason for consult: LT foot gangrene Patient status post multiple I&D with excisional debridement of left foot wound.  Last surgery 04/18/2021 at which time the foot was debrided and irrigated negative pressure wound VAC applied.  Presenting today with wound VAC intact.  Approximately 300 mL drainage in the canister.  Patient resting comfortably in bed with minimal pain     Assessment/plan of care: 1.  S/P partial left foot amputation with repeat I&D and placement of Neg Pressure Wound Vac.  S/P LLE angiogram with intervention 04/13/2021. -Patient remains high risk BKA -The best course for limb salvage would be to maintain the negative pressure wound VAC outpatient and healed the amputation stump secondarily.  Order placed for wound nurse to reapply the wound VAC. -Recommend case manager to arrange outpatient wound VAC if possible -Continue antibiotics as per infectious disease.  As always their input is always greatly appreciated -Follow-up in office within 1 week of discharge.  From a surgical standpoint the patient is okay to be discharged pending wound VAC arrangements. -Strict nonweightbearing LLE.  Chief Complaint  Patient presents with   Foot Pain     Past Medical History:  Diagnosis Date   Diabetes mellitus    Hyperlipidemia    Hypertension     CBC Latest Ref Rng & Units 04/21/2021 04/20/2021 04/19/2021  WBC 4.0 - 10.5 K/uL 11.4(H) 11.2(H) 13.3(H)  Hemoglobin 12.0 - 15.0 g/dL 9.6(L) 9.5(L) 10.0(L)  Hematocrit 36.0 - 46.0 % 31.4(L) 31.4(L) 33.0(L)  Platelets 150 - 400 K/uL 318 326 330    BMP Latest Ref Rng & Units 04/21/2021 04/20/2021 04/19/2021  Glucose 70 - 99 mg/dL 167(H) 148(H) 158(H)  BUN 8 - 23 mg/dL 25(H) 25(H) 22  Creatinine 0.44 - 1.00 mg/dL 1.91(H) 2.05(H) 2.19(H)  Sodium 135 - 145 mmol/L 140 140 139  Potassium 3.5 - 5.1 mmol/L 3.8 3.9 3.9  Chloride 98 - 111 mmol/L  108 108 106  CO2 22 - 32 mmol/L 24 24 25   Calcium 8.9 - 10.3 mg/dL 8.5(L) 8.5(L) 8.3(L)     Please contact me directly with any questions or concerns.  Cell [phone]   Edrick Kins, DPM [facility]  Dr. Edrick Kins, DPM    [address], [place] [address]                Office [phone]  Fax [phone]

## 2021-04-21 NOTE — Progress Notes (Signed)
PROGRESS NOTE    Chelsea Barton  GUY:[id number] DOB: 04-19-1961 DOA: 04/10/2021 PCP: Burnard Hawthorne, FNP    Assessment & Plan:   Principal Problem:   Necrotizing cellulitis Active Problems:   Hypertension   Hyperlipidemia   Obesity   CKD (chronic kidney disease)   CAD (coronary artery disease)   Mitral valve disease   Benign essential hypertension   Long-term insulin use (HCC)   Secondary hyperparathyroidism of renal origin ([place])   Proliferative diabetic retinopathy associated with type 1 diabetes mellitus ([place])   Osteomyelitis of foot, left, acute (HCC)   Myonecrosis (HCC)   Left foot necrotizing/gas gangrene diabetic infection: w/ osteomyelitis. XR left foot with comminuted and displaced second proximal phalanx fracture, soft tissue emphysema involving the digits and forefoot around the fracture suggestive of necrotizing soft tissue infection. S/p I&D and left second toe amputation by podiatry, Dr. Amalia Hailey on 04/10/2021. S/p transmetatarsal amputation by podiatry, Dr. Posey Pronto on 04/12/2021. S/p debridement subcutaneous tissue & muscle in depth of amputation site necrosis & application of neg pressure wound therapy on 04/18/21 as per Dr. Sherryle Lis. Blood cxs NGTD. Operative culture grew prevotella disiens. Continue on IV unasyn as per ID. May have to go home w/ wound vac as per podiatry. Not weightbearing left lower extremity. Will need f/u w/ [facility] wound care center.    PVD: ABI 10/26 with moderate to severe left lower extremity peripheral artery disease.  S/p angiogram by vascular surgery on 04/13/2021 with arthrectomy left SFA and angioplasty with stent placement left superficial femoral artery. Continue on plavix as per vascular surg. Needs outpatient f/u    DM2: HbA1c 7.8, poorly controlled. Continue on glargine, SSI w/ accuchecks    AKI on CKDIV: baseline Cr 2.3-2.4. Cr is trending down again today   Leukocytosis: likely secondary to above infection   HTN: continue on  hydralazine, coreg, nifedipine. Continue to hold ACE-I  Normocytic anemia: H&H are labile. No need for a transfusion currently    Hyperlipidemia: continue on statin    Obesity: BMI 29.9. Would benefit from weight loss    DVT prophylaxis: SCDs Code Status: full  Family Communication:  Disposition Plan: likely d/c back home  Level of care: Med-Surg  Status is: Inpatient  Remains inpatient appropriate because: severity of illness     Consultants:    Procedures:   Antimicrobials: unasyn    Subjective: Pt c/o fatigue   Objective: Vitals:   04/20/21 1157 04/20/21 1539 04/20/21 2002 04/21/21 0409  BP: (!) 175/69 (!) 164/67 (!) 174/68 (!) 147/65  Pulse: 68 82 82 75  Resp: 16 14 17 17   Temp: 97.9 F (36.6 C) 98.1 F (36.7 C) 98.4 F (36.9 C) 98.1 F (36.7 C)  TempSrc: Oral Oral    SpO2: 97% 98% 93% 92%  Weight:      Height:        Intake/Output Summary (Last 24 hours) at 04/21/2021 0653 Last data filed at 04/21/2021 0414 Gross per 24 hour  Intake 381.08 ml  Output 2 ml  Net 379.08 ml   Filed Weights   04/10/21 1242 04/12/21 1537 04/13/21 1040  Weight: 86.2 kg 86.2 kg 81.6 kg    Examination:  General exam: Appears comfortable  Respiratory system: clear breath sounds b/l. No rales Cardiovascular system: S1/S2+. No gallops or clicks  Gastrointestinal system: Abd is soft, NT, ND & normal bowel sounds  Central nervous system: alert and oriented. Moves all extremities  Psychiatry: judgement and insight appear normal. Flat mood and affect  Data Reviewed: I have personally reviewed following labs and imaging studies  CBC: Recent Labs  Lab 04/17/21 0237 04/18/21 0538 04/19/21 0700 04/20/21 0645 04/21/21 0359  WBC 13.3* 11.7* 13.3* 11.2* 11.4*  HGB 10.3* 10.6* 10.0* 9.5* 9.6*  HCT 32.7* 34.1* 33.0* 31.4* 31.4*  MCV 86.7 86.1 86.4 87.0 88.7  PLT 295 301 330 326 631   Basic Metabolic Panel: Recent Labs  Lab 04/17/21 0237 04/18/21 0538  04/19/21 0700 04/20/21 0645 04/21/21 0359  NA 136 138 139 140 140  K 3.7 3.8 3.9 3.9 3.8  CL 102 106 106 108 108  CO2 24 25 25 24 24   GLUCOSE 248* 159* 158* 148* 167*  BUN 23 20 22  25* 25*  CREATININE 2.43* 2.17* 2.19* 2.05* 1.91*  CALCIUM 8.3* 8.3* 8.3* 8.5* 8.5*   GFR: Estimated Creatinine Clearance: 32.6 mL/min (A) (by C-G formula based on SCr of 1.91 mg/dL (H)). Liver Function Tests: No results for input(s): AST, ALT, ALKPHOS, BILITOT, PROT, ALBUMIN in the last 168 hours. No results for input(s): LIPASE, AMYLASE in the last 168 hours. No results for input(s): AMMONIA in the last 168 hours. Coagulation Profile: No results for input(s): INR, PROTIME in the last 168 hours. Cardiac Enzymes: No results for input(s): CKTOTAL, CKMB, CKMBINDEX, TROPONINI in the last 168 hours. BNP (last 3 results) No results for input(s): PROBNP in the last 8760 hours. HbA1C: No results for input(s): HGBA1C in the last 72 hours. CBG: Recent Labs  Lab 04/19/21 2042 04/20/21 0755 04/20/21 1154 04/20/21 1632 04/20/21 2003  GLUCAP 219* 152* 181* 208* 192*   Lipid Profile: No results for input(s): CHOL, HDL, LDLCALC, TRIG, CHOLHDL, LDLDIRECT in the last 72 hours. Thyroid Function Tests: No results for input(s): TSH, T4TOTAL, FREET4, T3FREE, THYROIDAB in the last 72 hours. Anemia Panel: No results for input(s): VITAMINB12, FOLATE, FERRITIN, TIBC, IRON, RETICCTPCT in the last 72 hours. Sepsis Labs: No results for input(s): PROCALCITON, LATICACIDVEN in the last 168 hours.  Recent Results (from the past 240 hour(s))  Aerobic Culture w Gram Stain (superficial specimen)     Status: None (Preliminary result)   Collection Time: 04/18/21  3:01 PM   Specimen: Wound  Result Value Ref Range Status   Specimen Description   Final    WOUND Performed at [facility], [address], [place], [place] [address]    Special Requests   Final    NONE Performed at [facility], [address] [place], [place] [address]    Gram Stain   Final    NO SQUAMOUS EPITHELIAL CELLS SEEN MODERATE WBC SEEN NO ORGANISMS SEEN    Culture   Final    RARE STREPTOCOCCUS ANGINOSIS CULTURE REINCUBATED FOR BETTER GROWTH Performed at [facility], [place] [address], [place], [place] [address]    Report Status PENDING  Incomplete         Radiology Studies: No results found.      Scheduled Meds:  carvedilol  25 mg Oral BID WC   clopidogrel  75 mg Oral Q breakfast   dapagliflozin propanediol  10 mg Oral Daily   digoxin  125 mcg Oral Daily   hydrALAZINE  100 mg Oral Q8H   insulin aspart  0-15 Units Subcutaneous TID WC   insulin aspart  0-5 Units Subcutaneous QHS   insulin glargine-yfgn  25 Units Subcutaneous QHS   NIFEdipine  60 mg Oral Daily   rosuvastatin  10 mg Oral QHS   sodium chloride flush  3 mL Intravenous Q12H   Continuous Infusions:  sodium chloride 10 mL/hr at 04/21/21 0617   ampicillin-sulbactam (UNASYN) IV 3 g (04/21/21 6314)     LOS: 10 days    Time spent: 15 mins     Wyvonnia Dusky, MD [facility] Pager [phone]  If 7PM-7AM, please contact night-coverage 04/21/2021, 6:53 AM

## 2021-04-21 NOTE — Progress Notes (Signed)
Pharmacy Antibiotic Note  Chelsea Barton is a 60 y.o. female admitted on 04/10/2021 with DFI. Pharmacy has been consulted for Unasyn dosing.  Presented with 2-day history of blister on left foot with swelling. PMH includes DM on insulin, HLD, PAD, neuropathy. Found to have gangrenous infection of left foot.   Xray of foot and history suggesting necrotizing soft tissue infection. On 10/25, received second toe amputation, then transmetatarsal amputation on 10/27. On 10/28 underwent angio and crosser atherectomy and stent placement. Patient reportedly has significant PAD, as poor perfusion noted in the areas of necrosis per podiatry.  As necrosis seems PAD related, will transition to Unasyn to cover organisms obtained in surgical culture.   Scr improving over last several days (Scr 2.17>2.19>2.05>1.91), now CrCl >30.  Plan:  Unasyn 3 g IV q6h (CrCl > 30 mL/min)  Monitor renal function, LOT and clinical course.    Height: 5\' 5"  (165.1 cm) Weight: 81.6 kg (180 lb) IBW/kg (Calculated) : 57  Temp (24hrs), Avg:98.1 F (36.7 C), Min:97.9 F (36.6 C), Max:98.4 F (36.9 C)  Recent Labs  Lab 04/17/21 0237 04/18/21 0538 04/19/21 0700 04/20/21 0645 04/21/21 0359  WBC 13.3* 11.7* 13.3* 11.2* 11.4*  CREATININE 2.43* 2.17* 2.19* 2.05* 1.91*     Estimated Creatinine Clearance: 32.6 mL/min (A) (by C-G formula based on SCr of 1.91 mg/dL (H)).    No Known Allergies  Antimicrobials this admission: 10/25 Zosyn >> 11/3 10/25 Linezolid >> 11/3 11/3 Unasyn >>   Microbiology results: 10/25 Surgical Cx: Prevotella disiens (beta lactamase +), Streptococcus anginosis, diphtheroids (Corynebacterium species) 10/25 BCx: NG 11/2 Superficial Cx: Streptococcus anginosis   Thank you for allowing pharmacy to be a part of this patient's care.  Benita Gutter 04/21/2021 8:46 AM

## 2021-04-22 LAB — CBC
HCT: 32.4 % — ABNORMAL LOW (ref 36.0–46.0)
Hemoglobin: 9.6 g/dL — ABNORMAL LOW (ref 12.0–15.0)
MCH: 26.2 pg (ref 26.0–34.0)
MCHC: 29.6 g/dL — ABNORMAL LOW (ref 30.0–36.0)
MCV: 88.5 fL (ref 80.0–100.0)
Platelets: 322 10*3/uL (ref 150–400)
RBC: 3.66 MIL/uL — ABNORMAL LOW (ref 3.87–5.11)
RDW: 14.6 % (ref 11.5–15.5)
WBC: 12.6 10*3/uL — ABNORMAL HIGH (ref 4.0–10.5)
nRBC: 0 % (ref 0.0–0.2)

## 2021-04-22 LAB — AEROBIC CULTURE W GRAM STAIN (SUPERFICIAL SPECIMEN): Gram Stain: NONE SEEN

## 2021-04-22 LAB — BASIC METABOLIC PANEL
Anion gap: 5 (ref 5–15)
BUN: 25 mg/dL — ABNORMAL HIGH (ref 8–23)
CO2: 24 mmol/L (ref 22–32)
Calcium: 8.5 mg/dL — ABNORMAL LOW (ref 8.9–10.3)
Chloride: 110 mmol/L (ref 98–111)
Creatinine, Ser: 1.78 mg/dL — ABNORMAL HIGH (ref 0.44–1.00)
GFR, Estimated: 32 mL/min — ABNORMAL LOW (ref >60)
Glucose, Bld: 168 mg/dL — ABNORMAL HIGH (ref 70–99)
Potassium: 3.7 mmol/L (ref 3.5–5.1)
Sodium: 139 mmol/L (ref 135–145)

## 2021-04-22 LAB — GLUCOSE, CAPILLARY
Glucose-Capillary: 140 mg/dL — ABNORMAL HIGH (ref 70–99)
Glucose-Capillary: 294 mg/dL — ABNORMAL HIGH (ref 70–99)
Glucose-Capillary: 269 mg/dL — ABNORMAL HIGH (ref 70–99)
Glucose-Capillary: 171 mg/dL — ABNORMAL HIGH (ref 70–99)

## 2021-04-22 MED ORDER — DAPAGLIFLOZIN PROPANEDIOL 10 MG PO TABS
10.0000 mg | ORAL_TABLET | Freq: Every day | ORAL | Status: DC
  Administered 2021-04-22 – 2021-04-24 (×3): 10 mg via ORAL
  Filled 2021-04-22 (×4): qty 1

## 2021-04-22 NOTE — Consult Note (Signed)
WOC Nurse Consult Note: Reason for Consult:Apply NPWT device to left LE.  Consult received for NPWT device to be replaced.  [facility] services are not available on the weekends.  Will see on Monday.  Secure Chat sent to patient's Bedside RN to communicate the above.  [facility] nursing team will follow, and will remain available to this patient, the nursing and medical teams beginning Monday, 04/23/21.   Thanks, Maudie Flakes, MSN, RN, [place], Arther Abbott  Pager# [phone]

## 2021-04-22 NOTE — Progress Notes (Signed)
PROGRESS NOTE    Chelsea Barton  IFO:[id number] DOB: 24-Oct-1960 DOA: 04/10/2021 PCP: Burnard Hawthorne, FNP    Assessment & Plan:   Principal Problem:   Necrotizing cellulitis Active Problems:   Hypertension   Hyperlipidemia   Obesity   CKD (chronic kidney disease)   CAD (coronary artery disease)   Mitral valve disease   Benign essential hypertension   Long-term insulin use (HCC)   Secondary hyperparathyroidism of renal origin ([place])   Proliferative diabetic retinopathy associated with type 1 diabetes mellitus ([place])   Osteomyelitis of foot, left, acute (HCC)   Myonecrosis (HCC)   Left foot necrotizing/gas gangrene diabetic infection: w/ osteomyelitis. S/p I&D and left second toe amputation by podiatry, Dr. Amalia Hailey on 04/10/2021. S/p transmetatarsal amputation by podiatry, Dr. Posey Pronto on 04/12/2021. S/p debridement subcutaneous tissue & muscle in depth of amputation site necrosis & application of neg pressure wound therapy on 04/18/21 as per Dr. Sherryle Lis. Blood cxs NGTD. Operative culture grew prevotella disiens, strep angionosis & diptheroids. Continue on IV unasyn as per ID. CM working on getting a wound vac to go home with. Not weightbearing left lower extremity. Will need f/u w/ [facility] wound care center.    PVD: ABI 10/26 with moderate to severe left lower extremity peripheral artery disease.  S/p angiogram by vascular surgery on 04/13/2021 with arthrectomy left SFA and angioplasty with stent placement left superficial femoral artery. Continue on plavix as per vascular surg. Needs outpatient f/u    DM2: poorly controlled, HbA1c 7.8. Continue on glargine, SSI w/ accuchecks    AKI on CKDIV: baseline Cr 2.3-2.4. Cr is trending down again today   Leukocytosis: labile. Likely secondary to above infection   HTN: continue on nifedipine, coreg, hydralazine. Continue to hold ACE-I   Normocytic anemia: H&H are stable. No need for a transfusion currently    Hyperlipidemia: continue on  statin     Obesity: BMI 29.9. Would benefit from weight loss    DVT prophylaxis: SCDs Code Status: full  Family Communication:  Disposition Plan: likely d/c back home  Level of care: Med-Surg  Status is: Inpatient  Remains inpatient appropriate because: severity of illness     Consultants:    Procedures:   Antimicrobials: unasyn    Subjective: Pt c/o malaise   Objective: Vitals:   04/21/21 1520 04/21/21 2032 04/22/21 0455 04/22/21 0734  BP: (!) 166/65 (!) 159/63 (!) 176/66 (!) 155/68  Pulse: 73 71 72 75  Resp: 18 20 20 16   Temp: 98 F (36.7 C) 97.9 F (36.6 C) 98.5 F (36.9 C) 98.2 F (36.8 C)  TempSrc:      SpO2: 93% 96% 97% 100%  Weight:      Height:        Intake/Output Summary (Last 24 hours) at 04/22/2021 0736 Last data filed at 04/21/2021 1800 Gross per 24 hour  Intake 240.78 ml  Output 25 ml  Net 215.78 ml   Filed Weights   04/10/21 1242 04/12/21 1537 04/13/21 1040  Weight: 86.2 kg 86.2 kg 81.6 kg    Examination:  General exam: Appears calm & comfortable  Respiratory system: clear breath sounds b/l. No wheezes, rales or rhonchi  Cardiovascular system: S1 & S2+. No rubs or clicks  Gastrointestinal system: Abd is soft, NT, ND & hypoactive bowel sounds  Central nervous system: Alert and oriented. Moves all extremities  Psychiatry: judgement and insight appear normal. Appropriate mood and affect    Data Reviewed: I have personally reviewed following labs and imaging  studies  CBC: Recent Labs  Lab 04/18/21 0538 04/19/21 0700 04/20/21 0645 04/21/21 0359 04/22/21 0352  WBC 11.7* 13.3* 11.2* 11.4* 12.6*  HGB 10.6* 10.0* 9.5* 9.6* 9.6*  HCT 34.1* 33.0* 31.4* 31.4* 32.4*  MCV 86.1 86.4 87.0 88.7 88.5  PLT 301 330 326 318 335   Basic Metabolic Panel: Recent Labs  Lab 04/18/21 0538 04/19/21 0700 04/20/21 0645 04/21/21 0359 04/22/21 0352  NA 138 139 140 140 139  K 3.8 3.9 3.9 3.8 3.7  CL 106 106 108 108 110  CO2 25 25 24 24 24    GLUCOSE 159* 158* 148* 167* 168*  BUN 20 22 25* 25* 25*  CREATININE 2.17* 2.19* 2.05* 1.91* 1.78*  CALCIUM 8.3* 8.3* 8.5* 8.5* 8.5*   GFR: Estimated Creatinine Clearance: 35 mL/min (A) (by C-G formula based on SCr of 1.78 mg/dL (H)). Liver Function Tests: No results for input(s): AST, ALT, ALKPHOS, BILITOT, PROT, ALBUMIN in the last 168 hours. No results for input(s): LIPASE, AMYLASE in the last 168 hours. No results for input(s): AMMONIA in the last 168 hours. Coagulation Profile: No results for input(s): INR, PROTIME in the last 168 hours. Cardiac Enzymes: No results for input(s): CKTOTAL, CKMB, CKMBINDEX, TROPONINI in the last 168 hours. BNP (last 3 results) No results for input(s): PROBNP in the last 8760 hours. HbA1C: No results for input(s): HGBA1C in the last 72 hours. CBG: Recent Labs  Lab 04/20/21 1632 04/20/21 2003 04/21/21 0827 04/21/21 1158 04/21/21 2102  GLUCAP 208* 192* 153* 183* 196*   Lipid Profile: No results for input(s): CHOL, HDL, LDLCALC, TRIG, CHOLHDL, LDLDIRECT in the last 72 hours. Thyroid Function Tests: No results for input(s): TSH, T4TOTAL, FREET4, T3FREE, THYROIDAB in the last 72 hours. Anemia Panel: No results for input(s): VITAMINB12, FOLATE, FERRITIN, TIBC, IRON, RETICCTPCT in the last 72 hours. Sepsis Labs: No results for input(s): PROCALCITON, LATICACIDVEN in the last 168 hours.  Recent Results (from the past 240 hour(s))  Aerobic Culture w Gram Stain (superficial specimen)     Status: None (Preliminary result)   Collection Time: 04/18/21  3:01 PM   Specimen: Wound  Result Value Ref Range Status   Specimen Description   Final    WOUND Performed at [facility], [address], [place], [place] [address]    Special Requests   Final    NONE Performed at [facility], [address] [place], [place] [address]    Gram Stain   Final    NO SQUAMOUS EPITHELIAL CELLS SEEN MODERATE WBC SEEN NO ORGANISMS SEEN     Culture   Final    RARE STREPTOCOCCUS ANGINOSIS SUSCEPTIBILITIES TO FOLLOW Performed at [facility], [place] [address], [place],  [address]    Report Status PENDING  Incomplete         Radiology Studies: No results found.      Scheduled Meds:  carvedilol  25 mg Oral BID WC   clopidogrel  75 mg Oral Q breakfast   dapagliflozin propanediol  10 mg Oral Daily   digoxin  125 mcg Oral Daily   hydrALAZINE  100 mg Oral Q8H   insulin aspart  0-15 Units Subcutaneous TID WC   insulin aspart  0-5 Units Subcutaneous QHS   insulin glargine-yfgn  25 Units Subcutaneous QHS   NIFEdipine  60 mg Oral Daily   rosuvastatin  10 mg Oral QHS   sodium chloride flush  3 mL Intravenous Q12H   Continuous Infusions:  sodium chloride 250  mL (04/21/21 2318)   ampicillin-sulbactam (UNASYN) IV 3 g (04/22/21 0509)     LOS: 11 days    Time spent: 15 mins     Wyvonnia Dusky, MD [facility] Pager [phone]  If 7PM-7AM, please contact night-coverage 04/22/2021, 7:36 AM

## 2021-04-22 NOTE — TOC Transition Note (Addendum)
Transition of Care [facility]) - CM/SW Discharge Note   Patient Details  Name: Chelsea Barton MRN: [id number] Date of Birth: Apr 24, 1961  Transition of Care [facility]) CM/SW Contact:  Harriet Masson, RN Phone Number: 04/22/2021, 1:25 PM   Clinical Narrative:     Pt will need VAC set up at the pt's residence. Spoke with [place] [facility] and pt indicated she would be at the address for recovery [address], [place], [place]. Pt aware and updated accordingly.  No other needs presented. TOC will continue to be available for any additional needs.  Addendum: Attending requested VAC system be set up prior to pt's discharge. Call [facility]) and requested set up here at [facility] prior to d/c. Pt has been updated.   Final next level of care: Home/Self Care Barriers to Discharge: Continued Medical Work up   Patient Goals and CMS Choice        Discharge Placement                       Discharge Plan and Services   Discharge Planning Services: CM Consult            DME Arranged: [place] rolling DME Agency: [facility] Date DME Agency Contacted: 04/13/21 Time DME Agency Contacted: [phone] Representative spoke with at DME Agency: [place] ([place]) Interventions     Readmission Risk Interventions No flowsheet data found.

## 2021-04-23 DIAGNOSIS — Z89432 Acquired absence of left foot: Secondary | ICD-10-CM

## 2021-04-23 DIAGNOSIS — L97502 Non-pressure chronic ulcer of other part of unspecified foot with fat layer exposed: Secondary | ICD-10-CM

## 2021-04-23 DIAGNOSIS — E08621 Diabetes mellitus due to underlying condition with foot ulcer: Secondary | ICD-10-CM

## 2021-04-23 LAB — GLUCOSE, CAPILLARY
Glucose-Capillary: 190 mg/dL — ABNORMAL HIGH (ref 70–99)
Glucose-Capillary: 116 mg/dL — ABNORMAL HIGH (ref 70–99)
Glucose-Capillary: 217 mg/dL — ABNORMAL HIGH (ref 70–99)
Glucose-Capillary: 250 mg/dL — ABNORMAL HIGH (ref 70–99)
Glucose-Capillary: 159 mg/dL — ABNORMAL HIGH (ref 70–99)

## 2021-04-23 LAB — BASIC METABOLIC PANEL
Anion gap: 9 (ref 5–15)
BUN: 28 mg/dL — ABNORMAL HIGH (ref 8–23)
CO2: 25 mmol/L (ref 22–32)
Calcium: 8.9 mg/dL (ref 8.9–10.3)
Chloride: 107 mmol/L (ref 98–111)
Creatinine, Ser: 1.88 mg/dL — ABNORMAL HIGH (ref 0.44–1.00)
GFR, Estimated: 30 mL/min — ABNORMAL LOW (ref >60)
Glucose, Bld: 130 mg/dL — ABNORMAL HIGH (ref 70–99)
Potassium: 4.8 mmol/L (ref 3.5–5.1)
Sodium: 141 mmol/L (ref 135–145)

## 2021-04-23 LAB — CBC
HCT: 33.3 % — ABNORMAL LOW (ref 36.0–46.0)
Hemoglobin: 10 g/dL — ABNORMAL LOW (ref 12.0–15.0)
MCH: 26.7 pg (ref 26.0–34.0)
MCHC: 30 g/dL (ref 30.0–36.0)
MCV: 89 fL (ref 80.0–100.0)
Platelets: 358 10*3/uL (ref 150–400)
RBC: 3.74 MIL/uL — ABNORMAL LOW (ref 3.87–5.11)
RDW: 15.2 % (ref 11.5–15.5)
WBC: 11.5 10*3/uL — ABNORMAL HIGH (ref 4.0–10.5)
nRBC: 0 % (ref 0.0–0.2)

## 2021-04-23 MED ORDER — POLYETHYLENE GLYCOL 3350 17 G PO PACK
17.0000 g | PACK | Freq: Every day | ORAL | Status: DC
Start: 2021-04-23 — End: 2021-04-24
  Filled 2021-04-23: qty 1

## 2021-04-23 MED ORDER — BISACODYL 5 MG PO TBEC: 10.0000 mg | DELAYED_RELEASE_TABLET | Freq: Every day | ORAL | Status: DC | PRN

## 2021-04-23 MED ORDER — DOCUSATE SODIUM 100 MG PO CAPS
200.0000 mg | ORAL_CAPSULE | Freq: Two times a day (BID) | ORAL | Status: DC
  Filled 2021-04-23 (×2): qty 2

## 2021-04-23 NOTE — Progress Notes (Addendum)
   Date of Admission:  04/10/2021     ID: Chelsea Barton is a 60 y.o. female Principal Problem:   Necrotizing cellulitis Active Problems:   Hypertension   Hyperlipidemia   Obesity   CKD (chronic kidney disease)   CAD (coronary artery disease)   Mitral valve disease   Benign essential hypertension   Long-term insulin use (HCC)   Secondary hyperparathyroidism of renal origin ([place])   Proliferative diabetic retinopathy associated with type 1 diabetes mellitus (HCC)   Osteomyelitis of foot, left, acute ([place])   Myonecrosis (HCC)    Pt doing okay Waiting for wound vac to be delivered home  Medications:   carvedilol  25 mg Oral BID WC   clopidogrel  75 mg Oral Q breakfast   dapagliflozin propanediol  10 mg Oral Daily   digoxin  125 mcg Oral Daily   docusate sodium  200 mg Oral BID   hydrALAZINE  100 mg Oral Q8H   insulin aspart  0-15 Units Subcutaneous TID WC   insulin aspart  0-5 Units Subcutaneous QHS   insulin glargine-yfgn  25 Units Subcutaneous QHS   NIFEdipine  60 mg Oral Daily   polyethylene glycol  17 g Oral Daily   rosuvastatin  10 mg Oral QHS   sodium chloride flush  3 mL Intravenous Q12H    Objective: Vital signs in last 24 hours: Temp:  [97.9 F (36.6 C)-98.4 F (36.9 C)] 98 F (36.7 C) (11/07 0726) Pulse Rate:  [72-75] 72 (11/07 0726) Resp:  [16-20] 20 (11/07 0444) BP: (168-170)/(58-72) 168/72 (11/07 0726) SpO2:  [92 %-96 %] 96 % (11/07 0726)  PHYSICAL EXAM:  General: Alert, cooperative, no distress, appears stated age.  Head: Normocephalic, without obvious abnormality, atraumatic. Eyes: Conjunctivae clear, anicteric sclerae. Pupils are equal ENT Nares normal. No drainage or sinus tenderness. Lips, mucosa, and tongue normal. No Thrush edentulous Neck: Supple, symmetrical, no adenopathy, thyroid: non tender no carotid bruit and no JVD. Back: No CVA tenderness. Lungs: Clear to auscultation bilaterally. No Wheezing or Rhonchi. No rales. Heart: Regular  rate and rhythm, no murmur, rub or gallop. Abdomen: Soft, non-tender,not distended. Bowel sounds normal. No masses Extremities: left foot- wound vac   Skin: No rashes or lesions. Or bruising Lymph: Cervical, supraclavicular normal. Neurologic: Grossly non-focal  Lab Results Recent Labs    04/22/21 0352 04/23/21 0552  WBC 12.6* 11.5*  HGB 9.6* 10.0*  HCT 32.4* 33.3*  NA 139 141  K 3.7 4.8  CL 110 107  CO2 24 25  BUN 25* 28*  CREATININE 1.78* 1.88*  Microbiology: Wound left foot- 04/10/21 Strep anginosis Prevotella Studies/Results: No results found.   Assessment/Plan: ?Diabetic foot infection with necrotizing gangrenous infection left foot- s/p 2nd toe amputation followed by TMA Complicated by PAD and neuropathy On unasyn Has wound vac now Pathology of the TMA has viable margin free of osteo Continue IV unasyn while in patient On discharge will go on PO augmentin 875mg  BID until 05/09/21  the wound margins have  high risk for ischemia and progression   PAD- crosser atherectomy and stent placement in left SFA on 04/13/21   DM- management as per primary team   Anemia   CKD   Discussed the management with the patient Discussed with care team

## 2021-04-23 NOTE — TOC Progression Note (Addendum)
Transition of Care [facility]) - Progression Note    Patient Details  Name: Chelsea Barton MRN: [id number] Date of Birth: 1960-12-11  Transition of Care [facility]) CM/SW [place], RN Phone Number: 04/23/2021, 9:48 AM  Clinical Narrative:    Reached out to [facility] at [facility] to follow up on Wound Vac for the patient to dc home, patient to have outpatient PT and outpatient wound care,  I reached out to [place] at [place], [place] at [place], Sharyn Creamer at [place], Malachy Mood [facility], Cristie Hem at [facility] and Judson Roch at [place] to see if I could get the patient nursing for Wound Vac care, Awaiting a response  [facility] can't take the patient [facility] is not INN with [facility], [facility] is Not INN with [facility] is Not INN with [facility] can't accept [facility] is not INN with [facility]  Expected Discharge Plan: Home/Self Care Barriers to Discharge: Continued Medical Work up  Expected Discharge Plan and Services Expected Discharge Plan: Home/Self Care   Discharge Planning Services: CM Consult                     DME Arranged: [place] Rile rolling DME Agency: [facility] Date DME Agency Contacted: 04/13/21 Time DME Agency Contacted: 41 Representative spoke with at DME Agency: [place] ([place]) Interventions    Readmission Risk Interventions No flowsheet data found.

## 2021-04-23 NOTE — Progress Notes (Signed)
Occupational Therapy Treatment Patient Details Name: Chelsea Barton MRN: [id number] DOB: 08-May-1961 Today's Date: 04/23/2021   History of present illness Pt is a 60 y.o. 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 PMH includes HTN, CAD, DM, CKD IV, mitral valve insufficiency.   OT comments  Pt seen for OT treatment on this date. Upon arrival to room, pt awake and seated upright in chair. Pt reporting no pain and agreeable to OT tx. Pt currently presents with decreased balance and decreased safety awareness. Due to these functional impairments, pt requires MIN GUARD for transfers to/from [facility], MIN GUARD for functional mobility of short household distances with RW, and MIN GUARD for standing grooming & hygiene tasks. This date, pt demonstrated good adherence to NWB status of LLE, however required verbal cues for safe hand placement during ADL transfers with RW use. Pt is making good progress toward goals and continues to benefit from skilled OT services to maximize return to PLOF and minimize risk of future falls, injury, caregiver burden, and readmission. Will continue to follow POC. Discharge recommendation remains appropriate.     Recommendations for follow up therapy are one component of a multi-disciplinary discharge planning process, led by the attending physician.  Recommendations may be updated based on patient status, additional functional criteria and insurance authorization.    Follow Up Recommendations  Home health OT    Assistance Recommended at Discharge Intermittent Supervision/Assistance  Equipment Recommendations  BSC/3in1       Precautions / Restrictions Precautions Precautions: Fall Required Braces or Orthoses: Other Brace Other Brace: Cam boot per MD(poditry) on 11/3 does not need to wear CAM boot but does need to remain NWB Restrictions Weight Bearing Restrictions: Yes LLE  Weight Bearing: Non weight bearing       Mobility Bed Mobility               General bed mobility comments: not assessed, pt in recliner at beginning/end of session    Transfers Overall transfer level: Needs assistance Equipment used: Rolling walker (2 wheels) Transfers: Sit to/from Stand Sit to Stand: Min guard           General transfer comment: x5 sit<>stand transfers. Requires verbal cues for safe hand placement with RW use     Balance Overall balance assessment: Needs assistance Sitting-balance support: No upper extremity supported;Feet supported Sitting balance-Leahy Scale: Good Sitting balance - Comments: Good sitting balance within BSC   Standing balance support: Bilateral upper extremity supported;Reliant on assistive device for balance Standing balance-Leahy Scale: Fair Standing balance comment: Requires MIN GUARD for functional mobility of short household distances with RW                           ADL either performed or assessed with clinical judgement   ADL Overall ADL's : Needs assistance/impaired     Grooming: Wash/dry face;Oral care;Min guard;Standing Grooming Details (indicate cue type and reason): MIN GUARD in setting of pt resting forearms on sink counter             Lower Body Dressing: Supervision/safety;Sitting/lateral leans Lower Body Dressing Details (indicate cue type and reason): to don/doff R sock Toilet Transfer: Min Dispensing optician Details (indicate cue type and reason): requires verbal cues for safe hand placement with RW use         Functional mobility during ADLs: Min guard;Rolling walker (2 wheels)  Extremity/Trunk Assessment Upper Extremity Assessment Upper Extremity Assessment: Overall WFL for tasks assessed   Lower Extremity Assessment Lower Extremity Assessment: LLE deficits/detail LLE Deficits / Details: s/p transmetatarsal amputation L foot         Cognition  Arousal/Alertness: Awake/alert Behavior During Therapy: WFL for tasks assessed/performed Overall Cognitive Status: Within Functional Limits for tasks assessed                                                       Pertinent Vitals/ Pain       Pain Assessment: No/denies pain         Frequency  Min 1X/week        Progress Toward Goals  OT Goals(current goals can now be found in the care plan section)  Progress towards OT goals: Progressing toward goals  Acute Rehab OT Goals Patient Stated Goal: to go home OT Goal Formulation: With patient Time For Goal Achievement: 04/29/21 Potential to Achieve Goals: Good  Plan Discharge plan remains appropriate;Frequency remains appropriate       AM-PAC OT "6 Clicks" Daily Activity     Outcome Measure   Help from another person eating meals?: None Help from another person taking care of personal grooming?: A Little Help from another person toileting, which includes using toliet, bedpan, or urinal?: A Little Help from another person bathing (including washing, rinsing, drying)?: A Little Help from another person to put on and taking off regular upper body clothing?: None Help from another person to put on and taking off regular lower body clothing?: A Little 6 Click Score: 20    End of Session Equipment Utilized During Treatment: Rolling walker (2 wheels)  OT Visit Diagnosis: Unsteadiness on feet (R26.81);Other abnormalities of gait and mobility (R26.89)   Activity Tolerance Patient tolerated treatment well   Patient Left in chair;with call bell/phone within reach   Nurse Communication Mobility status        Time: 1530-1556 OT Time Calculation (min): 26 min  Charges: OT General Charges $OT Visit: 1 Visit OT Treatments $Self Care/Home Management : 23-37 mins  Fredirick Maudlin, [place]

## 2021-04-23 NOTE — Progress Notes (Signed)
Physical Therapy Treatment Patient Details Name: Chelsea Barton MRN: [id number] DOB: Mar 20, 1961 Today's Date: 04/23/2021   History of Present Illness Pt is a 60 y.o. 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 PMH includes HTN, CAD, DM, CKD IV, mitral valve insufficiency.    PT Comments    Pt received sitting EOB agreeable to PT. Remains mod-I to further mobilize to EOB with supervision to stand to RW. Able to perform hop to pattern with good understanding of NWB precautions on L foot with RW within room only requiring supervision and excellent UE strength. Declining need to perform w/c mobility today and per previous PT note pt independent with w/c mobility. Pt educated on benefits of NWB strength exercises on LLE to maintain strength due to current NWB status. Pt verbalizing understanding of all education provided. Will continue to follow 63 recs for discharge.  Recommendations for follow up therapy are one component of a multi-disciplinary discharge planning process, led by the attending physician.  Recommendations may be updated based on patient status, additional functional criteria and insurance authorization.  Follow Up Recommendations  Follow physician's recommendations for discharge plan and follow up therapies     Assistance Recommended at Discharge PRN  Equipment Recommendations  None recommended by PT    Recommendations for Other Services       Precautions / Restrictions Precautions Precautions: Fall Required Braces or Orthoses: Other Brace Other Brace: Cam boot per MD(poditry) on 11/3 does not need to wear CAM boot but does need to remain NWB Restrictions Weight Bearing Restrictions: Yes LLE Weight Bearing: Non weight bearing     Mobility  Bed Mobility Overal bed mobility: Modified Independent               Patient Response:  Cooperative  Transfers Overall transfer level: Needs assistance Equipment used: Rolling walker (2 wheels) Transfers: Sit to/from Stand Sit to Stand: Supervision                Ambulation/Gait Ambulation/Gait assistance: Supervision Gait Distance (Feet): 25 Feet Assistive device: Rolling walker (2 wheels) Gait Pattern/deviations:  (hop to pattern)       General Gait Details: Safe ability to perform hop to gait pattern on RLE maintaining precautions on L foot. No balance or safety concerns noted.   Stairs             Secretary/administrator: declined need to perform further w/c mobility  Modified Rankin (Stroke Patients Only)       Balance Overall balance assessment: Needs assistance Sitting-balance support: No upper extremity supported Sitting balance-Leahy Scale: Good     Standing balance support: Bilateral upper extremity supported;Reliant on assistive device for balance Standing balance-Leahy Scale: Good                              Cognition Arousal/Alertness: Awake/alert Behavior During Therapy: WFL for tasks assessed/performed Overall Cognitive Status: Within Functional Limits for tasks assessed                                          Exercises Other Exercises Other Exercises: Educated on LAQ's, hamstring curls, standing marches, hip extension on LLE for strengthening of NWB limb    General Comments  Pertinent Vitals/Pain Pain Assessment: No/denies pain    Home Living                          Prior Function            PT Goals (current goals can now be found in the care plan section) Acute Rehab PT Goals Patient Stated Goal: To go home PT Goal Formulation: With patient Time For Goal Achievement: 04/27/21 Potential to Achieve Goals: Good Progress towards PT goals: Progressing toward goals    Frequency    Min 2X/week      PT Plan Current plan remains  appropriate    Co-evaluation              AM-PAC PT "6 Clicks" Mobility   Outcome Measure  Help needed turning from your back to your side while in a flat bed without using bedrails?: None Help needed moving from lying on your back to sitting on the side of a flat bed without using bedrails?: None Help needed moving to and from a bed to a chair (including a wheelchair)?: None Help needed standing up from a chair using your arms (e.g., wheelchair or bedside chair)?: None Help needed to walk in hospital room?: None Help needed climbing 3-5 steps with a railing? : A Lot 6 Click Score: 22    End of Session Equipment Utilized During Treatment: Gait belt Activity Tolerance: Patient tolerated treatment well Patient left: in bed;with bed alarm set;with call bell/phone within reach Nurse Communication: Mobility status PT Visit Diagnosis: Other abnormalities of gait and mobility (R26.89);Muscle weakness (generalized) (M62.81)     Time: 4825-0037 PT Time Calculation (min) (ACUTE ONLY): 10 min  Charges:  $Therapeutic Exercise: 8-22 mins                    Salem Caster. Fairly IV, PT, DPT Physical Therapist- [facility]  04/23/2021, 12:33 PM

## 2021-04-23 NOTE — Consult Note (Addendum)
[place] Nurse Consult Note: Patient receiving care in [facility]. Reason for Consult: NPWT to left foot amputation site Wound type: amputation site Pressure Injury POA: Yes/No/NA Measurement: 5.6 cm x 6.2 cm x 1.5 cm Wound bed: pink, but slightly darkened. Drainage (amount, consistency, odor) serosanginous on existing gauze dressing which the patient explained was placed on Saturday 04/21/21. Periwound: Sutures to lateral surgical site are intact. Patient tolerated the gauze dressing removal after I soaked the dressing with sterile saline, and she had no complaints of pain during the VAC application. Dressing procedure/placement/frequency: Mepitel placed over the wound bed and sutures. One piece of black foam placed over the wound, drape applied, immediate seal obtained. Foot under the Trac pad tubing padded with an ABD pad. 4" Ace wrap placed to secure the dressing.  Val Riles, RN, MSN, CWOCN, CNS-BC, pager [phone]

## 2021-04-23 NOTE — Progress Notes (Signed)
PROGRESS NOTE    Chelsea Barton  XKG:[id number] DOB: [birth year] DOA: 04/10/2021 PCP: Burnard Hawthorne, FNP    Assessment & Plan:   Principal Problem:   Necrotizing cellulitis Active Problems:   Hypertension   Hyperlipidemia   Obesity   CKD (chronic kidney disease)   CAD (coronary artery disease)   Mitral valve disease   Benign essential hypertension   Long-term insulin use (HCC)   Secondary hyperparathyroidism of renal origin ([place])   Proliferative diabetic retinopathy associated with type 1 diabetes mellitus ([place])   Osteomyelitis of foot, left, acute (HCC)   Myonecrosis (HCC)   Left foot necrotizing/gas gangrene diabetic infection: w/ osteomyelitis. S/p I&D and left second toe amputation by podiatry, Dr. Amalia Hailey on 04/10/2021. S/p transmetatarsal amputation by podiatry, Dr. Posey Pronto on 04/12/2021. S/p debridement subcutaneous tissue & muscle in depth of amputation site necrosis & application of neg pressure wound therapy on 04/18/21 as per Dr. Sherryle Lis. Blood cxs NGTD. Operative culture grew prevotella disiens, strep angionosis & diptheroids. Continue on IV unasyn as per ID. Not weightbearing left lower extremity. Will need f/u w/ [place] wound care center. CM is still working on getting a pt a wound vac to go home with.    PVD: ABI 10/26 with moderate to severe left lower extremity peripheral artery disease.  S/p angiogram by vascular surgery on 04/13/2021 with arthrectomy left SFA and angioplasty with stent placement left superficial femoral artery. Continue on plavix as per vascular surg. Needs outpatient f/u    DM2: HbA1c 7.8, poorly controlled. Continue on glargine, SSI w/ accuchecks     AKI on CKDIV: baseline Cr 2.3-2.4. Cr is trending up slightly from day prior  Leukocytosis: labile. Likely secondary to above infection   HTN: continue on hydralazine, coreg, nifedipine. Continue to hold ACE-I   Normocytic anemia: H&H are labile. No need for a transfusion     Hyperlipidemia: continue on statin    Obesity: BMI 29.9. Would benefit from weight loss    DVT prophylaxis: SCDs Code Status: full  Family Communication:  Disposition Plan: likely d/c back home  Level of care: Med-Surg  Status is: Inpatient  Remains inpatient appropriate because: medically stable for d/c but needs wound vac to go home with. Waiting on wound vac     Consultants:    Procedures:   Antimicrobials: unasyn    Subjective: Pt c/o fatigue   Objective: Vitals:   04/22/21 2051 04/23/21 0000 04/23/21 0444 04/23/21 0726  BP: (!) 169/62 (!) 168/58 (!) 170/63 (!) 168/72  Pulse: 75  73 72  Resp: 20  20   Temp: 98.4 F (36.9 C)  98 F (36.7 C) 98 F (36.7 C)  TempSrc:      SpO2: 96%  92% 96%  Weight:      Height:        Intake/Output Summary (Last 24 hours) at 04/23/2021 0739 Last data filed at 04/22/2021 1037 Gross per 24 hour  Intake 120 ml  Output --  Net 120 ml   Filed Weights   04/10/21 1242 04/12/21 1537 04/13/21 1040  Weight: 86.2 kg 86.2 kg 81.6 kg    Examination:  General exam: Appears comfortable  Respiratory system: clear breath sounds b/l  Cardiovascular system: S1/S2+. No rubs or clicks  Gastrointestinal system: Abd is soft, NT, obese & normal bowel sounds  Central nervous system: Alert and oriented. Moves all extremities  Psychiatry: judgement and insight appears normal. Appropriate mood and affect     Data Reviewed: I have personally  reviewed following labs and imaging studies  CBC: Recent Labs  Lab 04/19/21 0700 04/20/21 0645 04/21/21 0359 04/22/21 0352 04/23/21 0552  WBC 13.3* 11.2* 11.4* 12.6* 11.5*  HGB 10.0* 9.5* 9.6* 9.6* 10.0*  HCT 33.0* 31.4* 31.4* 32.4* 33.3*  MCV 86.4 87.0 88.7 88.5 89.0  PLT 330 326 318 322 101   Basic Metabolic Panel: Recent Labs  Lab 04/19/21 0700 04/20/21 0645 04/21/21 0359 04/22/21 0352 04/23/21 0552  NA 139 140 140 139 141  K 3.9 3.9 3.8 3.7 4.8  CL 106 108 108 110 107  CO2  25 24 24 24 25   GLUCOSE 158* 148* 167* 168* 130*  BUN 22 25* 25* 25* 28*  CREATININE 2.19* 2.05* 1.91* 1.78* 1.88*  CALCIUM 8.3* 8.5* 8.5* 8.5* 8.9   GFR: Estimated Creatinine Clearance: 33.1 mL/min (A) (by C-G formula based on SCr of 1.88 mg/dL (H)). Liver Function Tests: No results for input(s): AST, ALT, ALKPHOS, BILITOT, PROT, ALBUMIN in the last 168 hours. No results for input(s): LIPASE, AMYLASE in the last 168 hours. No results for input(s): AMMONIA in the last 168 hours. Coagulation Profile: No results for input(s): INR, PROTIME in the last 168 hours. Cardiac Enzymes: No results for input(s): CKTOTAL, CKMB, CKMBINDEX, TROPONINI in the last 168 hours. BNP (last 3 results) No results for input(s): PROBNP in the last 8760 hours. HbA1C: No results for input(s): HGBA1C in the last 72 hours. CBG: Recent Labs  Lab 04/21/21 2102 04/22/21 0809 04/22/21 1239 04/22/21 1705 04/22/21 2123  GLUCAP 196* 140* 294* 269* 171*   Lipid Profile: No results for input(s): CHOL, HDL, LDLCALC, TRIG, CHOLHDL, LDLDIRECT in the last 72 hours. Thyroid Function Tests: No results for input(s): TSH, T4TOTAL, FREET4, T3FREE, THYROIDAB in the last 72 hours. Anemia Panel: No results for input(s): VITAMINB12, FOLATE, FERRITIN, TIBC, IRON, RETICCTPCT in the last 72 hours. Sepsis Labs: No results for input(s): PROCALCITON, LATICACIDVEN in the last 168 hours.  Recent Results (from the past 240 hour(s))  Aerobic Culture w Gram Stain (superficial specimen)     Status: None   Collection Time: 04/18/21  3:01 PM   Specimen: Wound  Result Value Ref Range Status   Specimen Description   Final    WOUND Performed at [facility], [address], [place], [place] [address]    Special Requests   Final    NONE Performed at [facility], [address] [place], [place] [address]    Gram Stain   Final    NO SQUAMOUS EPITHELIAL CELLS SEEN MODERATE WBC SEEN NO ORGANISMS SEEN Performed  at [facility], [place] [address] [place], [place] [address]    Culture RARE STREPTOCOCCUS ANGINOSIS  Final   Report Status 04/22/2021 FINAL  Final   Organism ID, Bacteria STREPTOCOCCUS ANGINOSIS  Final      Susceptibility   Streptococcus anginosis - MIC*    PENICILLIN <=0.06 SENSITIVE Sensitive     CEFTRIAXONE <=0.12 SENSITIVE Sensitive     ERYTHROMYCIN <=0.12 SENSITIVE Sensitive     LEVOFLOXACIN 0.5 SENSITIVE Sensitive     VANCOMYCIN 0.5 SENSITIVE Sensitive     * RARE STREPTOCOCCUS ANGINOSIS         Radiology Studies: No results found.      Scheduled Meds:  carvedilol  25 mg Oral BID WC   clopidogrel  75 mg Oral Q breakfast   dapagliflozin propanediol  10 mg Oral Daily   digoxin  125 mcg Oral Daily   hydrALAZINE  100 mg  Oral Q8H   insulin aspart  0-15 Units Subcutaneous TID WC   insulin aspart  0-5 Units Subcutaneous QHS   insulin glargine-yfgn  25 Units Subcutaneous QHS   NIFEdipine  60 mg Oral Daily   rosuvastatin  10 mg Oral QHS   sodium chloride flush  3 mL Intravenous Q12H   Continuous Infusions:  sodium chloride 250 mL (04/21/21 2318)   ampicillin-sulbactam (UNASYN) IV 3 g (04/23/21 0552)     LOS: 12 days    Time spent: 15 mins     Wyvonnia Dusky, MD [facility] Pager [phone]  If 7PM-7AM, please contact night-coverage 04/23/2021, 7:39 AM

## 2021-04-23 NOTE — Progress Notes (Signed)
Pharmacy Antibiotic Note  Chelsea Barton is a 60 y.o. female admitted on 04/10/2021 with DFI. Pharmacy has been consulted for Unasyn dosing.  Presented with 2-day history of blister on left foot with swelling. PMH includes DM on insulin, HLD, PAD, neuropathy. Found to have gangrenous infection of left foot.   Xray of foot and history suggesting necrotizing soft tissue infection. On 10/25, received second toe amputation, then transmetatarsal amputation on 10/27. On 10/28 underwent angio and crosser atherectomy and stent placement. Patient reportedly has significant PAD, as poor perfusion noted in the areas of necrosis per podiatry.  As necrosis seems PAD related, will transition to Unasyn to cover organisms obtained in surgical culture.   Scr improving over last several days. CrCl remains > 30.  Plan:  Unasyn 3 g IV q6h (CrCl > 30 mL/min)  Monitor renal function, LOT and clinical course.    Height: 5\' 5"  (165.1 cm) Weight: 81.6 kg (180 lb) IBW/kg (Calculated) : 57  Temp (24hrs), Avg:98.1 F (36.7 C), Min:97.9 F (36.6 C), Max:98.4 F (36.9 C)  Recent Labs  Lab 04/19/21 0700 04/20/21 0645 04/21/21 0359 04/22/21 0352 04/23/21 0552  WBC 13.3* 11.2* 11.4* 12.6* 11.5*  CREATININE 2.19* 2.05* 1.91* 1.78* 1.88*     Estimated Creatinine Clearance: 33.1 mL/min (A) (by C-G formula based on SCr of 1.88 mg/dL (H)).    No Known Allergies  Antimicrobials this admission: 10/25 Zosyn >> 11/3 10/25 Linezolid >> 11/3 11/3 Unasyn >>   Microbiology results: 10/25 Surgical Cx: Prevotella disiens (beta lactamase +), Streptococcus anginosis, diphtheroids (Corynebacterium species) 10/25 BCx: NG 11/2 Superficial Cx: Streptococcus anginosis (S penicillin, ceftriaxone)   Thank you for allowing pharmacy to be a part of this patient's care.  Wynelle Cleveland, PharmD Pharmacy Resident  04/23/2021 11:59 AM

## 2021-04-23 NOTE — TOC Progression Note (Addendum)
Transition of Care [facility]) - Progression Note    Patient Details  Name: Chelsea Barton MRN: [id number] Date of Birth: [birth year]  [facility] CM/SW Contact  Conception Oms, RN Phone Number: 04/23/2021, 1:36 PM  Clinical Narrative:   Suanne Marker with [employer] called back and notified me that it could take 14 days to get Wound Vac approved by insurance, She stated may need to use another company, I reached out to [place] at 3 M, [employer] and requested a wound VAC ASAP, Awaiting a responce  Update, [employer] is processing and getting the escript orders from Dr Amalia Hailey, They are hoping to get delivered to the patient  here at the hospital today but could be tomorrow  Expected Discharge Plan: Home/Self Care Barriers to Discharge: Continued Medical Work up  Expected Discharge Plan and Services Expected Discharge Plan: Home/Self Care   Discharge Planning Services: CM Consult                     DME Arranged: Gilford Rile rolling DME Agency: [employer] Date DME Agency Contacted: 04/13/21 Time DME Agency Contacted: 74 Representative spoke with at DME Agency: [place] ([place]) Interventions    Readmission Risk Interventions No flowsheet data found.

## 2021-04-24 DIAGNOSIS — N184 Chronic kidney disease, stage 4 (severe): Secondary | ICD-10-CM

## 2021-04-24 DIAGNOSIS — E1069 Type 1 diabetes mellitus with other specified complication: Secondary | ICD-10-CM

## 2021-04-24 LAB — BASIC METABOLIC PANEL
Anion gap: 7 (ref 5–15)
BUN: 24 mg/dL — ABNORMAL HIGH (ref 8–23)
CO2: 27 mmol/L (ref 22–32)
Calcium: 8.7 mg/dL — ABNORMAL LOW (ref 8.9–10.3)
Chloride: 105 mmol/L (ref 98–111)
Creatinine, Ser: 1.8 mg/dL — ABNORMAL HIGH (ref 0.44–1.00)
GFR, Estimated: 32 mL/min — ABNORMAL LOW (ref >60)
Glucose, Bld: 206 mg/dL — ABNORMAL HIGH (ref 70–99)
Potassium: 4.1 mmol/L (ref 3.5–5.1)
Sodium: 139 mmol/L (ref 135–145)

## 2021-04-24 LAB — GLUCOSE, CAPILLARY
Glucose-Capillary: 217 mg/dL — ABNORMAL HIGH (ref 70–99)
Glucose-Capillary: 133 mg/dL — ABNORMAL HIGH (ref 70–99)
Glucose-Capillary: 216 mg/dL — ABNORMAL HIGH (ref 70–99)

## 2021-04-24 LAB — CBC
HCT: 32.5 % — ABNORMAL LOW (ref 36.0–46.0)
Hemoglobin: 9.7 g/dL — ABNORMAL LOW (ref 12.0–15.0)
MCH: 26.3 pg (ref 26.0–34.0)
MCHC: 29.8 g/dL — ABNORMAL LOW (ref 30.0–36.0)
MCV: 88.1 fL (ref 80.0–100.0)
Platelets: 341 10*3/uL (ref 150–400)
RBC: 3.69 MIL/uL — ABNORMAL LOW (ref 3.87–5.11)
RDW: 15.2 % (ref 11.5–15.5)
WBC: 8.5 10*3/uL (ref 4.0–10.5)
nRBC: 0 % (ref 0.0–0.2)

## 2021-04-24 MED ORDER — CLOPIDOGREL BISULFATE 75 MG PO TABS: 75.0000 mg | ORAL_TABLET | Freq: Every day | ORAL | 0 refills | Status: AC

## 2021-04-24 MED ORDER — AMOXICILLIN-POT CLAVULANATE 875-125 MG PO TABS: 1.0000 | ORAL_TABLET | Freq: Two times a day (BID) | ORAL | 0 refills | Status: DC

## 2021-04-24 MED ORDER — OXYCODONE HCL 5 MG PO TABS: 5.0000 mg | ORAL_TABLET | Freq: Four times a day (QID) | ORAL | 0 refills | Status: AC | PRN

## 2021-04-24 NOTE — Progress Notes (Signed)
Pt wound vac switched to her home [employer] vac.

## 2021-04-24 NOTE — Progress Notes (Signed)
Physical Therapy Treatment Patient Details Name: Chelsea Barton MRN: [id number] DOB: Nov 01, 1960 Today's Date: 04/24/2021   History of Present Illness Pt is a 60 y.o. 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 PMH includes HTN, CAD, DM, CKD IV, mitral valve insufficiency.    PT Comments    Pt received upright in bed agreeable to PT services. Remains supervision with good hand placement with standing to RW with maintaining NWB precautions on LLE.  Pt educated on and performed LL seated and standing therex for LLE strengthening in attempts to maintain strength In L sided limb during NWB precautions. Required mod VC's for form/technique with good carryover after cuing on form. Minguard provided for hop to gait pattern with RW. Half way down room pt requesting to sit on BSC to void BM. Supervision provided to sit on [place]. Pt indep with perihygiene and supervision to return to standing. Pt amb within room with minguard with x1 standing rest break then x1 seated rest break at EOB reporting RLE fatigue. After 2 min seated rest pt returned to recliner. Pt remains safe with use of RW and maintaining NWB precautions with all mobility to date.    Recommendations for follow up therapy are one component of a multi-disciplinary discharge planning process, led by the attending physician.  Recommendations may be updated based on patient status, additional functional criteria and insurance authorization.  Follow Up Recommendations  Follow physician's recommendations for discharge plan and follow up therapies     Assistance Recommended at Discharge PRN  Equipment Recommendations  None recommended by PT    Recommendations for Other Services       Precautions / Restrictions Precautions Precautions: Fall Required Braces or Orthoses: Other Brace Other Brace: Cam boot per MD(poditry) on 11/3 does not need to  wear CAM boot but does need to remain NWB Restrictions Weight Bearing Restrictions: Yes LLE Weight Bearing: Non weight bearing     Mobility  Bed Mobility Overal bed mobility: Modified Independent             General bed mobility comments: not assessed, pt in recliner at beginning/end of session Patient Response: Cooperative  Transfers Overall transfer level: Needs assistance Equipment used: Rolling walker (2 wheels) Transfers: Sit to/from Stand Sit to Stand: Supervision                Ambulation/Gait Ambulation/Gait assistance: Supervision Gait Distance (Feet): 25 Feet Assistive device: Rolling walker (2 wheels) Gait Pattern/deviations:  (hop to pattern)       General Gait Details: Safe ability to perform hop to gait pattern on RLE maintaining precautions on L foot. No balance or safety concerns noted.   Stairs             Wheelchair Mobility    Modified Rankin (Stroke Patients Only)       Balance Overall balance assessment: Needs assistance Sitting-balance support: No upper extremity supported;Feet supported Sitting balance-Leahy Scale: Good     Standing balance support: Bilateral upper extremity supported;Reliant on assistive device for balance Standing balance-Leahy Scale: Fair Standing balance comment: Requires MIN GUARD for functional mobility of short household distances with RW                            Cognition Arousal/Alertness: Awake/alert Behavior During Therapy: WFL for tasks assessed/performed Overall Cognitive Status: Within Functional Limits for tasks assessed  Exercises General Exercises - Lower Extremity Long Arc Quad: AROM;Seated;Left;10 reps Hip Flexion/Marching: Standing;Left;10 reps Other Exercises Other Exercises: hip extension in standing on L foot x10 with RW    General Comments        Pertinent Vitals/Pain Pain Assessment: No/denies pain     Home Living                          Prior Function            PT Goals (current goals can now be found in the care plan section) Acute Rehab PT Goals Patient Stated Goal: To go home PT Goal Formulation: With patient Time For Goal Achievement: 04/27/21 Potential to Achieve Goals: Good Progress towards PT goals: Progressing toward goals    Frequency    Min 2X/week      PT Plan Current plan remains appropriate    Co-evaluation              AM-PAC PT "6 Clicks" Mobility   Outcome Measure  Help needed turning from your back to your side while in a flat bed without using bedrails?: None Help needed moving from lying on your back to sitting on the side of a flat bed without using bedrails?: None Help needed moving to and from a bed to a chair (including a wheelchair)?: None Help needed standing up from a chair using your arms (e.g., wheelchair or bedside chair)?: None Help needed to walk in hospital room?: None Help needed climbing 3-5 steps with a railing? : A Lot 6 Click Score: 22    End of Session Equipment Utilized During Treatment: Gait belt Activity Tolerance: Patient tolerated treatment well Patient left: in bed;with bed alarm set;with call bell/phone within reach Nurse Communication: Mobility status PT Visit Diagnosis: Other abnormalities of gait and mobility (R26.89);Muscle weakness (generalized) (M62.81)     Time: 6468-0321 PT Time Calculation (min) (ACUTE ONLY): 28 min  Charges:  $Therapeutic Exercise: 23-37 mins                    Marshelle Bilger M. Fairly IV, PT, DPT Physical Therapist- [facility]  04/24/2021, 3:40 PM

## 2021-04-24 NOTE — Discharge Summary (Addendum)
Physician Discharge Summary  Chelsea Barton SWF:[id number] DOB: 12-18-1960 DOA: 04/10/2021  PCP: Burnard Hawthorne, FNP  Admit date: 04/10/2021 Discharge date: 04/24/2021  Admitted From: home  Disposition:  home   Recommendations for Outpatient Follow-up:  Follow up with PCP in 1-2 weeks F/u w/ podiatry, Dr. Sherryle Lis, in 1 week  F/u w/ ID, Dr. Delaine Lame, in 1-2 weeks F/u w/ vascular surg, Dr. Delana Meyer, in 1 month   Home Health: Equipment/Devices: wound vac   Discharge Condition: stable  CODE STATUS: full  Diet recommendation: Heart Healthy / Carb Modified  Brief/Interim Summary: HPI was taken from Dr. Tobie Poet: Chelsea Barton is a 60 y.o. female with medical history significant for CKD stage IV, primary hypertension, insulin-dependent diabetes mellitus Type I, left ventricular hypertrophy, hypertensive heart disease without heart failure, moderate mitral insufficiency, secondary hyperparathyroidism, proteinuria, hyperlipidemia,   She reports her foot started swelling about two days ago. She is not aware of any known trauma.  She denies awareness of any bug bites and/or prior wounds.  She reports this is never happened before.   She reports that two months ago, she went to podiatry and was told that something may have been there but didn't know what it was.    She denies any pain. She only came in because of blistering and swelling. She denies fever, chest pain, shortness of breath, nausea, vomiting, diarrhea, abdominal pain, dysuria, LOC, syncope.    She reports that she last ate PO evening of 04/09/21.    Social history: She lives with her sister. She denies tobacco use currently.. She quit 20-30 years. She denies etoh and recreatioanl drug use. 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    Vaccination history: She is vaccinated for covid 19, 5 shots of Moderna.   As per Dr. [place] Chelsea Barton is a 60 year old female with past medical history significant  for type 2 diabetes mellitus, CKD stage IV, essential hypertension, hyperlipidemia, LVH, mitral insufficiency, secondary hyperparathyroidism, hyperlipidemia who presented to [facility] ED on 10/25 with left foot pain and swelling.  Onset roughly 2 days ago.  Denies any trauma.    In the ED, temperature 98.4 F, HR 72, RR 18, BP 140/55, SPO2 97% on room air.  Sodium 138, potassium 4.1, chloride 102, CO2 26, glucose 213, BUN 40, creatinine 2.70.  Lactic acid 1.9.  WBC 26.1, hemoglobin 11.3, platelets 243.  INR 1.3.  COVID-19 PCR negative.  Influenza A/B PCR negative.  X-ray left foot with comminuted and displaced second proximal phalanx fracture, soft tissue emphysema involving the digits and forefoot around the fracture suggestive of necrotizing soft tissue infection.  Podiatry was consulted.  Patient was started empiric antibiotics.  TRH consulted for further evaluation and management of left foot diabetic infection.     As per Dr. Jimmye Norman 11/2-11/8/22: S/p debridement subcutaneous tissue & muscle in depth of amputation site necrosis & application of neg pressure wound therapy on 04/18/21 as per Dr. Sherryle Lis. Blood cxs NGTD. Operative culture grew prevotella disiens, strep angionosis & diptheroids. Pt was on IV unasyn while inpatient as per ID and then switched to po augmentin at d/c until 05/09/21 as per ID. Pt was d/c home w/ wound vac. For more information, please see previous progress/consult notes.   Discharge Diagnoses:  Principal Problem:   Necrotizing cellulitis Active Problems:   Hypertension   Hyperlipidemia   Obesity   CKD (chronic kidney disease)   CAD (coronary artery disease)   Mitral valve disease  Benign essential hypertension   Long-term insulin use (HCC)   Secondary hyperparathyroidism of renal origin ([place])   Proliferative diabetic retinopathy associated with type 1 diabetes mellitus ([place])   Osteomyelitis of foot, left, acute ([place])   Myonecrosis (HCC)  Left foot necrotizing/gas  gangrene diabetic infection: w/ osteomyelitis. S/p I&D and left second toe amputation by podiatry, Dr. Amalia Hailey on 04/10/2021. S/p transmetatarsal amputation by podiatry, Dr. Posey Pronto on 04/12/2021. S/p debridement subcutaneous tissue & muscle in depth of amputation site necrosis & application of neg pressure wound therapy on 04/18/21 as per Dr. Sherryle Lis. Blood cxs NGTD. Operative culture grew prevotella disiens, strep angionosis & diptheroids. IV unasyn was changed to po augmentin at d/c until 11/23 as per ID. Not weightbearing left lower extremity. Will need f/u w/ [facility] wound care center. Pt was d/c home w/ wound vac    PVD: ABI 10/26 with moderate to severe left lower extremity peripheral artery disease.  S/p angiogram by vascular surgery on 04/13/2021 with arthrectomy left SFA and angioplasty with stent placement left superficial femoral artery. Continue on plavix as per vascular surg. Needs outpatient f/u    DM1: HbA1c 7.8, poorly controlled. Continue on glargine, SSI w/ accuchecks     AKI on CKDIV: baseline Cr 2.3-2.4. Cr is labile   Leukocytosis: resolved   HTN: continue on hydralazine, coreg, nifedipine & ACE-I  Normocytic anemia: H&H are labile. No need for a transfusion    Hyperlipidemia: continue on statin    Obesity: BMI 29.9. Would benefit from weight loss   Discharge Instructions  Discharge Instructions     Diet - low sodium heart healthy   Complete by: As directed    Diet Carb Modified   Complete by: As directed    Discharge instructions   Complete by: As directed    F/u w/ podiatry, Dr. Sherryle Lis, in 1 week. F/u w/ ID, Dr. Delaine Lame, in 1-2 weeks. F/u w/ PCP in 1-2 weeks   Discharge instructions   Complete by: As directed    F/u w/ vascular surg, Dr. Delana Meyer, in 1 month   Increase activity slowly   Complete by: As directed    No wound care   Complete by: As directed    No wound care   Complete by: As directed       Allergies as of 04/24/2021   No Known  Allergies      Medication List     TAKE these medications    amoxicillin-clavulanate 875-125 MG tablet Commonly known as: Augmentin Take 1 tablet by mouth 2 (two) times daily for 15 days.   aspirin 81 MG EC tablet Take 1 tablet (81 mg total) by mouth daily. Swallow whole.   B-D ULTRAFINE III SHORT PEN 31G X 8 MM Misc Generic drug: Insulin Pen Needle USE AS DIRECTED   carvedilol 12.5 MG tablet Commonly known as: COREG Take 1 tablet (12.5 mg total) by mouth 2 (two) times daily with a meal. Take one tablet by mouth daily with a meal.   clopidogrel 75 MG tablet Commonly known as: PLAVIX Take 1 tablet (75 mg total) by mouth daily with breakfast. Start taking on: April 25, 2021   dapagliflozin propanediol 10 MG Tabs tablet Commonly known as: FARXIGA Take 10 mg by mouth daily.   Dexcom G6 Sensor Misc Place a new sensor every 10 days. Use to check blood sugar at least 4 times daily   Dexcom G6 Transmitter Misc Use to check glucose at least 4 times daily   digoxin  0.125 MG tablet Commonly known as: LANOXIN Take 1 tablet (125 mcg total) by mouth daily.   enalapril 20 MG tablet Commonly known as: VASOTEC Take 2 tablets (40 mg total) by mouth daily.   hydrALAZINE 10 MG tablet Commonly known as: APRESOLINE TAKE 1/2 TABLET BY MOUTH THREE TIMES DAILY AS NEEDED(TAKE IF BLOOD PRESSURE IS GREATER THAN 140/90)   insulin glargine 100 UNIT/ML Solostar Pen Commonly known as: LANTUS Inject 40 Units into the skin daily.   multivitamin capsule Take 1 capsule by mouth daily.   NIFEdipine 30 MG 24 hr tablet Commonly known as: PROCARDIA-XL/NIFEDICAL-XL Take 30 mg by mouth daily.   NovoLOG FlexPen 100 UNIT/ML FlexPen Generic drug: insulin aspart Inject 25 Units into the skin 3 (three) times daily with meals. Take 5 units at breakfast, 10 units at lunch and 10 units at dinner.   oxyCODONE 5 MG immediate release tablet Commonly known as: Oxy IR/ROXICODONE Take 1 tablet (5 mg  total) by mouth every 6 (six) hours as needed for up to 3 days for moderate pain or severe pain.   rosuvastatin 10 MG tablet Commonly known as: Crestor Take 1 tablet (10 mg total) by mouth daily. What changed: Another medication with the same name was removed. Continue taking this medication, and follow the directions you see here.   Vitamin D (Cholecalciferol) 10 MCG (400 UNIT) Caps Take 1 capsule by mouth daily.               Durable Medical Equipment  (From admission, onward)           Start     Ordered   04/13/21 1112  For home use only DME lightweight manual wheelchair with seat cushion  Once       Comments: Patient suffers from non weight bearing on left leg which impairs their ability to perform daily activities like ADLs  in the home.  A walking aide  will not resolve issue with performing activities of daily living. A wheelchair will allow patient to safely perform daily activities. Patient is not able to propel themselves in the home using a standard weight wheelchair due to weakness Patient can self propel in the lightweight wheelchair. Length of need 6 months. Accessories: left elevating leg rests ELRs, wheel locks, extensions and anti-tippers, back cushion.   04/13/21 1114   04/13/21 1025  For home use only DME Walker rolling  Once       Question Answer Comment  Walker: With [place]   Patient needs a walker to treat with the following condition Weakness      04/13/21 1024            Follow-up Information     Schnier, Dolores Lory, MD Follow up in 1 month(s).   Specialties: Vascular Surgery, Cardiology, Radiology, Vascular Surgery Why: Consult. Can see Civil engineer, contracting or Arna Medici. Will need ABI with visit. Contact information: [place] [address] [phone]         [name], DPM Follow up in 1 week(s).   Specialty: Podiatry Contact information: [place] [address] [phone]         Tsosie Billing, MD. Go on 05/03/2021.   Specialty: Infectious Diseases Why: Appt  @ 11:30 am Contact information: [place] [address] [phone]                No Known Allergies  Consultations: Podiatry ID Vascular surg    Procedures/Studies: MR FOOT  LEFT WO CONTRAST  Result Date: 04/11/2021 CLINICAL DATA:  Osteomyelitis suspected, foot swelling, diabetic EXAM: MRI OF THE LEFT FOOT WITHOUT CONTRAST TECHNIQUE: Multiplanar, multisequence MR imaging of the left foot was performed. No intravenous contrast was administered. COMPARISON:  Radiograph dated April 10, 2021 FINDINGS: Bones/Joint/Cartilage Status post amputation of the second digit through the metatarsophalangeal joint. There is hyperintense signal in the second metatarsal head on stir and T2 sequences (series 12, image 21, series 10 image 13). Ligaments Intact Muscles and Tendons Increased intramuscular signal of the plantar muscles concerning for diabetic myopathy/myositis. No drainable fluid collection or abscess. The visualized tendons are intact Soft tissues Marked subcutaneous soft tissue edema prominent about the dorsum of foot and about the prior amputation site. 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 Hyperintense signal in second metatarsal head, which in the presence of adjacent skin defect is concerning for osteomyelitis. Differential includes reactive marrow edema secondary to postsurgical changes. Marked soft tissue edema about the amputation site and dorsum of the foot. Diabetic myopathy/myositis of the plantar muscles. No drainable fluid collection or abscess. Electronically Signed   By: Keane Police D.O.   On: 04/11/2021 12:27   PERIPHERAL VASCULAR CATHETERIZATION  Result Date: 04/13/2021 See surgical note for result.  US ARTERIAL ABI (SCREENING LOWER EXTREMITY)  Result Date:  04/11/2021 CLINICAL DATA:  60 year old female with gangrenous left foot. EXAM: NONINVASIVE PHYSIOLOGIC VASCULAR STUDY OF BILATERAL LOWER EXTREMITIES TECHNIQUE: Evaluation of both lower extremities were performed at rest, including calculation of ankle-brachial indices with single level Doppler, pressure and pulse volume recording. COMPARISON:  None. FINDINGS: Right ABI:  0.90 Left ABI:  0.53 Right Lower Extremity: Biphasic waveforms in the posterior tibial artery, monophasic waveforms in the dorsalis pedis at the level of the ankle. Left Lower Extremity: Monophasic waveforms at the level of the ankle. IMPRESSION: 1. Moderate to severe left lower extremity peripheral artery disease (ABI = 0.53). 2. Mild right lower extremity peripheral artery disease (ABI = 0.90). Ruthann Cancer, MD [facility] Radiology Electronically Signed   By: Ruthann Cancer M.D.   On: 04/11/2021 12:12   DG Foot Complete Left  Result Date: 04/12/2021 CLINICAL DATA:  Postop EXAM: LEFT FOOT - COMPLETE 3+ VIEW COMPARISON:  04/10/2021 FINDINGS: Interval transmetatarsal amputation of the first through fifth digits. Gas in the soft tissues consistent with recent surgery. No fracture identified. IMPRESSION: Interval transmetatarsal amputation of the first through fifth digits with expected postsurgical change Electronically Signed   By: Donavan Foil M.D.   On: 04/12/2021 19:17   DG Foot Complete Left  Result Date: 04/10/2021 CLINICAL DATA:  Postop EXAM: LEFT FOOT - COMPLETE 3+ VIEW COMPARISON:  04/10/2021 FINDINGS: Interval amputation of the second digit at the level of the MTP joint. No acute fracture or malalignment. Resolution of previously noted soft tissue emphysema. IMPRESSION: Interval amputation of the second digit at the level of MTP joint with expected postsurgical change Electronically Signed   By: Donavan Foil M.D.   On: 04/10/2021 21:38   DG Foot Complete Left  Result Date:  04/10/2021 CLINICAL DATA:  Rule out osteomyelitis. EXAM: LEFT FOOT - COMPLETE 3+ VIEW COMPARISON:  None. FINDINGS: Comminuted fracture involving the second proximal phalanx, both base and shaft. Extensive regional soft tissue gas tracking along the plantar forefoot. No adjacent erosion. Generalized osteopenia. These results will be called to the ordering clinician or representative by the Radiologist Assistant, and  communication documented in the PACS or [employer]. IMPRESSION: 1. Comminuted and displaced second proximal phalanx fracture. 2. Soft tissue emphysema involving the digits and forefoot around the fracture, history suggesting necrotizing soft tissue infection. Electronically Signed   By: Jorje Guild M.D.   On: 04/10/2021 11:52   (Echo, Carotid, EGD, Colonoscopy, ERCP)    Subjective: Pt denies any complaints and wants to go home today    Discharge Exam: Vitals:   04/24/21 0730 04/24/21 1526  BP: (!) 159/62 (!) 159/60  Pulse: 73 72  Resp: 18 18  Temp: 98.4 F (36.9 C) 97.9 F (36.6 C)  SpO2: 95% 96%   Vitals:   04/23/21 2311 04/24/21 0454 04/24/21 0730 04/24/21 1526  BP: (!) 167/72 (!) 160/59 (!) 159/62 (!) 159/60  Pulse: 74 80 73 72  Resp: 17 17 18 18   Temp: 98.4 F (36.9 C) 98.6 F (37 C) 98.4 F (36.9 C) 97.9 F (36.6 C)  TempSrc:      SpO2: 99% 100% 95% 96%  Weight:      Height:        General: Pt is alert, awake, not in acute distress Cardiovascular: S1/S2 +, no rubs, no gallops Respiratory: CTA bilaterally, no wheezing, no rhonchi Abdominal: Soft, NT, ND, bowel sounds + Extremities: no cyanosis    The results of significant diagnostics from this hospitalization (including imaging, microbiology, ancillary and laboratory) are listed below for reference.     Microbiology: Recent Results (from the past 240 hour(s))  Aerobic Culture w Gram Stain (superficial specimen)     Status: None   Collection Time: 04/18/21  3:01 PM   Specimen: Wound  Result  Value Ref Range Status   Specimen Description   Final    WOUND Performed at [facility], [address] [place], [place] [address]    Special Requests   Final    NONE Performed at [facility], [address] [place], [place] [address]    Gram Stain   Final    NO SQUAMOUS EPITHELIAL CELLS SEEN MODERATE WBC SEEN NO ORGANISMS SEEN Performed at [facility], [place] [address], [place], [place] [address]    Culture RARE STREPTOCOCCUS ANGINOSIS  Final   Report Status 04/22/2021 FINAL  Final   Organism ID, Bacteria STREPTOCOCCUS ANGINOSIS  Final      Susceptibility   Streptococcus anginosis - MIC*    PENICILLIN <=0.06 SENSITIVE Sensitive     CEFTRIAXONE <=0.12 SENSITIVE Sensitive     ERYTHROMYCIN <=0.12 SENSITIVE Sensitive     LEVOFLOXACIN 0.5 SENSITIVE Sensitive     VANCOMYCIN 0.5 SENSITIVE Sensitive     * RARE STREPTOCOCCUS ANGINOSIS     Labs: BNP (last 3 results) No results for input(s): BNP in the last 8760 hours. Basic Metabolic Panel: Recent Labs  Lab 04/20/21 0645 04/21/21 0359 04/22/21 0352 04/23/21 0552 04/24/21 0654  NA 140 140 139 141 139  K 3.9 3.8 3.7 4.8 4.1  CL 108 108 110 107 105  CO2 24 24 24 25 27   GLUCOSE 148* 167* 168* 130* 206*  BUN 25* 25* 25* 28* 24*  CREATININE 2.05* 1.91* 1.78* 1.88* 1.80*  CALCIUM 8.5* 8.5* 8.5* 8.9 8.7*   Liver Function Tests: No results for input(s): AST, ALT, ALKPHOS, BILITOT, PROT, ALBUMIN in the last 168 hours. No results for input(s): LIPASE, AMYLASE in the last 168 hours. No results for input(s): AMMONIA in the last 168 hours. CBC: Recent Labs  Lab 04/20/21 0645 04/21/21 0359 04/22/21  2947 04/23/21 0552 04/24/21 0654  WBC 11.2* 11.4* 12.6* 11.5* 8.5  HGB 9.5* 9.6* 9.6* 10.0* 9.7*  HCT 31.4* 31.4* 32.4* 33.3* 32.5*  MCV 87.0 88.7 88.5 89.0 88.1  PLT 326 318 322 358 341   Cardiac Enzymes: No results for input(s): CKTOTAL, CKMB, CKMBINDEX, TROPONINI in the last 168  hours. BNP: Invalid input(s): POCBNP CBG: Recent Labs  Lab 04/23/21 1710 04/23/21 2058 04/24/21 0824 04/24/21 1156 04/24/21 1532  GLUCAP 250* 159* 217* 133* 216*   D-Dimer No results for input(s): DDIMER in the last 72 hours. Hgb A1c No results for input(s): HGBA1C in the last 72 hours. Lipid Profile No results for input(s): CHOL, HDL, LDLCALC, TRIG, CHOLHDL, LDLDIRECT in the last 72 hours. Thyroid function studies No results for input(s): TSH, T4TOTAL, T3FREE, THYROIDAB in the last 72 hours.  Invalid input(s): FREET3 Anemia work up No results for input(s): VITAMINB12, FOLATE, FERRITIN, TIBC, IRON, RETICCTPCT in the last 72 hours. Urinalysis No results found for: COLORURINE, APPEARANCEUR, [place], [place], [place], [place], [employer], [place], [place], UROBILINOGEN, NITRITE, LEUKOCYTESUR Sepsis Labs Invalid input(s): PROCALCITONIN,  WBC,  LACTICIDVEN Microbiology Recent Results (from the past 240 hour(s))  Aerobic Culture w Gram Stain (superficial specimen)     Status: None   Collection Time: 04/18/21  3:01 PM   Specimen: Wound  Result Value Ref Range Status   Specimen Description   Final    WOUND Performed at [facility], [address] [place], [place] [address]    Special Requests   Final    NONE Performed at [facility], [address] [place], [place] [address]    Gram Stain   Final    NO SQUAMOUS EPITHELIAL CELLS SEEN MODERATE WBC SEEN NO ORGANISMS SEEN Performed at [facility], [place] [address], [place], [place] [address]    Culture RARE STREPTOCOCCUS ANGINOSIS  Final   Report Status 04/22/2021 FINAL  Final   Organism ID, Bacteria STREPTOCOCCUS ANGINOSIS  Final      Susceptibility   Streptococcus anginosis - MIC*    PENICILLIN <=0.06 SENSITIVE Sensitive     CEFTRIAXONE <=0.12 SENSITIVE Sensitive     ERYTHROMYCIN <=0.12 SENSITIVE Sensitive     LEVOFLOXACIN 0.5 SENSITIVE Sensitive     VANCOMYCIN 0.5 SENSITIVE Sensitive      * RARE STREPTOCOCCUS ANGINOSIS     Time coordinating discharge: Over 30 minutes  SIGNED:   Wyvonnia Dusky, MD  [facility] 04/24/2021, 3:54 PM Pager   If 7PM-7AM, please contact night-coverage

## 2021-04-24 NOTE — TOC Progression Note (Signed)
Transition of Care [facility]) - Progression Note    Patient Details  Name: Chelsea Barton MRN: [id number] Date of Birth: [birth year]  Transition of Care [facility]) CM/SW [place], RN Phone Number: 04/24/2021, 9:47 AM  Clinical Narrative:   Spoke with the Rep from [facility] and she stated that they can Deliver the Wound Vac today once Dr Ellard Artis signs the [place]. I notified Dr Ellard Artis of the need, awaiting Delivery of the wound vac to be able to DC    Expected Discharge Plan: Home/Self Care Barriers to Discharge: Continued Medical Work up  Expected Discharge Plan and Services Expected Discharge Plan: Home/Self Care   Discharge Planning Services: CM Consult                     DME Arranged: Gilford Rile rolling DME Agency: [employer] Date DME Agency Contacted: 04/13/21 Time DME Agency Contacted: 7 Representative spoke with at DME Agency: [place] ([place]) Interventions    Readmission Risk Interventions No flowsheet data found.

## 2021-04-24 NOTE — Plan of Care (Signed)
  Problem: Education: Goal: Knowledge of General Education information will improve Description: Including pain rating scale, medication(s)/side effects and non-pharmacologic comfort measures Outcome: Progressing   Problem: Health Behavior/Discharge Planning: Goal: Ability to manage health-related needs will improve Outcome: Progressing   Problem: Clinical Measurements: Goal: Ability to maintain clinical measurements within normal limits will improve Outcome: Progressing Goal: Will remain free from infection Outcome: Progressing Goal: Diagnostic test results will improve Outcome: Progressing Goal: Respiratory complications will improve Outcome: Progressing Goal: Cardiovascular complication will be avoided Outcome: Progressing   Problem: Coping: Goal: Level of anxiety will decrease Outcome: Progressing   Problem: Elimination: Goal: Will not experience complications related to bowel motility Outcome: Progressing Goal: Will not experience complications related to urinary retention Outcome: Progressing   Problem: Pain Managment: Goal: General experience of comfort will improve Outcome: Progressing   Problem: Safety: Goal: Ability to remain free from injury will improve Outcome: Progressing   

## 2021-04-25 ENCOUNTER — Telehealth: Payer: Self-pay

## 2021-04-25 NOTE — Telephone Encounter (Signed)
Transition Care Management Follow-up Telephone Call Date of discharge and from where: 04/24/2021 from [facility] How have you been since you were released from the hospital? Pt stated that she is feeling well and did not have any questions or concerns at this time.  Any questions or concerns? No  Items Reviewed: Did the pt receive and understand the discharge instructions provided? Yes  Medications obtained and verified? Yes  Other? No  Any new allergies since your discharge? No  Dietary orders reviewed? No Do you have support at home? Yes   Functional Questionnaire: (I = Independent and D = Dependent) ADLs: I  Bathing/Dressing- I  Meal Prep- I  Eating- I  Maintaining continence- I  Transferring/Ambulation- I  Managing Meds- I   Follow up appointments reviewed:  PCP Hospital f/u appt confirmed? No   Specialist Hospital f/u appt confirmed? Yes  Scheduled to see Wound Care on 04/27/2021 @ 10:15am. Are transportation arrangements needed? No  If their condition worsens, is the pt aware to call PCP or go to the Emergency Dept.? Yes Was the patient provided with contact information for the PCP's office or ED? Yes Was to pt encouraged to call back with questions or concerns? Yes

## 2021-04-27 ENCOUNTER — Ambulatory Visit: Payer: Medicaid Other | Admitting: Physician Assistant

## 2021-04-27 ENCOUNTER — Telehealth: Payer: Self-pay | Admitting: *Deleted

## 2021-04-27 DIAGNOSIS — L97524 Non-pressure chronic ulcer of other part of left foot with necrosis of bone: Secondary | ICD-10-CM

## 2021-04-27 NOTE — Addendum Note (Signed)
Addended bySherryle Lis, Kristena Wilhelmi R on: 04/27/2021 07:21 PM   Modules accepted: Orders

## 2021-04-27 NOTE — Telephone Encounter (Signed)
"  I need someone to come out here and change this wound.  I couldn't go to whats a name because they wouldn't see me.  They said I need a referral from the doctor. "

## 2021-04-27 NOTE — Telephone Encounter (Signed)
I'm returning your call.  How can we help you?  "I went over to the wound care center and they wouldn't see me.  They said I needed a referral.  I haven't had my dressing changed since I left the hospital."  I'll let Dr. Sherryle Lis know.

## 2021-04-27 NOTE — Telephone Encounter (Signed)
I spoke with her and that was supposed to be set up at discharge by the transition care team but clearly was not. I told her to remove the VAC dressing and do saline wet to  dry dressings until she sees me. Delydia can you contact [facility]  [phone] to initiate a home nurse? I've attached an order to this encounter

## 2021-04-30 ENCOUNTER — Telehealth: Payer: Self-pay | Admitting: *Deleted

## 2021-04-30 NOTE — Telephone Encounter (Signed)
"  The doctor said someone was supposed to come see me today to do my wounds.  No one has showed up.  Can you all please give me a call back?"

## 2021-05-01 NOTE — Telephone Encounter (Signed)
I called [facility] to arrange for a home health nurse.  They asked me to fax the referral to them at [phone].

## 2021-05-01 NOTE — Telephone Encounter (Signed)
I called Chelsea Barton to see if the home health nurse had came out.  She stated they had not.  I told her that I was processing it now.  I informed her that Dr. Sherryle Lis had put the order in about four days ago.    I faxed the order to [facility].   Nira Conn, please follow-up.)

## 2021-05-02 ENCOUNTER — Telehealth: Payer: Self-pay

## 2021-05-02 ENCOUNTER — Telehealth: Payer: Self-pay | Admitting: *Deleted

## 2021-05-02 ENCOUNTER — Other Ambulatory Visit: Payer: Self-pay

## 2021-05-02 ENCOUNTER — Ambulatory Visit (INDEPENDENT_AMBULATORY_CARE_PROVIDER_SITE_OTHER): Payer: Medicaid Other | Admitting: Podiatry

## 2021-05-02 DIAGNOSIS — L97525 Non-pressure chronic ulcer of other part of left foot with muscle involvement without evidence of necrosis: Secondary | ICD-10-CM

## 2021-05-02 MED ORDER — MUPIROCIN 2 % EX OINT: 1.0000 "application " | TOPICAL_OINTMENT | Freq: Two times a day (BID) | CUTANEOUS | 2 refills | Status: DC

## 2021-05-02 NOTE — Telephone Encounter (Signed)
I called and spoke with Crystal at [facility] about this patient's referral to home health. I was informed that the referral was rejected due to the patient being out of network. I spoke with the patient and advised. She is going to switch her insurance back to [employer]/Medicare/Medicaid. I gave her supplies for dressing changes to last several days. Advised to call as soon as her coverage is switched. Also advised that if she has any trouble switching back, to call Medicaid and find out which home health is in network.

## 2021-05-02 NOTE — Progress Notes (Signed)
  Subjective:  Patient ID: Chelsea Barton, female    DOB: 02/23/1961,  MRN: [id number]  Chief Complaint  Patient presents with   Diabetic Ulcer    Follow up right foot    60 y.o. female presents with the above complaint. History confirmed with patient.  She returns for postop follow-up from the hospital from left transmetatarsal amputation and subsequent debridement.  Wound VAC had to be removed because home nursing was not set up.  Overall feels well denies fevers chills nausea vomiting.  Date of surgery 04/18/2021  Objective:  Physical Exam: Skin flaps warm and well perfused.  Open ulceration with fiber granular wound bed measuring 7.5 x 8.5 x 1.5 cm left foot, exposed fibrous tendinous structures, no purulence no exposed bone no malodor     Assessment:   1. Ulcer of left foot with muscle involvement without evidence of necrosis [facility])      Plan:  Patient was evaluated and treated and all questions answered.  Ulcer left foot -We discussed the etiology and factors that are a part of the wound healing process.  We also discussed the risk of infection both soft tissue and osteomyelitis from open ulceration.  Discussed the risk of limb loss if this happens or worsens. -Debridement as below. -Dressed with Iodosorb, DSD. -Continue off-loading with surgical shoe. -Rx for mupirocin sent to pharmacy. -If other day dressing changes with this. -Unfortunate she is out of network with our office, we will continue to see her for postop visits but she is also out of network for the home nursing care visits.  She is going to see if she can switch back to [place] [facility] which she was previously in network for this.  Hopefully can get her set up with home care again for wound vacs, and tissue grafting in the office if she is able to do this.  May have to refer out if not possible  Procedure: Excisional Debridement of Wound Rationale: Removal of non-viable soft tissue from the wound to promote  healing.  Anesthesia: none Post-Debridement Wound Measurements: 7.5 cm x 8.5 cm x 1.5 cm  Type of Debridement: Sharp Excisional Tissue Removed: Non-viable soft tissue Depth of Debridement: subcutaneous tissue. Technique: Sharp excisional debridement to bleeding, viable wound base.  Dressing: Dry, sterile, compression dressing. Disposition: Patient tolerated procedure well. Patient to return in 2 week for follow-up.No follow-ups on file.

## 2021-05-02 NOTE — Telephone Encounter (Signed)
Patient called and said she did or is switching back over to [facility]

## 2021-05-03 ENCOUNTER — Encounter: Payer: Self-pay | Admitting: Infectious Diseases

## 2021-05-03 ENCOUNTER — Ambulatory Visit: Payer: Medicaid Other | Attending: Infectious Diseases | Admitting: Infectious Diseases

## 2021-05-03 VITALS — BP 167/79 | HR 69 | Resp 16 | Ht 65.0 in | Wt 180.0 lb

## 2021-05-03 DIAGNOSIS — E1122 Type 2 diabetes mellitus with diabetic chronic kidney disease: Secondary | ICD-10-CM | POA: Diagnosis not present

## 2021-05-03 DIAGNOSIS — Z7902 Long term (current) use of antithrombotics/antiplatelets: Secondary | ICD-10-CM | POA: Insufficient documentation

## 2021-05-03 DIAGNOSIS — L0889 Other specified local infections of the skin and subcutaneous tissue: Secondary | ICD-10-CM | POA: Insufficient documentation

## 2021-05-03 DIAGNOSIS — Z87891 Personal history of nicotine dependence: Secondary | ICD-10-CM | POA: Diagnosis not present

## 2021-05-03 DIAGNOSIS — N189 Chronic kidney disease, unspecified: Secondary | ICD-10-CM | POA: Diagnosis not present

## 2021-05-03 DIAGNOSIS — L089 Local infection of the skin and subcutaneous tissue, unspecified: Secondary | ICD-10-CM

## 2021-05-03 DIAGNOSIS — Z09 Encounter for follow-up examination after completed treatment for conditions other than malignant neoplasm: Secondary | ICD-10-CM | POA: Insufficient documentation

## 2021-05-03 DIAGNOSIS — E1140 Type 2 diabetes mellitus with diabetic neuropathy, unspecified: Secondary | ICD-10-CM | POA: Insufficient documentation

## 2021-05-03 DIAGNOSIS — E1152 Type 2 diabetes mellitus with diabetic peripheral angiopathy with gangrene: Secondary | ICD-10-CM | POA: Insufficient documentation

## 2021-05-03 DIAGNOSIS — E11628 Type 2 diabetes mellitus with other skin complications: Secondary | ICD-10-CM

## 2021-05-03 DIAGNOSIS — Z794 Long term (current) use of insulin: Secondary | ICD-10-CM | POA: Diagnosis not present

## 2021-05-03 DIAGNOSIS — Z7901 Long term (current) use of anticoagulants: Secondary | ICD-10-CM | POA: Insufficient documentation

## 2021-05-03 DIAGNOSIS — I129 Hypertensive chronic kidney disease with stage 1 through stage 4 chronic kidney disease, or unspecified chronic kidney disease: Secondary | ICD-10-CM | POA: Insufficient documentation

## 2021-05-03 MED ORDER — AMOXICILLIN-POT CLAVULANATE 875-125 MG PO TABS: 1.0000 | ORAL_TABLET | Freq: Two times a day (BID) | ORAL | 0 refills | Status: AC

## 2021-05-03 NOTE — Patient Instructions (Addendum)
You are here for follow up of left foot infection- you had TMA . You went home with wound vac- but it has not been changed since it got removed. The wound looks good- will continue augmentin for 4 more weeks- prescription to your pharmacy

## 2021-05-03 NOTE — Progress Notes (Signed)
NAMEElanna Bert Barton  DOB: April 09, 1961  MRN: [id number]  Date/Time: 05/03/2021 11:11 AM  Subjective:   ? Chelsea Barton is a 60 y.o. with a history of Diabetes mellitus,CKD, primary hypertension, is here for follow-up after recent hospitalization.  Patient was admitted on 04/10/2021 to [facility] with left foot infection and had to undergo TMA.  She initially had second toe amputation but the gangrene incision was progressing.  She underwent TMA on 04/12/2021.  She underwent angio and had a culture atherectomy and stent placement in the left SFA on 04/13/2021 .the proximal margin of the wound was free of osteomyelitis. The culture from the left foot was positive for Prevotella, Streptococcus anginosus.  After getting IV antibiotics in the hospital she was discharged on 04/24/2021 on p.o. Augmentin and also a wound VAC.  But as per patient she states the wound VAC was removed the other day but she has not cardiac replacement.  She saw the podiatrist yesterday and he debrided the wound.  She is now getting wet-to-dry dressing., She says she is doing very well No fever.  She does her own dressings No diarrhea no rash no abdominal pain.  Some Past Medical History:  Diagnosis Date   Diabetes mellitus    Hyperlipidemia    Hypertension     Past Surgical History:  Procedure Laterality Date   AMPUTATION TOE  04/10/2021   Procedure: AMPUTATION 2nd TOE, left foot;  Surgeon: Edrick Kins, DPM;  Location: [facility] ORS;  Service: Podiatry;;   CATARACT EXTRACTION     eye surgery r Right    lense placed   INCISION AND DRAINAGE Left 04/18/2021   Procedure: INCISION AND DRAINAGE;  Surgeon: [name], DPM;  Location: [facility] ORS;  Service: Podiatry;  Laterality: Left;   INCISION AND DRAINAGE OF WOUND Left 04/10/2021   Procedure: IRRIGATION AND DEBRIDEMENT WOUND;  Surgeon: Edrick Kins, DPM;  Location: [facility] ORS;  Service: Podiatry;  Laterality: Left;   PERIPHERAL VASCULAR BALLOON ANGIOPLASTY Left 04/13/2021    Procedure: PERIPHERAL VASCULAR BALLOON ANGIOPLASTY;  Surgeon: Katha Cabal, MD;  Location: [place] [facility];  Service: Cardiovascular;  Laterality: Left;   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   Procedure: TRANSMETATARSAL AMPUTATION;  Surgeon: Felipa Furnace, DPM;  Location: [facility] ORS;  Service: Podiatry;  Laterality: Left;    Social History   Socioeconomic History   Marital status: Single    Spouse name: Not on file   Number of children: Not on file   Years of education: Not on file   Highest education level: Not on file  Occupational History   Not on file  Tobacco Use   Smoking status: Former    Types: Cigarettes    Quit date: 06/18/2005    Years since quitting: 15.8   Smokeless tobacco: Never  Vaping Use   Vaping Use: Never used  Substance and Sexual Activity   Alcohol use: No   Drug use: No   Sexual activity: Not on file  Other Topics Concern   Not on file  Social History Narrative   Has a great niece that she keeps a lot, 5 yo, 'Chelsea Barton'.  Stays weekends with her.       Has 8 great nieces, 3 nieces, 6 nephews   Social Determinants of Radio broadcast assistant Strain: Medium Risk   Difficulty of Paying Living Expenses: Somewhat hard  Food Insecurity: Not on file  Transportation Needs: Not on file  Physical Activity: Not on file  Stress: Not on file  Social Connections: Not on file  Intimate Partner Violence: Not on file    Family History  Problem Relation Age of Onset   Diabetes Sister        3/5 sisters have DM   Breast cancer Neg Hx    Colon cancer Neg Hx    No Known Allergies I? Current Outpatient Medications  Medication Sig Dispense Refill   amoxicillin-clavulanate (AUGMENTIN) 875-125 MG tablet Take 1 tablet by mouth 2 (two) times daily for 15 days. 28 tablet 0   aspirin 81 MG EC tablet Take 1 tablet (81 mg total) by mouth daily. Swallow whole. 30 tablet 12   carvedilol (COREG) 12.5 MG tablet Take 1 tablet (12.5 mg total) by mouth 2 (two)  times daily with a meal. Take one tablet by mouth daily with a meal. 180 tablet 1   clopidogrel (PLAVIX) 75 MG tablet Take 1 tablet (75 mg total) by mouth daily with breakfast. 30 tablet 0   Continuous Blood Gluc Sensor (DEXCOM G6 SENSOR) MISC Place a new sensor every 10 days. Use to check blood sugar at least 4 times daily 9 each 3   Continuous Blood Gluc Transmit (DEXCOM G6 TRANSMITTER) MISC Use to check glucose at least 4 times daily 1 each 3   dapagliflozin propanediol (FARXIGA) 10 MG TABS tablet Take 10 mg by mouth daily.     digoxin (LANOXIN) 0.125 MG tablet Take 1 tablet (125 mcg total) by mouth daily. 90 tablet 1   enalapril (VASOTEC) 20 MG tablet Take 2 tablets (40 mg total) by mouth daily. 180 tablet 3   hydrALAZINE (APRESOLINE) 10 MG tablet TAKE 1/2 TABLET BY MOUTH THREE TIMES DAILY AS NEEDED(TAKE IF BLOOD PRESSURE IS GREATER THAN 140/90) 30 tablet 1   insulin glargine (LANTUS) 100 UNIT/ML Solostar Pen Inject 40 Units into the skin daily.      Insulin Pen Needle (B-D ULTRAFINE III SHORT PEN) 31G X 8 MM MISC USE AS DIRECTED 100 each 6   Multiple Vitamin (MULTIVITAMIN) capsule Take 1 capsule by mouth daily.     mupirocin ointment (BACTROBAN) 2 % Apply 1 application topically 2 (two) times daily. 30 g 2   NIFEdipine (PROCARDIA-XL/NIFEDICAL-XL) 30 MG 24 hr tablet Take 30 mg by mouth daily.     NOVOLOG FLEXPEN 100 UNIT/ML FlexPen Inject 25 Units into the skin 3 (three) times daily with meals. Take 5 units at breakfast, 10 units at lunch and 10 units at dinner.     rosuvastatin (CRESTOR) 10 MG tablet Take 1 tablet (10 mg total) by mouth daily. 90 tablet 3   Vitamin D, Cholecalciferol, 10 MCG (400 UNIT) CAPS Take 1 capsule by mouth daily.     No current facility-administered medications for this visit.     Abtx:  Anti-infectives (From admission, onward)    None       REVIEW OF SYSTEMS:  Const: negative fever, negative chills, negative weight loss Eyes: negative diplopia or visual  changes, negative eye pain ENT: negative coryza, negative sore throat Resp: negative cough, hemoptysis, dyspnea Cards: negative for chest pain, palpitations, lower extremity edema GU: negative for frequency, dysuria and hematuria GI: Negative for abdominal pain, diarrhea, bleeding, constipation Skin: negative for rash and pruritus Heme: negative for easy bruising and gum/nose bleeding MS: Weakness. Neurolo:negative for headaches, dizziness, vertigo, memory problems  Psych: negative for feelings of anxiety, depression  Endocrine:, diabetes Allergy/Immunology- negative for any medication or food allergies ? Objective:  VITALS: BP (!) 167/79  Pulse 69   Resp 16   Ht 5\' 5"  (1.651 m)   Wt 180 lb (81.6 kg)   SpO2 94%   BMI 29.95 kg/m    PHYSICAL EXAM:  General: Alert, cooperative, no distress, appears stated age.  Head: Normocephalic, without obvious abnormality, atraumatic. Eyes: Conjunctivae clear, anicteric sclerae. Pupils are equal ENT Nares normal. No drainage or sinus tenderness. Lips, mucosa, and tongue normal. No Thrush Neck: Supple, symmetrical, no adenopathy, thyroid: non tender no carotid bruit and no JVD. Back: No CVA tenderness. Lungs: Clear to auscultation bilaterally. No Wheezing or Rhonchi. No rales. Heart: Regular rate and rhythm, no murmur, rub or gallop. Abdomen: Soft, non-tender,not distended. Bowel sounds normal. No masses Extremities:         Skin: No rashes or lesions. Or bruising Lymph: Cervical, supraclavicular normal. Neurologic: Grossly non-focal Pertinent Labs Lab Results CBC    Component Value Date/Time   WBC 8.5 04/24/2021 0654   RBC 3.69 (L) 04/24/2021 0654   HGB 9.7 (L) 04/24/2021 0654   HCT 32.5 (L) 04/24/2021 0654   PLT 341 04/24/2021 0654   MCV 88.1 04/24/2021 0654   MCH 26.3 04/24/2021 0654   MCHC 29.8 (L) 04/24/2021 0654   RDW 15.2 04/24/2021 0654   LYMPHSABS 1.1 04/10/2021 1357   MONOABS 2.3 (H) 04/10/2021 1357   EOSABS  0.1 04/10/2021 1357   BASOSABS 0.1 04/10/2021 1357    CMP Latest Ref Rng & Units 04/24/2021 04/23/2021 04/22/2021  Glucose 70 - 99 mg/dL 206(H) 130(H) 168(H)  BUN 8 - 23 mg/dL 24(H) 28(H) 25(H)  Creatinine 0.44 - 1.00 mg/dL 1.80(H) 1.88(H) 1.78(H)  Sodium 135 - 145 mmol/L 139 141 139  Potassium 3.5 - 5.1 mmol/L 4.1 4.8 3.7  Chloride 98 - 111 mmol/L 105 107 110  CO2 22 - 32 mmol/L 27 25 24   Calcium 8.9 - 10.3 mg/dL 8.7(L) 8.9 8.5(L)  Total Protein 6.5 - 8.1 g/dL - - -  Total Bilirubin 0.3 - 1.2 mg/dL - - -  Alkaline Phos 38 - 126 U/L - - -  AST 15 - 41 U/L - - -  ALT 0 - 44 U/L - - -    ? 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  Initially had second toe amputation followed by transmetatarsal amputation.  This is complicated by PAD and neuropathy.  She initially got Unasyn and then was switched over to p.o. Augmentin for a total of 4 weeks on and she will completed on 05/09/2021, going to extend the Augmentin until 05/16/2021 and I will see her on 05/17/2021. She needs a wound VAC as the amputation site has a open wound.  Currently doing the dressing.  Followed by podiatrist.  Peripheral arterial disease.  crosser atherectomy and stent placement in left SFA on 04/13/2021 On Plavix ? Diabetes mellitus on insulin and Farxiga. Hypertension on amlodipine Discussed with patient in great detail. Follow-up 05/17/2021.   Note:  This document was prepared using Dragon voice recognition software and may include unintentional dictation errors.

## 2021-05-07 NOTE — Telephone Encounter (Signed)
Patient called on Friday and stated she switched back to [facility]. The [facility] wont start till the 1st of the month.

## 2021-05-08 ENCOUNTER — Telehealth: Payer: Self-pay

## 2021-05-08 ENCOUNTER — Telehealth: Payer: Self-pay | Admitting: Family

## 2021-05-08 ENCOUNTER — Encounter: Payer: Self-pay | Admitting: Family

## 2021-05-08 ENCOUNTER — Ambulatory Visit (INDEPENDENT_AMBULATORY_CARE_PROVIDER_SITE_OTHER): Payer: Medicaid Other | Admitting: Family

## 2021-05-08 ENCOUNTER — Other Ambulatory Visit: Payer: Self-pay

## 2021-05-08 ENCOUNTER — Ambulatory Visit (INDEPENDENT_AMBULATORY_CARE_PROVIDER_SITE_OTHER): Payer: Medicaid Other

## 2021-05-08 VITALS — BP 140/70 | HR 65 | Temp 98.0°F | Wt 191.4 lb

## 2021-05-08 DIAGNOSIS — Z89432 Acquired absence of left foot: Secondary | ICD-10-CM | POA: Diagnosis not present

## 2021-05-08 DIAGNOSIS — Z1231 Encounter for screening mammogram for malignant neoplasm of breast: Secondary | ICD-10-CM | POA: Diagnosis not present

## 2021-05-08 DIAGNOSIS — I739 Peripheral vascular disease, unspecified: Secondary | ICD-10-CM

## 2021-05-08 DIAGNOSIS — I10 Essential (primary) hypertension: Secondary | ICD-10-CM | POA: Diagnosis not present

## 2021-05-08 DIAGNOSIS — Z23 Encounter for immunization: Secondary | ICD-10-CM | POA: Diagnosis not present

## 2021-05-08 LAB — CBC WITH DIFFERENTIAL/PLATELET
Basophils Absolute: 0 10*3/uL (ref 0.0–0.1)
Basophils Relative: 0.6 % (ref 0.0–3.0)
Eosinophils Absolute: 0.5 10*3/uL (ref 0.0–0.7)
Eosinophils Relative: 6.7 % — ABNORMAL HIGH (ref 0.0–5.0)
HCT: 37.7 % (ref 36.0–46.0)
Hemoglobin: 11.6 g/dL — ABNORMAL LOW (ref 12.0–15.0)
Lymphocytes Relative: 20.4 % (ref 12.0–46.0)
Lymphs Abs: 1.6 10*3/uL (ref 0.7–4.0)
MCHC: 30.8 g/dL (ref 30.0–36.0)
MCV: 85.1 fl (ref 78.0–100.0)
Monocytes Absolute: 0.8 10*3/uL (ref 0.1–1.0)
Monocytes Relative: 10.6 % (ref 3.0–12.0)
Neutro Abs: 4.7 10*3/uL (ref 1.4–7.7)
Neutrophils Relative %: 61.7 % (ref 43.0–77.0)
Platelets: 265 10*3/uL (ref 150.0–400.0)
RBC: 4.43 Mil/uL (ref 3.87–5.11)
RDW: 15.8 % — ABNORMAL HIGH (ref 11.5–15.5)
WBC: 7.7 10*3/uL (ref 4.0–10.5)

## 2021-05-08 LAB — COMPREHENSIVE METABOLIC PANEL
ALT: 14 U/L (ref 0–35)
AST: 16 U/L (ref 0–37)
Albumin: 3.8 g/dL (ref 3.5–5.2)
Alkaline Phosphatase: 84 U/L (ref 39–117)
BUN: 32 mg/dL — ABNORMAL HIGH (ref 6–23)
CO2: 31 mEq/L (ref 19–32)
Calcium: 9.7 mg/dL (ref 8.4–10.5)
Chloride: 101 mEq/L (ref 96–112)
Creatinine, Ser: 2.34 mg/dL — ABNORMAL HIGH (ref 0.40–1.20)
GFR: 21.89 mL/min — ABNORMAL LOW (ref >60.00)
Glucose, Bld: 189 mg/dL — ABNORMAL HIGH (ref 70–99)
Potassium: 4.8 mEq/L (ref 3.5–5.1)
Sodium: 139 mEq/L (ref 135–145)
Total Bilirubin: 0.4 mg/dL (ref 0.2–1.2)
Total Protein: 7.2 g/dL (ref 6.0–8.3)

## 2021-05-08 NOTE — Patient Instructions (Addendum)
Please monitor blood pressure a home and if blood pressure is greater than 140/90 please take hydralazine 5mg    Let me know if blood pressure remains elevated.   Please continue wet-to-dry dressings as advised by podiatry.  I am working on home health care to help you with dressing changes, wound VAC at home.  As discussed if you develop foul odor, purulent discharge, warmth to your left leg, red streaks fever nausea or chills, any one of these symptoms this would require going to the emergency room to be evaluated to ensure infection is not spreading.   Mammogram as scheduled

## 2021-05-08 NOTE — Progress Notes (Signed)
Subjective:    Patient ID: Chelsea Barton, female    DOB: 02-08-1961, 60 y.o.   MRN: [id number]  CC: Chelsea Barton is a 60 y.o. female who presents today for follow up.   HPI: Feels well today. No new complains.   She is walking on left heel without pain. She feels upbeat and coping well with amputation.   She is not wearing boot/uses crutches at this time. She is waiting on home care to come to the house to apply wound vac. She has supplies at home  and instructed to change twice per week. She changed dressing two days ago. She is unable to place wound vac back on by herself . She was able to switch back to [employer] so she in network for home nursing care visit with podiatry.   Compliant with extended course of augmentin  No f, nausea, vomiting, foot pain, purulent discharge from foot.     Hospitalization follow up with ID 05/03/21 Dr Delaine Lame whom extended augmentin to 05/16/21 with follow up 05/17/21.   HTN- compliant with enalapril 40 mg daily, carvedilol 12.5 mg BID,Farxiga 10 mg daily, digoxin 0.125 mg daily, nifedipine XL 30 mg daily.  She has hydralazine 5-10 mg if BP > 140/90, however hasnt used in a long time. No cp, sob.   Digoxin level obtained during hospitalization one month ago, 0.5   DM type 1 - a1c obtained during hospitalization one month ago, 7.8. She has follow-up with endocrinology ,05/22/2021. She is compliant with glargine 40 units qpm. Complaint with novolog with meals 11-24-08.  FBG today 200 however she hasnt taken novolog today as she didn't eat breakfast today.  No hypoglycemic episodes.   CKD- following with Dr Juleen China, with follow up scheduled 06/12/21  PAD- upcoming follow up with Eulogio Ditch 05/23/21 . She is compliant with plavix.   Due PCV20 and Tdap. She would like to get these at follow up as received Shingrex 2/2 today. She declines PCV20.  Admitted to [facility] 04/10/21 left foot infection and to undergo TMA with second toe amputation  04/12/21. Stent placement left SFA 04/13/21.  Discharged 04/24/21 on augmention. She had wound vac, since removed and reordered by ID at followup.   Following with podiatry Mcdonald as well. Last seen 05/02/21 advised to continued off loading with surgical shoe. Provided mupirocin.   She is changing wet to dry dressing.  HISTORY:  Past Medical History:  Diagnosis Date   Diabetes mellitus    Hyperlipidemia    Hypertension    Past Surgical History:  Procedure Laterality Date   AMPUTATION TOE  04/10/2021   Procedure: AMPUTATION 2nd TOE, left foot;  Surgeon: Edrick Kins, DPM;  Location: [facility] ORS;  Service: Podiatry;;   CATARACT EXTRACTION     eye surgery r Right    lense placed   INCISION AND DRAINAGE Left 04/18/2021   Procedure: INCISION AND DRAINAGE;  Surgeon: [name], DPM;  Location: [facility] ORS;  Service: Podiatry;  Laterality: Left;   INCISION AND DRAINAGE OF WOUND Left 04/10/2021   Procedure: IRRIGATION AND DEBRIDEMENT WOUND;  Surgeon: Edrick Kins, DPM;  Location: [facility] ORS;  Service: Podiatry;  Laterality: Left;   PERIPHERAL VASCULAR BALLOON ANGIOPLASTY Left 04/13/2021   Procedure: PERIPHERAL VASCULAR BALLOON ANGIOPLASTY;  Surgeon: Katha Cabal, MD;  Location: [place] [facility];  Service: Cardiovascular;  Laterality: Left;   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   Procedure: TRANSMETATARSAL AMPUTATION;  Surgeon: Felipa Furnace, DPM;  Location:  [facility] ORS;  Service: Podiatry;  Laterality: Left;   Family History  Problem Relation Age of Onset   Diabetes Sister        3/5 sisters have DM   Breast cancer Neg Hx    Colon cancer Neg Hx     Allergies: Patient has no known allergies. Current Outpatient Medications on File Prior to Visit  Medication Sig Dispense Refill   amoxicillin-clavulanate (AUGMENTIN) 875-125 MG tablet Take 1 tablet by mouth 2 (two) times daily. 60 tablet 0   aspirin 81 MG EC tablet Take 1 tablet (81 mg total) by mouth daily. Swallow whole.  30 tablet 12   carvedilol (COREG) 12.5 MG tablet Take 1 tablet (12.5 mg total) by mouth 2 (two) times daily with a meal. Take one tablet by mouth daily with a meal. 180 tablet 1   clopidogrel (PLAVIX) 75 MG tablet Take 1 tablet (75 mg total) by mouth daily with breakfast. 30 tablet 0   Continuous Blood Gluc Sensor (DEXCOM G6 SENSOR) MISC Place a new sensor every 10 days. Use to check blood sugar at least 4 times daily 9 each 3   dapagliflozin propanediol (FARXIGA) 10 MG TABS tablet Take 10 mg by mouth daily.     digoxin (LANOXIN) 0.125 MG tablet Take 1 tablet (125 mcg total) by mouth daily. 90 tablet 1   enalapril (VASOTEC) 20 MG tablet Take 2 tablets (40 mg total) by mouth daily. 180 tablet 3   hydrALAZINE (APRESOLINE) 10 MG tablet TAKE 1/2 TABLET BY MOUTH THREE TIMES DAILY AS NEEDED(TAKE IF BLOOD PRESSURE IS GREATER THAN 140/90) 30 tablet 1   insulin glargine (LANTUS) 100 UNIT/ML Solostar Pen Inject 40 Units into the skin daily.      Insulin Pen Needle (B-D ULTRAFINE III SHORT PEN) 31G X 8 MM MISC USE AS DIRECTED 100 each 6   Multiple Vitamin (MULTIVITAMIN) capsule Take 1 capsule by mouth daily.     mupirocin ointment (BACTROBAN) 2 % Apply 1 application topically 2 (two) times daily. 30 g 2   NIFEdipine (PROCARDIA-XL/NIFEDICAL-XL) 30 MG 24 hr tablet Take 30 mg by mouth daily.     NOVOLOG FLEXPEN 100 UNIT/ML FlexPen Inject 25 Units into the skin 3 (three) times daily with meals. Take 5 units at breakfast, 10 units at lunch and 10 units at dinner.     rosuvastatin (CRESTOR) 10 MG tablet Take 1 tablet (10 mg total) by mouth daily. 90 tablet 3   Vitamin D, Cholecalciferol, 10 MCG (400 UNIT) CAPS Take 1 capsule by mouth daily.     Continuous Blood Gluc Transmit (DEXCOM G6 TRANSMITTER) MISC Use to check glucose at least 4 times daily 1 each 3   No current facility-administered medications on file prior to visit.    Social History   Tobacco Use   Smoking status: Former    Types: Cigarettes     Quit date: 06/18/2005    Years since quitting: 15.9   Smokeless tobacco: Never  Vaping Use   Vaping Use: Never used  Substance Use Topics   Alcohol use: No   Drug use: No    Review of Systems  Constitutional:  Negative for chills and fever.  Respiratory:  Negative for cough.   Cardiovascular:  Negative for chest pain and palpitations.  Gastrointestinal:  Negative for nausea and vomiting.  Skin:  Positive for wound.     Objective:    BP 140/70 (BP Location: Left Arm, Patient Position: Sitting, Cuff Size: Large)   Pulse 65  Temp 98 F (36.7 C) (Temporal)   Wt 191 lb 6.4 oz (86.8 kg)   SpO2 99%   BMI 31.85 kg/m  BP Readings from Last 3 Encounters:  05/08/21 140/70  05/03/21 (!) 167/79  04/24/21 (!) 159/60   Wt Readings from Last 3 Encounters:  05/08/21 191 lb 6.4 oz (86.8 kg)  05/03/21 180 lb (81.6 kg)  04/13/21 180 lb (81.6 kg)    Physical Exam Vitals reviewed.  Constitutional:      Appearance: She is well-developed.  Eyes:     Conjunctiva/sclera: Conjunctivae normal.  Cardiovascular:     Rate and Rhythm: Normal rate and regular rhythm.     Pulses: Normal pulses.     Heart sounds: Normal heart sounds.  Pulmonary:     Effort: Pulmonary effort is normal.     Breath sounds: Normal breath sounds. No wheezing, rhonchi or rales.  Skin:    General: Skin is warm and dry.     Comments: Removed dressing. Cleaned with sterile saline solution. No odor, purulent discharge noted from wound. Scant blood. Proximal extremity is warm with sensation intact. Nontender.    Neurological:     Mental Status: She is alert.  Psychiatric:        Speech: Speech normal.        Behavior: Behavior normal.        Thought Content: Thought content normal.        Assessment & Plan:   Problem List Items Addressed This Visit       Cardiovascular and Mediastinum   Benign essential hypertension - Primary    Overall stable. Continue enalapril 40 mg daily, carvedilol 12.5 mg BID,Farxiga 10  mg daily, digoxin 0.125 mg daily, nifedipine XL 30 mg daily. Counseled her on importance of monitoring blood pressure at home and if blood pressure is greater than 140/90 to take hydralazine 5mg  .       Relevant Orders   CBC with Differential/Platelet (Completed)   Comprehensive metabolic panel (Completed)   PAD (peripheral artery disease) (HCC)    Chronic, stable.  She is compliant with Plavix.  Has upcoming follow-up with vascular.  Will follow        Other   Left foot amputee ([place])    Well appearing today without signs of infection.  She is compliant with Augmentin,wet-to-dry dressing changes.  I contacted Mcdonell, DPM. in regards to home health from his office coming to see patient to apply wound VAC.  He stated via secure chat that he will arrange from his office now that her insurance has changed.   I changed dressing today which she tolerated without pain.  She will continue close follow-up with podiatry, infectious disease.   will follow      Other Visit Diagnoses     Encounter for screening mammogram for malignant neoplasm of breast       Relevant Orders   MM 3D SCREEN BREAST BILATERAL        I am having Chelsea Barton maintain her multivitamin, NovoLOG FlexPen, insulin glargine, B-D ULTRAFINE III SHORT PEN, Dexcom G6 Sensor, Dexcom G6 Transmitter, enalapril, digoxin, carvedilol, aspirin, dapagliflozin propanediol, NIFEdipine, Vitamin D (Cholecalciferol), rosuvastatin, hydrALAZINE, clopidogrel, mupirocin ointment, and amoxicillin-clavulanate.   No orders of the defined types were placed in this encounter.   Return precautions given.   Risks, benefits, and alternatives of the medications and treatment plan prescribed today were discussed, and patient expressed understanding.   Education regarding symptom management and diagnosis given to patient  on AVS.  Continue to follow with Burnard Hawthorne, FNP for routine health maintenance.   Lexxus D Kryder and I agreed  with plan.   Mable Paris, FNP  I have spent 35 minutes with a patient including precharting, exam, reviewing medical records, hospitalization, consult notes, and discussion plan of care.

## 2021-05-08 NOTE — Telephone Encounter (Signed)
Patient said she has had wound vac since leaving hospital but no one has come to put it on. She saw her surgeon and he did not place it on and her PCP is calling wound care to see why this is still not on. She saw PCP today. She said no one has ordered wound care and she has had no care regarding wound vac it is simply there at her home but not being used. She said her PCP is working on this today.

## 2021-05-08 NOTE — Progress Notes (Signed)
Patient presented for shingrix injection to right deltoid, patient voiced no concerns nor showed any signs of distress during injection. 

## 2021-05-09 DIAGNOSIS — I739 Peripheral vascular disease, unspecified: Secondary | ICD-10-CM | POA: Insufficient documentation

## 2021-05-09 NOTE — Telephone Encounter (Signed)
close

## 2021-05-09 NOTE — Assessment & Plan Note (Signed)
Chronic, stable.  She is compliant with Plavix.  Has upcoming follow-up with vascular.  Will follow

## 2021-05-09 NOTE — Assessment & Plan Note (Addendum)
Well appearing today without signs of infection.  She is compliant with Augmentin,wet-to-dry dressing changes.  I contacted Mcdonell, DPM. in regards to home health from his office coming to see patient to apply wound VAC.  He stated via secure chat that he will arrange from his office now that her insurance has changed.   I changed dressing today which she tolerated without pain.  She will continue close follow-up with podiatry, infectious disease.   will follow

## 2021-05-09 NOTE — Assessment & Plan Note (Signed)
Overall stable. Continue enalapril 40 mg daily, carvedilol 12.5 mg BID,Farxiga 10 mg daily, digoxin 0.125 mg daily, nifedipine XL 30 mg daily. Counseled her on importance of monitoring blood pressure at home and if blood pressure is greater than 140/90 to take hydralazine 5mg  .

## 2021-05-14 ENCOUNTER — Other Ambulatory Visit: Payer: Self-pay

## 2021-05-14 ENCOUNTER — Telehealth: Payer: Self-pay | Admitting: *Deleted

## 2021-05-14 DIAGNOSIS — E1039 Type 1 diabetes mellitus with other diabetic ophthalmic complication: Secondary | ICD-10-CM

## 2021-05-14 DIAGNOSIS — I10 Essential (primary) hypertension: Secondary | ICD-10-CM

## 2021-05-14 NOTE — Telephone Encounter (Signed)
"  I'm calling because I'm trying to figure out when someone is going to come back and put this wound vac back on my foot.  It's been three weeks now and I haven't heard from noone yet.  Can you give me a call back?  Thank you."

## 2021-05-14 NOTE — Telephone Encounter (Signed)
I'm returning your call.  You had your insurance changed to [facility] correct?  "Yes, it won't go into effect until the first of December."  Chelsea Barton, that we were using is not in-network with your present insurance.  They are in-network with [facility].  So, they will not be able to see you until December.  We'll send another order to them for the wound vac to start in December.  "Okay, thank you."

## 2021-05-15 ENCOUNTER — Other Ambulatory Visit: Payer: Self-pay | Admitting: Family

## 2021-05-15 DIAGNOSIS — I10 Essential (primary) hypertension: Secondary | ICD-10-CM

## 2021-05-22 ENCOUNTER — Other Ambulatory Visit (INDEPENDENT_AMBULATORY_CARE_PROVIDER_SITE_OTHER): Payer: Self-pay | Admitting: Vascular Surgery

## 2021-05-22 DIAGNOSIS — I70262 Atherosclerosis of native arteries of extremities with gangrene, left leg: Secondary | ICD-10-CM

## 2021-05-22 DIAGNOSIS — Z95820 Peripheral vascular angioplasty status with implants and grafts: Secondary | ICD-10-CM

## 2021-05-23 ENCOUNTER — Ambulatory Visit (INDEPENDENT_AMBULATORY_CARE_PROVIDER_SITE_OTHER): Payer: Medicaid Other | Admitting: Nurse Practitioner

## 2021-05-23 ENCOUNTER — Other Ambulatory Visit: Payer: Self-pay

## 2021-05-23 ENCOUNTER — Encounter (INDEPENDENT_AMBULATORY_CARE_PROVIDER_SITE_OTHER): Payer: Self-pay | Admitting: Nurse Practitioner

## 2021-05-23 ENCOUNTER — Ambulatory Visit (INDEPENDENT_AMBULATORY_CARE_PROVIDER_SITE_OTHER): Payer: Medicaid Other

## 2021-05-23 VITALS — BP 119/62 | HR 66 | Resp 16 | Wt 190.0 lb

## 2021-05-23 DIAGNOSIS — I10 Essential (primary) hypertension: Secondary | ICD-10-CM

## 2021-05-23 DIAGNOSIS — I70262 Atherosclerosis of native arteries of extremities with gangrene, left leg: Secondary | ICD-10-CM

## 2021-05-23 DIAGNOSIS — E785 Hyperlipidemia, unspecified: Secondary | ICD-10-CM | POA: Diagnosis not present

## 2021-05-23 DIAGNOSIS — I739 Peripheral vascular disease, unspecified: Secondary | ICD-10-CM

## 2021-05-23 DIAGNOSIS — Z95820 Peripheral vascular angioplasty status with implants and grafts: Secondary | ICD-10-CM

## 2021-05-24 ENCOUNTER — Other Ambulatory Visit: Payer: Self-pay

## 2021-05-24 ENCOUNTER — Telehealth: Payer: Self-pay | Admitting: Pharmacist

## 2021-05-24 ENCOUNTER — Other Ambulatory Visit (INDEPENDENT_AMBULATORY_CARE_PROVIDER_SITE_OTHER): Payer: Medicaid Other

## 2021-05-24 ENCOUNTER — Ambulatory Visit: Payer: Medicaid Other | Attending: Infectious Diseases | Admitting: Infectious Diseases

## 2021-05-24 VITALS — BP 116/65 | HR 68 | Resp 16 | Ht 65.0 in | Wt 190.0 lb

## 2021-05-24 DIAGNOSIS — E1140 Type 2 diabetes mellitus with diabetic neuropathy, unspecified: Secondary | ICD-10-CM | POA: Insufficient documentation

## 2021-05-24 DIAGNOSIS — E1152 Type 2 diabetes mellitus with diabetic peripheral angiopathy with gangrene: Secondary | ICD-10-CM | POA: Insufficient documentation

## 2021-05-24 DIAGNOSIS — E1039 Type 1 diabetes mellitus with other diabetic ophthalmic complication: Secondary | ICD-10-CM | POA: Diagnosis not present

## 2021-05-24 DIAGNOSIS — Z794 Long term (current) use of insulin: Secondary | ICD-10-CM | POA: Diagnosis not present

## 2021-05-24 DIAGNOSIS — I129 Hypertensive chronic kidney disease with stage 1 through stage 4 chronic kidney disease, or unspecified chronic kidney disease: Secondary | ICD-10-CM | POA: Insufficient documentation

## 2021-05-24 DIAGNOSIS — Z7902 Long term (current) use of antithrombotics/antiplatelets: Secondary | ICD-10-CM | POA: Insufficient documentation

## 2021-05-24 DIAGNOSIS — E1122 Type 2 diabetes mellitus with diabetic chronic kidney disease: Secondary | ICD-10-CM | POA: Insufficient documentation

## 2021-05-24 DIAGNOSIS — M86172 Other acute osteomyelitis, left ankle and foot: Secondary | ICD-10-CM | POA: Insufficient documentation

## 2021-05-24 DIAGNOSIS — Z89422 Acquired absence of other left toe(s): Secondary | ICD-10-CM | POA: Diagnosis not present

## 2021-05-24 DIAGNOSIS — I10 Essential (primary) hypertension: Secondary | ICD-10-CM

## 2021-05-24 DIAGNOSIS — Z79899 Other long term (current) drug therapy: Secondary | ICD-10-CM | POA: Insufficient documentation

## 2021-05-24 DIAGNOSIS — Z87891 Personal history of nicotine dependence: Secondary | ICD-10-CM | POA: Diagnosis not present

## 2021-05-24 DIAGNOSIS — N189 Chronic kidney disease, unspecified: Secondary | ICD-10-CM | POA: Insufficient documentation

## 2021-05-24 DIAGNOSIS — E11628 Type 2 diabetes mellitus with other skin complications: Secondary | ICD-10-CM | POA: Insufficient documentation

## 2021-05-24 DIAGNOSIS — L089 Local infection of the skin and subcutaneous tissue, unspecified: Secondary | ICD-10-CM | POA: Diagnosis not present

## 2021-05-24 LAB — BASIC METABOLIC PANEL
BUN: 31 mg/dL — ABNORMAL HIGH (ref 6–23)
CO2: 30 mEq/L (ref 19–32)
Calcium: 9.3 mg/dL (ref 8.4–10.5)
Chloride: 103 mEq/L (ref 96–112)
Creatinine, Ser: 2.17 mg/dL — ABNORMAL HIGH (ref 0.40–1.20)
GFR: 23.96 mL/min — ABNORMAL LOW (ref >60.00)
Glucose, Bld: 153 mg/dL — ABNORMAL HIGH (ref 70–99)
Potassium: 4.7 mEq/L (ref 3.5–5.1)
Sodium: 140 mEq/L (ref 135–145)

## 2021-05-24 MED ORDER — DEXCOM G6 TRANSMITTER MISC: 3 refills | Status: DC

## 2021-05-24 MED ORDER — DEXCOM G6 SENSOR MISC: 3 refills | Status: DC

## 2021-05-24 NOTE — Telephone Encounter (Signed)
Received message from lab staff that patient had a question about the transmitter for her DexCom G6  Called. Reports that her insurance is not in network with [facility] anymore. Needs a refill on DexCom transmitter and sensors, reports current transmitter is about to run out.   Due to urgency of situation, will provide refill under PCP. Patient requests referral to a new endocrinologist that is in network. Discussed [facility] in [place]. Patient concerned about transportation. Will place Care Guide referral for transportation assistance. Will collaborate w/ PCP for endocrinology referral   Will also collaborate w/ RN CM for support.

## 2021-05-24 NOTE — Patient Instructions (Addendum)
You are here today for follow up- the left foot wound at the TMA site is doing better and getting smaller- you currently are taking amoxicillin/clavulanate- recommend breaking that pill into half and taking half in the morning and half in the evening. Will follow up 1 month. Follow up with podiatrist

## 2021-05-24 NOTE — Progress Notes (Signed)
NAMEKaralynn Cottone Barton  DOB: 1961-04-11  MRN: [id number]  Date/Time: 05/24/2021 10:00 AM  Subjective:   Follow up visit- last saw her on 11/17 for left foot TMA wound She is taking augmentin 875mg  PO BID- she had labs drawn on 05/09/21 and cr is 2.86 ( crcl is < 30)- she is doing well, no diarrhea, no fveer, no pain abdomen, no rash, no nausea or vomtimg Still does not have wound vac or supplies- changed insurance and that should have kicked in on Dec 1st?. Podiatrist is Dr.Mcdonald and she has ana ppt next Monday Following is takne from the note last visit  Chelsea Barton is a 60 y.o. with a history of Diabetes mellitus,CKD, primary hypertension, is here for follow-up after recent hospitalization.  Patient was admitted on 04/10/2021 to [facility] with left foot infection and had to undergo TMA.  She initially had second toe amputation but the gangrene incision was progressing.  She underwent TMA on 04/12/2021.  She underwent angio and had a culture atherectomy and stent placement in the left SFA on 04/13/2021 .the proximal margin of the wound was free of osteomyelitis. The culture from the left foot was positive for Prevotella, Streptococcus anginosus.  After getting IV antibiotics in the hospital she was discharged on 04/24/2021 on p.o. Augmentin and also a wound VAC. But she no longer has wound vac Past Medical History:  Diagnosis Date   Diabetes mellitus    Hyperlipidemia    Hypertension     Past Surgical History:  Procedure Laterality Date   AMPUTATION TOE  04/10/2021   Procedure: AMPUTATION 2nd TOE, left foot;  Surgeon: Edrick Kins, DPM;  Location: [facility] ORS;  Service: Podiatry;;   CATARACT EXTRACTION     eye surgery r Right    lense placed   INCISION AND DRAINAGE Left 04/18/2021   Procedure: INCISION AND DRAINAGE;  Surgeon: [name], DPM;  Location: [facility] ORS;  Service: Podiatry;  Laterality: Left;   INCISION AND DRAINAGE OF WOUND Left 04/10/2021   Procedure: IRRIGATION AND  DEBRIDEMENT WOUND;  Surgeon: Edrick Kins, DPM;  Location: [facility] OR[facility];  Service: Podiatry;  Laterality: Left;   PERIPHERAL VASCULAR BALLOON ANGIOPLASTY Left 04/13/2021   Procedure: PERIPHERAL VASCULAR BALLOON ANGIOPLASTY;  Surgeon: Katha Cabal, MD;  Location: [place] [facility];  Service: Cardiovascular;  Laterality: Left;   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   Procedure: TRANSMETATARSAL AMPUTATION;  Surgeon: Felipa Furnace, DPM;  Location: [facility] OR[facility];  Service: Podiatry;  Laterality: Left;    Social History   Socioeconomic History   Marital status: Single    Spouse name: Not on file   Number of children: Not on file   Years of education: Not on file   Highest education level: Not on file  Occupational History   Not on file  Tobacco Use   Smoking status: Former    Types: Cigarettes    Quit date: 06/18/2005    Years since quitting: 15.9   Smokeless tobacco: Never  Vaping Use   Vaping Use: Never used  Substance and Sexual Activity   Alcohol use: No   Drug use: No   Sexual activity: Not on file  Other Topics Concern   Not on file  Social History Narrative   Has a great niece that she keeps a lot, 27 yo, '[name]'.  Stays weekends with her.       Has 8 great nieces, 3 nieces, 6 nephews   Social Determinants of Health  Financial Resource Strain: Medium Risk   Difficulty of Paying Living Expenses: Somewhat hard  Food Insecurity: Not on file  Transportation Needs: Not on file  Physical Activity: Not on file  Stress: Not on file  Social Connections: Not on file  Intimate Partner Violence: Not on file    Family History  Problem Relation Age of Onset   Diabetes Sister        3/5 sisters have DM   Breast cancer Neg Hx    Colon cancer Neg Hx    No Known Allergies I? Current Outpatient Medications  Medication Sig Dispense Refill   amoxicillin-clavulanate (AUGMENTIN) 875-125 MG tablet Take 1 tablet by mouth 2 (two) times daily. 60 tablet 0   aspirin 81 MG EC  tablet Take 1 tablet (81 mg total) by mouth daily. Swallow whole. 30 tablet 12   carvedilol (COREG) 12.5 MG tablet Take 1 tablet (12.5 mg total) by mouth 2 (two) times daily with a meal. Take one tablet by mouth daily with a meal. 180 tablet 1   clopidogrel (PLAVIX) 75 MG tablet Take 1 tablet (75 mg total) by mouth daily with breakfast. 30 tablet 0   Continuous Blood Gluc Sensor (DEXCOM G6 SENSOR) MISC Place a new sensor every 10 days. Use to check blood sugar at least 4 times daily 9 each 3   Continuous Blood Gluc Transmit (DEXCOM G6 TRANSMITTER) MISC Use to check glucose at least 4 times daily 1 each 3   dapagliflozin propanediol (FARXIGA) 10 MG TABS tablet Take 10 mg by mouth daily.     digoxin (LANOXIN) 0.125 MG tablet Take 1 tablet (125 mcg total) by mouth daily. 90 tablet 1   enalapril (VASOTEC) 20 MG tablet TAKE 2 TABLETS BY MOUTH DAILY 180 tablet 3   hydrALAZINE (APRESOLINE) 10 MG tablet TAKE 1/2 TABLET BY MOUTH THREE TIMES DAILY AS NEEDED(TAKE IF BLOOD PRESSURE IS GREATER THAN 140/90) 30 tablet 1   insulin glargine (LANTUS) 100 UNIT/ML Solostar Pen Inject 40 Units into the skin daily.      Insulin Pen Needle (B-D ULTRAFINE III SHORT PEN) 31G X 8 MM MISC USE AS DIRECTED 100 each 6   Multiple Vitamin (MULTIVITAMIN) capsule Take 1 capsule by mouth daily.     mupirocin ointment (BACTROBAN) 2 % Apply 1 application topically 2 (two) times daily. 30 g 2   NIFEdipine (PROCARDIA-XL/NIFEDICAL-XL) 30 MG 24 hr tablet Take 30 mg by mouth daily.     NOVOLOG FLEXPEN 100 UNIT/ML FlexPen Inject 25 Units into the skin 3 (three) times daily with meals. Take 5 units at breakfast, 10 units at lunch and 10 units at dinner.     rosuvastatin (CRESTOR) 10 MG tablet Take 1 tablet (10 mg total) by mouth daily. 90 tablet 3   Vitamin D, Cholecalciferol, 10 MCG (400 UNIT) CAPS Take 1 capsule by mouth daily.     No current facility-administered medications for this visit.     Abtx:  Anti-infectives (From admission,  onward)    None       REVIEW OF SYSTEMS:  Const: negative fever, negative chills, negative weight loss Eyes: negative diplopia or visual changes, negative eye pain ENT: negative coryza, negative sore throat Resp: negative cough, hemoptysis, dyspnea Cards: negative for chest pain, palpitations, lower extremity edema GU: negative for frequency, dysuria and hematuria GI: Negative for abdominal pain, diarrhea, bleeding, constipation Skin: negative for rash and pruritus Heme: negative for easy bruising and gum/nose bleeding MS: Weakness. Neurolo:negative for headaches, dizziness, vertigo, memory  problems  Psych: negative for feelings of anxiety, depression  Endocrine:, diabetes Allergy/Immunology- negative for any medication or food allergies ? Objective:  VITALS: BP 116/65   Pulse 68   Resp 16   Ht 5\' 5"  (1.651 m)   Wt 190 lb (86.2 kg)   SpO2 96%   BMI 31.62 kg/m    PHYSICAL EXAM:  General: Alert, cooperative, no distress, appears stated age.  Head: Normocephalic, without obvious abnormality, atraumatic. Eyes: Conjunctivae clear, anicteric sclerae. Pupils are equal ENT Nares normal. No drainage or sinus tenderness. Lips, mucosa, and tongue normal. No Thrush, full set of dentures Neck: Supple, symmetrical, no adenopathy, thyroid: non tender no carotid bruit and no JVD. Back: No CVA tenderness. Lungs: Clear to auscultation bilaterally. No Wheezing or Rhonchi. No rales. Heart: Regular rate and rhythm, no murmur, rub or gallop. Abdomen: Soft, non-tender,not distended. Bowel sounds normal. No masses Extremities: 05/24/21     11/17        Skin: No rashes or lesions. Or bruising Lymph: Cervical, supraclavicular normal. Neurologic: Grossly non-focal Pertinent Labs Lab Results CBC    Component Value Date/Time   WBC 7.7 05/08/2021 1021   RBC 4.43 05/08/2021 1021   HGB 11.6 (L) 05/08/2021 1021   HCT 37.7 05/08/2021 1021   PLT 265.0 05/08/2021 1021   MCV 85.1  05/08/2021 1021   MCH 26.3 04/24/2021 0654   MCHC 30.8 05/08/2021 1021   RDW 15.8 (H) 05/08/2021 1021   LYMPHSABS 1.6 05/08/2021 1021   MONOABS 0.8 05/08/2021 1021   EOSABS 0.5 05/08/2021 1021   BASOSABS 0.0 05/08/2021 1021    CMP Latest Ref Rng & Units 05/08/2021 04/24/2021 04/23/2021  Glucose 70 - 99 mg/dL 189(H) 206(H) 130(H)  BUN 6 - 23 mg/dL 32(H) 24(H) 28(H)  Creatinine 0.40 - 1.20 mg/dL 2.34(H) 1.80(H) 1.88(H)  Sodium 135 - 145 mEq/L 139 139 141  Potassium 3.5 - 5.1 mEq/L 4.8 4.1 4.8  Chloride 96 - 112 mEq/L 101 105 107  CO2 19 - 32 mEq/L 31 27 25   Calcium 8.4 - 10.5 mg/dL 9.7 8.7(L) 8.9  Total Protein 6.0 - 8.3 g/dL 7.2 - -  Total Bilirubin 0.2 - 1.2 mg/dL 0.4 - -  Alkaline Phos 39 - 117 U/L 84 - -  AST 0 - 37 U/L 16 - -  ALT 0 - 35 U/L 14 - -    ? 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  Initially had second toe amputation followed by transmetatarsal amputation.  This is complicated by PAD and neuropathy.  She initially got Unasyn and then was switched over to p.o. Augmentin for a total of 4 weeks and it was extended for 2 more weeks.  Because of worsening cr and dropping crcl- she will take half of the 875mg  in the morning and haif in the evening  The wound is smaller but doubt it will close by itself- she may need skin graft.  Peripheral arterial disease.  crosser atherectomy and stent placement in left SFA on 04/13/2021 On Plavix ? Diabetes mellitus on insulin and Farxiga.  Hypertension on amlodipine  Discussed with patient in great detail. Follow up 1 month

## 2021-05-25 ENCOUNTER — Other Ambulatory Visit: Payer: Self-pay

## 2021-05-25 DIAGNOSIS — E1039 Type 1 diabetes mellitus with other diabetic ophthalmic complication: Secondary | ICD-10-CM

## 2021-05-25 NOTE — Telephone Encounter (Signed)
Chelsea Barton,  Can you get this patient scheduled with RNCM  Thank you have a great day  Noreene Larsson, [address], [place], [place] [address] Direct Dial: [phone] [email or web address]: [email or web address]

## 2021-05-25 NOTE — Telephone Encounter (Signed)
Judson Roch Would you place ref to endo per below for DM I?   Let me know however please reiterate that endocrine referrals are taking a very long time in the upwards of 3 months.  She will need to maintain follow-up with her endocrinologist for her safety until she can be established.  FYI catie

## 2021-05-25 NOTE — Telephone Encounter (Signed)
I have placed urgent referral to [facility] for patient.

## 2021-05-28 ENCOUNTER — Telehealth: Payer: Self-pay | Admitting: Family

## 2021-05-28 ENCOUNTER — Ambulatory Visit: Payer: Medicaid Other | Admitting: Podiatry

## 2021-05-28 ENCOUNTER — Other Ambulatory Visit: Payer: Self-pay

## 2021-05-28 DIAGNOSIS — L97425 Non-pressure chronic ulcer of left heel and midfoot with muscle involvement without evidence of necrosis: Secondary | ICD-10-CM

## 2021-05-28 DIAGNOSIS — E13621 Other specified diabetes mellitus with foot ulcer: Secondary | ICD-10-CM | POA: Diagnosis not present

## 2021-05-28 NOTE — Telephone Encounter (Signed)
..   Medicaid Managed Care   Unsuccessful Outreach Note  05/28/2021 Name: Chelsea Barton MRN: [id number] DOB: 20-Sep-1960  Referred by: Burnard Hawthorne, FNP Reason for referral : High Risk Managed Medicaid (Called patient today to get her scheduled with the MM Team. She did not answer and there was not a VM to leave a message.)   An unsuccessful telephone outreach was attempted today. The patient was referred to the case management team for assistance with care management and care coordination.   Follow Up Plan: The care management team will reach out to the patient again over the next 7 days.   [place]

## 2021-05-28 NOTE — Progress Notes (Signed)
  Subjective:  Patient ID: Chelsea Barton, female    DOB: 29-Apr-1961,  MRN: [id number]  Chief Complaint  Patient presents with   Routine Post Op    Post op--left foot ulcer Pt [place] [id number] R effec 12.01.2022    60 y.o. female presents with the above complaint. History confirmed with patient.  She returns for postop follow-up from the hospital from left transmetatarsal amputation and subsequent debridement. Overall feels well denies fevers chills nausea vomiting.  Date of surgery 04/18/2021  Objective:  Physical Exam: Skin flaps warm and well perfused.  Open ulceration with fiber granular wound bed measuring 7.0 x 4.0 x 1.5 cm left foot, exposed fibrous tendinous structures, no purulence no exposed bone no malodor      Assessment:   1. Ulcer of left foot with muscle involvement without evidence of necrosis [facility])      Plan:  Patient was evaluated and treated and all questions answered.  Ulcer left foot -We discussed the etiology and factors that are a part of the wound healing process.  We also discussed the risk of infection both soft tissue and osteomyelitis from open ulceration.  Discussed the risk of limb loss if this happens or worsens. -Debridement as below. -Dressed with Prisma and DSD -Continue surgical shoe offloading -Referral Sent for [place] to [place] Changes 3 Times Weekly As Well As Prism home wound care supplies  Procedure: Excisional Debridement of Wound Rationale: Removal of non-viable soft tissue from the wound to promote healing.  Anesthesia: none Post-Debridement Wound Measurements: 7.0 x 4.0 x 1.5 Type of Debridement: Sharp Excisional Tissue Removed: Non-viable soft tissue Depth of Debridement: subcutaneous tissue. Technique: Sharp excisional debridement to bleeding, viable wound base.  Dressing: Dry, sterile, compression dressing. Disposition: Patient tolerated procedure well. Patient to return in 2 week for follow-up.No  follow-ups on file.

## 2021-05-29 ENCOUNTER — Ambulatory Visit
Admission: RE | Admit: 2021-05-29 | Discharge: 2021-05-29 | Disposition: A | Discharge status: Home / Self Care | Payer: Medicaid Other | Source: Ambulatory Visit | Attending: Family | Admitting: Family

## 2021-05-29 DIAGNOSIS — Z1231 Encounter for screening mammogram for malignant neoplasm of breast: Secondary | ICD-10-CM | POA: Insufficient documentation

## 2021-05-29 NOTE — Telephone Encounter (Signed)
Pt called in regards to her transmitter. Pt states she needs a letter to be sent to the insurance company so that they will pay for it. Pt states her transmitter is about to run out.

## 2021-05-29 NOTE — Telephone Encounter (Signed)
Called pharmacy. They note that a PA is required for the sensors, but that the transmitter was filled already.   Completed PA for DexCom G6 sensors via Cover My Meds ([id number])

## 2021-05-30 ENCOUNTER — Telehealth: Payer: Self-pay | Admitting: Family

## 2021-05-30 NOTE — Telephone Encounter (Signed)
Terra from [employer]/Pt called in regarding Pt (Continuous Blood Gluc Transmit (DEXCOM G6 TRANSMITTER) MISC). Eustace Pen stated that the insurance denied Pt for (Continuous Blood Gluc Transmit (DEXCOM G6 TRANSMITTER) MISC) because she needs Prior Approval/Auth from Pcp. Advise Eustace Pen from [facility] that NP Arnett did receive the fax. Advise Eustace Pen that it can take up to 1 to 3 business day for a response. Eustace Pen was wondering if someone can call the prior auth department to give auth at [id number]. Pt requesting callback with update that everything was sent over.

## 2021-05-31 ENCOUNTER — Other Ambulatory Visit: Payer: Self-pay

## 2021-05-31 ENCOUNTER — Other Ambulatory Visit: Payer: Self-pay | Admitting: *Deleted

## 2021-05-31 NOTE — Telephone Encounter (Signed)
I called the pharmacy yesterday, they noted the transmitter went through the insurance fine but the sensors required a PA. I submitted a PA for the sensors and got this response today:   We received a prior authorization request for the member and product listed above. The Community [facility] Prior Authorization Team is not able to review this request because the requested product has been previously approved under [id number]. Based on the information reviewed, the requested prescription is currently authorized for coverage by the plan until 10/10/2021. Please resubmit this request within 30 days of authorization expiration date"  [facility] as above. Apparently the [facility] told me backwards, the transmitter needed a PA. Completed PA over the phone. Reference number is APPROVED for [facility] G6 transmitter 05/31/21-05/31/2022 (approval reference number: [id number])  Confirmed that DexCom G6 sensors approval is good until 10/10/2021.    [facility], they ran the transmitter script and it went through for $0. They are filling today.

## 2021-05-31 NOTE — Patient Instructions (Signed)
Visit Information  Ms. Hermans was given information about Medicaid Managed Care team care coordination services as a part of their [place] Medicaid benefit. Calie D Kaczorowski verbally consented to engagement with the [facility] Managed Care team.   If you are experiencing a medical emergency, please call 911 or report to your local emergency department or urgent care.   If you have a non-emergency medical problem during routine business hours, please contact your provider's office and ask to speak with a nurse.   For questions related to your [facility], please call: [phone] or visit the homepage here: [email or web address]  If you would like to schedule transportation through your [facility], please call the following number at least 2 days in advance of your appointment: [phone].   Call the [place] at [phone], at any time, 24 hours a day, 7 days a week. If you are in danger or need immediate medical attention call 911.  If you would like help to quit smoking, call 1-800-QUIT-NOW (952) 771-2136) OR Espaol: 1-855-Djelo-Ya (3-295-188-4166) o para ms informacin haga clic aqu or Text READY to 200-400 to register via text  Ms. Siddiqi - following are the goals we discussed in your visit today:   Goals Addressed   None     Please see education materials related to wound care provided as print materials.   The patient verbalized understanding of instructions provided today and agreed to receive a mailed copy of patient instruction and/or educational materials.  Telephone follow up appointment with Managed Medicaid care management team member scheduled for:06/14/21 @ 2:30pm  [name] RN, BSN [place] RN Care Coordinator   Following is a copy of your plan of care:  Care Plan : RN Care Manager  Plan of Care  Updates made by Melissa Montane, RN since 05/31/2021 12:00 AM     Problem: Health Management Needs Related to Wound Care and DMI      Long-Range Goal: Development of Plan of Care Addressing Health Management Needs Related to Wound Care and DMI   Start Date: 05/31/2021  Expected End Date: 08/29/2021  Priority: High  Note:   Current Barriers:  Chronic Disease Management support and education needs related to Wound Care and DMI Ms. Almyra Deforest is with recent left transmetatarsal amputation and will be needing supplies for dressing changes. She was expecting HH to provide Wound Vac dressing changes, but has not heard from the agency. She is managing the wound care on her on.  RNCM Clinical Goal(s):  Patient will verbalize understanding of plan for management of Wound Care and DMI as evidenced by patient verbalization of self monitoring activities attend all scheduled medical appointments: 12/28 with Dr. Sherryle Lis and 06/26/21 with Dr. Delaine Lame as evidenced by provider documentation in EMR        demonstrate improved adherence to prescribed treatment plan for Wound Care and DMI as evidenced by documentation in EMR work with community resource care guide to address needs related to Limited access to food as evidenced by patient and/or community resource care guide support    through collaboration with Consulting civil engineer, provider, and care team.   Interventions: Inter-disciplinary care team collaboration (see longitudinal plan of care) Evaluation of current treatment plan related to  self management and patient's adherence to plan as established by provider Provided patient with [facility] medical transportation [phone]   Diabetes:  (Status: New goal.) Long Term Goal  Lab Results  Component Value Date   HGBA1C 7.8 (H) 04/10/2021  Assessed patient's understanding of A1c goal: <7% Provided education to patient about basic DM disease process; Reviewed medications with patient and  discussed importance of medication adherence;        Reviewed prescribed diet with patient diabetic diet, discussed including lean protein to her bedtime snack, examples provided; Discussed plans with patient for ongoing care management follow up and provided patient with direct contact information for care management team;      Reviewed scheduled/upcoming provider appointments including: 12/28 with Dr. Sherryle Lis and 1/10 with Dr. Delaine Lame;         call provider for findings outside established parameters;       Referral made to community resources care guide team for assistance with food insecurity;      Review of patient status, including review of consultants reports, relevant laboratory and other test results, and medications completed;       Assessed social determinant of health barriers;        Discussed Endocrinology referral, RNCM will follow closely to make sure appointment gets scheduled  Wound Care  (Status: New goal.) Long Term Goal  Evaluation of current treatment plan related to  Wound Care , Limited access to food self-management and patient's adherence to plan as established by provider. Discussed plans with patient for ongoing care management follow up and provided patient with direct contact information for care management team Provided education to patient re: wound care; Reviewed medications with patient and discussed taking all antibiotics as directed; Collaborated with Dr. Sherryle Lis regarding patient needed dressing supplies and [place] agency not in network with patient's insurance; Reviewed scheduled/upcoming provider appointments including 12/28 with Dr. Sherryle Lis; Care Guide referral for food insecurity; Discussed plans with patient for ongoing care management follow up and provided patient with direct contact information for care management team; Assessed social determinant of health barriers;  [facility] contacted [facility] [phone] re: wound care/wound vac. This  [place] agency is not in network  Patient Goals/Self-Care Activities: Take medications as prescribed   Attend all scheduled provider appointments Call pharmacy for medication refills 3-7 days in advance of running out of medications Attend church or other social activities Perform all self care activities independently  Perform IADL's (shopping, preparing meals, housekeeping, managing finances) independently Call provider office for new concerns or questions  take the blood sugar meter to all doctor visits drink 6 to 8 glasses of water each day fill half of plate with vegetables manage portion size

## 2021-05-31 NOTE — Patient Outreach (Signed)
Medicaid Managed Care   Nurse Care Manager Note  05/31/2021 Name:  Chelsea Barton MRN:  [id number] DOB:  1961-04-12  Chelsea Barton is an 60 y.o. year old female who is a primary patient of Burnard Hawthorne, FNP.  The [facility] Managed Care Coordination team was consulted for assistance with:    DMI Wound care  Chelsea Barton was given information about Medicaid Managed Care Coordination team services today. Chelsea Barton Patient agreed to services and verbal consent obtained.  Engaged with patient by telephone for initial visit in response to provider referral for case management and/or care coordination services.   Assessments/Interventions:  Review of past medical history, allergies, medications, health status, including review of consultants reports, laboratory and other test data, was performed as part of comprehensive evaluation and provision of chronic care management services.  SDOH (Social Determinants of Health) assessments and interventions performed: SDOH Interventions    Flowsheet Row Most Recent Value  SDOH Interventions   Food Insecurity Interventions Other (Comment)  [Care Guide referral placed]  Housing Interventions Intervention Not Indicated  Transportation Interventions Intervention Not Indicated       Care Plan  No Known Allergies  Medications Reviewed Today     Reviewed by Melissa Montane, RN (Registered Nurse) on 05/31/21 at 1145  Med List Status: <None>   Medication Order Taking? Sig Documenting Provider Last Dose Status Informant  amoxicillin-clavulanate (AUGMENTIN) 875-125 MG tablet [id number] Yes Take 1 tablet by mouth 2 (two) times daily. Tsosie Billing, MD Taking Active   aspirin 81 MG EC tablet [id number] Yes Take 1 tablet (81 mg total) by mouth daily. Swallow whole. Burnard Hawthorne, FNP Taking Active Self  carvedilol (COREG) 12.5 MG tablet [id number] Yes Take 1 tablet (12.5 mg total) by mouth 2 (two) times daily with a meal. Take one  tablet by mouth daily with a meal. Burnard Hawthorne, FNP Taking Active Self  Continuous Blood Gluc Sensor (DEXCOM G6 SENSOR) MISC [id number] Yes Place a new sensor every 10 days. Use to check blood sugar at least 4 times daily Burnard Hawthorne, FNP Taking Active   Continuous Blood Gluc Transmit (DEXCOM G6 TRANSMITTER) MISC [id number] Yes Use to check glucose at least 4 times daily Burnard Hawthorne, FNP Taking Active   dapagliflozin propanediol (FARXIGA) 10 MG TABS tablet [id number] Yes Take 10 mg by mouth daily. [provider] Taking Active Self  digoxin (LANOXIN) 0.125 MG tablet [id number] Yes Take 1 tablet (125 mcg total) by mouth daily. Burnard Hawthorne, FNP Taking Active Self  enalapril (VASOTEC) 20 MG tablet [id number] Yes TAKE 2 TABLETS BY MOUTH DAILY Burnard Hawthorne, FNP Taking Active   hydrALAZINE (APRESOLINE) 10 MG tablet [id number] Yes TAKE 1/2 TABLET BY MOUTH THREE TIMES DAILY AS NEEDED(TAKE IF BLOOD PRESSURE IS GREATER THAN 140/90) Burnard Hawthorne, FNP Taking Active Self  insulin glargine (LANTUS) 100 UNIT/ML Solostar Pen [id number] Yes Inject 40 Units into the skin daily.  [provider] Taking Active Self  Insulin Pen Needle (B-D ULTRAFINE III SHORT PEN) 31G X 8 MM MISC [id number] Yes USE AS DIRECTED Burnard Hawthorne, FNP Taking Active Self  Multiple Vitamin (MULTIVITAMIN) capsule [id number] Yes Take 1 capsule by mouth daily. [provider] Taking Active Self  mupirocin ointment (BACTROBAN) 2 % [id number] Yes Apply 1 application topically 2 (two) times daily. [name], DPM Taking Active   NIFEdipine (PROCARDIA-XL/NIFEDICAL-XL) 30 MG 24 hr tablet [id number] Yes Take 30 mg by mouth daily. [provider] Taking Active Self  NOVOLOG FLEXPEN 100 UNIT/ML FlexPen [id number] Yes Inject 25 Units into the skin 3 (three) times daily with meals. Take 5 units at breakfast, 10 units at lunch and 10 units at dinner. [provider] Taking Active  Self  rosuvastatin (CRESTOR) 10 MG tablet [id number] Yes Take 1 tablet (10 mg total) by mouth daily. Burnard Hawthorne, FNP Taking Active Self  Vitamin D, Cholecalciferol, 10 MCG (400 UNIT) CAPS [id number] Yes Take 1 capsule by mouth daily. [provider] Taking Active Self            Patient Active Problem List   Diagnosis Date Noted   PAD (peripheral artery disease) ([place]) 05/09/2021   Myonecrosis ([place])    Osteomyelitis of foot, left, acute ([place]) 04/11/2021   Left foot amputee ([place]) 04/10/2021   Aortic atherosclerosis ([place]) 06/22/2020   Benign hypertensive kidney disease with chronic kidney disease 08/17/2019   Proteinuria 08/17/2019   Secondary hyperparathyroidism of renal origin ([place]) 08/17/2019   Proliferative diabetic retinopathy associated with type 1 diabetes mellitus ([place]) 03/25/2019   Esophageal thickening 03/18/2019   Pain due to onychomycosis of toenails of both feet 12/14/2018   Diabetic neuropathy ([place]) 12/14/2018   Long-term insulin use ([place]) 08/03/2018   CKD (chronic kidney disease) 11/20/2016   CAD (coronary artery disease) 11/20/2016   Mitral valve disease 11/20/2016   Impaired vision 11/20/2016   Moderate mitral insufficiency 03/06/2015   DM type 1 with diabetic peripheral neuropathy ([place]) 03/01/2015   Uncontrolled type 1 diabetes mellitus with hyperglycemia, with long-term current use of insulin ([place]) 03/01/2015   Benign essential hypertension 10/03/2014   Routine general medical examination at a health care facility 12/14/2012   Obesity 12/14/2012   Hypertension 04/01/2011   Hyperlipidemia 04/01/2011   DM type 1 (diabetes mellitus, type 1) ([place]) 04/01/2011    Conditions to be addressed/monitored per PCP order:   DMI and Wound care  Care Plan : RN Care Manager Plan of Care  Updates made by Melissa Montane, RN since 05/31/2021 12:00 AM     Problem: Health Management Needs Related to Wound Care and DMI      Long-Range Goal: Development of  Plan of Care Addressing Health Management Needs Related to Wound Care and DMI   Start Date: 05/31/2021  Expected End Date: 08/29/2021  Priority: High  Note:   Current Barriers:  Chronic Disease Management support and education needs related to Wound Care and DMI Chelsea Barton is with recent left transmetatarsal amputation and will be needing supplies for dressing changes. She was expecting HH to provide Wound Vac dressing changes, but has not heard from the agency. She is managing the wound care on her on.  RNCM Clinical Goal(s):  Patient will verbalize understanding of plan for management of Wound Care and DMI as evidenced by patient verbalization of self monitoring activities attend all scheduled medical appointments: 12/28 with Dr. Sherryle Lis and 06/26/21 with Dr. Delaine Lame as evidenced by provider documentation in EMR        demonstrate improved adherence to prescribed treatment plan for Wound Care and DMI as evidenced by documentation in EMR work with community resource care guide to address needs related to Limited access to food as evidenced by patient and/or community resource care guide support    through collaboration with Consulting civil engineer, provider, and care team.   Interventions: Inter-disciplinary care team collaboration (see longitudinal plan of care) Evaluation of current treatment plan related to  self management and patient's adherence  to plan as established by provider Provided patient with [facility] medical transportation [phone]   Diabetes:  (Status: New goal.) Long Term Goal   Lab Results  Component Value Date   HGBA1C 7.8 (H) 04/10/2021  Assessed patient's understanding of A1c goal: <7% Provided education to patient about basic DM disease process; Reviewed medications with patient and discussed importance of medication adherence;        Reviewed prescribed diet with patient diabetic diet, discussed including lean protein to her bedtime snack, examples provided; Discussed  plans with patient for ongoing care management follow up and provided patient with direct contact information for care management team;      Reviewed scheduled/upcoming provider appointments including: 12/28 with Dr. Sherryle Lis and 1/10 with Dr. Delaine Lame;         call provider for findings outside established parameters;       Referral made to community resources care guide team for assistance with food insecurity;      Review of patient status, including review of consultants reports, relevant laboratory and other test results, and medications completed;       Assessed social determinant of health barriers;        Discussed Endocrinology referral, RNCM will follow closely to make sure appointment gets scheduled  Wound Care  (Status: New goal.) Long Term Goal  Evaluation of current treatment plan related to  Wound Care , Limited access to food self-management and patient's adherence to plan as established by provider. Discussed plans with patient for ongoing care management follow up and provided patient with direct contact information for care management team Provided education to patient re: wound care; Reviewed medications with patient and discussed taking all antibiotics as directed; Collaborated with Dr. Sherryle Lis regarding patient needed dressing supplies and [place] agency not in network with patient's insurance; Reviewed scheduled/upcoming provider appointments including 12/28 with Dr. Sherryle Lis; Care Guide referral for food insecurity; Discussed plans with patient for ongoing care management follow up and provided patient with direct contact information for care management team; Assessed social determinant of health barriers;  [facility] contacted [facility] [phone] re: wound care/wound vac. This [place] agency is not in network  Patient Goals/Self-Care Activities: Take medications as prescribed   Attend all scheduled provider appointments Call pharmacy for medication refills 3-7 days  in advance of running out of medications Attend church or other social activities Perform all self care activities independently  Perform IADL's (shopping, preparing meals, housekeeping, managing finances) independently Call provider office for new concerns or questions  take the blood sugar meter to all doctor visits drink 6 to 8 glasses of water each day fill half of plate with vegetables manage portion size       Follow Up:  Patient agrees to Care Plan and Follow-up.  Plan: The Managed Medicaid care management team will reach out to the patient again over the next 14 days.  Date/time of next scheduled RN care management/care coordination outreach:  06/14/21 @ 2:30pm  Chelsea Joiner RN, BSN [place] RN Care Coordinator

## 2021-06-03 ENCOUNTER — Encounter (INDEPENDENT_AMBULATORY_CARE_PROVIDER_SITE_OTHER): Payer: Self-pay | Admitting: Nurse Practitioner

## 2021-06-03 NOTE — Progress Notes (Signed)
Subjective:    Patient ID: Chelsea Barton, female    DOB: 25-Feb-1961, 60 y.o.   MRN: [id number] Chief Complaint  Patient presents with   Follow-up    [facility] 1 month post op     Chelsea Barton is a 60 year old female that returns to the office for followup and review status post angiogram with intervention. The patient notes improvement in the lower extremity symptoms. No interval shortening of the patient's claudication distance or rest pain symptoms.  The patient had a left transmetatarsal amputation done by podiatry.  There have been no significant changes to the patient's overall health care.  The patient denies amaurosis fugax or recent TIA symptoms. There are no recent neurological changes noted. The patient denies history of DVT, PE or superficial thrombophlebitis. The patient denies recent episodes of angina or shortness of breath.   ABI's Rt=1.17 and Lt=1.19  (no previous ABI) Duplex US of the right lower extremity shows biphasic waveforms of the left lower extremity having monophasic/biphasic waveforms.  The right lower extremity has been toe waveforms.  There are no toe waveforms due to the transmetatarsal amputation on the left.   Review of Systems  Cardiovascular:  Negative for leg swelling.  Skin:  Positive for wound.  All other systems reviewed and are negative.     Objective:   Physical Exam Vitals reviewed.  HENT:     Head: Normocephalic.  Cardiovascular:     Rate and Rhythm: Normal rate.     Pulses:          Dorsalis pedis pulses are 1+ on the right side.       Posterior tibial pulses are 1+ on the right side.  Pulmonary:     Effort: Pulmonary effort is normal.  Musculoskeletal:     Cervical back: Normal range of motion.     Left Lower Extremity: Left leg is amputated below ankle.  Skin:    General: Skin is warm and dry.  Neurological:     Mental Status: She is alert and oriented to person, place, and time.  Psychiatric:        Mood and Affect: Mood  normal.        Behavior: Behavior normal.        Thought Content: Thought content normal.        Judgment: Judgment normal.    BP 119/62 (BP Location: Left Arm)    Pulse 66    Resp 16    Wt 190 lb (86.2 kg)    BMI 31.62 kg/m   Past Medical History:  Diagnosis Date   Diabetes mellitus    Hyperlipidemia    Hypertension     Social History   Socioeconomic History   Marital status: Single    Spouse name: Not on file   Number of children: Not on file   Years of education: Not on file   Highest education level: Not on file  Occupational History   Not on file  Tobacco Use   Smoking status: Former    Types: Cigarettes    Quit date: 06/18/2005    Years since quitting: 15.9   Smokeless tobacco: Never  Vaping Use   Vaping Use: Never used  Substance and Sexual Activity   Alcohol use: No   Drug use: No   Sexual activity: Not on file  Other Topics Concern   Not on file  Social History Narrative   Has a great niece that she keeps a lot, 2  yo, '[name]'.  Stays weekends with her.       Has 8 great nieces, 3 nieces, 6 nephews   Social Determinants of Radio broadcast assistant Strain: Medium Risk   Difficulty of Paying Living Expenses: Somewhat hard  Food Insecurity: Landscape architect Present   Worried About Charity fundraiser in the Last Year: Sometimes true   Arboriculturist in the Last Year: Sometimes true  Transportation Needs: No Transportation Needs   Lack of Transportation (Medical): No   Lack of Transportation (Non-Medical): No  Physical Activity: Not on file  Stress: Not on file  Social Connections: Not on file  Intimate Partner Violence: Not on file    Past Surgical History:  Procedure Laterality Date   AMPUTATION TOE  04/10/2021   Procedure: AMPUTATION 2nd TOE, left foot;  Surgeon: Edrick Kins, DPM;  Location: [facility] ORS;  Service: Podiatry;;   CATARACT EXTRACTION     eye surgery r Right    lense placed   INCISION AND DRAINAGE Left 04/18/2021   Procedure:  INCISION AND DRAINAGE;  Surgeon: [name], DPM;  Location: [facility] OR[facility];  Service: Podiatry;  Laterality: Left;   INCISION AND DRAINAGE OF WOUND Left 04/10/2021   Procedure: IRRIGATION AND DEBRIDEMENT WOUND;  Surgeon: Edrick Kins, DPM;  Location: [facility];  Service: Podiatry;  Laterality: Left;   PERIPHERAL VASCULAR BALLOON ANGIOPLASTY Left 04/13/2021   Procedure: PERIPHERAL VASCULAR BALLOON ANGIOPLASTY;  Surgeon: Katha Cabal, MD;  Location: [facility];  Service: Cardiovascular;  Laterality: Left;   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   Procedure: TRANSMETATARSAL AMPUTATION;  Surgeon: Felipa Furnace, DPM;  Location: [facility];  Service: Podiatry;  Laterality: Left;    Family History  Problem Relation Age of Onset   Diabetes Sister        45/5 sisters have DM   Breast cancer Neg Hx    Colon cancer Neg Hx     No Known Allergies  CBC Latest Ref Rng & Units 05/08/2021 04/24/2021 04/23/2021  WBC 4.0 - 10.5 K/uL 7.7 8.5 11.5(H)  Hemoglobin 12.0 - 15.0 g/dL 11.6(L) 9.7(L) 10.0(L)  Hematocrit 36.0 - 46.0 % 37.7 32.5(L) 33.3(L)  Platelets 150.0 - 400.0 K/uL 265.0 341 358      CMP     Component Value Date/Time   NA 140 05/24/2021 1052   NA 137 01/20/2014 0000   K 4.7 05/24/2021 1052   CL 103 05/24/2021 1052   CO2 30 05/24/2021 1052   GLUCOSE 153 (H) 05/24/2021 1052   BUN 31 (H) 05/24/2021 1052   BUN 25 (A) 01/20/2014 0000   CREATININE 2.17 (H) 05/24/2021 1052   CALCIUM 9.3 05/24/2021 1052   PROT 7.2 05/08/2021 1021   ALBUMIN 3.8 05/08/2021 1021   AST 16 05/08/2021 1021   ALT 14 05/08/2021 1021   ALKPHOS 84 05/08/2021 1021   BILITOT 0.4 05/08/2021 1021   GFRNONAA 32 (L) 04/24/2021 0654   GFRAA 53 (L) 11/14/2017 1349     VAS [name] ABI WITH/WO TBI  Result Date: 05/28/2021  LOWER EXTREMITY DOPPLER STUDY Patient Name:  Chelsea Barton  Date of Exam:   05/23/2021 Medical Rec #: [id number]        Accession #:    [id number] Date of Birth: 1961-01-15          Patient Gender: F Patient Age:   9 years Exam Location:  [facility] Procedure:      VAS [name] ABI  WITH/WO TBI Referring Phys: --------------------------------------------------------------------------------   Vascular Interventions: 03/2021 Left PTA and stents                         Left forefoot amputation. Performing Technologist: Concha Norway RVT  Examination Guidelines: A complete evaluation includes at minimum, Doppler waveform signals and systolic blood pressure reading at the level of bilateral brachial, anterior tibial, and posterior tibial arteries, when vessel segments are accessible. Bilateral testing is considered an integral part of a complete examination. Photoelectric Plethysmograph (PPG) waveforms and toe systolic pressure readings are included as required and additional duplex testing as needed. Limited examinations for reoccurring indications may be performed as noted.  ABI Findings: +---------+------------------+-----+--------+--------+  Right     Rt Pressure (mmHg) Index Waveform Comment   +---------+------------------+-----+--------+--------+  Brachial  155                                         +---------+------------------+-----+--------+--------+  ATA       161                      biphasic           +---------+------------------+-----+--------+--------+  PTA       181                1.17  biphasic           +---------+------------------+-----+--------+--------+  Great Toe 117                0.75  Normal             +---------+------------------+-----+--------+--------+ +---------+------------------+-----+----------+-------+  Left      Lt Pressure (mmHg) Index Waveform   Comment  +---------+------------------+-----+----------+-------+  Brachial  155                                          +---------+------------------+-----+----------+-------+  ATA       175                      monophasic          +---------+------------------+-----+----------+-------+  PTA       184                 1.19  biphasic            +---------+------------------+-----+----------+-------+  Great Toe                                     absent   +---------+------------------+-----+----------+-------+  Summary: Right: Resting right ankle-brachial index is within normal range. No evidence of significant right lower extremity arterial disease. The right toe-brachial index is normal. Left: Resting left ankle-brachial index is within normal range. No evidence of significant left lower extremity arterial disease. Left forefoot amputated.  *See table(s) above for measurements and observations.  Electronically signed by [name] MD on 05/28/2021 at 4:52:07 PM.    Final        Assessment & Plan:   1. PAD (peripheral artery disease) ([place]) Patient had a left forefoot amputation done by podiatry.  Noninvasive studies done today shows she should have adequate perfusion for wound healing.  She will continue to follow with podiatry for wound care.  We will have the patient return in 3 months for noninvasive studies or sooner if patient's wound healing begins to deteriorate.  2. Primary hypertension Continue antihypertensive medications as already ordered, these medications have been reviewed and there are no changes at this time.   3. Hyperlipidemia, unspecified hyperlipidemia type Continue statin as ordered and reviewed, no changes at this time    Current Outpatient Medications on File Prior to Visit  Medication Sig Dispense Refill   aspirin 81 MG EC tablet Take 1 tablet (81 mg total) by mouth daily. Swallow whole. 30 tablet 12   carvedilol (COREG) 12.5 MG tablet Take 1 tablet (12.5 mg total) by mouth 2 (two) times daily with a meal. Take one tablet by mouth daily with a meal. 180 tablet 1   dapagliflozin propanediol (FARXIGA) 10 MG TABS tablet Take 10 mg by mouth daily.     digoxin (LANOXIN) 0.125 MG tablet Take 1 tablet (125 mcg total) by mouth daily. 90 tablet 1   enalapril (VASOTEC) 20 MG tablet  TAKE 2 TABLETS BY MOUTH DAILY 180 tablet 3   hydrALAZINE (APRESOLINE) 10 MG tablet TAKE 1/2 TABLET BY MOUTH THREE TIMES DAILY AS NEEDED(TAKE IF BLOOD PRESSURE IS GREATER THAN 140/90) 30 tablet 1   insulin glargine (LANTUS) 100 UNIT/ML Solostar Pen Inject 40 Units into the skin daily.      Insulin Pen Needle (B-D ULTRAFINE III SHORT PEN) 31G X 8 MM MISC USE AS DIRECTED 100 each 6   Multiple Vitamin (MULTIVITAMIN) capsule Take 1 capsule by mouth daily.     mupirocin ointment (BACTROBAN) 2 % Apply 1 application topically 2 (two) times daily. 30 g 2   NIFEdipine (PROCARDIA-XL/NIFEDICAL-XL) 30 MG 24 hr tablet Take 30 mg by mouth daily.     NOVOLOG FLEXPEN 100 UNIT/ML FlexPen Inject 25 Units into the skin 3 (three) times daily with meals. Take 5 units at breakfast, 10 units at lunch and 10 units at dinner.     rosuvastatin (CRESTOR) 10 MG tablet Take 1 tablet (10 mg total) by mouth daily. 90 tablet 3   Vitamin D, Cholecalciferol, 10 MCG (400 UNIT) CAPS Take 1 capsule by mouth daily.     No current facility-administered medications on file prior to visit.    There are no Patient Instructions on file for this visit. No follow-ups on file.   Kris Hartmann, NP

## 2021-06-06 DIAGNOSIS — E1122 Type 2 diabetes mellitus with diabetic chronic kidney disease: Secondary | ICD-10-CM | POA: Diagnosis not present

## 2021-06-06 DIAGNOSIS — R809 Proteinuria, unspecified: Secondary | ICD-10-CM | POA: Diagnosis not present

## 2021-06-06 DIAGNOSIS — N184 Chronic kidney disease, stage 4 (severe): Secondary | ICD-10-CM | POA: Diagnosis not present

## 2021-06-06 DIAGNOSIS — I129 Hypertensive chronic kidney disease with stage 1 through stage 4 chronic kidney disease, or unspecified chronic kidney disease: Secondary | ICD-10-CM | POA: Diagnosis not present

## 2021-06-06 DIAGNOSIS — N2581 Secondary hyperparathyroidism of renal origin: Secondary | ICD-10-CM | POA: Diagnosis not present

## 2021-06-13 ENCOUNTER — Ambulatory Visit (INDEPENDENT_AMBULATORY_CARE_PROVIDER_SITE_OTHER): Payer: Medicaid Other | Admitting: Podiatry

## 2021-06-13 ENCOUNTER — Encounter: Payer: Medicaid Other | Admitting: Podiatry

## 2021-06-13 ENCOUNTER — Other Ambulatory Visit: Payer: Self-pay

## 2021-06-13 DIAGNOSIS — L97426 Non-pressure chronic ulcer of left heel and midfoot with bone involvement without evidence of necrosis: Secondary | ICD-10-CM

## 2021-06-13 DIAGNOSIS — E13621 Other specified diabetes mellitus with foot ulcer: Secondary | ICD-10-CM | POA: Diagnosis not present

## 2021-06-13 DIAGNOSIS — L97425 Non-pressure chronic ulcer of left heel and midfoot with muscle involvement without evidence of necrosis: Secondary | ICD-10-CM | POA: Diagnosis not present

## 2021-06-13 MED ORDER — AMOXICILLIN-POT CLAVULANATE 875-125 MG PO TABS: 1.0000 | ORAL_TABLET | Freq: Two times a day (BID) | ORAL | 0 refills | Status: DC

## 2021-06-14 ENCOUNTER — Other Ambulatory Visit: Payer: Self-pay | Admitting: *Deleted

## 2021-06-14 DIAGNOSIS — L97425 Non-pressure chronic ulcer of left heel and midfoot with muscle involvement without evidence of necrosis: Secondary | ICD-10-CM | POA: Diagnosis not present

## 2021-06-14 DIAGNOSIS — E13621 Other specified diabetes mellitus with foot ulcer: Secondary | ICD-10-CM | POA: Diagnosis not present

## 2021-06-14 NOTE — Patient Instructions (Signed)
Visit Information  Chelsea Barton was given information about Medicaid Managed Care team care coordination services as a part of their [place] Medicaid benefit. Deija D Seda verbally consented to engagement with the [facility] Managed Care team.   If you are experiencing a medical emergency, please call 911 or report to your local emergency department or urgent care.   If you have a non-emergency medical problem during routine business hours, please contact your provider's office and ask to speak with a nurse.   For questions related to your [facility], please call: [phone] or visit the homepage here: [email or web address]  If you would like to schedule transportation through your [facility], please call the following number at least 2 days in advance of your appointment: [phone].   Call the [facility] at [phone], at any time, 24 hours a day, 7 days a week. If you are in danger or need immediate medical attention call 911.  If you would like help to quit smoking, call 1-800-QUIT-NOW (321) 742-7551) OR Espaol: 1-855-Djelo-Ya (4-174-081-4481) o para ms informacin haga clic aqu or Text READY to 200-400 to register via text  Chelsea Barton - following are the goals we discussed in your visit today:   Goals Addressed   None     Please see education materials related to managing loss and diabetes action plan provided as print materials.   The patient verbalized understanding of instructions provided today and agreed to receive a mailed copy of patient instruction and/or educational materials.  Telephone follow up appointment with Managed Medicaid care management team member scheduled for:06/29/21 @ 3:45pm  [name] RN, BSN [place] RN Care Coordinator   Following is a copy of your plan of care:   Care Plan : RN Care Manager Plan of Care  Updates made by Melissa Montane, RN since 06/14/2021 12:00 AM     Problem: Health Management Needs Related to Wound Care and DMI      Long-Range Goal: Development of Plan of Care Addressing Health Management Needs Related to Wound Care and DMI   Start Date: 05/31/2021  Expected End Date: 08/29/2021  Priority: High  Note:   Current Barriers:  Chronic Disease Management support and education needs related to Wound Care and DMI Chelsea Barton is with recent left transmetatarsal amputation and will be needing supplies for dressing changes. Patient had post op follow up with Dr. Sherryle Lis yesterday. A limited amount of wound supplies were given to patient and paperwork started for assistance with supplies. Chelsea Barton is dealing with the loss of her sister in the last week. Patient has not been contacted to schedule with Endocrinology   RNCM Clinical Goal(s):  Patient will verbalize understanding of plan for management of Wound Care and DMI as evidenced by patient verbalization of self monitoring activities attend all scheduled medical appointments: 06/27/21 with Dr. Sherryle Lis and 06/26/21 with Dr. Delaine Lame as evidenced by provider documentation in EMR        demonstrate improved adherence to prescribed treatment plan for Wound Care and DMI as evidenced by documentation in EMR work with community resource care guide to address needs related to Limited access to food as evidenced by patient and/or community resource care guide support    through collaboration with Consulting civil engineer, provider, and care team.   Interventions: Inter-disciplinary care team collaboration (see longitudinal plan of care) Evaluation of current treatment plan related to  self management and patient's adherence to plan as established by provider Provided information on grieving, offered LCSW referral-patient declined referral   Diabetes:  (Status: Goal on Track (progressing): YES.) Long  Term Goal   Lab Results  Component Value Date   HGBA1C 7.8 (H) 04/10/2021  Assessed patient's understanding of A1c goal: <7% Reviewed medications with patient and discussed importance of medication adherence;        Reviewed prescribed diet with patient diabetic diet, discussed including lean protein to her bedtime snack, examples provided; Counseled on importance of regular laboratory monitoring as prescribed;        Discussed plans with patient for ongoing care management follow up and provided patient with direct contact information for care management team;      Provided patient with written educational materials related to hypo and hyperglycemia and importance of correct treatment;       Reviewed scheduled/upcoming provider appointments including: 1/11 with Dr. Sherryle Lis and 1/10 with Dr. Delaine Lame;         call provider for findings outside established parameters;       Review of patient status, including review of consultants reports, relevant laboratory and other test results, and medications completed;       Assessed social determinant of health barriers;        Collaborate with PCP regarding Endocrinology referral  Wound Care  (Status: Goal on Track (progressing): YES.) Long Term Goal  Evaluation of current treatment plan related to  Wound Care , Limited access to food self-management and patient's adherence to plan as established by provider. Discussed plans with patient for ongoing care management follow up and provided patient with direct contact information for care management team Provided education to patient re: wound care; Reviewed medications with patient and discussed taking all antibiotics as directed; Reviewed scheduled/upcoming provider appointments including 06/26/21 with Dr. Delaine Lame and 1/11 with Dr. Sherryle Lis; Assessed social determinant of health barriers;   Patient Goals/Self-Care Activities: Take medications as prescribed   Attend all scheduled provider  appointments Call pharmacy for medication refills 3-7 days in advance of running out of medications Attend church or other social activities Perform all self care activities independently  Perform IADL's (shopping, preparing meals, housekeeping, managing finances) independently Call provider office for new concerns or questions  take the blood sugar meter to all doctor visits drink 6 to 8 glasses of water each day fill half of plate with vegetables manage portion size

## 2021-06-14 NOTE — Patient Outreach (Signed)
Medicaid Managed Care   Nurse Care Manager Note  06/14/2021 Name:  Chelsea Barton MRN:  [id number] DOB:  December 30, 1960  Chelsea Barton is an 60 y.o. year old female who is a primary patient of Burnard Hawthorne, FNP.  The [facility] Managed Care Coordination team was consulted for assistance with:    DMI Wound care  Chelsea Barton was given information about Medicaid Managed Care Coordination team services today. Chelsea Barton Patient agreed to services and verbal consent obtained.  Engaged with patient by telephone for follow up visit in response to provider referral for case management and/or care coordination services.   Assessments/Interventions:  Review of past medical history, allergies, medications, health status, including review of consultants reports, laboratory and other test data, was performed as part of comprehensive evaluation and provision of chronic care management services.  SDOH (Social Determinants of Health) assessments and interventions performed: SDOH Interventions    Flowsheet Row Most Recent Value  SDOH Interventions   Financial Strain Interventions Other (Comment)  [Care Guide referral has been placed]       Care Plan  No Known Allergies  Medications Reviewed Today     Reviewed by Melissa Montane, RN (Registered Nurse) on 06/14/21 at 56  Med List Status: <None>   Medication Order Taking? Sig Documenting Provider Last Dose Status Informant  amoxicillin-clavulanate (AUGMENTIN) 875-125 MG tablet [id number] Yes Take 1 tablet by mouth 2 (two) times daily. [name], DPM Taking Active   aspirin 81 MG EC tablet [id number] Yes Take 1 tablet (81 mg total) by mouth daily. Swallow whole. Burnard Hawthorne, FNP Taking Active Self  carvedilol (COREG) 12.5 MG tablet [id number] Yes Take 1 tablet (12.5 mg total) by mouth 2 (two) times daily with a meal. Take one tablet by mouth daily with a meal. Burnard Hawthorne, FNP Taking Active Self  Continuous Blood Gluc  Sensor (DEXCOM G6 SENSOR) MISC [id number] Yes Place a new sensor every 10 days. Use to check blood sugar at least 4 times daily Burnard Hawthorne, FNP Taking Active   Continuous Blood Gluc Transmit (DEXCOM G6 TRANSMITTER) MISC [id number] Yes Use to check glucose at least 4 times daily Burnard Hawthorne, FNP Taking Active   dapagliflozin propanediol (FARXIGA) 10 MG TABS tablet [id number] Yes Take 10 mg by mouth daily. [provider] Taking Active Self  digoxin (LANOXIN) 0.125 MG tablet [id number] Yes Take 1 tablet (125 mcg total) by mouth daily. Burnard Hawthorne, FNP Taking Active Self  enalapril (VASOTEC) 20 MG tablet [id number] Yes TAKE 2 TABLETS BY MOUTH DAILY Burnard Hawthorne, FNP Taking Active   hydrALAZINE (APRESOLINE) 10 MG tablet [id number] No TAKE 1/2 TABLET BY MOUTH THREE TIMES DAILY AS NEEDED(TAKE IF BLOOD PRESSURE IS GREATER THAN 140/90)  Patient not taking: Reported on 06/14/2021   Burnard Hawthorne, FNP Not Taking Active Self           Med Note (Clem Wisenbaker A   Thu Jun 14, 2021 10:24 AM) Has not needed  insulin glargine (LANTUS) 100 UNIT/ML Solostar Pen [id number] Yes Inject 40 Units into the skin daily.  [provider] Taking Active Self  Insulin Pen Needle (B-D ULTRAFINE III SHORT PEN) 31G X 8 MM MISC [id number] Yes USE AS DIRECTED Burnard Hawthorne, FNP Taking Active Self  Multiple Vitamin (MULTIVITAMIN) capsule [id number] Yes Take 1 capsule by mouth daily. [provider] Taking Active Self  mupirocin ointment (BACTROBAN) 2 % [id number] No Apply 1 application topically 2 (  two) times daily.  Patient not taking: Reported on 06/14/2021   [name], DPM Not Taking Active   NIFEdipine (PROCARDIA-XL/NIFEDICAL-XL) 30 MG 24 hr tablet [id number] Yes Take 30 mg by mouth daily. [provider] Taking Active Self  NOVOLOG FLEXPEN 100 UNIT/ML FlexPen [id number] Yes Inject 25 Units into the skin 3 (three) times daily with meals. Take 5 units at breakfast,  10 units at lunch and 10 units at dinner. [provider] Taking Active Self  rosuvastatin (CRESTOR) 10 MG tablet [id number] Yes Take 1 tablet (10 mg total) by mouth daily. Burnard Hawthorne, FNP Taking Active Self  Vitamin D, Cholecalciferol, 10 MCG (400 UNIT) CAPS [id number] Yes Take 1 capsule by mouth daily. [provider] Taking Active Self            Patient Active Problem List   Diagnosis Date Noted   PAD (peripheral artery disease) ([place]) 05/09/2021   Myonecrosis ([place])    Osteomyelitis of foot, left, acute ([place]) 04/11/2021   Left foot amputee ([place]) 04/10/2021   Aortic atherosclerosis ([place]) 06/22/2020   Benign hypertensive kidney disease with chronic kidney disease 08/17/2019   Proteinuria 08/17/2019   Secondary hyperparathyroidism of renal origin ([place]) 08/17/2019   Proliferative diabetic retinopathy associated with type 1 diabetes mellitus ([place]) 03/25/2019   Esophageal thickening 03/18/2019   Pain due to onychomycosis of toenails of both feet 12/14/2018   Diabetic neuropathy ([place]) 12/14/2018   Long-term insulin use ([place]) 08/03/2018   CKD (chronic kidney disease) 11/20/2016   CAD (coronary artery disease) 11/20/2016   Mitral valve disease 11/20/2016   Impaired vision 11/20/2016   Moderate mitral insufficiency 03/06/2015   DM type 1 with diabetic peripheral neuropathy ([place]) 03/01/2015   Uncontrolled type 1 diabetes mellitus with hyperglycemia, with long-term current use of insulin ([place]) 03/01/2015   Benign essential hypertension 10/03/2014   Routine general medical examination at a health care facility 12/14/2012   Obesity 12/14/2012   Hypertension 04/01/2011   Hyperlipidemia 04/01/2011   DM type 1 (diabetes mellitus, type 1) ([place]) 04/01/2011    Conditions to be addressed/monitored per PCP order:   DMI and wound care  Care Plan : RN Care Manager Plan of Care  Updates made by Melissa Montane, RN since 06/14/2021 12:00 AM     Problem: Health  Management Needs Related to Wound Care and DMI      Long-Range Goal: Development of Plan of Care Addressing Health Management Needs Related to Wound Care and DMI   Start Date: 05/31/2021  Expected End Date: 08/29/2021  Priority: High  Note:   Current Barriers:  Chronic Disease Management support and education needs related to Wound Care and DMI Chelsea Barton is with recent left transmetatarsal amputation and will be needing supplies for dressing changes. Patient had post op follow up with Dr. Sherryle Lis yesterday. A limited amount of wound supplies were given to patient and paperwork started for assistance with supplies. Chelsea Barton is dealing with the loss of her sister in the last week. Patient has not been contacted to schedule with Endocrinology   RNCM Clinical Goal(s):  Patient will verbalize understanding of plan for management of Wound Care and DMI as evidenced by patient verbalization of self monitoring activities attend all scheduled medical appointments: 06/27/21 with Dr. Sherryle Lis and 06/26/21 with Dr. Delaine Lame as evidenced by provider documentation in EMR        demonstrate improved adherence to prescribed treatment plan for Wound Care and DMI as evidenced by documentation in EMR  work with [employer] care guide to address needs related to Limited access to food as evidenced by patient and/or community resource care guide support    through collaboration with Consulting civil engineer, provider, and care team.   Interventions: Inter-disciplinary care team collaboration (see longitudinal plan of care) Evaluation of current treatment plan related to  self management and patient's adherence to plan as established by provider Provided information on grieving, offered LCSW referral-patient declined referral   Diabetes:  (Status: Goal on Track (progressing): YES.) Long Term Goal   Lab Results  Component Value Date   HGBA1C 7.8 (H) 04/10/2021  Assessed patient's understanding of A1c goal:  <7% Reviewed medications with patient and discussed importance of medication adherence;        Reviewed prescribed diet with patient diabetic diet, discussed including lean protein to her bedtime snack, examples provided; Counseled on importance of regular laboratory monitoring as prescribed;        Discussed plans with patient for ongoing care management follow up and provided patient with direct contact information for care management team;      Provided patient with written educational materials related to hypo and hyperglycemia and importance of correct treatment;       Reviewed scheduled/upcoming provider appointments including: 1/11 with Dr. Sherryle Lis and 1/10 with Dr. Delaine Lame;         call provider for findings outside established parameters;       Review of patient status, including review of consultants reports, relevant laboratory and other test results, and medications completed;       Assessed social determinant of health barriers;        Collaborate with PCP regarding Endocrinology referral  Wound Care  (Status: Goal on Track (progressing): YES.) Long Term Goal  Evaluation of current treatment plan related to  Wound Care , Limited access to food self-management and patient's adherence to plan as established by provider. Discussed plans with patient for ongoing care management follow up and provided patient with direct contact information for care management team Provided education to patient re: wound care; Reviewed medications with patient and discussed taking all antibiotics as directed; Reviewed scheduled/upcoming provider appointments including 06/26/21 with Dr. Delaine Lame and 1/11 with Dr. Sherryle Lis; Assessed social determinant of health barriers;   Patient Goals/Self-Care Activities: Take medications as prescribed   Attend all scheduled provider appointments Call pharmacy for medication refills 3-7 days in advance of running out of medications Attend church or other  social activities Perform all self care activities independently  Perform IADL's (shopping, preparing meals, housekeeping, managing finances) independently Call provider office for new concerns or questions  take the blood sugar meter to all doctor visits drink 6 to 8 glasses of water each day fill half of plate with vegetables manage portion size       Follow Up:  Patient agrees to Care Plan and Follow-up.  Plan: The Managed Medicaid care management team will reach out to the patient again over the next 14 days.  Date/time of next scheduled RN care management/care coordination outreach:  06/29/21 @ 3:45pm  Chelsea Joiner RN, BSN [place] RN Care Coordinator

## 2021-06-15 LAB — CBC WITH DIFFERENTIAL/PLATELET
Basophils Absolute: 0.1 10*3/uL (ref 0.0–0.2)
Basos: 1 %
EOS (ABSOLUTE): 0.5 10*3/uL — ABNORMAL HIGH (ref 0.0–0.4)
Eos: 5 %
Hematocrit: 39 % (ref 34.0–46.6)
Hemoglobin: 12.1 g/dL (ref 11.1–15.9)
Immature Grans (Abs): 0 10*3/uL (ref 0.0–0.1)
Immature Granulocytes: 0 %
Lymphocytes Absolute: 2 10*3/uL (ref 0.7–3.1)
Lymphs: 22 %
MCH: 26.2 pg — ABNORMAL LOW (ref 26.6–33.0)
MCHC: 31 g/dL — ABNORMAL LOW (ref 31.5–35.7)
MCV: 84 fL (ref 79–97)
Monocytes Absolute: 0.8 10*3/uL (ref 0.1–0.9)
Monocytes: 8 %
Neutrophils Absolute: 5.8 10*3/uL (ref 1.4–7.0)
Neutrophils: 64 %
Platelets: 250 10*3/uL (ref 150–450)
RBC: 4.62 x10E6/uL (ref 3.77–5.28)
RDW: 14.1 % (ref 11.7–15.4)
WBC: 9.2 10*3/uL (ref 3.4–10.8)

## 2021-06-15 LAB — SEDIMENTATION RATE: Sed Rate: 107 mm/hr — ABNORMAL HIGH (ref 0–40)

## 2021-06-15 LAB — C-REACTIVE PROTEIN: CRP: 12 mg/L — ABNORMAL HIGH (ref 0–10)

## 2021-06-17 DIAGNOSIS — R531 Weakness: Secondary | ICD-10-CM | POA: Diagnosis not present

## 2021-06-18 NOTE — Progress Notes (Signed)
°  Subjective:  Patient ID: Chelsea Barton, female    DOB: April 29, 1961,  MRN: [id number]  Chief Complaint  Patient presents with   Routine Post Op      Post op--Pt [place] [id number] R effec 12.01.2022    61 y.o. female presents with the above complaint. History confirmed with patient.  She returns for postop follow-up from the hospital from left transmetatarsal amputation and subsequent debridement.   Objective:  Physical Exam: Skin flaps warm and well perfused.  Open ulceration with fiber granular wound bed measuring 7.0 x 4.5 x 1.5 cm left foot, exposed fibrous tendinous structures, today there is exposed metatarsal and the dorsal medial quadrant of the wound no purulence or cellulitis she does have some increased swelling.       Assessment:   1. 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  Ulcer left foot -We discussed the etiology and factors that are a part of the wound healing process.  We also discussed the risk of infection both soft tissue and osteomyelitis from open ulceration.  Discussed the risk of limb loss if this happens or worsens. -Debridement as below. -Dressed with Prisma and DSD -Continue surgical shoe offloading -Seems to have further exposure of the bone at this point.  I am ordering lab work to evaluate for the possibility of persistent osteomyelitis.  If concerning for this she may need revision amputation. -Placed on Augmentin 875/125 twice daily for antibiotic coverage  Procedure: Excisional Debridement of Wound Rationale: Removal of non-viable soft tissue from the wound to promote healing.  Anesthesia: none Post-Debridement Wound Measurements: 7.0 x 4.5 x 1.5 Type of Debridement: Sharp Excisional Tissue Removed: Non-viable soft tissue Depth of Debridement: To level of  bone Technique: Sharp excisional debridement to bleeding, viable wound base.  Dressing: Dry, sterile, compression dressing. Disposition: Patient tolerated procedure well. Patient to return in 2 week for follow-up.  Return in about 2 weeks (around 06/27/2021) for wound care, after lab work to review.

## 2021-06-19 ENCOUNTER — Encounter: Payer: Self-pay | Admitting: Podiatry

## 2021-06-26 ENCOUNTER — Other Ambulatory Visit: Payer: Self-pay

## 2021-06-26 ENCOUNTER — Ambulatory Visit: Payer: Medicaid Other | Attending: Infectious Diseases | Admitting: Infectious Diseases

## 2021-06-26 VITALS — BP 159/86 | HR 66 | Resp 16 | Ht 65.0 in | Wt 190.0 lb

## 2021-06-26 DIAGNOSIS — E11628 Type 2 diabetes mellitus with other skin complications: Secondary | ICD-10-CM | POA: Diagnosis not present

## 2021-06-26 DIAGNOSIS — Z79899 Other long term (current) drug therapy: Secondary | ICD-10-CM | POA: Diagnosis not present

## 2021-06-26 DIAGNOSIS — Z89432 Acquired absence of left foot: Secondary | ICD-10-CM | POA: Diagnosis not present

## 2021-06-26 DIAGNOSIS — I129 Hypertensive chronic kidney disease with stage 1 through stage 4 chronic kidney disease, or unspecified chronic kidney disease: Secondary | ICD-10-CM | POA: Insufficient documentation

## 2021-06-26 DIAGNOSIS — Z7984 Long term (current) use of oral hypoglycemic drugs: Secondary | ICD-10-CM | POA: Diagnosis not present

## 2021-06-26 DIAGNOSIS — E1140 Type 2 diabetes mellitus with diabetic neuropathy, unspecified: Secondary | ICD-10-CM | POA: Diagnosis not present

## 2021-06-26 DIAGNOSIS — L089 Local infection of the skin and subcutaneous tissue, unspecified: Secondary | ICD-10-CM | POA: Diagnosis not present

## 2021-06-26 DIAGNOSIS — Z7902 Long term (current) use of antithrombotics/antiplatelets: Secondary | ICD-10-CM | POA: Diagnosis not present

## 2021-06-26 DIAGNOSIS — I739 Peripheral vascular disease, unspecified: Secondary | ICD-10-CM | POA: Insufficient documentation

## 2021-06-26 DIAGNOSIS — E1122 Type 2 diabetes mellitus with diabetic chronic kidney disease: Secondary | ICD-10-CM | POA: Diagnosis not present

## 2021-06-26 DIAGNOSIS — N189 Chronic kidney disease, unspecified: Secondary | ICD-10-CM | POA: Insufficient documentation

## 2021-06-26 DIAGNOSIS — Z794 Long term (current) use of insulin: Secondary | ICD-10-CM | POA: Insufficient documentation

## 2021-06-26 DIAGNOSIS — Z634 Disappearance and death of family member: Secondary | ICD-10-CM | POA: Diagnosis not present

## 2021-06-26 DIAGNOSIS — Z95820 Peripheral vascular angioplasty status with implants and grafts: Secondary | ICD-10-CM | POA: Diagnosis not present

## 2021-06-26 DIAGNOSIS — E1152 Type 2 diabetes mellitus with diabetic peripheral angiopathy with gangrene: Secondary | ICD-10-CM | POA: Insufficient documentation

## 2021-06-26 NOTE — Patient Instructions (Signed)
You are here for follow up - you have taken 10 weeks of antibiotic and the wound is small at the site of the TMA and much better- follow up with Dr.mcdonald

## 2021-06-26 NOTE — Progress Notes (Signed)
NAMERissie Sculley Barton  DOB: 1960-08-16  MRN: [id number]  Date/Time: 06/26/2021 10:03 AM  Subjective:   Follow up visit- last saw her on 05/24/21  for left foot TMA wound Doing well-completed augmentin last does yesterday- She has been on antibiotics since 04/10/21- until 06/25/21 Chelsea Barton is a 61 y.o. female with a history of Diabetes mellitus,CKD, primary hypertension, is here for follow up   Patient was admitted on 04/10/20[facility] to [facility] with left foot infection and had to undergo TMA.  She initially had second toe amputation but the gangrene incision was progressing.  She underwent TMA on 04/12/2021.  She underwent angio and had a culture atherectomy and stent placement in the left SFA on 04/13/2021 .the proximal margin of the wound was free of osteomyelitis. The culture from the left foot was positive for Prevotella, Streptococcus anginosus.  After getting IV antibiotics in the hospital she was discharged on 04/24/2021 on p.o. Augmentin and also a wound VAC. I saw her on 05/24/21 and she did not have a wound vac due to insurance issues. Wound on 05/24/21 is below- I asked her to continue augmentin adjusted to crcl  She saw Dr.Mcdonald on 06/13/21    She is here today and doing much better Past Medical History:  Diagnosis Date   Diabetes mellitus    Hyperlipidemia    Hypertension     Past Surgical History:  Procedure Laterality Date   AMPUTATION TOE  04/10/2021   Procedure: AMPUTATION 2nd TOE, left foot;  Surgeon: Edrick Kins, DPM;  Location: [facility] ORS;  Service: Podiatry;;   CATARACT EXTRACTION     eye surgery r Right    lense placed   INCISION AND DRAINAGE Left 04/18/2021   Procedure: INCISION AND DRAINAGE;  Surgeon: [name], DPM;  Location: [facility] ORS;  Service: Podiatry;  Laterality: Left;   INCISION AND DRAINAGE OF WOUND Left 04/10/2021   Procedure: IRRIGATION AND DEBRIDEMENT WOUND;  Surgeon: Edrick Kins, DPM;  Location: [facility] ORS;  Service: Podiatry;  Laterality: Left;    PERIPHERAL VASCULAR BALLOON ANGIOPLASTY Left 04/13/2021   Procedure: PERIPHERAL VASCULAR BALLOON ANGIOPLASTY;  Surgeon: Katha Cabal, MD;  Location: [facility];  Service: Cardiovascular;  Laterality: Left;   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   Procedure: TRANSMETATARSAL AMPUTATION;  Surgeon: Felipa Furnace, DPM;  Location: [facility] ORS;  Service: Podiatry;  Laterality: Left;    Social History   Socioeconomic History   Marital status: Single    Spouse name: Not on file   Number of children: Not on file   Years of education: Not on file   Highest education level: Not on file  Occupational History   Not on file  Tobacco Use   Smoking status: Former    Types: Cigarettes    Quit date: 06/18/2005    Years since quitting: 16.0   Smokeless tobacco: Never  Vaping Use   Vaping Use: Never used  Substance and Sexual Activity   Alcohol use: No   Drug use: No   Sexual activity: Not on file  Other Topics Concern   Not on file  Social History Narrative   Has a great niece that she keeps a lot, 62 yo, 'Chelsea Barton'.  Stays weekends with her.       Has 8 great nieces, 3 nieces, 6 nephews   Social Determinants of Radio broadcast assistant Strain: Medium Risk   Difficulty of Paying Living Expenses: Somewhat hard  Food Insecurity: Food Insecurity Present  Worried About Charity fundraiser in the Last Year: Sometimes true   [employer] of Food in the Last Year: Sometimes true  Transportation Needs: No Transportation Needs   Lack of Transportation (Medical): No   Lack of Transportation (Non-Medical): No  Physical Activity: Not on file  Stress: Not on file  Social Connections: Not on file  Intimate Partner Violence: Not on file    Family History  Problem Relation Age of Onset   Diabetes Sister        3/5 sisters have DM   Breast cancer Neg Hx    Colon cancer Neg Hx    No Known Allergies I? Current Outpatient Medications  Medication Sig Dispense Refill    amoxicillin-clavulanate (AUGMENTIN) 875-125 MG tablet Take 1 tablet by mouth 2 (two) times daily. 28 tablet 0   aspirin 81 MG EC tablet Take 1 tablet (81 mg total) by mouth daily. Swallow whole. 30 tablet 12   carvedilol (COREG) 12.5 MG tablet Take 1 tablet (12.5 mg total) by mouth 2 (two) times daily with a meal. Take one tablet by mouth daily with a meal. 180 tablet 1   Continuous Blood Gluc Sensor (DEXCOM G6 SENSOR) MISC Place a new sensor every 10 days. Use to check blood sugar at least 4 times daily 9 each 3   Continuous Blood Gluc Transmit (DEXCOM G6 TRANSMITTER) MISC Use to check glucose at least 4 times daily 1 each 3   dapagliflozin propanediol (FARXIGA) 10 MG TABS tablet Take 10 mg by mouth daily.     digoxin (LANOXIN) 0.125 MG tablet Take 1 tablet (125 mcg total) by mouth daily. 90 tablet 1   enalapril (VASOTEC) 20 MG tablet TAKE 2 TABLETS BY MOUTH DAILY 180 tablet 3   hydrALAZINE (APRESOLINE) 10 MG tablet TAKE 1/2 TABLET BY MOUTH THREE TIMES DAILY AS NEEDED(TAKE IF BLOOD PRESSURE IS GREATER THAN 140/90) 30 tablet 1   insulin glargine (LANTUS) 100 UNIT/ML Solostar Pen Inject 40 Units into the skin daily.      Insulin Pen Needle (B-D ULTRAFINE III SHORT PEN) 31G X 8 MM MISC USE AS DIRECTED 100 each 6   Multiple Vitamin (MULTIVITAMIN) capsule Take 1 capsule by mouth daily.     mupirocin ointment (BACTROBAN) 2 % Apply 1 application topically 2 (two) times daily. 30 g 2   NIFEdipine (PROCARDIA-XL/NIFEDICAL-XL) 30 MG 24 hr tablet Take 30 mg by mouth daily.     NOVOLOG FLEXPEN 100 UNIT/ML FlexPen Inject 25 Units into the skin 3 (three) times daily with meals. Take 5 units at breakfast, 10 units at lunch and 10 units at dinner.     rosuvastatin (CRESTOR) 10 MG tablet Take 1 tablet (10 mg total) by mouth daily. 90 tablet 3   Vitamin D, Cholecalciferol, 10 MCG (400 UNIT) CAPS Take 1 capsule by mouth daily.     No current facility-administered medications for this visit.     Abtx:   Anti-infectives (From admission, onward)    None       REVIEW OF SYSTEMS:  Const: negative fever, negative chills, negative weight loss Eyes: negative diplopia or visual changes, negative eye pain ENT: negative coryza, negative sore throat Resp: negative cough, hemoptysis, dyspnea Cards: negative for chest pain, palpitations, lower extremity edema GU: negative for frequency, dysuria and hematuria GI: Negative for abdominal pain, diarrhea, bleeding, constipation Skin: negative for rash and pruritus Heme: negative for easy bruising and gum/nose bleeding MS: Weakness. Neurolo:negative for headaches, dizziness, vertigo, memory problems  Psych:  sister died day after christmas and she is sad Endocrine:, diabetes Allergy/Immunology- negative for any medication or food allergies ? Objective:  VITALS: BP (!) 159/86    Pulse 66    Resp 16    Ht 5\' 5"  (1.651 m)    Wt 190 lb (86.2 kg)    SpO2 94%    BMI 31.62 kg/m    PHYSICAL EXAM:  General: Alert, cooperative, no distress, appears stated age.  Head: Normocephalic, without obvious abnormality, atraumatic. Eyes: Conjunctivae clear, anicteric sclerae. Pupils are equal ENT Nares normal. No drainage or sinus tenderness. Lips, mucosa, and tongue normal. No Thrush, full set of dentures Neck: Supple, symmetrical, no adenopathy, thyroid: non tender no carotid bruit and no JVD. Back: No CVA tenderness. Lungs: Clear to auscultation bilaterally. No Wheezing or Rhonchi. No rales. Heart: Regular rate and rhythm, no murmur, rub or gallop. Abdomen: Soft, non-tender,not distended. Bowel sounds normal. No masses Extremities: wound is very superficial not infected- may need a slin graft     05/24/21     11/17        Skin: No rashes or lesions. Or bruising Lymph: Cervical, supraclavicular normal. Neurologic: Grossly non-focal Pertinent Labs Lab Results CBC    Component Value Date/Time   WBC 9.2 06/14/2021 1307   WBC 7.7 05/08/2021  1021   RBC 4.62 06/14/2021 1307   RBC 4.43 05/08/2021 1021   HGB 12.1 06/14/2021 1307   HCT 39.0 06/14/2021 1307   PLT 250 06/14/2021 1307   MCV 84 06/14/2021 1307   MCH 26.2 (L) 06/14/2021 1307   MCH 26.3 04/24/2021 0654   MCHC 31.0 (L) 06/14/2021 1307   MCHC 30.8 05/08/2021 1021   RDW 14.1 06/14/2021 1307   LYMPHSABS 2.0 06/14/2021 1307   MONOABS 0.8 05/08/2021 1021   EOSABS 0.5 (H) 06/14/2021 1307   BASOSABS 0.1 06/14/2021 1307    CMP Latest Ref Rng & Units 05/24/2021 05/08/2021 04/24/2021  Glucose 70 - 99 mg/dL 153(H) 189(H) 206(H)  BUN 6 - 23 mg/dL 31(H) 32(H) 24(H)  Creatinine 0.40 - 1.20 mg/dL 2.17(H) 2.34(H) 1.80(H)  Sodium 135 - 145 mEq/L 140 139 139  Potassium 3.5 - 5.1 mEq/L 4.7 4.8 4.1  Chloride 96 - 112 mEq/L 103 101 105  CO2 19 - 32 mEq/L 30 31 27   Calcium 8.4 - 10.5 mg/dL 9.3 9.7 8.7(L)  Total Protein 6.0 - 8.3 g/dL - 7.2 -  Total Bilirubin 0.2 - 1.2 mg/dL - 0.4 -  Alkaline Phos 39 - 117 U/L - 84 -  AST 0 - 37 U/L - 16 -  ALT 0 - 35 U/L - 14 -    ? 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  Initially had second toe amputation followed by transmetatarsal amputation.  This is complicated by PAD and neuropathy.  She initially got Unasyn and then was switched over to p.o. Augmentin and has taken a total of 10 weeks of antibiotic She does not need any further antibiotic  The wound is much smaller  , not infected-  Will be seeing Dr.Mcdonald tomorrow  Peripheral arterial disease.  crosser atherectomy and stent placement in left SFA on 04/13/2021 On Plavix ? Diabetes mellitus on insulin and Farxiga.  Hypertension on amlodipine  Discussed with patient in great detail.  Will discuss with Dr.Mcdonald Follow PRN

## 2021-06-27 ENCOUNTER — Encounter: Payer: Self-pay | Admitting: Podiatry

## 2021-06-27 ENCOUNTER — Ambulatory Visit (INDEPENDENT_AMBULATORY_CARE_PROVIDER_SITE_OTHER): Payer: Medicaid Other | Admitting: Podiatry

## 2021-06-27 DIAGNOSIS — M79675 Pain in left toe(s): Secondary | ICD-10-CM

## 2021-06-27 DIAGNOSIS — I739 Peripheral vascular disease, unspecified: Secondary | ICD-10-CM

## 2021-06-27 DIAGNOSIS — T148XXA Other injury of unspecified body region, initial encounter: Secondary | ICD-10-CM

## 2021-06-27 DIAGNOSIS — B351 Tinea unguium: Secondary | ICD-10-CM | POA: Diagnosis not present

## 2021-06-27 DIAGNOSIS — L97426 Non-pressure chronic ulcer of left heel and midfoot with bone involvement without evidence of necrosis: Secondary | ICD-10-CM | POA: Diagnosis not present

## 2021-06-27 DIAGNOSIS — E13621 Other specified diabetes mellitus with foot ulcer: Secondary | ICD-10-CM

## 2021-06-27 NOTE — Patient Instructions (Signed)
Call the vascular doctor at  [phone] to have them check out the circulation on the right side

## 2021-06-29 ENCOUNTER — Other Ambulatory Visit: Payer: Self-pay | Admitting: *Deleted

## 2021-06-29 NOTE — Progress Notes (Signed)
°  Subjective:  Patient ID: Chelsea Barton, female    DOB: [birth year],  MRN: [id number]  Chief Complaint  Patient presents with   Wound Check   Routine Post Op    "Its doing a lot better. It throbs every now and then"    61 y.o. female presents with the above complaint. History confirmed with patient.  She returns for postop follow-up from the hospital from left transmetatarsal amputation and subsequent debridement.  The nails are thick and painful on the right side  Objective:  Physical Exam: Skin flaps warm and well perfused.  Open ulceration with fiber granular wound bed measuring 5.0 x 2.5 x 0.8 cm left foot, fibrogranular wound bed, on the right foot she has hypertrophic thickened elongated toenails with yellow discoloration and subungual debris as well as a new abrasion on the tips of the fourth and third toes with blood blistering no gangrene signs of infection purulence or malodor is noted        Assessment:   1. 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 Pain due to onychomycosis of toenails of both feet   3. Blood blister   4. PAD (peripheral artery disease) ([place])      Plan:  Patient was evaluated and treated and all questions answered.  Ulcer left foot -We discussed the etiology and factors that are a part of the wound healing process.  We also discussed the risk of infection both soft tissue and osteomyelitis from open ulceration.  Discussed the risk of limb loss if this happens or worsens. -Debridement as below. -Dressed with Prisma and DSD.  Continue this at home-Continue surgical shoe offloading -No exposed bone today.  Tissue is growing over it appropriately.  Procedure: Excisional Debridement of Wound Rationale: Removal of non-viable soft tissue from the wound to promote healing.  Anesthesia: none Post-Debridement Wound Measurements: 5.0 x 2.5 x 0.8 cm Type of  Debridement: Sharp Excisional Tissue Removed: Non-viable soft tissue Depth of Debridement: To level of bone Technique: Sharp excisional debridement to bleeding, viable wound base.  Dressing: Dry, sterile, compression dressing. Disposition: Patient tolerated procedure well. Patient to return in 2 week for follow-up.   Discussed the etiology and treatment options for the condition in detail with the patient. Educated patient on the topical and oral treatment options for mycotic nails. Recommended debridement of the nails today. Sharp and mechanical debridement performed of all painful and mycotic nails today. Nails debrided in length and thickness using a nail nipper to level of comfort. Discussed treatment options including appropriate shoe gear. Follow up as needed for painful nails.   She also is a new issue of blood blister on the right foot.  I reviewed her last arterial testing on the right foot.  Her right leg was not evaluated with angiography when she had her left foot infection.  I will have her see her vascular surgeon again soon to have this evaluated to ensure that this has not progressed to an ulceration. Return in about 2 weeks (around 07/11/2021) for wound care.

## 2021-06-29 NOTE — Patient Instructions (Signed)
Visit Information  [name]  - as a part of your Medicaid benefit, you are eligible for care management and care coordination services at no cost or copay. I was unable to reach you by phone today but would be happy to help you with your health related needs. Please feel free to call me @ [phone].   A member of the Managed Medicaid care management team will reach out to you again over the next 14 days.   [name] RN, BSN [place] RN Care Coordinator

## 2021-06-29 NOTE — Patient Outreach (Signed)
Care Coordination  06/29/2021  [name] 12-Jun-1961 [id number]   Medicaid Managed Care   Unsuccessful Outreach Note  06/29/2021 Name: Chelsea Barton MRN: [id number] DOB: Aug 20, 1960  Referred by: Burnard Hawthorne, FNP Reason for referral : High Risk Managed Medicaid (Unsuccessful RNCM follow up outreach)   An unsuccessful telephone outreach was attempted today. The patient was referred to the case management team for assistance with care management and care coordination.   Follow Up Plan: A HIPAA compliant phone message was left for the patient providing contact information and requesting a return call.   [name] RN, BSN [place] RN Care Coordinator

## 2021-07-03 ENCOUNTER — Ambulatory Visit (INDEPENDENT_AMBULATORY_CARE_PROVIDER_SITE_OTHER): Payer: Medicaid Other | Admitting: Nurse Practitioner

## 2021-07-03 ENCOUNTER — Other Ambulatory Visit: Payer: Self-pay

## 2021-07-03 ENCOUNTER — Ambulatory Visit (INDEPENDENT_AMBULATORY_CARE_PROVIDER_SITE_OTHER): Payer: Medicaid Other

## 2021-07-03 ENCOUNTER — Other Ambulatory Visit (INDEPENDENT_AMBULATORY_CARE_PROVIDER_SITE_OTHER): Payer: Self-pay | Admitting: Nurse Practitioner

## 2021-07-03 ENCOUNTER — Encounter (INDEPENDENT_AMBULATORY_CARE_PROVIDER_SITE_OTHER): Payer: Self-pay | Admitting: Nurse Practitioner

## 2021-07-03 VITALS — BP 116/68 | HR 67 | Resp 16 | Ht 65.0 in | Wt 186.0 lb

## 2021-07-03 DIAGNOSIS — Z9889 Other specified postprocedural states: Secondary | ICD-10-CM

## 2021-07-03 DIAGNOSIS — I739 Peripheral vascular disease, unspecified: Secondary | ICD-10-CM

## 2021-07-03 DIAGNOSIS — E785 Hyperlipidemia, unspecified: Secondary | ICD-10-CM | POA: Diagnosis not present

## 2021-07-03 DIAGNOSIS — I10 Essential (primary) hypertension: Secondary | ICD-10-CM

## 2021-07-04 ENCOUNTER — Other Ambulatory Visit: Payer: Self-pay | Admitting: *Deleted

## 2021-07-04 NOTE — Patient Outreach (Signed)
Medicaid Managed Care   Nurse Care Manager Note  07/04/2021 Name:  Chelsea Barton MRN:  [id number] DOB:  04-Nov-1960  Chelsea Barton is an 61 y.o. year old female who is a primary patient of Burnard Hawthorne, FNP.  The [facility] Managed Care Coordination team was consulted for assistance with:    Recent amputee DMII  Chelsea Barton was given information about Medicaid Managed Care Coordination team services today. Chelsea Barton Patient agreed to services and verbal consent obtained.  Engaged with patient by telephone for follow up visit in response to provider referral for case management and/or care coordination services.   Assessments/Interventions:  Review of past medical history, allergies, medications, health status, including review of consultants reports, laboratory and other test data, was performed as part of comprehensive evaluation and provision of chronic care management services.  SDOH (Social Determinants of Health) assessments and interventions performed: SDOH Interventions    Flowsheet Row Most Recent Value  SDOH Interventions   Physical Activity Interventions Intervention Not Indicated       Care Plan  No Known Allergies  Medications Reviewed Today     Reviewed by Melissa Montane, RN (Registered Nurse) on 07/04/21 at 19  Med List Status: <None>   Medication Order Taking? Sig Documenting Provider Last Dose Status Informant  amoxicillin-clavulanate (AUGMENTIN) 875-125 MG tablet [id number] No Take 1 tablet by mouth 2 (two) times daily.  Patient not taking: Reported on 07/03/2021   [name], DPM Not Taking Active   aspirin 81 MG EC tablet [id number] Yes Take 1 tablet (81 mg total) by mouth daily. Swallow whole. Burnard Hawthorne, FNP Taking Active Self  carvedilol (COREG) 12.5 MG tablet [id number] Yes Take 1 tablet (12.5 mg total) by mouth 2 (two) times daily with a meal. Take one tablet by mouth daily with a meal. Burnard Hawthorne, FNP Taking Active Self   Continuous Blood Gluc Sensor (DEXCOM G6 SENSOR) MISC [id number] Yes Place a new sensor every 10 days. Use to check blood sugar at least 4 times daily Burnard Hawthorne, FNP Taking Active   Continuous Blood Gluc Transmit (DEXCOM G6 TRANSMITTER) MISC [id number] Yes Use to check glucose at least 4 times daily Burnard Hawthorne, FNP Taking Active   dapagliflozin propanediol (FARXIGA) 10 MG TABS tablet [id number] Yes Take 10 mg by mouth daily. [provider] Taking Active Self  digoxin (LANOXIN) 0.125 MG tablet [id number] Yes Take 1 tablet (125 mcg total) by mouth daily. Burnard Hawthorne, FNP Taking Active Self  enalapril (VASOTEC) 20 MG tablet [id number] Yes TAKE 2 TABLETS BY MOUTH DAILY Burnard Hawthorne, FNP Taking Active   hydrALAZINE (APRESOLINE) 10 MG tablet [id number] Yes TAKE 1/2 TABLET BY MOUTH THREE TIMES DAILY AS NEEDED(TAKE IF BLOOD PRESSURE IS GREATER THAN 140/90) Burnard Hawthorne, FNP Taking Active Self           Med Note (Sharayah Renfrow A   Thu Jun 14, 2021 10:24 AM) Has not needed  insulin glargine (LANTUS) 100 UNIT/ML Solostar Pen [id number] Yes Inject 40 Units into the skin daily.  [provider] Taking Active Self  Insulin Pen Needle (B-D ULTRAFINE III SHORT PEN) 31G X 8 MM MISC [id number] Yes USE AS DIRECTED Burnard Hawthorne, FNP Taking Active Self  Multiple Vitamin (MULTIVITAMIN) capsule [id number] Yes Take 1 capsule by mouth daily. [provider] Taking Active Self  mupirocin ointment (BACTROBAN) 2 % [id number] No Apply 1 application topically 2 (two) times daily.  Patient not  taking: Reported on 07/03/2021   [name], DPM Not Taking Active   NIFEdipine (PROCARDIA-XL/NIFEDICAL-XL) 30 MG 24 hr tablet [id number] Yes Take 30 mg by mouth daily. [provider] Taking Active Self  NOVOLOG FLEXPEN 100 UNIT/ML FlexPen [id number] Yes Inject 25 Units into the skin 3 (three) times daily with meals. Take 5 units at breakfast, 10 units at lunch and 10  units at dinner. [provider] Taking Active Self  rosuvastatin (CRESTOR) 10 MG tablet [id number] No Take 1 tablet (10 mg total) by mouth daily.  Patient not taking: Reported on 07/04/2021   Burnard Hawthorne, FNP Not Taking Active Self  Vitamin D, Cholecalciferol, 10 MCG (400 UNIT) CAPS [id number] Yes Take 1 capsule by mouth daily. [provider] Taking Active Self            Patient Active Problem List   Diagnosis Date Noted   PAD (peripheral artery disease) ([place]) 05/09/2021   Myonecrosis ([place])    Osteomyelitis of foot, left, acute ([place]) 04/11/2021   Left foot amputee ([place]) 04/10/2021   LVH (left ventricular hypertrophy) due to hypertensive disease, without heart failure 09/04/2020   Chronic kidney disease, stage IV (severe) ([place]) 08/15/2020   Aortic atherosclerosis ([place]) 06/22/2020   Benign hypertensive kidney disease with chronic kidney disease 08/17/2019   Proteinuria 08/17/2019   Secondary hyperparathyroidism of renal origin ([place]) 08/17/2019   Proliferative diabetic retinopathy associated with type 1 diabetes mellitus ([place]) 03/25/2019   Esophageal thickening 03/18/2019   Pain due to onychomycosis of toenails of both feet 12/14/2018   Diabetic neuropathy ([place]) 12/14/2018   Long-term insulin use ([place]) 08/03/2018   CKD (chronic kidney disease) 11/20/2016   CAD (coronary artery disease) 11/20/2016   Mitral valve disease 11/20/2016   Impaired vision 11/20/2016   Moderate mitral insufficiency 03/06/2015   DM type 1 with diabetic peripheral neuropathy ([place]) 03/01/2015   Uncontrolled type 1 diabetes mellitus with hyperglycemia, with long-term current use of insulin ([place]) 03/01/2015   Benign essential hypertension 10/03/2014   Routine general medical examination at a health care facility 12/14/2012   Obesity 12/14/2012   Hypertension 04/01/2011   Hyperlipidemia 04/01/2011   DM type 1 (diabetes mellitus, type 1) ([place]) 04/01/2011    Conditions to be  addressed/monitored per PCP order:  DMII and recent amputee  Care Plan : RN Care Manager Plan of Care  Updates made by Melissa Montane, RN since 07/04/2021 12:00 AM     Problem: Health Management Needs Related to Wound Care and DMI      Long-Range Goal: Development of Plan of Care Addressing Health Management Needs Related to Wound Care and DMI   Start Date: 05/31/2021  Expected End Date: 08/29/2021  Priority: High  Note:   Current Barriers:  Chronic Disease Management support and education needs related to Wound Care and DMI Chelsea Barton is with recent left transmetatarsal amputation. She attends her post op appointments, and reports good healing. Chelsea Barton continues to wait for scheduling with new Endocrinologist. She is using a CGM and reports good readings 80-120, with occasional higher readings after dinner.   RNCM Clinical Goal(s):  Patient will verbalize understanding of plan for management of Wound Care and DMI as evidenced by patient verbalization of self monitoring activities attend all scheduled medical appointments: 1/25 with Dr. Sherryle Lis and 1/25 with PCP as evidenced by provider documentation in EMR        demonstrate improved adherence to prescribed treatment plan for Wound Care and DMI as evidenced  by documentation in EMR work with community resource care guide to address needs related to Limited access to food as evidenced by patient and/or community resource care guide support    through collaboration with Consulting civil engineer, provider, and care team.   Interventions: Inter-disciplinary care team collaboration (see longitudinal plan of care) Evaluation of current treatment plan related to  self management and patient's adherence to plan as established by provider Discussed future appointments on 8/75 and conflicting times, advised patient to reschedule one of these appointments   Diabetes:  (Status: Goal on Track (progressing): YES.) Long Term Goal   Lab Results  Component  Value Date   HGBA1C 7.8 (H) 04/10/2021  Assessed patient's understanding of A1c goal: <7% Reviewed medications with patient and discussed importance of medication adherence;        Reviewed prescribed diet with patient -discussed typical evening meal, suggestions made to help improve evening readings; Discussed plans with patient for ongoing care management follow up and provided patient with direct contact information for care management team;      Reviewed scheduled/upcoming provider appointments including: 1/25 with Dr. Sherryle Lis and PCP;         call provider for findings outside established parameters;       Review of patient status, including review of consultants reports, relevant laboratory and other test results, and medications completed;       Assessed social determinant of health barriers;        Provided education on managing diabetes  Wound Care  (Status: Goal on Track (progressing): YES.) Long Term Goal  Evaluation of current treatment plan related to  Wound Care , Limited access to food self-management and patient's adherence to plan as established by provider. Discussed plans with patient for ongoing care management follow up and provided patient with direct contact information for care management team Provided education to patient re: wound care; Reviewed medications with patient and discussed taking all antibiotics as directed; Reviewed scheduled/upcoming provider appointments including 07/11/21 with Dr. Sherryle Lis and PCP; Assessed social determinant of health barriers;   Patient Goals/Self-Care Activities: Take medications as prescribed   Attend all scheduled provider appointments Call pharmacy for medication refills 3-7 days in advance of running out of medications Attend church or other social activities Perform all self care activities independently  Perform IADL's (shopping, preparing meals, housekeeping, managing finances) independently Call provider office for new  concerns or questions  take the blood sugar meter to all doctor visits drink 6 to 8 glasses of water each day fill half of plate with vegetables manage portion size       Follow Up:  Patient agrees to Care Plan and Follow-up.  Plan: The Managed Medicaid care management team will reach out to the patient again over the next 30 days.  Date/time of next scheduled RN care management/care coordination outreach:  08/02/20 @ 11:15am  [name] RN, [place] RN Care Coordinator

## 2021-07-04 NOTE — Patient Instructions (Signed)
Visit Information  Ms. Farrier was given information about Medicaid Managed Care team care coordination services as a part of their [place] Medicaid benefit. Guenevere D Files verbally consented to engagement with the [facility] Managed Care team.   If you are experiencing a medical emergency, please call 911 or report to your local emergency department or urgent care.   If you have a non-emergency medical problem during routine business hours, please contact your provider's office and ask to speak with a nurse.   For questions related to your [facility], please call: [phone] or visit the homepage here: [email or web address]  If you would like to schedule transportation through your [facility], please call the following number at least 2 days in advance of your appointment: [phone].   Call the [place] at [phone], at any time, 24 hours a day, 7 days a week. If you are in danger or need immediate medical attention call 911.  If you would like help to quit smoking, call 1-800-QUIT-NOW 716 200 0939) OR Espaol: 1-855-Djelo-Ya (4-431-540-0867) o para ms informacin haga clic aqu or Text READY to 200-400 to register via text  Ms. Ates - following are the goals we discussed in your visit today:   Goals Addressed   None     Please see education materials related to wounds and diabetes provided by MyChart link.  The patient has access to MyChart and can view provided education  Telephone follow up appointment with Managed Medicaid care management team member scheduled for:08/02/21 @ 11:15am  [name] RN, BSN [place] RN Care Coordinator   Following is a copy of your plan of care:  Care Plan : RN Care Manager Plan of Care  Updates made by Melissa Montane, RN since 07/04/2021 12:00 AM      Problem: Health Management Needs Related to Wound Care and DMI      Long-Range Goal: Development of Plan of Care Addressing Health Management Needs Related to Wound Care and DMI   Start Date: 05/31/2021  Expected End Date: 08/29/2021  Priority: High  Note:   Current Barriers:  Chronic Disease Management support and education needs related to Wound Care and DMI Ms. Almyra Deforest is with recent left transmetatarsal amputation. She attends her post op appointments, and reports good healing. Ms. Almyra Deforest continues to wait for scheduling with new Endocrinologist. She is using a CGM and reports good readings 80-120, with occasional higher readings after dinner.   RNCM Clinical Goal(s):  Patient will verbalize understanding of plan for management of Wound Care and DMI as evidenced by patient verbalization of self monitoring activities attend all scheduled medical appointments: 1/25 with Dr. Sherryle Lis and 1/25 with PCP as evidenced by provider documentation in EMR        demonstrate improved adherence to prescribed treatment plan for Wound Care and DMI as evidenced by documentation in EMR work with community resource care guide to address needs related to Limited access to food as evidenced by patient and/or community resource care guide support    through collaboration with Consulting civil engineer, provider, and care team.   Interventions: Inter-disciplinary care team collaboration (see longitudinal plan of care) Evaluation of current treatment plan related to  self management and patient's adherence to plan as established by provider Discussed future appointments on 6/19 and conflicting times, advised patient to reschedule one of these appointments   Diabetes:  (Status: Goal on Track (progressing): YES.)  Long Term Goal   Lab Results  Component Value Date   HGBA1C 7.8 (H) 04/10/2021  Assessed patient's understanding of A1c goal: <7% Reviewed medications with patient and discussed importance of medication  adherence;        Reviewed prescribed diet with patient -discussed typical evening meal, suggestions made to help improve evening readings; Discussed plans with patient for ongoing care management follow up and provided patient with direct contact information for care management team;      Reviewed scheduled/upcoming provider appointments including: 1/25 with Dr. Sherryle Lis and PCP;         call provider for findings outside established parameters;       Review of patient status, including review of consultants reports, relevant laboratory and other test results, and medications completed;       Assessed social determinant of health barriers;        Provided education on managing diabetes  Wound Care  (Status: Goal on Track (progressing): YES.) Long Term Goal  Evaluation of current treatment plan related to  Wound Care , Limited access to food self-management and patient's adherence to plan as established by provider. Discussed plans with patient for ongoing care management follow up and provided patient with direct contact information for care management team Provided education to patient re: wound care; Reviewed medications with patient and discussed taking all antibiotics as directed; Reviewed scheduled/upcoming provider appointments including 07/11/21 with Dr. Sherryle Lis and PCP; Assessed social determinant of health barriers;   Patient Goals/Self-Care Activities: Take medications as prescribed   Attend all scheduled provider appointments Call pharmacy for medication refills 3-7 days in advance of running out of medications Attend church or other social activities Perform all self care activities independently  Perform IADL's (shopping, preparing meals, housekeeping, managing finances) independently Call provider office for new concerns or questions  take the blood sugar meter to all doctor visits drink 6 to 8 glasses of water each day fill half of plate with vegetables manage portion  size

## 2021-07-09 ENCOUNTER — Encounter: Payer: Self-pay | Admitting: Podiatry

## 2021-07-09 ENCOUNTER — Ambulatory Visit (INDEPENDENT_AMBULATORY_CARE_PROVIDER_SITE_OTHER): Payer: Medicaid Other | Admitting: Podiatry

## 2021-07-09 ENCOUNTER — Other Ambulatory Visit: Payer: Self-pay

## 2021-07-09 DIAGNOSIS — E13621 Other specified diabetes mellitus with foot ulcer: Secondary | ICD-10-CM

## 2021-07-09 DIAGNOSIS — L97422 Non-pressure chronic ulcer of left heel and midfoot with fat layer exposed: Secondary | ICD-10-CM

## 2021-07-09 NOTE — Progress Notes (Signed)
°  Subjective:  Patient ID: Chelsea Barton, female    DOB: 09-08-1960,  MRN: [id number]  Chief Complaint  Patient presents with   Wound Check    "Its doing ok"    61 y.o. female presents with the above complaint. History confirmed with patient.  Denies fevers chills nausea vomiting  Objective:  Physical Exam: Skin flaps warm and well perfused.  Open ulceration with fiber granular wound bed measuring 4.5 x 3.5 x 0.6 cm left foot, fibrogranular wound bed, no gangrene signs of infection purulence or malodor is noted         Assessment:   1. 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  Ulcer left foot -We discussed the etiology and factors that are a part of the wound healing process.  We also discussed the risk of infection both soft tissue and osteomyelitis from open ulceration.  Discussed the risk of limb loss if this happens or worsens. -Debridement as below. -Dressed with Prisma and DSD.  Continue this at home.  Dressing supplies given today -Continue surgical shoe offloading -So far responding well to Prisma  Procedure: Excisional Debridement of Wound Rationale: Removal of non-viable soft tissue from the wound to promote healing.  Anesthesia: none Post-Debridement Wound Measurements: 4.5 x 3.5 x 0.6 Type of Debridement: Sharp Excisional Tissue Removed: Non-viable soft tissue Depth of Debridement: To level of bone Technique: Sharp excisional debridement to bleeding, viable wound base.  Dressing: Dry, sterile, compression dressing. Disposition: Patient tolerated procedure well. Patient to return in 2 week for follow-up.   Right foot is doing okay she saw vascular surgery and had testing done and there were no major blockages that require surgical intervention at this point we will continue to monitor   Return in about 2 weeks  (around 07/23/2021) for wound care.

## 2021-07-11 ENCOUNTER — Ambulatory Visit (INDEPENDENT_AMBULATORY_CARE_PROVIDER_SITE_OTHER): Payer: Medicaid Other | Admitting: Family

## 2021-07-11 ENCOUNTER — Other Ambulatory Visit (HOSPITAL_COMMUNITY)
Admission: RE | Admit: 2021-07-11 | Discharge: 2021-07-11 | Disposition: A | Discharge status: Home / Self Care | Payer: Medicaid Other | Source: Ambulatory Visit | Attending: Family | Admitting: Family

## 2021-07-11 ENCOUNTER — Other Ambulatory Visit: Payer: Self-pay

## 2021-07-11 ENCOUNTER — Telehealth: Payer: Self-pay | Admitting: Family

## 2021-07-11 ENCOUNTER — Encounter: Payer: Self-pay | Admitting: Family

## 2021-07-11 ENCOUNTER — Encounter: Payer: Medicaid Other | Admitting: Podiatry

## 2021-07-11 VITALS — BP 142/74 | HR 64 | Temp 97.9°F | Ht 65.0 in | Wt 188.8 lb

## 2021-07-11 DIAGNOSIS — E785 Hyperlipidemia, unspecified: Secondary | ICD-10-CM

## 2021-07-11 DIAGNOSIS — Z23 Encounter for immunization: Secondary | ICD-10-CM | POA: Diagnosis not present

## 2021-07-11 DIAGNOSIS — Z0000 Encounter for general adult medical examination without abnormal findings: Secondary | ICD-10-CM | POA: Insufficient documentation

## 2021-07-11 DIAGNOSIS — I10 Essential (primary) hypertension: Secondary | ICD-10-CM

## 2021-07-11 DIAGNOSIS — Z1211 Encounter for screening for malignant neoplasm of colon: Secondary | ICD-10-CM

## 2021-07-11 DIAGNOSIS — I119 Hypertensive heart disease without heart failure: Secondary | ICD-10-CM | POA: Diagnosis not present

## 2021-07-11 DIAGNOSIS — I2510 Atherosclerotic heart disease of native coronary artery without angina pectoris: Secondary | ICD-10-CM

## 2021-07-11 DIAGNOSIS — E1042 Type 1 diabetes mellitus with diabetic polyneuropathy: Secondary | ICD-10-CM | POA: Diagnosis not present

## 2021-07-11 DIAGNOSIS — I700 Atherosclerosis of aorta: Secondary | ICD-10-CM

## 2021-07-11 DIAGNOSIS — E1039 Type 1 diabetes mellitus with other diabetic ophthalmic complication: Secondary | ICD-10-CM

## 2021-07-11 DIAGNOSIS — I059 Rheumatic mitral valve disease, unspecified: Secondary | ICD-10-CM

## 2021-07-11 LAB — HEMOGLOBIN A1C: Hgb A1c MFr Bld: 7.4 % — ABNORMAL HIGH (ref 4.6–6.5)

## 2021-07-11 LAB — MICROALBUMIN / CREATININE URINE RATIO
Creatinine,U: 55.7 mg/dL
Microalb Creat Ratio: 75.1 mg/g — ABNORMAL HIGH (ref 0.0–30.0)
Microalb, Ur: 41.9 mg/dL — ABNORMAL HIGH (ref 0.0–1.9)

## 2021-07-11 LAB — TSH: TSH: 1.46 u[IU]/mL (ref 0.35–5.50)

## 2021-07-11 LAB — VITAMIN D 25 HYDROXY (VIT D DEFICIENCY, FRACTURES): VITD: 37.43 ng/mL (ref 30.00–100.00)

## 2021-07-11 MED ORDER — INSULIN GLARGINE 100 UNIT/ML SOLOSTAR PEN: 40.0000 [IU] | PEN_INJECTOR | Freq: Every day | SUBCUTANEOUS | 3 refills | Status: DC

## 2021-07-11 MED ORDER — ROSUVASTATIN CALCIUM 10 MG PO TABS: 10.0000 mg | ORAL_TABLET | Freq: Every day | ORAL | 3 refills | Status: DC

## 2021-07-11 MED ORDER — NOVOLOG FLEXPEN 100 UNIT/ML ~~LOC~~ SOPN: 25.0000 [IU] | PEN_INJECTOR | Freq: Three times a day (TID) | SUBCUTANEOUS | 3 refills | Status: DC

## 2021-07-11 MED ORDER — HYDRALAZINE HCL 10 MG PO TABS: ORAL_TABLET | ORAL | 1 refills | Status: DC

## 2021-07-11 MED ORDER — DEXCOM G6 SENSOR MISC: 3 refills | Status: DC

## 2021-07-11 MED ORDER — DEXCOM G6 TRANSMITTER MISC: 3 refills | Status: DC

## 2021-07-11 MED ORDER — CARVEDILOL 12.5 MG PO TABS: 12.5000 mg | ORAL_TABLET | Freq: Two times a day (BID) | ORAL | 1 refills | Status: DC

## 2021-07-11 MED ORDER — NIFEDIPINE ER OSMOTIC RELEASE 30 MG PO TB24: 30.0000 mg | ORAL_TABLET | Freq: Every day | ORAL | 12 refills | Status: DC

## 2021-07-11 MED ORDER — ENALAPRIL MALEATE 20 MG PO TABS: 40.0000 mg | ORAL_TABLET | Freq: Every day | ORAL | 3 refills | Status: DC

## 2021-07-11 NOTE — Telephone Encounter (Signed)
Chelsea Barton,  Can you help with endocrine referral? Pt got call in 05/25/21 and they couldn't reach her What is name and number of group who called her so we give her the information to schedule.  Thanks  Carlis Stable

## 2021-07-11 NOTE — Assessment & Plan Note (Signed)
Clinical breast exam and mammogram performed today.  Patient declines screening for colon cancer either with colonoscopy or Cologuard.  Encouraged continued exercise

## 2021-07-11 NOTE — Telephone Encounter (Signed)
Please call and sch an appt for pt to est care with [facility] endo Thank you!

## 2021-07-11 NOTE — Telephone Encounter (Signed)
I was able to call endocrinology to schedule patient an appointment. She is scheduled at 11:10 on 07/20/21. Pt is concerned that she will not have a ride but plans to ask her brother if he is able to take her. I asked patient to please let us know & I let her know she would receive a packet in the mail from their office.

## 2021-07-11 NOTE — Patient Instructions (Signed)
Please check ALL medications at home and let me know which medication you are missing so we can refill. Please bring your medications with you to your next appointment  Referral to cardiology and I am also investigating endocrine referral placed last month.  Let us know if you dont hear back within a week in regards to an appointment being scheduled.    Nice to see you!   Health Maintenance for Postmenopausal Women Menopause is a normal process in which your ability to get pregnant comes to an end. This process happens slowly over many months or years, usually between the ages of 50 and 62. Menopause is complete when you have missed your menstrual period for 12 months. It is important to talk with your health care provider about some of the most common conditions that affect women after menopause (postmenopausal women). These include heart disease, cancer, and bone loss (osteoporosis). Adopting a healthy lifestyle and getting preventive care can help to promote your health and wellness. The actions you take can also lower your chances of developing some of these common conditions. What are the signs and symptoms of menopause? During menopause, you may have the following symptoms: Hot flashes. These can be moderate or severe. Night sweats. Decrease in sex drive. Mood swings. Headaches. Tiredness (fatigue). Irritability. Memory problems. Problems falling asleep or staying asleep. Talk with your health care provider about treatment options for your symptoms. Do I need hormone replacement therapy? Hormone replacement therapy is effective in treating symptoms that are caused by menopause, such as hot flashes and night sweats. Hormone replacement carries certain risks, especially as you become older. If you are thinking about using estrogen or estrogen with progestin, discuss the benefits and risks with your health care provider. How can I reduce my risk for heart disease and stroke? The risk  of heart disease, heart attack, and stroke increases as you age. One of the causes may be a change in the body's hormones during menopause. This can affect how your body uses dietary fats, triglycerides, and cholesterol. Heart attack and stroke are medical emergencies. There are many things that you can do to help prevent heart disease and stroke. Watch your blood pressure High blood pressure causes heart disease and increases the risk of stroke. This is more likely to develop in people who have high blood pressure readings or are overweight. Have your blood pressure checked: Every 3-5 years if you are 49-31 years of age. Every year if you are 22 years old or older. Eat a healthy diet  Eat a diet that includes plenty of vegetables, fruits, low-fat dairy products, and lean protein. Do not eat a lot of foods that are high in solid fats, added sugars, or sodium. Get regular exercise Get regular exercise. This is one of the most important things you can do for your health. Most adults should: Try to exercise for at least 150 minutes each week. The exercise should increase your heart rate and make you sweat (moderate-intensity exercise). Try to do strengthening exercises at least twice each week. Do these in addition to the moderate-intensity exercise. Spend less time sitting. Even light physical activity can be beneficial. Other tips Work with your health care provider to achieve or maintain a healthy weight. Do not use any products that contain nicotine or tobacco. These products include cigarettes, chewing tobacco, and vaping devices, such as e-cigarettes. If you need help quitting, ask your health care provider. Know your numbers. Ask your health care provider to check  your cholesterol and your blood sugar (glucose). Continue to have your blood tested as directed by your health care provider. Do I need screening for cancer? Depending on your health history and family history, you may need to have  cancer screenings at different stages of your life. This may include screening for: Breast cancer. Cervical cancer. Lung cancer. Colorectal cancer. What is my risk for osteoporosis? After menopause, you may be at increased risk for osteoporosis. Osteoporosis is a condition in which bone destruction happens more quickly than new bone creation. To help prevent osteoporosis or the bone fractures that can happen because of osteoporosis, you may take the following actions: If you are 93-26 years old, get at least 1,000 mg of calcium and at least 600 international units (IU) of vitamin D per day. If you are older than age 64 but younger than age 50, get at least 1,200 mg of calcium and at least 600 international units (IU) of vitamin D per day. If you are older than age 29, get at least 1,200 mg of calcium and at least 800 international units (IU) of vitamin D per day. Smoking and drinking excessive alcohol increase the risk of osteoporosis. Eat foods that are rich in calcium and vitamin D, and do weight-bearing exercises several times each week as directed by your health care provider. How does menopause affect my mental health? Depression may occur at any age, but it is more common as you become older. Common symptoms of depression include: Feeling depressed. Changes in sleep patterns. Changes in appetite or eating patterns. Feeling an overall lack of motivation or enjoyment of activities that you previously enjoyed. Frequent crying spells. Talk with your health care provider if you think that you are experiencing any of these symptoms. General instructions See your health care provider for regular wellness exams and vaccines. This may include: Scheduling regular health, dental, and eye exams. Getting and maintaining your vaccines. These include: Influenza vaccine. Get this vaccine each year before the flu season begins. Pneumonia vaccine. Shingles vaccine. Tetanus, diphtheria, and pertussis  (Tdap) booster vaccine. Your health care provider may also recommend other immunizations. Tell your health care provider if you have ever been abused or do not feel safe at home. Summary Menopause is a normal process in which your ability to get pregnant comes to an end. This condition causes hot flashes, night sweats, decreased interest in sex, mood swings, headaches, or lack of sleep. Treatment for this condition may include hormone replacement therapy. Take actions to keep yourself healthy, including exercising regularly, eating a healthy diet, watching your weight, and checking your blood pressure and blood sugar levels. Get screened for cancer and depression. Make sure that you are up to date with all your vaccines. This information is not intended to replace advice given to you by your health care provider. Make sure you discuss any questions you have with your health care provider. Document Revised: 10/23/2020 Document Reviewed: 10/23/2020 Elsevier Patient Education  [place].

## 2021-07-11 NOTE — Assessment & Plan Note (Signed)
Pending A1c.  She is overdue to see endocrine however insurance is changed and we are currently working on a new endocrinologist. Phone call to referral coordinator today.

## 2021-07-11 NOTE — Assessment & Plan Note (Signed)
Blood pressure elevated today.  She has not taken antihypertensives today.  Patient reports being out of medications although not entirely sure which ones.  I have asked her to perform medication reconciliation at home and to bring all of her medications with her to her next visit.  I have also emphasized importance of continued follow-up with cardiology and as her insurance is changed, as well as placed referral to [facility] cardiology to establish ongoing care, surveillance.  For now I advised her to continue enalapril, nifedipine , digoxin and carvedilol.

## 2021-07-11 NOTE — Progress Notes (Signed)
Subjective:    Patient ID: Chelsea Barton, female    DOB: 1961/01/18, 61 y.o.   MRN: [id number]  CC: Chelsea Barton is a 61 y.o. female who presents today for physical exam.    HPI: Feels well today.  No new complaints  HTN and h/o LVH without heart failure- compliant with enalapril, nifedipine , digoxin and carvedilol. She hasnt taken medications today. She is out of two medication and thinks it is farxiga and enalapril. At home BP 110/60. No cp, sob.   She is overdue for follow up with cardiology and insurance no longer covers Chelsea Chelsea Barton whom she had last seen 09/04/20 for LVH, CAD, moderate mitral insufficiency  DM1- following with endocrine She continues to follow with Chelsea Barton and Chelsea Barton ID, for left midfoot ulcer associated with DM.  CKD- following with Chelsea Chelsea Barton and compliant with dapagliflozin, enalapril   Colorectal Cancer Screening: declines screening with colonoscopy or cologuard.   Breast Cancer Screening: Mammogram UTD Cervical Cancer Screening: due   Lung Cancer Screening: Doesn't have 20 year pack year history and age > 24 years yo 65 years        Tetanus - due        Pneumococcal - Candidate for PCV20  Labs: Screening labs today. Exercise: Gets regular exercise with walking, shopping, taking care of granddaughter.   Alcohol use:  None Smoking/tobacco use: Nonsmoker.     HISTORY:  Past Medical History:  Diagnosis Date   Diabetes mellitus    Hyperlipidemia    Hypertension     Past Surgical History:  Procedure Laterality Date   AMPUTATION TOE  04/10/2021   Procedure: AMPUTATION 2nd TOE, left foot;  Surgeon: Edrick Kins, Barton;  Location: [facility];  Service: Podiatry;;   CATARACT EXTRACTION     eye surgery r Right    lense placed   INCISION AND DRAINAGE Left 04/18/2021   Procedure: INCISION AND DRAINAGE;  Surgeon: [name], Barton;  Location: [facility];  Service: Podiatry;  Laterality: Left;   INCISION AND DRAINAGE OF WOUND Left  04/10/2021   Procedure: IRRIGATION AND DEBRIDEMENT WOUND;  Surgeon: Edrick Kins, Barton;  Location: [facility];  Service: Podiatry;  Laterality: Left;   PERIPHERAL VASCULAR BALLOON ANGIOPLASTY Left 04/13/2021   Procedure: PERIPHERAL VASCULAR BALLOON ANGIOPLASTY;  Surgeon: Chelsea Cabal, MD;  Location: [facility];  Service: Cardiovascular;  Laterality: Left;   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   Procedure: TRANSMETATARSAL AMPUTATION;  Surgeon: Chelsea Barton, Barton;  Location: [facility];  Service: Podiatry;  Laterality: Left;   Family History  Problem Relation Age of Onset   Diabetes Sister        3/5 sisters have DM   Breast cancer Neg Hx    Colon cancer Neg Hx       ALLERGIES: Patient has no known allergies.  Current Outpatient Medications on File Prior to Visit  Medication Sig Dispense Refill   aspirin 81 MG EC tablet Take 1 tablet (81 mg total) by mouth daily. Swallow whole. 30 tablet 12   dapagliflozin propanediol (FARXIGA) 10 MG TABS tablet Take 10 mg by mouth daily.     digoxin (LANOXIN) 0.125 MG tablet Take 1 tablet (125 mcg total) by mouth daily. 90 tablet 1   Insulin Pen Needle (B-D ULTRAFINE III SHORT PEN) 31G X 8 MM MISC USE AS DIRECTED 100 each 6   Multiple Vitamin (MULTIVITAMIN) capsule Take 1 capsule by mouth daily.  mupirocin ointment (BACTROBAN) 2 % Apply 1 application topically 2 (two) times daily. 30 g 2   Vitamin D, Cholecalciferol, 10 MCG (400 UNIT) CAPS Take 1 capsule by mouth daily.     No current facility-administered medications on file prior to visit.    Social History   Tobacco Use   Smoking status: Former    Types: Cigarettes    Quit date: 06/18/2005    Years since quitting: 16.0   Smokeless tobacco: Never  Vaping Use   Vaping Use: Never used  Substance Use Topics   Alcohol use: No   Drug use: No    Review of Systems  Constitutional:  Negative for chills, fever and unexpected weight change.  HENT:  Negative for congestion.    Respiratory:  Negative for cough.   Cardiovascular:  Negative for chest pain, palpitations and leg swelling.  Gastrointestinal:  Negative for nausea and vomiting.  Musculoskeletal:  Negative for arthralgias and myalgias.  Skin:  Positive for wound (left foot). Negative for rash.  Neurological:  Negative for headaches.  Hematological:  Negative for adenopathy.  Psychiatric/Behavioral:  Negative for confusion.      Objective:    BP (!) 142/74 (BP Location: Left Arm, Patient Position: Sitting, Cuff Size: Large)    Pulse 64    Temp 97.9 F (36.6 C) (Oral)    Ht 5\' 5"  (1.651 m)    Wt 188 lb 12.8 oz (85.6 kg)    SpO2 97%    BMI 31.42 kg/m   BP Readings from Last 3 Encounters:  07/11/21 (!) 142/74  07/03/21 116/68  06/26/21 (!) 159/86   Wt Readings from Last 3 Encounters:  07/11/21 188 lb 12.8 oz (85.6 kg)  07/03/21 186 lb (84.4 kg)  06/26/21 190 lb (86.2 kg)    Physical Exam Vitals reviewed.  Constitutional:      Appearance: Normal appearance. She is well-developed.  Eyes:     Conjunctiva/sclera: Conjunctivae normal.  Neck:     Thyroid: No thyroid mass or thyromegaly.  Cardiovascular:     Rate and Rhythm: Normal rate and regular rhythm.     Pulses: Normal pulses.     Heart sounds: Normal heart sounds.  Pulmonary:     Effort: Pulmonary effort is normal.     Breath sounds: Normal breath sounds. No wheezing, rhonchi or rales.  Chest:  Breasts:    Breasts are symmetrical.     Right: No inverted nipple, mass, nipple discharge, skin change or tenderness.     Left: No inverted nipple, mass, nipple discharge, skin change or tenderness.  Abdominal:     General: Bowel sounds are normal. There is no distension.     Palpations: Abdomen is soft. Abdomen is not rigid. There is no fluid wave or mass.     Tenderness: There is no abdominal tenderness. There is no guarding or rebound.  Genitourinary:    Cervix: No cervical motion tenderness, discharge or friability.     Uterus: Not  enlarged, not fixed and not tender.      Adnexa:        Right: No mass, tenderness or fullness.         Left: No mass, tenderness or fullness.       Comments: Pap performed. No CMT. Unable to appreciated ovaries. Lymphadenopathy:     Head:     Right side of head: No submental, submandibular, tonsillar, preauricular, posterior auricular or occipital adenopathy.     Left side of head: No submental, submandibular,  tonsillar, preauricular, posterior auricular or occipital adenopathy.     Cervical:     Right cervical: No superficial, deep or posterior cervical adenopathy.    Left cervical: No superficial, deep or posterior cervical adenopathy.     Upper Body:     Right upper body: No pectoral adenopathy.     Left upper body: No pectoral adenopathy.  Skin:    General: Skin is warm and dry.  Neurological:     Mental Status: She is alert.  Psychiatric:        Speech: Speech normal.        Behavior: Behavior normal.        Thought Content: Thought content normal.       Assessment & Plan:   Problem List Items Addressed This Visit       Cardiovascular and Mediastinum   Benign essential hypertension   Relevant Orders   Digoxin level   LVH (left ventricular hypertrophy) due to hypertensive disease, without heart failure    Blood pressure elevated today.  She has not taken antihypertensives today.  Patient reports being out of medications although not entirely sure which ones.  I have asked her to perform medication reconciliation at home and to bring all of her medications with her to her next visit.  I have also emphasized importance of continued follow-up with cardiology and as her insurance is changed, as well as placed referral to [facility] cardiology to establish ongoing care, surveillance.  For now I advised her to continue enalapril, nifedipine , digoxin and carvedilol.         Endocrine   DM type 1 with diabetic peripheral neuropathy (HCC)    Pending A1c.  She is overdue to see  endocrine however insurance is changed and we are currently working on a new endocrinologist. Phone call to referral coordinator today.        Other   Routine general medical examination at a health care facility - Primary    Clinical breast exam and mammogram performed today.  Patient declines screening for colon cancer either with colonoscopy or Cologuard.  Encouraged continued exercise      Relevant Orders   Cytology - PAP( [facility])   Hemoglobin A1c   VITAMIN D 25 Hydroxy (Vit-D Deficiency, Fractures)   TSH   Microalbumin / creatinine urine ratio   Other Visit Diagnoses     Screen for colon cancer       Need for Tdap vaccination       Relevant Orders   Tdap vaccine greater than or equal to 7yo IM (Completed)   Need for pneumococcal vaccination       Relevant Orders   Pneumococcal conjugate vaccine 20-valent (Prevnar 20) (Completed)        I have discontinued Yiselle D. Shearer's amoxicillin-clavulanate. I am also having her maintain her multivitamin, B-D ULTRAFINE III SHORT PEN, digoxin, aspirin, dapagliflozin propanediol, Vitamin D (Cholecalciferol), and mupirocin ointment.   No orders of the defined types were placed in this encounter.   Return precautions given.   Risks, benefits, and alternatives of the medications and treatment plan prescribed today were discussed, and patient expressed understanding.   Education regarding symptom management and diagnosis given to patient on AVS.   Continue to follow with Burnard Hawthorne, FNP for routine health maintenance.   Chevonne D Piasecki and I agreed with plan.   Mable Paris, FNP

## 2021-07-12 LAB — CYTOLOGY - PAP
Comment: NEGATIVE
Diagnosis: NEGATIVE
High risk HPV: NEGATIVE

## 2021-07-12 LAB — DIGOXIN LEVEL: Digoxin Level: 1.2 mcg/L (ref 0.8–2.0)

## 2021-07-15 ENCOUNTER — Encounter (INDEPENDENT_AMBULATORY_CARE_PROVIDER_SITE_OTHER): Payer: Self-pay | Admitting: Nurse Practitioner

## 2021-07-15 NOTE — Progress Notes (Signed)
Subjective:    Patient ID: Chelsea Barton, female    DOB: Oct 11, 1960, 61 y.o.   MRN: [id number] Chief Complaint  Patient presents with   Follow-up    ultrasound    Chelsea Barton is a 61 year old female that presents today at the referral of Dr. Sherryle Lis her podiatrist, for blood blisters that have formed on her right lower extremity concerned that they may turn into ulcerations.  The patient has recently had a left transmetatarsal amputation that is healing well following intervention.  The patient notes that the areas that her blisters have been present for extended period of time.  She denies any pain or issues with walking.  She denies any rest pain.  Today noninvasive studies show a right ABI 0.97 and the left is 0.99.  She has a TBI 0.62 on the right.  This is slightly decreased from previous studies.  Noninvasive studies show that she has monophasic/biphasic waveforms in the tibial arteries with normal toe waveforms in her right lower extremity.   Review of Systems  Cardiovascular:  Positive for leg swelling.  Skin:  Positive for wound.  All other systems reviewed and are negative.     Objective:   Physical Exam Vitals reviewed.  HENT:     Head: Normocephalic.  Cardiovascular:     Rate and Rhythm: Normal rate.     Pulses:          Dorsalis pedis pulses are detected w/ Doppler on the right side.       Posterior tibial pulses are detected w/ Doppler on the right side.  Pulmonary:     Effort: Pulmonary effort is normal.  Skin:    General: Skin is warm and dry.  Neurological:     Mental Status: She is alert and oriented to person, place, and time.  Psychiatric:        Mood and Affect: Mood normal.        Behavior: Behavior normal.        Thought Content: Thought content normal.        Judgment: Judgment normal.    BP 116/68 (BP Location: Left Arm)    Pulse 67    Resp 16    Ht 5\' 5"  (1.651 m)    Wt 186 lb (84.4 kg)    BMI 30.95 kg/m   Past Medical History:  Diagnosis  Date   Diabetes mellitus    Hyperlipidemia    Hypertension     Social History   Socioeconomic History   Marital status: Single    Spouse name: Not on file   Number of children: Not on file   Years of education: Not on file   Highest education level: Not on file  Occupational History   Not on file  Tobacco Use   Smoking status: Former    Types: Cigarettes    Quit date: 06/18/2005    Years since quitting: 16.0   Smokeless tobacco: Never  Vaping Use   Vaping Use: Never used  Substance and Sexual Activity   Alcohol use: No   Drug use: No   Sexual activity: Not on file  Other Topics Concern   Not on file  Social History Narrative   Has a great niece that she keeps a lot, 82 yo, '[name]'.  Stays weekends with her.       Has 8 great nieces, 3 nieces, 6 nephews   Social Determinants of Health   Financial Resource Strain: Medium Risk   Difficulty  of Paying Living Expenses: Somewhat hard  Food Insecurity: Food Insecurity Present   Worried About [place] in the Last Year: Sometimes true   Ran Out of Food in the Last Year: Sometimes true  Transportation Needs: No Transportation Needs   Lack of Transportation (Medical): No   Lack of Transportation (Non-Medical): No  Physical Activity: Sufficiently Active   Days of Exercise per Week: 7 days   Minutes of Exercise per Session: 30 min  Stress: Not on file  Social Connections: Not on file  Intimate Partner Violence: Not on file    Past Surgical History:  Procedure Laterality Date   AMPUTATION TOE  04/10/2021   Procedure: AMPUTATION 2nd TOE, left foot;  Surgeon: Edrick Kins, DPM;  Location: [facility];  Service: Podiatry;;   CATARACT EXTRACTION     eye surgery r Right    lense placed   INCISION AND DRAINAGE Left 04/18/2021   Procedure: INCISION AND DRAINAGE;  Surgeon: [name], DPM;  Location: [facility];  Service: Podiatry;  Laterality: Left;   INCISION AND DRAINAGE OF WOUND Left 04/10/2021   Procedure:  IRRIGATION AND DEBRIDEMENT WOUND;  Surgeon: Edrick Kins, DPM;  Location: [facility];  Service: Podiatry;  Laterality: Left;   PERIPHERAL VASCULAR BALLOON ANGIOPLASTY Left 04/13/2021   Procedure: PERIPHERAL VASCULAR BALLOON ANGIOPLASTY;  Surgeon: Katha Cabal, MD;  Location: [facility];  Service: Cardiovascular;  Laterality: Left;   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   Procedure: TRANSMETATARSAL AMPUTATION;  Surgeon: Felipa Furnace, DPM;  Location: [facility];  Service: Podiatry;  Laterality: Left;    Family History  Problem Relation Age of Onset   Diabetes Sister        11/5 sisters have DM   Breast cancer Neg Hx    Colon cancer Neg Hx     No Known Allergies  CBC Latest Ref Rng & Units 06/14/2021 05/08/2021 04/24/2021  WBC 3.4 - 10.8 x10E3/uL 9.2 7.7 8.5  Hemoglobin 11.1 - 15.9 g/dL 12.1 11.6(L) 9.7(L)  Hematocrit 34.0 - 46.6 % 39.0 37.7 32.5(L)  Platelets 150 - 450 x10E3/uL 250 265.0 341      CMP     Component Value Date/Time   NA 140 05/24/2021 1052   NA 137 01/20/2014 0000   K 4.7 05/24/2021 1052   CL 103 05/24/2021 1052   CO2 30 05/24/2021 1052   GLUCOSE 153 (H) 05/24/2021 1052   BUN 31 (H) 05/24/2021 1052   BUN 25 (A) 01/20/2014 0000   CREATININE 2.17 (H) 05/24/2021 1052   CALCIUM 9.3 05/24/2021 1052   PROT 7.2 05/08/2021 1021   ALBUMIN 3.8 05/08/2021 1021   AST 16 05/08/2021 1021   ALT 14 05/08/2021 1021   ALKPHOS 84 05/08/2021 1021   BILITOT 0.4 05/08/2021 1021   GFRNONAA 32 (L) 04/24/2021 0654   GFRAA 53 (L) 11/14/2017 1349     VAS [name] ABI WITH/WO TBI  Result Date: 07/05/2021  LOWER EXTREMITY DOPPLER STUDY Patient Name:  Chelsea Barton  Date of Exam:   07/03/2021 Medical Rec #: [id number]        Accession #:    [id number] Date of Birth: September 07, 1960         Patient Gender: F Patient Age:   34 years Exam Location:  [facility] Procedure:      VAS [name] ABI WITH/WO TBI Referring Phys: Eulogio Ditch  --------------------------------------------------------------------------------   Vascular Interventions: 04/13/21: Left SFA atherectomy/PTA/ stent & left  transmetatarsal amputation;. Performing Technologist: Blondell Reveal RT, RDMS, RVT  Examination Guidelines: A complete evaluation includes at minimum, Doppler waveform signals and systolic blood pressure reading at the level of bilateral brachial, anterior tibial, and posterior tibial arteries, when vessel segments are accessible. Bilateral testing is considered an integral part of a complete examination. Photoelectric Plethysmograph (PPG) waveforms and toe systolic pressure readings are included as required and additional duplex testing as needed. Limited examinations for reoccurring indications may be performed as noted.  ABI Findings: +--------+------------------+-----+----------+--------+  Right    Rt Pressure (mmHg) Index Waveform   Comment   +--------+------------------+-----+----------+--------+  Brachial 189                                           +--------+------------------+-----+----------+--------+  ATA      161                0.82  monophasic           +--------+------------------+-----+----------+--------+  PTA      190                0.97  biphasic             +--------+------------------+-----+----------+--------+  PERO     155                0.79  monophasic           +--------+------------------+-----+----------+--------+ +--------+------------------+-----+---------+-------+  Left     Lt Pressure (mmHg) Index Waveform  Comment  +--------+------------------+-----+---------+-------+  Brachial 196                                         +--------+------------------+-----+---------+-------+  ATA      180                0.92  hyperemic          +--------+------------------+-----+---------+-------+  PTA      194                0.99  hyperemic          +--------+------------------+-----+---------+-------+  +-------+-----------+-----------+------------+------------+  ABI/TBI Today's ABI Today's TBI Previous ABI Previous TBI  +-------+-----------+-----------+------------+------------+  Right   0.97        0.62        1.17         0.75          +-------+-----------+-----------+------------+------------+  Left    0.99        NA          1.19         NA            +-------+-----------+-----------+------------+------------+ TOES Findings: +----------+---------------+--------+-------+  Right Toes Pressure (mmHg) Waveform Comment  +----------+---------------+--------+-------+  1st Digit  122             Normal            +----------+---------------+--------+-------+  2nd Digit                  Normal            +----------+---------------+--------+-------+  3rd Digit                  Normal            +----------+---------------+--------+-------+  4th Digit                  Normal            +----------+---------------+--------+-------+  5th Digit                  Normal            +----------+---------------+--------+-------+  Bilateral ABIs appear mildly decreased compared to prior study on 05/23/21, however, they are still in normal limits. Right TBI appears mildly decreased.  Summary: Right: Resting right ankle-brachial index is within normal range, however, there is evidence of mild/moderate tibial level arterial disease. The right toe-brachial index is mildly abnormal. Left: Resting left ankle-brachial index is within normal range, however, there is evidence of possible mild ATA level disease.  *See table(s) above for measurements and observations.  Electronically signed by [name] MD on 07/05/2021 at 4:25:39 PM.    Final    VAS [name] ABI WITH/WO TBI  Result Date: 05/28/2021  LOWER EXTREMITY DOPPLER STUDY Patient Name:  Chelsea Barton  Date of Exam:   05/23/2021 Medical Rec #: [id number]        Accession #:    [id number] Date of Birth: May 06, 1961         Patient Gender: F Patient Age:   59 years Exam Location:   [facility] Procedure:      VAS [name] ABI WITH/WO TBI Referring Phys: --------------------------------------------------------------------------------   Vascular Interventions: 03/2021 Left PTA and stents                         Left forefoot amputation. Performing Technologist: Concha Norway RVT  Examination Guidelines: A complete evaluation includes at minimum, Doppler waveform signals and systolic blood pressure reading at the level of bilateral brachial, anterior tibial, and posterior tibial arteries, when vessel segments are accessible. Bilateral testing is considered an integral part of a complete examination. Photoelectric Plethysmograph (PPG) waveforms and toe systolic pressure readings are included as required and additional duplex testing as needed. Limited examinations for reoccurring indications may be performed as noted.  ABI Findings: +---------+------------------+-----+--------+--------+  Right     Rt Pressure (mmHg) Index Waveform Comment   +---------+------------------+-----+--------+--------+  Brachial  155                                         +---------+------------------+-----+--------+--------+  ATA       161                      biphasic           +---------+------------------+-----+--------+--------+  PTA       181                1.17  biphasic           +---------+------------------+-----+--------+--------+  Great Toe 117                0.75  Normal             +---------+------------------+-----+--------+--------+ +---------+------------------+-----+----------+-------+  Left      Lt Pressure (mmHg) Index Waveform   Comment  +---------+------------------+-----+----------+-------+  Brachial  155                                          +---------+------------------+-----+----------+-------+  ATA       175                      monophasic          +---------+------------------+-----+----------+-------+  PTA       184                1.19  biphasic             +---------+------------------+-----+----------+-------+  Great Toe                                     absent   +---------+------------------+-----+----------+-------+  Summary: Right: Resting right ankle-brachial index is within normal range. No evidence of significant right lower extremity arterial disease. The right toe-brachial index is normal. Left: Resting left ankle-brachial index is within normal range. No evidence of significant left lower extremity arterial disease. Left forefoot amputated.  *See table(s) above for measurements and observations.  Electronically signed by [name] MD on 05/28/2021 at 4:52:07 PM.    Final        Assessment & Plan:   1. PAD (peripheral artery disease) ([place]) The patient is currently following with podiatry for her left transmetatarsal amputation and they will be closely monitoring her right lower extremity.  Based on this and the ABIs today, we will maintain close follow-up versus intervention.  The patient is also hesitant to proceed with any sort of intervention at this time.  The patient's TBI shows she should have adequate perfusion for wound healing as she also has good strong waveforms in each of her toes.  We will have her return in 3 months with an ABI in lower extremity arterial duplex of the right lower extremity.  2. Primary hypertension Continue antihypertensive medications as already ordered, these medications have been reviewed and there are no changes at this time.   3. Hyperlipidemia, unspecified hyperlipidemia type Continue statin as ordered and reviewed, no changes at this time    Current Outpatient Medications on File Prior to Visit  Medication Sig Dispense Refill   aspirin 81 MG EC tablet Take 1 tablet (81 mg total) by mouth daily. Swallow whole. 30 tablet 12   dapagliflozin propanediol (FARXIGA) 10 MG TABS tablet Take 10 mg by mouth daily.     digoxin (LANOXIN) 0.125 MG tablet Take 1 tablet (125 mcg total) by mouth daily. 90  tablet 1   Insulin Pen Needle (B-D ULTRAFINE III SHORT PEN) 31G X 8 MM MISC USE AS DIRECTED 100 each 6   Multiple Vitamin (MULTIVITAMIN) capsule Take 1 capsule by mouth daily.     Vitamin D, Cholecalciferol, 10 MCG (400 UNIT) CAPS Take 1 capsule by mouth daily.     mupirocin ointment (BACTROBAN) 2 % Apply 1 application topically 2 (two) times daily. 30 g 2   No current facility-administered medications on file prior to visit.    There are no Patient Instructions on file for this visit. No follow-ups on file.   Kris Hartmann, NP

## 2021-07-16 ENCOUNTER — Other Ambulatory Visit: Payer: Self-pay

## 2021-07-16 ENCOUNTER — Encounter: Payer: Self-pay | Admitting: Cardiology

## 2021-07-16 ENCOUNTER — Ambulatory Visit (INDEPENDENT_AMBULATORY_CARE_PROVIDER_SITE_OTHER): Payer: Medicaid Other | Admitting: Cardiology

## 2021-07-16 VITALS — BP 190/90 | Ht 65.0 in | Wt 189.0 lb

## 2021-07-16 DIAGNOSIS — I340 Nonrheumatic mitral (valve) insufficiency: Secondary | ICD-10-CM | POA: Diagnosis not present

## 2021-07-16 DIAGNOSIS — E7800 Pure hypercholesterolemia, unspecified: Secondary | ICD-10-CM | POA: Diagnosis not present

## 2021-07-16 DIAGNOSIS — I10 Essential (primary) hypertension: Secondary | ICD-10-CM | POA: Diagnosis not present

## 2021-07-16 NOTE — Patient Instructions (Signed)
Medication Instructions:   Your physician has recommended you make the following change in your medication:    STOP taking your Digoxin.   *If you need a refill on your cardiac medications before your next appointment, please call your pharmacy*   Lab Work: None ordered If you have labs (blood work) drawn today and your tests are completely normal, you will receive your results only by: [place] (if you have MyChart) OR A paper copy in the mail If you have any lab test that is abnormal or we need to change your treatment, we will call you to review the results.   Testing/Procedures:   A 24 hour Blood Pressure monitor has been ordered for you. You will receive a phone call to get this scheduled from our [facility] in [place].  2.   Your physician has requested that you have an echocardiogram. Echocardiography is a painless test that uses sound waves to create images of your heart. It provides your doctor with information about the size and shape of your heart and how well your hearts chambers and valves are working. This procedure takes approximately one hour. There are no restrictions for this procedure.    Follow-Up: At [facility], you and your health needs are our priority.  As part of our continuing mission to provide you with exceptional heart care, we have created designated Provider Care Teams.  These Care Teams include your primary Cardiologist (physician) and Advanced Practice Providers (APPs -  Physician Assistants and Nurse Practitioners) who all work together to provide you with the care you need, when you need it.  We recommend signing up for the patient portal called "MyChart".  Sign up information is provided on this After Visit Summary.  MyChart is used to connect with patients for Virtual Visits (Telemedicine).  Patients are able to view lab/test results, encounter notes, upcoming appointments, etc.  Non-urgent messages can be sent to your provider  as well.   To learn more about what you can do with MyChart, go to [email or web address].    Your next appointment:   8 week(s)  The format for your next appointment:   In Person  Provider:   Kate Sable, MD    Other Instructions

## 2021-07-16 NOTE — Progress Notes (Signed)
Cardiology Office Note:    Date:  07/16/2021   ID:  [name], DOB 09-12-1960, MRN [id number]  PCP:  Burnard Hawthorne, [place] Providers Cardiologist:  Kate Sable, MD     Referring MD: Burnard Hawthorne, FNP   Chief Complaint  Patient presents with   NEW patient-Establish cardiac care for CAD/Mitral valve     disease    History of Present Illness:    Chelsea Barton is a 61 y.o. female with a hx of hypertension, hyperlipidemia, diabetes, PAD s/p left transmetatarsal amputation, CKD, former smoker x30 years who presents due to history of mitral valve disease.  Patient previously seen at [facility] clinic cardiology from a cardiac perspective.  Her insurance is no longer accepted at prior cardiac practice.  She had an echocardiogram in 2014 showing EF 45%, moderate mitral valve regurgitation.  She denies chest pain or shortness of breath.  Takes digoxin for years now, unsure why.  Blood pressures at home are usually 119 and 147 systolic.  Has not taken BP meds this morning.  States taking hydralazine as needed only when BP is elevated.  Her blood pressures are always well controlled, check BP 3 to 4 days a week.  Past Medical History:  Diagnosis Date   Diabetes mellitus    Hyperlipidemia    Hypertension     Past Surgical History:  Procedure Laterality Date   AMPUTATION TOE  04/10/2021   Procedure: AMPUTATION 2nd TOE, left foot;  Surgeon: Edrick Kins, DPM;  Location: [facility];  Service: Podiatry;;   CATARACT EXTRACTION     eye surgery r Right    lense placed   INCISION AND DRAINAGE Left 04/18/2021   Procedure: INCISION AND DRAINAGE;  Surgeon: [name], DPM;  Location: [facility];  Service: Podiatry;  Laterality: Left;   INCISION AND DRAINAGE OF WOUND Left 04/10/2021   Procedure: IRRIGATION AND DEBRIDEMENT WOUND;  Surgeon: Edrick Kins, DPM;  Location: [facility];  Service: Podiatry;  Laterality: Left;   PERIPHERAL VASCULAR BALLOON ANGIOPLASTY  Left 04/13/2021   Procedure: PERIPHERAL VASCULAR BALLOON ANGIOPLASTY;  Surgeon: Katha Cabal, MD;  Location: [place] [facility];  Service: Cardiovascular;  Laterality: Left;   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   Procedure: TRANSMETATARSAL AMPUTATION;  Surgeon: Felipa Furnace, DPM;  Location: [facility];  Service: Podiatry;  Laterality: Left;    Current Medications: Current Meds  Medication Sig   aspirin 81 MG EC tablet Take 1 tablet (81 mg total) by mouth daily. Swallow whole.   carvedilol (COREG) 12.5 MG tablet Take 1 tablet (12.5 mg total) by mouth 2 (two) times daily with a meal. Take one tablet by mouth daily with a meal.   Continuous Blood Gluc Sensor (DEXCOM G6 SENSOR) MISC Place a new sensor every 10 days. Use to check blood sugar at least 4 times daily   Continuous Blood Gluc Transmit (DEXCOM G6 TRANSMITTER) MISC Use to check glucose at least 4 times daily   dapagliflozin propanediol (FARXIGA) 10 MG TABS tablet Take 10 mg by mouth daily.   enalapril (VASOTEC) 20 MG tablet Take 2 tablets (40 mg total) by mouth daily.   hydrALAZINE (APRESOLINE) 10 MG tablet TAKE 1/2 TABLET BY MOUTH THREE TIMES DAILY AS NEEDED(TAKE IF BLOOD PRESSURE IS GREATER THAN 140/90)   insulin glargine (LANTUS) 100 UNIT/ML Solostar Pen Inject 40 Units into the skin daily.   Insulin Pen Needle (B-D ULTRAFINE III SHORT PEN) 31G X 8 MM MISC  USE AS DIRECTED   Multiple Vitamin (MULTIVITAMIN) capsule Take 1 capsule by mouth daily.   mupirocin ointment (BACTROBAN) 2 % Apply 1 application topically 2 (two) times daily.   NIFEdipine (PROCARDIA-XL/NIFEDICAL-XL) 30 MG 24 hr tablet Take 1 tablet (30 mg total) by mouth daily.   NOVOLOG FLEXPEN 100 UNIT/ML FlexPen Inject 25 Units into the skin 3 (three) times daily with meals. Take 5 units at breakfast, 10 units at lunch and 10 units at dinner.   rosuvastatin (CRESTOR) 10 MG tablet Take 1 tablet (10 mg total) by mouth daily.   Vitamin D, Cholecalciferol, 10 MCG  (400 UNIT) CAPS Take 1 capsule by mouth daily.   [DISCONTINUED] digoxin (LANOXIN) 0.125 MG tablet Take 1 tablet (125 mcg total) by mouth daily.     Allergies:   Patient has no known allergies.   Social History   Socioeconomic History   Marital status: Single    Spouse name: Not on file   Number of children: Not on file   Years of education: Not on file   Highest education level: Not on file  Occupational History   Not on file  Tobacco Use   Smoking status: Former    Types: Cigarettes    Quit date: 06/18/2005    Years since quitting: 16.0   Smokeless tobacco: Never  Vaping Use   Vaping Use: Never used  Substance and Sexual Activity   Alcohol use: No   Drug use: No   Sexual activity: Not on file  Other Topics Concern   Not on file  Social History Narrative   Has a great niece that she keeps a lot, 26 yo, '[name]'.  Stays weekends with her.       Has 8 great nieces, 3 nieces, 6 nephews   Social Determinants of Radio broadcast assistant Strain: Medium Risk   Difficulty of Paying Living Expenses: Somewhat hard  Food Insecurity: Landscape architect Present   Worried About Charity fundraiser in the Last Year: Sometimes true   Arboriculturist in the Last Year: Sometimes true  Transportation Needs: No Transportation Needs   Lack of Transportation (Medical): No   Lack of Transportation (Non-Medical): No  Physical Activity: Sufficiently Active   Days of Exercise per Week: 7 days   Minutes of Exercise per Session: 30 min  Stress: Not on file  Social Connections: Not on file     Family History: The patient's family history includes Diabetes in her sister. There is no history of Breast cancer or Colon cancer.  ROS:   Please see the history of present illness.     All other systems reviewed and are negative.  EKGs/Labs/Other Studies Reviewed:    The following studies were reviewed today:   EKG:  EKG is  ordered today.  The ekg ordered today demonstrates normal sinus  rhythm, occasional PVCs  Recent Labs: 05/08/2021: ALT 14 05/24/2021: BUN 31; Creatinine, Ser 2.17; Potassium 4.7; Sodium 140 06/14/2021: Hemoglobin 12.1; Platelets 250 07/11/2021: TSH 1.46  Recent Lipid Panel    Component Value Date/Time   CHOL 84 04/12/2021 0429   TRIG 87 04/12/2021 0429   HDL 25 (L) 04/12/2021 0429   CHOLHDL 3.4 04/12/2021 0429   VLDL 17 04/12/2021 0429   LDLCALC 42 04/12/2021 0429   LDLDIRECT 84.9 09/10/2012 1628     Risk Assessment/Calculations:          Physical Exam:    VS:  BP (!) 190/90 (BP Location: Right Arm, Patient  Position: Sitting, Cuff Size: Normal)    Ht 5\' 5"  (1.651 m)    Wt 189 lb (85.7 kg)    SpO2 98%    BMI 31.45 kg/m     Wt Readings from Last 3 Encounters:  07/16/21 189 lb (85.7 kg)  07/11/21 188 lb 12.8 oz (85.6 kg)  07/03/21 186 lb (84.4 kg)     GEN:  Well nourished, well developed in no acute distress HEENT: Normal NECK: No JVD; No carotid bruits LYMPHATICS: No lymphadenopathy CARDIAC: RRR, no murmurs, rubs, gallops RESPIRATORY:  Clear to auscultation without rales, wheezing or rhonchi  ABDOMEN: Soft, non-tender, non-distended MUSCULOSKELETAL:  No edema; left metatarsal amputation noted SKIN: Warm and dry NEUROLOGIC:  Alert and oriented x 3 PSYCHIATRIC:  Normal affect   ASSESSMENT:    1. Mitral valve insufficiency, unspecified etiology   2. Primary hypertension   3. Pure hypercholesterolemia    PLAN:    In order of problems listed above:  Mitral regurgitation, echocardiogram showed moderate severity in 2014.  Repeat echocardiogram to evaluate valvular disorders and EF. Hypertension, BP elevated.  BP usually controlled at home.  Has not taken BP meds this AM.  Medication compliance advised, order 24-hour ambulatory BP monitoring.  Continue nifedipine to 30 mg daily, continue Coreg 12.5 mg twice daily, Vasotec 20 mg twice daily.  Patient may have a component of whitecoat syndrome.  Consider increasing nifedipine if BP is  elevated. Hyperlipidemia, cholesterol controlled, continue Crestor 20 mg daily.  Follow-up after echocardiogram in 6 to 8 weeks.       Medication Adjustments/Labs and Tests Ordered: Current medicines are reviewed at length with the patient today.  Concerns regarding medicines are outlined above.  Orders Placed This Encounter  Procedures   24 hour blood pressure monitor   EKG 12-Lead   ECHOCARDIOGRAM COMPLETE   No orders of the defined types were placed in this encounter.   Patient Instructions  Medication Instructions:   Your physician has recommended you make the following change in your medication:    STOP taking your Digoxin.   *If you need a refill on your cardiac medications before your next appointment, please call your pharmacy*   Lab Work: None ordered If you have labs (blood work) drawn today and your tests are completely normal, you will receive your results only by: [place] (if you have MyChart) OR A paper copy in the mail If you have any lab test that is abnormal or we need to change your treatment, we will call you to review the results.   Testing/Procedures:   A 24 hour Blood Pressure monitor has been ordered for you. You will receive a phone call to get this scheduled from our [facility] in [place].  2.   Your physician has requested that you have an echocardiogram. Echocardiography is a painless test that uses sound waves to create images of your heart. It provides your doctor with information about the size and shape of your heart and how well your hearts chambers and valves are working. This procedure takes approximately one hour. There are no restrictions for this procedure.    Follow-Up: At [facility], you and your health needs are our priority.  As part of our continuing mission to provide you with exceptional heart care, we have created designated Provider Care Teams.  These Care Teams include your primary Cardiologist  (physician) and Advanced Practice Providers (APPs -  Physician Assistants and Nurse Practitioners) who all work together to provide you  with the care you need, when you need it.  We recommend signing up for the patient portal called "MyChart".  Sign up information is provided on this After Visit Summary.  MyChart is used to connect with patients for Virtual Visits (Telemedicine).  Patients are able to view lab/test results, encounter notes, upcoming appointments, etc.  Non-urgent messages can be sent to your provider as well.   To learn more about what you can do with MyChart, go to [email or web address].    Your next appointment:   8 week(s)  The format for your next appointment:   In Person  Provider:   Kate Sable, MD    Other Instructions     Signed, Kate Sable, MD  07/16/2021 9:59 AM    [place]

## 2021-07-17 NOTE — Progress Notes (Signed)
Name: Chelsea Barton  MRN/ DOB: [id number], 04-21-1961   Age/ Sex: 61 y.o., female    PCP: Burnard Hawthorne, FNP   Reason for Endocrinology Evaluation: Type 2 Diabetes Mellitus     Date of Initial Endocrinology Visit: 07/20/2021     PATIENT IDENTIFIER: Chelsea Barton is a 61 y.o. female with a past medical history of T2DM , CAD and Dyslipidemia. The patient presented for initial endocrinology clinic visit on 07/20/2021 for consultative assistance with her diabetes management.    HPI: Chelsea Barton was    Diagnosed with DM in many years ago  Prior Medications tried/Intolerance: Metformin Currently checking blood sugars multiple x / day, through CGM.  Hypoglycemia episodes : yes               Symptoms: yes                 Frequency: yes  Hemoglobin A1c has ranged from 7.3% in 2020, peaking at 9.5% in 2014. Patient required assistance for hypoglycemia: yes  Patient has required hospitalization within the last 1 year from hyper or hypoglycemia: no  In terms of diet, the patient eats 3 meals a day, avoids sugar-sweetened beverages   Follows with Nephrology   HOME DIABETES REGIMEN: Farxiga 10 mg daily - through nephrology  Lantus 40 units daily  Novolog 5 /10/10     Statin: yes ACE-I/ARB: no    CONTINUOUS GLUCOSE MONITORING RECORD INTERPRETATION    Dates of Recording: 1/21-07/20/2021  Sensor description:dexcom  Results statistics:   CGM use % of time 93  Average and SD 165/59  Time in range     60   %  % Time Above 180 30  % Time above 250 8  % Time Below target 1     Glycemic patterns summary: OPtimal BG's overnight with occasional tight BG's . Pt noted with hyperglycemia during the day   Hyperglycemic episodes  post prandial   Hypoglycemic episodes occurred during the day and night   Overnight periods: trend down but variable      DIABETIC COMPLICATIONS: Microvascular complications:  CKD IV, retinopathy, S/P left metatarsal amputation  Denies:  neuropathy  Last eye exam: Completed 2022  Macrovascular complications:  CAD Denies:  PVD, CVA   PAST HISTORY: Past Medical History:  Past Medical History:  Diagnosis Date   Diabetes mellitus    Hyperlipidemia    Hypertension    Past Surgical History:  Past Surgical History:  Procedure Laterality Date   AMPUTATION TOE  04/10/2021   Procedure: AMPUTATION 2nd TOE, left foot;  Surgeon: Edrick Kins, DPM;  Location: [facility];  Service: Podiatry;;   CATARACT EXTRACTION     eye surgery r Right    lense placed   INCISION AND DRAINAGE Left 04/18/2021   Procedure: INCISION AND DRAINAGE;  Surgeon: [name], DPM;  Location: [facility];  Service: Podiatry;  Laterality: Left;   INCISION AND DRAINAGE OF WOUND Left 04/10/2021   Procedure: IRRIGATION AND DEBRIDEMENT WOUND;  Surgeon: Edrick Kins, DPM;  Location: [facility];  Service: Podiatry;  Laterality: Left;   PERIPHERAL VASCULAR BALLOON ANGIOPLASTY Left 04/13/2021   Procedure: PERIPHERAL VASCULAR BALLOON ANGIOPLASTY;  Surgeon: Katha Cabal, MD;  Location: [place] [facility];  Service: Cardiovascular;  Laterality: Left;   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   Procedure: TRANSMETATARSAL AMPUTATION;  Surgeon: Felipa Furnace, DPM;  Location: [facility];  Service: Podiatry;  Laterality: Left;    Social History:  reports that she quit smoking about 16 years ago. Her smoking use included cigarettes. She has never used smokeless tobacco. She reports that she does not drink alcohol and does not use drugs. Family History:  Family History  Problem Relation Age of Onset   Diabetes Sister        3/5 sisters have DM   Breast cancer Neg Hx    Colon cancer Neg Hx      HOME MEDICATIONS: Allergies as of 07/20/2021   No Known Allergies      Medication List        Accurate as of July 20, 2021 11:06 AM. If you have any questions, ask your nurse or doctor.          aspirin 81 MG EC tablet Take 1 tablet (81 mg total)  by mouth daily. Swallow whole.   B-D ULTRAFINE III SHORT PEN 31G X 8 MM Misc Generic drug: Insulin Pen Needle USE AS DIRECTED   carvedilol 12.5 MG tablet Commonly known as: COREG Take 1 tablet (12.5 mg total) by mouth 2 (two) times daily with a meal. Take one tablet by mouth daily with a meal.   dapagliflozin propanediol 10 MG Tabs tablet Commonly known as: FARXIGA Take 10 mg by mouth daily.   Dexcom G6 Sensor Misc Place a new sensor every 10 days. Use to check blood sugar at least 4 times daily   Dexcom G6 Transmitter Misc Use to check glucose at least 4 times daily   enalapril 20 MG tablet Commonly known as: VASOTEC Take 2 tablets (40 mg total) by mouth daily.   hydrALAZINE 10 MG tablet Commonly known as: APRESOLINE TAKE 1/2 TABLET BY MOUTH THREE TIMES DAILY AS NEEDED(TAKE IF BLOOD PRESSURE IS GREATER THAN 140/90)   insulin glargine 100 UNIT/ML Solostar Pen Commonly known as: LANTUS Inject 40 Units into the skin daily.   multivitamin capsule Take 1 capsule by mouth daily.   mupirocin ointment 2 % Commonly known as: BACTROBAN Apply 1 application topically 2 (two) times daily.   NIFEdipine 30 MG 24 hr tablet Commonly known as: PROCARDIA-XL/NIFEDICAL-XL Take 1 tablet (30 mg total) by mouth daily.   NovoLOG FlexPen 100 UNIT/ML FlexPen Generic drug: insulin aspart Inject 25 Units into the skin 3 (three) times daily with meals. Take 5 units at breakfast, 10 units at lunch and 10 units at dinner.   rosuvastatin 10 MG tablet Commonly known as: Crestor Take 1 tablet (10 mg total) by mouth daily.   Vitamin D (Cholecalciferol) 10 MCG (400 UNIT) Caps Take 1 capsule by mouth daily.         ALLERGIES: No Known Allergies   REVIEW OF SYSTEMS: A comprehensive ROS was conducted with the patient and is negative except as per HPI and below:  Review of Systems  Gastrointestinal:  Negative for diarrhea, nausea and vomiting.     OBJECTIVE:   VITAL SIGNS: BP 122/80  (BP Location: Left Arm, Patient Position: Sitting, Cuff Size: Small)    Pulse 80    Ht 5\' 5"  (1.651 m)    Wt 188 lb 9.6 oz (85.5 kg)    SpO2 98%    BMI 31.38 kg/m    PHYSICAL EXAM:  General: Pt appears well and is in NAD  Neck: General: Supple without adenopathy or carotid bruits. Thyroid: Thyroid size normal.  No goiter or nodules appreciated.   Lungs: Clear with good BS bilat with no rales, rhonchi, or wheezes  Heart: RRR with normal S1 and S2  and no gallops; no murmurs; no rub  Abdomen: Normoactive bowel sounds, soft, nontender, without masses or organomegaly palpable  Extremities:  Lower extremities - Trace edema on the left, left foot boot in place. No edema on the right   Neuro: MS is good with appropriate affect, pt is alert and Ox3     DATA REVIEWED:  Lab Results  Component Value Date   HGBA1C 7.4 (H) 07/11/2021   HGBA1C 7.8 (H) 04/10/2021   HGBA1C 8.0 02/06/2021   Lab Results  Component Value Date   MICROALBUR 41.9 (H) 07/11/2021   LDLCALC 42 04/12/2021   CREATININE 2.17 (H) 05/24/2021   Lab Results  Component Value Date   MICRALBCREAT 75.1 (H) 07/11/2021    Lab Results  Component Value Date   CHOL 84 04/12/2021   HDL 25 (L) 04/12/2021   LDLCALC 42 04/12/2021   LDLDIRECT 84.9 09/10/2012   TRIG 87 04/12/2021   CHOLHDL 3.4 04/12/2021        ASSESSMENT / PLAN / RECOMMENDATIONS:   1) Type 2 Diabetes Mellitus, Sub-Optimally controlled, With retinopathic, CKD IV and macrovascular complications - Most recent A1c of 7.4 %. Goal A1c < 7.0 %.    - Her A1c is skewed due to CKD - Discussed importance of glycemic control to prevent worsening of diabetic retinopathy, CKD, and to prevent any more amputations - Declines GLP-1 agonists  - Declines insulin pump  -Her GFR is less than 25, hence there is no indication from the endocrinology standpoint to be on SGLT2 inhibitors, per patient this is prescribed by her nephrology so we will defer this to nephrology. -I am going  to adjust her insulin as below and provide her with a correction scale -Due to hypoglycemia overnight and sometimes fasting I am going to reduce her Lantus dose  MEDICATIONS: Decrease Lantus to 36 units daily NovoLog 5 units with breakfast, 10 units with lunch, and 12 units with supper Correction factor: NovoLog (BG -130/30)   EDUCATION / INSTRUCTIONS: BG monitoring instructions: Patient is instructed to check her blood sugars 3 times a day, before meals. Call [facility] if: BG persistently < 70 . I reviewed the Rule of 15 for the treatment of hypoglycemia in detail with the patient. Literature supplied.   2) Diabetic complications:  Eye: Does  have known diabetic retinopathy.  Neuro/ Feet: Does not have known diabetic peripheral neuropathy. Renal: Patient does have known baseline CKD. She is on an ACEI/ARB at present.   Follow-up in 4 months    Signed electronically by: Mack Guise, MD  [facility]  [facility] [address] [place], [place] [address] Phone: [phone] FAX: [phone]   CC: Burnard Hawthorne, FNP [address] [place] [address] Phone: [phone]  Fax: [phone]    Return to Endocrinology clinic as below: Future Appointments  Date Time Provider Damar  07/20/2021 11:10 AM Kalvyn Desa, Melanie Crazier, MD [facility] None  07/23/2021  9:15 AM [name], DPM [facility]  08/02/2021 11:15 AM [facility]MM CARE MANAGER [facility] None  08/22/2021 10:00 AM [facility] VASC 1 [facility] None  08/22/2021 11:00 AM [facility] VASC 1 [facility] None  08/22/2021 11:30 AM Kris Hartmann, NP [facility] None  08/27/2021  9:00 AM Burnard Hawthorne, FNP [facility]  09/07/2021  8:40 AM Kate Sable, MD [facility]  10/01/2021  9:00 AM [facility] VASC 2 [facility] None  10/01/2021 10:00 AM Kris Hartmann, NP [facility] None

## 2021-07-18 ENCOUNTER — Other Ambulatory Visit: Payer: Self-pay

## 2021-07-18 ENCOUNTER — Ambulatory Visit (INDEPENDENT_AMBULATORY_CARE_PROVIDER_SITE_OTHER): Payer: Medicaid Other

## 2021-07-18 DIAGNOSIS — I340 Nonrheumatic mitral (valve) insufficiency: Secondary | ICD-10-CM

## 2021-07-18 LAB — ECHOCARDIOGRAM COMPLETE
AR max vel: 2.09 cm2
AV Area VTI: 2.04 cm2
AV Area mean vel: 1.97 cm2
AV Mean grad: 5 mmHg
AV Peak grad: 8.6 mmHg
Ao pk vel: 1.47 m/s
Area-P 1/2: 2.08 cm2
Calc EF: 63.7 %
S' Lateral: 3.7 cm
Single Plane A2C EF: 59.3 %
Single Plane A4C EF: 64.9 %

## 2021-07-20 ENCOUNTER — Ambulatory Visit (INDEPENDENT_AMBULATORY_CARE_PROVIDER_SITE_OTHER): Payer: Medicaid Other | Admitting: Internal Medicine

## 2021-07-20 ENCOUNTER — Other Ambulatory Visit: Payer: Self-pay

## 2021-07-20 ENCOUNTER — Encounter: Payer: Self-pay | Admitting: Internal Medicine

## 2021-07-20 VITALS — BP 122/80 | HR 80 | Ht 65.0 in | Wt 188.6 lb

## 2021-07-20 DIAGNOSIS — E1122 Type 2 diabetes mellitus with diabetic chronic kidney disease: Secondary | ICD-10-CM | POA: Insufficient documentation

## 2021-07-20 DIAGNOSIS — E1159 Type 2 diabetes mellitus with other circulatory complications: Secondary | ICD-10-CM | POA: Diagnosis not present

## 2021-07-20 DIAGNOSIS — E1039 Type 1 diabetes mellitus with other diabetic ophthalmic complication: Secondary | ICD-10-CM

## 2021-07-20 DIAGNOSIS — E1139 Type 2 diabetes mellitus with other diabetic ophthalmic complication: Secondary | ICD-10-CM

## 2021-07-20 DIAGNOSIS — N184 Chronic kidney disease, stage 4 (severe): Secondary | ICD-10-CM

## 2021-07-20 DIAGNOSIS — E11319 Type 2 diabetes mellitus with unspecified diabetic retinopathy without macular edema: Secondary | ICD-10-CM

## 2021-07-20 DIAGNOSIS — Z794 Long term (current) use of insulin: Secondary | ICD-10-CM

## 2021-07-20 DIAGNOSIS — E119 Type 2 diabetes mellitus without complications: Secondary | ICD-10-CM | POA: Insufficient documentation

## 2021-07-20 MED ORDER — DEXCOM G6 TRANSMITTER MISC: 1.0000 | 3 refills | Status: DC

## 2021-07-20 MED ORDER — NOVOLOG FLEXPEN 100 UNIT/ML ~~LOC~~ SOPN: PEN_INJECTOR | SUBCUTANEOUS | 6 refills | Status: DC

## 2021-07-20 MED ORDER — DEXCOM G6 SENSOR MISC: 1.0000 | 3 refills | Status: DC

## 2021-07-20 MED ORDER — INSULIN GLARGINE 100 UNIT/ML SOLOSTAR PEN
36.0000 [IU] | PEN_INJECTOR | Freq: Every day | SUBCUTANEOUS | 6 refills | Status: DC
Start: 2021-07-20 — End: 2022-09-06

## 2021-07-20 MED ORDER — INSULIN PEN NEEDLE 31G X 5 MM MISC: 1.0000 | Freq: Four times a day (QID) | 2 refills | Status: DC

## 2021-07-20 NOTE — Addendum Note (Signed)
Addended by: Dorita Sciara on: 07/20/2021 01:09 PM   Modules accepted: Orders

## 2021-07-20 NOTE — Patient Instructions (Addendum)
-   Decrease Lantus to 36 units daily  - Novolog 5 units with Breakfast, 10 units with Lunch and 12 units with Supper  - Novolog correctional insulin: ADD extra units on insulin to your meal-time Novolog dose if your blood sugars are higher than 160. Use the scale below to help guide you:   Blood sugar before meal Number of units to inject  Less than 160 0 unit  161 -  190 1 units  191 -  220 2 units  221 -  250 3 units  251 -  280 4 units  281 -  310 5 units  311 -  340 6 units  341 -  370 7 units  371 -  400 8 units   HOW TO TREAT LOW BLOOD SUGARS (Blood sugar LESS THAN 70 MG/DL) Please follow the RULE OF 15 for the treatment of hypoglycemia treatment (when your (blood sugars are less than 70 mg/dL)   STEP 1: Take 15 grams of carbohydrates when your blood sugar is low, which includes:  3-4 GLUCOSE TABS  OR 3-4 OZ OF JUICE OR REGULAR SODA OR ONE TUBE OF GLUCOSE GEL    STEP 2: RECHECK blood sugar in 15 MINUTES STEP 3: If your blood sugar is still low at the 15 minute recheck --> then, go back to STEP 1 and treat AGAIN with another 15 grams of carbohydrates.

## 2021-07-20 NOTE — Addendum Note (Signed)
Addended by: Dorita Sciara on: 07/20/2021 01:08 PM   Modules accepted: Orders

## 2021-07-23 ENCOUNTER — Encounter: Payer: Self-pay | Admitting: Podiatry

## 2021-07-23 ENCOUNTER — Other Ambulatory Visit: Payer: Self-pay

## 2021-07-23 ENCOUNTER — Ambulatory Visit (INDEPENDENT_AMBULATORY_CARE_PROVIDER_SITE_OTHER): Payer: Medicaid Other | Admitting: Podiatry

## 2021-07-23 VITALS — Temp 97.5°F

## 2021-07-23 DIAGNOSIS — L97422 Non-pressure chronic ulcer of left heel and midfoot with fat layer exposed: Secondary | ICD-10-CM

## 2021-07-23 DIAGNOSIS — B351 Tinea unguium: Secondary | ICD-10-CM | POA: Diagnosis not present

## 2021-07-23 DIAGNOSIS — M79674 Pain in right toe(s): Secondary | ICD-10-CM

## 2021-07-23 DIAGNOSIS — E13621 Other specified diabetes mellitus with foot ulcer: Secondary | ICD-10-CM

## 2021-07-23 MED ORDER — GENTAMICIN SULFATE 0.1 % EX OINT: 1.0000 "application " | TOPICAL_OINTMENT | Freq: Every day | CUTANEOUS | 2 refills | Status: DC

## 2021-07-23 NOTE — Progress Notes (Signed)
°  Subjective:  Patient ID: Chelsea Barton, female    DOB: 1961/01/20,  MRN: [id number]  Chief Complaint  Patient presents with   Wound Check    "It's good.  My toenails need to be cut."    61 y.o. female presents with the above complaint. History confirmed with patient.  Denies fevers chills nausea vomiting  Objective:  Physical Exam: Skin flaps warm and well perfused.  Open ulceration with fiber granular wound bed measuring 3.8 x 1.3 x 0.5 cmcm left foot, fibrogranular wound bed, no gangrene signs of infection purulence or malodor is noted          Assessment:   1. 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 Pain due to onychomycosis of toenail of right foot      Plan:  Patient was evaluated and treated and all questions answered.  Ulcer left foot -We discussed the etiology and factors that are a part of the wound healing process.  We also discussed the risk of infection both soft tissue and osteomyelitis from open ulceration.  Discussed the risk of limb loss if this happens or worsens. -Debridement as below. -Dressed with Prisma and DSD.  Continue this at home.  Dressing supplies given today -Continue surgical shoe offloading -So far responding well to Prisma  Procedure: Excisional Debridement of Wound Rationale: Removal of non-viable soft tissue from the wound to promote healing.  Anesthesia: none Post-Debridement Wound Measurements: 3.8 x 1.3 x 0.5 cm Type of Debridement: Sharp Excisional Tissue Removed: Non-viable soft tissue Depth of Debridement: To level of bone Technique: Sharp excisional debridement to bleeding, viable wound base.  Dressing: Dry, sterile, compression dressing. Disposition: Patient tolerated procedure well. Patient to return in 2 week for follow-up.   Discussed the etiology and treatment options for the condition in detail with the patient. Educated patient on the topical  and oral treatment options for mycotic nails. Recommended debridement of the nails today. Sharp and mechanical debridement performed of all painful and mycotic nails today. Nails debrided in length and thickness using a nail nipper to level of comfort. Discussed treatment options including appropriate shoe gear. Follow up as needed for painful nails.     Return in about 3 weeks (around 08/13/2021) for wound care.

## 2021-07-24 ENCOUNTER — Other Ambulatory Visit (HOSPITAL_COMMUNITY): Payer: Self-pay

## 2021-07-25 ENCOUNTER — Telehealth: Payer: Self-pay

## 2021-07-25 ENCOUNTER — Other Ambulatory Visit (HOSPITAL_COMMUNITY): Payer: Self-pay

## 2021-07-25 NOTE — Telephone Encounter (Signed)
Patient Advocate Encounter   Received notification from [facility] that prior authorization for BD pen needles mini 31gx60mm is required by his/her insurance [employer].   PA submitted on 07/25/21  Key#: [id number]  Status is pending    [facility] will continue to follow:  Patient Advocate Fax: [phone]

## 2021-07-26 ENCOUNTER — Other Ambulatory Visit (HOSPITAL_COMMUNITY): Payer: Self-pay

## 2021-07-26 MED ORDER — EASY TOUCH PEN NEEDLES 31G X 5 MM MISC: 2 refills | Status: DC

## 2021-07-26 NOTE — Telephone Encounter (Signed)
Received notification from [facility]) regarding a prior authorization for BD PEN NEEDLES 31GX5MM.   Prior authorization not needed. Pt's plan does not cover BD pen needles. They cover EasyTouch Pen needles. Pt pharmacy will order.

## 2021-07-26 NOTE — Addendum Note (Signed)
Addended by: Jefferson Fuel on: 07/26/2021 12:53 PM   Modules accepted: Orders

## 2021-07-30 ENCOUNTER — Other Ambulatory Visit: Payer: Self-pay | Admitting: Family

## 2021-08-02 ENCOUNTER — Other Ambulatory Visit: Payer: Self-pay | Admitting: *Deleted

## 2021-08-02 NOTE — Patient Outreach (Signed)
Care Coordination  08/02/2021  [name] [birth year] [id number]   Medicaid Managed Care   Unsuccessful Outreach Note  08/02/2021 Name: Chelsea Barton MRN: [id number] DOB: 1960-09-04  Referred by: Burnard Hawthorne, FNP Reason for referral : High Risk Managed Medicaid (Unsuccessful RNCM follow up telephone outreach)   An unsuccessful telephone outreach was attempted today. The patient was referred to the case management team for assistance with care management and care coordination.   Follow Up Plan: A HIPAA compliant phone message was left for the patient providing contact information and requesting a return call.   [name] RN, BSN [place] RN Care Coordinator

## 2021-08-02 NOTE — Patient Instructions (Signed)
Visit Information  [name]  - as a part of your Medicaid benefit, you are eligible for care management and care coordination services at no cost or copay. I was unable to reach you by phone today but would be happy to help you with your health related needs. Please feel free to call me @ [phone].   A member of the Managed Medicaid care management team will reach out to you again over the next 14 days.   [name] RN, BSN [place] RN Care Coordinator

## 2021-08-06 ENCOUNTER — Telehealth: Payer: Self-pay | Admitting: *Deleted

## 2021-08-06 NOTE — Telephone Encounter (Signed)
Called patient to schedule 24 hour ambulatory blood pressure monitor ordered by Dr. Garen Lah.  Patient declined test stating she told them, she has a blood pressure monitor at home and could take her own.  She stated her blood pressure was only elevated because she had to walk so far to get to his office.   Patient also stated she does not have transportation to come to [place].   The order for the 24 hour ambulatory blood pressure monitor will be cancelled. Informed patient [facility] does offer transportation assistance.  She will need to let the nurse know she will need this assistance, when her physician requests a test or appointment where she would not have transportation available.

## 2021-08-09 ENCOUNTER — Other Ambulatory Visit: Payer: Self-pay | Admitting: *Deleted

## 2021-08-09 ENCOUNTER — Other Ambulatory Visit: Payer: Self-pay

## 2021-08-09 NOTE — Patient Outreach (Signed)
Medicaid Managed Care   Nurse Care Manager Note  08/09/2021 Name:  Chelsea Barton MRN:  [id number] DOB:  [birth year]  Chelsea Barton is an 61 y.o. year old female who is a primary patient of Burnard Hawthorne, FNP.  The [facility] Managed Care Coordination team was consulted for assistance with:    DMII and wound care  Chelsea Barton was given information about Medicaid Managed Care Coordination team services today. Chelsea Barton Patient agreed to services and verbal consent obtained.  Engaged with patient by telephone for follow up visit in response to provider referral for case management and/or care coordination services.   Assessments/Interventions:  Review of past medical history, allergies, medications, health status, including review of consultants reports, laboratory and other test data, was performed as part of comprehensive evaluation and provision of chronic care management services.  SDOH (Social Determinants of Health) assessments and interventions performed: SDOH Interventions    Flowsheet Row Most Recent Value  SDOH Interventions   Transportation Interventions Other (Comment)  [Patient needs transportation to Endocrinology in [place], Provided patient with [facility] medical transportation information [phone] and details of next appointment]       Care Plan  No Known Allergies  Medications Reviewed Today     Reviewed by Melissa Montane, RN (Registered Nurse) on 08/09/21 at [facility] List Status: <None>   Medication Order Taking? Sig Documenting Provider Last Dose Status Informant  aspirin 81 MG EC tablet [id number] Yes Take 1 tablet (81 mg total) by mouth daily. Swallow whole. Burnard Hawthorne, FNP Taking Active Self  carvedilol (COREG) 12.5 MG tablet [id number] Yes Take 1 tablet (12.5 mg total) by mouth 2 (two) times daily with a meal. Take one tablet by mouth daily with a meal. Burnard Hawthorne, FNP Taking Active   Continuous Blood Gluc Sensor (DEXCOM G6 SENSOR)  MISC [id number] Yes Inject 1 Device into the skin as directed. Every Shamleffer, Ludivina Guymon Crazier, MD Taking Active   Continuous Blood Gluc Transmit (DEXCOM G6 TRANSMITTER) MISC [id number] Yes Inject 1 Device into the skin as directed. Every 10 days Shamleffer, Coron Rossano Crazier, MD Taking Active   dapagliflozin propanediol (FARXIGA) 10 MG TABS tablet [id number] Yes Take 10 mg by mouth daily. [provider] Taking Active Self  enalapril (VASOTEC) 20 MG tablet [id number] Yes Take 2 tablets (40 mg total) by mouth daily. Burnard Hawthorne, FNP Taking Active   gentamicin ointment (GARAMYCIN) 0.1 % [id number] Yes Apply 1 application topically daily. [name], DPM Taking Active   hydrALAZINE (APRESOLINE) 10 MG tablet [id number] Yes TAKE 1/2 TABLET BY MOUTH THREE TIMES DAILY AS NEEDED(IF BLOOD PRESSURE IS GREATER THAN 140/90) Burnard Hawthorne, FNP Taking Active   insulin glargine (LANTUS) 100 UNIT/ML Solostar Pen [id number] Yes Inject 36 Units into the skin daily. Shamleffer, Saroya Riccobono Crazier, MD Taking Active   Insulin Pen Needle (EASY TOUCH PEN NEEDLES) 31G X 5 MM MISC [id number] Yes Check sugar 3-4 daily Shamleffer, Kaylanie Capili Crazier, MD Taking Active   Insulin Pen Needle 31G X 5 MM MISC [id number] Yes 1 Device by Does not apply route in the morning, at noon, in the evening, and at bedtime. Shamleffer, Halea Lieb Crazier, MD Taking Active   Multiple Vitamin (MULTIVITAMIN) capsule [id number] Yes Take 1 capsule by mouth daily. [provider] Taking Active Self  NIFEdipine (PROCARDIA-XL/NIFEDICAL-XL) 30 MG 24 hr tablet [id number] Yes Take 1 tablet (30 mg total) by mouth daily. Burnard Hawthorne, FNP Taking Active   NOVOLOG FLEXPEN 100 UNIT/ML  FlexPen [id number] Yes Max daily 40 units Shamleffer, Sherill Wegener Crazier, MD Taking Active   rosuvastatin (CRESTOR) 10 MG tablet [id number] Yes Take 1 tablet (10 mg total) by mouth daily. Burnard Hawthorne, FNP Taking Active   Vitamin D, Cholecalciferol, 10 MCG  (400 UNIT) CAPS [id number] Yes Take 1 capsule by mouth daily. [provider] Taking Active Self            Patient Active Problem List   Diagnosis Date Noted   Type 2 diabetes mellitus with retinopathy, with long-term current use of insulin ([place]) 07/20/2021   Type 2 diabetes mellitus with stage 4 chronic kidney disease, with long-term current use of insulin ([place]) 07/20/2021   Diabetes mellitus ([place]) 07/20/2021   PAD (peripheral artery disease) ([place]) 05/09/2021   Myonecrosis ([place])    Osteomyelitis of foot, left, acute ([place]) 04/11/2021   Left foot amputee ([place]) 04/10/2021   LVH (left ventricular hypertrophy) due to hypertensive disease, without heart failure 09/04/2020   Chronic kidney disease, stage IV (severe) ([place]) 08/15/2020   Aortic atherosclerosis ([place]) 06/22/2020   Benign hypertensive kidney disease with chronic kidney disease 08/17/2019   Proteinuria 08/17/2019   Secondary hyperparathyroidism of renal origin ([place]) 08/17/2019   Proliferative diabetic retinopathy associated with type 1 diabetes mellitus ([place]) 03/25/2019   Esophageal thickening 03/18/2019   Pain due to onychomycosis of toenails of both feet 12/14/2018   Diabetic neuropathy ([place]) 12/14/2018   Long-term insulin use ([place]) 08/03/2018   CKD (chronic kidney disease) 11/20/2016   CAD (coronary artery disease) 11/20/2016   Mitral valve disease 11/20/2016   Impaired vision 11/20/2016   Moderate mitral insufficiency 03/06/2015   DM type 1 with diabetic peripheral neuropathy ([place]) 03/01/2015   Uncontrolled type 1 diabetes mellitus with hyperglycemia, with long-term current use of insulin ([place]) 03/01/2015   Benign essential hypertension 10/03/2014   Routine general medical examination at a health care facility 12/14/2012   Obesity 12/14/2012   Hypertension 04/01/2011   Hyperlipidemia 04/01/2011   DM type 1 (diabetes mellitus, type 1) ([place]) 04/01/2011    Conditions to be addressed/monitored per PCP order:   DMII and wound care  Care Plan : RN Care Manager Plan of Care  Updates made by Melissa Montane, RN since 08/09/2021 12:00 AM     Problem: Health Management Needs Related to Wound Care and DMI      Long-Range Goal: Development of Plan of Care Addressing Health Management Needs Related to Wound Care and DMI   Start Date: 05/31/2021  Expected End Date: 09/11/2021  Priority: High  Note:   Current Barriers:  Chronic Disease Management support and education needs related to Wound Care and DMI Chelsea Barton reports her wound is healing good. She attended visit with new Endocrinologist and has concern about needing transportation to [facility] for future appointments. She reports good blood sugar readings with occasional lows in the 40's after dinner. She is checking her BP at home, last reading 116/60.  RNCM Clinical Goal(s):  Patient will verbalize understanding of plan for management of Wound Care and DMI as evidenced by patient verbalization of self monitoring activities attend all scheduled medical appointments: 08/15/21 with [facility], 08/22/21 with [facility], 08/27/21 with PCP, 09/07/21 with Cardiology, 10/01/21 with [facility] and 11/21/21 with [facility] as evidenced by provider documentation in EMR        demonstrate improved adherence to prescribed treatment plan for Wound Care and DMI as evidenced by documentation in EMR work with community resource care guide to address needs related  to Limited access to food as evidenced by patient and/or community resource care guide support    through collaboration with Consulting civil engineer, provider, and care team.   Interventions: Inter-disciplinary care team collaboration (see longitudinal plan of care) Evaluation of current treatment plan related to  self management and patient's adherence to plan as established by provider Provided patient with [facility] transportation [phone] and details of appointment [facility] [place], [place], [place] [address],  phone [phone] to call 1 week before her appointment   Diabetes:  (Status: Goal on Track (progressing): YES.) Long Term Goal   Lab Results  Component Value Date   HGBA1C 7.4 (H) 07/11/2021  Assessed patient's understanding of A1c goal: <7% Reviewed medications with patient and discussed importance of medication adherence;        Reviewed prescribed diet with patient provided diabetic diet education; Provided patient with written educational materials related to hypo and hyperglycemia and importance of correct treatment;       Reviewed scheduled/upcoming provider appointments including: see appointments listed above;         call provider for findings outside established parameters;       Review of patient status, including review of consultants reports, relevant laboratory and other test results, and medications completed;       Assessed social determinant of health barriers;        Advised patient to write down when she has lows after dinner, what she had to eat and take this log to next Endocrinology visit  Wound Care  (Status: Goal Met.) Long Term Goal  Evaluation of current treatment plan related to  Wound Care , Limited access to food self-management and patient's adherence to plan as established by provider. Discussed plans with patient for ongoing care management follow up and provided patient with direct contact information for care management team Provided education to patient re: wound care; Reviewed medications with patient and discussed taking all antibiotics as directed; Reviewed scheduled/upcoming provider appointments including see appointments listed above; Assessed social determinant of health barriers;   Patient Goals/Self-Care Activities: Take medications as prescribed   Attend all scheduled provider appointments Call pharmacy for medication refills 3-7 days in advance of running out of medications Attend church or other social activities Perform all self  care activities independently  Perform IADL's (shopping, preparing meals, housekeeping, managing finances) independently Call provider office for new concerns or questions  take the blood sugar meter to all doctor visits drink 6 to 8 glasses of water each day fill half of plate with vegetables manage portion size       Follow Up:  Patient agrees to Care Plan and Follow-up.  Plan: The Managed Medicaid care management team will reach out to the patient again over the next 30 days.  Date/time of next scheduled RN care management/care coordination outreach:  09/11/21 @ 12:30pm  [name] RN, BSN [place] RN Care Coordinator

## 2021-08-09 NOTE — Patient Instructions (Signed)
Visit Information  Chelsea Barton was given information about Medicaid Managed Care team care coordination services as a part of their [place] Medicaid benefit. Chelsea Barton verbally consented to engagement with the [facility] Managed Care team.   If you are experiencing a medical emergency, please call 911 or report to your local emergency department or urgent care.   If you have a non-emergency medical problem during routine business hours, please contact your provider's office and ask to speak with a nurse.   For questions related to your [facility], please call: [phone] or visit the homepage here: [email or web address]  If you would like to schedule transportation through your [facility], please call the following number at least 2 days in advance of your appointment: [phone].  Rides for urgent appointments can also be made after hours by calling Member Services.  Call the [place] at [phone], at any time, 24 hours a day, 7 days a week. If you are in danger or need immediate medical attention call 911.  If you would like help to quit smoking, call 1-800-QUIT-NOW 224-608-9763) OR Espaol: 1-855-Djelo-Ya (2-800-349-1791) o para ms informacin haga clic aqu or Text READY to 200-400 to register via text  Ms. Lohnes - following are the goals we discussed in your visit today:   Goals Addressed   None     Please see education materials related to diabetes provided as print materials.   The patient verbalized understanding of instructions, educational materials, and care plan provided today and agreed to receive a mailed copy of patient instructions, educational materials, and care plan.   Telephone follow up appointment with Managed Medicaid care management team member scheduled for:09/11/21 @ 12:30pm  [name] RN,  BSN [place] RN Care Coordinator   Following is a copy of your plan of care:  Care Plan : RN Care Manager Plan of Care  Updates made by Melissa Montane, RN since 08/09/2021 12:00 AM     Problem: Health Management Needs Related to Wound Care and DMI      Long-Range Goal: Development of Plan of Care Addressing Health Management Needs Related to Wound Care and DMI   Start Date: 05/31/2021  Expected End Date: 09/11/2021  Priority: High  Note:   Current Barriers:  Chronic Disease Management support and education needs related to Wound Care and DMI Chelsea Barton reports her wound is healing good. She attended visit with new Endocrinologist and has concern about needing transportation to [facility] for future appointments. She reports good blood sugar readings with occasional lows in the 40's after dinner. She is checking her BP at home, last reading 116/60.  RNCM Clinical Goal(s):  Patient will verbalize understanding of plan for management of Wound Care and DMI as evidenced by patient verbalization of self monitoring activities attend all scheduled medical appointments: 08/15/21 with [facility], 08/22/21 with [facility], 08/27/21 with PCP, 09/07/21 with Cardiology, 10/01/21 with [facility] and 11/21/21 with [facility] as evidenced by provider documentation in EMR        demonstrate improved adherence to prescribed treatment plan for Wound Care and DMI as evidenced by documentation in EMR work with community resource care guide to address needs related to Limited access to food as evidenced by patient and/or community resource care guide support    through collaboration with Consulting civil engineer, provider, and care team.   Interventions: Inter-disciplinary care team collaboration (see longitudinal plan  of care) Evaluation of current treatment plan related to  self management and patient's adherence to plan as established by provider Provided patient with [facility] transportation [phone] and  details of appointment [facility] [place], [place], [place] [address], phone [phone] to call 1 week before her appointment   Diabetes:  (Status: Goal on Track (progressing): YES.) Long Term Goal   Lab Results  Component Value Date   HGBA1C 7.4 (H) 07/11/2021  Assessed patient's understanding of A1c goal: <7% Reviewed medications with patient and discussed importance of medication adherence;        Reviewed prescribed diet with patient provided diabetic diet education; Provided patient with written educational materials related to hypo and hyperglycemia and importance of correct treatment;       Reviewed scheduled/upcoming provider appointments including: see appointments listed above;         call provider for findings outside established parameters;       Review of patient status, including review of consultants reports, relevant laboratory and other test results, and medications completed;       Assessed social determinant of health barriers;        Advised patient to write down when she has lows after dinner, what she had to eat and take this log to next Endocrinology visit  Wound Care  (Status: Goal Met.) Long Term Goal  Evaluation of current treatment plan related to  Wound Care , Limited access to food self-management and patient's adherence to plan as established by provider. Discussed plans with patient for ongoing care management follow up and provided patient with direct contact information for care management team Provided education to patient re: wound care; Reviewed medications with patient and discussed taking all antibiotics as directed; Reviewed scheduled/upcoming provider appointments including see appointments listed above; Assessed social determinant of health barriers;   Patient Goals/Self-Care Activities: Take medications as prescribed   Attend all scheduled provider appointments Call pharmacy for medication refills 3-7 days in advance of  running out of medications Attend church or other social activities Perform all self care activities independently  Perform IADL's (shopping, preparing meals, housekeeping, managing finances) independently Call provider office for new concerns or questions  take the blood sugar meter to all doctor visits drink 6 to 8 glasses of water each day fill half of plate with vegetables manage portion size

## 2021-08-14 DIAGNOSIS — R531 Weakness: Secondary | ICD-10-CM | POA: Diagnosis not present

## 2021-08-15 ENCOUNTER — Ambulatory Visit: Payer: Medicaid Other | Admitting: Podiatry

## 2021-08-15 ENCOUNTER — Other Ambulatory Visit: Payer: Self-pay

## 2021-08-15 DIAGNOSIS — L97422 Non-pressure chronic ulcer of left heel and midfoot with fat layer exposed: Secondary | ICD-10-CM | POA: Diagnosis not present

## 2021-08-15 DIAGNOSIS — E13621 Other specified diabetes mellitus with foot ulcer: Secondary | ICD-10-CM

## 2021-08-20 NOTE — Progress Notes (Signed)
?  Subjective:  ?Patient ID: Chelsea Barton, female    DOB: 09/12/1960,  MRN: [id number] ? ?Chief Complaint  ?Patient presents with  ? Wound Check  ? ? ?61 y.o. female presents with the above complaint. History confirmed with patient.  Denies fevers chills nausea vomiting ? ?Objective:  ?Physical Exam: ?Skin flaps warm and well perfused.  Open ulceration with fiber granular wound bed measuring 3.0 x 2.0 x 0.5 cm left foot, fibrogranular wound bed, no gangrene signs of infection purulence or malodor is noted ? ? ? ? ? ? ? ? ? ? ?Assessment:  ? ?No diagnosis found. ? ? ? ?Plan:  ?Patient was evaluated and treated and all questions answered. ? ?Ulcer left foot ?-We discussed the etiology and factors that are a part of the wound healing process.  We also discussed the risk of infection both soft tissue and osteomyelitis from open ulceration.  Discussed the risk of limb loss if this happens or worsens. ?-Debridement as below. ?-Dressed with Prisma and DSD.  Continue this at home.  Dressing supplies given today ?-Continue surgical shoe offloading ?-So far responding well to Prisma ? ?Procedure: Excisional Debridement of Wound ?Rationale: Removal of non-viable soft tissue from the wound to promote healing.  ?Anesthesia: none ?Post-Debridement Wound Measurements: 3.0 x 2.0 x 0.5 cm ?Type of Debridement: Sharp Excisional ?Tissue Removed: Non-viable soft tissue ?Depth of Debridement: To level of bone ?Technique: Sharp excisional debridement to bleeding, viable wound base.  ?Dressing: Dry, sterile, compression dressing. ?Disposition: Patient tolerated procedure well. Patient to return in 2 week for follow-up. ? ? ? ? ?Return in about 3 weeks (around 09/05/2021) for wound care.  ? ? ?

## 2021-08-22 ENCOUNTER — Ambulatory Visit (INDEPENDENT_AMBULATORY_CARE_PROVIDER_SITE_OTHER): Payer: Medicaid Other | Admitting: Nurse Practitioner

## 2021-08-22 ENCOUNTER — Encounter (INDEPENDENT_AMBULATORY_CARE_PROVIDER_SITE_OTHER): Payer: Medicaid Other

## 2021-08-27 ENCOUNTER — Ambulatory Visit (INDEPENDENT_AMBULATORY_CARE_PROVIDER_SITE_OTHER): Payer: Medicaid Other | Admitting: Family

## 2021-08-27 ENCOUNTER — Other Ambulatory Visit: Payer: Self-pay

## 2021-08-27 ENCOUNTER — Encounter: Payer: Self-pay | Admitting: Family

## 2021-08-27 DIAGNOSIS — I10 Essential (primary) hypertension: Secondary | ICD-10-CM | POA: Diagnosis not present

## 2021-08-27 DIAGNOSIS — E1039 Type 1 diabetes mellitus with other diabetic ophthalmic complication: Secondary | ICD-10-CM

## 2021-08-27 LAB — BASIC METABOLIC PANEL
BUN: 37 mg/dL — ABNORMAL HIGH (ref 6–23)
CO2: 31 mEq/L (ref 19–32)
Calcium: 9.4 mg/dL (ref 8.4–10.5)
Chloride: 103 mEq/L (ref 96–112)
Creatinine, Ser: 2.3 mg/dL — ABNORMAL HIGH (ref 0.40–1.20)
GFR: 22.3 mL/min — ABNORMAL LOW (ref >60.00)
Glucose, Bld: 63 mg/dL — ABNORMAL LOW (ref 70–99)
Potassium: 4.9 mEq/L (ref 3.5–5.1)
Sodium: 140 mEq/L (ref 135–145)

## 2021-08-27 MED ORDER — DEXCOM G6 SENSOR MISC: 1.0000 | 3 refills | Status: DC

## 2021-08-27 MED ORDER — ACCU-CHEK AVIVA PLUS VI STRP: ORAL_STRIP | 12 refills | Status: AC

## 2021-08-27 NOTE — Patient Instructions (Signed)
Nice to see you!   

## 2021-08-27 NOTE — Assessment & Plan Note (Signed)
Well-controlled, continue nifedipine to 30 mg daily, continue Coreg 12.5 mg twice daily, Vasotec 20 mg twice daily.  She is following with Dr. Charlestine Night, cardiology ?

## 2021-08-27 NOTE — Progress Notes (Signed)
? ?Subjective:  ? ? Patient ID: Chelsea Barton, female    DOB: [birth year], 61 y.o.   MRN: [id number] ? ?CC: Chelsea Barton is a 61 y.o. female who presents today for follow up.  ? ?HPI: Feels well today.  No new complaints ? ?She is compliant with nifedipine to 30 mg daily, continue Coreg 12.5 mg twice daily, Vasotec 20 mg twice daily. She is no longer on digoxin. No cp, sob.  She has not had to use hydralazine ? ?She is established with cardiology, Dr. Charlestine Night.  He has ordered 24 ambulatory BP monitoring. ? ?HLD-compliant with Crestor 10 mg ? ? ?DM 1-she is following with Dr Kelton Pillar ? ?HISTORY:  ?Past Medical History:  ?Diagnosis Date  ? Diabetes mellitus   ? Hyperlipidemia   ? Hypertension   ? ?Past Surgical History:  ?Procedure Laterality Date  ? AMPUTATION TOE  04/10/2021  ? Procedure: AMPUTATION 2nd TOE, left foot;  Surgeon: Edrick Kins, DPM;  Location: [facility];  Service: Podiatry;;  ? CATARACT EXTRACTION    ? eye surgery r Right   ? lense placed  ? INCISION AND DRAINAGE Left 04/18/2021  ? Procedure: INCISION AND DRAINAGE;  Surgeon: [name], DPM;  Location: [facility] OR[facility];  Service: Podiatry;  Laterality: Left;  ? INCISION AND DRAINAGE OF WOUND Left 04/10/2021  ? Procedure: IRRIGATION AND DEBRIDEMENT WOUND;  Surgeon: Edrick Kins, DPM;  Location: [facility];  Service: Podiatry;  Laterality: Left;  ? PERIPHERAL VASCULAR BALLOON ANGIOPLASTY Left 04/13/2021  ? Procedure: PERIPHERAL VASCULAR BALLOON ANGIOPLASTY;  Surgeon: Katha Cabal, MD;  Location: [place] [facility];  Service: Cardiovascular;  Laterality: Left;  ? 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  ? Procedure: TRANSMETATARSAL AMPUTATION;  Surgeon: Felipa Furnace, DPM;  Location: [facility];  Service: Podiatry;  Laterality: Left;  ? ?Family History  ?Problem Relation Age of Onset  ? Diabetes Sister   ?     3/5 sisters have DM  ? Breast cancer Neg Hx   ? Colon cancer Neg Hx   ? ? ?Allergies: Patient has no known allergies. ?Current  Outpatient Medications on File Prior to Visit  ?Medication Sig Dispense Refill  ? aspirin 81 MG EC tablet Take 1 tablet (81 mg total) by mouth daily. Swallow whole. 30 tablet 12  ? carvedilol (COREG) 12.5 MG tablet Take 1 tablet (12.5 mg total) by mouth 2 (two) times daily with a meal. Take one tablet by mouth daily with a meal. 180 tablet 1  ? Continuous Blood Gluc Transmit (DEXCOM G6 TRANSMITTER) MISC Inject 1 Device into the skin as directed. Every 10 days 1 each 3  ? dapagliflozin propanediol (FARXIGA) 10 MG TABS tablet Take 10 mg by mouth daily.    ? enalapril (VASOTEC) 20 MG tablet Take 2 tablets (40 mg total) by mouth daily. 180 tablet 3  ? gentamicin ointment (GARAMYCIN) 0.1 % Apply 1 application topically daily. 30 g 2  ? hydrALAZINE (APRESOLINE) 10 MG tablet TAKE 1/2 TABLET BY MOUTH THREE TIMES DAILY AS NEEDED(IF BLOOD PRESSURE IS GREATER THAN 140/90) 30 tablet 1  ? insulin glargine (LANTUS) 100 UNIT/ML Solostar Pen Inject 36 Units into the skin daily. 30 mL 6  ? Insulin Pen Needle (EASY TOUCH PEN NEEDLES) 31G X 5 MM MISC Check sugar 3-4 daily 100 each 2  ? Insulin Pen Needle 31G X 5 MM MISC 1 Device by Does not apply route in the morning, at noon, in the evening, and at bedtime. [place]  each 2  ? Multiple Vitamin (MULTIVITAMIN) capsule Take 1 capsule by mouth daily.    ? NIFEdipine (PROCARDIA-XL/NIFEDICAL-XL) 30 MG 24 hr tablet Take 1 tablet (30 mg total) by mouth daily. 30 tablet 12  ? NOVOLOG FLEXPEN 100 UNIT/ML FlexPen Max daily 40 units 30 mL 6  ? rosuvastatin (CRESTOR) 10 MG tablet Take 1 tablet (10 mg total) by mouth daily. 90 tablet 3  ? Vitamin D, Cholecalciferol, 10 MCG (400 UNIT) CAPS Take 1 capsule by mouth daily.    ? ?No current facility-administered medications on file prior to visit.  ? ? ?Social History  ? ?Tobacco Use  ? Smoking status: Former  ?  Types: Cigarettes  ?  Quit date: 06/18/2005  ?  Years since quitting: 16.2  ? Smokeless tobacco: Never  ?Vaping Use  ? Vaping Use: Never used   ?Substance Use Topics  ? Alcohol use: No  ? Drug use: No  ? ? ?Review of Systems  ?Constitutional:  Negative for chills and fever.  ?Respiratory:  Negative for cough.   ?Cardiovascular:  Negative for chest pain and palpitations.  ?Gastrointestinal:  Negative for nausea and vomiting.  ?Skin:  Positive for wound. Rash: left foot. ?Neurological:  Negative for numbness.  ?   ?Objective:  ?  ?BP 132/78 (BP Location: Left Arm, Patient Position: Sitting, Cuff Size: Normal)   Pulse 66   Temp 98.2 ?F (36.8 ?C) (Oral)   Ht 5\' 5"  (1.651 m)   Wt 194 lb 12.8 oz (88.4 kg)   SpO2 95%   BMI 32.42 kg/m?  ?BP Readings from Last 3 Encounters:  ?08/27/21 132/78  ?07/20/21 122/80  ?07/16/21 (!) 190/90  ? ?Wt Readings from Last 3 Encounters:  ?08/27/21 194 lb 12.8 oz (88.4 kg)  ?07/20/21 188 lb 9.6 oz (85.5 kg)  ?07/16/21 189 lb (85.7 kg)  ? ? ?Physical Exam ?Vitals reviewed.  ?Constitutional:   ?   Appearance: She is well-developed.  ?Eyes:  ?   Conjunctiva/sclera: Conjunctivae normal.  ?Cardiovascular:  ?   Rate and Rhythm: Normal rate and regular rhythm.  ?   Pulses: Normal pulses.  ?   Heart sounds: Normal heart sounds.  ?Pulmonary:  ?   Effort: Pulmonary effort is normal.  ?   Breath sounds: Normal breath sounds. No wheezing, rhonchi or rales.  ?Skin: ?   General: Skin is warm and dry.  ?Neurological:  ?   Mental Status: She is alert.  ?Psychiatric:     ?   Speech: Speech normal.     ?   Behavior: Behavior normal.     ?   Thought Content: Thought content normal.  ? ? ?   ?Assessment & Plan:  ? ?Problem List Items Addressed This Visit   ? ?  ? Cardiovascular and Mediastinum  ? Hypertension  ?  Well-controlled, continue nifedipine to 30 mg daily, continue Coreg 12.5 mg twice daily, Vasotec 20 mg twice daily.  She is following with Dr. Charlestine Night, cardiology ?  ?  ? Relevant Orders  ? Basic metabolic panel  ? ? ? ?I am having Chelsea Barton maintain her multivitamin, aspirin, dapagliflozin propanediol, Vitamin D (Cholecalciferol),  rosuvastatin, NIFEdipine, enalapril, carvedilol, NovoLOG FlexPen, insulin glargine, Insulin Pen Needle, Dexcom G6 Transmitter, gentamicin ointment, Easy Touch Pen Needles, and hydrALAZINE. ? ? ?No orders of the defined types were placed in this encounter. ? ? ?Return precautions given.  ? ?Risks, benefits, and alternatives of the medications and treatment plan prescribed today were discussed,  and patient expressed understanding.  ? ?Education regarding symptom management and diagnosis given to patient on AVS. ? ?Continue to follow with Burnard Hawthorne, FNP for routine health maintenance.  ? ?Chelsea Barton and I agreed with plan.  ? ?Mable Paris, FNP ? ? ?

## 2021-08-27 NOTE — Assessment & Plan Note (Signed)
Excellent control.  Continue Crestor 10 mg ?

## 2021-09-05 ENCOUNTER — Ambulatory Visit: Payer: Medicaid Other | Admitting: Podiatry

## 2021-09-06 ENCOUNTER — Other Ambulatory Visit: Payer: Self-pay | Admitting: *Deleted

## 2021-09-06 DIAGNOSIS — Z87891 Personal history of nicotine dependence: Secondary | ICD-10-CM

## 2021-09-07 ENCOUNTER — Ambulatory Visit: Payer: Medicaid Other | Admitting: Cardiology

## 2021-09-07 ENCOUNTER — Other Ambulatory Visit: Payer: Self-pay

## 2021-09-07 ENCOUNTER — Encounter: Payer: Self-pay | Admitting: Cardiology

## 2021-09-07 VITALS — BP 148/88 | HR 65 | Ht 65.0 in | Wt 195.6 lb

## 2021-09-07 DIAGNOSIS — I10 Essential (primary) hypertension: Secondary | ICD-10-CM

## 2021-09-07 DIAGNOSIS — E7800 Pure hypercholesterolemia, unspecified: Secondary | ICD-10-CM | POA: Diagnosis not present

## 2021-09-07 DIAGNOSIS — I340 Nonrheumatic mitral (valve) insufficiency: Secondary | ICD-10-CM | POA: Diagnosis not present

## 2021-09-07 NOTE — Patient Instructions (Signed)

## 2021-09-07 NOTE — Progress Notes (Signed)
?Cardiology Office Note:   ? ?Date:  09/07/2021  ? ?IDPreeti Winegardner Barton, DOB Oct 31, 1960, MRN [id number] ? ?PCP:  Burnard Hawthorne, FNP ?  ?[facility] Providers ?Cardiologist:  Kate Sable, MD    ? ?Referring MD: Burnard Hawthorne, FNP  ? ?Chief Complaint  ?Patient presents with  ? 6 month f/u doing well  ? ? ?History of Present Illness:   ? ?Chelsea Barton is a 61 y.o. female with a hx of hypertension, hyperlipidemia, mitral regurgitation, diabetes, PAD s/p left transmetatarsal amputation, CKD, former smoker x30 years who presents for follow-up.  Previously seen due to history of mitral valve disease. ? ?Patient is being seen for mitral regurgitation and hypertension.  Echocardiogram was ordered to evaluate any changes in mitral valve pathology.  Previous echo showed mild to moderate MR.  Blood pressures adequately controlled on current medications.  Systolic usually 702 in the 120s at home. ? ? ?Prior notes ?Patient previously seen at [facility] clinic cardiology from a cardiac perspective.  Her insurance is no longer accepted at prior cardiac practice.   ?She had an echocardiogram in 2014 showing EF 45%, moderate mitral valve regurgitation. ? ? ?Past Medical History:  ?Diagnosis Date  ? Diabetes mellitus   ? Hyperlipidemia   ? Hypertension   ? ? ?Past Surgical History:  ?Procedure Laterality Date  ? AMPUTATION TOE  04/10/2021  ? Procedure: AMPUTATION 2nd TOE, left foot;  Surgeon: Edrick Kins, DPM;  Location: [facility];  Service: Podiatry;;  ? CATARACT EXTRACTION    ? eye surgery r Right   ? lense placed  ? INCISION AND DRAINAGE Left 04/18/2021  ? Procedure: INCISION AND DRAINAGE;  Surgeon: [name], DPM;  Location: [facility];  Service: Podiatry;  Laterality: Left;  ? INCISION AND DRAINAGE OF WOUND Left 04/10/2021  ? Procedure: IRRIGATION AND DEBRIDEMENT WOUND;  Surgeon: Edrick Kins, DPM;  Location: [facility];  Service: Podiatry;  Laterality: Left;  ? PERIPHERAL VASCULAR BALLOON ANGIOPLASTY Left  04/13/2021  ? Procedure: PERIPHERAL VASCULAR BALLOON ANGIOPLASTY;  Surgeon: Katha Cabal, MD;  Location: [place] [facility];  Service: Cardiovascular;  Laterality: Left;  ? 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  ? Procedure: TRANSMETATARSAL AMPUTATION;  Surgeon: Felipa Furnace, DPM;  Location: [facility];  Service: Podiatry;  Laterality: Left;  ? ? ?Current Medications: ?Current Meds  ?Medication Sig  ? aspirin 81 MG EC tablet Take 1 tablet (81 mg total) by mouth daily. Swallow whole.  ? carvedilol (COREG) 12.5 MG tablet Take 1 tablet (12.5 mg total) by mouth 2 (two) times daily with a meal. Take one tablet by mouth daily with a meal.  ? Continuous Blood Gluc Sensor (DEXCOM G6 SENSOR) MISC Inject 1 Device into the skin as directed. Every  ? Continuous Blood Gluc Transmit (DEXCOM G6 TRANSMITTER) MISC Inject 1 Device into the skin as directed. Every 10 days  ? dapagliflozin propanediol (FARXIGA) 10 MG TABS tablet Take 10 mg by mouth daily.  ? enalapril (VASOTEC) 20 MG tablet Take 2 tablets (40 mg total) by mouth daily.  ? gentamicin ointment (GARAMYCIN) 0.1 % Apply 1 application topically daily.  ? glucose blood (ACCU-CHEK AVIVA PLUS) test strip Used to check blood sugar 4x daily.  ? hydrALAZINE (APRESOLINE) 10 MG tablet TAKE 1/2 TABLET BY MOUTH THREE TIMES DAILY AS NEEDED(IF BLOOD PRESSURE IS GREATER THAN 140/90)  ? insulin glargine (LANTUS) 100 UNIT/ML Solostar Pen Inject 36 Units into the skin daily.  ? Insulin Pen Needle (  EASY TOUCH PEN NEEDLES) 31G X 5 MM MISC Check sugar 3-4 daily  ? Insulin Pen Needle 31G X 5 MM MISC 1 Device by Does not apply route in the morning, at noon, in the evening, and at bedtime.  ? Multiple Vitamin (MULTIVITAMIN) capsule Take 1 capsule by mouth daily.  ? NIFEdipine (PROCARDIA-XL/NIFEDICAL-XL) 30 MG 24 hr tablet Take 1 tablet (30 mg total) by mouth daily.  ? NOVOLOG FLEXPEN 100 UNIT/ML FlexPen Max daily 40 units  ? rosuvastatin (CRESTOR) 10 MG tablet Take 1 tablet (10 mg  total) by mouth daily.  ? Vitamin D, Cholecalciferol, 10 MCG (400 UNIT) CAPS Take 1 capsule by mouth daily.  ?  ? ?Allergies:   Patient has no known allergies.  ? ?Social History  ? ?Socioeconomic History  ? Marital status: Single  ?  Spouse name: Not on file  ? Number of children: Not on file  ? Years of education: Not on file  ? Highest education level: Not on file  ?Occupational History  ? Not on file  ?Tobacco Use  ? Smoking status: Former  ?  Types: Cigarettes  ?  Quit date: 06/18/2005  ?  Years since quitting: 16.2  ? Smokeless tobacco: Never  ?Vaping Use  ? Vaping Use: Never used  ?Substance and Sexual Activity  ? Alcohol use: No  ? Drug use: No  ? Sexual activity: Not on file  ?Other Topics Concern  ? Not on file  ?Social History Narrative  ? Has a great niece that she keeps a lot, 41 yo, '[name]'.  Stays weekends with her.   ?   ? Has 8 great nieces, 3 nieces, 6 nephews  ? ?Social Determinants of Health  ? ?Financial Resource Strain: Medium Risk  ? Difficulty of Paying Living Expenses: Somewhat hard  ?Food Insecurity: Food Insecurity Present  ? Worried About Charity fundraiser in the Last Year: Sometimes true  ? Ran Out of Food in the Last Year: Sometimes true  ?Transportation Needs: No Transportation Needs  ? Lack of Transportation (Medical): No  ? Lack of Transportation (Non-Medical): No  ?Physical Activity: Sufficiently Active  ? Days of Exercise per Week: 7 days  ? Minutes of Exercise per Session: 30 min  ?Stress: Not on file  ?Social Connections: Not on file  ?  ? ?Family History: ?The patient's family history includes Diabetes in her sister. There is no history of Breast cancer or Colon cancer. ? ?ROS:   ?Please see the history of present illness.    ? All other systems reviewed and are negative. ? ?EKGs/Labs/Other Studies Reviewed:   ? ?The following studies were reviewed today: ? ? ?EKG:  EKG is  ordered today.  The ekg ordered today demonstrates normal sinus rhythm, occasional PVCs ? ?Recent  Labs: ?05/08/2021: ALT 14 ?06/14/2021: Hemoglobin 12.1; Platelets 250 ?07/11/2021: TSH 1.46 ?08/27/2021: BUN 37; Creatinine, Ser 2.30; Potassium 4.9; Sodium 140  ?Recent Lipid Panel ?   ?Component Value Date/Time  ? CHOL 84 04/12/2021 0429  ? TRIG 87 04/12/2021 0429  ? HDL 25 (L) 04/12/2021 0429  ? CHOLHDL 3.4 04/12/2021 0429  ? VLDL 17 04/12/2021 0429  ? [place] 42 04/12/2021 0429  ? LDLDIRECT 84.9 09/10/2012 1628  ? ? ? ?Risk Assessment/Calculations:   ? ? ?    ? ?Physical Exam:   ? ?VS:  BP (!) 148/88   Pulse 65   Ht 5\' 5"  (1.651 m)   Wt 195 lb 9.6 oz (88.7 kg)  BMI 32.55 kg/m?    ? ?Wt Readings from Last 3 Encounters:  ?09/07/21 195 lb 9.6 oz (88.7 kg)  ?08/27/21 194 lb 12.8 oz (88.4 kg)  ?07/20/21 188 lb 9.6 oz (85.5 kg)  ?  ? ?GEN:  Well nourished, well developed in no acute distress ?HEENT: Normal ?NECK: No JVD; No carotid bruits ?LYMPHATICS: No lymphadenopathy ?CARDIAC: RRR, no murmurs, rubs, gallops ?RESPIRATORY:  Clear to auscultation without rales, wheezing or rhonchi  ?ABDOMEN: Soft, non-tender, non-distended ?MUSCULOSKELETAL:  No edema; left metatarsal amputation noted ?SKIN: Warm and dry ?NEUROLOGIC:  Alert and oriented x 3 ?PSYCHIATRIC:  Normal affect  ? ?ASSESSMENT:   ? ?1. Mitral valve insufficiency, unspecified etiology   ?2. Primary hypertension   ?3. Pure hypercholesterolemia   ? ? ?PLAN:   ? ?In order of problems listed above: ? ?Mitral regurgitation, repeat echo 07/2021 EF 50 to 55%, mild to moderate MR.  Stable from prior.  Plan repeat echo in 1 to 2 years. ?Hypertension, BP elevated today, controlled at home.  She just took 8 and BP meds.  Continue current meds including Coreg 12.5 twice daily, enalapril 40 daily, nifedipine 30 mg daily. ?Hyperlipidemia, cholesterol controlled, continue Crestor 20 mg daily. ? ?Follow-up 1 year ? ?   ? ?Medication Adjustments/Labs and Tests Ordered: ?Current medicines are reviewed at length with the patient today.  Concerns regarding medicines are outlined  above.  ?Orders Placed This Encounter  ?Procedures  ? EKG 12-Lead  ? ?No orders of the defined types were placed in this encounter. ? ? ?Patient Instructions  ?Medication Instructions:  ?Your physician recommends th

## 2021-09-11 ENCOUNTER — Other Ambulatory Visit: Payer: Self-pay | Admitting: *Deleted

## 2021-09-11 DIAGNOSIS — R531 Weakness: Secondary | ICD-10-CM | POA: Diagnosis not present

## 2021-09-11 NOTE — Patient Outreach (Signed)
Care Coordination ? ?09/11/2021 ? ?[name] ?[birth year] ?[id number] ? ? ?Medicaid Managed Care  ? ?Unsuccessful Outreach Note ? ?09/11/2021 ?Name: Chelsea Barton MRN: [id number] DOB: 07-Dec-1960 ? ?Referred by: Burnard Hawthorne, FNP ?Reason for referral : High Risk Managed Medicaid (Unsuccessful RNCM follow up telephone outreach) ? ? ?An unsuccessful telephone outreach was attempted today. The patient was referred to the case management team for assistance with care management and care coordination.  ? ?Follow Up Plan: The care management team will reach out to the patient again over the next 14 days.  ? ?[name] RN, BSN ?[place] ?RN Care Coordinator ? ? ?

## 2021-09-11 NOTE — Patient Instructions (Signed)
Visit Information ? ?[name]  - as a part of your Medicaid benefit, you are eligible for care management and care coordination services at no cost or copay. I was unable to reach you by phone today but would be happy to help you with your health related needs. Please feel free to call me @ [phone].  ? ?A member of the Managed Medicaid care management team will reach out to you again over the next 14 days.  ? ?[name] RN, BSN ?[place] ?RN Care Coordinator ?  ?

## 2021-09-12 ENCOUNTER — Other Ambulatory Visit: Payer: Self-pay

## 2021-09-12 ENCOUNTER — Ambulatory Visit: Payer: Medicaid Other | Admitting: Podiatry

## 2021-09-12 DIAGNOSIS — L97422 Non-pressure chronic ulcer of left heel and midfoot with fat layer exposed: Secondary | ICD-10-CM

## 2021-09-12 DIAGNOSIS — E13621 Other specified diabetes mellitus with foot ulcer: Secondary | ICD-10-CM

## 2021-09-14 ENCOUNTER — Encounter: Payer: Self-pay | Admitting: Podiatry

## 2021-09-14 NOTE — Progress Notes (Signed)
?  Subjective:  ?Patient ID: Chelsea Barton, female    DOB: [birth year],  MRN: [id number] ? ?No chief complaint on file. ? ? ?61 y.o. female presents with the above complaint. History confirmed with patient.  Denies fevers chills nausea vomiting ? ?Objective:  ?Physical Exam: ?Skin flaps warm and well perfused.  Open ulceration with fiber granular wound bed measuring 3.5 x 2.2 x 0.4 cm left foot, fibrogranular wound bed, no gangrene signs of infection purulence or malodor is noted ? ? ? ? ? ? ? ? ? ? ? ?Assessment:  ? ?1. 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 ? ? ? ?Plan:  ?Patient was evaluated and treated and all questions answered. ? ?Ulcer left foot ?-We discussed the etiology and factors that are a part of the wound healing process.  We also discussed the risk of infection both soft tissue and osteomyelitis from open ulceration.  Discussed the risk of limb loss if this happens or worsens. ?-Debridement as below. ?-Dressed with Prisma and DSD.  Continue this at home.  Dressing supplies given today ?-Continue surgical shoe offloading ?-So far responding well to Prisma ? ?Procedure: Excisional Debridement of Wound ?Rationale: Removal of non-viable soft tissue from the wound to promote healing.  ?Anesthesia: none ?Post-Debridement Wound Measurements: 3.5 x 2.2 x 0.4 cm ?Type of Debridement: Sharp Excisional ?Tissue Removed: Non-viable soft tissue ?Depth of Debridement: To level of bone ?Technique: Sharp excisional debridement to bleeding, viable wound base.  ?Dressing: Dry, sterile, compression dressing. ?Disposition: Patient tolerated procedure well. Patient to return in 2 week for follow-up. ? ? ? ? ?Return in about 2 weeks (around 09/26/2021) for wound care.  ? ? ?

## 2021-09-18 ENCOUNTER — Other Ambulatory Visit: Payer: Self-pay

## 2021-09-18 ENCOUNTER — Ambulatory Visit
Admission: RE | Admit: 2021-09-18 | Discharge: 2021-09-18 | Disposition: A | Discharge status: Home / Self Care | Payer: Medicaid Other | Source: Ambulatory Visit | Attending: Family | Admitting: Family

## 2021-09-18 ENCOUNTER — Other Ambulatory Visit (INDEPENDENT_AMBULATORY_CARE_PROVIDER_SITE_OTHER): Payer: Self-pay | Admitting: Nurse Practitioner

## 2021-09-18 DIAGNOSIS — Z87891 Personal history of nicotine dependence: Secondary | ICD-10-CM | POA: Diagnosis present

## 2021-09-18 DIAGNOSIS — Z122 Encounter for screening for malignant neoplasm of respiratory organs: Secondary | ICD-10-CM | POA: Diagnosis not present

## 2021-09-18 DIAGNOSIS — I739 Peripheral vascular disease, unspecified: Secondary | ICD-10-CM

## 2021-09-19 ENCOUNTER — Ambulatory Visit (INDEPENDENT_AMBULATORY_CARE_PROVIDER_SITE_OTHER): Payer: Medicaid Other

## 2021-09-19 ENCOUNTER — Encounter (INDEPENDENT_AMBULATORY_CARE_PROVIDER_SITE_OTHER): Payer: Self-pay | Admitting: Nurse Practitioner

## 2021-09-19 ENCOUNTER — Ambulatory Visit (INDEPENDENT_AMBULATORY_CARE_PROVIDER_SITE_OTHER): Payer: Medicaid Other | Admitting: Nurse Practitioner

## 2021-09-19 VITALS — BP 184/80 | HR 69 | Resp 16 | Wt 194.0 lb

## 2021-09-19 DIAGNOSIS — I739 Peripheral vascular disease, unspecified: Secondary | ICD-10-CM

## 2021-09-19 DIAGNOSIS — E785 Hyperlipidemia, unspecified: Secondary | ICD-10-CM | POA: Diagnosis not present

## 2021-09-19 DIAGNOSIS — Z9889 Other specified postprocedural states: Secondary | ICD-10-CM

## 2021-09-19 DIAGNOSIS — I10 Essential (primary) hypertension: Secondary | ICD-10-CM

## 2021-09-20 ENCOUNTER — Other Ambulatory Visit: Payer: Self-pay | Admitting: *Deleted

## 2021-09-20 NOTE — Patient Outreach (Signed)
Care Coordination ? ?09/20/2021 ? ?[name] ?Jun 22, 1960 ?[id number] ? ? ?Medicaid Managed Care  ? ?Unsuccessful Outreach Note ? ?09/20/2021 ?Name: Chelsea Barton MRN: [id number] DOB: [birth year] ? ?Referred by: Burnard Hawthorne, FNP ?Reason for referral : High Risk Managed Medicaid (Unsuccessful RNCM follow up telephone outreach, 2nd attempt) ? ? ?A second unsuccessful telephone outreach was attempted today. The patient was referred to the case management team for assistance with care management and care coordination.  ? ?Follow Up Plan: A HIPAA compliant phone message was left for the patient providing contact information and requesting a return call.  ? ?[name] RN, BSN ?[place] ?RN Care Coordinator ? ? ?

## 2021-09-20 NOTE — Patient Instructions (Signed)
Visit Information ? ?[name]  - as a part of your Medicaid benefit, you are eligible for care management and care coordination services at no cost or copay. I was unable to reach you by phone today but would be happy to help you with your health related needs. Please feel free to call me @ [phone].  ? ?A member of the Managed Medicaid care management team will reach out to you again over the next 14 days.  ? ?[name] RN, BSN ?[place] ?RN Care Coordinator ?  ?

## 2021-09-25 ENCOUNTER — Telehealth: Payer: Self-pay | Admitting: Family

## 2021-09-25 NOTE — Telephone Encounter (Signed)
.. ?  Medicaid Managed Care  ? ?Unsuccessful Outreach Note ? ?09/25/2021 ?Name: Chelsea Barton MRN: [id number] DOB: [birth year] ? ?Referred by: Burnard Hawthorne, FNP ?Reason for referral : High Risk Managed Medicaid (I called the patient today to get her rescheduled with the MM RNCM. Someone would pick up the phone but then would disconnect the call. ) ? ? ?Third unsuccessful telephone outreach was attempted today. The patient was referred to the case management team for assistance with care management and care coordination. The patient's primary care provider has been notified of our unsuccessful attempts to make or maintain contact with the patient. The care management team is pleased to engage with this patient at any time in the future should he/she be interested in assistance from the care management team.  ? ?Follow Up Plan: We have been unable to make contact with the patient for follow up. The care management team is available to follow up with the patient after provider conversation with the patient regarding recommendation for care management engagement and subsequent re-referral to the care management team.  ? ? ?Reita Chard ?Care Guide, High Risk Medicaid Managed Care ?Embedded Care Coordination ?[place]  ? ? ?SIGNATURE  ?

## 2021-09-26 ENCOUNTER — Other Ambulatory Visit: Payer: Self-pay | Admitting: *Deleted

## 2021-09-26 ENCOUNTER — Ambulatory Visit: Payer: Medicaid Other | Admitting: Podiatry

## 2021-09-26 ENCOUNTER — Telehealth: Payer: Self-pay | Admitting: Family

## 2021-09-26 ENCOUNTER — Encounter: Payer: Self-pay | Admitting: Podiatry

## 2021-09-26 DIAGNOSIS — L97422 Non-pressure chronic ulcer of left heel and midfoot with fat layer exposed: Secondary | ICD-10-CM | POA: Diagnosis not present

## 2021-09-26 DIAGNOSIS — E13621 Other specified diabetes mellitus with foot ulcer: Secondary | ICD-10-CM

## 2021-09-26 MED ORDER — GENTAMICIN SULFATE 0.1 % EX OINT: 1.0000 "application " | TOPICAL_OINTMENT | Freq: Every day | CUTANEOUS | 2 refills | Status: AC

## 2021-09-26 NOTE — Progress Notes (Signed)
?  Subjective:  ?Patient ID: Chelsea Barton, female    DOB: [birth year],  MRN: [id number] ? ?Chief Complaint  ?Patient presents with  ? Wound Check  ?  "Its a little better"  ? ? ?61 y.o. female presents with the above complaint. History confirmed with patient.  Denies fevers chills nausea vomiting ? ?Objective:  ?Physical Exam: ?Skin flaps warm and well perfused.  Open ulceration with fiber granular wound bed measuring 3.5 x 1.5 x 0.4 cm left foot, fibrogranular wound bed, no gangrene signs of infection purulence or malodor is noted ? ? ? ? ? ? ? ? ? ? ? ? ?Assessment:  ? ?1. 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 ? ? ? ?Plan:  ?Patient was evaluated and treated and all questions answered. ? ?Ulcer left foot ?-We discussed the etiology and factors that are a part of the wound healing process.  We also discussed the risk of infection both soft tissue and osteomyelitis from open ulceration.  Discussed the risk of limb loss if this happens or worsens. ?-Debridement as below. ?-Dressed with Iodosorb and DSD.  Gentamicin ointment and dressings at home.  I will see her back ? ?Procedure: Excisional Debridement of Wound ?Rationale: Removal of non-viable soft tissue from the wound to promote healing.  ?Anesthesia: none ?Post-Debridement Wound Measurements: 3.5 x 1.5 x 0.4 cm ?Type of Debridement: Sharp Excisional ?Tissue Removed: Non-viable soft tissue ?Depth of Debridement: To level of bone ?Technique: Sharp excisional debridement to bleeding, viable wound base.  ?Dressing: Dry, sterile, compression dressing. ?Disposition: Patient tolerated procedure well. Patient to return in 2 week for follow-up. ? ? ? ? ?Return in about 3 weeks (around 10/17/2021) for wound care.  ? ? ?

## 2021-09-26 NOTE — Telephone Encounter (Signed)
Call pt  ? ?I received results from your annual CT lung scan from [place] which showed benign findings of lungs. You will need do this again next year so please ensure you are contacted directed from [facility] to schedule.  ? ?Please call our office if you are not contacted for another test.  ? ?It was noted again on the exam that you have coronary artery atherosclerosis, or often referred to as hardening of the arteries. This can put you at risk for stroke, heart attack.  ? ?You have had a recent echocardiogram 07/2021 by cardiology, Dr. Charlestine Night.  It is extremely important that you maintain follow up with Dr. Charlestine Night annually and continue Crestor 20 mg ? ? ?

## 2021-09-26 NOTE — Patient Outreach (Signed)
Care Coordination ? ?09/26/2021 ? ?[name] ?April 27, 1961 ?[id number] ? ? ?Medicaid Managed Care  ? ?Unsuccessful Outreach Note ? ?09/26/2021 ?Name: Chelsea Barton MRN: [id number] DOB: 1960-12-09 ? ?Referred by: Burnard Hawthorne, FNP ?Reason for referral : Case Closure (RNCM performing Case Closure for 3 unsuccessful outreach attempts) ? ? ?Three unsuccessful telephone outreach attempts have been made. The patient was referred to the case management team for assistance with care management and care coordination. The patient's primary care provider has been notified of our unsuccessful attempts to make or maintain contact with the patient. The care management team is pleased to engage with this patient at any time in the future should he/she be interested in assistance from the care management team.  ? ?Follow Up Plan: We have been unable to make contact with the patient for follow up. The care management team is available to follow up with the patient after provider conversation with the patient regarding recommendation for care management engagement and subsequent re-referral to the care management team.  ? ?[name] RN, BSN ?[place] ?RN Care Coordinator ? ? ?

## 2021-09-28 NOTE — Telephone Encounter (Signed)
Spoke to patient about results and she verbalized understanding ?

## 2021-09-30 ENCOUNTER — Encounter (INDEPENDENT_AMBULATORY_CARE_PROVIDER_SITE_OTHER): Payer: Self-pay | Admitting: Nurse Practitioner

## 2021-09-30 NOTE — Progress Notes (Signed)
? ?Subjective:  ? ? Patient ID: Chelsea Barton, female    DOB: [birth year], 61 y.o.   MRN: [id number] ?Chief Complaint  ?Patient presents with  ? Follow-up  ?  Ultrasound follow up  ? ? ?The patient returns to the office for followup and review of the noninvasive studies.  ? ?There have been no interval changes in lower extremity symptoms. No interval shortening of the patient's claudication distance or development of rest pain symptoms. No new ulcers or wounds have occurred since the last visit.  The patient's left transmetatarsal amputation is also healing well. ? ?There have been no significant changes to the patient's overall health care. ? ?The patient denies amaurosis fugax or recent TIA symptoms. There are no documented recent neurological changes noted. ?There is no history of DVT, PE or superficial thrombophlebitis. ?The patient denies recent episodes of angina or shortness of breath.  ? ?ABI Rt=0.93 and Lt=1.10  (previous ABI's Rt=0.97 and Lt=0.99) ?Duplex ultrasound of the right lower extremity showed triphasic tibial artery waveforms and normal toe waveforms.  The left has triphasic/monophasic waveforms. ? ? ?Review of Systems ? ?   ?Objective:  ? Physical Exam ?Vitals reviewed.  ?HENT:  ?   Head: Normocephalic.  ?Cardiovascular:  ?   Rate and Rhythm: Normal rate.  ?   Pulses:     ?     Dorsalis pedis pulses are detected w/ Doppler on the right side and detected w/ Doppler on the left side.  ?     Posterior tibial pulses are detected w/ Doppler on the right side and detected w/ Doppler on the left side.  ?Pulmonary:  ?   Effort: Pulmonary effort is normal.  ?Musculoskeletal:  ?   Left Lower Extremity: Left leg is amputated below ankle.  ?Neurological:  ?   Mental Status: She is alert and oriented to person, place, and time.  ?Psychiatric:     ?   Mood and Affect: Mood normal.     ?   Behavior: Behavior normal.     ?   Thought Content: Thought content normal.     ?   Judgment: Judgment normal.  ? ? ?BP (!)  184/80 (BP Location: Left Arm)   Pulse 69   Resp 16   Wt 194 lb (88 kg)   BMI 32.28 kg/m?  ? ?Past Medical History:  ?Diagnosis Date  ? Diabetes mellitus   ? Hyperlipidemia   ? Hypertension   ? ? ?Social History  ? ?Socioeconomic History  ? Marital status: Single  ?  Spouse name: Not on file  ? Number of children: Not on file  ? Years of education: Not on file  ? Highest education level: Not on file  ?Occupational History  ? Not on file  ?Tobacco Use  ? Smoking status: Former  ?  Types: Cigarettes  ?  Quit date: 06/18/2005  ?  Years since quitting: 16.2  ? Smokeless tobacco: Never  ?Vaping Use  ? Vaping Use: Never used  ?Substance and Sexual Activity  ? Alcohol use: No  ? Drug use: No  ? Sexual activity: Not on file  ?Other Topics Concern  ? Not on file  ?Social History Narrative  ? Has a great niece that she keeps a lot, 62 yo, '[name]'.  Stays weekends with her.   ?   ? Has 8 great nieces, 3 nieces, 6 nephews  ? ?Social Determinants of Health  ? ?Financial Resource Strain: Medium Risk  ? Difficulty  of Paying Living Expenses: Somewhat hard  ?Food Insecurity: Food Insecurity Present  ? Worried About Charity fundraiser in the Last Year: Sometimes true  ? Ran Out of Food in the Last Year: Sometimes true  ?Transportation Needs: No Transportation Needs  ? Lack of Transportation (Medical): No  ? Lack of Transportation (Non-Medical): No  ?Physical Activity: Sufficiently Active  ? Days of Exercise per Week: 7 days  ? Minutes of Exercise per Session: 30 min  ?Stress: Not on file  ?Social Connections: Not on file  ?Intimate Partner Violence: Not on file  ? ? ?Past Surgical History:  ?Procedure Laterality Date  ? AMPUTATION TOE  04/10/2021  ? Procedure: AMPUTATION 2nd TOE, left foot;  Surgeon: Edrick Kins, DPM;  Location: [facility];  Service: Podiatry;;  ? CATARACT EXTRACTION    ? eye surgery r Right   ? lense placed  ? INCISION AND DRAINAGE Left 04/18/2021  ? Procedure: INCISION AND DRAINAGE;  Surgeon: [name],  DPM;  Location: [facility];  Service: Podiatry;  Laterality: Left;  ? INCISION AND DRAINAGE OF WOUND Left 04/10/2021  ? Procedure: IRRIGATION AND DEBRIDEMENT WOUND;  Surgeon: Edrick Kins, DPM;  Location: [facility];  Service: Podiatry;  Laterality: Left;  ? PERIPHERAL VASCULAR BALLOON ANGIOPLASTY Left 04/13/2021  ? Procedure: PERIPHERAL VASCULAR BALLOON ANGIOPLASTY;  Surgeon: Katha Cabal, MD;  Location: [facility];  Service: Cardiovascular;  Laterality: Left;  ? 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  ? Procedure: TRANSMETATARSAL AMPUTATION;  Surgeon: Felipa Furnace, DPM;  Location: [facility];  Service: Podiatry;  Laterality: Left;  ? ? ?Family History  ?Problem Relation Age of Onset  ? Diabetes Sister   ?     3/5 sisters have DM  ? Breast cancer Neg Hx   ? Colon cancer Neg Hx   ? ? ?No Known Allergies ? ? ?  Latest Ref Rng & Units 06/14/2021  ?  1:07 PM 05/08/2021  ? 10:21 AM 04/24/2021  ?  6:54 AM  ?CBC  ?WBC 3.4 - 10.8 x10E3/uL 9.2   7.7   8.5    ?Hemoglobin 11.1 - 15.9 g/dL 12.1   11.6   9.7    ?Hematocrit 34.0 - 46.6 % 39.0   37.7   32.5    ?Platelets 150 - 450 x10E3/uL 250   265.0   341    ? ? ? ? ?CMP  ?   ?Component Value Date/Time  ? NA 140 08/27/2021 0953  ? NA 137 01/20/2014 0000  ? K 4.9 08/27/2021 0953  ? CL 103 08/27/2021 0953  ? CO2 31 08/27/2021 0953  ? GLUCOSE 63 (L) 08/27/2021 0953  ? BUN 37 (H) 08/27/2021 0953  ? BUN 25 (A) 01/20/2014 0000  ? CREATININE 2.30 (H) 08/27/2021 0953  ? CALCIUM 9.4 08/27/2021 0953  ? PROT 7.2 05/08/2021 1021  ? ALBUMIN 3.8 05/08/2021 1021  ? AST 16 05/08/2021 1021  ? ALT 14 05/08/2021 1021  ? ALKPHOS 84 05/08/2021 1021  ? BILITOT 0.4 05/08/2021 1021  ? GFRNONAA 32 (L) 04/24/2021 0654  ? GFRAA 53 (L) 11/14/2017 1349  ? ? ? ?VAS [name] ABI WITH/WO TBI ? ?Result Date: 09/19/2021 ? LOWER EXTREMITY DOPPLER STUDY Patient Name:  Chelsea Barton  Date of Exam:   09/19/2021 Medical Rec #: [id number]        Accession #:    [id number] Date of Birth: 05-19-1961          Patient Gender: F  Patient Age:   11 years Exam Location:  [facility] Procedure:      VAS [name] ABI WITH/WO TBI Referring Phys: [name] --------------------------------------------------------------------------------  Indications: Peripheral artery disease.  Vascular Interventions: 04/13/21: Left SFA atherectomy/PTA/ stent & left                         transmetatarsal amputation;. Comparison Study: 07/03/2021 Performing Technologist: Almira Coaster RVS  Examination Guidelines: A complete evaluation includes at minimum, Doppler waveform signals and systolic blood pressure reading at the level of bilateral brachial, anterior tibial, and posterior tibial arteries, when vessel segments are accessible. Bilateral testing is considered an integral part of a complete examination. Photoelectric Plethysmograph (PPG) waveforms and toe systolic pressure readings are included as required and additional duplex testing as needed. Limited examinations for reoccurring indications may be performed as noted.  ABI Findings: +---------+------------------+-----+---------+--------+ Right    Rt Pressure (mmHg)IndexWaveform Comment  +---------+------------------+-----+---------+--------+ Brachial 188                                      +---------+------------------+-----+---------+--------+ ATA      174               0.93 triphasic         +---------+------------------+-----+---------+--------+ PTA      171               0.91 triphasic         +---------+------------------+-----+---------+--------+ Great Toe164               0.87 Normal            +---------+------------------+-----+---------+--------+ +---------+------------------+-----+----------+------------------+ Left     Lt Pressure (mmHg)IndexWaveform  Comment            +---------+------------------+-----+----------+------------------+ Brachial 181                                                  +---------+------------------+-----+----------+------------------+ ATA      188               1.00 triphasic                    +---------+------------------+-----+----------+------------------+ PTA      207               1.10 monophasic                   +---------+----------

## 2021-10-01 ENCOUNTER — Ambulatory Visit (INDEPENDENT_AMBULATORY_CARE_PROVIDER_SITE_OTHER): Payer: Medicaid Other | Admitting: Nurse Practitioner

## 2021-10-01 ENCOUNTER — Encounter (INDEPENDENT_AMBULATORY_CARE_PROVIDER_SITE_OTHER): Payer: Medicaid Other

## 2021-10-08 ENCOUNTER — Telehealth: Payer: Self-pay | Admitting: Family

## 2021-10-08 NOTE — Telephone Encounter (Signed)
noted 

## 2021-10-08 NOTE — Telephone Encounter (Signed)
-----   Message from Kate Sable, MD sent at 09/28/2021  8:05 AM EDT ----- ?Good morning, as per my last note, she is supposed to follow-up yearly with [name].  Continue current medications.  Thank you ?----- Message ----- ?From: Burnard Hawthorne, FNP ?Sent: 09/26/2021   1:10 PM EDT ?To: Kate Sable, MD ? ?Dr Charlestine Night, ? ?I hope you are doing well.  ? ? I know you recently saw our mutual patient a couple of weeks ago. Echocardiogram done 07/2021.  ? ?  I just wanted to ensure no further management as it relates to coronary artery disease as CT chest appears quite significant and wanted to ensure I shared with you. In careeverywhere, appears she had a stress test 08/16/21 however I cannot see results for this and I suspect this would have been arranged by Dr Nehemiah Massed.  ? ? She is compliant with Lipitor and LDL < 50.  ? ?I will certainly advise her to continue follow up with you annually unless you feel she needs to see you sooner.  ? ?[name] ? ? ?----- Message ----- ?From: Dimple Casey, RT ?Sent: 09/18/2021   8:37 AM EDT ?To: Burnard Hawthorne, FNP ? ? ? ?

## 2021-10-12 DIAGNOSIS — R531 Weakness: Secondary | ICD-10-CM | POA: Diagnosis not present

## 2021-10-15 ENCOUNTER — Encounter: Payer: Self-pay | Admitting: Podiatry

## 2021-10-15 ENCOUNTER — Ambulatory Visit: Payer: Medicaid Other | Admitting: Podiatry

## 2021-10-15 DIAGNOSIS — B351 Tinea unguium: Secondary | ICD-10-CM | POA: Diagnosis not present

## 2021-10-15 DIAGNOSIS — E13621 Other specified diabetes mellitus with foot ulcer: Secondary | ICD-10-CM | POA: Diagnosis not present

## 2021-10-15 DIAGNOSIS — I739 Peripheral vascular disease, unspecified: Secondary | ICD-10-CM

## 2021-10-15 DIAGNOSIS — M79674 Pain in right toe(s): Secondary | ICD-10-CM | POA: Diagnosis not present

## 2021-10-15 DIAGNOSIS — L97422 Non-pressure chronic ulcer of left heel and midfoot with fat layer exposed: Secondary | ICD-10-CM | POA: Diagnosis not present

## 2021-10-15 NOTE — Progress Notes (Signed)
?  Subjective:  ?Patient ID: Chelsea Barton, female    DOB: 1960-12-02,  MRN: [id number] ? ?Chief Complaint  ?Patient presents with  ? Diabetic Ulcer  ?  Follow up left foot ulcer  ? ? ?60 y.o. female presents with the above complaint. History confirmed with patient.  Denies fevers chills nausea vomiting.  Nails are thickened elongated and causing pain again. ? ?Objective:  ?Physical Exam: ?Skin flaps warm and well perfused.  Open ulceration with fiber granular wound bed measuring 2.5x1.2 x 0.4 cm left foot, fibrogranular wound bed, no gangrene signs of infection purulence or malodor is noted ? ? ? ? ? ? ? ? ? ? ? ? ?Assessment:  ? ?No diagnosis found. ? ? ? ? ?Plan:  ?Patient was evaluated and treated and all questions answered. ? ?Ulcer left foot ?-We discussed the etiology and factors that are a part of the wound healing process.  We also discussed the risk of infection both soft tissue and osteomyelitis from open ulceration.  Discussed the risk of limb loss if this happens or worsens. ?-Debridement as below. ?-Dressed with Prisma and DSD.  Continue Prisma, gentamicin ointment and dressings at home.  I will see her back ? ?Procedure: Excisional Debridement of Wound ?Rationale: Removal of non-viable soft tissue from the wound to promote healing.  ?Anesthesia: none ?Post-Debridement Wound Measurements: 2.5x1.2 x 0.4 cm ?Type of Debridement: Sharp Excisional ?Tissue Removed: Non-viable soft tissue ?Depth of Debridement: To level of bone ?Technique: Sharp excisional debridement to bleeding, viable wound base.  ?Dressing: Dry, sterile, compression dressing. ?Disposition: Patient tolerated procedure well. Patient to return in 2 week for follow-up. ? ? ?Discussed the etiology and treatment options for the condition in detail with the patient. Educated patient on the topical and oral treatment options for mycotic nails. Recommended debridement of the nails today. Sharp and mechanical debridement performed of all painful  and mycotic nails today. Nails debrided in length and thickness using a nail nipper to level of comfort. Discussed treatment options including appropriate shoe gear. Follow up as needed for painful nails. ? ? ? ?Return in about 4 weeks (around 11/12/2021) for wound care.  ? ? ?

## 2021-11-11 DIAGNOSIS — R531 Weakness: Secondary | ICD-10-CM | POA: Diagnosis not present

## 2021-11-14 ENCOUNTER — Encounter: Payer: Self-pay | Admitting: Podiatry

## 2021-11-14 ENCOUNTER — Ambulatory Visit (INDEPENDENT_AMBULATORY_CARE_PROVIDER_SITE_OTHER): Payer: Medicaid Other | Admitting: Podiatry

## 2021-11-14 DIAGNOSIS — L97422 Non-pressure chronic ulcer of left heel and midfoot with fat layer exposed: Secondary | ICD-10-CM

## 2021-11-14 DIAGNOSIS — E13621 Other specified diabetes mellitus with foot ulcer: Secondary | ICD-10-CM

## 2021-11-14 NOTE — Progress Notes (Signed)
  Subjective:  Patient ID: Chelsea Barton, female    DOB: 24-Jul-1960,  MRN: [id number]  Chief Complaint  Patient presents with   Foot Ulcer    Follow up ulcer stump of left foot   "I guess its doing alright, I got a bandaid on it today"    61 y.o. female presents with the above complaint. History confirmed with patient.  Denies fevers chills nausea vomiting.    Objective:  Physical Exam: Skin flaps warm and well perfused.  Open ulceration with fiber granular wound bed measuring 3.5 x 1.6 x 0.4 cm left foot, fibrogranular wound bed, no gangrene signs of infection purulence or malodor is noted              Assessment:   1. 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  Ulcer left foot -We discussed the etiology and factors that are a part of the wound healing process.  We also discussed the risk of infection both soft tissue and osteomyelitis from open ulceration.  Discussed the risk of limb loss if this happens or worsens. -Debridement as below. -Dressed with Iodosorb and DSD.  Continue Prisma, gentamicin ointment and dressings at home.  I will see her back  Procedure: Excisional Debridement of Wound Rationale: Removal of non-viable soft tissue from the wound to promote healing.  Anesthesia: none Post-Debridement Wound Measurements: 3.5x1.6 x 0.4 cm Type of Debridement: Sharp Excisional Tissue Removed: Non-viable soft tissue Depth of Debridement: To level of bone Technique: Sharp excisional debridement to bleeding, viable wound base.  Dressing: Dry, sterile, compression dressing. Disposition: Patient tolerated procedure well. Patient to return in 2 week for follow-up.      Return in about 3 weeks (around 12/05/2021) for wound care.

## 2021-11-21 ENCOUNTER — Ambulatory Visit: Payer: Medicaid Other | Admitting: Internal Medicine

## 2021-11-27 ENCOUNTER — Ambulatory Visit (INDEPENDENT_AMBULATORY_CARE_PROVIDER_SITE_OTHER): Payer: Medicaid Other | Admitting: Family

## 2021-11-27 ENCOUNTER — Encounter: Payer: Self-pay | Admitting: Family

## 2021-11-27 VITALS — BP 132/78 | HR 70 | Temp 97.7°F | Ht 65.0 in | Wt 196.0 lb

## 2021-11-27 DIAGNOSIS — E119 Type 2 diabetes mellitus without complications: Secondary | ICD-10-CM | POA: Diagnosis not present

## 2021-11-27 DIAGNOSIS — I700 Atherosclerosis of aorta: Secondary | ICD-10-CM

## 2021-11-27 DIAGNOSIS — I10 Essential (primary) hypertension: Secondary | ICD-10-CM

## 2021-11-27 LAB — COMPREHENSIVE METABOLIC PANEL
ALT: 16 U/L (ref 0–35)
AST: 19 U/L (ref 0–37)
Albumin: 3.7 g/dL (ref 3.5–5.2)
Alkaline Phosphatase: 75 U/L (ref 39–117)
BUN: 38 mg/dL — ABNORMAL HIGH (ref 6–23)
CO2: 29 mEq/L (ref 19–32)
Calcium: 9.4 mg/dL (ref 8.4–10.5)
Chloride: 102 mEq/L (ref 96–112)
Creatinine, Ser: 2.32 mg/dL — ABNORMAL HIGH (ref 0.40–1.20)
GFR: 22.03 mL/min — ABNORMAL LOW (ref >60.00)
Glucose, Bld: 223 mg/dL — ABNORMAL HIGH (ref 70–99)
Potassium: 5.1 mEq/L (ref 3.5–5.1)
Sodium: 139 mEq/L (ref 135–145)
Total Bilirubin: 0.5 mg/dL (ref 0.2–1.2)
Total Protein: 7 g/dL (ref 6.0–8.3)

## 2021-11-27 LAB — POCT GLYCOSYLATED HEMOGLOBIN (HGB A1C): Hemoglobin A1C: 7.8 % — AB (ref 4.0–5.6)

## 2021-11-27 NOTE — Assessment & Plan Note (Signed)
Chronic, stable.  Continue Coreg 12.5 mg twice per day, enalapril 40 mg, nifedipine 30 mg daily

## 2021-11-27 NOTE — Progress Notes (Signed)
Discussed during OV. Please see OV notes

## 2021-11-27 NOTE — Assessment & Plan Note (Signed)
Chronic, symptomatically stable.  Continue Crestor 20 mg

## 2021-11-27 NOTE — Progress Notes (Signed)
Subjective:    Patient ID: Chelsea Barton, female    DOB: 1960/11/05, 61 y.o.   MRN: [id number]  CC: Chelsea Barton is a 61 y.o. female who presents today for follow up.   HPI: Feels well today.  No new complaints   HTN-compliant with Coreg 12.5 mg twice per day, enalapril 40 mg, nifedipine 30 mg daily  No cp.   atherosclerosis-compliant with Crestor 20 mg.  CT chest lung cancer screening obtained 09/19/21  dm type 1- following with Dr Kelton Pillar; follow up scheduled 12/19/21  She continues to follow with Adamd Sherryle Lis, DPM for left diabetic ulcer s/p left midfoot amputation. She reports healed. No purulent discharge.   PAD-following with vascular, last seen Eulogio Ditch 09/19/2021  Declines screening for colon cancer with colonoscopy or cologuard.  HISTORY:  Past Medical History:  Diagnosis Date   Diabetes mellitus    Hyperlipidemia    Hypertension    Past Surgical History:  Procedure Laterality Date   AMPUTATION TOE  04/10/2021   Procedure: AMPUTATION 2nd TOE, left foot;  Surgeon: Edrick Kins, DPM;  Location: [facility];  Service: Podiatry;;   CATARACT EXTRACTION     eye surgery r Right    lense placed   INCISION AND DRAINAGE Left 04/18/2021   Procedure: INCISION AND DRAINAGE;  Surgeon: [name], DPM;  Location: [facility];  Service: Podiatry;  Laterality: Left;   INCISION AND DRAINAGE OF WOUND Left 04/10/2021   Procedure: IRRIGATION AND DEBRIDEMENT WOUND;  Surgeon: Edrick Kins, DPM;  Location: [facility];  Service: Podiatry;  Laterality: Left;   PERIPHERAL VASCULAR BALLOON ANGIOPLASTY Left 04/13/2021   Procedure: PERIPHERAL VASCULAR BALLOON ANGIOPLASTY;  Surgeon: Katha Cabal, MD;  Location: [facility];  Service: Cardiovascular;  Laterality: Left;   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   Procedure: TRANSMETATARSAL AMPUTATION;  Surgeon: Felipa Furnace, DPM;  Location: [facility];  Service: Podiatry;  Laterality: Left;   Family History  Problem  Relation Age of Onset   Diabetes Sister        3/5 sisters have DM   Breast cancer Neg Hx    Colon cancer Neg Hx     Allergies: Patient has no known allergies. Current Outpatient Medications on File Prior to Visit  Medication Sig Dispense Refill   aspirin 81 MG EC tablet Take 1 tablet (81 mg total) by mouth daily. Swallow whole. 30 tablet 12   carvedilol (COREG) 12.5 MG tablet Take 1 tablet (12.5 mg total) by mouth 2 (two) times daily with a meal. Take one tablet by mouth daily with a meal. 180 tablet 1   Continuous Blood Gluc Sensor (DEXCOM G6 SENSOR) MISC Inject 1 Device into the skin as directed. Every 9 each 3   Continuous Blood Gluc Transmit (DEXCOM G6 TRANSMITTER) MISC Inject 1 Device into the skin as directed. Every 10 days 1 each 3   dapagliflozin propanediol (FARXIGA) 10 MG TABS tablet Take 10 mg by mouth daily.     enalapril (VASOTEC) 20 MG tablet Take 2 tablets (40 mg total) by mouth daily. 180 tablet 3   gentamicin ointment (GARAMYCIN) 0.1 % Apply 1 application. topically daily. 30 g 2   glucose blood (ACCU-CHEK AVIVA PLUS) test strip Used to check blood sugar 4x daily. 200 each 12   hydrALAZINE (APRESOLINE) 10 MG tablet TAKE 1/2 TABLET BY MOUTH THREE TIMES DAILY AS NEEDED(IF BLOOD PRESSURE IS GREATER THAN 140/90) 30 tablet 1   insulin glargine (LANTUS) 100 UNIT/ML  Solostar Pen Inject 36 Units into the skin daily. 30 mL 6   Insulin Pen Needle (EASY TOUCH PEN NEEDLES) 31G X 5 MM MISC Check sugar 3-4 daily 100 each 2   Insulin Pen Needle 31G X 5 MM MISC 1 Device by Does not apply route in the morning, at noon, in the evening, and at bedtime. 400 each 2   Multiple Vitamin (MULTIVITAMIN) capsule Take 1 capsule by mouth daily.     NIFEdipine (PROCARDIA-XL/NIFEDICAL-XL) 30 MG 24 hr tablet Take 1 tablet (30 mg total) by mouth daily. 30 tablet 12   NOVOLOG FLEXPEN 100 UNIT/ML FlexPen Max daily 40 units 30 mL 6   rosuvastatin (CRESTOR) 10 MG tablet Take 1 tablet (10 mg total) by mouth  daily. 90 tablet 3   Vitamin D, Cholecalciferol, 10 MCG (400 UNIT) CAPS Take 1 capsule by mouth daily.     No current facility-administered medications on file prior to visit.    Social History   Tobacco Use   Smoking status: Former    Types: Cigarettes    Quit date: 06/18/2005    Years since quitting: 16.4   Smokeless tobacco: Never  Vaping Use   Vaping Use: Never used  Substance Use Topics   Alcohol use: No   Drug use: No    Review of Systems  Constitutional:  Negative for chills and fever.  Respiratory:  Negative for cough.   Cardiovascular:  Negative for chest pain and palpitations.  Gastrointestinal:  Negative for nausea and vomiting.      Objective:    BP 132/78 (BP Location: Left Arm, Patient Position: Sitting, Cuff Size: Normal)   Pulse 70   Temp 97.7 F (36.5 C) (Oral)   Ht 5\' 5"  (1.651 m)   Wt 196 lb (88.9 kg)   SpO2 99%   BMI 32.62 kg/m  BP Readings from Last 3 Encounters:  11/27/21 132/78  09/19/21 (!) 184/80  09/07/21 (!) 148/88   Wt Readings from Last 3 Encounters:  11/27/21 196 lb (88.9 kg)  09/19/21 194 lb (88 kg)  09/18/21 185 lb (83.9 kg)    Physical Exam Vitals reviewed.  Constitutional:      Appearance: She is well-developed.  Eyes:     Conjunctiva/sclera: Conjunctivae normal.  Cardiovascular:     Rate and Rhythm: Normal rate and regular rhythm.     Pulses: Normal pulses.     Heart sounds: Normal heart sounds.  Pulmonary:     Effort: Pulmonary effort is normal.     Breath sounds: Normal breath sounds. No wheezing, rhonchi or rales.  Skin:    General: Skin is warm and dry.  Neurological:     Mental Status: She is alert.  Psychiatric:        Speech: Speech normal.        Behavior: Behavior normal.        Thought Content: Thought content normal.        Assessment & Plan:   Problem List Items Addressed This Visit       Cardiovascular and Mediastinum   Aortic atherosclerosis (HCC)    Chronic, symptomatically stable.   Continue Crestor 20 mg      Hypertension - Primary    Chronic, stable.  Continue Coreg 12.5 mg twice per day, enalapril 40 mg, nifedipine 30 mg daily       Relevant Orders   POCT HgB A1C (Completed)   Comprehensive metabolic panel     I am having Heatherly D. Grade maintain  her multivitamin, aspirin EC, dapagliflozin propanediol, Vitamin D (Cholecalciferol), rosuvastatin, NIFEdipine, enalapril, carvedilol, NovoLOG FlexPen, insulin glargine, Insulin Pen Needle, Dexcom G6 Transmitter, Easy Touch Pen Needles, hydrALAZINE, Dexcom G6 Sensor, Accu-Chek Aviva Plus, and gentamicin ointment.   No orders of the defined types were placed in this encounter.   Return precautions given.   Risks, benefits, and alternatives of the medications and treatment plan prescribed today were discussed, and patient expressed understanding.   Education regarding symptom management and diagnosis given to patient on AVS.  Continue to follow with Burnard Hawthorne, FNP for routine health maintenance.   Kiera D Snowball and I agreed with plan.   Mable Paris, FNP

## 2021-12-05 ENCOUNTER — Telehealth: Payer: Self-pay

## 2021-12-05 ENCOUNTER — Ambulatory Visit: Payer: Medicaid Other | Admitting: Podiatry

## 2021-12-05 DIAGNOSIS — E13621 Other specified diabetes mellitus with foot ulcer: Secondary | ICD-10-CM

## 2021-12-05 DIAGNOSIS — L97422 Non-pressure chronic ulcer of left heel and midfoot with fat layer exposed: Secondary | ICD-10-CM | POA: Diagnosis not present

## 2021-12-05 NOTE — Telephone Encounter (Signed)
Patient dropped off form to be completed by Mable Paris, FNP.  Form is in the color folder up front.

## 2021-12-05 NOTE — Telephone Encounter (Signed)
Called patient to inform her that I had scheduled her Eye exam at [facility] for 03/14/22 @ 8:30 am

## 2021-12-05 NOTE — Telephone Encounter (Signed)
Patient states she is returning our call.  I relayed Jenate Martinique, CMA's message to patient.

## 2021-12-05 NOTE — Progress Notes (Signed)
  Subjective:  Patient ID: Chelsea Barton, female    DOB: 03-Oct-1960,  MRN: [id number]  Chief Complaint  Patient presents with   Diabetic Ulcer    3 week follow up left foot    61 y.o. female presents with the above complaint. History confirmed with patient.  Denies fevers chills nausea vomiting.    Objective:  Physical Exam: Skin flaps warm and well perfused.  Open ulceration with fiber granular wound bed measuring 5.0 x 2.6 x 0.4 cm left foot, fibrogranular wound bed, no gangrene signs of infection purulence or malodor is noted               Assessment:   No diagnosis found.      Plan:  Patient was evaluated and treated and all questions answered.  Ulcer left foot -We discussed the etiology and factors that are a part of the wound healing process.  We also discussed the risk of infection both soft tissue and osteomyelitis from open ulceration.  Discussed the risk of limb loss if this happens or worsens. -Debridement as below. -Dressed with Iodosorb and DSD. -Wound continues to wax and wane and has not had significant reduction in dimensions over the last few weeks.  I would recommend operative debridement and application of a skin substitute and a likely negative pressure wound therapy device as an outpatient.  She will be scheduled for this.  Informed consent was signed and reviewed we discussed the risk and benefits of this and potential complication that could arise from this procedure.  She understands and wishes to proceed.  Procedure: Excisional Debridement of Wound Rationale: Removal of non-viable soft tissue from the wound to prom 6 ote healing.  Anesthesia: none Post-Debridement Wound Measurements: 5.0 by 2.6 x 0.4 cm Type of Debridement: Sharp Excisional Tissue Removed: Non-viable soft tissue Depth of Debridement: To level of bone Technique: Sharp excisional debridement to bleeding, viable wound base.  Dressing: Dry, sterile, compression  dressing. Disposition: Patient tolerated procedure well. Patient to return in 3 week for follow-up.      Return in about 3 weeks (around 12/26/2021) for wound care.

## 2021-12-05 NOTE — Telephone Encounter (Signed)
Pt called and aware of appointment.

## 2021-12-11 ENCOUNTER — Telehealth: Payer: Self-pay

## 2021-12-11 ENCOUNTER — Other Ambulatory Visit: Payer: Self-pay

## 2021-12-11 DIAGNOSIS — I10 Essential (primary) hypertension: Secondary | ICD-10-CM

## 2021-12-11 MED ORDER — ENALAPRIL MALEATE 20 MG PO TABS: 40.0000 mg | ORAL_TABLET | Freq: Every day | ORAL | 3 refills | Status: DC

## 2021-12-11 MED ORDER — DAPAGLIFLOZIN PROPANEDIOL 10 MG PO TABS: 10.0000 mg | ORAL_TABLET | Freq: Every day | ORAL | 3 refills | Status: DC

## 2021-12-11 NOTE — Telephone Encounter (Signed)
Patient states she needs refills for dapagliflozin propanediol (FARXIGA) 10 MG TABS tablet and enalapril (VASOTEC) 20 MG tablet.  *Patient states her preferred pharmacy is [facility] near [place]. [employer] in [place].

## 2021-12-12 DIAGNOSIS — R531 Weakness: Secondary | ICD-10-CM | POA: Diagnosis not present

## 2021-12-18 NOTE — Progress Notes (Unsigned)
Name: Chelsea Barton  MRN/ DOB: [id number], 1961/04/30   Age/ Sex: 61 y.o., female    PCP: Burnard Hawthorne, FNP   Reason for Endocrinology Evaluation: Type 2 Diabetes Mellitus     Date of Initial Endocrinology Visit: 07/20/2021    PATIENT IDENTIFIER: [name] is a 61 y.o. female with a past medical history of T2DM , CAD and Dyslipidemia. The patient presented for initial endocrinology clinic visit on 07/20/2021 for consultative assistance with her diabetes management.    HPI: Chelsea Barton was    Diagnosed with DM in many years ago  Prior Medications tried/Intolerance: Metformin Currently checking blood sugars multiple x / day, through CGM.  Hemoglobin A1c has ranged from 7.3% in 2020, peaking at 9.5% in 2014.  Follows with Nephrology      SUBJECTIVE:   During the last visit (07/20/2021): A1c 7.4%  Today (@TD @): @M @ @LNAME @  @CAPHE @ checks @HIS @ blood sugars *** times daily, preprandial to breakfast and ***. The patient has *** had hypoglycemic episodes since the last clinic visit, which typically occur *** x / - most often occuring ***. The patient is *** symptomatic with these episodes, with symptoms of {symptoms; hypoglycemia:[id number]}.    HOME DIABETES REGIMEN: Lantus  36 units daily NovoLog 5 units with breakfast, 10 units with lunch, and 12 units with supper Correction factor: NovoLog (BG -130/30) Farxiga 10 mg through nephrology    Statin: yes ACE-I/ARB: no    CONTINUOUS GLUCOSE MONITORING RECORD INTERPRETATION    Dates of Recording: 1/21-07/20/2021  Sensor description:dexcom  Results statistics:   CGM use % of time 93  Average and SD 165/59  Time in range     60   %  % Time Above 180 30  % Time above 250 8  % Time Below target 1     Glycemic patterns summary: OPtimal BG's overnight with occasional tight BG's . Pt noted with hyperglycemia during the day   Hyperglycemic episodes  post prandial   Hypoglycemic episodes occurred during the day  and night   Overnight periods: trend down but variable      DIABETIC COMPLICATIONS: Microvascular complications:  CKD IV, retinopathy, S/P left metatarsal amputation  Denies: neuropathy  Last eye exam: Completed 2022  Macrovascular complications:  CAD Denies:  PVD, CVA   PAST HISTORY: Past Medical History:  Past Medical History:  Diagnosis Date   Diabetes mellitus    Hyperlipidemia    Hypertension    Past Surgical History:  Past Surgical History:  Procedure Laterality Date   AMPUTATION TOE  04/10/2021   Procedure: AMPUTATION 2nd TOE, left foot;  Surgeon: Edrick Kins, DPM;  Location: [facility];  Service: Podiatry;;   CATARACT EXTRACTION     eye surgery r Right    lense placed   INCISION AND DRAINAGE Left 04/18/2021   Procedure: INCISION AND DRAINAGE;  Surgeon: [name], DPM;  Location: [facility];  Service: Podiatry;  Laterality: Left;   INCISION AND DRAINAGE OF WOUND Left 04/10/2021   Procedure: IRRIGATION AND DEBRIDEMENT WOUND;  Surgeon: Edrick Kins, DPM;  Location: [facility];  Service: Podiatry;  Laterality: Left;   PERIPHERAL VASCULAR BALLOON ANGIOPLASTY Left 04/13/2021   Procedure: PERIPHERAL VASCULAR BALLOON ANGIOPLASTY;  Surgeon: Katha Cabal, MD;  Location: [facility];  Service: Cardiovascular;  Laterality: Left;   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   Procedure: TRANSMETATARSAL AMPUTATION;  Surgeon: Felipa Furnace, DPM;  Location: [facility];  Service: Podiatry;  Laterality:  Left;    Social History:  reports that she quit smoking about 16 years ago. Her smoking use included cigarettes. She has never used smokeless tobacco. She reports that she does not drink alcohol and does not use drugs. Family History:  Family History  Problem Relation Age of Onset   Diabetes Sister        3/5 sisters have DM   Breast cancer Neg Hx    Colon cancer Neg Hx      HOME MEDICATIONS: Allergies as of 12/19/2021   No Known Allergies       Medication List        Accurate as of December 18, 2021  7:43 AM. If you have any questions, ask your nurse or doctor.          Accu-Chek Aviva Plus test strip Generic drug: glucose blood Used to check blood sugar 4x daily.   aspirin EC 81 MG tablet Take 1 tablet (81 mg total) by mouth daily. Swallow whole.   carvedilol 12.5 MG tablet Commonly known as: COREG Take 1 tablet (12.5 mg total) by mouth 2 (two) times daily with a meal. Take one tablet by mouth daily with a meal.   dapagliflozin propanediol 10 MG Tabs tablet Commonly known as: FARXIGA Take 1 tablet (10 mg total) by mouth daily.   Dexcom G6 Sensor Misc Inject 1 Device into the skin as directed. Every   Dexcom G6 Transmitter Misc Inject 1 Device into the skin as directed. Every 10 days   enalapril 20 MG tablet Commonly known as: VASOTEC Take 2 tablets (40 mg total) by mouth daily.   gentamicin ointment 0.1 % Commonly known as: GARAMYCIN Apply 1 application. topically daily.   hydrALAZINE 10 MG tablet Commonly known as: APRESOLINE TAKE 1/2 TABLET BY MOUTH THREE TIMES DAILY AS NEEDED(IF BLOOD PRESSURE IS GREATER THAN 140/90)   insulin glargine 100 UNIT/ML Solostar Pen Commonly known as: LANTUS Inject 36 Units into the skin daily.   Insulin Pen Needle 31G X 5 MM Misc 1 Device by Does not apply route in the morning, at noon, in the evening, and at bedtime.   Easy Touch Pen Needles 31G X 5 MM Misc Generic drug: Insulin Pen Needle Check sugar 3-4 daily   multivitamin capsule Take 1 capsule by mouth daily.   NIFEdipine 30 MG 24 hr tablet Commonly known as: PROCARDIA-XL/NIFEDICAL-XL Take 1 tablet (30 mg total) by mouth daily.   NovoLOG FlexPen 100 UNIT/ML FlexPen Generic drug: insulin aspart Max daily 40 units   rosuvastatin 10 MG tablet Commonly known as: Crestor Take 1 tablet (10 mg total) by mouth daily.   Vitamin D (Cholecalciferol) 10 MCG (400 UNIT) Caps Take 1 capsule by mouth daily.          ALLERGIES: No Known Allergies   REVIEW OF SYSTEMS: A comprehensive ROS was conducted with the patient and is negative except as per HPI and below:  Review of Systems  Gastrointestinal:  Negative for diarrhea, nausea and vomiting.      OBJECTIVE:   VITAL SIGNS: There were no vitals taken for this visit.   PHYSICAL EXAM:  General: Pt appears well and is in NAD  Neck: General: Supple without adenopathy or carotid bruits. Thyroid: Thyroid size normal.  No goiter or nodules appreciated.   Lungs: Clear with good BS bilat with no rales, rhonchi, or wheezes  Heart: RRR with normal S1 and S2 and no gallops; no murmurs; no rub  Abdomen: Normoactive bowel sounds, soft,  nontender, without masses or organomegaly palpable  Extremities:  Lower extremities - Trace edema on the left, left foot boot in place. No edema on the right   Neuro: MS is good with appropriate affect, pt is alert and Ox3     DATA REVIEWED:  Lab Results  Component Value Date   HGBA1C 7.8 (A) 11/27/2021   HGBA1C 7.4 (H) 07/11/2021   HGBA1C 7.8 (H) 04/10/2021   Lab Results  Component Value Date   MICROALBUR 41.9 (H) 07/11/2021   LDLCALC 42 04/12/2021   CREATININE 2.32 (H) 11/27/2021   Lab Results  Component Value Date   MICRALBCREAT 75.1 (H) 07/11/2021    Lab Results  Component Value Date   CHOL 84 04/12/2021   HDL 25 (L) 04/12/2021   LDLCALC 42 04/12/2021   LDLDIRECT 84.9 09/10/2012   TRIG 87 04/12/2021   CHOLHDL 3.4 04/12/2021        ASSESSMENT / PLAN / RECOMMENDATIONS:   1) Type 2 Diabetes Mellitus, Sub-Optimally controlled, With retinopathic, CKD IV and macrovascular complications - Most recent A1c of 7.4 %. Goal A1c < 7.0 %.    - Her A1c is skewed due to CKD - Discussed importance of glycemic control to prevent worsening of diabetic retinopathy, CKD, and to prevent any more amputations - Declines GLP-1 agonists  - Declines insulin pump  -Her GFR is less than 25, hence there is no  indication from the endocrinology standpoint to be on SGLT2 inhibitors, per patient this is prescribed by her nephrology so we will defer this to nephrology. -I am going to adjust her insulin as below and provide her with a correction scale -Due to hypoglycemia overnight and sometimes fasting I am going to reduce her Lantus dose  MEDICATIONS: Decrease Lantus to 36 units daily NovoLog 5 units with breakfast, 10 units with lunch, and 12 units with supper Correction factor: NovoLog (BG -130/30)   EDUCATION / INSTRUCTIONS: BG monitoring instructions: Patient is instructed to check her blood sugars 3 times a day, before meals. Call [facility] if: BG persistently < 70 . I reviewed the Rule of 15 for the treatment of hypoglycemia in detail with the patient. Literature supplied.   2) Diabetic complications:  Eye: Does  have known diabetic retinopathy.  Neuro/ Feet: Does not have known diabetic peripheral neuropathy. Renal: Patient does have known baseline CKD. She is on an ACEI/ARB at present.   Follow-up in 4 months    Signed electronically by: Mack Guise, MD  [facility]  [address] [place], [place] [address] Phone: [phone] FAX: [phone]   CC: Burnard Hawthorne, FNP [address] [place] [address] Phone: [phone]  Fax: [phone]    Return to Endocrinology clinic as below: Future Appointments  Date Time Provider Ruth  12/19/2021  9:10 AM Mikalia Fessel, Melanie Crazier, MD [facility] None  03/21/2022  9:00 AM [facility] 1 [facility] None  03/21/2022 10:00 AM Kris Hartmann, NP [facility] None  05/29/2022  8:30 AM Arnett, Yvetta Coder, FNP [facility]

## 2021-12-19 ENCOUNTER — Ambulatory Visit (INDEPENDENT_AMBULATORY_CARE_PROVIDER_SITE_OTHER): Payer: Medicaid Other | Admitting: Internal Medicine

## 2021-12-19 ENCOUNTER — Ambulatory Visit: Payer: Medicaid Other | Admitting: Internal Medicine

## 2021-12-19 ENCOUNTER — Encounter: Payer: Self-pay | Admitting: Internal Medicine

## 2021-12-19 VITALS — BP 126/82 | HR 80 | Ht 65.0 in | Wt 198.6 lb

## 2021-12-19 DIAGNOSIS — Z794 Long term (current) use of insulin: Secondary | ICD-10-CM | POA: Diagnosis not present

## 2021-12-19 DIAGNOSIS — E1122 Type 2 diabetes mellitus with diabetic chronic kidney disease: Secondary | ICD-10-CM

## 2021-12-19 DIAGNOSIS — E11319 Type 2 diabetes mellitus with unspecified diabetic retinopathy without macular edema: Secondary | ICD-10-CM

## 2021-12-19 DIAGNOSIS — E1159 Type 2 diabetes mellitus with other circulatory complications: Secondary | ICD-10-CM

## 2021-12-19 DIAGNOSIS — N184 Chronic kidney disease, stage 4 (severe): Secondary | ICD-10-CM | POA: Diagnosis not present

## 2021-12-19 LAB — POCT GLUCOSE (DEVICE FOR HOME USE): Glucose Fasting, POC: 262 mg/dL — AB (ref 70–99)

## 2021-12-19 NOTE — Patient Instructions (Signed)
-   Continue  Lantus to 36 units daily  - Novolog 5 units with Breakfast, 10 units with Lunch and 12 units with Supper  - Novolog correctional insulin: ADD extra units on insulin to your meal-time Novolog dose if your blood sugars are higher than 160. Use the scale below to help guide you:   Blood sugar before meal Number of units to inject  Less than 160 0 unit  161 -  190 1 units  191 -  220 2 units  221 -  250 3 units  251 -  280 4 units  281 -  310 5 units  311 -  340 6 units  341 -  370 7 units  371 -  400 8 units   HOW TO TREAT LOW BLOOD SUGARS (Blood sugar LESS THAN 70 MG/DL) Please follow the RULE OF 15 for the treatment of hypoglycemia treatment (when your (blood sugars are less than 70 mg/dL)   STEP 1: Take 15 grams of carbohydrates when your blood sugar is low, which includes:  3-4 GLUCOSE TABS  OR 3-4 OZ OF JUICE OR REGULAR SODA OR ONE TUBE OF GLUCOSE GEL    STEP 2: RECHECK blood sugar in 15 MINUTES STEP 3: If your blood sugar is still low at the 15 minute recheck --> then, go back to STEP 1 and treat AGAIN with another 15 grams of carbohydrates.

## 2021-12-21 ENCOUNTER — Ambulatory Visit (INDEPENDENT_AMBULATORY_CARE_PROVIDER_SITE_OTHER): Payer: Medicaid Other | Admitting: Family

## 2021-12-21 ENCOUNTER — Telehealth: Payer: Self-pay

## 2021-12-21 ENCOUNTER — Encounter: Payer: Self-pay | Admitting: Family

## 2021-12-21 VITALS — BP 120/63 | HR 63 | Temp 97.9°F | Ht 65.0 in

## 2021-12-21 DIAGNOSIS — I10 Essential (primary) hypertension: Secondary | ICD-10-CM | POA: Diagnosis not present

## 2021-12-21 DIAGNOSIS — Z01818 Encounter for other preprocedural examination: Secondary | ICD-10-CM

## 2021-12-21 DIAGNOSIS — Z89432 Acquired absence of left foot: Secondary | ICD-10-CM

## 2021-12-21 NOTE — H&P (View-Only) (Signed)
Subjective:    Patient ID: Chelsea Barton, female    DOB: July 05, 1960, 61 y.o.   MRN: [id number]  CC: Chelsea Barton is a 61 y.o. female who presents today for pre op clearance.   HPI: Feels well today.  No new complaints I  She is having debridement of left foot with Rolla Etienne 12/26/21 for callus. Wound is healed. No foot pain.   Hypertension-compliant with carvedilol 12.5 mg twice daily, enalapril 40 mg, Farxiga 10 mg.  She denies chest pain shortness of breath, leg swelling  She follows with endocrine Dr. Kelton Pillar for type 1 diabetes History of mitral valve insufficiency, hyperlipidemia and following with Dr. Charlestine Night, cardiology.  Last seen 09/07/21   HISTORY:  Past Medical History:  Diagnosis Date  . Chronic kidney disease   . Diabetes mellitus   . Hyperlipidemia   . Hypertension    Past Surgical History:  Procedure Laterality Date  . AMPUTATION TOE  04/10/2021   Procedure: AMPUTATION 2nd TOE, left foot;  Surgeon: Edrick Kins, DPM;  Location: [facility];  Service: Podiatry;;  . CATARACT EXTRACTION    . eye surgery r Right    lense placed  . INCISION AND DRAINAGE Left 04/18/2021   Procedure: INCISION AND DRAINAGE;  Surgeon: [name], DPM;  Location: [facility];  Service: Podiatry;  Laterality: Left;  . INCISION AND DRAINAGE OF WOUND Left 04/10/2021   Procedure: IRRIGATION AND DEBRIDEMENT WOUND;  Surgeon: Edrick Kins, DPM;  Location: [facility];  Service: Podiatry;  Laterality: Left;  . PERIPHERAL VASCULAR BALLOON ANGIOPLASTY Left 04/13/2021   Procedure: PERIPHERAL VASCULAR BALLOON ANGIOPLASTY;  Surgeon: Katha Cabal, MD;  Location: [facility];  Service: Cardiovascular;  Laterality: Left;  . 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   Procedure: TRANSMETATARSAL AMPUTATION;  Surgeon: Felipa Furnace, DPM;  Location: [facility];  Service: Podiatry;  Laterality: Left;   Family History  Problem Relation Age of Onset  . Diabetes Sister        3/5  sisters have DM  . Breast cancer Neg Hx   . Colon cancer Neg Hx     Allergies: Patient has no known allergies. Current Outpatient Medications on File Prior to Visit  Medication Sig Dispense Refill  . aspirin 81 MG EC tablet Take 1 tablet (81 mg total) by mouth daily. Swallow whole. 30 tablet 12  . carvedilol (COREG) 12.5 MG tablet Take 1 tablet (12.5 mg total) by mouth 2 (two) times daily with a meal. Take one tablet by mouth daily with a meal. 180 tablet 1  . Continuous Blood Gluc Sensor (DEXCOM G6 SENSOR) MISC Inject 1 Device into the skin as directed. Every 9 each 3  . Continuous Blood Gluc Transmit (DEXCOM G6 TRANSMITTER) MISC Inject 1 Device into the skin as directed. Every 10 days 1 each 3  . dapagliflozin propanediol (FARXIGA) 10 MG TABS tablet Take 1 tablet (10 mg total) by mouth daily. 30 tablet 3  . enalapril (VASOTEC) 20 MG tablet Take 2 tablets (40 mg total) by mouth daily. 180 tablet 3  . gentamicin ointment (GARAMYCIN) 0.1 % Apply 1 application. topically daily. 30 g 2  . glucose blood (ACCU-CHEK AVIVA PLUS) test strip Used to check blood sugar 4x daily. 200 each 12  . hydrALAZINE (APRESOLINE) 10 MG tablet TAKE 1/2 TABLET BY MOUTH THREE TIMES DAILY AS NEEDED(IF BLOOD PRESSURE IS GREATER THAN 140/90) 30 tablet 1  . insulin glargine (LANTUS) 100 UNIT/ML Solostar Pen Inject 36 Units  into the skin daily. (Patient taking differently: Inject 36 Units into the skin at bedtime.) 30 mL 6  . Insulin Pen Needle (EASY TOUCH PEN NEEDLES) 31G X 5 MM MISC Check sugar 3-4 daily 100 each 2  . Insulin Pen Needle 31G X 5 MM MISC 1 Device by Does not apply route in the morning, at noon, in the evening, and at bedtime. 400 each 2  . Multiple Vitamin (MULTIVITAMIN) capsule Take 1 capsule by mouth daily.    Marland Kitchen NIFEdipine (PROCARDIA-XL/NIFEDICAL-XL) 30 MG 24 hr tablet Take 1 tablet (30 mg total) by mouth daily. 30 tablet 12  . NOVOLOG FLEXPEN 100 UNIT/ML FlexPen Max daily 40 units (Patient taking  differently: 5-12 Units 3 (three) times daily with meals. Max daily 40 units 5 u morning, 10 u lunch, 12 u supper) 30 mL 6  . rosuvastatin (CRESTOR) 10 MG tablet Take 1 tablet (10 mg total) by mouth daily. 90 tablet 3  . Vitamin D, Cholecalciferol, 10 MCG (400 UNIT) CAPS Take 1 capsule by mouth daily.     No current facility-administered medications on file prior to visit.    Social History   Tobacco Use  . Smoking status: Former    Types: Cigarettes    Quit date: 06/18/2005    Years since quitting: 16.5  . Smokeless tobacco: Never  Vaping Use  . Vaping Use: Never used  Substance Use Topics  . Alcohol use: No  . Drug use: No    Review of Systems  Constitutional:  Negative for chills and fever.  Respiratory:  Negative for cough.   Cardiovascular:  Negative for chest pain and palpitations.  Gastrointestinal:  Negative for nausea and vomiting.      Objective:    BP 120/63 (BP Location: Left Arm, Patient Position: Sitting, Cuff Size: Normal)   Pulse 63   Temp 97.9 F (36.6 C) (Oral)   Ht 5\' 5"  (1.651 m)   SpO2 95%   BMI 33.05 kg/m  BP Readings from Last 3 Encounters:  12/21/21 120/63  12/19/21 126/82  11/27/21 132/78   Wt Readings from Last 3 Encounters:  12/24/21 192 lb (87.1 kg)  12/19/21 198 lb 9.6 oz (90.1 kg)  11/27/21 196 lb (88.9 kg)    Physical Exam Vitals reviewed.  Constitutional:      Appearance: She is well-developed.  Eyes:     Conjunctiva/sclera: Conjunctivae normal.  Cardiovascular:     Rate and Rhythm: Normal rate and regular rhythm.     Pulses: Normal pulses.     Heart sounds: Normal heart sounds.  Pulmonary:     Effort: Pulmonary effort is normal.     Breath sounds: Normal breath sounds. No wheezing, rhonchi or rales.  Musculoskeletal:     Right lower leg: No edema.     Left lower leg: No edema.  Skin:    General: Skin is warm and dry.  Neurological:     Mental Status: She is alert.  Psychiatric:        Speech: Speech normal.         Behavior: Behavior normal.        Thought Content: Thought content normal.       Assessment & Plan:   Problem List Items Addressed This Visit       Cardiovascular and Mediastinum   Hypertension    Chronic, stable.  Continue carvedilol 12.5 mg twice daily, enalapril 40 mg, Farxiga 10 mg.         Other  Left foot amputee [facility])    Patient is undergoing left foot debridement 12/26/2021.  EKG showed sinus rhythm without acute changes when compared to previous EKG.  I reviewed EKG from July 16, 2021 as well as September 07, 2021.  T wave inversions remain the same.  Q waves are not new.  I I have also sent a note to her cardiologist, Dr. Charlestine Night to make him aware of upcoming procedure to ensure that he is in agreement.  Addendum: 12/24/21:  Consulted with Dr Charlestine Night via secure chat whom advised.  no additional cardiac testing needed. patient is asymptomatic, ekg changes are chronic/non acute.      Other Visit Diagnoses     Pre-operative clearance    -  Primary   Relevant Orders   EKG 12-Lead (Completed)        I am having Denee D. Klos maintain her multivitamin, aspirin EC, Vitamin D (Cholecalciferol), rosuvastatin, NIFEdipine, carvedilol, NovoLOG FlexPen, insulin glargine, Insulin Pen Needle, Dexcom G6 Transmitter, Easy Touch Pen Needles, hydrALAZINE, Dexcom G6 Sensor, Accu-Chek Aviva Plus, gentamicin ointment, enalapril, and dapagliflozin propanediol.   No orders of the defined types were placed in this encounter.   Return precautions given.   Risks, benefits, and alternatives of the medications and treatment plan prescribed today were discussed, and patient expressed understanding.   Education regarding symptom management and diagnosis given to patient on AVS.  Continue to follow with Burnard Hawthorne, FNP for routine health maintenance.   Tamia D Swetz and I agreed with plan.   Mable Paris, FNP

## 2021-12-21 NOTE — Assessment & Plan Note (Addendum)
Patient is undergoing left foot debridement 12/26/2021.  EKG showed sinus rhythm without acute changes when compared to previous EKG.  I reviewed EKG from July 16, 2021 as well as September 07, 2021.  T wave inversions remain the same.  Q waves are not new.  I I have also sent a note to her cardiologist, Dr. Sandie Ano to make him aware of upcoming procedure to ensure that he is in agreement.  Addendum: 12/24/21:  Consulted with Dr Sandie Ano via secure chat whom advised.  no additional cardiac testing needed. patient is asymptomatic, ekg changes are chronic/non acute.

## 2021-12-21 NOTE — Telephone Encounter (Signed)
DOS 12/26/2021  WOUND DEBRIDMENT LT - 50354 APPLICATION OF SKIN GRAFT LT - [place] EFFECTIVE 05/17/2021  Notification or Prior Authorization is not required for the requested services  This [employer] members plan does not currently require a prior authorization for these services. If you have general questions about the prior authorization requirements, please call us at [phone] or visit [email or web address] and select the state where you practice. The number above acknowledges your notification. Please write this number down for future reference. Notification or Prior Authorization does not confirm benefit coverage and is not a guarantee of coverage or payment.  Decision ID #:[id number]

## 2021-12-21 NOTE — Assessment & Plan Note (Signed)
Chronic, stable.  Continue carvedilol 12.5 mg twice daily, enalapril 40 mg, Farxiga 10 mg.

## 2021-12-21 NOTE — Progress Notes (Addendum)
Subjective:    Patient ID: Chelsea Barton, female    DOB: 1961-03-11, 61 y.o.   MRN: [id number]  CC: Chelsea Barton is a 61 y.o. female who presents today for pre op clearance.   HPI: Feels well today.  No new complaints I  She is having debridement of left foot with Rolla Etienne 12/26/21 for callus. Wound is healed. No foot pain.   Hypertension-compliant with carvedilol 12.5 mg twice daily, enalapril 40 mg, Farxiga 10 mg.  She denies chest pain shortness of breath, leg swelling  She follows with endocrine Dr. Kelton Pillar for type 1 diabetes History of mitral valve insufficiency, hyperlipidemia and following with Dr. Charlestine Night, cardiology.  Last seen 09/07/21   HISTORY:  Past Medical History:  Diagnosis Date  . Chronic kidney disease   . Diabetes mellitus   . Hyperlipidemia   . Hypertension    Past Surgical History:  Procedure Laterality Date  . AMPUTATION TOE  04/10/2021   Procedure: AMPUTATION 2nd TOE, left foot;  Surgeon: Edrick Kins, DPM;  Location: [facility];  Service: Podiatry;;  . CATARACT EXTRACTION    . eye surgery r Right    lense placed  . INCISION AND DRAINAGE Left 04/18/2021   Procedure: INCISION AND DRAINAGE;  Surgeon: [name], DPM;  Location: [facility];  Service: Podiatry;  Laterality: Left;  . INCISION AND DRAINAGE OF WOUND Left 04/10/2021   Procedure: IRRIGATION AND DEBRIDEMENT WOUND;  Surgeon: Edrick Kins, DPM;  Location: [facility];  Service: Podiatry;  Laterality: Left;  . PERIPHERAL VASCULAR BALLOON ANGIOPLASTY Left 04/13/2021   Procedure: PERIPHERAL VASCULAR BALLOON ANGIOPLASTY;  Surgeon: Katha Cabal, MD;  Location: [place] [facility];  Service: Cardiovascular;  Laterality: Left;  . 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   Procedure: TRANSMETATARSAL AMPUTATION;  Surgeon: Felipa Furnace, DPM;  Location: [facility];  Service: Podiatry;  Laterality: Left;   Family History  Problem Relation Age of Onset  . Diabetes Sister        3/5  sisters have DM  . Breast cancer Neg Hx   . Colon cancer Neg Hx     Allergies: Patient has no known allergies. Current Outpatient Medications on File Prior to Visit  Medication Sig Dispense Refill  . aspirin 81 MG EC tablet Take 1 tablet (81 mg total) by mouth daily. Swallow whole. 30 tablet 12  . carvedilol (COREG) 12.5 MG tablet Take 1 tablet (12.5 mg total) by mouth 2 (two) times daily with a meal. Take one tablet by mouth daily with a meal. 180 tablet 1  . Continuous Blood Gluc Sensor (DEXCOM G6 SENSOR) MISC Inject 1 Device into the skin as directed. Every 9 each 3  . Continuous Blood Gluc Transmit (DEXCOM G6 TRANSMITTER) MISC Inject 1 Device into the skin as directed. Every 10 days 1 each 3  . dapagliflozin propanediol (FARXIGA) 10 MG TABS tablet Take 1 tablet (10 mg total) by mouth daily. 30 tablet 3  . enalapril (VASOTEC) 20 MG tablet Take 2 tablets (40 mg total) by mouth daily. 180 tablet 3  . gentamicin ointment (GARAMYCIN) 0.1 % Apply 1 application. topically daily. 30 g 2  . glucose blood (ACCU-CHEK AVIVA PLUS) test strip Used to check blood sugar 4x daily. 200 each 12  . hydrALAZINE (APRESOLINE) 10 MG tablet TAKE 1/2 TABLET BY MOUTH THREE TIMES DAILY AS NEEDED(IF BLOOD PRESSURE IS GREATER THAN 140/90) 30 tablet 1  . insulin glargine (LANTUS) 100 UNIT/ML Solostar Pen Inject 36 Units  into the skin daily. (Patient taking differently: Inject 36 Units into the skin at bedtime.) 30 mL 6  . Insulin Pen Needle (EASY TOUCH PEN NEEDLES) 31G X 5 MM MISC Check sugar 3-4 daily 100 each 2  . Insulin Pen Needle 31G X 5 MM MISC 1 Device by Does not apply route in the morning, at noon, in the evening, and at bedtime. 400 each 2  . Multiple Vitamin (MULTIVITAMIN) capsule Take 1 capsule by mouth daily.    Marland Kitchen NIFEdipine (PROCARDIA-XL/NIFEDICAL-XL) 30 MG 24 hr tablet Take 1 tablet (30 mg total) by mouth daily. 30 tablet 12  . NOVOLOG FLEXPEN 100 UNIT/ML FlexPen Max daily 40 units (Patient taking  differently: 5-12 Units 3 (three) times daily with meals. Max daily 40 units 5 u morning, 10 u lunch, 12 u supper) 30 mL 6  . rosuvastatin (CRESTOR) 10 MG tablet Take 1 tablet (10 mg total) by mouth daily. 90 tablet 3  . Vitamin D, Cholecalciferol, 10 MCG (400 UNIT) CAPS Take 1 capsule by mouth daily.     No current facility-administered medications on file prior to visit.    Social History   Tobacco Use  . Smoking status: Former    Types: Cigarettes    Quit date: 06/18/2005    Years since quitting: 16.5  . Smokeless tobacco: Never  Vaping Use  . Vaping Use: Never used  Substance Use Topics  . Alcohol use: No  . Drug use: No    Review of Systems  Constitutional:  Negative for chills and fever.  Respiratory:  Negative for cough.   Cardiovascular:  Negative for chest pain and palpitations.  Gastrointestinal:  Negative for nausea and vomiting.      Objective:    BP 120/63 (BP Location: Left Arm, Patient Position: Sitting, Cuff Size: Normal)   Pulse 63   Temp 97.9 F (36.6 C) (Oral)   Ht 5\' 5"  (1.651 m)   SpO2 95%   BMI 33.05 kg/m  BP Readings from Last 3 Encounters:  12/21/21 120/63  12/19/21 126/82  11/27/21 132/78   Wt Readings from Last 3 Encounters:  12/24/21 192 lb (87.1 kg)  12/19/21 198 lb 9.6 oz (90.1 kg)  11/27/21 196 lb (88.9 kg)    Physical Exam Vitals reviewed.  Constitutional:      Appearance: She is well-developed.  Eyes:     Conjunctiva/sclera: Conjunctivae normal.  Cardiovascular:     Rate and Rhythm: Normal rate and regular rhythm.     Pulses: Normal pulses.     Heart sounds: Normal heart sounds.  Pulmonary:     Effort: Pulmonary effort is normal.     Breath sounds: Normal breath sounds. No wheezing, rhonchi or rales.  Musculoskeletal:     Right lower leg: No edema.     Left lower leg: No edema.  Skin:    General: Skin is warm and dry.  Neurological:     Mental Status: She is alert.  Psychiatric:        Speech: Speech normal.         Behavior: Behavior normal.        Thought Content: Thought content normal.       Assessment & Plan:   Problem List Items Addressed This Visit       Cardiovascular and Mediastinum   Hypertension    Chronic, stable.  Continue carvedilol 12.5 mg twice daily, enalapril 40 mg, Farxiga 10 mg.         Other  Left foot amputee [facility])    Patient is undergoing left foot debridement 12/26/2021.  EKG showed sinus rhythm without acute changes when compared to previous EKG.  I reviewed EKG from July 16, 2021 as well as September 07, 2021.  T wave inversions remain the same.  Q waves are not new.  I I have also sent a note to her cardiologist, Dr. Charlestine Night to make him aware of upcoming procedure to ensure that he is in agreement.  Addendum: 12/24/21:  Consulted with Dr Charlestine Night via secure chat whom advised.  no additional cardiac testing needed. patient is asymptomatic, ekg changes are chronic/non acute.      Other Visit Diagnoses     Pre-operative clearance    -  Primary   Relevant Orders   EKG 12-Lead (Completed)        I am having Arianis D. Knee maintain her multivitamin, aspirin EC, Vitamin D (Cholecalciferol), rosuvastatin, NIFEdipine, carvedilol, NovoLOG FlexPen, insulin glargine, Insulin Pen Needle, Dexcom G6 Transmitter, Easy Touch Pen Needles, hydrALAZINE, Dexcom G6 Sensor, Accu-Chek Aviva Plus, gentamicin ointment, enalapril, and dapagliflozin propanediol.   No orders of the defined types were placed in this encounter.   Return precautions given.   Risks, benefits, and alternatives of the medications and treatment plan prescribed today were discussed, and patient expressed understanding.   Education regarding symptom management and diagnosis given to patient on AVS.  Continue to follow with Burnard Hawthorne, FNP for routine health maintenance.   Airis D Shiffman and I agreed with plan.   Mable Paris, FNP

## 2021-12-24 ENCOUNTER — Other Ambulatory Visit: Payer: Self-pay

## 2021-12-24 ENCOUNTER — Encounter
Admission: RE | Admit: 2021-12-24 | Discharge: 2021-12-24 | Disposition: A | Discharge status: Home / Self Care | Payer: Medicaid Other | Source: Ambulatory Visit | Attending: Podiatry | Admitting: Podiatry

## 2021-12-24 VITALS — Ht 65.0 in | Wt 192.0 lb

## 2021-12-24 DIAGNOSIS — Z794 Long term (current) use of insulin: Secondary | ICD-10-CM

## 2021-12-24 HISTORY — DX: Chronic kidney disease, unspecified: N18.9

## 2021-12-24 NOTE — Telephone Encounter (Signed)
Pt called about forms to be filled out by the provider for [facility]. Pt surgery is wednesday

## 2021-12-24 NOTE — Patient Instructions (Addendum)
Your procedure is scheduled on: 12/26/21 Report to [place]. To find out your arrival time please call [phone] between 1PM - 3PM on 12/25/21.  Remember: Instructions that are not followed completely may result in serious medical risk, up to and including death, or upon the discretion of your surgeon and anesthesiologist your surgery may need to be rescheduled.     _X__ 1. Do not eat food or drink any liquids after midnight the night before your procedure.                 No gum chewing or hard candies.   __X__2.  On the morning of surgery brush your teeth with toothpaste and water, you                 may rinse your mouth with mouthwash if you wish.  Do not swallow any              toothpaste of mouthwash.     _X__ 3.  No Alcohol for 24 hours before or after surgery.   _X__ 4.  Do Not Smoke or use e-cigarettes For 24 Hours Prior to Your Surgery.                 Do not use any chewable tobacco products for at least 6 hours prior to                 surgery.  ____  5.  Bring all medications with you on the day of surgery if instructed.   __X__  6.  Notify your doctor if there is any change in your medical condition      (cold, fever, infections).     Do not wear jewelry, make-up, hairpins, clips or nail polish. Do not wear lotions, powders, or perfumes.  Do not shave 48 hours prior to surgery. Men may shave face and neck. Do not bring valuables to the hospital.    [facility] is not responsible for any belongings or valuables.  Contacts, dentures/partials or body piercings may not be worn into surgery. Bring a case for your contacts, glasses or hearing aids, a denture cup will be supplied. Leave your suitcase in the car. After surgery it may be brought to your room. For patients admitted to the hospital, discharge time is determined by your treatment team.   Patients discharged the day of surgery will not be allowed to drive  home.   __X__ Take these medicines the morning of surgery with A SIP OF WATER:    1. carvedilol  2. nifedipine  3. rosuvastatin  4.  5.  6.  ____ Fleet Enema (as directed)   ____ Use CHG Soap/SAGE wipes as directed  ____ Use inhalers on the day of surgery  __X__ Stop metformin/Janumet/Farxiga 2 days prior to surgery    __X__ Take 1/2 of usual insulin dose the night before surgery. No insulin the morning          of surgery.   ____ Stop Blood Thinners Coumadin/Plavix/Xarelto/Pleta/Pradaxa/Eliquis/Effient/Aspirin  on   Or contact your Surgeon, Cardiologist or Medical Doctor regarding  ability to stop your blood thinners  __X__ Stop Anti-inflammatories 7 days before surgery such as Advil, Ibuprofen, Motrin,  BC or Goodies Powder, Naprosyn, Naproxen, Aleve   __X__ Stop all herbals and supplements, fish oil or vitamins  until after surgery.    ____ Bring C-Pap to the hospital.   [place]  SOAP THE DAY OF YOUR SURGERY

## 2021-12-24 NOTE — Telephone Encounter (Signed)
Spoke to patient and informed her that paperwork was faxed today for he surgery.Chelsea KitchenMarland Barton

## 2021-12-26 ENCOUNTER — Ambulatory Visit: Payer: Medicaid Other | Admitting: Certified Registered"

## 2021-12-26 ENCOUNTER — Encounter
Admission: RE | Disposition: A | Discharge status: Home / Self Care | Payer: Self-pay | Source: Home / Self Care | Attending: Podiatry

## 2021-12-26 ENCOUNTER — Other Ambulatory Visit: Payer: Self-pay

## 2021-12-26 ENCOUNTER — Encounter: Payer: Self-pay | Admitting: Podiatry

## 2021-12-26 ENCOUNTER — Ambulatory Visit
Admission: RE | Admit: 2021-12-26 | Discharge: 2021-12-26 | Disposition: A | Discharge status: Home / Self Care | Payer: Medicaid Other | Attending: Podiatry | Admitting: Podiatry

## 2021-12-26 DIAGNOSIS — L97422 Non-pressure chronic ulcer of left heel and midfoot with fat layer exposed: Secondary | ICD-10-CM | POA: Diagnosis not present

## 2021-12-26 DIAGNOSIS — E10621 Type 1 diabetes mellitus with foot ulcer: Secondary | ICD-10-CM | POA: Diagnosis present

## 2021-12-26 DIAGNOSIS — Z79899 Other long term (current) drug therapy: Secondary | ICD-10-CM | POA: Diagnosis not present

## 2021-12-26 DIAGNOSIS — I129 Hypertensive chronic kidney disease with stage 1 through stage 4 chronic kidney disease, or unspecified chronic kidney disease: Secondary | ICD-10-CM | POA: Insufficient documentation

## 2021-12-26 DIAGNOSIS — E1022 Type 1 diabetes mellitus with diabetic chronic kidney disease: Secondary | ICD-10-CM | POA: Insufficient documentation

## 2021-12-26 DIAGNOSIS — I2510 Atherosclerotic heart disease of native coronary artery without angina pectoris: Secondary | ICD-10-CM | POA: Diagnosis not present

## 2021-12-26 DIAGNOSIS — E11621 Type 2 diabetes mellitus with foot ulcer: Secondary | ICD-10-CM

## 2021-12-26 DIAGNOSIS — Z794 Long term (current) use of insulin: Secondary | ICD-10-CM | POA: Insufficient documentation

## 2021-12-26 DIAGNOSIS — E1051 Type 1 diabetes mellitus with diabetic peripheral angiopathy without gangrene: Secondary | ICD-10-CM | POA: Insufficient documentation

## 2021-12-26 DIAGNOSIS — I340 Nonrheumatic mitral (valve) insufficiency: Secondary | ICD-10-CM | POA: Diagnosis not present

## 2021-12-26 DIAGNOSIS — L97529 Non-pressure chronic ulcer of other part of left foot with unspecified severity: Secondary | ICD-10-CM | POA: Diagnosis not present

## 2021-12-26 DIAGNOSIS — N189 Chronic kidney disease, unspecified: Secondary | ICD-10-CM | POA: Insufficient documentation

## 2021-12-26 DIAGNOSIS — E785 Hyperlipidemia, unspecified: Secondary | ICD-10-CM | POA: Diagnosis not present

## 2021-12-26 HISTORY — PX: GRAFT APPLICATION: SHX6696

## 2021-12-26 HISTORY — PX: I & D EXTREMITY: SHX5045

## 2021-12-26 LAB — GLUCOSE, CAPILLARY
Glucose-Capillary: 256 mg/dL — ABNORMAL HIGH (ref 70–99)
Glucose-Capillary: 229 mg/dL — ABNORMAL HIGH (ref 70–99)
Glucose-Capillary: 210 mg/dL — ABNORMAL HIGH (ref 70–99)

## 2021-12-26 SURGERY — IRRIGATION AND DEBRIDEMENT EXTREMITY
Anesthesia: Monitor Anesthesia Care | Site: Foot | Laterality: Left

## 2021-12-26 MED ORDER — OXYCODONE HCL 5 MG PO TABS: 5.0000 mg | ORAL_TABLET | Freq: Once | ORAL | Status: DC | PRN

## 2021-12-26 MED ORDER — SODIUM CHLORIDE 0.9 % IV SOLN: INTRAVENOUS | Status: DC

## 2021-12-26 MED ORDER — ACETAMINOPHEN 10 MG/ML IV SOLN
INTRAVENOUS | Status: AC
  Filled 2021-12-26: qty 100

## 2021-12-26 MED ORDER — CHLORHEXIDINE GLUCONATE 0.12 % MT SOLN: 15.0000 mL | Freq: Once | OROMUCOSAL | Status: AC

## 2021-12-26 MED ORDER — LIDOCAINE HCL 1 % IJ SOLN
INTRAMUSCULAR | Status: DC | PRN
  Administered 2021-12-26: 12 mL

## 2021-12-26 MED ORDER — FAMOTIDINE 20 MG PO TABS
ORAL_TABLET | ORAL | Status: AC
  Administered 2021-12-26: 20 mg via ORAL
  Filled 2021-12-26: qty 1

## 2021-12-26 MED ORDER — PROPOFOL 1000 MG/100ML IV EMUL
INTRAVENOUS | Status: AC
  Filled 2021-12-26: qty 100

## 2021-12-26 MED ORDER — MIDAZOLAM HCL 2 MG/2ML IJ SOLN
INTRAMUSCULAR | Status: DC | PRN
  Administered 2021-12-26: 2 mg via INTRAVENOUS

## 2021-12-26 MED ORDER — MIDAZOLAM HCL 2 MG/2ML IJ SOLN
INTRAMUSCULAR | Status: AC
  Filled 2021-12-26: qty 2

## 2021-12-26 MED ORDER — ORAL CARE MOUTH RINSE: 15.0000 mL | Freq: Once | OROMUCOSAL | Status: AC

## 2021-12-26 MED ORDER — PROPOFOL 500 MG/50ML IV EMUL
INTRAVENOUS | Status: DC | PRN
  Administered 2021-12-26: 145 ug/kg/min via INTRAVENOUS

## 2021-12-26 MED ORDER — CEFAZOLIN SODIUM-DEXTROSE 2-4 GM/100ML-% IV SOLN
INTRAVENOUS | Status: AC
  Filled 2021-12-26: qty 100

## 2021-12-26 MED ORDER — INSULIN ASPART 100 UNIT/ML IJ SOLN
3.0000 [IU] | Freq: Once | INTRAMUSCULAR | Status: AC
  Administered 2021-12-26: 3 [IU] via SUBCUTANEOUS

## 2021-12-26 MED ORDER — BUPIVACAINE HCL (PF) 0.5 % IJ SOLN
INTRAMUSCULAR | Status: AC
  Filled 2021-12-26: qty 30

## 2021-12-26 MED ORDER — PROPOFOL 10 MG/ML IV BOLUS
INTRAVENOUS | Status: DC | PRN
  Administered 2021-12-26: 20 mg via INTRAVENOUS

## 2021-12-26 MED ORDER — INSULIN ASPART 100 UNIT/ML IJ SOLN
INTRAMUSCULAR | Status: AC
  Filled 2021-12-26: qty 1

## 2021-12-26 MED ORDER — GLYCOPYRROLATE 0.2 MG/ML IJ SOLN
INTRAMUSCULAR | Status: DC | PRN
  Administered 2021-12-26: .2 mg via INTRAVENOUS

## 2021-12-26 MED ORDER — TRAMADOL HCL 50 MG PO TABS: 50.0000 mg | ORAL_TABLET | Freq: Four times a day (QID) | ORAL | 0 refills | Status: DC | PRN

## 2021-12-26 MED ORDER — CHLORHEXIDINE GLUCONATE 0.12 % MT SOLN
OROMUCOSAL | Status: AC
  Administered 2021-12-26: 15 mL via OROMUCOSAL
  Filled 2021-12-26: qty 15

## 2021-12-26 MED ORDER — CEPHALEXIN 500 MG PO CAPS: 500.0000 mg | ORAL_CAPSULE | Freq: Four times a day (QID) | ORAL | 0 refills | Status: DC

## 2021-12-26 MED ORDER — ONDANSETRON HCL 4 MG/2ML IJ SOLN
INTRAMUSCULAR | Status: DC | PRN
  Administered 2021-12-26: 4 mg via INTRAVENOUS

## 2021-12-26 MED ORDER — OXYCODONE HCL 5 MG/5ML PO SOLN: 5.0000 mg | Freq: Once | ORAL | Status: DC | PRN

## 2021-12-26 MED ORDER — LIDOCAINE HCL (PF) 1 % IJ SOLN
INTRAMUSCULAR | Status: AC
  Filled 2021-12-26: qty 30

## 2021-12-26 MED ORDER — LIDOCAINE HCL (CARDIAC) PF 100 MG/5ML IV SOSY
PREFILLED_SYRINGE | INTRAVENOUS | Status: DC | PRN
  Administered 2021-12-26: 60 mg via INTRATRACHEAL

## 2021-12-26 MED ORDER — FENTANYL CITRATE (PF) 100 MCG/2ML IJ SOLN: 25.0000 ug | INTRAMUSCULAR | Status: DC | PRN

## 2021-12-26 MED ORDER — FENTANYL CITRATE (PF) 100 MCG/2ML IJ SOLN
INTRAMUSCULAR | Status: AC
  Filled 2021-12-26: qty 2

## 2021-12-26 MED ORDER — FAMOTIDINE 20 MG PO TABS: 20.0000 mg | ORAL_TABLET | Freq: Once | ORAL | Status: AC

## 2021-12-26 MED ORDER — CEFAZOLIN SODIUM-DEXTROSE 2-4 GM/100ML-% IV SOLN
2.0000 g | INTRAVENOUS | Status: AC
Start: 2021-12-26 — End: 2021-12-26
  Administered 2021-12-26: 2 g via INTRAVENOUS

## 2021-12-26 MED ORDER — ACETAMINOPHEN 10 MG/ML IV SOLN
INTRAVENOUS | Status: DC | PRN
  Administered 2021-12-26: 1000 mg via INTRAVENOUS

## 2021-12-26 MED ORDER — 0.9 % SODIUM CHLORIDE (POUR BTL) OPTIME
TOPICAL | Status: DC | PRN
  Administered 2021-12-26: 200 mL

## 2021-12-26 MED ORDER — VANCOMYCIN HCL 1000 MG IV SOLR
INTRAVENOUS | Status: AC
  Filled 2021-12-26: qty 20

## 2021-12-26 SURGICAL SUPPLY — 47 items
BLADE SURG 15 STRL LF DISP TIS (BLADE) ×1 IMPLANT
BLADE SURG 15 STRL SS (BLADE) ×2
BNDG ELASTIC 4X5.8 VLCR STR LF (GAUZE/BANDAGES/DRESSINGS) ×3 IMPLANT
BNDG GZE 12X3 1 PLY HI ABS (GAUZE/BANDAGES/DRESSINGS) ×1
BNDG STRETCH GAUZE 3IN X12FT (GAUZE/BANDAGES/DRESSINGS) ×2 IMPLANT
BOOT STEPPER DURA SM (SOFTGOODS) ×1 IMPLANT
DRSG EMULSION OIL 3X8 NADH (GAUZE/BANDAGES/DRESSINGS) ×1 IMPLANT
DRSG VAC ATS MED SENSATRAC (GAUZE/BANDAGES/DRESSINGS) ×1 IMPLANT
ELECT REM PT RETURN 9FT ADLT (ELECTROSURGICAL) ×2
ELECTRODE REM PT RTRN 9FT ADLT (ELECTROSURGICAL) ×1 IMPLANT
GAUZE SPONGE 4X4 12PLY STRL (GAUZE/BANDAGES/DRESSINGS) ×3 IMPLANT
GLOVE BIO SURGEON STRL SZ7 (GLOVE) ×4 IMPLANT
GLOVE SURG SYN 7.5  E (GLOVE) ×2
GLOVE SURG SYN 7.5 E (GLOVE) ×2 IMPLANT
GLOVE SURG SYN 7.5 PF PI (GLOVE) ×2 IMPLANT
GLOVE SURG UNDER POLY LF SZ7.5 (GLOVE) ×2 IMPLANT
GOWN STRL REUS W/ TWL XL LVL3 (GOWN DISPOSABLE) ×2 IMPLANT
GOWN STRL REUS W/TWL XL LVL3 (GOWN DISPOSABLE) ×4
GRAFT MYRIAD 3 LAYER 7X10 (Graft) ×1 IMPLANT
IV NS 1000ML (IV SOLUTION) ×2
IV NS 1000ML BAXH (IV SOLUTION) ×1 IMPLANT
KIT DRSG VAC SLVR GRANUFM (MISCELLANEOUS) ×2 IMPLANT
KIT PREVENA INCISION MGT 13 (CANNISTER) ×1 IMPLANT
KIT TURNOVER KIT A (KITS) ×2 IMPLANT
LABEL OR SOLS (LABEL) ×2 IMPLANT
MANIFOLD NEPTUNE II (INSTRUMENTS) ×2 IMPLANT
NDL FILTER BLUNT 18X1 1/2 (NEEDLE) ×1 IMPLANT
NDL HYPO 25X1 1.5 SAFETY (NEEDLE) ×2 IMPLANT
NEEDLE FILTER BLUNT 18X 1/2SAF (NEEDLE) ×1
NEEDLE FILTER BLUNT 18X1 1/2 (NEEDLE) ×1 IMPLANT
NEEDLE HYPO 25X1 1.5 SAFETY (NEEDLE) ×4 IMPLANT
NS IRRIG 500ML POUR BTL (IV SOLUTION) ×2 IMPLANT
PACK EXTREMITY ARMC (MISCELLANEOUS) ×2 IMPLANT
PAD ABD DERMACEA PRESS 5X9 (GAUZE/BANDAGES/DRESSINGS) ×2 IMPLANT
SOL PREP PVP 2OZ (MISCELLANEOUS) ×4
SOLUTION PREP PVP 2OZ (MISCELLANEOUS) ×1 IMPLANT
STAPLER SKIN PROX 35W (STAPLE) ×1 IMPLANT
SUT ETHILON 2 0 FS 18 (SUTURE) ×2 IMPLANT
SUT ETHILON 4-0 (SUTURE)
SUT ETHILON 4-0 FS2 18XMFL BLK (SUTURE)
SUT VIC AB 3-0 SH 27 (SUTURE)
SUT VIC AB 3-0 SH 27X BRD (SUTURE) ×1 IMPLANT
SUT VIC AB 4-0 FS2 27 (SUTURE) ×1 IMPLANT
SUTURE ETHLN 4-0 FS2 18XMF BLK (SUTURE) ×1 IMPLANT
SYR 10ML LL (SYRINGE) ×2 IMPLANT
SYR 3ML LL SCALE MARK (SYRINGE) ×2 IMPLANT
UNDERPAD 30X36 HEAVY ABSORB (UNDERPADS AND DIAPERS) ×2 IMPLANT

## 2021-12-26 NOTE — Anesthesia Preprocedure Evaluation (Addendum)
Anesthesia Evaluation  Patient identified by MRN, date of birth, ID band Patient awake    Reviewed: Allergy & Precautions, NPO status , Patient's Chart, lab work & pertinent test results  Airway Mallampati: IV  TM Distance: >3 FB Neck ROM: full    Dental  (+) Chipped   Pulmonary neg pulmonary ROS, former smoker,    Pulmonary exam normal        Cardiovascular hypertension, + CAD  Normal cardiovascular exam     Neuro/Psych negative neurological ROS  negative psych ROS   GI/Hepatic negative GI ROS, Neg liver ROS,   Endo/Other  negative endocrine ROSdiabetes  Renal/GU Renal disease     Musculoskeletal   Abdominal   Peds  Hematology negative hematology ROS (+)   Anesthesia Other Findings Past Medical History: No date: Chronic kidney disease No date: Diabetes mellitus No date: Hyperlipidemia No date: Hypertension  Past Surgical History: 04/10/2021: AMPUTATION TOE     Comment:  Procedure: AMPUTATION 2nd TOE, left foot;  Surgeon:               Edrick Kins, DPM;  Location: [facility];  Service:               Podiatry;; No date: CATARACT EXTRACTION No date: eye surgery r; Right     Comment:  lense placed 04/18/2021: INCISION AND DRAINAGE; Left     Comment:  Procedure: INCISION AND DRAINAGE;  Surgeon: [name], DPM;  Location: [facility];  Service: Podiatry;                Laterality: Left; 04/10/2021: INCISION AND DRAINAGE OF WOUND; Left     Comment:  Procedure: IRRIGATION AND DEBRIDEMENT WOUND;  Surgeon:               Edrick Kins, DPM;  Location: [facility];  Service:               Podiatry;  Laterality: Left; 04/13/2021: PERIPHERAL VASCULAR BALLOON ANGIOPLASTY; Left     Comment:  Procedure: PERIPHERAL VASCULAR BALLOON ANGIOPLASTY;                Surgeon: Katha Cabal, MD;  Location: [place]              [facility];  Service: Cardiovascular;  Laterality: Left; 04/12/2021:  TRANSMETATARSAL AMPUTATION; Left     Comment:  Procedure: TRANSMETATARSAL AMPUTATION;  Surgeon: Felipa Furnace, DPM;  Location: [facility];  Service: Podiatry;                Laterality: Left;  BMI    Body Mass Index: 31.95 kg/m      Reproductive/Obstetrics negative OB ROS                             Anesthesia Physical Anesthesia Plan  ASA: 2  Anesthesia Plan: MAC   Post-op Pain Management:    Induction:   PONV Risk Score and Plan: Ondansetron, Dexamethasone, Midazolam and Treatment may vary due to age or medical condition  Airway Management Planned:   Additional Equipment:   Intra-op Plan:   Post-operative Plan:   Informed Consent: I have reviewed the patients History and Physical, chart, labs and discussed the procedure including the risks, benefits and alternatives for the  proposed anesthesia with the patient or authorized representative who has indicated his/her understanding and acceptance.     Dental Advisory Given  Plan Discussed with: Anesthesiologist, CRNA and Surgeon  Anesthesia Plan Comments:        Anesthesia Quick Evaluation

## 2021-12-26 NOTE — Brief Op Note (Signed)
12/26/2021  12:43 PM  PATIENT:  Chelsea Barton  61 y.o. female  PRE-OPERATIVE DIAGNOSIS:  diabetic ulcer left foot  POST-OPERATIVE DIAGNOSIS:  diabetic ulcer left foot  PROCEDURE:  Procedure(s): IRRIGATION AND DEBRIDEMENT EXTREMITY-left foot (Left) GRAFT APPLICATION-left foot (Left)  SURGEON:  Surgeon(s) and Role:    * Anner Baity, Stephan Minister, DPM - Primary   ASSISTANTS: none   ANESTHESIA:   MAC  EBL:  5 mL   BLOOD ADMINISTERED:none  DRAINS: none   LOCAL MEDICATIONS USED:  MARCAINE   , BUPIVICAINE , and Amount: 6 ml each  SPECIMEN:  No Specimen  DISPOSITION OF SPECIMEN:  N/A  COUNTS:  YES  TOURNIQUET:  * No tourniquets in log *  DICTATION: .Note written in EPIC  PLAN OF CARE: Discharge to home after PACU  PATIENT DISPOSITION:  PACU - hemodynamically stable.   Delay start of Pharmacological VTE agent (>24hrs) due to surgical blood loss or risk of bleeding: no  WBAT in surgical shoe

## 2021-12-26 NOTE — Discharge Instructions (Addendum)
Post-Surgery Instructions  1. If you are recuperating from surgery anywhere other than home, please be sure to leave [name] a number where you can be reached. 2. Go directly home and rest. 3. The keep operated foot (or feet) elevated six inches above the hip when sitting or lying down. 4. Support the elevated foot and leg with pillows under the calf. DO NOT PLACE PILLOWS UNDER THE KNEE. 5. DO NOT REMOVE or get your bandages wet. This will increase your chances of getting an infection. 6. Wear your surgical shoe at all times when you are up. 7. A limited amount of pain and swelling may occur. The skin may take on a bruised appearance. This is no cause for alarm. 8. For slight pain and swelling, apply an ice pack directly over the bandage for 15 minutes every hour. Continue icing until seen in the office. DO NOT apply any form of heat to the area. 9. Have prescription(s) filled immediately and take as directed. 10. Drink lots of liquids, water, and juice. 11. CALL THE OFFICE IMMEDIATELY IF: a. Bleeding continues b. Pain increases and/or does not respond to medication c. Bandage or cast appears too tight d. Any liquids (water, coffee, etc.) have spilled on your bandages. e. Tripping, falling, or stubbing the surgical foot f. If your temperature rises above 101 g. If you have ANY questions at all 12. Please use the crutches, knee scooter, or walker you have prescribed, rented, or purchased. If you are non-weight bearing DO NOT put weight on the operated foot for _________ days. If you are weight-bearing, follow your physician's instructions. You may put weight on the heel in the boot, but rest as much as possible 13. Special Instructions: Do not change the dressing or remove the device. If the device beeps or has an alarm consistently please call me   14. Your next appointment is: 01/02/2022 3:15 PM  If you need to reach the nurse for any reason, please call: [place]: [phone]  [phone] [place]: [phone] [place]: [phone]) [phone]   AMBULATORY SURGERY  DISCHARGE INSTRUCTIONS   The drugs that you were given will stay in your system until tomorrow so for the next 24 hours you should not:  Drive an automobile Make any legal decisions Drink any alcoholic beverage   You may resume regular meals tomorrow.  Today it is better to start with liquids and gradually work up to solid foods.  You may eat anything you prefer, but it is better to start with liquids, then soup and crackers, and gradually work up to solid foods.   Please notify your doctor immediately if you have any unusual bleeding, trouble breathing, redness and pain at the surgery site, drainage, fever, or pain not relieved by medication.    Additional Instructions:  Please contact your physician with any problems or Same Day Surgery at [phone], Monday through Friday 6 am to 4 pm, or [place] at [facility] number at [phone].

## 2021-12-26 NOTE — Progress Notes (Signed)
Blood glucose in pacu @ 1245:  210  per anesthesia Dr. Barbra Sarks ok for discharge home.

## 2021-12-26 NOTE — Transfer of Care (Signed)
Immediate Anesthesia Transfer of Care Note  Patient: Chelsea Barton  Procedure(s) Performed: IRRIGATION AND DEBRIDEMENT EXTREMITY-left foot (Left: Foot) GRAFT APPLICATION-left foot (Left: Foot)  Patient Location: PACU  Anesthesia Type:General  Level of Consciousness: drowsy and patient cooperative  Airway & Oxygen Therapy: Patient Spontanous Breathing and Patient connected to face mask oxygen  Post-op Assessment: Report given to RN and Post -op Vital signs reviewed and stable  Post vital signs: Reviewed and stable  Last Vitals:  Vitals Value Taken Time  BP 127/70 12/26/21 1246  Temp    Pulse 66 12/26/21 1254  Resp 20 12/26/21 1254  SpO2 100 % 12/26/21 1254  Vitals shown include unvalidated device data.  Last Pain:  Vitals:   12/26/21 1004  TempSrc: Oral  PainSc: 0-No pain         Complications: No notable events documented.

## 2021-12-26 NOTE — Interval H&P Note (Signed)
History and Physical Interval Note:  12/26/2021 11:58 AM  Chelsea Barton  has presented today for surgery, with the diagnosis of diabetic ulcer left foot.  The various methods of treatment have been discussed with the patient and family. After consideration of risks, benefits and other options for treatment, the patient has consented to  Procedure(s): IRRIGATION AND DEBRIDEMENT EXTREMITY-left foot (Left) GRAFT APPLICATION-left foot (Left) as a surgical intervention.  The patient's history has been reviewed, patient examined, no change in status, stable for surgery.  I have reviewed the patient's chart and labs.  Questions were answered to the patient's satisfaction.     [name]

## 2021-12-26 NOTE — Anesthesia Postprocedure Evaluation (Signed)
Anesthesia Post Note  Patient: Chelsea Barton  Procedure(s) Performed: IRRIGATION AND DEBRIDEMENT EXTREMITY-left foot (Left: Foot) GRAFT APPLICATION-left foot (Left: Foot)  Patient location during evaluation: PACU Anesthesia Type: MAC Level of consciousness: awake and alert Pain management: pain level controlled Vital Signs Assessment: post-procedure vital signs reviewed and stable Respiratory status: spontaneous breathing, nonlabored ventilation, respiratory function stable and patient connected to nasal cannula oxygen Cardiovascular status: blood pressure returned to baseline and stable Postop Assessment: no apparent nausea or vomiting Anesthetic complications: no   No notable events documented.   Last Vitals:  Vitals:   12/26/21 1004  BP: (!) 108/58  Pulse: 73  Resp: 16  Temp: 36.7 C  SpO2: 100%    Last Pain:  Vitals:   12/26/21 1004  TempSrc: Oral  PainSc: 0-No pain                 Dimas Millin

## 2021-12-26 NOTE — Progress Notes (Signed)
Pt CBG: 256. Dr. Barbra Sarks notified. Acknowledged. Orders received. See [facility]

## 2021-12-26 NOTE — Op Note (Signed)
Patient Name: Chelsea Barton DOB: 1960-09-08  MRN: [id number]   Date of Service: 12/26/2021  Surgeon: Dr. Lanae Crumbly, DPM Assistants: None Pre-operative Diagnosis:  Nonhealing wound of left foot Peripheral arterial disease Type 2 diabetes mellitus uncontrolled Post-operative Diagnosis:  Nonhealing wound of left foot Peripheral arterial disease Type 2 diabetes mellitus uncontrolled Procedures:  1) preparation of wound bed for skin substitute left foot 15 cm  2) application of skin substitute to left foot  3) application negative pressure wound therapy Pathology/Specimens: * No specimens in log * Anesthesia: Monitored anesthesia care with ankle block Hemostasis: * No tourniquets in log * Estimated Blood Loss: 5 mL Materials:  Implant Name Type Inv. Item Serial No. Manufacturer Lot No. LRB No. Used Action  GRAFT MYRIAD 3 LAYER 7X10 - [id number] Graft GRAFT MYRIAD 3 LAYER 7X10 [id number] [employer] [id number] Left 1 Implanted   Medications: 6 cc each of 2% lidocaine and 0.5% Marcaine plain Complications: No complication noted  Indications for Procedure:  This is a 61 y.o. female with a history of PAD, type 2 diabetes uncontrolled who previously had a severe left foot infection necessitating midfoot amputation.  She suffered a dehiscence of the wound with nonhealing ulceration.  She has undergone several months of local wound care in the outpatient setting and the wound has stalled and is no longer having progressive healing.  Debridement and application of a skin substitute was recommended.   Procedure in Detail: Patient was identified in pre-operative holding area. Formal consent was signed and the left lower extremity was marked. Patient was brought back to the operating room. Anesthesia was induced. The extremity was prepped and draped in the usual sterile fashion. Timeout was taken to confirm patient name, laterality, and procedure prior to incision.   Attention  was then directed to the left foot where the open midfoot amputation measuring 5.0 x 3.0 cm was present.  The wound was debrided of all nonviable skin and subcutaneous tissue in an excisional manner using a sharp scalpel and curette.  Once thorough preparation of all eschar and hyperkeratotic tissue was removed, the wound bed was thoroughly irrigated with normal sterile saline.  Hemostasis was achieved with the Bovie.  A Myriad Matrix sheet was then selected and inset into the wound bed.  It was secured with skin staples and cut to fit.  It was then hydrated with surgical lube and Adaptic was placed over this and this was also secured to the skin using skin staples.  Additional surgical lube was applied.  A Prevena negative pressure wound therapy device with a standard black sponge was then applied.  An Ace wrap under no compression was then applied. Patient tolerated the procedure well.   Disposition: Following a period of post-operative monitoring, patient will be transferred to home.

## 2021-12-27 ENCOUNTER — Encounter: Payer: Self-pay | Admitting: Podiatry

## 2022-01-02 ENCOUNTER — Ambulatory Visit (INDEPENDENT_AMBULATORY_CARE_PROVIDER_SITE_OTHER): Payer: Medicaid Other | Admitting: Podiatry

## 2022-01-02 DIAGNOSIS — E13621 Other specified diabetes mellitus with foot ulcer: Secondary | ICD-10-CM

## 2022-01-02 DIAGNOSIS — L97422 Non-pressure chronic ulcer of left heel and midfoot with fat layer exposed: Secondary | ICD-10-CM

## 2022-01-07 NOTE — Progress Notes (Signed)
  Subjective:  Patient ID: Chelsea Barton, female    DOB: 1961/02/24,  MRN: [id number]  Chief Complaint  Patient presents with   Diabetic Ulcer   Routine Post Op     POV #1 DOS 12/26/2021 WOUND DEBRIDMENT & GRAFT APPLICATION LT FOOT     61 y.o. female returns for post-op check.  Overall doing well the vacuum unit worked until today  Review of Systems: Negative except as noted in the HPI. Denies N/V/F/Ch.   Objective:  There were no vitals filed for this visit. There is no height or weight on file to calculate BMI. Constitutional Well developed. Well nourished.  Vascular Foot warm and well perfused. Capillary refill normal to all digits.  Calf is soft and supple, no posterior calf or knee pain, negative Homans' sign  Neurologic Normal speech. Oriented to person, place, and time. Epicritic sensation to light touch grossly present bilaterally.  Dermatologic Skin healing well without signs of infection.  Graft is intact with staples intact  Orthopedic: She has no tenderness to palpation noted about the surgical site.   Assessment:   1. 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  S/p foot surgery left -Progressing as expected post-operatively. -Overall doing well.  Continue minimal WB in the cam boot.  She will change the dressing every other day and apply hydrogel if it shows any signs of desiccation.  I will see her back in 1 week for follow-up.  The foot was redressed with a sterile bandage and nonadherent dressings  Return in about 1 week (around 01/09/2022) for wound care.

## 2022-01-09 ENCOUNTER — Ambulatory Visit (INDEPENDENT_AMBULATORY_CARE_PROVIDER_SITE_OTHER): Payer: Medicaid Other | Admitting: Podiatry

## 2022-01-09 DIAGNOSIS — L97422 Non-pressure chronic ulcer of left heel and midfoot with fat layer exposed: Secondary | ICD-10-CM

## 2022-01-09 DIAGNOSIS — E13621 Other specified diabetes mellitus with foot ulcer: Secondary | ICD-10-CM

## 2022-01-10 NOTE — Progress Notes (Signed)
  Subjective:  Patient ID: Chelsea Barton, female    DOB: 03-14-1961,  MRN: [id number]  Chief Complaint  Patient presents with   Routine Post Op    POV #2 DOS 12/26/21 Debridement and wound graft placement - Diabetic ulcer of the left midfoot, 1 week follow up     61 y.o. female returns for post-op check.  Overall doing well  Review of Systems: Negative except as noted in the HPI. Denies N/V/F/Ch.   Objective:  There were no vitals filed for this visit. There is no height or weight on file to calculate BMI. Constitutional Well developed. Well nourished.  Vascular Foot warm and well perfused. Capillary refill normal to all digits.  Calf is soft and supple, no posterior calf or knee pain, negative Homans' sign  Neurologic Normal speech. Oriented to person, place, and time. Epicritic sensation to light touch grossly present bilaterally.  Dermatologic Graft has some desiccation, central 20% has some healing to remain, remainder has already healed  Orthopedic: She has no tenderness to palpation noted about the surgical site.   Assessment:   1. 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  S/p foot surgery left -Progressing as expected post-operatively. -Overall doing well.  Graft has excellent take only 20% healing remaining.  Most staples removed today.  Continue hydration daily with hydrogel at home.  I will see her back in 2 weeks for follow-up  Return in about 2 weeks (around 01/23/2022) for diabetic ulcer/post op.

## 2022-01-11 DIAGNOSIS — R531 Weakness: Secondary | ICD-10-CM | POA: Diagnosis not present

## 2022-01-16 ENCOUNTER — Encounter: Payer: Medicaid Other | Admitting: Podiatry

## 2022-01-23 ENCOUNTER — Ambulatory Visit (INDEPENDENT_AMBULATORY_CARE_PROVIDER_SITE_OTHER): Payer: Medicaid Other | Admitting: Podiatry

## 2022-01-23 DIAGNOSIS — L97422 Non-pressure chronic ulcer of left heel and midfoot with fat layer exposed: Secondary | ICD-10-CM | POA: Diagnosis not present

## 2022-01-23 DIAGNOSIS — E13621 Other specified diabetes mellitus with foot ulcer: Secondary | ICD-10-CM

## 2022-01-23 NOTE — Progress Notes (Signed)
  Subjective:  Patient ID: Chelsea Barton, female    DOB: 28-Feb-1961,  MRN: [id number]  Chief Complaint  Patient presents with   Diabetic Ulcer      POV #2 DOS 12/26/2021 WOUND DEBRIDMENT & GRAFT APPLICATION LT FOOT      61 y.o. female returns for post-op check.  Overall doing well  Review of Systems: Negative except as noted in the HPI. Denies N/V/F/Ch.   Objective:  There were no vitals filed for this visit. There is no height or weight on file to calculate BMI. Constitutional Well developed. Well nourished.  Vascular Foot warm and well perfused. Capillary refill normal to all digits.  Calf is soft and supple, no posterior calf or knee pain, negative Homans' sign  Neurologic Normal speech. Oriented to person, place, and time. Epicritic sensation to light touch grossly present bilaterally.  Dermatologic Persistent central ulceration measuring 13 mm x 2 mm x 3 mm.  Serous drainage.  No signs of infection.  Granular wound bed.  Surrounding hyperkeratosis  Orthopedic: She has no tenderness to palpation noted about the surgical site.      Assessment:   1. 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  S/p foot surgery left -Overall continues to improve.  She will continue to dress at home daily.  Debrided the central ulceration and surrounding hyperkeratotic tissue to the subcutaneous level utilizing a sharp scalpel in an excisional manner.  Postdebridement measurements are noted above.  There is no sign infection currently  Return in about 3 weeks (around 02/13/2022) for wound care.

## 2022-02-06 ENCOUNTER — Encounter: Payer: Self-pay | Admitting: Podiatry

## 2022-02-06 ENCOUNTER — Ambulatory Visit: Payer: Medicaid Other | Admitting: Podiatry

## 2022-02-06 DIAGNOSIS — E13621 Other specified diabetes mellitus with foot ulcer: Secondary | ICD-10-CM

## 2022-02-06 DIAGNOSIS — L97422 Non-pressure chronic ulcer of left heel and midfoot with fat layer exposed: Secondary | ICD-10-CM | POA: Diagnosis not present

## 2022-02-06 MED ORDER — GENTAMICIN SULFATE 0.1 % EX OINT: 1.0000 | TOPICAL_OINTMENT | Freq: Every day | CUTANEOUS | 0 refills | Status: DC

## 2022-02-11 DIAGNOSIS — R531 Weakness: Secondary | ICD-10-CM | POA: Diagnosis not present

## 2022-02-11 NOTE — Progress Notes (Signed)
  Subjective:  Patient ID: DAI APEL, female    DOB: [birth year],  MRN: [id number]  Chief Complaint  Patient presents with   Routine Post Op    "It's bleeding today."     61 y.o. female returns for post-op check.  Feels like it has opened up a little bit more  Review of Systems: Negative except as noted in the HPI. Denies N/V/F/Ch.   Objective:  There were no vitals filed for this visit. There is no height or weight on file to calculate BMI. Constitutional Well developed. Well nourished.  Vascular Foot warm and well perfused. Capillary refill normal to all digits.  Calf is soft and supple, no posterior calf or knee pain, negative Homans' sign  Neurologic Normal speech. Oriented to person, place, and time. Epicritic sensation to light touch grossly present bilaterally.  Dermatologic Persistent central ulceration measuring 2.5 x 1.6 x 0.4 cm.  Serous drainage.  No signs of infection.  Granular wound bed.  Surrounding hyperkeratosis  Orthopedic: She has no tenderness to palpation noted about the surgical site.       Assessment:   1. 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  S/p foot surgery left -Wound is enlarging some.  Debridement performed as noted below.  Continue home dressing changes with gentamicin ointment.  Debrided the central ulceration and surrounding hyperkeratotic tissue to the subcutaneous level utilizing a sharp scalpel in an excisional manner.  Postdebridement measurements are noted above.  There is no sign infection currently  Return in about 4 weeks (around 03/06/2022) for wound care.

## 2022-02-27 ENCOUNTER — Ambulatory Visit: Payer: Medicaid Other | Admitting: Podiatry

## 2022-02-27 DIAGNOSIS — L97422 Non-pressure chronic ulcer of left heel and midfoot with fat layer exposed: Secondary | ICD-10-CM | POA: Diagnosis not present

## 2022-02-27 DIAGNOSIS — E13621 Other specified diabetes mellitus with foot ulcer: Secondary | ICD-10-CM | POA: Diagnosis not present

## 2022-02-27 NOTE — Progress Notes (Signed)
  Subjective:  Patient ID: Chelsea Barton, female    DOB: 12-17-1960,  MRN: [id number]  Chief Complaint  Patient presents with   Diabetic Ulcer    Follow up left midfoot      61 y.o. female returns for post-op check.  Feels like it has opened up a little bit more  Review of Systems: Negative except as noted in the HPI. Denies N/V/F/Ch.   Objective:  There were no vitals filed for this visit. There is no height or weight on file to calculate BMI. Constitutional Well developed. Well nourished.  Vascular Foot warm and well perfused. Capillary refill normal to all digits.  Calf is soft and supple, no posterior calf or knee pain, negative Homans' sign  Neurologic Normal speech. Oriented to person, place, and time. Epicritic sensation to light touch grossly present bilaterally.  Dermatologic Persistent central ulceration limited to breakdown of skin, no signs of infection  Orthopedic: She has no tenderness to palpation noted about the surgical site.       Assessment:   1. 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  S/p foot surgery left -Debridement of the nonviable hyperkeratotic tissue was performed today.  At this point I think it might be best if we try to leave this open to air more and dry up some of the drainage with Betadine.  I will see her back in 1 month for follow-up.  Advised on signs and symptoms of worsening infection or wound enlargement.  Discussed with her it is possible it may not fully heal at this point and needs palliative wound care  Return in about 1 month (around 03/29/2022) for wound care.

## 2022-03-13 ENCOUNTER — Ambulatory Visit: Payer: Medicaid Other | Admitting: Internal Medicine

## 2022-03-14 DIAGNOSIS — R531 Weakness: Secondary | ICD-10-CM | POA: Diagnosis not present

## 2022-03-14 DIAGNOSIS — E113553 Type 2 diabetes mellitus with stable proliferative diabetic retinopathy, bilateral: Secondary | ICD-10-CM | POA: Diagnosis not present

## 2022-03-21 ENCOUNTER — Encounter (INDEPENDENT_AMBULATORY_CARE_PROVIDER_SITE_OTHER): Payer: Medicaid Other

## 2022-03-21 ENCOUNTER — Ambulatory Visit (INDEPENDENT_AMBULATORY_CARE_PROVIDER_SITE_OTHER): Payer: Medicaid Other | Admitting: Nurse Practitioner

## 2022-03-27 ENCOUNTER — Other Ambulatory Visit (INDEPENDENT_AMBULATORY_CARE_PROVIDER_SITE_OTHER): Payer: Self-pay | Admitting: Nurse Practitioner

## 2022-03-27 DIAGNOSIS — I739 Peripheral vascular disease, unspecified: Secondary | ICD-10-CM

## 2022-03-28 ENCOUNTER — Ambulatory Visit (INDEPENDENT_AMBULATORY_CARE_PROVIDER_SITE_OTHER): Payer: Medicaid Other

## 2022-03-28 ENCOUNTER — Encounter (INDEPENDENT_AMBULATORY_CARE_PROVIDER_SITE_OTHER): Payer: Self-pay | Admitting: Nurse Practitioner

## 2022-03-28 ENCOUNTER — Ambulatory Visit (INDEPENDENT_AMBULATORY_CARE_PROVIDER_SITE_OTHER): Payer: Medicaid Other | Admitting: Nurse Practitioner

## 2022-03-28 VITALS — BP 131/64 | HR 69 | Resp 17 | Ht 65.0 in | Wt 206.0 lb

## 2022-03-28 DIAGNOSIS — I739 Peripheral vascular disease, unspecified: Secondary | ICD-10-CM

## 2022-03-28 DIAGNOSIS — Z9889 Other specified postprocedural states: Secondary | ICD-10-CM

## 2022-03-28 DIAGNOSIS — E785 Hyperlipidemia, unspecified: Secondary | ICD-10-CM

## 2022-03-28 DIAGNOSIS — I10 Essential (primary) hypertension: Secondary | ICD-10-CM

## 2022-03-31 ENCOUNTER — Encounter (INDEPENDENT_AMBULATORY_CARE_PROVIDER_SITE_OTHER): Payer: Self-pay | Admitting: Nurse Practitioner

## 2022-03-31 NOTE — Progress Notes (Signed)
Subjective:    Patient ID: Chelsea Barton, female    DOB: 11-27-1960, 61 y.o.   MRN: [id number] No chief complaint on file.   The patient returns to the office for followup and review of the noninvasive studies.   There have been no interval changes in lower extremity symptoms. No interval shortening of the patient's claudication distance or development of rest pain symptoms. No new ulcers or wounds have occurred since the last visit.  There have been no significant changes to the patient's overall health care.  The patient denies amaurosis fugax or recent TIA symptoms. There are no documented recent neurological changes noted. There is no history of DVT, PE or superficial thrombophlebitis. The patient denies recent episodes of angina or shortness of breath.   ABI Rt=0.87 and Lt=0.86  (previous ABI's Rt=0.93 and Lt=1.10) Duplex ultrasound of the bilateral tibial arteries reveals monophasic waveforms with biphasic in the right posterior tibial artery.  There is a 50 to 74% stenosis noted at the mid SFA stent    Review of Systems  All other systems reviewed and are negative.      Objective:   Physical Exam Vitals reviewed.  HENT:     Head: Normocephalic.  Cardiovascular:     Rate and Rhythm: Normal rate.     Pulses:          Dorsalis pedis pulses are detected w/ Doppler on the right side and detected w/ Doppler on the left side.       Posterior tibial pulses are detected w/ Doppler on the right side and detected w/ Doppler on the left side.  Pulmonary:     Effort: Pulmonary effort is normal.  Skin:    General: Skin is warm and dry.  Neurological:     Mental Status: She is alert and oriented to person, place, and time.  Psychiatric:        Mood and Affect: Mood normal.        Behavior: Behavior normal.        Thought Content: Thought content normal.        Judgment: Judgment normal.     BP 131/64 (BP Location: Left Arm)   Pulse 69   Resp 17   Ht 5\' 5"  (1.651 m)    Wt 206 lb (93.4 kg)   BMI 34.28 kg/m   Past Medical History:  Diagnosis Date   Chronic kidney disease    Diabetes mellitus    Hyperlipidemia    Hypertension     Social History   Socioeconomic History   Marital status: Single    Spouse name: Not on file   Number of children: Not on file   Years of education: Not on file   Highest education level: Not on file  Occupational History   Not on file  Tobacco Use   Smoking status: Former    Types: Cigarettes    Quit date: 06/18/2005    Years since quitting: 16.7   Smokeless tobacco: Never  Vaping Use   Vaping Use: Never used  Substance and Sexual Activity   Alcohol use: No   Drug use: No   Sexual activity: Not on file  Other Topics Concern   Not on file  Social History Narrative   Has a great niece that she keeps a lot, 24 yo, '[name]'.  Stays weekends with her.       Has 8 great nieces, 3 nieces, 6 nephews   Social Determinants of Health  Financial Resource Strain: Medium Risk (06/14/2021)   Overall Financial Resource Strain (CARDIA)    Difficulty of Paying Living Expenses: Somewhat hard  Food Insecurity: Food Insecurity Present (05/31/2021)   Hunger Vital Sign    Worried About Running Out of Food in the Last Year: Sometimes true    Ran Out of Food in the Last Year: Sometimes true  Transportation Needs: No Transportation Needs (08/09/2021)   PRAPARE - Hydrologist (Medical): No    Lack of Transportation (Non-Medical): No  Physical Activity: Sufficiently Active (07/04/2021)   Exercise Vital Sign    Days of Exercise per Week: 7 days    Minutes of Exercise per Session: 30 min  Stress: Not on file  Social Connections: Not on file  Intimate Partner Violence: Not on file    Past Surgical History:  Procedure Laterality Date   AMPUTATION TOE  04/10/2021   Procedure: AMPUTATION 2nd TOE, left foot;  Surgeon: Edrick Kins, DPM;  Location: [facility];  Service: Podiatry;;   CATARACT EXTRACTION      eye surgery r Right    lense placed   GRAFT APPLICATION Left 08/30/4006   Procedure: GRAFT APPLICATION-left foot;  Surgeon: [name], DPM;  Location: [facility];  Service: Podiatry;  Laterality: Left;   I & D EXTREMITY Left 12/26/2021   Procedure: IRRIGATION AND DEBRIDEMENT EXTREMITY-left foot;  Surgeon: [name], DPM;  Location: [facility];  Service: Podiatry;  Laterality: Left;   INCISION AND DRAINAGE Left 04/18/2021   Procedure: INCISION AND DRAINAGE;  Surgeon: [name], DPM;  Location: [facility];  Service: Podiatry;  Laterality: Left;   INCISION AND DRAINAGE OF WOUND Left 04/10/2021   Procedure: IRRIGATION AND DEBRIDEMENT WOUND;  Surgeon: Edrick Kins, DPM;  Location: [facility];  Service: Podiatry;  Laterality: Left;   PERIPHERAL VASCULAR BALLOON ANGIOPLASTY Left 04/13/2021   Procedure: PERIPHERAL VASCULAR BALLOON ANGIOPLASTY;  Surgeon: Katha Cabal, MD;  Location: [facility];  Service: Cardiovascular;  Laterality: Left;   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   Procedure: TRANSMETATARSAL AMPUTATION;  Surgeon: Felipa Furnace, DPM;  Location: [facility];  Service: Podiatry;  Laterality: Left;    Family History  Problem Relation Age of Onset   Diabetes Sister        55/5 sisters have DM   Breast cancer Neg Hx    Colon cancer Neg Hx     No Known Allergies     Latest Ref Rng & Units 06/14/2021    1:07 PM 05/08/2021   10:21 AM 04/24/2021    6:54 AM  CBC  WBC 3.4 - 10.8 x10E3/uL 9.2  7.7  8.5   Hemoglobin 11.1 - 15.9 g/dL 12.1  11.6  9.7   Hematocrit 34.0 - 46.6 % 39.0  37.7  32.5   Platelets 150 - 450 x10E3/uL 250  265.0  341       CMP     Component Value Date/Time   NA 139 11/27/2021 0932   NA 137 01/20/2014 0000   K 5.1 11/27/2021 0932   CL 102 11/27/2021 0932   CO2 29 11/27/2021 0932   GLUCOSE 223 (H) 11/27/2021 0932   BUN 38 (H) 11/27/2021 0932   BUN 25 (A) 01/20/2014 0000   CREATININE 2.32 (H) 11/27/2021 0932   CALCIUM 9.4  11/27/2021 0932   PROT 7.0 11/27/2021 0932   ALBUMIN 3.7 11/27/2021 0932   AST 19 11/27/2021 0932   ALT 16 11/27/2021 0932  ALKPHOS 75 11/27/2021 0932   BILITOT 0.5 11/27/2021 0932   GFRNONAA 32 (L) 04/24/2021 0654   GFRAA 53 (L) 11/14/2017 1349     No results found.     Assessment & Plan:   1. Peripheral arterial disease with history of revascularization (HCC)  Recommend:  The patient has evidence of atherosclerosis of the lower extremities with claudication.  The patient does not voice lifestyle limiting changes at this point in time.  Noninvasive studies do not suggest clinically significant change.  No invasive studies, angiography or surgery at this time The patient should continue walking and begin a more formal exercise program.  The patient should continue antiplatelet therapy and aggressive treatment of the lipid abnormalities  No changes in the patient's medications at this time  Continued surveillance is indicated as atherosclerosis is likely to progress with time.    The patient will continue follow up with noninvasive studies as ordered.  Patient will follow-up in 6 months  2. Primary hypertension Continue antihypertensive medications as already ordered, these medications have been reviewed and there are no changes at this time.   3. Hyperlipidemia, unspecified hyperlipidemia type Continue statin as ordered and reviewed, no changes at this time    Current Outpatient Medications on File Prior to Visit  Medication Sig Dispense Refill   aspirin 81 MG EC tablet Take 1 tablet (81 mg total) by mouth daily. Swallow whole. 30 tablet 12   carvedilol (COREG) 12.5 MG tablet Take 1 tablet (12.5 mg total) by mouth 2 (two) times daily with a meal. Take one tablet by mouth daily with a meal. 180 tablet 1   cephALEXin (KEFLEX) 500 MG capsule Take 1 capsule (500 mg total) by mouth 4 (four) times daily. 21 capsule 0   Continuous Blood Gluc Sensor (DEXCOM G6 SENSOR) MISC  Inject 1 Device into the skin as directed. Every 9 each 3   Continuous Blood Gluc Transmit (DEXCOM G6 TRANSMITTER) MISC Inject 1 Device into the skin as directed. Every 10 days 1 each 3   dapagliflozin propanediol (FARXIGA) 10 MG TABS tablet Take 1 tablet (10 mg total) by mouth daily. 30 tablet 3   enalapril (VASOTEC) 20 MG tablet Take 2 tablets (40 mg total) by mouth daily. 180 tablet 3   gentamicin ointment (GARAMYCIN) 0.1 % Apply 1 Application topically daily. 30 g 0   glucose blood (ACCU-CHEK AVIVA PLUS) test strip Used to check blood sugar 4x daily. 200 each 12   hydrALAZINE (APRESOLINE) 10 MG tablet TAKE 1/2 TABLET BY MOUTH THREE TIMES DAILY AS NEEDED(IF BLOOD PRESSURE IS GREATER THAN 140/90) 30 tablet 1   insulin glargine (LANTUS) 100 UNIT/ML Solostar Pen Inject 36 Units into the skin daily. (Patient taking differently: Inject 36 Units into the skin at bedtime.) 30 mL 6   Insulin Pen Needle (EASY TOUCH PEN NEEDLES) 31G X 5 MM MISC Check sugar 3-4 daily 100 each 2   Insulin Pen Needle 31G X 5 MM MISC 1 Device by Does not apply route in the morning, at noon, in the evening, and at bedtime. 400 each 2   Multiple Vitamin (MULTIVITAMIN) capsule Take 1 capsule by mouth daily.     NIFEdipine (PROCARDIA-XL/NIFEDICAL-XL) 30 MG 24 hr tablet Take 1 tablet (30 mg total) by mouth daily. 30 tablet 12   NOVOLOG FLEXPEN 100 UNIT/ML FlexPen Max daily 40 units (Patient taking differently: 5-12 Units 3 (three) times daily with meals. Max daily 40 units 5 u morning, 10 u lunch, 12 u supper)  30 mL 6   rosuvastatin (CRESTOR) 10 MG tablet Take 1 tablet (10 mg total) by mouth daily. 90 tablet 3   traMADol (ULTRAM) 50 MG tablet Take 1 tablet (50 mg total) by mouth every 6 (six) hours as needed. 20 tablet 0   Vitamin D, Cholecalciferol, 10 MCG (400 UNIT) CAPS Take 1 capsule by mouth daily.     No current facility-administered medications on file prior to visit.    There are no Patient Instructions on file for this  visit. No follow-ups on file.   Kris Hartmann, NP

## 2022-04-03 ENCOUNTER — Ambulatory Visit (INDEPENDENT_AMBULATORY_CARE_PROVIDER_SITE_OTHER): Payer: Medicaid Other | Admitting: Podiatry

## 2022-04-03 DIAGNOSIS — E13621 Other specified diabetes mellitus with foot ulcer: Secondary | ICD-10-CM | POA: Diagnosis not present

## 2022-04-03 DIAGNOSIS — L97422 Non-pressure chronic ulcer of left heel and midfoot with fat layer exposed: Secondary | ICD-10-CM

## 2022-04-03 DIAGNOSIS — B351 Tinea unguium: Secondary | ICD-10-CM | POA: Diagnosis not present

## 2022-04-03 DIAGNOSIS — M79674 Pain in right toe(s): Secondary | ICD-10-CM

## 2022-04-03 NOTE — Progress Notes (Signed)
  Subjective:  Patient ID: Chelsea Barton, female    DOB: January 19, 1961,  MRN: [id number]  Chief Complaint  Patient presents with   Diabetic Ulcer    Follow up left midfoot     61 y.o. female returns for post-op check.  Says it is doing well she uses the ointment occasionally.  Has not noticed much drainage.  Her nails are elongated and painful and on the right side  Review of Systems: Negative except as noted in the HPI. Denies N/V/F/Ch.   Objective:  There were no vitals filed for this visit. There is no height or weight on file to calculate BMI. Constitutional Well developed. Well nourished.  Vascular Foot warm and well perfused. Capillary refill normal to all digits.  Calf is soft and supple, no posterior calf or knee pain, negative Homans' sign  Neurologic Normal speech. Oriented to person, place, and time. Epicritic sensation to light touch grossly present bilaterally.  Dermatologic Ulceration has callused over the hyperkeratotic tissue.  Thickened elongated brown-yellow dystrophic nails on the right foot  Orthopedic: She has no tenderness to palpation noted about the surgical site.       Assessment:   1. 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  S/p foot surgery left -Doing much better and ulceration has callused over.  Would like to allow this to continue to heal and hopefully does not open up and drain again.  She will return as needed before her next visit if this occurs.  Otherwise I will see her in 1 month for follow-up.  The nails were debrided in length and thickness with a sharp nail nipper to a tolerable level on the right foot x5.  Return in about 1 month (around 05/04/2022) for wound care.

## 2022-04-15 ENCOUNTER — Encounter (INDEPENDENT_AMBULATORY_CARE_PROVIDER_SITE_OTHER): Payer: Self-pay

## 2022-04-26 ENCOUNTER — Other Ambulatory Visit: Payer: Self-pay | Admitting: Family

## 2022-04-26 DIAGNOSIS — Z1231 Encounter for screening mammogram for malignant neoplasm of breast: Secondary | ICD-10-CM

## 2022-05-08 ENCOUNTER — Ambulatory Visit: Payer: Medicaid Other | Admitting: Podiatry

## 2022-05-08 DIAGNOSIS — L97422 Non-pressure chronic ulcer of left heel and midfoot with fat layer exposed: Secondary | ICD-10-CM

## 2022-05-08 DIAGNOSIS — E13621 Other specified diabetes mellitus with foot ulcer: Secondary | ICD-10-CM

## 2022-05-08 NOTE — Progress Notes (Signed)
  Subjective:  Patient ID: TZIPORA MCINROY, female    DOB: 03/06/1961,  MRN: [id number]  Chief Complaint  Patient presents with   Diabetic Ulcer    4 week follow up left midfoot     61 y.o. female returns for post-op check.  Still says is doing well is not having much drainage at all  Review of Systems: Negative except as noted in the HPI. Denies N/V/F/Ch.   Objective:  There were no vitals filed for this visit. There is no height or weight on file to calculate BMI. Constitutional Well developed. Well nourished.  Vascular Foot warm and well perfused. Capillary refill normal to all digits.  Calf is soft and supple, no posterior calf or knee pain, negative Homans' sign  Neurologic Normal speech. Oriented to person, place, and time. Epicritic sensation to light touch grossly present bilaterally.  Dermatologic Ulceration has callused over with hyperkeratotic tissue.  Thickened elongated brown-yellow dystrophic nails on the right foot  Orthopedic: She has no tenderness to palpation noted about the surgical site.       Assessment:   1. 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  S/p foot surgery left -Still overall doing fairly well and callused over.  There is some bleeding in the callus.  No deep tissue exposure or ulceration, limited breakdown of skin.  The overlying hyperkeratosis was debrided.  It was dressed with Iodosorb and a bandage today  Return in about 5 weeks (around 06/12/2022) for wound care.

## 2022-05-29 ENCOUNTER — Ambulatory Visit (INDEPENDENT_AMBULATORY_CARE_PROVIDER_SITE_OTHER): Payer: Medicaid Other | Admitting: Family

## 2022-05-29 ENCOUNTER — Encounter: Payer: Self-pay | Admitting: Family

## 2022-05-29 VITALS — BP 128/80 | HR 69 | Temp 97.6°F | Wt 208.4 lb

## 2022-05-29 DIAGNOSIS — E1042 Type 1 diabetes mellitus with diabetic polyneuropathy: Secondary | ICD-10-CM

## 2022-05-29 DIAGNOSIS — Z1211 Encounter for screening for malignant neoplasm of colon: Secondary | ICD-10-CM | POA: Diagnosis not present

## 2022-05-29 DIAGNOSIS — I700 Atherosclerosis of aorta: Secondary | ICD-10-CM | POA: Diagnosis not present

## 2022-05-29 DIAGNOSIS — E785 Hyperlipidemia, unspecified: Secondary | ICD-10-CM | POA: Diagnosis not present

## 2022-05-29 DIAGNOSIS — E1065 Type 1 diabetes mellitus with hyperglycemia: Secondary | ICD-10-CM

## 2022-05-29 DIAGNOSIS — I10 Essential (primary) hypertension: Secondary | ICD-10-CM

## 2022-05-29 DIAGNOSIS — R7309 Other abnormal glucose: Secondary | ICD-10-CM

## 2022-05-29 LAB — CBC WITH DIFFERENTIAL/PLATELET
Basophils Absolute: 0.1 10*3/uL (ref 0.0–0.1)
Basophils Relative: 0.7 % (ref 0.0–3.0)
Eosinophils Absolute: 0.6 10*3/uL (ref 0.0–0.7)
Eosinophils Relative: 6.6 % — ABNORMAL HIGH (ref 0.0–5.0)
HCT: 42.6 % (ref 36.0–46.0)
Hemoglobin: 13.7 g/dL (ref 12.0–15.0)
Lymphocytes Relative: 21.2 % (ref 12.0–46.0)
Lymphs Abs: 1.8 10*3/uL (ref 0.7–4.0)
MCHC: 32.1 g/dL (ref 30.0–36.0)
MCV: 83.7 fl (ref 78.0–100.0)
Monocytes Absolute: 0.8 10*3/uL (ref 0.1–1.0)
Monocytes Relative: 9.1 % (ref 3.0–12.0)
Neutro Abs: 5.2 10*3/uL (ref 1.4–7.7)
Neutrophils Relative %: 62.4 % (ref 43.0–77.0)
Platelets: 205 10*3/uL (ref 150.0–400.0)
RBC: 5.09 Mil/uL (ref 3.87–5.11)
RDW: 14.5 % (ref 11.5–15.5)
WBC: 8.4 10*3/uL (ref 4.0–10.5)

## 2022-05-29 LAB — COMPREHENSIVE METABOLIC PANEL
ALT: 14 U/L (ref 0–35)
AST: 17 U/L (ref 0–37)
Albumin: 3.8 g/dL (ref 3.5–5.2)
Alkaline Phosphatase: 74 U/L (ref 39–117)
BUN: 39 mg/dL — ABNORMAL HIGH (ref 6–23)
CO2: 33 mEq/L — ABNORMAL HIGH (ref 19–32)
Calcium: 9.4 mg/dL (ref 8.4–10.5)
Chloride: 102 mEq/L (ref 96–112)
Creatinine, Ser: 2.27 mg/dL — ABNORMAL HIGH (ref 0.40–1.20)
GFR: 22.54 mL/min — ABNORMAL LOW (ref >60.00)
Glucose, Bld: 178 mg/dL — ABNORMAL HIGH (ref 70–99)
Potassium: 5 mEq/L (ref 3.5–5.1)
Sodium: 140 mEq/L (ref 135–145)
Total Bilirubin: 0.6 mg/dL (ref 0.2–1.2)
Total Protein: 7 g/dL (ref 6.0–8.3)

## 2022-05-29 LAB — LIPID PANEL
Cholesterol: 131 mg/dL (ref 0–200)
HDL: 52.7 mg/dL (ref >39.00)
LDL Cholesterol: 64 mg/dL (ref 0–99)
NonHDL: 78.5
Total CHOL/HDL Ratio: 2
Triglycerides: 72 mg/dL (ref 0.0–149.0)
VLDL: 14.4 mg/dL (ref 0.0–40.0)

## 2022-05-29 LAB — POCT GLYCOSYLATED HEMOGLOBIN (HGB A1C): Hemoglobin A1C: 9 % — AB (ref 4.0–5.6)

## 2022-05-29 LAB — MICROALBUMIN / CREATININE URINE RATIO
Creatinine,U: 202.2 mg/dL
Microalb Creat Ratio: 13.4 mg/g (ref 0.0–30.0)
Microalb, Ur: 27.1 mg/dL — ABNORMAL HIGH (ref 0.0–1.9)

## 2022-05-29 NOTE — Assessment & Plan Note (Signed)
Chronic, stable.  Continue carvedilol 12.5 mg twice daily, enalapril 40 mg, nifedipine 30mg  QD, Farxiga 10 mg. Hydralazine 1/2 tablet PRN TID, fortunately she has not had to use.

## 2022-05-29 NOTE — Progress Notes (Signed)
Assessment & Plan:  Elevated glucose -     POCT glycosylated hemoglobin (Hb A1C) -     CBC with Differential/Platelet  Primary hypertension Assessment & Plan: Chronic, stable.  Continue carvedilol 12.5 mg twice daily, enalapril 40 mg, nifedipine 30mg  QD, Farxiga 10 mg. Hydralazine 1/2 tablet PRN TID, fortunately she has not had to use.    Orders: -     Comprehensive metabolic panel  Hyperlipidemia, unspecified hyperlipidemia type -     Lipid panel  Screening for colon cancer -     Cologuard  DM type 1 with diabetic peripheral neuropathy [facility]) Assessment & Plan: Lab Results  Component Value Date   HGBA1C 9.0 (A) 05/29/2022  Severely uncontrolled.  She follows endocrinology and have shared A1c with endocrinology.  Will follow  Orders: -     Microalbumin / creatinine urine ratio  Aortic atherosclerosis (HCC) Assessment & Plan: Chronic, symptomatically stable.  Pending lipid panel. Continue Crestor 20 mg      Return precautions given.   Risks, benefits, and alternatives of the medications and treatment plan prescribed today were discussed, and patient expressed understanding.   Education regarding symptom management and diagnosis given to patient on AVS either electronically or printed.  No follow-ups on file.  Mable Paris, FNP  Subjective:    Patient ID: Chelsea Barton, female    DOB: 1960-08-11, 61 y.o.   MRN: [id number]  CC: Chelsea Barton is a 61 y.o. female who presents today for follow up.   HPI: Feels well today.  No new complaints   Hypertension-compliant with carvedilol 12.5 mg twice daily, enalapril 40 mg, nifedipine 30mg  QD. She has hydralazine 1/2 tablet PRN TID. She hasn't had to use hydralazine.    Diabetes-follows with endocrinology, Dr Kelton Pillar.  Follow-up scheduled next month.  Follow-up Dr. Charlestine Night 09/07/2021 for mitral valve insufficiency, hypertension, hypercholesteremia  She has no fam hx colon cancer. She is interested in cologuard.    Allergies: Patient has no known allergies. Current Outpatient Medications on File Prior to Visit  Medication Sig Dispense Refill   aspirin 81 MG EC tablet Take 1 tablet (81 mg total) by mouth daily. Swallow whole. 30 tablet 12   carvedilol (COREG) 12.5 MG tablet Take 1 tablet (12.5 mg total) by mouth 2 (two) times daily with a meal. Take one tablet by mouth daily with a meal. 180 tablet 1   cephALEXin (KEFLEX) 500 MG capsule Take 1 capsule (500 mg total) by mouth 4 (four) times daily. 21 capsule 0   Continuous Blood Gluc Sensor (DEXCOM G6 SENSOR) MISC Inject 1 Device into the skin as directed. Every 9 each 3   Continuous Blood Gluc Transmit (DEXCOM G6 TRANSMITTER) MISC Inject 1 Device into the skin as directed. Every 10 days 1 each 3   dapagliflozin propanediol (FARXIGA) 10 MG TABS tablet Take 1 tablet (10 mg total) by mouth daily. 30 tablet 3   enalapril (VASOTEC) 20 MG tablet Take 2 tablets (40 mg total) by mouth daily. 180 tablet 3   gentamicin ointment (GARAMYCIN) 0.1 % Apply 1 Application topically daily. 30 g 0   glucose blood (ACCU-CHEK AVIVA PLUS) test strip Used to check blood sugar 4x daily. 200 each 12   hydrALAZINE (APRESOLINE) 10 MG tablet TAKE 1/2 TABLET BY MOUTH THREE TIMES DAILY AS NEEDED(IF BLOOD PRESSURE IS GREATER THAN 140/90) 30 tablet 1   insulin glargine (LANTUS) 100 UNIT/ML Solostar Pen Inject 36 Units into the skin daily. (Patient taking differently: Inject 36 Units  into the skin at bedtime.) 30 mL 6   Insulin Pen Needle (EASY TOUCH PEN NEEDLES) 31G X 5 MM MISC Check sugar 3-4 daily 100 each 2   Insulin Pen Needle 31G X 5 MM MISC 1 Device by Does not apply route in the morning, at noon, in the evening, and at bedtime. 400 each 2   Multiple Vitamin (MULTIVITAMIN) capsule Take 1 capsule by mouth daily.     NIFEdipine (PROCARDIA-XL/NIFEDICAL-XL) 30 MG 24 hr tablet Take 1 tablet (30 mg total) by mouth daily. 30 tablet 12   NOVOLOG FLEXPEN 100 UNIT/ML FlexPen Max daily 40  units (Patient taking differently: 5-12 Units 3 (three) times daily with meals. Max daily 40 units 5 u morning, 10 u lunch, 12 u supper) 30 mL 6   rosuvastatin (CRESTOR) 10 MG tablet Take 1 tablet (10 mg total) by mouth daily. 90 tablet 3   traMADol (ULTRAM) 50 MG tablet Take 1 tablet (50 mg total) by mouth every 6 (six) hours as needed. 20 tablet 0   Vitamin D, Cholecalciferol, 10 MCG (400 UNIT) CAPS Take 1 capsule by mouth daily.     No current facility-administered medications on file prior to visit.    Review of Systems  Constitutional:  Negative for chills and fever.  Respiratory:  Negative for cough.   Cardiovascular:  Negative for chest pain and palpitations.  Gastrointestinal:  Negative for nausea and vomiting.      Objective:    BP 128/80   Pulse 69   Temp 97.6 F (36.4 C) (Oral)   Wt 208 lb 6.4 oz (94.5 kg)   SpO2 98%   BMI 34.68 kg/m  BP Readings from Last 3 Encounters:  05/29/22 128/80  03/28/22 131/64  12/26/21 139/73   Wt Readings from Last 3 Encounters:  05/29/22 208 lb 6.4 oz (94.5 kg)  03/28/22 206 lb (93.4 kg)  12/26/21 192 lb 0.3 oz (87.1 kg)    Physical Exam Vitals reviewed.  Constitutional:      Appearance: She is well-developed.  Eyes:     Conjunctiva/sclera: Conjunctivae normal.  Cardiovascular:     Rate and Rhythm: Normal rate and regular rhythm.     Pulses: Normal pulses.     Heart sounds: Normal heart sounds.  Pulmonary:     Effort: Pulmonary effort is normal.     Breath sounds: Normal breath sounds. No wheezing, rhonchi or rales.  Skin:    General: Skin is warm and dry.  Neurological:     Mental Status: She is alert.  Psychiatric:        Speech: Speech normal.        Behavior: Behavior normal.        Thought Content: Thought content normal.

## 2022-05-29 NOTE — Patient Instructions (Addendum)
Please call endocrinology, Dr. Kelton Pillar and  ask if you can alternate with virtual appointments due to transportation issues.   Nice to see you!

## 2022-05-29 NOTE — Assessment & Plan Note (Signed)
Chronic, symptomatically stable.  Pending lipid panel. Continue Crestor 20 mg

## 2022-05-29 NOTE — Assessment & Plan Note (Signed)
Lab Results  Component Value Date   HGBA1C 9.0 (A) 05/29/2022  Severely uncontrolled.  She follows endocrinology and have shared A1c with endocrinology.  Will follow

## 2022-05-30 ENCOUNTER — Ambulatory Visit
Admission: RE | Admit: 2022-05-30 | Discharge: 2022-05-30 | Disposition: A | Discharge status: Home / Self Care | Payer: Medicaid Other | Source: Ambulatory Visit | Attending: Family | Admitting: Family

## 2022-05-30 DIAGNOSIS — Z1231 Encounter for screening mammogram for malignant neoplasm of breast: Secondary | ICD-10-CM | POA: Diagnosis not present

## 2022-05-31 ENCOUNTER — Telehealth: Payer: Self-pay

## 2022-05-31 NOTE — Telephone Encounter (Signed)
Please contact patient to change appointment. If she is wanting to change her appointment to virtual we will need her blood sugar readings. Looks like she has Dexcom but it not connect where we can pull it up.  She will have to come in and get labs done as well

## 2022-05-31 NOTE — Telephone Encounter (Signed)
Patient was contacted and said that she wants to come in to office for 06/18/2022 appointment, but the follow up on that visit she would like a virtual visit.  Patient will discuss at 06/18/2022 appointment.

## 2022-06-04 ENCOUNTER — Telehealth: Payer: Self-pay

## 2022-06-04 ENCOUNTER — Other Ambulatory Visit (HOSPITAL_COMMUNITY): Payer: Self-pay

## 2022-06-04 NOTE — Telephone Encounter (Signed)
Patient Advocate Encounter   Received notification from [facility] that prior authorization is required for Dexcom G6 Sensor through [employer].  Submitted: 06/04/22 Key [id number]  Status is pending

## 2022-06-05 ENCOUNTER — Ambulatory Visit: Payer: Medicaid Other | Admitting: Podiatry

## 2022-06-05 VITALS — BP 159/76 | HR 78

## 2022-06-05 DIAGNOSIS — L97421 Non-pressure chronic ulcer of left heel and midfoot limited to breakdown of skin: Secondary | ICD-10-CM | POA: Diagnosis not present

## 2022-06-05 DIAGNOSIS — E13621 Other specified diabetes mellitus with foot ulcer: Secondary | ICD-10-CM | POA: Diagnosis not present

## 2022-06-05 NOTE — Patient Instructions (Signed)
Look for urea 40% with salicylic acid 2% cream or ointment and apply to the thickened dry skin / calluses. This can be bought over the counter, at a pharmacy or online such as [employer].  

## 2022-06-05 NOTE — Progress Notes (Signed)
  Subjective:  Patient ID: Chelsea Barton, female    DOB: [birth year],  MRN: [id number]  Chief Complaint  Patient presents with   Diabetic Ulcer    3 week follow up left foot     61 y.o. female returns for post-op check.  Still says is doing well is not having much drainage at all  Review of Systems: Negative except as noted in the HPI. Denies N/V/F/Ch.   Objective:   Vitals:   06/05/22 0821  BP: (!) 159/76  Pulse: 78   There is no height or weight on file to calculate BMI. Constitutional Well developed. Well nourished.  Vascular Foot warm and well perfused. Capillary refill normal to all digits.  Calf is soft and supple, no posterior calf or knee pain, negative Homans' sign  Neurologic Normal speech. Oriented to person, place, and time. Epicritic sensation to light touch grossly present bilaterally.  Dermatologic Ulceration has callused over with hyperkeratotic tissue.  Thickened elongated brown-yellow dystrophic nails on the right foot  Orthopedic: She has no tenderness to palpation noted about the surgical site.       Assessment:   1. 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  S/p foot surgery left -Still overall doing fairly well and callused over.  There is some bleeding in the callus.  No deep tissue exposure or ulceration, limited breakdown of skin.  The overlying hyperkeratosis was debrided.  It was dressed with Iodosorb and a bandage today  Return in about 1 month (around 07/06/2022) for wound care.

## 2022-06-11 ENCOUNTER — Telehealth: Payer: Self-pay | Admitting: Family

## 2022-06-11 ENCOUNTER — Other Ambulatory Visit: Payer: Self-pay

## 2022-06-11 DIAGNOSIS — E1039 Type 1 diabetes mellitus with other diabetic ophthalmic complication: Secondary | ICD-10-CM

## 2022-06-11 LAB — COLOGUARD

## 2022-06-11 MED ORDER — DEXCOM G6 SENSOR MISC: 1.0000 | 3 refills | Status: DC

## 2022-06-11 NOTE — Telephone Encounter (Signed)
sent 

## 2022-06-11 NOTE — Telephone Encounter (Signed)
Pt need refill of glucose sensor sent to home delivery

## 2022-06-13 ENCOUNTER — Telehealth: Payer: Self-pay | Admitting: Internal Medicine

## 2022-06-13 NOTE — Telephone Encounter (Signed)
Patient is calling to say that she went to the pharmacy to pick up her Dexcom G6 sensors and [facility], [place] [address] (Ph: [phone])  told her that insurance would not cover the prescription because the paper work was not completed.  Patient states that she has been out out since last week so she has not had a reading in over one week.

## 2022-06-13 NOTE — Telephone Encounter (Signed)
Pharmacy Patient Advocate Encounter  Received notification from [facility] that the request for prior authorization for Dexcom has been denied due to Not meeting Medical necessity requirements.    Submitted as an initial authorization, however, pt has been on therapy. Resubmitted as a reauthorizations, however with the pt's elevated A1C, I'm not sure we can show improved glycemic control, so It may still be denied.   Key: [id number]

## 2022-06-14 NOTE — Telephone Encounter (Signed)
Pharmacy Patient Advocate Encounter  Prior Authorization for [place] has been approved.    PA# [id number] Effective dates: 06/13/22 through 06/14/23  Karie Soda, [place] Patient Advocate Specialist Direct Number: [phone] Fax: [phone]

## 2022-06-18 ENCOUNTER — Ambulatory Visit: Payer: Medicaid Other | Admitting: Internal Medicine

## 2022-06-18 ENCOUNTER — Other Ambulatory Visit: Payer: Self-pay

## 2022-06-18 DIAGNOSIS — Z1211 Encounter for screening for malignant neoplasm of colon: Secondary | ICD-10-CM

## 2022-06-18 DIAGNOSIS — E1039 Type 1 diabetes mellitus with other diabetic ophthalmic complication: Secondary | ICD-10-CM

## 2022-06-18 MED ORDER — DEXCOM G6 SENSOR MISC: 1.0000 | 3 refills | Status: DC

## 2022-06-18 NOTE — Telephone Encounter (Signed)
Pt need sensors [facility]

## 2022-06-18 NOTE — Progress Notes (Signed)
Order cologuard 

## 2022-06-18 NOTE — Telephone Encounter (Signed)
Spoke to patient about Dexcom 6  sensor and called Pharmacy and got it straightened out. Pt can pick up sensor at Pharmacy

## 2022-06-18 NOTE — Telephone Encounter (Signed)
Pt would like the cma to call her because she stated it is very important

## 2022-06-18 NOTE — Telephone Encounter (Signed)
RX  SENSOR SENT IN PT IS AWARE

## 2022-06-19 ENCOUNTER — Other Ambulatory Visit (HOSPITAL_COMMUNITY): Payer: Self-pay

## 2022-06-22 DIAGNOSIS — Z1211 Encounter for screening for malignant neoplasm of colon: Secondary | ICD-10-CM | POA: Diagnosis not present

## 2022-07-02 LAB — COLOGUARD: COLOGUARD: NEGATIVE

## 2022-07-08 ENCOUNTER — Ambulatory Visit: Payer: Medicaid Other | Admitting: Podiatry

## 2022-07-08 DIAGNOSIS — L97421 Non-pressure chronic ulcer of left heel and midfoot limited to breakdown of skin: Secondary | ICD-10-CM | POA: Diagnosis not present

## 2022-07-08 DIAGNOSIS — E13621 Other specified diabetes mellitus with foot ulcer: Secondary | ICD-10-CM

## 2022-07-08 DIAGNOSIS — B351 Tinea unguium: Secondary | ICD-10-CM | POA: Diagnosis not present

## 2022-07-08 DIAGNOSIS — M79674 Pain in right toe(s): Secondary | ICD-10-CM | POA: Diagnosis not present

## 2022-07-08 NOTE — Progress Notes (Signed)
  Subjective:  Patient ID: Chelsea Barton, female    DOB: 31-Jan-1961,  MRN: [id number]  Chief Complaint  Patient presents with   Diabetic Ulcer    4 week follow up diabetic ulcer of the left foot   Nail Problem    Nail trim right foot      62 y.o. female returns for post-op check.  Still says is doing well is not having much drainage at all, she has been using the cream. Nails are thickened and elongated on the other foot.  Review of Systems: Negative except as noted in the HPI. Denies N/V/F/Ch.   Objective:  There were no vitals filed for this visit. There is no height or weight on file to calculate BMI. Constitutional Well developed. Well nourished.  Vascular Foot warm and well perfused. Capillary refill normal to all digits.  Calf is soft and supple, no posterior calf or knee pain, negative Homans' sign  Neurologic Normal speech. Oriented to person, place, and time. Epicritic sensation to light touch grossly present bilaterally.  Dermatologic Ulceration has callused over with hyperkeratotic tissue.  Thickened elongated brown-yellow dystrophic nails on the right foot  Orthopedic: She has no tenderness to palpation noted about the surgical site.       Assessment:   1. 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 Pain due to onychomycosis of toenail of right foot     Plan:  Patient was evaluated and treated and all questions answered.  S/p foot surgery left -Still overall doing fairly well and callused over.  There is some bleeding in the callus.  No deep tissue exposure or ulceration, limited breakdown of skin. Continue using urea cream. Can leave OTA   Discussed the etiology and treatment options for the condition in detail with the patient. Educated patient on the topical and oral treatment options for mycotic nails. Recommended debridement of the nails today. Sharp and mechanical debridement  performed of all painful and mycotic nails today. Nails debrided in length and thickness using a nail nipper to level of comfort. Discussed treatment options including appropriate shoe gear. Follow up as needed for painful nails.   Return in about 5 weeks (around 08/12/2022) for wound care.

## 2022-07-15 ENCOUNTER — Other Ambulatory Visit: Payer: Self-pay | Admitting: Family

## 2022-07-15 DIAGNOSIS — E785 Hyperlipidemia, unspecified: Secondary | ICD-10-CM

## 2022-08-12 ENCOUNTER — Encounter: Payer: Self-pay | Admitting: Podiatry

## 2022-08-12 ENCOUNTER — Ambulatory Visit: Payer: Medicaid Other | Admitting: Podiatry

## 2022-08-12 DIAGNOSIS — E13621 Other specified diabetes mellitus with foot ulcer: Secondary | ICD-10-CM

## 2022-08-12 DIAGNOSIS — L97421 Non-pressure chronic ulcer of left heel and midfoot limited to breakdown of skin: Secondary | ICD-10-CM | POA: Diagnosis not present

## 2022-08-12 NOTE — Progress Notes (Signed)
  Subjective:  Patient ID: Chelsea Barton, female    DOB: December 13, 1960,  MRN: [id number]  Chief Complaint  Patient presents with   Callouses    "It's doing good."     62 y.o. female returns for post-op check.  Continues to be very thick  Review of Systems: Negative except as noted in the HPI. Denies N/V/F/Ch.   Objective:  There were no vitals filed for this visit. There is no height or weight on file to calculate BMI. Constitutional Well developed. Well nourished.  Vascular Foot warm and well perfused. Capillary refill normal to all digits.  Calf is soft and supple, no posterior calf or knee pain, negative Homans' sign  Neurologic Normal speech. Oriented to person, place, and time. Epicritic sensation to light touch grossly present bilaterally.  Dermatologic Ulceration has callused over with hyperkeratotic tissue.  Bleeding on debridement of the callus thickened elongated brown-yellow dystrophic nails on the right foot  Orthopedic: She has no tenderness to palpation noted about the surgical site.       Assessment:   1. 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  S/p foot surgery left -Still has a very thick callus that bleeds upon debridement.  Discussed with her the option of attempting a second graft to see if this alleviates further.  She will consider this.  Callus was debrided today.  I will see her back in 4 weeks for follow-up.   Return in about 4 weeks (around 09/09/2022) for wound care.

## 2022-08-19 ENCOUNTER — Other Ambulatory Visit: Payer: Self-pay | Admitting: Family

## 2022-08-19 ENCOUNTER — Telehealth: Payer: Self-pay | Admitting: Family

## 2022-08-19 ENCOUNTER — Ambulatory Visit (INDEPENDENT_AMBULATORY_CARE_PROVIDER_SITE_OTHER): Payer: Medicaid Other | Admitting: Family

## 2022-08-19 ENCOUNTER — Other Ambulatory Visit: Payer: Self-pay | Admitting: Internal Medicine

## 2022-08-19 VITALS — BP 130/70 | HR 70 | Temp 98.4°F | Ht 65.0 in | Wt 202.2 lb

## 2022-08-19 DIAGNOSIS — E1039 Type 1 diabetes mellitus with other diabetic ophthalmic complication: Secondary | ICD-10-CM

## 2022-08-19 DIAGNOSIS — I10 Essential (primary) hypertension: Secondary | ICD-10-CM | POA: Diagnosis not present

## 2022-08-19 DIAGNOSIS — N184 Chronic kidney disease, stage 4 (severe): Secondary | ICD-10-CM

## 2022-08-19 MED ORDER — ENALAPRIL MALEATE 20 MG PO TABS: 40.0000 mg | ORAL_TABLET | Freq: Every day | ORAL | 3 refills | Status: DC

## 2022-08-19 MED ORDER — DAPAGLIFLOZIN PROPANEDIOL 10 MG PO TABS
10.0000 mg | ORAL_TABLET | Freq: Every day | ORAL | 0 refills | Status: DC
Start: 2022-08-19 — End: 2022-12-02

## 2022-08-19 MED ORDER — CARVEDILOL 12.5 MG PO TABS
12.5000 mg | ORAL_TABLET | Freq: Two times a day (BID) | ORAL | 1 refills | Status: DC
Start: 2022-08-19 — End: 2023-04-09

## 2022-08-19 NOTE — Assessment & Plan Note (Signed)
Chronic, stable. Continue enalapril 20 mg daily, carvedilol 12.5 mg twice daily, nifedipine 30 mg.

## 2022-08-19 NOTE — Progress Notes (Signed)
Assessment & Plan:  Chronic kidney disease, stage IV (severe) (HCC) Assessment & Plan: compliant with Farxiga 10 mg daily as previously managed by nephrology.  eGFR 22.  She is overdue for follow-up.  Call out to Dr. Assunta Gambles office to schedule an appointment.  Refilled Farxiga 10 mg for 90 days  Orders: -     Dapagliflozin Propanediol; Take 1 tablet (10 mg total) by mouth daily.  Dispense: 90 tablet; Refill: 0  Essential hypertension -     Enalapril Maleate; Take 2 tablets (40 mg total) by mouth daily.  Dispense: 180 tablet; Refill: 3 -     Carvedilol; Take 1 tablet (12.5 mg total) by mouth 2 (two) times daily with a meal. Take one tablet by mouth daily with a meal.  Dispense: 180 tablet; Refill: 1  Benign essential hypertension Assessment & Plan: Chronic, stable. Continue enalapril 20 mg daily, carvedilol 12.5 mg twice daily, nifedipine 30 mg.      Return precautions given.   Risks, benefits, and alternatives of the medications and treatment plan prescribed today were discussed, and patient expressed understanding.   Education regarding symptom management and diagnosis given to patient on AVS either electronically or printed.  Return in about 4 months (around 12/19/2022).  Mable Paris, FNP  Subjective:    Patient ID: Chelsea Barton, female    DOB: 1961/06/05, 62 y.o.   MRN: [id number]  CC: Chelsea Barton is a 62 y.o. female who presents today for follow up.   HPI: Feels well today.  No new complaints.  She is compliant with  Compliant with enalapril 20 mg daily, carvedilol 12.5 mg twice daily, nifedipine 30 mg.  Denies chest pain, shortness of breath  Last seen by endocrinology 12/2021, Dr Kelton Pillar last seen by nephrology, Dr. Juleen China 06/06/2021 CKD-compliant with [place] 10 mg daily , eGFR 22   Allergies: Patient has no known allergies. Current Outpatient Medications on File Prior to Visit  Medication Sig Dispense Refill   aspirin 81 MG EC tablet Take 1 tablet  (81 mg total) by mouth daily. Swallow whole. 30 tablet 12   Continuous Blood Gluc Transmit (DEXCOM G6 TRANSMITTER) MISC Inject 1 Device into the skin as directed. Every 10 days 1 each 3   gentamicin ointment (GARAMYCIN) 0.1 % Apply 1 Application topically daily. 30 g 0   glucose blood (ACCU-CHEK AVIVA PLUS) test strip Used to check blood sugar 4x daily. 200 each 12   hydrALAZINE (APRESOLINE) 10 MG tablet TAKE 1/2 TABLET BY MOUTH THREE TIMES DAILY AS NEEDED(IF BLOOD PRESSURE IS GREATER THAN 140/90) 30 tablet 1   insulin glargine (LANTUS) 100 UNIT/ML Solostar Pen Inject 36 Units into the skin daily. (Patient taking differently: Inject 36 Units into the skin at bedtime.) 30 mL 6   Insulin Pen Needle (EASY TOUCH PEN NEEDLES) 31G X 5 MM MISC Check sugar 3-4 daily 100 each 2   Insulin Pen Needle 31G X 5 MM MISC 1 Device by Does not apply route in the morning, at noon, in the evening, and at bedtime. 400 each 2   Multiple Vitamin (MULTIVITAMIN) capsule Take 1 capsule by mouth daily.     NIFEdipine (PROCARDIA-XL/NIFEDICAL-XL) 30 MG 24 hr tablet TAKE 1 TABLET(30 MG) BY MOUTH DAILY 30 tablet 12   NOVOLOG FLEXPEN 100 UNIT/ML FlexPen Max daily 40 units (Patient taking differently: 5-12 Units 3 (three) times daily with meals. Max daily 40 units 5 u morning, 10 u lunch, 12 u supper) 30 mL 6   rosuvastatin (  CRESTOR) 10 MG tablet TAKE 1 TABLET(10 MG) BY MOUTH DAILY 90 tablet 3   traMADol (ULTRAM) 50 MG tablet Take 1 tablet (50 mg total) by mouth every 6 (six) hours as needed. 20 tablet 0   Vitamin D, Cholecalciferol, 10 MCG (400 UNIT) CAPS Take 1 capsule by mouth daily.     No current facility-administered medications on file prior to visit.    Review of Systems  Constitutional:  Negative for chills and fever.  Respiratory:  Negative for cough.   Cardiovascular:  Negative for chest pain and palpitations.  Gastrointestinal:  Negative for nausea and vomiting.      Objective:    BP 130/70   Pulse 70    Temp 98.4 F (36.9 C) (Oral)   Ht '5\' 5"'$  (1.651 m)   Wt 202 lb 3.2 oz (91.7 kg)   SpO2 96%   BMI 33.65 kg/m  BP Readings from Last 3 Encounters:  08/19/22 130/70  06/05/22 (!) 159/76  05/29/22 128/80   Wt Readings from Last 3 Encounters:  08/19/22 202 lb 3.2 oz (91.7 kg)  05/29/22 208 lb 6.4 oz (94.5 kg)  03/28/22 206 lb (93.4 kg)    Physical Exam Vitals reviewed.  Constitutional:      Appearance: She is well-developed.  Eyes:     Conjunctiva/sclera: Conjunctivae normal.  Cardiovascular:     Rate and Rhythm: Normal rate and regular rhythm.     Pulses: Normal pulses.     Heart sounds: Normal heart sounds.  Pulmonary:     Effort: Pulmonary effort is normal.     Breath sounds: Normal breath sounds. No wheezing, rhonchi or rales.  Skin:    General: Skin is warm and dry.  Neurological:     Mental Status: She is alert.  Psychiatric:        Speech: Speech normal.        Behavior: Behavior normal.        Thought Content: Thought content normal.

## 2022-08-19 NOTE — Assessment & Plan Note (Signed)
compliant with Farxiga 10 mg daily as previously managed by nephrology.  eGFR 22.  She is overdue for follow-up.  Call out to Dr. Assunta Gambles office to schedule an appointment.  Refilled Farxiga 10 mg for 90 days

## 2022-08-19 NOTE — Telephone Encounter (Signed)
Message sent to patient on mychart with the phone number to call and schedule.  Andrue Dini,cma

## 2022-08-19 NOTE — Patient Instructions (Signed)
It is imperative that you maintain close follow-up nephrology.  Will call to schedule an appointment for you at [facility].

## 2022-08-19 NOTE — Telephone Encounter (Signed)
  Patient also overdue to see nephrology, Dr. Juleen China, at [facility] please call to schedule for her

## 2022-09-06 ENCOUNTER — Other Ambulatory Visit: Payer: Self-pay

## 2022-09-06 DIAGNOSIS — E1039 Type 1 diabetes mellitus with other diabetic ophthalmic complication: Secondary | ICD-10-CM

## 2022-09-06 MED ORDER — DEXCOM G6 TRANSMITTER MISC: 1.0000 | 3 refills | Status: DC

## 2022-09-06 MED ORDER — INSULIN GLARGINE 100 UNIT/ML SOLOSTAR PEN: 36.0000 [IU] | PEN_INJECTOR | Freq: Every day | SUBCUTANEOUS | 0 refills | Status: DC

## 2022-09-06 MED ORDER — NOVOLOG FLEXPEN 100 UNIT/ML ~~LOC~~ SOPN: PEN_INJECTOR | SUBCUTANEOUS | 0 refills | Status: DC

## 2022-09-06 NOTE — Addendum Note (Signed)
Addended by: Lauralyn Primes on: 09/06/2022 10:10 AM   Modules accepted: Orders

## 2022-09-06 NOTE — Addendum Note (Signed)
Addended by: Lauralyn Primes on: 09/06/2022 10:11 AM   Modules accepted: Orders

## 2022-09-11 ENCOUNTER — Telehealth: Payer: Self-pay | Admitting: Family

## 2022-09-11 ENCOUNTER — Ambulatory Visit: Payer: Medicaid Other | Admitting: Podiatry

## 2022-09-11 DIAGNOSIS — M79674 Pain in right toe(s): Secondary | ICD-10-CM

## 2022-09-11 DIAGNOSIS — E13621 Other specified diabetes mellitus with foot ulcer: Secondary | ICD-10-CM | POA: Diagnosis not present

## 2022-09-11 DIAGNOSIS — L97421 Non-pressure chronic ulcer of left heel and midfoot limited to breakdown of skin: Secondary | ICD-10-CM

## 2022-09-11 DIAGNOSIS — B351 Tinea unguium: Secondary | ICD-10-CM | POA: Diagnosis not present

## 2022-09-11 NOTE — Telephone Encounter (Signed)
Placed in provider folder to fill out

## 2022-09-11 NOTE — Telephone Encounter (Addendum)
Pt came into the office to drop off a form from [facility] for the provider to fill out. gave the form to the cma

## 2022-09-11 NOTE — Progress Notes (Signed)
  Subjective:  Patient ID: Chelsea Barton, female    DOB: May 19, 1961,  MRN: [id number]  Chief Complaint  Patient presents with   Diabetic Ulcer    4 week follow up left foot    Nail Problem    Thick painful toenails, 3 month follow up     62 y.o. female returns for post-op check.  Continues to be very thick, she has noted bleeding and drainage.  Toes on the right foot have thickened elongated nails.  Review of Systems: Negative except as noted in the HPI. Denies N/V/F/Ch.   Objective:  There were no vitals filed for this visit. There is no height or weight on file to calculate BMI. Constitutional Well developed. Well nourished.  Vascular Foot warm and well perfused. Capillary refill normal to all digits.  Calf is soft and supple, no posterior calf or knee pain, negative Homans' sign  Neurologic Normal speech. Oriented to person, place, and time. Epicritic sensation to light touch grossly present bilaterally.  Dermatologic Ulceration still present with hyperkeratotic tissue and bleeding on debridement of the callus thickened elongated brown-yellow dystrophic nails on the right foot  Orthopedic: She has no tenderness to palpation noted about the surgical site.       Assessment:   1. 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 Pain due to onychomycosis of toenail of right foot     Plan:  Patient was evaluated and treated and all questions answered.  S/p foot surgery left -Wound continues to ulcerate deep to the callus.  I recommended we proceed with considering with operational debridement and application of a skin substitute to see if this will facilitate further wound healing.  Discussed risk benefits and potential complications of this.  Discussed we can do this as an outpatient, apply a skin substitute and temporary wound VAC.  Informed consent signed and reviewed.  Surgery be scheduled at a  mutually agreeable date.  Nails were debrided in length and thickness using a sharp nail nipper to tolerance to reduce fungal load and improve pain and comfort.    Surgical plan:  Procedure: -Excisional debridement of left foot ulcer with application of skin substitute and negative pressure wound dressing  Location: -[facility]  Anesthesia plan: -IV sedation  Postoperative pain plan: - Tylenol 1000 mg every 6 hours, tramadol 50 mg every 6 hours as needed  DVT prophylaxis: -None required  WB Restrictions / DME needs: -WBAT in postop shoe after    No follow-ups on file.

## 2022-09-12 DIAGNOSIS — Z961 Presence of intraocular lens: Secondary | ICD-10-CM | POA: Diagnosis not present

## 2022-09-12 DIAGNOSIS — H26492 Other secondary cataract, left eye: Secondary | ICD-10-CM | POA: Diagnosis not present

## 2022-09-12 DIAGNOSIS — E113553 Type 2 diabetes mellitus with stable proliferative diabetic retinopathy, bilateral: Secondary | ICD-10-CM | POA: Diagnosis not present

## 2022-09-16 ENCOUNTER — Telehealth: Payer: Self-pay | Admitting: Family

## 2022-09-16 NOTE — Telephone Encounter (Signed)
Call patient I received surgery preop from [facility].  She is scheduled for outpatient surgery 10/04/2022. This needs to be completed 30 days prior to surgical procedure date.  Please schedule patient with me as soon as possible  Please call [facility] and request them to send cardiac clearance to Dr Nehemiah Massed as well

## 2022-09-17 ENCOUNTER — Other Ambulatory Visit (INDEPENDENT_AMBULATORY_CARE_PROVIDER_SITE_OTHER): Payer: Self-pay | Admitting: Nurse Practitioner

## 2022-09-17 ENCOUNTER — Telehealth: Payer: Self-pay

## 2022-09-17 ENCOUNTER — Telehealth: Payer: Self-pay | Admitting: Urology

## 2022-09-17 DIAGNOSIS — I739 Peripheral vascular disease, unspecified: Secondary | ICD-10-CM

## 2022-09-17 NOTE — Telephone Encounter (Signed)
DOS - 10/04/22  15004 - WOUND DEBRIDEMENT LEFT  15275 - GRAFT LEFT    [employer]  PER [employer] FOR CPT CODES 01027 AND 25366 Notification or Prior Authorization is not required for the requested services  Decision ID #: [id number]

## 2022-09-17 NOTE — Telephone Encounter (Signed)
Called [facility] and spoke to receptionist and she informed me that she was sending message to Dr Britt Bottom to send Cardiac clearance to Dr Nehemiah Massed

## 2022-09-18 ENCOUNTER — Ambulatory Visit: Payer: Medicaid Other | Admitting: Family

## 2022-09-18 ENCOUNTER — Telehealth: Payer: Self-pay | Admitting: Cardiology

## 2022-09-18 ENCOUNTER — Encounter: Payer: Self-pay | Admitting: Podiatry

## 2022-09-18 NOTE — Telephone Encounter (Signed)
No further information is needed 

## 2022-09-18 NOTE — Telephone Encounter (Signed)
I s/w the pt and she is agreeable to appt 09/26/22 @ 10:40 with Dr. Garen Lah in our [place] location. Pt thanked me for the help. I will update all parties involved.

## 2022-09-18 NOTE — Telephone Encounter (Signed)
   Pre-operative Risk Assessment    Patient Name: Chelsea Barton  DOB: August 17, 1960 MRN: [id number]      Request for Surgical Clearance    Procedure:   wound debridement and graft application on left foot   Date of Surgery:  Clearance 10/04/22                                 Surgeon:  Dr. Lanae Crumbly  Surgeon's Group or Practice Name:  [facility] Phone number:  [phone] Fax number:  [phone]   Type of Clearance Requested:   - Medical  - Pharmacy:  Hold        Type of Anesthesia:  Not Indicated   Additional requests/questions:      SignedMilbert Coulter   09/18/2022, 2:05 PM

## 2022-09-18 NOTE — Telephone Encounter (Signed)
   Name: Chelsea Barton  DOB: 1960/07/09  MRN: [id number]  Primary Cardiologist: Kate Sable, MD  Chart reviewed as part of pre-operative protocol coverage. Because of [name]'s past medical history and time since last visit, she will require a follow-up in-office visit in order to better assess preoperative cardiovascular risk.  Pre-op covering staff: - Please schedule appointment and call patient to inform them. If patient already had an upcoming appointment within acceptable timeframe, please add "pre-op clearance" to the appointment notes so provider is aware. - Please contact requesting surgeon's office via preferred method (i.e, phone, fax) to inform them of need for appointment prior to surgery.  Regarding ASA therapy, we recommend continuation of ASA throughout the perioperative period.  However, if the surgeon feels that cessation of ASA is required in the perioperative period, it may be stopped 5-7 days prior to surgery with a plan to resume it as soon as felt to be feasible from a surgical standpoint in the post-operative period.   Mable Fill, Marissa Nestle, NP  09/18/2022, 2:33 PM

## 2022-09-23 ENCOUNTER — Encounter: Payer: Self-pay | Admitting: Internal Medicine

## 2022-09-23 ENCOUNTER — Ambulatory Visit: Payer: Medicaid Other | Admitting: Family

## 2022-09-23 ENCOUNTER — Ambulatory Visit: Payer: Medicaid Other | Admitting: Internal Medicine

## 2022-09-23 VITALS — BP 126/78 | HR 99 | Ht 65.0 in | Wt 202.0 lb

## 2022-09-23 DIAGNOSIS — E1122 Type 2 diabetes mellitus with diabetic chronic kidney disease: Secondary | ICD-10-CM

## 2022-09-23 DIAGNOSIS — E11319 Type 2 diabetes mellitus with unspecified diabetic retinopathy without macular edema: Secondary | ICD-10-CM

## 2022-09-23 DIAGNOSIS — E1159 Type 2 diabetes mellitus with other circulatory complications: Secondary | ICD-10-CM

## 2022-09-23 DIAGNOSIS — N184 Chronic kidney disease, stage 4 (severe): Secondary | ICD-10-CM

## 2022-09-23 DIAGNOSIS — Z794 Long term (current) use of insulin: Secondary | ICD-10-CM | POA: Diagnosis not present

## 2022-09-23 LAB — POCT GLYCOSYLATED HEMOGLOBIN (HGB A1C): Hemoglobin A1C: 9.1 % — AB (ref 4.0–5.6)

## 2022-09-23 MED ORDER — OMNIPOD 5 DEXG7G6 INTRO GEN 5 KIT: 1.0000 | PACK | 0 refills | Status: DC

## 2022-09-23 MED ORDER — OMNIPOD 5 DEXG7G6 PODS GEN 5 MISC: 1.0000 | 3 refills | Status: DC

## 2022-09-23 MED ORDER — INSULIN GLARGINE 100 UNIT/ML SOLOSTAR PEN: 40.0000 [IU] | PEN_INJECTOR | Freq: Every day | SUBCUTANEOUS | 2 refills | Status: DC

## 2022-09-23 NOTE — Patient Instructions (Addendum)
-   Increase  Lantus to 40 units daily  - Novolog 5 units with Breakfast, 10 units with Lunch and 12 units with Supper  - Novolog correctional insulin: ADD extra units on insulin to your meal-time Novolog dose if your blood sugars are higher than 160. Use the scale below to help guide you:   Blood sugar before meal Number of units to inject  Less than 160 0 unit  161 -  190 1 units  191 -  220 2 units  221 -  250 3 units  251 -  280 4 units  281 -  310 5 units  311 -  340 6 units  341 -  370 7 units  371 -  400 8 units     HOW TO TREAT LOW BLOOD SUGARS (Blood sugar LESS THAN 70 MG/DL) Please follow the RULE OF 15 for the treatment of hypoglycemia treatment (when your (blood sugars are less than 70 mg/dL)   STEP 1: Take 15 grams of carbohydrates when your blood sugar is low, which includes:  3-4 GLUCOSE TABS  OR 3-4 OZ OF JUICE OR REGULAR SODA OR ONE TUBE OF GLUCOSE GEL    STEP 2: RECHECK blood sugar in 15 MINUTES STEP 3: If your blood sugar is still low at the 15 minute recheck --> then, go back to STEP 1 and treat AGAIN with another 15 grams of carbohydrates.

## 2022-09-23 NOTE — Progress Notes (Signed)
Name: Chelsea Barton  MRN/ DOB: [id number], [birth year]   Age/ Sex: 62 y.o., female    PCP: Allegra Grana, FNP   Reason for Endocrinology Evaluation: Type 2 Diabetes Mellitus     Date of Initial Endocrinology Visit: 07/20/2021    PATIENT IDENTIFIER: Chelsea Barton is a 62 y.o. female with a past medical history of T2DM , CAD and Dyslipidemia. The patient presented for initial endocrinology clinic visit on 07/20/2021 for consultative assistance with her diabetes management.    HPI: Chelsea Barton was    Diagnosed with DM in many years ago  Prior Medications tried/Intolerance: Metformin Currently checking blood sugars multiple x / day, through CGM.  Hemoglobin A1c has ranged from 7.3% in 2020, peaking at 9.5% in 2014.  Follows with Nephrology      SUBJECTIVE:   During the last visit (12/19/2021): A1c 7.8%  Today (09/23/22): Chelsea Barton is here for a follow up on diabetes management. She checks her  blood sugars multiple  times daily. But she forgot her receiver today. The patient has had hypoglycemic episodes since the last clinic visit. Which typically occur at lunch time   She continues to follow-up with podiatry for a left diabetic foot ulcer, scheduled for graft application 10/04/2022  Denies nausea, vomiting or diarrhea   HOME DIABETES REGIMEN: Lantus 36 units daily NovoLog 5 units with breakfast, 10 units with lunch, and 12 units with supper Correction factor: NovoLog (BG -130/30) Farxiga 10 mg through nephrology    Statin: yes ACE-I/ARB: no   CONTINUOUS GLUCOSE MONITORING RECORD INTERPRETATION    Dates of Recording: 3/26-09/23/2022  Sensor description: dexcom  Results statistics:   CGM use % of time 93  Average and SD 243/57  Time in range      14  %  % Time Above 180 41  % Time above 250 45  % Time Below target 0   Glycemic patterns summary: hyperglycemia noted during the day and night   Hyperglycemic episodes  all day and night   Hypoglycemic  episodes occurred n/a  Overnight periods: variable     DIABETIC COMPLICATIONS: Microvascular complications:  CKD IV, retinopathy, S/P left metatarsal amputation  Denies: neuropathy  Last eye exam: Completed 2022  Macrovascular complications:  CAD Denies:  PVD, CVA   PAST HISTORY: Past Medical History:  Past Medical History:  Diagnosis Date   Chronic kidney disease    Diabetes mellitus    Hyperlipidemia    Hypertension    Past Surgical History:  Past Surgical History:  Procedure Laterality Date   AMPUTATION TOE  04/10/2021   Procedure: AMPUTATION 2nd TOE, left foot;  Surgeon: Felecia Shelling, DPM;  Location: [facility];  Service: Podiatry;;   CATARACT EXTRACTION     eye surgery r Right    lense placed   GRAFT APPLICATION Left 12/26/2021   Procedure: GRAFT APPLICATION-left foot;  Surgeon: Edwin Cap, DPM;  Location: [facility];  Service: Podiatry;  Laterality: Left;   I & D EXTREMITY Left 12/26/2021   Procedure: IRRIGATION AND DEBRIDEMENT EXTREMITY-left foot;  Surgeon: Edwin Cap, DPM;  Location: [facility];  Service: Podiatry;  Laterality: Left;   INCISION AND DRAINAGE Left 04/18/2021   Procedure: INCISION AND DRAINAGE;  Surgeon: Edwin Cap, DPM;  Location: [facility];  Service: Podiatry;  Laterality: Left;   INCISION AND DRAINAGE OF WOUND Left 04/10/2021   Procedure: IRRIGATION AND DEBRIDEMENT WOUND;  Surgeon: Felecia Shelling, DPM;  Location: [facility];  Service: Podiatry;  Laterality: Left;   PERIPHERAL VASCULAR BALLOON ANGIOPLASTY Left 04/13/2021   Procedure: PERIPHERAL VASCULAR BALLOON ANGIOPLASTY;  Surgeon: Renford DillsSchnier, Gregory G, MD;  Location: [facility];  Service: Cardiovascular;  Laterality: Left;   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   Procedure: TRANSMETATARSAL AMPUTATION;  Surgeon: Candelaria StagersPatel, Kevin P, DPM;  Location: [facility];  Service: Podiatry;  Laterality: Left;    Social History:  reports that she quit smoking about 17 years ago. Her  smoking use included cigarettes. She has never used smokeless tobacco. She reports that she does not drink alcohol and does not use drugs. Family History:  Family History  Problem Relation Age of Onset   Diabetes Sister        3/5 sisters have DM   Breast cancer Neg Hx    Colon cancer Neg Hx      HOME MEDICATIONS: Allergies as of 09/23/2022   No Known Allergies      Medication List        Accurate as of September 23, 2022  9:06 AM. If you have any questions, ask your nurse or doctor.          Accu-Chek Aviva Plus test strip Generic drug: glucose blood Used to check blood sugar 4x daily.   aspirin EC 81 MG tablet Take 1 tablet (81 mg total) by mouth daily. Swallow whole.   carvedilol 12.5 MG tablet Commonly known as: COREG Take 1 tablet (12.5 mg total) by mouth 2 (two) times daily with a meal. Take one tablet by mouth daily with a meal.   dapagliflozin propanediol 10 MG Tabs tablet Commonly known as: FARXIGA Take 1 tablet (10 mg total) by mouth daily.   Dexcom G6 Sensor Misc PLACE A NEW SENSOR EVERY 10 DAYS. USE TO CHECK BLOOD SUGAR AT LEAST 4 TIMES DAILY   Dexcom G6 Transmitter Misc Inject 1 Device into the skin as directed. Every 10 days   enalapril 20 MG tablet Commonly known as: VASOTEC Take 2 tablets (40 mg total) by mouth daily.   gentamicin ointment 0.1 % Commonly known as: GARAMYCIN Apply 1 Application topically daily.   hydrALAZINE 10 MG tablet Commonly known as: APRESOLINE TAKE 1/2 TABLET BY MOUTH THREE TIMES DAILY AS NEEDED(IF BLOOD PRESSURE IS GREATER THAN 140/90)   insulin glargine 100 UNIT/ML Solostar Pen Commonly known as: LANTUS Inject 36 Units into the skin daily.   Insulin Pen Needle 31G X 5 MM Misc 1 Device by Does not apply route in the morning, at noon, in the evening, and at bedtime.   Easy Touch Pen Needles 31G X 5 MM Misc Generic drug: Insulin Pen Needle Check sugar 3-4 daily   multivitamin capsule Take 1 capsule by mouth  daily.   NIFEdipine 30 MG 24 hr tablet Commonly known as: PROCARDIA-XL/NIFEDICAL-XL TAKE 1 TABLET(30 MG) BY MOUTH DAILY   NovoLOG FlexPen 100 UNIT/ML FlexPen Generic drug: insulin aspart Max daily 40 units   rosuvastatin 10 MG tablet Commonly known as: CRESTOR TAKE 1 TABLET(10 MG) BY MOUTH DAILY   traMADol 50 MG tablet Commonly known as: Ultram Take 1 tablet (50 mg total) by mouth every 6 (six) hours as needed.   Vitamin D (Cholecalciferol) 10 MCG (400 UNIT) Caps Take 1 capsule by mouth daily.         ALLERGIES: No Known Allergies   REVIEW OF SYSTEMS: A comprehensive ROS was conducted with the patient and is negative except as per HPI and below:      OBJECTIVE:  VITAL SIGNS: BP 126/78 (BP Location: Left Arm, Patient Position: Sitting, Cuff Size: Large)   Pulse 99   Ht 5\' 5"  (1.651 m)   Wt 202 lb (91.6 kg)   SpO2 98%   BMI 33.61 kg/m    PHYSICAL EXAM:  General: Pt appears well and is in NAD  Lungs: Clear with good BS bilat  Heart: RRR   Extremities:  Lower extremities - Trace edema on the left   Neuro: MS is good with appropriate affect, pt is alert and Ox3   DM Foot Exam 09/11/2022 per podiatry    DATA REVIEWED:  Lab Results  Component Value Date   HGBA1C 9.1 (A) 09/23/2022   HGBA1C 9.0 (A) 05/29/2022   HGBA1C 7.8 (A) 11/27/2021     Latest Reference Range & Units 05/29/22 09:24  Sodium 135 - 145 mEq/L 140  Potassium 3.5 - 5.1 mEq/L 5.0  Chloride 96 - 112 mEq/L 102  CO2 19 - 32 mEq/L 33 (H)  Glucose 70 - 99 mg/dL 865 (H)  BUN 6 - 23 mg/dL 39 (H)  Creatinine 7.84 - 1.20 mg/dL 6.96 (H)  Calcium 8.4 - 10.5 mg/dL 9.4  Alkaline Phosphatase 39 - 117 U/L 74  Albumin 3.5 - 5.2 g/dL 3.8  AST 0 - 37 U/L 17  ALT 0 - 35 U/L 14  Total Protein 6.0 - 8.3 g/dL 7.0  Total Bilirubin 0.2 - 1.2 mg/dL 0.6  GFR >29.52 mL/min 22.54 (L)    Latest Reference Range & Units 05/29/22 09:24  Total CHOL/HDL Ratio  2  Cholesterol 0 - 200 mg/dL 841  HDL Cholesterol  >32.44 mg/dL 01.02  LDL (calc) 0 - 99 mg/dL 64  NonHDL  72.53  Triglycerides 0.0 - 149.0 mg/dL 66.4  VLDL 0.0 - 40.3 mg/dL 47.4    Latest Reference Range & Units 05/29/22 09:25  Creatinine,U mg/dL 259.5  Microalb, Ur 0.0 - 1.9 mg/dL 63.8 (H)  MICROALB/CREAT RATIO 0.0 - 30.0 mg/g 13.4       ASSESSMENT / PLAN / RECOMMENDATIONS:   1) Type 2 Diabetes Mellitus, Poorlly y controlled, With retinopathic, CKD IV and macrovascular complications - Most recent A1c of 9.1 %. Goal A1c < 7.0 %.    - Her A1c is skewed due to CKD - She continues with worsening glycemic control, we again discussed importance of optimizing glucose to prevent further renal damage, and promote healing of left foot  -She had declined  GLP-1 agonists in the past  - She also had declined insulin pump in the past but today I showed her the Omnipod and she agreed to it - In reviewing CGM, there's evidence of inconsistent insulin intake  -Her GFR is less than 25, hence there is no indication from the endocrinology standpoint to be on SGLT2 inhibitors, per patient this is prescribed by her nephrology so we will defer this to nephrology. - Will increase basal insulin as below      MEDICATIONS: Increase Lantus 40 units daily Continue NovoLog 5 units with breakfast, 10 units with lunch, and 12 units with supper Correction factor: NovoLog (BG -130/30)   EDUCATION / INSTRUCTIONS: BG monitoring instructions: Patient is instructed to check her blood sugars 3 times a day, before meals. Call [facility] if: BG persistently < 70 . I reviewed the Rule of 15 for the treatment of hypoglycemia in detail with the patient. Literature supplied.   2) Diabetic complications:  Eye: Does  have known diabetic retinopathy.  Neuro/ Feet: Does not have  known diabetic peripheral neuropathy. Renal: Patient does have known baseline CKD. She is on an ACEI/ARB at present.  3) Left Forefoot Amputation:   - Per podiatry     Follow-up in 6 months    Signed electronically by: Lyndle Herrlich, MD  [facility] Endocrinology  [facility] [address] [place], [place] [address] Phone: [phone] FAX: [phone]   CC: Allegra Grana, FNP [address] [place] [address] Phone: [phone]  Fax: [phone]    Return to Endocrinology clinic as below: Future Appointments  Date Time Provider Department Center  09/23/2022  9:10 AM Pailyn Bellevue, Konrad Dolores, MD [facility] None  09/24/2022 10:00 AM Allegra Grana, FNP [facility]  09/26/2022  8:30 AM [facility] VASC 3 [facility] None  09/26/2022  9:00 AM [facility] 3 [facility] None  09/26/2022  9:30 AM Georgiana Spinner, NP [facility] None  09/26/2022 10:40 AM Debbe Odea, MD [facility] None  09/26/2022  1:00 PM [facility] None  10/11/2022 12:30 PM Candelaria Stagers, DPM [facility]  10/23/2022  1:45 PM Edwin Cap, DPM [facility]  11/13/2022  1:15 PM McDonald, Rachelle Hora, DPM [facility]

## 2022-09-24 ENCOUNTER — Encounter: Payer: Self-pay | Admitting: Family

## 2022-09-24 ENCOUNTER — Ambulatory Visit (INDEPENDENT_AMBULATORY_CARE_PROVIDER_SITE_OTHER): Payer: Medicaid Other | Admitting: Family

## 2022-09-24 VITALS — BP 128/82 | HR 70 | Temp 97.8°F | Ht 67.0 in | Wt 204.2 lb

## 2022-09-24 DIAGNOSIS — E11621 Type 2 diabetes mellitus with foot ulcer: Secondary | ICD-10-CM

## 2022-09-24 DIAGNOSIS — L97422 Non-pressure chronic ulcer of left heel and midfoot with fat layer exposed: Secondary | ICD-10-CM

## 2022-09-24 LAB — CBC WITH DIFFERENTIAL/PLATELET
Basophils Absolute: 0.1 10*3/uL (ref 0.0–0.1)
Basophils Relative: 0.9 % (ref 0.0–3.0)
Eosinophils Absolute: 0.5 10*3/uL (ref 0.0–0.7)
Eosinophils Relative: 5.7 % — ABNORMAL HIGH (ref 0.0–5.0)
HCT: 43 % (ref 36.0–46.0)
Hemoglobin: 13.5 g/dL (ref 12.0–15.0)
Lymphocytes Relative: 21.6 % (ref 12.0–46.0)
Lymphs Abs: 1.9 10*3/uL (ref 0.7–4.0)
MCHC: 31.4 g/dL (ref 30.0–36.0)
MCV: 84.1 fl (ref 78.0–100.0)
Monocytes Absolute: 0.8 10*3/uL (ref 0.1–1.0)
Monocytes Relative: 9 % (ref 3.0–12.0)
Neutro Abs: 5.5 10*3/uL (ref 1.4–7.7)
Neutrophils Relative %: 62.8 % (ref 43.0–77.0)
Platelets: 214 10*3/uL (ref 150.0–400.0)
RBC: 5.12 Mil/uL — ABNORMAL HIGH (ref 3.87–5.11)
RDW: 14.4 % (ref 11.5–15.5)
WBC: 8.7 10*3/uL (ref 4.0–10.5)

## 2022-09-24 LAB — COMPREHENSIVE METABOLIC PANEL
ALT: 12 U/L (ref 0–35)
AST: 16 U/L (ref 0–37)
Albumin: 3.9 g/dL (ref 3.5–5.2)
Alkaline Phosphatase: 75 U/L (ref 39–117)
BUN: 36 mg/dL — ABNORMAL HIGH (ref 6–23)
CO2: 30 mEq/L (ref 19–32)
Calcium: 10.1 mg/dL (ref 8.4–10.5)
Chloride: 102 mEq/L (ref 96–112)
Creatinine, Ser: 2.38 mg/dL — ABNORMAL HIGH (ref 0.40–1.20)
GFR: 21.24 mL/min — ABNORMAL LOW (ref >60.00)
Glucose, Bld: 112 mg/dL — ABNORMAL HIGH (ref 70–99)
Potassium: 4.9 mEq/L (ref 3.5–5.1)
Sodium: 141 mEq/L (ref 135–145)
Total Bilirubin: 0.5 mg/dL (ref 0.2–1.2)
Total Protein: 7.1 g/dL (ref 6.0–8.3)

## 2022-09-24 LAB — PROTIME-INR
INR: 1 ratio (ref 0.8–1.0)
Prothrombin Time: 10.8 s (ref 9.6–13.1)

## 2022-09-24 NOTE — Assessment & Plan Note (Addendum)
Lab Results  Component Value Date   HGBA1C 9.1 (A) 09/23/2022   Worsening glycemic control.  She had follow-up with endocrine yesterday.  Patient will start Omnipod, increase of basal insulin.  Discussed risk of infection, poor wound healing in the setting of poor glycemic control with upcoming surgery.  Labs will be obtained today.  She has preop cardiac evaluation with Dr Sandie Ano 09/26/2022. If labs stable, will provide surgical clearance from primary ; she will need cardiac clearance as well from Dr Sandie Ano to proceed.

## 2022-09-24 NOTE — Progress Notes (Signed)
Assessment & Plan:  Diabetic ulcer of left midfoot associated with type 2 diabetes mellitus, with fat layer exposed Assessment & Plan: Lab Results  Component Value Date   HGBA1C 9.1 (A) 09/23/2022   Worsening glycemic control.  She had follow-up with endocrine yesterday.  Patient will start Omnipod, increase of basal insulin.  Discussed risk of infection, poor wound healing in the setting of poor glycemic control with upcoming surgery.  Labs will be obtained today.  She has preop cardiac evaluation with Dr Sandie Ano 09/26/2022. If labs stable, will provide surgical clearance from primary ; she will need cardiac clearance as well from Dr Sandie Ano to proceed.   Orders: -     CBC with Differential/Platelet -     Comprehensive metabolic panel -     Protime-INR     Return precautions given.   Risks, benefits, and alternatives of the medications and treatment plan prescribed today were discussed, and patient expressed understanding.   Education regarding symptom management and diagnosis given to patient on AVS either electronically or printed.  Return in about 3 months (around 12/24/2022).  Rennie Plowman, FNP  Subjective:    Patient ID: Chelsea Barton, female    DOB: [birth year], 62 y.o.   MRN: [id number]  CC: Chelsea Barton is a 62 y.o. female who presents today for follow up.   HPI: Here today for preop visit.  She is scheduled with podiatry, Mcdonald 10/04/2022 for graft application of left diabetic ulcer left midfoot.  Last visit 09/11/2022 notes state that wound continues ulcerate deep to the callus and he recommended considering this operation with debridement and application of skin substitute the status of facilitating further wound healing.  Discussed this will be done as an outpatient.  Temporary wound VAC.       she had follow-up yesterday with endocrine Dr Lonzo Cloud.  Point-of-care A1c 9.1.  Patient to be started on Omnipod due to worsening glycemic control.  Increase basal  insulin to 40 units yesterday Reviewed call  with Cardiology preop for an appointment advised to assess preoperative cardiac risk in person, Cardiac clearance with Dr Sandie Ano scheduled 09/26/22  Feels well today.  No new complaints.  Denies chest pain, shortness of breath or palpitations  Allergies: Patient has no known allergies. Current Outpatient Medications on File Prior to Visit  Medication Sig Dispense Refill   aspirin 81 MG EC tablet Take 1 tablet (81 mg total) by mouth daily. Swallow whole. 30 tablet 12   carvedilol (COREG) 12.5 MG tablet Take 1 tablet (12.5 mg total) by mouth 2 (two) times daily with a meal. Take one tablet by mouth daily with a meal. 180 tablet 1   Continuous Blood Gluc Sensor (DEXCOM G6 SENSOR) MISC PLACE A NEW SENSOR EVERY 10 DAYS. USE TO CHECK BLOOD SUGAR AT LEAST 4 TIMES DAILY 9 each 3   Continuous Blood Gluc Transmit (DEXCOM G6 TRANSMITTER) MISC Inject 1 Device into the skin as directed. Every 10 days 1 each 3   dapagliflozin propanediol (FARXIGA) 10 MG TABS tablet Take 1 tablet (10 mg total) by mouth daily. 90 tablet 0   enalapril (VASOTEC) 20 MG tablet Take 2 tablets (40 mg total) by mouth daily. 180 tablet 3   gentamicin ointment (GARAMYCIN) 0.1 % Apply 1 Application topically daily. 30 g 0   glucose blood (ACCU-CHEK AVIVA PLUS) test strip Used to check blood sugar 4x daily. 200 each 12   hydrALAZINE (APRESOLINE) 10 MG tablet TAKE 1/2 TABLET BY MOUTH THREE TIMES  DAILY AS NEEDED(IF BLOOD PRESSURE IS GREATER THAN 140/90) 30 tablet 1   Insulin Disposable Pump (OMNIPOD 5 G6 INTRO, GEN 5,) KIT 1 Device by Does not apply route every other day. 1 kit 0   Insulin Disposable Pump (OMNIPOD 5 G6 PODS, GEN 5,) MISC 1 Device by Does not apply route every other day. 45 each 3   insulin glargine (LANTUS) 100 UNIT/ML Solostar Pen Inject 40 Units into the skin daily. 45 mL 2   Insulin Pen Needle (EASY TOUCH PEN NEEDLES) 31G X 5 MM MISC Check sugar 3-4 daily 100 each 2   Insulin  Pen Needle 31G X 5 MM MISC 1 Device by Does not apply route in the morning, at noon, in the evening, and at bedtime. 400 each 2   Multiple Vitamin (MULTIVITAMIN) capsule Take 1 capsule by mouth daily.     NIFEdipine (PROCARDIA-XL/NIFEDICAL-XL) 30 MG 24 hr tablet TAKE 1 TABLET(30 MG) BY MOUTH DAILY 30 tablet 12   NOVOLOG FLEXPEN 100 UNIT/ML FlexPen Max daily 40 units 15 mL 0   rosuvastatin (CRESTOR) 10 MG tablet TAKE 1 TABLET(10 MG) BY MOUTH DAILY 90 tablet 3   traMADol (ULTRAM) 50 MG tablet Take 1 tablet (50 mg total) by mouth every 6 (six) hours as needed. 20 tablet 0   Vitamin D, Cholecalciferol, 10 MCG (400 UNIT) CAPS Take 1 capsule by mouth daily.     No current facility-administered medications on file prior to visit.    Review of Systems  Constitutional:  Negative for chills and fever.  Respiratory:  Negative for cough.   Cardiovascular:  Negative for chest pain and palpitations.  Gastrointestinal:  Negative for nausea and vomiting.  Skin:  Positive for wound.      Objective:    BP 128/82   Pulse 70   Temp 97.8 F (36.6 C) (Oral)   Ht 5\' 7"  (1.702 m)   Wt 204 lb 3.2 oz (92.6 kg)   SpO2 96%   BMI 31.98 kg/m  BP Readings from Last 3 Encounters:  09/24/22 128/82  09/23/22 126/78  08/19/22 130/70   Wt Readings from Last 3 Encounters:  09/24/22 204 lb 3.2 oz (92.6 kg)  09/23/22 202 lb (91.6 kg)  08/19/22 202 lb 3.2 oz (91.7 kg)    Physical Exam Vitals reviewed.  Constitutional:      Appearance: She is well-developed.  Eyes:     Conjunctiva/sclera: Conjunctivae normal.  Cardiovascular:     Rate and Rhythm: Normal rate and regular rhythm.     Pulses: Normal pulses.     Heart sounds: Normal heart sounds.  Pulmonary:     Effort: Pulmonary effort is normal.     Breath sounds: Normal breath sounds. No wheezing, rhonchi or rales.  Skin:    General: Skin is warm and dry.     Comments: Left midfoot amputation, callus present.  No purulent discharge.  Nontender   Neurological:     Mental Status: She is alert.  Psychiatric:        Speech: Speech normal.        Behavior: Behavior normal.        Thought Content: Thought content normal.

## 2022-09-26 ENCOUNTER — Ambulatory Visit (INDEPENDENT_AMBULATORY_CARE_PROVIDER_SITE_OTHER): Payer: Medicaid Other

## 2022-09-26 ENCOUNTER — Encounter (INDEPENDENT_AMBULATORY_CARE_PROVIDER_SITE_OTHER): Payer: Self-pay | Admitting: Nurse Practitioner

## 2022-09-26 ENCOUNTER — Encounter
Admission: RE | Admit: 2022-09-26 | Discharge: 2022-09-26 | Disposition: A | Discharge status: Home / Self Care | Payer: Medicaid Other | Source: Ambulatory Visit | Attending: Podiatry | Admitting: Podiatry

## 2022-09-26 ENCOUNTER — Ambulatory Visit (INDEPENDENT_AMBULATORY_CARE_PROVIDER_SITE_OTHER): Payer: Medicaid Other | Admitting: Vascular Surgery

## 2022-09-26 ENCOUNTER — Ambulatory Visit: Payer: Medicaid Other | Attending: Cardiology | Admitting: Cardiology

## 2022-09-26 ENCOUNTER — Encounter: Payer: Self-pay | Admitting: Cardiology

## 2022-09-26 VITALS — BP 120/62 | HR 67 | Ht 65.0 in | Wt 207.8 lb

## 2022-09-26 VITALS — BP 131/78 | HR 68 | Resp 18 | Ht 65.0 in | Wt 209.0 lb

## 2022-09-26 DIAGNOSIS — I739 Peripheral vascular disease, unspecified: Secondary | ICD-10-CM

## 2022-09-26 DIAGNOSIS — I10 Essential (primary) hypertension: Secondary | ICD-10-CM

## 2022-09-26 DIAGNOSIS — I7025 Atherosclerosis of native arteries of other extremities with ulceration: Secondary | ICD-10-CM | POA: Diagnosis not present

## 2022-09-26 DIAGNOSIS — E7800 Pure hypercholesterolemia, unspecified: Secondary | ICD-10-CM

## 2022-09-26 DIAGNOSIS — Z9889 Other specified postprocedural states: Secondary | ICD-10-CM

## 2022-09-26 DIAGNOSIS — I2510 Atherosclerotic heart disease of native coronary artery without angina pectoris: Secondary | ICD-10-CM

## 2022-09-26 DIAGNOSIS — I340 Nonrheumatic mitral (valve) insufficiency: Secondary | ICD-10-CM

## 2022-09-26 DIAGNOSIS — Z01818 Encounter for other preprocedural examination: Secondary | ICD-10-CM

## 2022-09-26 DIAGNOSIS — N184 Chronic kidney disease, stage 4 (severe): Secondary | ICD-10-CM

## 2022-09-26 DIAGNOSIS — Z01810 Encounter for preprocedural cardiovascular examination: Secondary | ICD-10-CM

## 2022-09-26 DIAGNOSIS — E1122 Type 2 diabetes mellitus with diabetic chronic kidney disease: Secondary | ICD-10-CM

## 2022-09-26 DIAGNOSIS — Z794 Long term (current) use of insulin: Secondary | ICD-10-CM

## 2022-09-26 HISTORY — DX: Unspecified visual loss: H54.7

## 2022-09-26 HISTORY — DX: Other specified disease of esophagus: K22.89

## 2022-09-26 HISTORY — DX: Secondary hyperparathyroidism of renal origin: N25.81

## 2022-09-26 HISTORY — DX: Acquired absence of left foot: Z89.432

## 2022-09-26 HISTORY — DX: Type 2 diabetes mellitus without complications: E11.9

## 2022-09-26 HISTORY — DX: Obesity, unspecified: E66.9

## 2022-09-26 HISTORY — DX: Nonrheumatic mitral (valve) insufficiency: I34.0

## 2022-09-26 HISTORY — DX: Peripheral vascular disease, unspecified: I73.9

## 2022-09-26 HISTORY — DX: Atherosclerotic heart disease of native coronary artery without angina pectoris: I25.10

## 2022-09-26 HISTORY — DX: Chronic kidney disease, stage 4 (severe): N18.4

## 2022-09-26 HISTORY — DX: Proteinuria, unspecified: R80.9

## 2022-09-26 HISTORY — DX: Hypertensive heart disease without heart failure: I11.9

## 2022-09-26 NOTE — Progress Notes (Signed)
Cardiology Office Note:    Date:  09/26/2022   ID:  Chelsea EthSheryl D Knupp, DOB 06/25/1960, MRN [id number]  PCP:  Allegra GranaArnett, Margaret G, FNP   [facility] Providers Cardiologist:  Debbe OdeaBrian Agbor-Etang, MD     Referring MD: Allegra GranaArnett, Margaret G, FNP   Chief Complaint  Patient presents with   Follow-up    12 month f/u, Pre-Op clearance(10-04-22)    History of Present Illness:    Chelsea Barton is a 62 y.o. female with a hx of hypertension, hyperlipidemia, mitral regurgitation, diabetes, PAD s/p left transmetatarsal amputation, CKD, former smoker x30 years who presents for preop evaluation.  Previously seen due to moderate MR.  Follows up with vascular surgery for PAD.  Recent lower extremity ultrasound showed significantly left SFA stenosis. intervention being considered by vascular surgery.  Overall she states doing well, denies chest pain, denies shortness of breath.  Blood pressure adequately controlled on current medications.  Compliant with medications as prescribed.  Prior notes Echocardiogram 07/2021 EF 50 to 55%, mild to moderate MR. She had an echocardiogram in 2014 showing EF 45%, moderate mitral valve regurgitation.   Past Medical History:  Diagnosis Date   Chronic kidney disease    Diabetes mellitus    Hyperlipidemia    Hypertension     Past Surgical History:  Procedure Laterality Date   AMPUTATION TOE  04/10/2021   Procedure: AMPUTATION 2nd TOE, left foot;  Surgeon: Felecia ShellingEvans, Brent M, DPM;  Location: [facility];  Service: Podiatry;;   CATARACT EXTRACTION     eye surgery r Right    lense placed   GRAFT APPLICATION Left 12/26/2021   Procedure: GRAFT APPLICATION-left foot;  Surgeon: Edwin CapMcDonald, Adam R, DPM;  Location: [facility];  Service: Podiatry;  Laterality: Left;   I & D EXTREMITY Left 12/26/2021   Procedure: IRRIGATION AND DEBRIDEMENT EXTREMITY-left foot;  Surgeon: Edwin CapMcDonald, Adam R, DPM;  Location: [facility];  Service: Podiatry;  Laterality: Left;   INCISION AND DRAINAGE Left  04/18/2021   Procedure: INCISION AND DRAINAGE;  Surgeon: Edwin CapMcDonald, Adam R, DPM;  Location: [facility];  Service: Podiatry;  Laterality: Left;   INCISION AND DRAINAGE OF WOUND Left 04/10/2021   Procedure: IRRIGATION AND DEBRIDEMENT WOUND;  Surgeon: Felecia ShellingEvans, Brent M, DPM;  Location: [facility];  Service: Podiatry;  Laterality: Left;   PERIPHERAL VASCULAR BALLOON ANGIOPLASTY Left 04/13/2021   Procedure: PERIPHERAL VASCULAR BALLOON ANGIOPLASTY;  Surgeon: Renford DillsSchnier, Gregory G, MD;  Location: [facility];  Service: Cardiovascular;  Laterality: Left;   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   Procedure: TRANSMETATARSAL AMPUTATION;  Surgeon: Candelaria StagersPatel, Kevin P, DPM;  Location: [facility];  Service: Podiatry;  Laterality: Left;    Current Medications: Current Meds  Medication Sig   aspirin 81 MG EC tablet Take 1 tablet (81 mg total) by mouth daily. Swallow whole.   carvedilol (COREG) 12.5 MG tablet Take 1 tablet (12.5 mg total) by mouth 2 (two) times daily with a meal. Take one tablet by mouth daily with a meal.   Continuous Blood Gluc Sensor (DEXCOM G6 SENSOR) MISC PLACE A NEW SENSOR EVERY 10 DAYS. USE TO CHECK BLOOD SUGAR AT LEAST 4 TIMES DAILY   Continuous Blood Gluc Transmit (DEXCOM G6 TRANSMITTER) MISC Inject 1 Device into the skin as directed. Every 10 days   dapagliflozin propanediol (FARXIGA) 10 MG TABS tablet Take 1 tablet (10 mg total) by mouth daily.   enalapril (VASOTEC) 20 MG tablet Take 2 tablets (40 mg total) by mouth daily.   gentamicin ointment (  GARAMYCIN) 0.1 % Apply 1 Application topically daily.   glucose blood (ACCU-CHEK AVIVA PLUS) test strip Used to check blood sugar 4x daily.   hydrALAZINE (APRESOLINE) 10 MG tablet TAKE 1/2 TABLET BY MOUTH THREE TIMES DAILY AS NEEDED(IF BLOOD PRESSURE IS GREATER THAN 140/90)   Insulin Disposable Pump (OMNIPOD 5 G6 INTRO, GEN 5,) KIT 1 Device by Does not apply route every other day.   Insulin Disposable Pump (OMNIPOD 5 G6 PODS, GEN 5,) MISC 1  Device by Does not apply route every other day.   insulin glargine (LANTUS) 100 UNIT/ML Solostar Pen Inject 40 Units into the skin daily.   Insulin Pen Needle (EASY TOUCH PEN NEEDLES) 31G X 5 MM MISC Check sugar 3-4 daily   Insulin Pen Needle 31G X 5 MM MISC 1 Device by Does not apply route in the morning, at noon, in the evening, and at bedtime.   Multiple Vitamin (MULTIVITAMIN) capsule Take 1 capsule by mouth daily.   NIFEdipine (PROCARDIA-XL/NIFEDICAL-XL) 30 MG 24 hr tablet TAKE 1 TABLET(30 MG) BY MOUTH DAILY   NOVOLOG FLEXPEN 100 UNIT/ML FlexPen Max daily 40 units   rosuvastatin (CRESTOR) 10 MG tablet TAKE 1 TABLET(10 MG) BY MOUTH DAILY   traMADol (ULTRAM) 50 MG tablet Take 1 tablet (50 mg total) by mouth every 6 (six) hours as needed.   Vitamin D, Cholecalciferol, 10 MCG (400 UNIT) CAPS Take 1 capsule by mouth daily.     Allergies:   Patient has no known allergies.   Social History   Socioeconomic History   Marital status: Single    Spouse name: Not on file   Number of children: Not on file   Years of education: Not on file   Highest education level: Not on file  Occupational History   Not on file  Tobacco Use   Smoking status: Former    Types: Cigarettes    Quit date: 06/18/2005    Years since quitting: 17.2   Smokeless tobacco: Never  Vaping Use   Vaping Use: Never used  Substance and Sexual Activity   Alcohol use: No   Drug use: No   Sexual activity: Not on file  Other Topics Concern   Not on file  Social History Narrative   Has a great niece that she keeps a lot, 2 yo, '[name]'.  Stays weekends with her.       Has 8 great nieces, 3 nieces, 6 nephews   Social Determinants of Health   Financial Resource Strain: Medium Risk (06/14/2021)   Overall Financial Resource Strain (CARDIA)    Difficulty of Paying Living Expenses: Somewhat hard  Food Insecurity: Food Insecurity Present (05/31/2021)   Hunger Vital Sign    Worried About Running Out of Food in the Last Year:  Sometimes true    Ran Out of Food in the Last Year: Sometimes true  Transportation Needs: No Transportation Needs (08/09/2021)   PRAPARE - Administrator, Civil Service (Medical): No    Lack of Transportation (Non-Medical): No  Physical Activity: Sufficiently Active (07/04/2021)   Exercise Vital Sign    Days of Exercise per Week: 7 days    Minutes of Exercise per Session: 30 min  Stress: Not on file  Social Connections: Not on file     Family History: The patient's family history includes Diabetes in her sister; Kidney disease in her sister. There is no history of Breast cancer or Colon cancer.  ROS:   Please see the history of present illness.  All other systems reviewed and are negative.  EKGs/Labs/Other Studies Reviewed:    The following studies were reviewed today:   EKG:  EKG is  ordered today.  The ekg ordered today demonstrates normal sinus rhythm  Recent Labs: 09/24/2022: ALT 12; BUN 36; Creatinine, Ser 2.38; Hemoglobin 13.5; Platelets 214.0; Potassium 4.9; Sodium 141  Recent Lipid Panel    Component Value Date/Time   CHOL 131 05/29/2022 0924   TRIG 72.0 05/29/2022 0924   HDL 52.70 05/29/2022 0924   CHOLHDL 2 05/29/2022 0924   VLDL 14.4 05/29/2022 0924   LDLCALC 64 05/29/2022 0924   LDLDIRECT 84.9 09/10/2012 1628     Risk Assessment/Calculations:          Physical Exam:    VS:  BP 120/62 (BP Location: Left Arm, Patient Position: Sitting, Cuff Size: Large)   Pulse 67   Ht 5\' 5"  (1.651 m)   Wt 207 lb 12.8 oz (94.3 kg)   SpO2 94%   BMI 34.58 kg/m     Wt Readings from Last 3 Encounters:  09/26/22 207 lb 12.8 oz (94.3 kg)  09/26/22 209 lb (94.8 kg)  09/24/22 204 lb 3.2 oz (92.6 kg)     GEN:  Well nourished, well developed in no acute distress HEENT: Normal NECK: No JVD; No carotid bruits CARDIAC: RRR, no murmurs, rubs, gallops RESPIRATORY:  Clear to auscultation without rales, wheezing or rhonchi  ABDOMEN: Soft, non-tender,  non-distended MUSCULOSKELETAL:  No edema; left metatarsal amputation noted SKIN: Warm and dry NEUROLOGIC:  Alert and oriented x 3 PSYCHIATRIC:  Normal affect   ASSESSMENT:    1. Pre-operative cardiovascular examination   2. Mitral valve insufficiency, unspecified etiology   3. Primary hypertension   4. Pure hypercholesterolemia   5. PAD (peripheral artery disease)    PLAN:    In order of problems listed above:  Preop evaluation, last EF 50 to 55%.  Vascular procedure being planned.  Patient denies chest pain or shortness of breath.  Okay for vascular procedure from a cardiac perspective.  Last EF normal. Mitral regurgitation, repeat echo 07/2021 EF 50 to 55%, mild to moderate MR.  Stable from prior.  Plan repeat echo in 2025. Hypertension, BP controlled.  Continue Coreg 12.5 twice daily, enalapril 40 daily, nifedipine 30 mg daily. Hyperlipidemia, cholesterol controlled, continue Crestor 20 mg daily. PAD, aspirin, Crestor.  Keep appointment with vascular surgery.  Follow-up in 6 months      Medication Adjustments/Labs and Tests Ordered: Current medicines are reviewed at length with the patient today.  Concerns regarding medicines are outlined above.  Orders Placed This Encounter  Procedures   EKG 12-Lead   No orders of the defined types were placed in this encounter.   Patient Instructions  Medication Instructions:   Your physician recommends that you continue on your current medications as directed. Please refer to the Current Medication list given to you today.  *If you need a refill on your cardiac medications before your next appointment, please call your pharmacy*   Lab Work:  None Ordered  If you have labs (blood work) drawn today and your tests are completely normal, you will receive your results only by: MyChart Message (if you have MyChart) OR A paper copy in the mail If you have any lab test that is abnormal or we need to change your treatment, we will call  you to review the results.   Testing/Procedures:  None Ordered   Follow-Up: At [facility], you and your health  needs are our priority.  As part of our continuing mission to provide you with exceptional heart care, we have created designated Provider Care Teams.  These Care Teams include your primary Cardiologist (physician) and Advanced Practice Providers (APPs -  Physician Assistants and Nurse Practitioners) who all work together to provide you with the care you need, when you need it.  We recommend signing up for the patient portal called "MyChart".  Sign up information is provided on this After Visit Summary.  MyChart is used to connect with patients for Virtual Visits (Telemedicine).  Patients are able to view lab/test results, encounter notes, upcoming appointments, etc.  Non-urgent messages can be sent to your provider as well.   To learn more about what you can do with MyChart, go to [email or web address].    Your next appointment:   6 month(s)  Provider:   You may see Debbe Odea, MD or one of the following Advanced Practice Providers on your designated Care Team:   Nicolasa Ducking, NP Eula Listen, PA-C Cadence Fransico Michael, PA-C Charlsie Quest, NP   Signed, Debbe Odea, MD  09/26/2022 12:21 PM     [facility]

## 2022-09-26 NOTE — Patient Instructions (Addendum)
Your procedure is scheduled on:10-04-22 Friday Report to the Registration Desk on the 1st floor of the Medical Mall.Then proceed to the 2nd floor Surgery Desk To find out your arrival time, please call [phone] between 1PM - 3PM on:10-03-22 Thursday If your arrival time is 6:00 am, do not arrive before that time as the Medical Mall entrance doors do not open until 6:00 am.  REMEMBER: Instructions that are not followed completely may result in serious medical risk, up to and including death; or upon the discretion of your surgeon and anesthesiologist your surgery may need to be rescheduled.  Do not eat food OR drink any liquids after midnight the night before surgery.  No gum chewing or hard candies.  In addition, your doctor has ordered for you to drink the provided:  Gatorade G2 Drinking this carbohydrate drink up to two hours before surgery helps to reduce insulin resistance and improve patient outcomes. Please complete drinking 2 hours before scheduled arrival time.  One week prior to surgery: Stop Anti-inflammatories (NSAIDS) such as Advil, Aleve, Ibuprofen, Motrin, Naproxen, Naprosyn and Aspirin based products such as Excedrin, Goody's Powder, BC Powder.You may however, take Tylenol if needed for pain up until the day of surgery.  Stop ANY OVER THE COUNTER supplements/vitamins NOW (09-26-22) until after surgery (Vitamin D and multivitamin)  Stop your dapagliflozin propanediol (FARXIGA) 3 days prior to surgery-Last dose will be on 09-30-22 Monday  Take half of your Lantus Insulin (20 units) the night before surgery and NO Insulin the morning of surgery  TAKE ONLY THESE MEDICATIONS THE MORNING OF SURGERY WITH A SIP OF WATER: -carvedilol (COREG)  -NIFEdipine (PROCARDIA-XL/NIFEDICAL-XL)   Continue your 81 mg Aspirin up until the day prior to surgery-Do NOT take the morning of surgery  Continue your enalapril (VASOTEC) up until the day prior to surgery-Do NOT take the morning of  surgeyr  No Alcohol for 24 hours before or after surgery.  No Smoking including e-cigarettes for 24 hours before surgery.  No chewable tobacco products for at least 6 hours before surgery.  No nicotine patches on the day of surgery.  Do not use any "recreational" drugs for at least a week (preferably 2 weeks) before your surgery.  Please be advised that the combination of cocaine and anesthesia may have negative outcomes, up to and including death. If you test positive for cocaine, your surgery will be cancelled.  On the morning of surgery brush your teeth with toothpaste and water, you may rinse your mouth with mouthwash if you wish. Do not swallow any toothpaste or mouthwash.  Use CHG Soap as directed on instruction sheet.  Do not wear jewelry, make-up, hairpins, clips or nail polish.  Do not wear lotions, powders, or perfumes.   Do not shave body hair from the neck down 48 hours before surgery.  Contact lenses, hearing aids and dentures may not be worn into surgery.  Do not bring valuables to the hospital. [facility] is not responsible for any missing/lost belongings or valuables.   Notify your doctor if there is any change in your medical condition (cold, fever, infection).  Wear comfortable clothing (specific to your surgery type) to the hospital.  After surgery, you can help prevent lung complications by doing breathing exercises.  Take deep breaths and cough every 1-2 hours. Your doctor may order a device called an Incentive Spirometer to help you take deep breaths. When coughing or sneezing, hold a pillow firmly against your incision with both hands. This is called "  splinting." Doing this helps protect your incision. It also decreases belly discomfort.  If you are being admitted to the hospital overnight, leave your suitcase in the car. After surgery it may be brought to your room.  In case of increased patient census, it may be necessary for you, the patient, to  continue your postoperative care in the Same Day Surgery department.  If you are being discharged the day of surgery, you will not be allowed to drive home. You will need a responsible individual to drive you home and stay with you for 24 hours after surgery.   If you are taking public transportation, you will need to have a responsible individual with you.  Please call the Pre-admissions Testing Dept. at [phone] if you have any questions about these instructions.  Surgery Visitation Policy:  Patients having surgery or a procedure may have two visitors.  Children under the age of 27 must have an adult with them who is not the patient.     Preparing for Surgery with CHLORHEXIDINE GLUCONATE (CHG) Soap  Chlorhexidine Gluconate (CHG) Soap  o An antiseptic cleaner that kills germs and bonds with the skin to continue killing germs even after washing  o Used for showering the night before surgery and morning of surgery  Before surgery, you can play an important role by reducing the number of germs on your skin.  CHG (Chlorhexidine gluconate) soap is an antiseptic cleanser which kills germs and bonds with the skin to continue killing germs even after washing.  Please do not use if you have an allergy to CHG or antibacterial soaps. If your skin becomes reddened/irritated stop using the CHG.  1. Shower the NIGHT BEFORE SURGERY and the MORNING OF SURGERY with CHG soap.  2. If you choose to wash your hair, wash your hair first as usual with your normal shampoo.  3. After shampooing, rinse your hair and body thoroughly to remove the shampoo.  4. Use CHG as you would any other liquid soap. You can apply CHG directly to the skin and wash gently with a scrungie or a clean washcloth.  5. Apply the CHG soap to your body only from the neck down. Do not use on open wounds or open sores. Avoid contact with your eyes, ears, mouth, and genitals (private parts). Wash face and genitals (private  parts) with your normal soap.  6. Wash thoroughly, paying special attention to the area where your surgery will be performed.  7. Thoroughly rinse your body with warm water.  8. Do not shower/wash with your normal soap after using and rinsing off the CHG soap.  9. Pat yourself dry with a clean towel.  10. Wear clean pajamas to bed the night before surgery.  12. Place clean sheets on your bed the night of your first shower and do not sleep with pets.  13. Shower again with the CHG soap on the day of surgery prior to arriving at the hospital.  14. Do not apply any deodorants/lotions/powders.  15. Please wear clean clothes to the hospital.  How to Use an Incentive Spirometer An incentive spirometer is a tool that measures how well you are filling your lungs with each breath. Learning to take long, deep breaths using this tool can help you keep your lungs clear and active. This may help to reverse or lessen your chance of developing breathing (pulmonary) problems, especially infection. You may be asked to use a spirometer: After a surgery. If you have a lung  problem or a history of smoking. After a long period of time when you have been unable to move or be active. If the spirometer includes an indicator to show the highest number that you have reached, your health care provider or respiratory therapist will help you set a goal. Keep a log of your progress as told by your health care provider. What are the risks? Breathing too quickly may cause dizziness or cause you to pass out. Take your time so you do not get dizzy or light-headed. If you are in pain, you may need to take pain medicine before doing incentive spirometry. It is harder to take a deep breath if you are having pain. How to use your incentive spirometer  Sit up on the edge of your bed or on a chair. Hold the incentive spirometer so that it is in an upright position. Before you use the spirometer, breathe out  normally. Place the mouthpiece in your mouth. Make sure your lips are closed tightly around it. Breathe in slowly and as deeply as you can through your mouth, causing the piston or the ball to rise toward the top of the chamber. Hold your breath for 3-5 seconds, or for as long as possible. If the spirometer includes a coach indicator, use this to guide you in breathing. Slow down your breathing if the indicator goes above the marked areas. Remove the mouthpiece from your mouth and breathe out normally. The piston or ball will return to the bottom of the chamber. Rest for a few seconds, then repeat the steps 10 or more times. Take your time and take a few normal breaths between deep breaths so that you do not get dizzy or light-headed. Do this every 1-2 hours when you are awake. If the spirometer includes a goal marker to show the highest number you have reached (best effort), use this as a goal to work toward during each repetition. After each set of 10 deep breaths, cough a few times. This will help to make sure that your lungs are clear. If you have an incision on your chest or abdomen from surgery, place a pillow or a rolled-up towel firmly against the incision when you cough. This can help to reduce pain while taking deep breaths and coughing. General tips When you are able to get out of bed: Walk around often. Continue to take deep breaths and cough in order to clear your lungs. Keep using the incentive spirometer until your health care provider says it is okay to stop using it. If you have been in the hospital, you may be told to keep using the spirometer at home. Contact a health care provider if: You are having difficulty using the spirometer. You have trouble using the spirometer as often as instructed. Your pain medicine is not giving enough relief for you to use the spirometer as told. You have a fever. Get help right away if: You develop shortness of breath. You develop a cough  with bloody mucus from the lungs. You have fluid or blood coming from an incision site after you cough. Summary An incentive spirometer is a tool that can help you learn to take long, deep breaths to keep your lungs clear and active. You may be asked to use a spirometer after a surgery, if you have a lung problem or a history of smoking, or if you have been inactive for a long period of time. Use your incentive spirometer as instructed every 1-2 hours while you  are awake. If you have an incision on your chest or abdomen, place a pillow or a rolled-up towel firmly against your incision when you cough. This will help to reduce pain. Get help right away if you have shortness of breath, you cough up bloody mucus, or blood comes from your incision when you cough. This information is not intended to replace advice given to you by your health care provider. Make sure you discuss any questions you have with your health care provider. Document Revised: 08/23/2019 Document Reviewed: 08/23/2019 Elsevier Patient Education  2023 [employer].

## 2022-09-26 NOTE — Progress Notes (Signed)
MRN : [id number]  Chelsea Barton is a 62 y.o. (04/29/1961) female who presents with chief complaint of check circulation.  History of Present Illness:   The patient returns to the office for followup and review of the noninvasive studies.   There have been no interval changes in lower extremity symptoms. No interval shortening of the patient's claudication distance or development of rest pain symptoms. No new ulcers or wounds have occurred since the last visit but they are still dealing with an area of the left foot and she has upcoming surgery for further treatment.  There have been no significant changes to the patient's overall health care.  The patient denies amaurosis fugax or recent TIA symptoms. There are no documented recent neurological changes noted. There is no history of DVT, PE or superficial thrombophlebitis. The patient denies recent episodes of angina or shortness of breath.   ABI Rt= 0.86 and Lt= 0.84 (previous ABI's Rt= 0.87 and Lt= 0.86) Duplex ultrasound of the left lower extremity demonstrates a greater than 70% critical stenosis of the mid SFA.  Peak systolic velocities are 536 cm/s.  When compared to the duplex ultrasound in October 2023 velocities were 358.  Current Meds  Medication Sig   aspirin 81 MG EC tablet Take 1 tablet (81 mg total) by mouth daily. Swallow whole.   carvedilol (COREG) 12.5 MG tablet Take 1 tablet (12.5 mg total) by mouth 2 (two) times daily with a meal. Take one tablet by mouth daily with a meal.   Continuous Blood Gluc Sensor (DEXCOM G6 SENSOR) MISC PLACE A NEW SENSOR EVERY 10 DAYS. USE TO CHECK BLOOD SUGAR AT LEAST 4 TIMES DAILY   Continuous Blood Gluc Transmit (DEXCOM G6 TRANSMITTER) MISC Inject 1 Device into the skin as directed. Every 10 days   dapagliflozin propanediol (FARXIGA) 10 MG TABS tablet Take 1 tablet (10 mg total) by mouth daily.   enalapril (VASOTEC) 20 MG tablet Take 2 tablets (40 mg total) by mouth daily.    gentamicin ointment (GARAMYCIN) 0.1 % Apply 1 Application topically daily.   glucose blood (ACCU-CHEK AVIVA PLUS) test strip Used to check blood sugar 4x daily.   hydrALAZINE (APRESOLINE) 10 MG tablet TAKE 1/2 TABLET BY MOUTH THREE TIMES DAILY AS NEEDED(IF BLOOD PRESSURE IS GREATER THAN 140/90)   Insulin Disposable Pump (OMNIPOD 5 G6 INTRO, GEN 5,) KIT 1 Device by Does not apply route every other day.   Insulin Disposable Pump (OMNIPOD 5 G6 PODS, GEN 5,) MISC 1 Device by Does not apply route every other day.   insulin glargine (LANTUS) 100 UNIT/ML Solostar Pen Inject 40 Units into the skin daily.   Insulin Pen Needle (EASY TOUCH PEN NEEDLES) 31G X 5 MM MISC Check sugar 3-4 daily   Insulin Pen Needle 31G X 5 MM MISC 1 Device by Does not apply route in the morning, at noon, in the evening, and at bedtime.   Multiple Vitamin (MULTIVITAMIN) capsule Take 1 capsule by mouth daily.   NIFEdipine (PROCARDIA-XL/NIFEDICAL-XL) 30 MG 24 hr tablet TAKE 1 TABLET(30 MG) BY MOUTH DAILY   NOVOLOG FLEXPEN 100 UNIT/ML FlexPen Max daily 40 units   rosuvastatin (CRESTOR) 10 MG tablet TAKE 1 TABLET(10 MG) BY MOUTH DAILY   traMADol (ULTRAM) 50 MG tablet Take 1 tablet (50 mg total) by mouth every 6 (six) hours as needed.   Vitamin D, Cholecalciferol, 10 MCG (400 UNIT) CAPS Take 1  capsule by mouth daily.    Past Medical History:  Diagnosis Date   Chronic kidney disease    Diabetes mellitus    Hyperlipidemia    Hypertension     Past Surgical History:  Procedure Laterality Date   AMPUTATION TOE  04/10/2021   Procedure: AMPUTATION 2nd TOE, left foot;  Surgeon: Felecia Shelling, DPM;  Location: [facility];  Service: Podiatry;;   CATARACT EXTRACTION     eye surgery r Right    lense placed   GRAFT APPLICATION Left 12/26/2021   Procedure: GRAFT APPLICATION-left foot;  Surgeon: Edwin Cap, DPM;  Location: [facility];  Service: Podiatry;  Laterality: Left;   I & D EXTREMITY Left 12/26/2021   Procedure: IRRIGATION  AND DEBRIDEMENT EXTREMITY-left foot;  Surgeon: Edwin Cap, DPM;  Location: [facility];  Service: Podiatry;  Laterality: Left;   INCISION AND DRAINAGE Left 04/18/2021   Procedure: INCISION AND DRAINAGE;  Surgeon: Edwin Cap, DPM;  Location: [facility];  Service: Podiatry;  Laterality: Left;   INCISION AND DRAINAGE OF WOUND Left 04/10/2021   Procedure: IRRIGATION AND DEBRIDEMENT WOUND;  Surgeon: Felecia Shelling, DPM;  Location: [facility];  Service: Podiatry;  Laterality: Left;   PERIPHERAL VASCULAR BALLOON ANGIOPLASTY Left 04/13/2021   Procedure: PERIPHERAL VASCULAR BALLOON ANGIOPLASTY;  Surgeon: Renford Dills, MD;  Location: [facility];  Service: Cardiovascular;  Laterality: Left;   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   Procedure: TRANSMETATARSAL AMPUTATION;  Surgeon: Candelaria Stagers, DPM;  Location: [facility];  Service: Podiatry;  Laterality: Left;    Social History Social History   Tobacco Use   Smoking status: Former    Types: Cigarettes    Quit date: 06/18/2005    Years since quitting: 17.2   Smokeless tobacco: Never  Vaping Use   Vaping Use: Never used  Substance Use Topics   Alcohol use: No   Drug use: No    Family History Family History  Problem Relation Age of Onset   Kidney disease Sister    Diabetes Sister        3/5 sisters have DM   Breast cancer Neg Hx    Colon cancer Neg Hx     No Known Allergies   REVIEW OF SYSTEMS (Negative unless checked)  Constitutional: [] Weight loss  [] Fever  [] Chills Cardiac: [] Chest pain   [] Chest pressure   [] Palpitations   [] Shortness of breath when laying flat   [] Shortness of breath with exertion. Vascular:  [x] Pain in legs with walking   [] Pain in legs at rest  [] History of DVT   [] Phlebitis   [] Swelling in legs   [] Varicose veins   [] Non-healing ulcers Pulmonary:   [] Uses home oxygen   [] Productive cough   [] Hemoptysis   [] Wheeze  [] COPD   [] Asthma Neurologic:  [] Dizziness   [] Seizures   [] History of  stroke   [] History of TIA  [] Aphasia   [] Vissual changes   [] Weakness or numbness in arm   [] Weakness or numbness in leg Musculoskeletal:   [] Joint swelling   [] Joint pain   [] Low back pain Hematologic:  [] Easy bruising  [] Easy bleeding   [] Hypercoagulable state   [] Anemic Gastrointestinal:  [] Diarrhea   [] Vomiting  [] Gastroesophageal reflux/heartburn   [] Difficulty swallowing. Genitourinary:  [] Chronic kidney disease   [] Difficult urination  [] Frequent urination   [] Blood in urine Skin:  [] Rashes   [] Ulcers  Psychological:  [] History of anxiety   []  History of major depression.  Physical Examination  Vitals:  09/26/22 0933  BP: 131/78  Pulse: 68  Resp: 18  Weight: 209 lb (94.8 kg)  Height: 5\' 5"  (1.651 m)   Body mass index is 34.78 kg/m. Gen: WD/WN, NAD Head: [place]/AT, No temporalis wasting.  Ear/Nose/Throat: Hearing grossly intact, nares w/o erythema or drainage Eyes: PER, EOMI, sclera nonicteric.  Neck: Supple, no masses.  No bruit or JVD.  Pulmonary:  Good air movement, no audible wheezing, no use of accessory muscles.  Cardiac: RRR, normal S1, S2, no Murmurs. Vascular:  mild trophic changes, + open wounds Vessel Right Left  Radial Palpable Palpable  PT Trace palpable Not Palpable  DP Not Palpable Not Palpable  Gastrointestinal: soft, non-distended. No guarding/no peritoneal signs.  Musculoskeletal: M/S 5/5 throughout.  No visible deformity.  Neurologic: CN 2-12 intact. Pain and light touch intact in extremities.  Symmetrical.  Speech is fluent. Motor exam as listed above. Psychiatric: Judgment intact, Mood & affect appropriate for pt's clinical situation. Dermatologic: No rashes or ulcers noted.  No changes consistent with cellulitis.   CBC Lab Results  Component Value Date   WBC 8.7 09/24/2022   HGB 13.5 09/24/2022   HCT 43.0 09/24/2022   MCV 84.1 09/24/2022   PLT 214.0 09/24/2022    BMET    Component Value Date/Time   NA 141 09/24/2022 1031   NA 137  01/20/2014 0000   K 4.9 09/24/2022 1031   CL 102 09/24/2022 1031   CO2 30 09/24/2022 1031   GLUCOSE 112 (H) 09/24/2022 1031   BUN 36 (H) 09/24/2022 1031   BUN 25 (A) 01/20/2014 0000   CREATININE 2.38 (H) 09/24/2022 1031   CALCIUM 10.1 09/24/2022 1031   GFRNONAA 32 (L) 04/24/2021 0654   GFRAA 53 (L) 11/14/2017 1349   Estimated Creatinine Clearance: 27.5 mL/min (A) (by C-G formula based on SCr of 2.38 mg/dL (H)).  COAG Lab Results  Component Value Date   INR 1.0 09/24/2022   INR 1.3 (H) 04/10/2021    Radiology No results found.   Assessment/Plan 1. Atherosclerosis of native arteries of the extremities with ulceration  Recommend:  The patient has evidence of severe atherosclerotic changes of both lower extremities associated with a nonhealed area of the left foot.  This represents a limb threatening ischemia and places the patient at the risk for left limb loss.  She has had extensive revascularization in the past and attempt to salvage her left lower extremity.  Follow-up imaging now demonstrates a marked progression in a focal area of the left SFA.  This is now reached a critical stenosis and threatens her past reconstruction.  She has advanced renal disease and dealing with a focal stenosis rather than a reocclusion of her entire SFA and popliteal would entail significantly less contrast making it markedly better for the patient.  Patient should undergo angiography of the left lower extremity with the hope for intervention for limb salvage.  The risks and benefits as well as the alternative therapies was discussed in detail with the patient.  All questions were answered.  Patient agrees to proceed with left lower extremity angiography.  The patient will follow up with me in the office after the procedure.   I70.25    Atherosclerotic occlusive disease with ulcer  CPT codes: 40981   stent placement iliac artery 37226   stent placement femoral-popliteal artery 37228   angioplasty  tibial peroneal artery 36247   introduction catheter below diaphragm third order  2. Coronary artery disease involving native heart without angina pectoris, unspecified  vessel or lesion type Continue cardiac and antihypertensive medications as already ordered and reviewed, no changes at this time.  Continue statin as ordered and reviewed, no changes at this time  Nitrates PRN for chest pain  3. Primary hypertension Continue antihypertensive medications as already ordered, these medications have been reviewed and there are no changes at this time.  4. Type 2 diabetes mellitus with stage 4 chronic kidney disease, with long-term current use of insulin Continue hypoglycemic medications as already ordered, these medications have been reviewed and there are no changes at this time.  Hgb A1C to be monitored as already arranged by primary service  5. Stage 4 chronic kidney disease The patient has advanced renal disease.  However, at the present time the patient is not yet on dialysis.  Avoid nephrotoxic medications and dehydration.  Given these above goals then treating this critical lesion now prior to to it becoming a complete pleat reocclusion would entail significantly less contrast and therefore would be much safer and preferable for the patient.  Further plans per nephrology    Levora Dredge, MD  09/26/2022 10:41 AM

## 2022-09-26 NOTE — Patient Instructions (Signed)
Medication Instructions:   Your physician recommends that you continue on your current medications as directed. Please refer to the Current Medication list given to you today.  *If you need a refill on your cardiac medications before your next appointment, please call your pharmacy*   Lab Work:  None Ordered  If you have labs (blood work) drawn today and your tests are completely normal, you will receive your results only by: MyChart Message (if you have MyChart) OR A paper copy in the mail If you have any lab test that is abnormal or we need to change your treatment, we will call you to review the results.   Testing/Procedures:  None Ordered   Follow-Up: At [facility], you and your health needs are our priority.  As part of our continuing mission to provide you with exceptional heart care, we have created designated Provider Care Teams.  These Care Teams include your primary Cardiologist (physician) and Advanced Practice Providers (APPs -  Physician Assistants and Nurse Practitioners) who all work together to provide you with the care you need, when you need it.  We recommend signing up for the patient portal called "MyChart".  Sign up information is provided on this After Visit Summary.  MyChart is used to connect with patients for Virtual Visits (Telemedicine).  Patients are able to view lab/test results, encounter notes, upcoming appointments, etc.  Non-urgent messages can be sent to your provider as well.   To learn more about what you can do with MyChart, go to [email or web address].    Your next appointment:   6 month(s)  Provider:   You may see Brian Agbor-Etang, MD or one of the following Advanced Practice Providers on your designated Care Team:   Christopher Berge, NP Ryan Dunn, PA-C Cadence Furth, PA-C Sheri Hammock, NP 

## 2022-09-26 NOTE — Pre-Procedure Instructions (Signed)
Pt states that her Endocrinologist wants her to start Insulin Pump-Pt states that no one has reached out to her as of yet regarding this pump (this was just talked about with MD on 09-24-22). Insulin Pump will probably not be in place prior to her 10-04-22 surgery with Dr Lilian Kapur. Instructions given for surgery based on current insulin which does NOT include the Insulin Pump

## 2022-09-27 ENCOUNTER — Telehealth: Payer: Self-pay | Admitting: Cardiology

## 2022-09-27 NOTE — Telephone Encounter (Signed)
   Name: Chelsea Barton  DOB: 1960-06-23  MRN: [id number]   Primary Cardiologist: Debbe Odea, MD  Chart reviewed as part of pre-operative protocol coverage. Chelsea Barton was last seen on 09/26/2022 by Dr. Azucena Cecil.  At that time patient was found to be stable to undergo vascular procedure from a cardiac perspective.  She is followed by vascular surgery for her peripheral artery disease.  Therefore, based on ACC/AHA guidelines, the patient would be at acceptable risk for the planned procedure without further cardiovascular testing.   Ideally aspirin should be continued without interruption, however if the bleeding risk is too great, aspirin may be held for 5-7 days prior to surgery. Please resume aspirin post operatively when it is felt to be safe from a bleeding standpoint.    I will route this recommendation to the requesting party via Epic fax function and remove from pre-op pool. Please call with questions.  Carlos Levering, NP 09/27/2022, 12:20 PM

## 2022-09-27 NOTE — Telephone Encounter (Signed)
   [facility] Pre-operative Risk Assessment    Request for surgical clearance:  What type of surgery is being performed?  Wound Debridement with Graft Application    When is this surgery scheduled?  10/04/22   What type of clearance is required (medical clearance vs. Pharmacy clearance to hold med vs. Both)?  Both   Are there any medications that need to be held prior to surgery and how long? Their office is requesting our recommendation regarding medications  Practice name and name of physician performing surgery?  [facility]  Dr. Sharl Ma   What is your office phone number? [phone]    7.   What is your office fax number? [phone]  8.   Anesthesia type (None, local, MAC, general)? Sedation and Block   Rolly Pancake 09/27/2022, 11:32 AM

## 2022-09-30 ENCOUNTER — Telehealth (INDEPENDENT_AMBULATORY_CARE_PROVIDER_SITE_OTHER): Payer: Self-pay

## 2022-09-30 LAB — VAS US ABI WITH/WO TBI
Left ABI: 0.84
Right ABI: 0.86

## 2022-09-30 NOTE — Telephone Encounter (Signed)
Spoke with the patient and she is scheduled with Dr. Gilda Crease per request on 01/21/23 with a  6:45 am arrival time to the [facility]. Pre-procedure instructions were discussed and will be mailed.

## 2022-10-01 ENCOUNTER — Encounter: Payer: Self-pay | Admitting: Podiatry

## 2022-10-01 NOTE — Progress Notes (Signed)
Perioperative / Anesthesia Services  Pre-Admission Testing Clinical Review / Preoperative Anesthesia Consult  Date: 10/02/22  Patient Demographics:  Name: Chelsea Barton DOB:   1960/10/11 MRN:   [id number]  Planned Surgical Procedure(s):    Case: [id number] Date/Time: 10/04/22 0715   Procedures:      GRAFT APPLICATION (Left)     DEBRIDEMENT WOUND (Left)   Anesthesia type: Choice   Pre-op diagnosis: Diabetic ulcer   Location: [facility] OR ROOM 02 / [facility]   Surgeons: Edwin Cap, DPM     NOTE: Available PAT nursing documentation and vital signs have been reviewed. Clinical nursing staff has updated patient's PMH/PSHx, current medication list, and drug allergies/intolerances to ensure comprehensive history available to assist in medical decision making as it pertains to the aforementioned surgical procedure and anticipated anesthetic course. Extensive review of available clinical information personally performed. [place] PMH and PSHx updated with any diagnoses/procedures that  may have been inadvertently omitted during her intake with the pre-admission testing department's nursing staff.  Clinical Discussion:  Chelsea Barton is a 62 y.o. female who is submitted for pre-surgical anesthesia review and clearance prior to her undergoing the above procedure. Patient is a Former Smoker (25 pack years; quit 06/2005). 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  Patient is followed by cardiology Azucena Cecil, MD). She was last seen in the cardiology clinic on 09/26/2022; notes reviewed. At the time of her clinic visit, patient doing well overall from a cardiovascular perspective. Patient denied any chest pain, shortness of  breath, PND, orthopnea, palpitations, significant peripheral edema, weakness, fatigue, vertiginous symptoms, or presyncope/syncope. Patient with a past medical history significant for cardiovascular diagnoses. Documented physical exam was grossly benign, providing no evidence of acute exacerbation and/or decompensation of the patient's known cardiovascular conditions.  Patient with a history of significant PAD.  She underwent PTA and stenting of the LEFT SFA on 04/13/2021 placing a 6 x 200 mm LifeStent with the distal edge just beyond Hunter's canal extending proximally and a 6 x 80 mm LifeStent  to the origin of the SFA.  Stents overlapped by approximately 10 mm.  Most recent TTE was performed on 07/18/2021 revealing a low normal left ventricular systolic function with an EF of 50-55%.  There were no regional wall motion abnormalities.  Moderate LVH observed. Left ventricular diastolic Doppler parameters consistent with abnormal relaxation (G1DD).  Right ventricular size and function normal.  Left atrium mildly dilated.  Small pericardial effusion present.  There was mild to moderate mitral valve regurgitation.  Aortic valve sclerosis/calcification present on exam. All transvalvular gradients were noted to be normal providing no evidence suggestive of valvular stenosis.  ABI/TBI study performed on 09/26/2022 indicating mild BILATERAL lower extremity arterial disease with abnormal TBI's.  Subsequent lower extremity vascular duplex was performed on 09/26/2022 revealing a 75-99% stenosis of the LEFT SFA.    Blood pressure well controlled at 120/62 mmHg on currently prescribed beta-blocker (carvedilol), ACEi (enalapril), vasodilator (hydralazine) and CCB (nifedipine) therapies.  Patient is on rosuvastatin for her HLD diagnosis and ASCVD prevention. T2DM poorly controlled on currently prescribed regimen; last HgbA1c was 9.1% when checked on 09/23/2022.  In the setting of known cardiovascular disease and  concurrent T2DM diagnosis, patient is on a SGLT2i (dapagliflozin) for added cardiovascular and renovascular protection. She does not have an OSAH  diagnosis.  Functional capacity somewhat limited by patient's PAD and other multiple medical comorbidities.  However, with that being said, patient still felt to be able to achieve at least 4 METS of physical activity without experiencing any degree of significant angina/anginal equivalent symptoms.  No changes were made to her medication regimen.  Patient follow-up with outpatient cardiology in 6 months or sooner if needed.  Chelsea Barton is scheduled for an GRAFT APPLICATION (Left); DEBRIDEMENT WOUND (Left) on 10/04/2022 with Dr. Sharl Ma, DPM  Given patient's past medical history significant for cardiovascular diagnoses, presurgical cardiac clearance was sought by the PAT team. Per cardiology, "based ACC/AHA guidelines, the patient's past medical history, and the amount of time since her last clinic visit, this patient would be at an overall ACCEPTABLE risk for the planned procedure without further cardiovascular testing or intervention at this time".   In review of her medication reconciliation, it is noted that patient is currently on prescribed daily antithrombotic therapy.  Given patient's history of extensive peripheral artery disease, surgeon has requested that patient remain on daily low-dose ASA throughout her perioperative course.  Patient denies previous perioperative complications with anesthesia in the past. In review of the available records, it is noted that patient underwent a MAC anesthetic course here at [facility] (ASA II) in 12/2021 without documented complications.      09/26/2022   10:51 AM 09/26/2022    9:33 AM 09/24/2022   10:05 AM  Vitals with BMI  Height 5\' 5"  5\' 5"  5\' 7"   Weight 207 lbs 13 oz 209 lbs 204 lbs 3 oz  BMI 34.58 34.78 31.97  Systolic 120 131 161  Diastolic 62 78 82  Pulse 67 68  70    Providers/Specialists:   NOTE: Primary physician provider listed below. Patient may have been seen by APP or partner within same practice.   PROVIDER ROLE / SPECIALTY LAST OV  Edwin Cap, DPM Podiatry (Surgeon) 09/11/2022  Allegra Grana, FNP Primary Care Provider 09/24/2022  Debbe Odea, MD Cardiology 09/26/2022  Levora Dredge, MD Vascular Surgery 09/26/2022  Mendel Ryder, MD Endocrinology 09/23/2022   Allergies:  Patient has no known allergies.  Current Home Medications:   No current facility-administered medications for this encounter.    aspirin 81 MG EC tablet   carvedilol (COREG) 12.5 MG tablet   Continuous Blood Gluc Sensor (DEXCOM G6 SENSOR) MISC   Continuous Blood Gluc Transmit (DEXCOM G6 TRANSMITTER) MISC   dapagliflozin propanediol (FARXIGA) 10 MG TABS tablet   enalapril (VASOTEC) 20 MG tablet   glucose blood (ACCU-CHEK AVIVA PLUS) test strip   hydrALAZINE (APRESOLINE) 10 MG tablet   Insulin Disposable Pump (OMNIPOD 5 G6 INTRO, GEN 5,) KIT   Insulin Disposable Pump (OMNIPOD 5 G6 PODS, GEN 5,) MISC   insulin glargine (LANTUS) 100 UNIT/ML Solostar Pen   Insulin Pen Needle (EASY TOUCH PEN NEEDLES) 31G X 5 MM MISC   Insulin Pen Needle 31G X 5 MM MISC   Multiple Vitamin (MULTIVITAMIN) capsule   NIFEdipine (PROCARDIA-XL/NIFEDICAL-XL) 30 MG 24 hr tablet   NOVOLOG FLEXPEN 100 UNIT/ML FlexPen   rosuvastatin (CRESTOR) 10 MG tablet   Vitamin D, Cholecalciferol, 10 MCG (400 UNIT) CAPS   History:   Past Medical History:  Diagnosis Date   Aortic atherosclerosis    CKD (chronic kidney disease), stage IV    not on dialysis   COPD (chronic obstructive pulmonary disease)    Coronary artery disease    Diastolic dysfunction  a.) TTE 07/18/2021: EF 50-55%, moderate LVH, mild LAE, mild-mod MR, AoV sclerosis, G1DD   Esophageal thickening    Hyperlipidemia    Hypertension    Impaired vision    LVH (left ventricular hypertrophy) due to  hypertensive disease, without heart failure    Obesity    Osteomyelitis of left foot    a.) s/p LEFT transmetatarsal amputation   PAD (peripheral artery disease)    a.) s/p PTA and stenting of LEFT SFA 04/13/2021 --> 6 x 200 mm LifeStent with distal edge just beyond Hunter's canal extending proximally and a 6 x 80 mm LifeStent to the origin of the SFA (stents overlapped by approximately 10 mm); b.) LE vascular duplex 09/26/2022 --> 75-99% stenosis LEFT SFA.   Proteinuria    Secondary hyperparathyroidism of renal origin    Type 2 diabetes mellitus treated with insulin    a.) uses Dexcom CGM and OmniPod insulin pump   Past Surgical History:  Procedure Laterality Date   AMPUTATION TOE  04/10/2021   Procedure: AMPUTATION 2nd TOE, left foot;  Surgeon: Felecia Shelling, DPM;  Location: [facility] ORS;  Service: Podiatry;;   CATARACT EXTRACTION     eye surgery r Right    lense placed   GRAFT APPLICATION Left 12/26/2021   Procedure: GRAFT APPLICATION-left foot;  Surgeon: Edwin Cap, DPM;  Location: [facility] ORS;  Service: Podiatry;  Laterality: Left;   I & D EXTREMITY Left 12/26/2021   Procedure: IRRIGATION AND DEBRIDEMENT EXTREMITY-left foot;  Surgeon: Edwin Cap, DPM;  Location: [facility] ORS;  Service: Podiatry;  Laterality: Left;   INCISION AND DRAINAGE Left 04/18/2021   Procedure: INCISION AND DRAINAGE;  Surgeon: Edwin Cap, DPM;  Location: [facility] ORS;  Service: Podiatry;  Laterality: Left;   INCISION AND DRAINAGE OF WOUND Left 04/10/2021   Procedure: IRRIGATION AND DEBRIDEMENT WOUND;  Surgeon: Felecia Shelling, DPM;  Location: [facility] ORS;  Service: Podiatry;  Laterality: Left;   PERIPHERAL VASCULAR BALLOON ANGIOPLASTY Left 04/13/2021   Procedure: PERIPHERAL VASCULAR BALLOON ANGIOPLASTY;  Surgeon: Renford Dills, MD;  Location: [facility];  Service: Cardiovascular;  Laterality: Left;   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   Procedure: TRANSMETATARSAL AMPUTATION;  Surgeon:  Candelaria Stagers, DPM;  Location: [facility] ORS;  Service: Podiatry;  Laterality: Left;   Family History  Problem Relation Age of Onset   Kidney disease Sister    Diabetes Sister        16/5 sisters have DM   Breast cancer Neg Hx    Colon cancer Neg Hx    Social History   Tobacco Use   Smoking status: Former    Packs/day: 1.00    Years: 25.00    Additional pack years: 0.00    Total pack years: 25.00    Types: Cigarettes    Quit date: 06/18/2005    Years since quitting: 17.3   Smokeless tobacco: Never  Vaping Use   Vaping Use: Never used  Substance Use Topics   Alcohol use: No   Drug use: No    Pertinent Clinical Results:  LABS:   Lab Results  Component Value Date   WBC 8.7 09/24/2022   HGB 13.5 09/24/2022   HCT 43.0 09/24/2022   MCV 84.1 09/24/2022   PLT 214.0 09/24/2022   Lab Results  Component Value Date   NA 141 09/24/2022   K 4.9 09/24/2022   CO2 30 09/24/2022   GLUCOSE 112 (H) 09/24/2022   BUN 36 (H) 09/24/2022  CREATININE 2.38 (H) 09/24/2022   CALCIUM 10.1 09/24/2022   GFRNONAA 32 (L) 04/24/2021   Lab Results  Component Value Date   HGBA1C 9.1 (A) 09/23/2022    ECG: Date: 09/26/2022 Time ECG obtained: 1056 AM Rate: 67 bpm Rhythm:  Normal sinus rhythm Axis (leads I and aVF): Normal Intervals: PR 156 ms. QRS 92 ms. QTc 456 ms. ST segment and T wave changes: Lateral T wave abnormalities (TWIs V5-V6, I, aVL).  Evidence of possible age undetermined inferior and anterior infarcts present. Comparison: Similar to previous tracing obtained on 12/21/2021   IMAGING / PROCEDURES: VAS [name] ABI WITH/WO TBI performed on 09/26/2022 Resting right ankle-brachial index indicates mild right lower extremity arterial disease. The right toe-brachial index is abnormal.  Resting left ankle-brachial index indicates mild left lower extremity arterial disease.   CT CHEST LUNG CA SCREEN LOW DOSE W/O CM performed on 09/18/2021 Lung-RADS 2S, benign appearance or behavior. Continue  annual screening with low-dose chest CT without contrast in 12 months. The "S" modifier above refers to potentially clinically significant non lung cancer related findings. Specifically, there is aortic atherosclerosis, in addition to left main and three-vessel coronary artery disease. Please note that although the presence of coronary artery calcium documents the presence of coronary artery disease, the severity of this disease and any potential stenosis cannot be assessed on this non-gated CT examination. Assessment for potential risk factor modification, dietary therapy or pharmacologic therapy may be warranted, if clinically indicated. Mild diffuse bronchial wall thickening with very mild centrilobular and paraseptal emphysema; imaging findings suggestive of underlying COPD. There are calcifications of the aortic valve and mitral annulus. Echocardiographic correlation for evaluation of potential valvular dysfunction may be warranted if clinically indicated. Aortic atherosclerosis   TRANSTHORACIC ECHOCARDIOGRAM performed on 07/18/2021 Left ventricular ejection fraction, by estimation, is 50 to 55%. The left ventricle has low normal function. The left ventricle has no regional wall motion abnormalities. There is moderate left ventricular hypertrophy. Left ventricular diastolic  parameters are consistent with Grade I diastolic dysfunction (impaired  relaxation).  Right ventricular systolic function is normal. The right ventricular size is normal.  Left atrial size was mildly dilated.  A small pericardial effusion is present.  The mitral valve is normal in structure. Mild to moderate mitral valve regurgitation.  The aortic valve is tricuspid. Aortic valve regurgitation is not visualized. Aortic valve sclerosis/calcification is present, without any evidence of aortic stenosis.  The inferior vena cava is normal in size with greater than 50% respiratory variability, suggesting right atrial pressure of 3  mmHg.   Impression and Plan:  Chelsea Barton has been referred for pre-anesthesia review and clearance prior to her undergoing the planned anesthetic and procedural courses. Available labs, pertinent testing, and imaging results were personally reviewed by me in preparation for upcoming operative/procedural course. [facility] medical record has been updated following extensive record review and patient interview with PAT staff.   This patient has been appropriately cleared by cardiology with an overall ACCEPTABLE risk of significant perioperative cardiovascular complications. Based on clinical review performed today (10/02/22), barring any significant acute changes in the patient's overall condition, it is anticipated that she will be able to proceed with the planned surgical intervention. Any acute changes in clinical condition may necessitate her procedure being postponed and/or cancelled. Patient will meet with anesthesia team (MD and/or CRNA) on the day of her procedure for preoperative evaluation/assessment. Questions regarding anesthetic course will be fielded at that time.   Pre-surgical instructions were reviewed with  the patient during her PAT appointment, and questions were fielded to satisfaction by PAT clinical staff. She has been instructed on which medications that she will need to hold prior to surgery, as well as the ones that have been deemed safe/appropriate to take of the day of her procedure. As part of the general education provided by PAT, patient made aware both verbally and in writing, that she would need to abstain from the use of any illegal substances during her perioperative course.  She was advised that failure to follow the provided instructions could necessitate case cancellation or result serious perioperative complications up to and including death. Patient encouraged to contact PAT and/or her surgeon's office to discuss any questions or concerns that may arise prior to  surgery; verbalized understanding.   Quentin Mulling, MSN, APRN, FNP-C, CEN [facility]  Peri-operative Services Nurse Practitioner Phone: [phone] Fax: [phone] 10/02/22 9:35 AM  NOTE: This note has been prepared using Dragon dictation software. Despite my best ability to proofread, there is always the potential that unintentional transcriptional errors may still occur from this process.

## 2022-10-02 ENCOUNTER — Encounter: Payer: Self-pay | Admitting: Podiatry

## 2022-10-04 ENCOUNTER — Ambulatory Visit: Payer: Medicaid Other | Admitting: Urgent Care

## 2022-10-04 ENCOUNTER — Other Ambulatory Visit: Payer: Self-pay

## 2022-10-04 ENCOUNTER — Encounter
Admission: RE | Disposition: A | Discharge status: Home / Self Care | Payer: Self-pay | Source: Home / Self Care | Attending: Podiatry

## 2022-10-04 ENCOUNTER — Encounter: Payer: Self-pay | Admitting: Podiatry

## 2022-10-04 ENCOUNTER — Ambulatory Visit
Admission: RE | Admit: 2022-10-04 | Discharge: 2022-10-04 | Disposition: A | Discharge status: Home / Self Care | Payer: Medicaid Other | Attending: Podiatry | Admitting: Podiatry

## 2022-10-04 ENCOUNTER — Telehealth: Payer: Self-pay

## 2022-10-04 DIAGNOSIS — N2581 Secondary hyperparathyroidism of renal origin: Secondary | ICD-10-CM | POA: Diagnosis not present

## 2022-10-04 DIAGNOSIS — I129 Hypertensive chronic kidney disease with stage 1 through stage 4 chronic kidney disease, or unspecified chronic kidney disease: Secondary | ICD-10-CM | POA: Insufficient documentation

## 2022-10-04 DIAGNOSIS — L97529 Non-pressure chronic ulcer of other part of left foot with unspecified severity: Secondary | ICD-10-CM | POA: Diagnosis not present

## 2022-10-04 DIAGNOSIS — Z89432 Acquired absence of left foot: Secondary | ICD-10-CM | POA: Diagnosis not present

## 2022-10-04 DIAGNOSIS — Z87891 Personal history of nicotine dependence: Secondary | ICD-10-CM | POA: Diagnosis not present

## 2022-10-04 DIAGNOSIS — Z01818 Encounter for other preprocedural examination: Secondary | ICD-10-CM

## 2022-10-04 DIAGNOSIS — Z794 Long term (current) use of insulin: Secondary | ICD-10-CM | POA: Diagnosis not present

## 2022-10-04 DIAGNOSIS — E11621 Type 2 diabetes mellitus with foot ulcer: Secondary | ICD-10-CM | POA: Diagnosis not present

## 2022-10-04 DIAGNOSIS — L97422 Non-pressure chronic ulcer of left heel and midfoot with fat layer exposed: Secondary | ICD-10-CM | POA: Diagnosis not present

## 2022-10-04 DIAGNOSIS — E1140 Type 2 diabetes mellitus with diabetic neuropathy, unspecified: Secondary | ICD-10-CM | POA: Diagnosis not present

## 2022-10-04 DIAGNOSIS — N184 Chronic kidney disease, stage 4 (severe): Secondary | ICD-10-CM | POA: Diagnosis not present

## 2022-10-04 DIAGNOSIS — E1151 Type 2 diabetes mellitus with diabetic peripheral angiopathy without gangrene: Secondary | ICD-10-CM | POA: Insufficient documentation

## 2022-10-04 DIAGNOSIS — Z992 Dependence on renal dialysis: Secondary | ICD-10-CM | POA: Diagnosis not present

## 2022-10-04 DIAGNOSIS — I2510 Atherosclerotic heart disease of native coronary artery without angina pectoris: Secondary | ICD-10-CM | POA: Diagnosis not present

## 2022-10-04 DIAGNOSIS — E785 Hyperlipidemia, unspecified: Secondary | ICD-10-CM | POA: Insufficient documentation

## 2022-10-04 DIAGNOSIS — Z09 Encounter for follow-up examination after completed treatment for conditions other than malignant neoplasm: Secondary | ICD-10-CM | POA: Diagnosis not present

## 2022-10-04 DIAGNOSIS — E1122 Type 2 diabetes mellitus with diabetic chronic kidney disease: Secondary | ICD-10-CM | POA: Insufficient documentation

## 2022-10-04 HISTORY — PX: WOUND DEBRIDEMENT: SHX247

## 2022-10-04 HISTORY — DX: Osteomyelitis, unspecified: M86.9

## 2022-10-04 HISTORY — PX: GRAFT APPLICATION: SHX6696

## 2022-10-04 HISTORY — DX: Other ill-defined heart diseases: I51.89

## 2022-10-04 HISTORY — DX: Type 2 diabetes mellitus without complications: E11.9

## 2022-10-04 HISTORY — DX: Atherosclerosis of aorta: I70.0

## 2022-10-04 HISTORY — DX: Type 2 diabetes mellitus without complications: Z79.4

## 2022-10-04 HISTORY — DX: Proteinuria, unspecified: R80.9

## 2022-10-04 HISTORY — DX: Chronic obstructive pulmonary disease, unspecified: J44.9

## 2022-10-04 LAB — GLUCOSE, CAPILLARY
Glucose-Capillary: 285 mg/dL — ABNORMAL HIGH (ref 70–99)
Glucose-Capillary: 314 mg/dL — ABNORMAL HIGH (ref 70–99)

## 2022-10-04 SURGERY — GRAFT APPLICATION
Anesthesia: Monitor Anesthesia Care | Laterality: Left

## 2022-10-04 MED ORDER — LIDOCAINE HCL (PF) 2 % IJ SOLN
INTRAMUSCULAR | Status: AC
  Filled 2022-10-04: qty 5

## 2022-10-04 MED ORDER — VANCOMYCIN HCL 1000 MG IV SOLR
INTRAVENOUS | Status: AC
  Filled 2022-10-04: qty 20

## 2022-10-04 MED ORDER — CEFAZOLIN SODIUM-DEXTROSE 2-4 GM/100ML-% IV SOLN
2.0000 g | INTRAVENOUS | Status: AC
  Administered 2022-10-04: 2 g via INTRAVENOUS

## 2022-10-04 MED ORDER — ORAL CARE MOUTH RINSE: 15.0000 mL | Freq: Once | OROMUCOSAL | Status: AC

## 2022-10-04 MED ORDER — BUPIVACAINE HCL (PF) 0.5 % IJ SOLN
INTRAMUSCULAR | Status: AC
  Filled 2022-10-04: qty 30

## 2022-10-04 MED ORDER — ONDANSETRON HCL 4 MG/2ML IJ SOLN
INTRAMUSCULAR | Status: DC | PRN
  Administered 2022-10-04: 4 mg via INTRAVENOUS

## 2022-10-04 MED ORDER — BUPIVACAINE HCL 0.5 % IJ SOLN
INTRAMUSCULAR | Status: DC | PRN
  Administered 2022-10-04: 10 mL

## 2022-10-04 MED ORDER — FAMOTIDINE 20 MG PO TABS
ORAL_TABLET | ORAL | Status: AC
  Filled 2022-10-04: qty 1

## 2022-10-04 MED ORDER — ACETAMINOPHEN 10 MG/ML IV SOLN
INTRAVENOUS | Status: DC | PRN
  Administered 2022-10-04: 1000 mg via INTRAVENOUS

## 2022-10-04 MED ORDER — PROPOFOL 1000 MG/100ML IV EMUL
INTRAVENOUS | Status: AC
  Filled 2022-10-04: qty 100

## 2022-10-04 MED ORDER — MIDAZOLAM HCL 2 MG/2ML IJ SOLN
INTRAMUSCULAR | Status: DC | PRN
  Administered 2022-10-04: 2 mg via INTRAVENOUS

## 2022-10-04 MED ORDER — CHLORHEXIDINE GLUCONATE 0.12 % MT SOLN
15.0000 mL | Freq: Once | OROMUCOSAL | Status: AC
  Administered 2022-10-04: 15 mL via OROMUCOSAL

## 2022-10-04 MED ORDER — SODIUM CHLORIDE 0.9 % IV SOLN: INTRAVENOUS | Status: DC

## 2022-10-04 MED ORDER — FENTANYL CITRATE (PF) 100 MCG/2ML IJ SOLN
INTRAMUSCULAR | Status: DC | PRN
  Administered 2022-10-04: 25 ug via INTRAVENOUS

## 2022-10-04 MED ORDER — PROPOFOL 10 MG/ML IV BOLUS
INTRAVENOUS | Status: AC
  Filled 2022-10-04: qty 20

## 2022-10-04 MED ORDER — MIDAZOLAM HCL 2 MG/2ML IJ SOLN
INTRAMUSCULAR | Status: AC
  Filled 2022-10-04: qty 2

## 2022-10-04 MED ORDER — LIDOCAINE HCL (CARDIAC) PF 100 MG/5ML IV SOSY
PREFILLED_SYRINGE | INTRAVENOUS | Status: DC | PRN
  Administered 2022-10-04: 40 mg via INTRAVENOUS

## 2022-10-04 MED ORDER — 0.9 % SODIUM CHLORIDE (POUR BTL) OPTIME
TOPICAL | Status: DC | PRN
  Administered 2022-10-04: 500 mL

## 2022-10-04 MED ORDER — CEFAZOLIN SODIUM-DEXTROSE 2-4 GM/100ML-% IV SOLN
INTRAVENOUS | Status: AC
  Filled 2022-10-04: qty 100

## 2022-10-04 MED ORDER — FENTANYL CITRATE (PF) 100 MCG/2ML IJ SOLN
INTRAMUSCULAR | Status: AC
  Filled 2022-10-04: qty 2

## 2022-10-04 MED ORDER — SEVOFLURANE IN SOLN
RESPIRATORY_TRACT | Status: AC
  Filled 2022-10-04: qty 250

## 2022-10-04 MED ORDER — TRAMADOL HCL 50 MG PO TABS: 50.0000 mg | ORAL_TABLET | Freq: Four times a day (QID) | ORAL | 0 refills | Status: AC | PRN

## 2022-10-04 MED ORDER — AMOXICILLIN-POT CLAVULANATE 875-125 MG PO TABS: 1.0000 | ORAL_TABLET | Freq: Two times a day (BID) | ORAL | 0 refills | Status: DC

## 2022-10-04 MED ORDER — ACETAMINOPHEN 10 MG/ML IV SOLN
INTRAVENOUS | Status: AC
  Filled 2022-10-04: qty 100

## 2022-10-04 MED ORDER — PROPOFOL 500 MG/50ML IV EMUL
INTRAVENOUS | Status: DC | PRN
  Administered 2022-10-04: 50 ug/kg/min via INTRAVENOUS

## 2022-10-04 MED ORDER — FAMOTIDINE 20 MG PO TABS
20.0000 mg | ORAL_TABLET | Freq: Once | ORAL | Status: AC
  Administered 2022-10-04: 20 mg via ORAL

## 2022-10-04 MED ORDER — FENTANYL CITRATE (PF) 100 MCG/2ML IJ SOLN: 25.0000 ug | INTRAMUSCULAR | Status: DC | PRN

## 2022-10-04 MED ORDER — CHLORHEXIDINE GLUCONATE 0.12 % MT SOLN
OROMUCOSAL | Status: AC
  Filled 2022-10-04: qty 15

## 2022-10-04 SURGICAL SUPPLY — 75 items
APL PRP STRL LF DISP 70% ISPRP (MISCELLANEOUS)
BLADE OSC/SAGITTAL MD 5.5X18 (BLADE) IMPLANT
BLADE OSCILLATING/SAGITTAL (BLADE)
BLADE SURG 15 STRL LF DISP TIS (BLADE) ×1 IMPLANT
BLADE SURG 15 STRL SS (BLADE) ×1
BLADE SW THK.38XMED LNG THN (BLADE) IMPLANT
BNDG CMPR 5X4 CHSV STRCH STRL (GAUZE/BANDAGES/DRESSINGS) ×1
BNDG CMPR 5X6 CHSV STRCH STRL (GAUZE/BANDAGES/DRESSINGS) ×1
BNDG CMPR 75X21 PLY HI ABS (MISCELLANEOUS) ×1
BNDG COHESIVE 4X5 TAN STRL LF (GAUZE/BANDAGES/DRESSINGS) ×1 IMPLANT
BNDG COHESIVE 6X5 TAN ST LF (GAUZE/BANDAGES/DRESSINGS) ×1 IMPLANT
BNDG ELASTIC 4X5.8 VLCR STR LF (GAUZE/BANDAGES/DRESSINGS) ×1 IMPLANT
BNDG ESMARCH 4 X 12 STRL LF (GAUZE/BANDAGES/DRESSINGS)
BNDG ESMARCH 4X12 STRL LF (GAUZE/BANDAGES/DRESSINGS) ×1 IMPLANT
BNDG GAUZE DERMACEA FLUFF 4 (GAUZE/BANDAGES/DRESSINGS) ×1 IMPLANT
BNDG GZE 12X3 1 PLY HI ABS (GAUZE/BANDAGES/DRESSINGS) ×1
BNDG GZE DERMACEA 4 6PLY (GAUZE/BANDAGES/DRESSINGS) ×1
BNDG STRETCH GAUZE 3IN X12FT (GAUZE/BANDAGES/DRESSINGS) ×1 IMPLANT
CANISTER WOUND CARE 500ML ATS (WOUND CARE) ×1 IMPLANT
CHLORAPREP W/TINT 26 (MISCELLANEOUS) ×1 IMPLANT
CUFF TOURN SGL QUICK 12 (TOURNIQUET CUFF) IMPLANT
CUFF TOURN SGL QUICK 18X4 (TOURNIQUET CUFF) IMPLANT
DRAPE FLUOR MINI C-ARM 54X84 (DRAPES) IMPLANT
DRAPE XRAY CASSETTE 23X24 (DRAPES) IMPLANT
DRSG EMULSION OIL 3X3 NADH (GAUZE/BANDAGES/DRESSINGS) IMPLANT
DURAPREP 26ML APPLICATOR (WOUND CARE) ×1 IMPLANT
ELECT REM PT RETURN 9FT ADLT (ELECTROSURGICAL) ×1
ELECTRODE REM PT RTRN 9FT ADLT (ELECTROSURGICAL) ×1 IMPLANT
GAUZE PACKING 0.25INX5YD STRL (GAUZE/BANDAGES/DRESSINGS) ×1 IMPLANT
GAUZE SPONGE 4X4 12PLY STRL (GAUZE/BANDAGES/DRESSINGS) ×1 IMPLANT
GAUZE STRETCH 2X75IN STRL (MISCELLANEOUS) ×1 IMPLANT
GAUZE XEROFORM 1X8 LF (GAUZE/BANDAGES/DRESSINGS) ×1 IMPLANT
GLOVE BIOGEL PI IND STRL 7.5 (GLOVE) ×1 IMPLANT
GLOVE PI ORTHO PRO STRL SZ7 (GLOVE) ×1 IMPLANT
GOWN STRL REUS W/ TWL LRG LVL3 (GOWN DISPOSABLE) ×1 IMPLANT
GOWN STRL REUS W/TWL LRG LVL3 (GOWN DISPOSABLE) ×1
GOWN STRL REUS W/TWL MED LVL3 (GOWN DISPOSABLE) ×1 IMPLANT
GRAFT SKIN WND SURGIBIND 3X7 (Tissue) IMPLANT
HANDPIECE VERSAJET DEBRIDEMENT (MISCELLANEOUS) IMPLANT
IV NS 1000ML (IV SOLUTION)
IV NS 1000ML BAXH (IV SOLUTION) ×1 IMPLANT
KIT DRSG VAC SLVR GRANUFM (MISCELLANEOUS) ×1 IMPLANT
KIT PREVENA INCISION MGT 13 (CANNISTER) IMPLANT
KIT TURNOVER KIT A (KITS) ×1 IMPLANT
LABEL OR SOLS (LABEL) ×1 IMPLANT
MANIFOLD NEPTUNE II (INSTRUMENTS) ×1 IMPLANT
NDL FILTER BLUNT 18X1 1/2 (NEEDLE) ×1 IMPLANT
NDL HYPO 25X1 1.5 SAFETY (NEEDLE) ×2 IMPLANT
NEEDLE FILTER BLUNT 18X1 1/2 (NEEDLE) ×1 IMPLANT
NEEDLE HYPO 25X1 1.5 SAFETY (NEEDLE) ×2 IMPLANT
NS IRRIG 500ML POUR BTL (IV SOLUTION) ×1 IMPLANT
PACK EXTREMITY ARMC (MISCELLANEOUS) ×1 IMPLANT
PACKING GAUZE IODOFORM 1INX5YD (GAUZE/BANDAGES/DRESSINGS) ×1 IMPLANT
PAD ABD DERMACEA PRESS 5X9 (GAUZE/BANDAGES/DRESSINGS) ×1 IMPLANT
PAD PREP 24X41 OB/GYN DISP (PERSONAL CARE ITEMS) ×1 IMPLANT
PULSAVAC PLUS IRRIG FAN TIP (DISPOSABLE) ×1
RASP SM TEAR CROSS CUT (RASP) IMPLANT
SHIELD FULL FACE ANTIFOG 7M (MISCELLANEOUS) ×1 IMPLANT
SOL PREP PVP 2OZ (MISCELLANEOUS) ×1
SOLUTION PREP PVP 2OZ (MISCELLANEOUS) ×1 IMPLANT
STAPLER SKIN PROX 35W (STAPLE) IMPLANT
STOCKINETTE IMPERVIOUS 9X36 MD (GAUZE/BANDAGES/DRESSINGS) ×1 IMPLANT
SUT ETHILON 2 0 FS 18 (SUTURE) ×2 IMPLANT
SUT ETHILON 4-0 (SUTURE) ×1
SUT ETHILON 4-0 FS2 18XMFL BLK (SUTURE) ×1
SUT VIC AB 3-0 SH 27 (SUTURE)
SUT VIC AB 3-0 SH 27X BRD (SUTURE) ×1 IMPLANT
SUT VIC AB 4-0 FS2 27 (SUTURE) ×1 IMPLANT
SUTURE ETHLN 4-0 FS2 18XMF BLK (SUTURE) ×1 IMPLANT
SWAB CULTURE AMIES ANAERIB BLU (MISCELLANEOUS) IMPLANT
SYR 10ML LL (SYRINGE) ×1 IMPLANT
SYR 3ML LL SCALE MARK (SYRINGE) ×1 IMPLANT
TIP FAN IRRIG PULSAVAC PLUS (DISPOSABLE) ×1 IMPLANT
TRAP FLUID SMOKE EVACUATOR (MISCELLANEOUS) ×1 IMPLANT
WATER STERILE IRR 500ML POUR (IV SOLUTION) ×1 IMPLANT

## 2022-10-04 NOTE — Anesthesia Preprocedure Evaluation (Signed)
Anesthesia Evaluation  Patient identified by MRN, date of birth, ID band Patient awake    Reviewed: Allergy & Precautions, H&P , NPO status , Patient's Chart, lab work & pertinent test results, reviewed documented beta blocker date and time   History of Anesthesia Complications Negative for: history of anesthetic complications  Airway Mallampati: III  TM Distance: >3 FB Neck ROM: full    Dental  (+) Dental Advidsory Given, Edentulous Upper, Edentulous Lower   Pulmonary neg shortness of breath, former smoker   Pulmonary exam normal        Cardiovascular Exercise Tolerance: Good hypertension, Pt. on medications and Pt. on home beta blockers (-) angina + CAD and + Peripheral Vascular Disease  (-) Past MI and (-) Cardiac Stents Normal cardiovascular exam(-) dysrhythmias + Valvular Problems/Murmurs ( mitral insufficiency)    LVH   Neuro/Psych  Neuromuscular disease  negative psych ROS   GI/Hepatic negative GI ROS, Neg liver ROS,,,  Endo/Other  diabetes, Poorly Controlled, Insulin Dependent  secondary hyperparathyroidism  Renal/GU CRFRenal disease (CKD stage IV)  negative genitourinary   Musculoskeletal osteomyelitis   Abdominal  (+) + obese  Peds  Hematology  (+) Blood dyscrasia, anemia   Anesthesia Other Findings  Past Medical History: No date: Diabetes mellitus No date: Hyperlipidemia No date: Hypertension  BMI    Body Mass Index: 31.62 kg/m      Reproductive/Obstetrics negative OB ROS                             Anesthesia Physical Anesthesia Plan  ASA: 3  Anesthesia Plan: General   Post-op Pain Management:    Induction: Intravenous  PONV Risk Score and Plan: 3 and TIVA and Propofol infusion  Airway Management Planned: Natural Airway, Nasal Cannula and Simple Face Mask  Additional Equipment:   Intra-op Plan:   Post-operative Plan:   Informed Consent: I have reviewed  the patients History and Physical, chart, labs and discussed the procedure including the risks, benefits and alternatives for the proposed anesthesia with the patient or authorized representative who has indicated his/her understanding and acceptance.     Dental Advisory Given  Plan Discussed with: Anesthesiologist, CRNA and Surgeon  Anesthesia Plan Comments: (Patient consented for risks of anesthesia including but not limited to:  - adverse reactions to medications - risk of airway placement if required - damage to eyes, teeth, lips or other oral mucosa - nerve damage due to positioning  - sore throat or hoarseness - Damage to heart, brain, nerves, lungs, other parts of body or loss of life  Patient voiced understanding.)        Anesthesia Quick Evaluation

## 2022-10-04 NOTE — Anesthesia Procedure Notes (Signed)
Procedure Name: MAC Date/Time: 10/04/2022 7:30 AM  Performed by: Lily Lovings, CRNAPre-anesthesia Checklist: Patient identified, Emergency Drugs available, Suction available, Patient being monitored and Timeout performed Patient Re-evaluated:Patient Re-evaluated prior to induction Oxygen Delivery Method: Simple face mask Preoxygenation: Pre-oxygenation with 100% oxygen Induction Type: IV induction

## 2022-10-04 NOTE — Op Note (Signed)
Patient Name: Chelsea Barton DOB: 1961-02-13  MRN: [id number]   Date of Service: 10/04/2022  Surgeon: Dr. Sharl Ma, DPM Assistants: None Pre-operative Diagnosis:  Diabetic ulcer Post-operative Diagnosis:  Diabetic ulcer Procedures:  Skin substitute application Preparation of wound bed 3 cm x 4 cm Application negative pressure wound therapy Pathology/Specimens: * No specimens in log * Anesthesia: MAC with local Hemostasis: * No tourniquets in log * Estimated Blood Loss: 1 mL Materials:  Implant Name Type Inv. Item Serial No. Manufacturer Lot No. LRB No. Used Action  GRAFT SKIN WND SURGIBIND 3X7 - [id number] Tissue GRAFT SKIN WND SURGIBIND 3X7  [employer] [id number] Left 1 Implanted   Medications: 10 cc of 0.5% Marcaine plain Complications: No complications noted  Indications for Procedure:  This is a 62 y.o. female with a history of uncontrolled type 2 diabetes, PAD with prior transmetatarsal amputation and chronic ulceration of the left foot.  She had persistent ulceration at the amputation site.  Debridement of the wound site and skin substitute application was recommended.   Procedure in Detail: Patient was identified in pre-operative holding area. Formal consent was signed and the left lower extremity was marked. Patient was brought back to the operating room. Anesthesia was induced. The extremity was prepped and draped in the usual sterile fashion. Timeout was taken to confirm patient name, laterality, and procedure prior to incision.   Attention was then directed to the left foot which exhibited a full-thickness ulceration to the subcutaneous layer with a large amount of hyperkeratosis, eschar and fibrous tissue.  Preparation of the wound bed was begun with sharp excisional debridement of the wound site with a #10 blade to remove all hyperkeratosis eschar and fibrous tissue.  Once a fully granular wound bed was achieved the wound was irrigated and hemostasis was achieved.   A Kerecis skin substitute was then selected inset into the wound bed and cut to fit.  It was stable in site with skin staples to secure to the wound bed.  Hydrogel was applied as well as Adaptic.  A Prevena negative pressure wound therapy device was applied. Patient tolerated the procedure well.   Disposition: Following a period of post-operative monitoring, patient will be transferred to home.

## 2022-10-04 NOTE — Discharge Instructions (Addendum)
Post-Surgery Instructions  1. If you are recuperating from surgery anywhere other than home, please be sure to leave [name] a number where you can be reached. 2. Go directly home and rest. 3. The keep operated foot (or feet) elevated six inches above the hip when sitting or lying down. 4. Support the elevated foot and leg with pillows under the calf. DO NOT PLACE PILLOWS UNDER THE KNEE. 5. DO NOT REMOVE or get your bandages wet. This will increase your chances of getting an infection. 6. Wear your surgical shoe at all times when you are up. 7. A limited amount of pain and swelling may occur. The skin may take on a bruised appearance. This is no cause for alarm. 8. For slight pain and swelling, apply an ice pack directly over the bandage for 15 minutes every hour. Continue icing until seen in the office. DO NOT apply any form of heat to the area. 9. Have prescription(s) filled immediately and take as directed. 10. Drink lots of liquids, water, and juice. 11. CALL THE OFFICE IMMEDIATELY IF: a. Bleeding continues b. Pain increases and/or does not respond to medication c. Bandage or cast appears too tight d. Any liquids (water, coffee, etc.) have spilled on your bandages. e. Tripping, falling, or stubbing the surgical foot f. If your temperature rises above 101 g. If you have ANY questions at all 12. Please use the crutches, knee scooter, or walker you have prescribed, rented, or purchased. If you are non-weight bearing DO NOT put weight on the operated foot for _________ days. If you are weight-bearing, follow your physician's instructions. You are expected to be: ? weight-bearing ? non-weight bearing 13. Special Instructions: Focus weight on the heel, instead of walking onto the front of the foot. If you have trouble with the Prevena VAC (loses suction, alarm flashing too much or beeping), call [phone] for assistance.  Keep the tube of surgical lube, we will start using this next week  when we change the dressing  14. Your next appointment is: 10/11/2022 12:30 PM  with Dr Allena Katz   If you need to reach the nurse for any reason, please call: [place]: [phone] [place]: [phone] [place]: [phone]   AMBULATORY SURGERY  DISCHARGE INSTRUCTIONS   The drugs that you were given will stay in your system until tomorrow so for the next 24 hours you should not:  Drive an automobile Make any legal decisions Drink any alcoholic beverage   You may resume regular meals tomorrow.  Today it is better to start with liquids and gradually work up to solid foods.  You may eat anything you prefer, but it is better to start with liquids, then soup and crackers, and gradually work up to solid foods.   Please notify your doctor immediately if you have any unusual bleeding, trouble breathing, redness and pain at the surgery site, drainage, fever, or pain not relieved by medication.    Your post-operative visit with Dr.                                       is: Date:                        Time:    Please call to schedule your post-operative visit.  Additional Instructions:

## 2022-10-04 NOTE — H&P (Signed)
History and Physical Interval Note:  10/04/2022 7:28 AM  Chelsea Barton  has presented today for surgery, with the diagnosis of left foot ulcer.  The various methods of treatment have been discussed with the patient and family. After consideration of risks, benefits and other options for treatment, the patient has consented to   Procedure(s): GRAFT APPLICATION (Left) DEBRIDEMENT WOUND (Left) as a surgical intervention.  The patient's history has been reviewed, patient examined, no change in status, stable for surgery.  I have reviewed the patient's chart and labs.  Questions were answered to the patient's satisfaction.     Edwin Cap

## 2022-10-04 NOTE — Transfer of Care (Signed)
Immediate Anesthesia Transfer of Care Note  Patient: Chelsea Barton  Procedure(s) Performed: GRAFT APPLICATION (Left) DEBRIDEMENT WOUND (Left)  Patient Location: PACU  Anesthesia Type:MAC  Level of Consciousness: sedated, drowsy, and patient cooperative  Airway & Oxygen Therapy: Patient Spontanous Breathing and Patient connected to face mask  Post-op Assessment: Report given to RN and Patient moving all extremities X 4  Post vital signs: Reviewed and stable  Last Vitals:  Vitals Value Taken Time  BP 128/65 10/04/22 0812  Temp 36.6 C 10/04/22 0812  Pulse 65 10/04/22 0815  Resp 10 10/04/22 0815  SpO2 97 % 10/04/22 0815  Vitals shown include unvalidated device data.  Last Pain:  Vitals:   10/04/22 0812  TempSrc:   PainSc: Asleep         Complications: No notable events documented.

## 2022-10-06 ENCOUNTER — Telehealth: Payer: Self-pay

## 2022-10-06 NOTE — Telephone Encounter (Signed)
Patient has received pump supplies and needs to be scheduled for training.

## 2022-10-07 ENCOUNTER — Encounter: Payer: Self-pay | Admitting: Podiatry

## 2022-10-07 NOTE — Anesthesia Postprocedure Evaluation (Signed)
Anesthesia Post Note  Patient: Chelsea Barton  Procedure(s) Performed: GRAFT APPLICATION (Left) DEBRIDEMENT WOUND (Left)  Patient location during evaluation: PACU Anesthesia Type: MAC Level of consciousness: awake and alert Pain management: pain level controlled Vital Signs Assessment: post-procedure vital signs reviewed and stable Respiratory status: spontaneous breathing, nonlabored ventilation, respiratory function stable and patient connected to nasal cannula oxygen Cardiovascular status: blood pressure returned to baseline and stable Postop Assessment: no apparent nausea or vomiting Anesthetic complications: no   No notable events documented.   Last Vitals:  Vitals:   10/04/22 0834 10/04/22 0847  BP: (!) 163/77 (!) 157/83  Pulse: 68 71  Resp: 15 17  Temp: (!) 36.1 C   SpO2: 95%     Last Pain:  Vitals:   10/04/22 0847  TempSrc:   PainSc: 0-No pain                 Lenard Simmer

## 2022-10-11 ENCOUNTER — Ambulatory Visit (INDEPENDENT_AMBULATORY_CARE_PROVIDER_SITE_OTHER): Payer: Medicaid Other | Admitting: Podiatry

## 2022-10-11 DIAGNOSIS — L97421 Non-pressure chronic ulcer of left heel and midfoot limited to breakdown of skin: Secondary | ICD-10-CM | POA: Diagnosis not present

## 2022-10-11 DIAGNOSIS — E13621 Other specified diabetes mellitus with foot ulcer: Secondary | ICD-10-CM | POA: Diagnosis not present

## 2022-10-11 NOTE — Progress Notes (Signed)
Subjective:  Patient ID: Chelsea Barton, female    DOB: Apr 08, 1961,  MRN: [id number]  Chief Complaint  Patient presents with   Routine Post Op    POV # 1 DOS 10/04/22 --- WOUND DEBRIDEMENT AND GRAFT APPLICATION LEFT FOOT      62 y.o. female returns for post-op check.  Patient states she is doing well.  The VAC is functioning well.  Patient still denies nausea there were close vomiting.  On  Review of Systems: Negative except as noted in the HPI. Denies N/V/F/Ch.  Past Medical History:  Diagnosis Date   Aortic atherosclerosis (HCC)    CKD (chronic kidney disease), stage IV (HCC)    not on dialysis   COPD (chronic obstructive pulmonary disease) (HCC)    Coronary artery disease    Diastolic dysfunction    a.) TTE 07/18/2021: EF 50-55%, moderate LVH, mild LAE, mild-mod MR, AoV sclerosis, G1DD   Esophageal thickening    Hyperlipidemia    Hypertension    Impaired vision    LVH (left ventricular hypertrophy) due to hypertensive disease, without heart failure    Obesity    Osteomyelitis of left foot (HCC)    a.) s/p LEFT transmetatarsal amputation   PAD (peripheral artery disease) (HCC)    a.) s/p PTA and stenting of LEFT SFA 04/13/2021 --> 6 x 200 mm LifeStent with distal edge just beyond Hunter's canal extending proximally and a 6 x 80 mm LifeStent to the origin of the SFA (stents overlapped by approximately 10 mm); b.) LE vascular duplex 09/26/2022 --> 75-99% stenosis LEFT SFA.   Proteinuria    Secondary hyperparathyroidism of renal origin (HCC)    Type 2 diabetes mellitus treated with insulin (HCC)    a.) uses Dexcom CGM and OmniPod insulin pump    Current Outpatient Medications:    amoxicillin-clavulanate (AUGMENTIN) 875-125 MG tablet, Take 1 tablet by mouth 2 (two) times daily., Disp: 14 tablet, Rfl: 0   aspirin 81 MG EC tablet, Take 1 tablet (81 mg total) by mouth daily. Swallow whole., Disp: 30 tablet, Rfl: 12   carvedilol (COREG) 12.5 MG tablet, Take 1 tablet (12.5 mg  total) by mouth 2 (two) times daily with a meal. Take one tablet by mouth daily with a meal., Disp: 180 tablet, Rfl: 1   Continuous Blood Gluc Sensor (DEXCOM G6 SENSOR) MISC, PLACE A NEW SENSOR EVERY 10 DAYS. USE TO CHECK BLOOD SUGAR AT LEAST 4 TIMES DAILY, Disp: 9 each, Rfl: 3   Continuous Blood Gluc Transmit (DEXCOM G6 TRANSMITTER) MISC, Inject 1 Device into the skin as directed. Every 10 days, Disp: 1 each, Rfl: 3   dapagliflozin propanediol (FARXIGA) 10 MG TABS tablet, Take 1 tablet (10 mg total) by mouth daily., Disp: 90 tablet, Rfl: 0   enalapril (VASOTEC) 20 MG tablet, Take 2 tablets (40 mg total) by mouth daily. (Patient taking differently: Take 20 mg by mouth 2 (two) times daily.), Disp: 180 tablet, Rfl: 3   glucose blood (ACCU-CHEK AVIVA PLUS) test strip, Used to check blood sugar 4x daily., Disp: 200 each, Rfl: 12   hydrALAZINE (APRESOLINE) 10 MG tablet, TAKE 1/2 TABLET BY MOUTH THREE TIMES DAILY AS NEEDED(IF BLOOD PRESSURE IS GREATER THAN 140/90) (Patient taking differently: Take 5 mg by mouth 3 (three) times daily as needed. TAKE 1/2 TABLET BY MOUTH THREE TIMES DAILY AS NEEDED(IF BLOOD PRESSURE IS GREATER THAN 140/90)), Disp: 30 tablet, Rfl: 1   Insulin Disposable Pump (OMNIPOD 5 G6 INTRO, GEN 5,) KIT, 1 Device  by Does not apply route every other day. (Patient not taking: Reported on 09/26/2022), Disp: 1 kit, Rfl: 0   Insulin Disposable Pump (OMNIPOD 5 G6 PODS, GEN 5,) MISC, 1 Device by Does not apply route every other day., Disp: 45 each, Rfl: 3   insulin glargine (LANTUS) 100 UNIT/ML Solostar Pen, Inject 40 Units into the skin daily. (Patient taking differently: Inject 40 Units into the skin at bedtime.), Disp: 45 mL, Rfl: 2   Insulin Pen Needle (EASY TOUCH PEN NEEDLES) 31G X 5 MM MISC, Check sugar 3-4 daily, Disp: 100 each, Rfl: 2   Insulin Pen Needle 31G X 5 MM MISC, 1 Device by Does not apply route in the morning, at noon, in the evening, and at bedtime., Disp: 400 each, Rfl: 2    Multiple Vitamin (MULTIVITAMIN) capsule, Take 1 capsule by mouth daily., Disp: , Rfl:    NIFEdipine (PROCARDIA-XL/NIFEDICAL-XL) 30 MG 24 hr tablet, TAKE 1 TABLET(30 MG) BY MOUTH DAILY (Patient taking differently: 30 mg every morning.), Disp: 30 tablet, Rfl: 12   NOVOLOG FLEXPEN 100 UNIT/ML FlexPen, Max daily 40 units (Patient taking differently: 5-12 Units 3 (three) times daily before meals. 5 units in AM, 10 units at lunch and 12 units in the evening), Disp: 15 mL, Rfl: 0   rosuvastatin (CRESTOR) 10 MG tablet, TAKE 1 TABLET(10 MG) BY MOUTH DAILY (Patient taking differently: Take 10 mg by mouth at bedtime.), Disp: 90 tablet, Rfl: 3   traMADol (ULTRAM) 50 MG tablet, Take 1 tablet (50 mg total) by mouth every 6 (six) hours as needed., Disp: 20 tablet, Rfl: 0   Vitamin D, Cholecalciferol, 10 MCG (400 UNIT) CAPS, Take 1 capsule by mouth daily., Disp: , Rfl:   Social History   Tobacco Use  Smoking Status Former   Packs/day: 1.00   Years: 25.00   Additional pack years: 0.00   Total pack years: 25.00   Types: Cigarettes   Quit date: 06/18/2005   Years since quitting: 17.3  Smokeless Tobacco Never    No Known Allergies Objective:  There were no vitals filed for this visit. There is no height or weight on file to calculate BMI. Constitutional Well developed. Well nourished.  Vascular Foot warm and well perfused. Capillary refill normal to all digits.   Neurologic Normal speech. Oriented to person, place, and time. Epicritic sensation to light touch grossly present bilaterally.  Dermatologic Skin healing well without signs of infection. Skin edges well coapted without signs of infection.  Graft is incorporating  Orthopedic: Tenderness to palpation noted about the surgical site.   Radiographs: None Assessment:  No diagnosis found. Plan:  Patient was evaluated and treated and all questions answered.  S/p foot surgery rightleft -Progressing as expected post-operatively. -XR: None -WB  Status: Weightbearing as tolerated to the heel -Sutures: Intact.  Graft is in place.  No signs of infection noted.  Appears to be incorporating -Medications: None -Foot redressed.  No follow-ups on file.

## 2022-10-15 ENCOUNTER — Telehealth: Payer: Self-pay

## 2022-10-15 NOTE — Telephone Encounter (Signed)
Pt. Scheduled for next week

## 2022-10-15 NOTE — Telephone Encounter (Signed)
Patient called again to be set up for pump training. She states she has been waiting for almost 2 weeks.

## 2022-10-17 DIAGNOSIS — R809 Proteinuria, unspecified: Secondary | ICD-10-CM | POA: Diagnosis not present

## 2022-10-17 DIAGNOSIS — N184 Chronic kidney disease, stage 4 (severe): Secondary | ICD-10-CM | POA: Diagnosis not present

## 2022-10-17 DIAGNOSIS — N2581 Secondary hyperparathyroidism of renal origin: Secondary | ICD-10-CM | POA: Diagnosis not present

## 2022-10-17 DIAGNOSIS — E1122 Type 2 diabetes mellitus with diabetic chronic kidney disease: Secondary | ICD-10-CM | POA: Diagnosis not present

## 2022-10-17 DIAGNOSIS — I129 Hypertensive chronic kidney disease with stage 1 through stage 4 chronic kidney disease, or unspecified chronic kidney disease: Secondary | ICD-10-CM | POA: Diagnosis not present

## 2022-10-21 ENCOUNTER — Encounter: Payer: Medicaid Other | Attending: Internal Medicine | Admitting: Nutrition

## 2022-10-21 NOTE — Progress Notes (Signed)
Patient is here today to start her OmniPod 5 pump.  She has gotten a new phone which does not support the Dexcom G6 or G7 app.  She has however set up her [employer] central account.  I am not sure whether the PDM will be useful to this patient.  I have sent a message to OmniPod rep. To see if we will still be able to use/download the PDM,  Will call patient back, once I learn this and speak with dr. Lonzo Cloud

## 2022-10-22 ENCOUNTER — Other Ambulatory Visit: Payer: Self-pay | Admitting: Podiatry

## 2022-10-23 ENCOUNTER — Ambulatory Visit (INDEPENDENT_AMBULATORY_CARE_PROVIDER_SITE_OTHER): Payer: Medicaid Other | Admitting: Podiatry

## 2022-10-23 DIAGNOSIS — E13621 Other specified diabetes mellitus with foot ulcer: Secondary | ICD-10-CM | POA: Diagnosis not present

## 2022-10-23 DIAGNOSIS — L97421 Non-pressure chronic ulcer of left heel and midfoot limited to breakdown of skin: Secondary | ICD-10-CM | POA: Diagnosis not present

## 2022-10-23 NOTE — Progress Notes (Signed)
  Subjective:  Patient ID: Chelsea Barton, female    DOB: Jul 08, 1960,  MRN: [id number]  Chief Complaint  Patient presents with   Routine Post Op    POV # 2 DOS 10/04/22 --- WOUND DEBRIDEMENT AND GRAFT APPLICATION LEFT FOOT (diabetic ulcer)      62 y.o. female returns for post-op check.  She returns for follow-up, has not had dressing on, only use the surgical lube twice  Review of Systems: Negative except as noted in the HPI. Denies N/V/F/Ch.   Objective:  There were no vitals filed for this visit. There is no height or weight on file to calculate BMI. Constitutional Well developed. Well nourished.  Vascular Foot warm and well perfused. Capillary refill normal to all digits.  Calf is soft and supple, no posterior calf or knee pain, negative Homans' sign  Neurologic Normal speech. Oriented to person, place, and time. Epicritic sensation to light touch grossly absent bilaterally.  Dermatologic Graft is still coapted with staples.  It appears desiccated  Orthopedic: She has no tenderness to palpation noted about the surgical site.    Assessment:   1. 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  S/p foot surgery left -Unfortunately most of the graft has dislocated.  Remove the staples.  Plan to debride after next visit.  Discussed with her that I would recommend continuing to dress daily with surgical lube and apply a bandage.  Medihoney and silicone border dressing was applied today  Toenails on right foot were debrided in length and thickness as a courtesy today.  No follow-ups on file.

## 2022-10-26 ENCOUNTER — Other Ambulatory Visit: Payer: Self-pay | Admitting: Internal Medicine

## 2022-10-26 DIAGNOSIS — E11319 Type 2 diabetes mellitus with unspecified diabetic retinopathy without macular edema: Secondary | ICD-10-CM

## 2022-10-31 ENCOUNTER — Other Ambulatory Visit: Payer: Self-pay | Admitting: Internal Medicine

## 2022-10-31 ENCOUNTER — Other Ambulatory Visit: Payer: Self-pay | Admitting: Family

## 2022-10-31 DIAGNOSIS — E1039 Type 1 diabetes mellitus with other diabetic ophthalmic complication: Secondary | ICD-10-CM

## 2022-11-06 ENCOUNTER — Encounter: Payer: Self-pay | Admitting: Nutrition

## 2022-11-13 ENCOUNTER — Ambulatory Visit: Payer: Medicaid Other | Admitting: Podiatry

## 2022-11-13 DIAGNOSIS — L97421 Non-pressure chronic ulcer of left heel and midfoot limited to breakdown of skin: Secondary | ICD-10-CM | POA: Diagnosis not present

## 2022-11-13 DIAGNOSIS — E13621 Other specified diabetes mellitus with foot ulcer: Secondary | ICD-10-CM

## 2022-11-15 NOTE — Progress Notes (Signed)
  Subjective:  Patient ID: Chelsea Barton, female    DOB: Oct 30, 1960,  MRN: [id number]  Chief Complaint  Patient presents with   Routine Post Op    POV # 3 DOS 10/04/22 --- WOUND DEBRIDEMENT AND GRAFT APPLICATION LEFT FOOT      62 y.o. female returns for post-op check.  She returns for follow-up, has not had dressing on, only use the surgical lube twice still been using the surgical lube  Review of Systems: Negative except as noted in the HPI. Denies N/V/F/Ch.   Objective:  There were no vitals filed for this visit. There is no height or weight on file to calculate BMI. Constitutional Well developed. Well nourished.  Vascular Foot warm and well perfused. Capillary refill normal to all digits.  Calf is soft and supple, no posterior calf or knee pain, negative Homans' sign  Neurologic Normal speech. Oriented to person, place, and time. Epicritic sensation to light touch grossly absent bilaterally.  Dermatologic Central residual ulceration, significant hyperkeratosis surrounding this  Orthopedic: She has no tenderness to palpation noted about the surgical site.    Assessment:   1. 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  S/p foot surgery left -Wound overall is improved since prior graft application and still has residual ulceration.  Debrided of nonviable tissue and callus today.  Return and follow-up in 4 weeks  Return in about 4 weeks (around 12/11/2022) for wound care.

## 2022-11-18 ENCOUNTER — Telehealth: Payer: Self-pay | Admitting: Nutrition

## 2022-11-18 NOTE — Telephone Encounter (Signed)
Patient was told that without the blood sugar readings going to her phone, the pump will not be able to give extra insulin when the blood sugars go high, or stop the flow of insulin if the blood sugar is dropping low.   She reports that she does not want this pump then if it can not stop the flow of insulin when it drops low and is going to send it back.   I informed her that there are other pumps that will do this without the use of her phone, but she does not want to do this at this time. She says it "is a lot of work".  Her CGM fell off this morning, and she has to call them to get a new one and she does not want all of this trouble at this time.  Patient received the pump from [facility].  I called them at the patient's request to get this sent back to them and left a message as to why the patient is wanting to return this.  I gave them the patient name and number to call patient with what they need to return this.

## 2022-11-19 NOTE — Telephone Encounter (Signed)
Called patient back to confirm that she is indeed on the Lantus and Novolog and doing corrections per sliding scale

## 2022-12-01 ENCOUNTER — Other Ambulatory Visit: Payer: Self-pay | Admitting: Family

## 2022-12-01 DIAGNOSIS — N184 Chronic kidney disease, stage 4 (severe): Secondary | ICD-10-CM

## 2022-12-05 ENCOUNTER — Telehealth: Payer: Self-pay | Admitting: Family

## 2022-12-05 DIAGNOSIS — E1039 Type 1 diabetes mellitus with other diabetic ophthalmic complication: Secondary | ICD-10-CM

## 2022-12-05 NOTE — Telephone Encounter (Signed)
Patient called and her dexcom monitor is not working. She called the company and they tried to reset monitor. They told her she needed a new one, it is out of date. Please prescribe a new one.

## 2022-12-06 MED ORDER — DEXCOM G6 SENSOR MISC
1 refills | Status: DC
Start: 2022-12-06 — End: 2023-07-04

## 2022-12-06 NOTE — Telephone Encounter (Signed)
Sent to [facility] on file

## 2022-12-06 NOTE — Telephone Encounter (Signed)
Spoke to Chelsea Barton and informed her that rx request has been sent to Dr Lonzo Cloud.Chelsea Barton verbalized understanding

## 2022-12-06 NOTE — Addendum Note (Signed)
Addended by: Pollie Meyer on: 12/06/2022 04:19 PM   Modules accepted: Orders

## 2022-12-10 ENCOUNTER — Ambulatory Visit: Payer: Medicaid Other | Admitting: Podiatry

## 2022-12-10 DIAGNOSIS — L97522 Non-pressure chronic ulcer of other part of left foot with fat layer exposed: Secondary | ICD-10-CM

## 2022-12-10 DIAGNOSIS — E0843 Diabetes mellitus due to underlying condition with diabetic autonomic (poly)neuropathy: Secondary | ICD-10-CM

## 2022-12-10 MED ORDER — GENTAMICIN SULFATE 0.1 % EX OINT: 1.0000 | TOPICAL_OINTMENT | Freq: Three times a day (TID) | CUTANEOUS | 1 refills | Status: DC

## 2022-12-10 NOTE — Progress Notes (Signed)
Chief Complaint  Patient presents with   Routine Post Op    POV # 3 DOS 10/04/22 --- WOUND DEBRIDEMENT AND GRAFT APPLICATION LEFT FOOT, patient denies any pain, NO N/V/F/C/SOB, A1c- 9.0 BG- not taking at this time         Subjective:  62 y.o. female with PMHx of diabetes mellitus s/p midfoot amputation labs presenting for follow-up evaluation of an ulcer to the distal portion of the amputation stump.  Routinely seen by Dr. Lilian Kapur here in our office.  She has been applying antibiotic ointment but she has run out of the ointment.  No new complaints   Past Medical History:  Diagnosis Date   Aortic atherosclerosis (HCC)    CKD (chronic kidney disease), stage IV (HCC)    not on dialysis   COPD (chronic obstructive pulmonary disease) (HCC)    Coronary artery disease    Diastolic dysfunction    a.) TTE 07/18/2021: EF 50-55%, moderate LVH, mild LAE, mild-mod MR, AoV sclerosis, G1DD   Esophageal thickening    Hyperlipidemia    Hypertension    Impaired vision    LVH (left ventricular hypertrophy) due to hypertensive disease, without heart failure    Obesity    Osteomyelitis of left foot (HCC)    a.) s/p LEFT transmetatarsal amputation   PAD (peripheral artery disease) (HCC)    a.) s/p PTA and stenting of LEFT SFA 04/13/2021 --> 6 x 200 mm LifeStent with distal edge just beyond Hunter's canal extending proximally and a 6 x 80 mm LifeStent to the origin of the SFA (stents overlapped by approximately 10 mm); b.) LE vascular duplex 09/26/2022 --> 75-99% stenosis LEFT SFA.   Proteinuria    Secondary hyperparathyroidism of renal origin (HCC)    Type 2 diabetes mellitus treated with insulin (HCC)    a.) uses Dexcom CGM and OmniPod insulin pump    Past Surgical History:  Procedure Laterality Date   AMPUTATION TOE  04/10/2021   Procedure: AMPUTATION 2nd TOE, left foot;  Surgeon: Felecia Shelling, DPM;  Location: [facility];  Service: Podiatry;;   CATARACT EXTRACTION     eye surgery r Right     lense placed   GRAFT APPLICATION Left 12/26/2021   Procedure: GRAFT APPLICATION-left foot;  Surgeon: Edwin Cap, DPM;  Location: [facility];  Service: Podiatry;  Laterality: Left;   GRAFT APPLICATION Left 10/04/2022   Procedure: GRAFT APPLICATION;  Surgeon: Edwin Cap, DPM;  Location: [facility];  Service: Podiatry;  Laterality: Left;   I & D EXTREMITY Left 12/26/2021   Procedure: IRRIGATION AND DEBRIDEMENT EXTREMITY-left foot;  Surgeon: Edwin Cap, DPM;  Location: [facility];  Service: Podiatry;  Laterality: Left;   INCISION AND DRAINAGE Left 04/18/2021   Procedure: INCISION AND DRAINAGE;  Surgeon: Edwin Cap, DPM;  Location: [facility];  Service: Podiatry;  Laterality: Left;   INCISION AND DRAINAGE OF WOUND Left 04/10/2021   Procedure: IRRIGATION AND DEBRIDEMENT WOUND;  Surgeon: Felecia Shelling, DPM;  Location: [facility];  Service: Podiatry;  Laterality: Left;   PERIPHERAL VASCULAR BALLOON ANGIOPLASTY Left 04/13/2021   Procedure: PERIPHERAL VASCULAR BALLOON ANGIOPLASTY;  Surgeon: Renford Dills, MD;  Location: [facility];  Service: Cardiovascular;  Laterality: Left;   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   Procedure: TRANSMETATARSAL AMPUTATION;  Surgeon: Candelaria Stagers, DPM;  Location: [facility];  Service: Podiatry;  Laterality: Left;   WOUND DEBRIDEMENT Left 10/04/2022   Procedure: DEBRIDEMENT WOUND;  Surgeon: Edwin Cap,  DPM;  Location: [facility];  Service: Podiatry;  Laterality: Left;    No Known Allergies  LT foot 12/10/2022  Objective/Physical Exam General: The patient is alert and oriented x3 in no acute distress.  Dermatology:  Wound #1 noted to the distal amputation stump measuring approximately 2.0 x 2.5 x 0.2 cm (LxWxD).   To the noted ulceration(s), there is no eschar. There is a moderate amount of slough, fibrin, and necrotic tissue noted. Granulation tissue and wound base is red. There is a minimal amount of serosanguineous drainage  noted. There is no exposed bone muscle-tendon ligament or joint. There is no malodor. Periwound integrity is intact. Skin is warm, dry and supple bilateral lower extremities.  Vascular: VAS [name] ABI W/WO TBI 09/26/2022 ABI Findings:  +---------+------------------+-----+---------+--------+  Right   Rt Pressure (mmHg)IndexWaveform Comment   +---------+------------------+-----+---------+--------+  Brachial 189                                       +---------+------------------+-----+---------+--------+  PTA     162               0.86 triphasic          +---------+------------------+-----+---------+--------+  DP      140               0.74 biphasic           +---------+------------------+-----+---------+--------+  Great Toe111               0.59                    +---------+------------------+-----+---------+--------+   +---------+------------------+-----+----------+----------+  Left    Lt Pressure (mmHg)IndexWaveform  Comment     +---------+------------------+-----+----------+----------+  Brachial 187                                          +---------+------------------+-----+----------+----------+  PTA     158               0.84 monophasic            +---------+------------------+-----+----------+----------+  DP      138               0.73 monophasic            +---------+------------------+-----+----------+----------+  Great Toe                                 amputation  +---------+------------------+-----+----------+----------+   +-------+-----------+-----------+------------+------------+  ABI/TBIToday's ABIToday's TBIPrevious ABIPrevious TBI  +-------+-----------+-----------+------------+------------+  Right 0.86       0.59       0.87        0.66          +-------+-----------+-----------+------------+------------+  Left  0.84       amputation 0.86        amputation     +-------+-----------+-----------+------------+------------+  Bilateral ABIs appear essentially unchanged compared to prior study on  03/28/2022.    Summary:  Right: Resting right ankle-brachial index indicates mild right lower  extremity arterial disease. The right toe-brachial index is abnormal.   Left: Resting left ankle-brachial index indicates mild left lower  extremity arterial disease.    Neurological: Light touch and protective threshold diminished bilaterally.  Musculoskeletal Exam: Midfoot amputation left  Assessment: 1.  Ulcer left foot amputation stump secondary to diabetes mellitus 2. diabetes mellitus w/ peripheral neuropathy   Plan of Care:  1. Patient was evaluated. 2. medically necessary excisional debridement including subcutaneous tissue was performed using a tissue nipper and a chisel blade. Excisional debridement of all the necrotic nonviable tissue down to healthy bleeding viable tissue was performed with post-debridement measurements same as pre-. 3. the wound was cleansed and dry sterile dressing applied. 4.  Prescription for gentamicin ointment.  Apply daily for dressing 5.  Return to clinic 4 weeks with Dr. Perrin Smack, DPM [facility]  Dr. Felecia Shelling, DPM    [address] [place], [place] [address]                Office [phone]  Fax [phone]

## 2022-12-24 ENCOUNTER — Encounter: Payer: Self-pay | Admitting: Family

## 2022-12-24 ENCOUNTER — Ambulatory Visit: Payer: Medicaid Other | Admitting: Family

## 2022-12-24 VITALS — BP 120/70 | HR 62 | Temp 98.1°F | Ht 65.0 in | Wt 202.4 lb

## 2022-12-24 DIAGNOSIS — N184 Chronic kidney disease, stage 4 (severe): Secondary | ICD-10-CM

## 2022-12-24 DIAGNOSIS — I10 Essential (primary) hypertension: Secondary | ICD-10-CM

## 2022-12-24 DIAGNOSIS — E1122 Type 2 diabetes mellitus with diabetic chronic kidney disease: Secondary | ICD-10-CM

## 2022-12-24 DIAGNOSIS — R7309 Other abnormal glucose: Secondary | ICD-10-CM

## 2022-12-24 DIAGNOSIS — Z794 Long term (current) use of insulin: Secondary | ICD-10-CM

## 2022-12-24 DIAGNOSIS — E10621 Type 1 diabetes mellitus with foot ulcer: Secondary | ICD-10-CM

## 2022-12-24 LAB — POCT GLYCOSYLATED HEMOGLOBIN (HGB A1C): Hemoglobin A1C: 8.4 % — AB (ref 4.0–5.6)

## 2022-12-24 MED ORDER — LANCET DEVICE MISC
1.0000 | Freq: Three times a day (TID) | 3 refills | Status: AC
Start: 2022-12-24 — End: 2023-01-23

## 2022-12-24 MED ORDER — DEXCOM G6 RECEIVER DEVI: 0 refills | Status: AC

## 2022-12-24 MED ORDER — BLOOD GLUCOSE TEST VI STRP
1.0000 | ORAL_STRIP | Freq: Three times a day (TID) | 0 refills | Status: AC
Start: 2022-12-24 — End: 2023-01-23

## 2022-12-24 MED ORDER — BLOOD GLUCOSE MONITORING SUPPL DEVI
1.0000 | Freq: Three times a day (TID) | 0 refills | Status: AC
Start: 2022-12-24 — End: ?

## 2022-12-24 MED ORDER — LANCETS MISC. MISC
1.0000 | Freq: Three times a day (TID) | 3 refills | Status: AC
Start: 2022-12-24 — End: 2023-01-23

## 2022-12-24 NOTE — Patient Instructions (Addendum)
   Your next appointment with Dr Lonzo Cloud 03/26/23. Their office will call you in regards to if this can virtual.   I have also placed a referral to [facility] to see your insurance would now cover.

## 2022-12-24 NOTE — Assessment & Plan Note (Signed)
Overall stable.  Slightly elevated today however readings at home 120/70.Continue carvedilol 12.5 mg twice daily, enalapril 40 mg, nifedipine 30mg  QD, Farxiga 10 mg. Hydralazine 1/2 tablet PRN TID, fortunately she has not had to use.

## 2022-12-24 NOTE — Progress Notes (Addendum)
Assessment & Plan:  Type 2 diabetes mellitus with stage 4 chronic kidney disease, with long-term current use of insulin (HCC) Assessment & Plan: Lab Results  Component Value Date   HGBA1C 8.4 (A) 12/24/2022  Improved from prior. She is not wearing Omnipod.  Currently coding under type 2 diabetes.  Patient has follow-up endocrinology and previously endocrine coded under DM2.  She follows endocrinology, Dr Lonzo Cloud however patient expresses difficulty in traveling to [place].  She does not drive.  I placed a referral to Tamsen Snider as patient had previously seen to see if insurance would cover again.  Advised to continue insulin as managed by Dr Lonzo Cloud,  Lantus 40 units daily, NovoLog 5 units of breakfast, 10 units with lunch and 12 units with supper.  We have refilled glucometer and supplies. We also called to confirm patient has follow up with Dr Lonzo Cloud, scheduled 03/26/23.   Orders: -     POCT glycosylated hemoglobin (Hb A1C) -     Dexcom G6 Receiver; Use as directed  Dispense: 1 each; Refill: 0 -     Ambulatory referral to Endocrinology -     Blood Glucose Monitoring Suppl; 1 each by Does not apply route in the morning, at noon, and at bedtime. May substitute to any manufacturer covered by patient's insurance.  Dispense: 1 each; Refill: 0 -     Blood Glucose Test; 1 each by In Vitro route in the morning, at noon, and at bedtime. May substitute to any manufacturer covered by patient's insurance.  Dispense: 100 strip; Refill: 0 -     Lancet Device; 1 each by Does not apply route in the morning, at noon, and at bedtime. May substitute to any manufacturer covered by patient's insurance.  Dispense: 1 each; Refill: 3 -     Lancets Misc.; 1 each by Does not apply route in the morning, at noon, and at bedtime. May substitute to any manufacturer covered by patient's insurance.  Dispense: 100 each; Refill: 3  Elevated glucose -     POCT glycosylated hemoglobin (Hb A1C)  Primary  hypertension Assessment & Plan: Overall stable.  Slightly elevated today however readings at home 120/70.Continue carvedilol 12.5 mg twice daily, enalapril 40 mg, nifedipine 30mg  QD, Farxiga 10 mg. Hydralazine 1/2 tablet PRN TID, fortunately she has not had to use.        Return precautions given.   Risks, benefits, and alternatives of the medications and treatment plan prescribed today were discussed, and patient expressed understanding.   Education regarding symptom management and diagnosis given to patient on AVS either electronically or printed.  No follow-ups on file.  Rennie Plowman, FNP  Subjective:    Patient ID: Rise Patience, female    DOB: September 23, 1960, 62 y.o.   MRN: [id number]  CC: Rio Taber Wint is a 62 y.o. female who presents today for follow up.   HPI: Feels well today.  No new complaints.   she is compliant with carvedilol 12.5 mg twice daily, enalapril 40 mg daily, nifedipine 30 mg daily, Farxiga 10 mg daily.  She had not needed hydralazine .  BP at home 120/70.  She checks blood pressure regularly .   she endorses eating salty dinner last night and thinks contributed.    DM type 1 . Denies hypoglycemic episodes.  She is not checking glucose as she doesn't lancets.    She follows endocrinology, Dr Lonzo Cloud.  Last seen 09/23/2022.  Referred to diabetic nutrition. She is compliant with Lantus 40  units daily, NovoLog 5 units of breakfast, 10 units with lunch and 12 units with supper.  She is not wearing Omnipod as not synced with phone.   Previous A1c 9.1  Allergies: Patient has no known allergies. Current Outpatient Medications on File Prior to Visit  Medication Sig Dispense Refill   aspirin 81 MG EC tablet Take 1 tablet (81 mg total) by mouth daily. Swallow whole. 30 tablet 12   carvedilol (COREG) 12.5 MG tablet Take 1 tablet (12.5 mg total) by mouth 2 (two) times daily with a meal. Take one tablet by mouth daily with a meal. 180 tablet 1   Continuous  Blood Gluc Transmit (DEXCOM G6 TRANSMITTER) MISC Inject 1 Device into the skin as directed. Every 10 days 1 each 3   Continuous Glucose Sensor (DEXCOM G6 SENSOR) MISC PLACE A NEW SENSOR EVERY 10 DAYS. USE TO CHECK BLOOD SUGAR AT LEAST 4 TIMES DAILY 9 each 1   dapagliflozin propanediol (FARXIGA) 10 MG TABS tablet TAKE 1 TABLET(10 MG) BY MOUTH DAILY 90 tablet 3   enalapril (VASOTEC) 20 MG tablet Take 2 tablets (40 mg total) by mouth daily. (Patient taking differently: Take 20 mg by mouth 2 (two) times daily.) 180 tablet 3   gentamicin ointment (GARAMYCIN) 0.1 % Apply 1 Application topically 3 (three) times daily. 30 g 1   glucose blood (ACCU-CHEK AVIVA PLUS) test strip Used to check blood sugar 4x daily. 200 each 12   hydrALAZINE (APRESOLINE) 10 MG tablet TAKE 1/2 TABLET BY MOUTH THREE TIMES DAILY AS NEEDED IF BLOOD PRESSURE IS OVER 140/90 30 tablet 1   Insulin Disposable Pump (OMNIPOD 5 G6 INTRO, GEN 5,) KIT 1 Device by Does not apply route every other day. 1 kit 0   Insulin Disposable Pump (OMNIPOD 5 G6 PODS, GEN 5,) MISC 1 Device by Does not apply route every other day. 45 each 3   insulin glargine (LANTUS SOLOSTAR) 100 UNIT/ML Solostar Pen Inject 40 Units into the skin at bedtime. 30 mL 1   Insulin Pen Needle (EASY TOUCH PEN NEEDLES) 31G X 5 MM MISC Check sugar 3-4 daily 100 each 2   Insulin Pen Needle 31G X 5 MM MISC 1 Device by Does not apply route in the morning, at noon, in the evening, and at bedtime. 400 each 2   Multiple Vitamin (MULTIVITAMIN) capsule Take 1 capsule by mouth daily.     NIFEdipine (PROCARDIA-XL/NIFEDICAL-XL) 30 MG 24 hr tablet TAKE 1 TABLET(30 MG) BY MOUTH DAILY (Patient taking differently: 30 mg every morning.) 30 tablet 12   NOVOLOG FLEXPEN 100 UNIT/ML FlexPen INJECT 40 UNITS DAILY MAX 15 mL 5   rosuvastatin (CRESTOR) 10 MG tablet TAKE 1 TABLET(10 MG) BY MOUTH DAILY (Patient taking differently: Take 10 mg by mouth at bedtime.) 90 tablet 3   traMADol (ULTRAM) 50 MG tablet  Take 1 tablet (50 mg total) by mouth every 6 (six) hours as needed. (Patient not taking: Reported on 01/08/2023) 20 tablet 0   Vitamin D, Cholecalciferol, 10 MCG (400 UNIT) CAPS Take 1 capsule by mouth daily.     amoxicillin-clavulanate (AUGMENTIN) 875-125 MG tablet Take 1 tablet by mouth 2 (two) times daily. (Patient not taking: Reported on 01/08/2023) 14 tablet 0   No current facility-administered medications on file prior to visit.    Review of Systems  Constitutional:  Negative for chills and fever.  Respiratory:  Negative for cough.   Cardiovascular:  Negative for chest pain and palpitations.  Gastrointestinal:  Negative for nausea  and vomiting.      Objective:    BP 120/70   Pulse 62   Temp 98.1 F (36.7 C) (Oral)   Ht 5\' 5"  (1.651 m)   Wt 202 lb 6.4 oz (91.8 kg)   SpO2 97%   BMI 33.68 kg/m  BP Readings from Last 3 Encounters:  01/08/23 (!) 225/100  12/24/22 120/70  10/04/22 (!) 157/83   Wt Readings from Last 3 Encounters:  12/24/22 202 lb 6.4 oz (91.8 kg)  10/04/22 207 lb 14.3 oz (94.3 kg)  09/26/22 207 lb 12.8 oz (94.3 kg)    Physical Exam Vitals reviewed.  Constitutional:      Appearance: She is well-developed.  Eyes:     Conjunctiva/sclera: Conjunctivae normal.  Cardiovascular:     Rate and Rhythm: Normal rate and regular rhythm.     Pulses: Normal pulses.     Heart sounds: Normal heart sounds.  Pulmonary:     Effort: Pulmonary effort is normal.     Breath sounds: Normal breath sounds. No wheezing, rhonchi or rales.  Skin:    General: Skin is warm and dry.  Neurological:     Mental Status: She is alert.  Psychiatric:        Speech: Speech normal.        Behavior: Behavior normal.        Thought Content: Thought content normal.

## 2022-12-24 NOTE — Assessment & Plan Note (Addendum)
Lab Results  Component Value Date   HGBA1C 8.4 (A) 12/24/2022  Improved from prior. She is not wearing Omnipod.  Currently coding under type 2 diabetes.  Patient has follow-up endocrinology and previously endocrine coded under DM2.  She follows endocrinology, Dr Lonzo Cloud however patient expresses difficulty in traveling to [place].  She does not drive.  I placed a referral to Tamsen Snider as patient had previously seen to see if insurance would cover again.  Advised to continue insulin as managed by Dr Lonzo Cloud,  Lantus 40 units daily, NovoLog 5 units of breakfast, 10 units with lunch and 12 units with supper.  We have refilled glucometer and supplies. We also called to confirm patient has follow up with Dr Lonzo Cloud, scheduled 03/26/23.

## 2023-01-08 ENCOUNTER — Encounter: Payer: Self-pay | Admitting: Podiatry

## 2023-01-08 ENCOUNTER — Other Ambulatory Visit: Payer: Self-pay | Admitting: Podiatry

## 2023-01-08 ENCOUNTER — Ambulatory Visit: Payer: Medicaid Other | Admitting: Podiatry

## 2023-01-08 VITALS — BP 225/100 | HR 69

## 2023-01-08 DIAGNOSIS — L97522 Non-pressure chronic ulcer of other part of left foot with fat layer exposed: Secondary | ICD-10-CM

## 2023-01-08 DIAGNOSIS — E0843 Diabetes mellitus due to underlying condition with diabetic autonomic (poly)neuropathy: Secondary | ICD-10-CM | POA: Diagnosis not present

## 2023-01-08 NOTE — Progress Notes (Signed)
  Subjective:  Patient ID: Chelsea Barton, female    DOB: 27-Dec-1960,  MRN: [id number]  Chief Complaint  Patient presents with   Wound Check    "It's doing good.  I need some more pain medication because it hurts sometime."      62 y.o. female returns for post-op check.  She returns for follow-up, has not had dressing on, still occasionally using surgical lube  Review of Systems: Negative except as noted in the HPI. Denies N/V/F/Ch.   Objective:   Vitals:   01/08/23 0818  BP: (!) 225/100  Pulse: 69   There is no height or weight on file to calculate BMI. Constitutional Well developed. Well nourished.  Vascular Foot warm and well perfused. Capillary refill normal to all digits.  Calf is soft and supple, no posterior calf or knee pain, negative Homans' sign  Neurologic Normal speech. Oriented to person, place, and time. Epicritic sensation to light touch grossly absent bilaterally.  Dermatologic Central residual ulceration, significant hyperkeratosis surrounding this  Orthopedic: She has no tenderness to palpation noted about the surgical site.    Assessment:   1. 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 Diabetes mellitus due to underlying condition with diabetic autonomic neuropathy, unspecified whether long term insulin use (HCC)     Plan:  Patient was evaluated and treated and all questions answered.  S/p foot surgery left -Wound appears stable to still have central residual ulceration that is partial-thickness, we discussed long-term this likely will be chronic and complete closure will be difficult without further bony resection.  She has upcoming angiography next week.  If we can control the callus on a regular basis revealing up in the periwound and prevent infection should be able to continue ambulating in shoe gear.  Wound was debrided of nonviable tissue.  A dressing applied.  Continue home wound care and I will see her back in 6 weeks.  Return in  about 6 weeks (around 02/19/2023) for wound care.

## 2023-01-17 ENCOUNTER — Telehealth: Payer: Self-pay

## 2023-01-17 NOTE — Telephone Encounter (Signed)
Allegra Grana, FNP  [facility] Call [facility] and get last eye exam to fulfill HM   E fax has been sent requesting these results

## 2023-01-21 ENCOUNTER — Ambulatory Visit
Admission: RE | Admit: 2023-01-21 | Discharge: 2023-01-21 | Disposition: A | Discharge status: Home / Self Care | Payer: Medicaid Other | Attending: Vascular Surgery | Admitting: Vascular Surgery

## 2023-01-21 ENCOUNTER — Other Ambulatory Visit: Payer: Self-pay

## 2023-01-21 ENCOUNTER — Encounter
Admission: RE | Disposition: A | Discharge status: Home / Self Care | Payer: Self-pay | Source: Home / Self Care | Attending: Vascular Surgery

## 2023-01-21 DIAGNOSIS — I70201 Unspecified atherosclerosis of native arteries of extremities, right leg: Secondary | ICD-10-CM | POA: Diagnosis not present

## 2023-01-21 DIAGNOSIS — Z794 Long term (current) use of insulin: Secondary | ICD-10-CM | POA: Diagnosis not present

## 2023-01-21 DIAGNOSIS — E1122 Type 2 diabetes mellitus with diabetic chronic kidney disease: Secondary | ICD-10-CM | POA: Insufficient documentation

## 2023-01-21 DIAGNOSIS — L97529 Non-pressure chronic ulcer of other part of left foot with unspecified severity: Secondary | ICD-10-CM | POA: Diagnosis not present

## 2023-01-21 DIAGNOSIS — I2510 Atherosclerotic heart disease of native coronary artery without angina pectoris: Secondary | ICD-10-CM | POA: Diagnosis not present

## 2023-01-21 DIAGNOSIS — E1151 Type 2 diabetes mellitus with diabetic peripheral angiopathy without gangrene: Secondary | ICD-10-CM | POA: Diagnosis present

## 2023-01-21 DIAGNOSIS — E11621 Type 2 diabetes mellitus with foot ulcer: Secondary | ICD-10-CM | POA: Insufficient documentation

## 2023-01-21 DIAGNOSIS — I708 Atherosclerosis of other arteries: Secondary | ICD-10-CM | POA: Diagnosis not present

## 2023-01-21 DIAGNOSIS — Z9889 Other specified postprocedural states: Secondary | ICD-10-CM | POA: Diagnosis not present

## 2023-01-21 DIAGNOSIS — T82856A Stenosis of peripheral vascular stent, initial encounter: Secondary | ICD-10-CM

## 2023-01-21 DIAGNOSIS — I129 Hypertensive chronic kidney disease with stage 1 through stage 4 chronic kidney disease, or unspecified chronic kidney disease: Secondary | ICD-10-CM | POA: Diagnosis not present

## 2023-01-21 DIAGNOSIS — L97909 Non-pressure chronic ulcer of unspecified part of unspecified lower leg with unspecified severity: Secondary | ICD-10-CM

## 2023-01-21 DIAGNOSIS — Z87891 Personal history of nicotine dependence: Secondary | ICD-10-CM | POA: Diagnosis not present

## 2023-01-21 DIAGNOSIS — I70245 Atherosclerosis of native arteries of left leg with ulceration of other part of foot: Secondary | ICD-10-CM | POA: Diagnosis not present

## 2023-01-21 DIAGNOSIS — N184 Chronic kidney disease, stage 4 (severe): Secondary | ICD-10-CM | POA: Insufficient documentation

## 2023-01-21 HISTORY — PX: LOWER EXTREMITY ANGIOGRAPHY: CATH118251

## 2023-01-21 LAB — GLUCOSE, CAPILLARY: Glucose-Capillary: 153 mg/dL — ABNORMAL HIGH (ref 70–99)

## 2023-01-21 LAB — CREATININE, SERUM
Creatinine, Ser: 2.31 mg/dL — ABNORMAL HIGH (ref 0.44–1.00)
GFR, Estimated: 23 mL/min — ABNORMAL LOW (ref >60)

## 2023-01-21 LAB — BUN: BUN: 41 mg/dL — ABNORMAL HIGH (ref 8–23)

## 2023-01-21 SURGERY — LOWER EXTREMITY ANGIOGRAPHY
Anesthesia: Moderate Sedation | Site: Leg Lower | Laterality: Left

## 2023-01-21 MED ORDER — FENTANYL CITRATE (PF) 100 MCG/2ML IJ SOLN
INTRAMUSCULAR | Status: DC | PRN
  Administered 2023-01-21: 50 ug via INTRAVENOUS

## 2023-01-21 MED ORDER — HEPARIN (PORCINE) IN NACL 2000-0.9 UNIT/L-% IV SOLN
INTRAVENOUS | Status: DC | PRN
  Administered 2023-01-21: 1000 mL

## 2023-01-21 MED ORDER — MIDAZOLAM HCL 2 MG/2ML IJ SOLN
INTRAMUSCULAR | Status: DC | PRN
  Administered 2023-01-21: 2 mg via INTRAVENOUS

## 2023-01-21 MED ORDER — ONDANSETRON HCL 4 MG/2ML IJ SOLN: 4.0000 mg | Freq: Four times a day (QID) | INTRAMUSCULAR | Status: DC | PRN

## 2023-01-21 MED ORDER — CEFAZOLIN SODIUM-DEXTROSE 2-4 GM/100ML-% IV SOLN
2.0000 g | INTRAVENOUS | Status: AC
  Administered 2023-01-21: 2 g via INTRAVENOUS

## 2023-01-21 MED ORDER — SODIUM CHLORIDE 0.9 % IV SOLN: 250.0000 mL | INTRAVENOUS | Status: DC | PRN

## 2023-01-21 MED ORDER — SODIUM CHLORIDE 0.9% FLUSH: 3.0000 mL | INTRAVENOUS | Status: DC | PRN

## 2023-01-21 MED ORDER — LABETALOL HCL 5 MG/ML IV SOLN: 10.0000 mg | INTRAVENOUS | Status: DC | PRN

## 2023-01-21 MED ORDER — CEFAZOLIN SODIUM-DEXTROSE 2-4 GM/100ML-% IV SOLN
INTRAVENOUS | Status: AC
  Filled 2023-01-21: qty 100

## 2023-01-21 MED ORDER — HEPARIN SODIUM (PORCINE) 1000 UNIT/ML IJ SOLN
INTRAMUSCULAR | Status: AC
  Filled 2023-01-21: qty 10

## 2023-01-21 MED ORDER — ACETAMINOPHEN 325 MG PO TABS: 650.0000 mg | ORAL_TABLET | ORAL | Status: DC | PRN

## 2023-01-21 MED ORDER — MIDAZOLAM HCL 5 MG/5ML IJ SOLN
INTRAMUSCULAR | Status: AC
  Filled 2023-01-21: qty 5

## 2023-01-21 MED ORDER — IODIXANOL 320 MG/ML IV SOLN
INTRAVENOUS | Status: DC | PRN
  Administered 2023-01-21: 75 mL via INTRA_ARTERIAL

## 2023-01-21 MED ORDER — SODIUM CHLORIDE 0.9 % IV SOLN: INTRAVENOUS | Status: DC

## 2023-01-21 MED ORDER — FENTANYL CITRATE (PF) 100 MCG/2ML IJ SOLN
INTRAMUSCULAR | Status: AC
  Filled 2023-01-21: qty 2

## 2023-01-21 MED ORDER — HEPARIN SODIUM (PORCINE) 1000 UNIT/ML IJ SOLN
INTRAMUSCULAR | Status: DC | PRN
  Administered 2023-01-21: 6000 [IU] via INTRAVENOUS

## 2023-01-21 MED ORDER — MORPHINE SULFATE (PF) 4 MG/ML IV SOLN: 2.0000 mg | INTRAVENOUS | Status: DC | PRN

## 2023-01-21 MED ORDER — HYDROMORPHONE HCL 1 MG/ML IJ SOLN: 1.0000 mg | Freq: Once | INTRAMUSCULAR | Status: DC | PRN

## 2023-01-21 MED ORDER — LIDOCAINE HCL (PF) 1 % IJ SOLN
INTRAMUSCULAR | Status: DC | PRN
  Administered 2023-01-21: 10 mL

## 2023-01-21 MED ORDER — CLOPIDOGREL BISULFATE 300 MG PO TABS
300.0000 mg | ORAL_TABLET | ORAL | Status: AC
  Administered 2023-01-21: 300 mg via ORAL

## 2023-01-21 MED ORDER — SODIUM CHLORIDE 0.9% FLUSH: 3.0000 mL | Freq: Two times a day (BID) | INTRAVENOUS | Status: DC

## 2023-01-21 MED ORDER — DIPHENHYDRAMINE HCL 50 MG/ML IJ SOLN: 50.0000 mg | Freq: Once | INTRAMUSCULAR | Status: DC | PRN

## 2023-01-21 MED ORDER — FAMOTIDINE 20 MG PO TABS: 40.0000 mg | ORAL_TABLET | Freq: Once | ORAL | Status: DC | PRN

## 2023-01-21 MED ORDER — MIDAZOLAM HCL 2 MG/ML PO SYRP: 8.0000 mg | ORAL_SOLUTION | Freq: Once | ORAL | Status: DC | PRN

## 2023-01-21 MED ORDER — METHYLPREDNISOLONE SODIUM SUCC 125 MG IJ SOLR: 125.0000 mg | Freq: Once | INTRAMUSCULAR | Status: DC | PRN

## 2023-01-21 MED ORDER — HYDRALAZINE HCL 20 MG/ML IJ SOLN: 5.0000 mg | INTRAMUSCULAR | Status: DC | PRN

## 2023-01-21 MED ORDER — OXYCODONE HCL 5 MG PO TABS: 5.0000 mg | ORAL_TABLET | ORAL | Status: DC | PRN

## 2023-01-21 MED ORDER — CLOPIDOGREL BISULFATE 75 MG PO TABS: 75.0000 mg | ORAL_TABLET | Freq: Every day | ORAL | 5 refills | Status: DC

## 2023-01-21 MED ORDER — CLOPIDOGREL BISULFATE 75 MG PO TABS
ORAL_TABLET | ORAL | Status: AC
  Filled 2023-01-21: qty 4

## 2023-01-21 SURGICAL SUPPLY — 25 items
BALLN LUTONIX 6X120X130 (BALLOONS) ×1
BALLN LUTONIX AV 8X40X75 (BALLOONS) ×1
BALLOON LUTONIX 6X120X130 (BALLOONS) IMPLANT
BALLOON LUTONIX AV 8X40X75 (BALLOONS) IMPLANT
CATH ANGIO 5F PIGTAIL 65CM (CATHETERS) IMPLANT
CATH ROTAREX 135 6FR (CATHETERS) IMPLANT
CATH SEEKER .035X135CM (CATHETERS) IMPLANT
COVER EZ STRL 42X30 (DRAPES) IMPLANT
COVER PROBE ULTRASOUND 5X96 (MISCELLANEOUS) IMPLANT
DEVICE STARCLOSE SE CLOSURE (Vascular Products) IMPLANT
GLIDEWIRE ADV .035X260CM (WIRE) IMPLANT
GOWN STRL REUS W/ TWL LRG LVL3 (GOWN DISPOSABLE) ×1 IMPLANT
GOWN STRL REUS W/TWL LRG LVL3 (GOWN DISPOSABLE) ×1
KIT ENCORE 26 ADVANTAGE (KITS) IMPLANT
NDL ENTRY 21GA 7CM ECHOTIP (NEEDLE) IMPLANT
NEEDLE ENTRY 21GA 7CM ECHOTIP (NEEDLE) ×1 IMPLANT
PACK ANGIOGRAPHY (CUSTOM PROCEDURE TRAY) ×1 IMPLANT
SET INTRO CAPELLA COAXIAL (SET/KITS/TRAYS/PACK) IMPLANT
SHEATH ANL2 6FRX45 HC (SHEATH) IMPLANT
SHEATH BRITE TIP 5FRX11 (SHEATH) IMPLANT
STENT LIFESTENT 5F 7X120X135 (Permanent Stent) IMPLANT
SYR MEDRAD MARK 7 150ML (SYRINGE) IMPLANT
TUBING CONTRAST HIGH PRESS 72 (TUBING) IMPLANT
WIRE G V18X300CM (WIRE) IMPLANT
WIRE GUIDERIGHT .035X150 (WIRE) IMPLANT

## 2023-01-21 NOTE — Op Note (Signed)
[facility]  Percutaneous Study/Intervention Procedural Note   Date of Surgery: 01/21/2023  Surgeon:  Renford Dills, MD.  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  Introduction catheter into left lower extremity 3rd order catheter placement              2.  Contrast injection left lower extremity for distal runoff             3.  Percutaneous transluminal angioplasty and stent placement left superficial femoral artery.              4.  Atherectomy left SFA with the [place] Rex catheter.                5.  Percutaneous transluminal angioplasty to 8 mm right common iliac artery.                   6.  Star close closure right common femoral arteriotomy  Anesthesia: Conscious sedation was administered under my direct supervision by the interventional radiology RN. IV Versed plus fentanyl were utilized. Continuous ECG, pulse oximetry and blood pressure was monitored throughout the entire procedure.  Conscious sedation was for a total of 40 minutes.  Sheath: 6 [place] Ansell right common femoral retrograde  Contrast: 75 cc  Fluoroscopy Time: 7.1 minutes  Indications:  Paula D Kluth presents with nonhealing wound of the left foot.  She has known atherosclerotic occlusive disease and has undergone intervention in the past.  Physical examination as well as noninvasive studies suggest significant in-stent restenosis.  Angiography with the intention for intervention was recommended.  The risks and benefits are reviewed all questions answered patient agrees to proceed.  Procedure:  Kelin Bozek Pua is a 62 y.o. y.o. female who was identified and appropriate procedural time out was performed.  The patient was then placed supine on the table and prepped and draped in the usual sterile fashion.    Ultrasound was placed in the  sterile sleeve and the right groin was evaluated the right common femoral artery was echolucent and pulsatile indicating patency.  Image was recorded for the permanent record and under real-time visualization a microneedle was inserted into the common femoral artery microwire followed by a micro-sheath.  A J-wire was then advanced through the micro-sheath and a  5 [place] sheath was then inserted over a J-wire. J-wire was then advanced and a 5 French pigtail catheter was positioned at the level of T12. AP projection of the aorta was then obtained. Pigtail catheter was repositioned to above the bifurcation and a bilateral oblique view of the pelvis was obtained.  Subsequently a pigtail catheter with the stiff angle Glidewire was used to cross the aortic bifurcation the catheter wire were advanced down into the left distal external iliac artery. Oblique view of the femoral bifurcation was then obtained and subsequently the wire was reintroduced and the pigtail catheter negotiated into the SFA representing third order catheter placement. Distal runoff was then performed.  6000 units of heparin was then given and allowed to circulate and a 6 [place] Ansell sheath was advanced up and over the bifurcation and positioned in the femoral artery  A seeker catheter was then used to exchange the advantage wire for a V18 wire.  [place] Rex catheter was prepped on the field and then used  to perform atherectomy in the midportion of the SFA.  Follow-up imaging demonstrates significant improvement in the luminal gain but significant residual stenosis greater than or equal to 50%.  A 6 mm x 120 mm life stent is then deployed across these lesions postdilated with a 6 mm Lutonix drug-eluting balloon inflated to 12 atm for 1 minute.  Follow-up imaging now demonstrates less than 10% residual stenosis with preservation of distal runoff.  The sheath was then pulled back into the distal common iliac artery on the right.  Reimaging in an  LAO projection is then performed an 8 mm x 40 mm Lutonix drug-eluting balloon is advanced across this ostial lesion inflated to 10 atm for 1 minute.  Follow-up imaging demonstrates approximately 30% residual stenosis there is a marked improvement and as noted below we will hold tight at this point and further or more completely treated her iliac disease once her left foot is healed.  After review of these images the sheath is pulled into the right external iliac oblique of the common femoral is obtained and a Star close device deployed. There no immediate complications.   Findings:  The abdominal aorta is opacified with a bolus injection contrast. Renal arteries are single and widely patent bilaterally. The aorta itself has diffuse disease but no hemodynamically significant lesions. The right common iliac artery demonstrates a greater than 90% stenosis at its ostia.  There is 40 to 50% stenosis of the left common iliac artery at its ostia.  On the right there is poststenotic dilatation noted.  The iliac bifurcation is widely patent.  The distal right external iliac artery demonstrates a 60% stenosis near the ileal inguinal ligament.  The left common iliac artery and iliac bifurcation are patent distal to the ostial stenosis.  The left external iliac artery is widely patent.  The left common femoral demonstrates approximately 50% stenosis with diffuse plaque.  The profunda femoris is widely patent.  The SFA does indeed have a significant stenosis in the previously stented segment in the midportion there are 3 greater than 90% stenoses in the series.  The popliteal demonstrates diffuse disease but no hemodynamically significant stenoses.  The trifurcation is patent with a moderate stenosis in the tibioperoneal trunk.  The posterior tibial is the dominant runoff to the foot.  The anterior tibial demonstrates delayed filling distally secondary to a distal dorsalis pedis stenosis.  Peroneal is patent but rather  small.    Following atherectomy the SFA is now patent with approximately 50% stenoses at the previously noted high-grade lesions  Following angioplasty and stent placement to 6 mm the SFA is now patent with in-line flow and looks quite nice.  Three-vessel runoff is preserved.  Following angioplasty of the common iliac artery there is now patency with approximately 30% residual stenosis.  I would not stent this as it would require kissing stents in the bilateral common iliac arteries and she is not having significant symptoms on the right.  This would also likely preclude further interventions on the left.  Treatment of her iliac disease can be considered once her left foot has healed.    Summary: Successful recanalization left lower extremity for limb salvage                        Disposition: Patient was taken to the recovery room in stable condition having tolerated the procedure well.  , Latina Craver 01/21/2023,9:17 AM

## 2023-01-21 NOTE — H&P (View-Only) (Signed)
MRN : [id number]  Chelsea Barton is a 62 y.o. (1960/09/18) female who presents with chief complaint of check circulation.  History of Present Illness:   The patient presents to [facility] today for treatment of her atherosclerotic occlusive disease.  There have been an interval changes in lower extremity symptoms with worsening of her claudication symptoms.  She is now describing interval shortening of her claudication distance and development of rest pain symptoms. No new ulcers or wounds have occurred since the last visit but they are still dealing with an area of the left foot and she has upcoming surgery for further treatment.   There have been no significant changes to the patient's overall health care.   The patient denies amaurosis fugax or recent TIA symptoms. There are no documented recent neurological changes noted. There is no history of DVT, PE or superficial thrombophlebitis. The patient denies recent episodes of angina or shortness of breath.    ABI Rt= 0.86 and Lt= 0.84 (previous ABI's Rt= 0.87 and Lt= 0.86) Duplex ultrasound of the left lower extremity demonstrates a greater than 70% critical stenosis of the mid SFA.  Peak systolic velocities are 536 cm/s.  When compared to the duplex ultrasound in October 2023 velocities were 358.  Current Meds  Medication Sig   aspirin 81 MG EC tablet Take 1 tablet (81 mg total) by mouth daily. Swallow whole.   carvedilol (COREG) 12.5 MG tablet Take 1 tablet (12.5 mg total) by mouth 2 (two) times daily with a meal. Take one tablet by mouth daily with a meal.   dapagliflozin propanediol (FARXIGA) 10 MG TABS tablet TAKE 1 TABLET(10 MG) BY MOUTH DAILY   hydrALAZINE (APRESOLINE) 10 MG tablet TAKE 1/2 TABLET BY MOUTH THREE TIMES DAILY AS NEEDED IF BLOOD PRESSURE IS OVER 140/90   insulin glargine (LANTUS SOLOSTAR) 100 UNIT/ML Solostar Pen Inject 40  Units into the skin at bedtime.   Multiple Vitamin (MULTIVITAMIN) capsule Take 1 capsule by mouth daily.   NIFEdipine (PROCARDIA-XL/NIFEDICAL-XL) 30 MG 24 hr tablet TAKE 1 TABLET(30 MG) BY MOUTH DAILY (Patient taking differently: 30 mg every morning.)   NOVOLOG FLEXPEN 100 UNIT/ML FlexPen INJECT 40 UNITS DAILY MAX   rosuvastatin (CRESTOR) 10 MG tablet TAKE 1 TABLET(10 MG) BY MOUTH DAILY (Patient taking differently: Take 10 mg by mouth at bedtime.)   Vitamin D, Cholecalciferol, 10 MCG (400 UNIT) CAPS Take 1 capsule by mouth daily.    Past Medical History:  Diagnosis Date   Aortic atherosclerosis (HCC)    CKD (chronic kidney disease), stage IV (HCC)    not on dialysis   COPD (chronic obstructive pulmonary disease) (HCC)    Coronary artery disease    Diastolic dysfunction    a.) TTE 07/18/2021: EF 50-55%, moderate LVH, mild LAE, mild-mod MR, AoV sclerosis, G1DD   Esophageal thickening    Hyperlipidemia    Hypertension    Impaired vision    LVH (left ventricular hypertrophy) due to hypertensive disease, without heart failure    Obesity    Osteomyelitis of left foot (HCC)    a.) s/p  LEFT transmetatarsal amputation   PAD (peripheral artery disease) (HCC)    a.) s/p PTA and stenting of LEFT SFA 04/13/2021 --> 6 x 200 mm LifeStent with distal edge just beyond Hunter's canal extending proximally and a 6 x 80 mm LifeStent to the origin of the SFA (stents overlapped by approximately 10 mm); b.) LE vascular duplex 09/26/2022 --> 75-99% stenosis LEFT SFA.   Proteinuria    Secondary hyperparathyroidism of renal origin (HCC)    Type 2 diabetes mellitus treated with insulin (HCC)    a.) uses Dexcom CGM and OmniPod insulin pump    Past Surgical History:  Procedure Laterality Date   AMPUTATION TOE  04/10/2021   Procedure: AMPUTATION 2nd TOE, left foot;  Surgeon: Felecia Shelling, DPM;  Location: [facility];  Service: Podiatry;;   CATARACT EXTRACTION     eye surgery r Right    lense placed   GRAFT  APPLICATION Left 12/26/2021   Procedure: GRAFT APPLICATION-left foot;  Surgeon: Edwin Cap, DPM;  Location: [facility];  Service: Podiatry;  Laterality: Left;   GRAFT APPLICATION Left 10/04/2022   Procedure: GRAFT APPLICATION;  Surgeon: Edwin Cap, DPM;  Location: [facility];  Service: Podiatry;  Laterality: Left;   I & D EXTREMITY Left 12/26/2021   Procedure: IRRIGATION AND DEBRIDEMENT EXTREMITY-left foot;  Surgeon: Edwin Cap, DPM;  Location: [facility];  Service: Podiatry;  Laterality: Left;   INCISION AND DRAINAGE Left 04/18/2021   Procedure: INCISION AND DRAINAGE;  Surgeon: Edwin Cap, DPM;  Location: [facility];  Service: Podiatry;  Laterality: Left;   INCISION AND DRAINAGE OF WOUND Left 04/10/2021   Procedure: IRRIGATION AND DEBRIDEMENT WOUND;  Surgeon: Felecia Shelling, DPM;  Location: [facility];  Service: Podiatry;  Laterality: Left;   PERIPHERAL VASCULAR BALLOON ANGIOPLASTY Left 04/13/2021   Procedure: PERIPHERAL VASCULAR BALLOON ANGIOPLASTY;  Surgeon: Renford Dills, MD;  Location: [facility];  Service: Cardiovascular;  Laterality: Left;   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   Procedure: TRANSMETATARSAL AMPUTATION;  Surgeon: Candelaria Stagers, DPM;  Location: [facility];  Service: Podiatry;  Laterality: Left;   WOUND DEBRIDEMENT Left 10/04/2022   Procedure: DEBRIDEMENT WOUND;  Surgeon: Edwin Cap, DPM;  Location: [facility];  Service: Podiatry;  Laterality: Left;    Social History Social History   Tobacco Use   Smoking status: Former    Current packs/day: 0.00    Average packs/day: 1 pack/day for 25.0 years (25.0 ttl pk-yrs)    Types: Cigarettes    Start date: 06/18/1980    Quit date: 06/18/2005    Years since quitting: 17.6   Smokeless tobacco: Never  Vaping Use   Vaping status: Never Used  Substance Use Topics   Alcohol use: No   Drug use: No    Family History Family History  Problem Relation Age of Onset   Kidney disease Sister     Diabetes Sister        3/5 sisters have DM   Breast cancer Neg Hx    Colon cancer Neg Hx     No Known Allergies   REVIEW OF SYSTEMS (Negative unless checked)  Constitutional: [] Weight loss  [] Fever  [] Chills Cardiac: [] Chest pain   [] Chest pressure   [] Palpitations   [] Shortness of breath when laying flat   [] Shortness of breath with exertion. Vascular:  [x] Pain in legs with walking   [] Pain in legs at rest  [] History of DVT   [] Phlebitis   [] Swelling in legs   []   Varicose veins   [] Non-healing ulcers Pulmonary:   [] Uses home oxygen   [] Productive cough   [] Hemoptysis   [] Wheeze  [] COPD   [] Asthma Neurologic:  [] Dizziness   [] Seizures   [] History of stroke   [] History of TIA  [] Aphasia   [] Vissual changes   [] Weakness or numbness in arm   [] Weakness or numbness in leg Musculoskeletal:   [] Joint swelling   [] Joint pain   [] Low back pain Hematologic:  [] Easy bruising  [] Easy bleeding   [] Hypercoagulable state   [] Anemic Gastrointestinal:  [] Diarrhea   [] Vomiting  [] Gastroesophageal reflux/heartburn   [] Difficulty swallowing. Genitourinary:  [] Chronic kidney disease   [] Difficult urination  [] Frequent urination   [] Blood in urine Skin:  [] Rashes   [] Ulcers  Psychological:  [] History of anxiety   []  History of major depression.  Physical Examination  Vitals:   01/21/23 0728  BP: 109/70  Pulse: 73  Resp: (!) 22  Temp: 98 F (36.7 C)  TempSrc: Oral  SpO2: 94%  Weight: 94.3 kg  Height: 5\' 5"  (1.651 m)   Body mass index is 34.6 kg/m. Gen: WD/WN, NAD Head: [place]/AT, No temporalis wasting.  Ear/Nose/Throat: Hearing grossly intact, nares w/o erythema or drainage Eyes: PER, EOMI, sclera nonicteric.  Neck: Supple, no masses.  No bruit or JVD.  Pulmonary:  Good air movement, no audible wheezing, no use of accessory muscles.  Cardiac: RRR, normal S1, S2, no Murmurs. Vascular:  mild trophic changes, left open wounds Vessel Right Left  Radial Palpable Palpable  PT Not Palpable Not  Palpable  DP Not Palpable Not Palpable  Gastrointestinal: soft, non-distended. No guarding/no peritoneal signs.  Musculoskeletal: M/S 5/5 throughout.  No visible deformity.  Neurologic: CN 2-12 intact. Pain and light touch intact in extremities.  Symmetrical.  Speech is fluent. Motor exam as listed above. Psychiatric: Judgment intact, Mood & affect appropriate for pt's clinical situation. Dermatologic: No rashes or ulcers noted.  No changes consistent with cellulitis.   CBC Lab Results  Component Value Date   WBC 8.7 09/24/2022   HGB 13.5 09/24/2022   HCT 43.0 09/24/2022   MCV 84.1 09/24/2022   PLT 214.0 09/24/2022    BMET    Component Value Date/Time   NA 141 09/24/2022 1031   NA 137 01/20/2014 0000   K 4.9 09/24/2022 1031   CL 102 09/24/2022 1031   CO2 30 09/24/2022 1031   GLUCOSE 112 (H) 09/24/2022 1031   BUN 36 (H) 09/24/2022 1031   BUN 25 (A) 01/20/2014 0000   CREATININE 2.38 (H) 09/24/2022 1031   CALCIUM 10.1 09/24/2022 1031   GFRNONAA 32 (L) 04/24/2021 0654   GFRAA 53 (L) 11/14/2017 1349   CrCl cannot be calculated (Patient's most recent lab result is older than the maximum 21 days allowed.).  COAG Lab Results  Component Value Date   INR 1.0 09/24/2022   INR 1.3 (H) 04/10/2021    Radiology No results found.   Assessment/Plan 1. Atherosclerosis of native arteries of the extremities with ulceration  Recommend:   The patient has evidence of severe atherosclerotic changes of both lower extremities associated with a nonhealed area of the left foot.  This represents a limb threatening ischemia and places the patient at the risk for left limb loss.  She has had extensive revascularization in the past and attempt to salvage her left lower extremity.  Follow-up imaging now demonstrates a marked progression in a focal area of the left SFA.  This is now reached a critical stenosis and  threatens her past reconstruction.  She has advanced renal disease and dealing with a  focal stenosis rather than a reocclusion of her entire SFA and popliteal would entail significantly less contrast making it markedly better for the patient.   Patient should undergo angiography of the left lower extremity with the hope for intervention for limb salvage.  The risks and benefits as well as the alternative therapies was discussed in detail with the patient.  All questions were answered.  Patient agrees to proceed with left lower extremity angiography.   The patient will follow up with me in the office after the procedure.    I70.25    Atherosclerotic occlusive disease with ulcer   CPT codes: 21308   stent placement iliac artery 37226   stent placement femoral-popliteal artery 37228   angioplasty tibial peroneal artery 36247   introduction catheter below diaphragm third order   2. Coronary artery disease involving native heart without angina pectoris, unspecified vessel or lesion type Continue cardiac and antihypertensive medications as already ordered and reviewed, no changes at this time.   Continue statin as ordered and reviewed, no changes at this time   Nitrates PRN for chest pain   3. Primary hypertension Continue antihypertensive medications as already ordered, these medications have been reviewed and there are no changes at this time.   4. Type 2 diabetes mellitus with stage 4 chronic kidney disease, with long-term current use of insulin Continue hypoglycemic medications as already ordered, these medications have been reviewed and there are no changes at this time.   Hgb A1C to be monitored as already arranged by primary service   5. Stage 4 chronic kidney disease The patient has advanced renal disease.  However, at the present time the patient is not yet on dialysis.   Avoid nephrotoxic medications and dehydration.   Given these above goals then treating this critical lesion now prior to to it becoming a complete pleat reocclusion would entail significantly  less contrast and therefore would be much safer and preferable for the patient.  Further plans per nephrology   Levora Dredge, MD  01/21/2023 7:59 AM

## 2023-01-21 NOTE — Progress Notes (Signed)
MRN : [id number]  Chelsea Barton is a 62 y.o. (1960/09/18) female who presents with chief complaint of check circulation.  History of Present Illness:   The patient presents to [facility] today for treatment of her atherosclerotic occlusive disease.  There have been an interval changes in lower extremity symptoms with worsening of her claudication symptoms.  She is now describing interval shortening of her claudication distance and development of rest pain symptoms. No new ulcers or wounds have occurred since the last visit but they are still dealing with an area of the left foot and she has upcoming surgery for further treatment.   There have been no significant changes to the patient's overall health care.   The patient denies amaurosis fugax or recent TIA symptoms. There are no documented recent neurological changes noted. There is no history of DVT, PE or superficial thrombophlebitis. The patient denies recent episodes of angina or shortness of breath.    ABI Rt= 0.86 and Lt= 0.84 (previous ABI's Rt= 0.87 and Lt= 0.86) Duplex ultrasound of the left lower extremity demonstrates a greater than 70% critical stenosis of the mid SFA.  Peak systolic velocities are 536 cm/s.  When compared to the duplex ultrasound in October 2023 velocities were 358.  Current Meds  Medication Sig   aspirin 81 MG EC tablet Take 1 tablet (81 mg total) by mouth daily. Swallow whole.   carvedilol (COREG) 12.5 MG tablet Take 1 tablet (12.5 mg total) by mouth 2 (two) times daily with a meal. Take one tablet by mouth daily with a meal.   dapagliflozin propanediol (FARXIGA) 10 MG TABS tablet TAKE 1 TABLET(10 MG) BY MOUTH DAILY   hydrALAZINE (APRESOLINE) 10 MG tablet TAKE 1/2 TABLET BY MOUTH THREE TIMES DAILY AS NEEDED IF BLOOD PRESSURE IS OVER 140/90   insulin glargine (LANTUS SOLOSTAR) 100 UNIT/ML Solostar Pen Inject 40  Units into the skin at bedtime.   Multiple Vitamin (MULTIVITAMIN) capsule Take 1 capsule by mouth daily.   NIFEdipine (PROCARDIA-XL/NIFEDICAL-XL) 30 MG 24 hr tablet TAKE 1 TABLET(30 MG) BY MOUTH DAILY (Patient taking differently: 30 mg every morning.)   NOVOLOG FLEXPEN 100 UNIT/ML FlexPen INJECT 40 UNITS DAILY MAX   rosuvastatin (CRESTOR) 10 MG tablet TAKE 1 TABLET(10 MG) BY MOUTH DAILY (Patient taking differently: Take 10 mg by mouth at bedtime.)   Vitamin D, Cholecalciferol, 10 MCG (400 UNIT) CAPS Take 1 capsule by mouth daily.    Past Medical History:  Diagnosis Date   Aortic atherosclerosis (HCC)    CKD (chronic kidney disease), stage IV (HCC)    not on dialysis   COPD (chronic obstructive pulmonary disease) (HCC)    Coronary artery disease    Diastolic dysfunction    a.) TTE 07/18/2021: EF 50-55%, moderate LVH, mild LAE, mild-mod MR, AoV sclerosis, G1DD   Esophageal thickening    Hyperlipidemia    Hypertension    Impaired vision    LVH (left ventricular hypertrophy) due to hypertensive disease, without heart failure    Obesity    Osteomyelitis of left foot (HCC)    a.) s/p  LEFT transmetatarsal amputation   PAD (peripheral artery disease) (HCC)    a.) s/p PTA and stenting of LEFT SFA 04/13/2021 --> 6 x 200 mm LifeStent with distal edge just beyond Hunter's canal extending proximally and a 6 x 80 mm LifeStent to the origin of the SFA (stents overlapped by approximately 10 mm); b.) LE vascular duplex 09/26/2022 --> 75-99% stenosis LEFT SFA.   Proteinuria    Secondary hyperparathyroidism of renal origin (HCC)    Type 2 diabetes mellitus treated with insulin (HCC)    a.) uses Dexcom CGM and OmniPod insulin pump    Past Surgical History:  Procedure Laterality Date   AMPUTATION TOE  04/10/2021   Procedure: AMPUTATION 2nd TOE, left foot;  Surgeon: Felecia Shelling, DPM;  Location: [facility];  Service: Podiatry;;   CATARACT EXTRACTION     eye surgery r Right    lense placed   GRAFT  APPLICATION Left 12/26/2021   Procedure: GRAFT APPLICATION-left foot;  Surgeon: Edwin Cap, DPM;  Location: [facility];  Service: Podiatry;  Laterality: Left;   GRAFT APPLICATION Left 10/04/2022   Procedure: GRAFT APPLICATION;  Surgeon: Edwin Cap, DPM;  Location: [facility];  Service: Podiatry;  Laterality: Left;   I & D EXTREMITY Left 12/26/2021   Procedure: IRRIGATION AND DEBRIDEMENT EXTREMITY-left foot;  Surgeon: Edwin Cap, DPM;  Location: [facility];  Service: Podiatry;  Laterality: Left;   INCISION AND DRAINAGE Left 04/18/2021   Procedure: INCISION AND DRAINAGE;  Surgeon: Edwin Cap, DPM;  Location: [facility];  Service: Podiatry;  Laterality: Left;   INCISION AND DRAINAGE OF WOUND Left 04/10/2021   Procedure: IRRIGATION AND DEBRIDEMENT WOUND;  Surgeon: Felecia Shelling, DPM;  Location: [facility];  Service: Podiatry;  Laterality: Left;   PERIPHERAL VASCULAR BALLOON ANGIOPLASTY Left 04/13/2021   Procedure: PERIPHERAL VASCULAR BALLOON ANGIOPLASTY;  Surgeon: Renford Dills, MD;  Location: [facility];  Service: Cardiovascular;  Laterality: Left;   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   Procedure: TRANSMETATARSAL AMPUTATION;  Surgeon: Candelaria Stagers, DPM;  Location: [facility];  Service: Podiatry;  Laterality: Left;   WOUND DEBRIDEMENT Left 10/04/2022   Procedure: DEBRIDEMENT WOUND;  Surgeon: Edwin Cap, DPM;  Location: [facility];  Service: Podiatry;  Laterality: Left;    Social History Social History   Tobacco Use   Smoking status: Former    Current packs/day: 0.00    Average packs/day: 1 pack/day for 25.0 years (25.0 ttl pk-yrs)    Types: Cigarettes    Start date: 06/18/1980    Quit date: 06/18/2005    Years since quitting: 17.6   Smokeless tobacco: Never  Vaping Use   Vaping status: Never Used  Substance Use Topics   Alcohol use: No   Drug use: No    Family History Family History  Problem Relation Age of Onset   Kidney disease Sister     Diabetes Sister        3/5 sisters have DM   Breast cancer Neg Hx    Colon cancer Neg Hx     No Known Allergies   REVIEW OF SYSTEMS (Negative unless checked)  Constitutional: [] Weight loss  [] Fever  [] Chills Cardiac: [] Chest pain   [] Chest pressure   [] Palpitations   [] Shortness of breath when laying flat   [] Shortness of breath with exertion. Vascular:  [x] Pain in legs with walking   [] Pain in legs at rest  [] History of DVT   [] Phlebitis   [] Swelling in legs   []   Varicose veins   [] Non-healing ulcers Pulmonary:   [] Uses home oxygen   [] Productive cough   [] Hemoptysis   [] Wheeze  [] COPD   [] Asthma Neurologic:  [] Dizziness   [] Seizures   [] History of stroke   [] History of TIA  [] Aphasia   [] Vissual changes   [] Weakness or numbness in arm   [] Weakness or numbness in leg Musculoskeletal:   [] Joint swelling   [] Joint pain   [] Low back pain Hematologic:  [] Easy bruising  [] Easy bleeding   [] Hypercoagulable state   [] Anemic Gastrointestinal:  [] Diarrhea   [] Vomiting  [] Gastroesophageal reflux/heartburn   [] Difficulty swallowing. Genitourinary:  [] Chronic kidney disease   [] Difficult urination  [] Frequent urination   [] Blood in urine Skin:  [] Rashes   [] Ulcers  Psychological:  [] History of anxiety   []  History of major depression.  Physical Examination  Vitals:   01/21/23 0728  BP: 109/70  Pulse: 73  Resp: (!) 22  Temp: 98 F (36.7 C)  TempSrc: Oral  SpO2: 94%  Weight: 94.3 kg  Height: 5\' 5"  (1.651 m)   Body mass index is 34.6 kg/m. Gen: WD/WN, NAD Head: [place]/AT, No temporalis wasting.  Ear/Nose/Throat: Hearing grossly intact, nares w/o erythema or drainage Eyes: PER, EOMI, sclera nonicteric.  Neck: Supple, no masses.  No bruit or JVD.  Pulmonary:  Good air movement, no audible wheezing, no use of accessory muscles.  Cardiac: RRR, normal S1, S2, no Murmurs. Vascular:  mild trophic changes, left open wounds Vessel Right Left  Radial Palpable Palpable  PT Not Palpable Not  Palpable  DP Not Palpable Not Palpable  Gastrointestinal: soft, non-distended. No guarding/no peritoneal signs.  Musculoskeletal: M/S 5/5 throughout.  No visible deformity.  Neurologic: CN 2-12 intact. Pain and light touch intact in extremities.  Symmetrical.  Speech is fluent. Motor exam as listed above. Psychiatric: Judgment intact, Mood & affect appropriate for pt's clinical situation. Dermatologic: No rashes or ulcers noted.  No changes consistent with cellulitis.   CBC Lab Results  Component Value Date   WBC 8.7 09/24/2022   HGB 13.5 09/24/2022   HCT 43.0 09/24/2022   MCV 84.1 09/24/2022   PLT 214.0 09/24/2022    BMET    Component Value Date/Time   NA 141 09/24/2022 1031   NA 137 01/20/2014 0000   K 4.9 09/24/2022 1031   CL 102 09/24/2022 1031   CO2 30 09/24/2022 1031   GLUCOSE 112 (H) 09/24/2022 1031   BUN 36 (H) 09/24/2022 1031   BUN 25 (A) 01/20/2014 0000   CREATININE 2.38 (H) 09/24/2022 1031   CALCIUM 10.1 09/24/2022 1031   GFRNONAA 32 (L) 04/24/2021 0654   GFRAA 53 (L) 11/14/2017 1349   CrCl cannot be calculated (Patient's most recent lab result is older than the maximum 21 days allowed.).  COAG Lab Results  Component Value Date   INR 1.0 09/24/2022   INR 1.3 (H) 04/10/2021    Radiology No results found.   Assessment/Plan 1. Atherosclerosis of native arteries of the extremities with ulceration  Recommend:   The patient has evidence of severe atherosclerotic changes of both lower extremities associated with a nonhealed area of the left foot.  This represents a limb threatening ischemia and places the patient at the risk for left limb loss.  She has had extensive revascularization in the past and attempt to salvage her left lower extremity.  Follow-up imaging now demonstrates a marked progression in a focal area of the left SFA.  This is now reached a critical stenosis and  threatens her past reconstruction.  She has advanced renal disease and dealing with a  focal stenosis rather than a reocclusion of her entire SFA and popliteal would entail significantly less contrast making it markedly better for the patient.   Patient should undergo angiography of the left lower extremity with the hope for intervention for limb salvage.  The risks and benefits as well as the alternative therapies was discussed in detail with the patient.  All questions were answered.  Patient agrees to proceed with left lower extremity angiography.   The patient will follow up with me in the office after the procedure.    I70.25    Atherosclerotic occlusive disease with ulcer   CPT codes: 21308   stent placement iliac artery 37226   stent placement femoral-popliteal artery 37228   angioplasty tibial peroneal artery 36247   introduction catheter below diaphragm third order   2. Coronary artery disease involving native heart without angina pectoris, unspecified vessel or lesion type Continue cardiac and antihypertensive medications as already ordered and reviewed, no changes at this time.   Continue statin as ordered and reviewed, no changes at this time   Nitrates PRN for chest pain   3. Primary hypertension Continue antihypertensive medications as already ordered, these medications have been reviewed and there are no changes at this time.   4. Type 2 diabetes mellitus with stage 4 chronic kidney disease, with long-term current use of insulin Continue hypoglycemic medications as already ordered, these medications have been reviewed and there are no changes at this time.   Hgb A1C to be monitored as already arranged by primary service   5. Stage 4 chronic kidney disease The patient has advanced renal disease.  However, at the present time the patient is not yet on dialysis.   Avoid nephrotoxic medications and dehydration.   Given these above goals then treating this critical lesion now prior to to it becoming a complete pleat reocclusion would entail significantly  less contrast and therefore would be much safer and preferable for the patient.  Further plans per nephrology   Levora Dredge, MD  01/21/2023 7:59 AM

## 2023-01-21 NOTE — Discharge Instructions (Signed)

## 2023-01-21 NOTE — Interval H&P Note (Signed)
History and Physical Interval Note:  01/21/2023 8:04 AM  Chelsea Barton  has presented today for surgery, with the diagnosis of LLE Angio w intervention   BARD   ASO w ulceration.  The various methods of treatment have been discussed with the patient and family. After consideration of risks, benefits and other options for treatment, the patient has consented to  Procedure(s): Lower Extremity Angiography (Left) as a surgical intervention.  The patient's history has been reviewed, patient examined, no change in status, stable for surgery.  I have reviewed the patient's chart and labs.  Questions were answered to the patient's satisfaction.     Levora Dredge

## 2023-01-22 ENCOUNTER — Encounter: Payer: Self-pay | Admitting: Vascular Surgery

## 2023-01-29 ENCOUNTER — Telehealth: Payer: Self-pay | Admitting: Family

## 2023-01-29 DIAGNOSIS — E11319 Type 2 diabetes mellitus with unspecified diabetic retinopathy without macular edema: Secondary | ICD-10-CM

## 2023-01-29 MED ORDER — LANTUS SOLOSTAR 100 UNIT/ML ~~LOC~~ SOPN: 40.0000 [IU] | PEN_INJECTOR | Freq: Every evening | SUBCUTANEOUS | 1 refills | Status: DC

## 2023-01-29 NOTE — Telephone Encounter (Signed)
Pt informed insulin sent in to pharmacy

## 2023-01-29 NOTE — Telephone Encounter (Signed)
Patient just called and said she is out of her insulin. The name is insulin glargine (LANTUS SOLOSTAR) 100 UNIT/ML Solostar Pen. She contacted her pharmacy and they stated she needs to contact her provider. Her pharmacy is [id number] - [place], [place] - [address] AT [facility] [address] [facility] [address] [place], [place] [address] Phone: [phone]  Fax: [phone]  Her number is [phone]

## 2023-02-07 ENCOUNTER — Other Ambulatory Visit (INDEPENDENT_AMBULATORY_CARE_PROVIDER_SITE_OTHER): Payer: Self-pay | Admitting: Vascular Surgery

## 2023-02-07 DIAGNOSIS — Z9889 Other specified postprocedural states: Secondary | ICD-10-CM

## 2023-02-14 ENCOUNTER — Ambulatory Visit (INDEPENDENT_AMBULATORY_CARE_PROVIDER_SITE_OTHER): Payer: Medicaid Other | Admitting: Nurse Practitioner

## 2023-02-14 ENCOUNTER — Encounter (INDEPENDENT_AMBULATORY_CARE_PROVIDER_SITE_OTHER): Payer: Medicaid Other

## 2023-02-20 DIAGNOSIS — E113553 Type 2 diabetes mellitus with stable proliferative diabetic retinopathy, bilateral: Secondary | ICD-10-CM | POA: Diagnosis not present

## 2023-02-20 DIAGNOSIS — Z961 Presence of intraocular lens: Secondary | ICD-10-CM | POA: Diagnosis not present

## 2023-02-20 DIAGNOSIS — H26492 Other secondary cataract, left eye: Secondary | ICD-10-CM | POA: Diagnosis not present

## 2023-02-20 DIAGNOSIS — H35373 Puckering of macula, bilateral: Secondary | ICD-10-CM | POA: Diagnosis not present

## 2023-02-26 ENCOUNTER — Encounter: Payer: Self-pay | Admitting: Podiatry

## 2023-02-26 ENCOUNTER — Ambulatory Visit: Payer: Medicaid Other | Admitting: Podiatry

## 2023-02-26 DIAGNOSIS — L97522 Non-pressure chronic ulcer of other part of left foot with fat layer exposed: Secondary | ICD-10-CM

## 2023-02-27 ENCOUNTER — Telehealth: Payer: Self-pay | Admitting: Podiatry

## 2023-02-27 NOTE — Telephone Encounter (Signed)
Patient was seen on 02/26/23.  Went to [facility] to pick up prescription for foot cream.  Was not a pharmacy.  Can that be resent?

## 2023-02-28 ENCOUNTER — Telehealth: Payer: Self-pay | Admitting: Podiatry

## 2023-02-28 NOTE — Telephone Encounter (Signed)
Patient called again asking for her prescription cream to be resent.

## 2023-03-02 MED ORDER — GENTAMICIN SULFATE 0.1 % EX OINT: 1.0000 | TOPICAL_OINTMENT | Freq: Every day | CUTANEOUS | 0 refills | Status: AC

## 2023-03-02 NOTE — Telephone Encounter (Signed)
Ointment was sent.

## 2023-03-02 NOTE — Progress Notes (Signed)
Subjective:  Patient ID: Chelsea Barton, female    DOB: [birth year],  MRN: [id number]  Chief Complaint  Patient presents with   Wound Check    "It feel good.  It feels better."      62 y.o. female returns for post-op check.  She returns for follow-up,  she has run out of the antibiotic ointment  Review of Systems: Negative except as noted in the HPI. Denies N/V/F/Ch.   Objective:   There were no vitals filed for this visit.  There is no height or weight on file to calculate BMI. Constitutional Well developed. Well nourished.  Vascular Foot warm and well perfused. Capillary refill normal to all digits.  Calf is soft and supple, no posterior calf or knee pain, negative Homans' sign  Neurologic Normal speech. Oriented to person, place, and time. Epicritic sensation to light touch grossly absent bilaterally.  Dermatologic Central residual ulceration postdebridement measures 3.5 x 2.5 cm, no exposed bone, exposed subcutaneous tissue.  No infection, significant hyperkeratosis surrounding this  Orthopedic: She has no tenderness to palpation noted about the surgical site.     Assessment:   1. 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  S/p foot surgery left -She returns for follow-up has undergone angiography recently.  Hopefully will improve her healing capacity.  Wound was debrided of all nonviable tissue to the subcutaneous layer in an excisional manner full-thickness with a sharp scalpel.  Hyperkeratosis and nonviable tissue was removed.  I will see her back in 1 month to reevaluate and for further treatment.  Return in about 1 month (around 03/28/2023) for wound care.

## 2023-03-19 ENCOUNTER — Encounter: Payer: Self-pay | Admitting: Internal Medicine

## 2023-03-19 ENCOUNTER — Encounter: Payer: Medicaid Other | Admitting: Internal Medicine

## 2023-03-19 NOTE — Progress Notes (Signed)
Virtual Visit via Video Note  I connected with Chelsea Barton on 03/19/23 at 9:50 AM by a video enabled telemedicine application and verified that I am speaking with the correct person using two identifiers.   I discussed the limitations of evaluation and management by telemedicine and the availability of in person appointments. The patient expressed understanding and agreed to proceed.   -Location of the patient : -Location of the provider : Office -The names of all persons participating in the telemedicine service : Pt and myself      Name: Chelsea Barton  MRN/ DOB: [id number], 1960/09/22   Age/ Sex: 62 y.o., female    PCP: Allegra Grana, FNP   Reason for Endocrinology Evaluation: Type 2 Diabetes Mellitus     Date of Initial Endocrinology Visit: 07/20/2021    PATIENT IDENTIFIER: Chelsea Barton is a 62 y.o. female with a past medical history of T2DM , CAD and Dyslipidemia. The patient presented for initial endocrinology clinic visit on 07/20/2021 for consultative assistance with her diabetes management.    HPI: Chelsea Barton was    Diagnosed with DM in many years ago  Prior Medications tried/Intolerance: Metformin Currently checking blood sugars multiple x / day, through CGM.  Hemoglobin A1c has ranged from 7.3% in 2020, peaking at 9.5% in 2014.  Follows with Nephrology      SUBJECTIVE:   During the last visit (09/23/2022): A1c 9.1%  Today (03/19/23): Chelsea Barton is here for a follow up on diabetes management. She checks her  blood sugars multiple  times daily. But she forgot her receiver today. The patient has had hypoglycemic episodes since the last clinic visit. Which typically occur at lunch time   She continues to follow-up with podiatry for a left diabetic foot ulcer, s/p graft application 10/04/2022 S/P LLE angio with intervention 01/21/2023 Last podiatry follow-up 02/26/2023   Denies nausea, vomiting or diarrhea   HOME DIABETES REGIMEN: Lantus 40 units  daily NovoLog 5 units with breakfast, 10 units with lunch, and 12 units with supper Correction factor: NovoLog (BG -130/30) Farxiga 10 mg through nephrology    Statin: yes ACE-I/ARB: no   CONTINUOUS GLUCOSE MONITORING RECORD INTERPRETATION    Dates of Recording: 3/26-09/23/2022  Sensor description: dexcom  Results statistics:   CGM use % of time 93  Average and SD 243/57  Time in range      14  %  % Time Above 180 41  % Time above 250 45  % Time Below target 0   Glycemic patterns summary: hyperglycemia noted during the day and night   Hyperglycemic episodes  all day and night   Hypoglycemic episodes occurred n/a  Overnight periods: variable     DIABETIC COMPLICATIONS: Microvascular complications:  CKD IV, retinopathy, S/P left metatarsal amputation  Denies: neuropathy  Last eye exam: Completed 2022  Macrovascular complications:  CAD, PVD Denies:  CVA   PAST HISTORY: Past Medical History:  Past Medical History:  Diagnosis Date   Aortic atherosclerosis (HCC)    CKD (chronic kidney disease), stage IV (HCC)    not on dialysis   COPD (chronic obstructive pulmonary disease) (HCC)    Coronary artery disease    Diastolic dysfunction    a.) TTE 07/18/2021: EF 50-55%, moderate LVH, mild LAE, mild-mod MR, AoV sclerosis, G1DD   Esophageal thickening    Hyperlipidemia    Hypertension    Impaired vision    LVH (left ventricular hypertrophy) due to hypertensive disease, without heart failure  Obesity    Osteomyelitis of left foot (HCC)    a.) s/p LEFT transmetatarsal amputation   PAD (peripheral artery disease) (HCC)    a.) s/p PTA and stenting of LEFT SFA 04/13/2021 --> 6 x 200 mm LifeStent with distal edge just beyond Hunter's canal extending proximally and a 6 x 80 mm LifeStent to the origin of the SFA (stents overlapped by approximately 10 mm); b.) LE vascular duplex 09/26/2022 --> 75-99% stenosis LEFT SFA.   Proteinuria    Secondary hyperparathyroidism of  renal origin (HCC)    Type 2 diabetes mellitus treated with insulin (HCC)    a.) uses Dexcom CGM and OmniPod insulin pump   Past Surgical History:  Past Surgical History:  Procedure Laterality Date   AMPUTATION TOE  04/10/2021   Procedure: AMPUTATION 2nd TOE, left foot;  Surgeon: Felecia Shelling, DPM;  Location: [facility];  Service: Podiatry;;   CATARACT EXTRACTION     eye surgery r Right    lense placed   GRAFT APPLICATION Left 12/26/2021   Procedure: GRAFT APPLICATION-left foot;  Surgeon: Edwin Cap, DPM;  Location: [facility];  Service: Podiatry;  Laterality: Left;   GRAFT APPLICATION Left 10/04/2022   Procedure: GRAFT APPLICATION;  Surgeon: Edwin Cap, DPM;  Location: [facility];  Service: Podiatry;  Laterality: Left;   I & D EXTREMITY Left 12/26/2021   Procedure: IRRIGATION AND DEBRIDEMENT EXTREMITY-left foot;  Surgeon: Edwin Cap, DPM;  Location: [facility];  Service: Podiatry;  Laterality: Left;   INCISION AND DRAINAGE Left 04/18/2021   Procedure: INCISION AND DRAINAGE;  Surgeon: Edwin Cap, DPM;  Location: [facility];  Service: Podiatry;  Laterality: Left;   INCISION AND DRAINAGE OF WOUND Left 04/10/2021   Procedure: IRRIGATION AND DEBRIDEMENT WOUND;  Surgeon: Felecia Shelling, DPM;  Location: [facility];  Service: Podiatry;  Laterality: Left;   LOWER EXTREMITY ANGIOGRAPHY Left 01/21/2023   Procedure: Lower Extremity Angiography;  Surgeon: Renford Dills, MD;  Location: [facility];  Service: Cardiovascular;  Laterality: Left;   PERIPHERAL VASCULAR BALLOON ANGIOPLASTY Left 04/13/2021   Procedure: PERIPHERAL VASCULAR BALLOON ANGIOPLASTY;  Surgeon: Renford Dills, MD;  Location: [facility];  Service: Cardiovascular;  Laterality: Left;   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   Procedure: TRANSMETATARSAL AMPUTATION;  Surgeon: Candelaria Stagers, DPM;  Location: [facility];  Service: Podiatry;  Laterality: Left;   WOUND DEBRIDEMENT Left 10/04/2022    Procedure: DEBRIDEMENT WOUND;  Surgeon: Edwin Cap, DPM;  Location: [facility];  Service: Podiatry;  Laterality: Left;    Social History:  reports that she quit smoking about 17 years ago. Her smoking use included cigarettes. She started smoking about 42 years ago. She has a 25 pack-year smoking history. She has never used smokeless tobacco. She reports that she does not drink alcohol and does not use drugs. Family History:  Family History  Problem Relation Age of Onset   Kidney disease Sister    Diabetes Sister        3/5 sisters have DM   Breast cancer Neg Hx    Colon cancer Neg Hx      HOME MEDICATIONS: Allergies as of 03/19/2023   No Known Allergies      Medication List        Accurate as of March 19, 2023  7:07 AM. If you have any questions, ask your nurse or doctor.          Accu-Chek Aviva Plus test strip  Generic drug: glucose blood Used to check blood sugar 4x daily.   amoxicillin-clavulanate 875-125 MG tablet Commonly known as: AUGMENTIN Take 1 tablet by mouth 2 (two) times daily.   aspirin EC 81 MG tablet Take 1 tablet (81 mg total) by mouth daily. Swallow whole.   Blood Glucose Monitoring Suppl Devi 1 each by Does not apply route in the morning, at noon, and at bedtime. May substitute to any manufacturer covered by patient's insurance.   carvedilol 12.5 MG tablet Commonly known as: COREG Take 1 tablet (12.5 mg total) by mouth 2 (two) times daily with a meal. Take one tablet by mouth daily with a meal.   clopidogrel 75 MG tablet Commonly known as: Plavix Take 1 tablet (75 mg total) by mouth daily.   Dexcom G6 Receiver Devi Use as directed   Dexcom G6 Sensor Misc PLACE A NEW SENSOR EVERY 10 DAYS. USE TO CHECK BLOOD SUGAR AT LEAST 4 TIMES DAILY   Dexcom G6 Transmitter Misc Inject 1 Device into the skin as directed. Every 10 days   enalapril 20 MG tablet Commonly known as: VASOTEC Take 2 tablets (40 mg total) by mouth daily. What changed:   how much to take when to take this   Farxiga 10 MG Tabs tablet Generic drug: dapagliflozin propanediol TAKE 1 TABLET(10 MG) BY MOUTH DAILY   gentamicin ointment 0.1 % Commonly known as: GARAMYCIN Apply 1 Application topically daily.   hydrALAZINE 10 MG tablet Commonly known as: APRESOLINE TAKE 1/2 TABLET BY MOUTH THREE TIMES DAILY AS NEEDED IF BLOOD PRESSURE IS OVER 140/90   Insulin Pen Needle 31G X 5 MM Misc 1 Device by Does not apply route in the morning, at noon, in the evening, and at bedtime.   Easy Touch Pen Needles 31G X 5 MM Misc Generic drug: Insulin Pen Needle Check sugar 3-4 daily   Lantus SoloStar 100 UNIT/ML Solostar Pen Generic drug: insulin glargine Inject 40 Units into the skin at bedtime.   multivitamin capsule Take 1 capsule by mouth daily.   NIFEdipine 30 MG 24 hr tablet Commonly known as: PROCARDIA-XL/NIFEDICAL-XL TAKE 1 TABLET(30 MG) BY MOUTH DAILY What changed: See the new instructions.   NovoLOG FlexPen 100 UNIT/ML FlexPen Generic drug: insulin aspart INJECT 40 UNITS DAILY MAX   Omnipod 5 G6 Pods (Gen 5) Misc 1 Device by Does not apply route every other day.   Omnipod 5 G6 Intro (Gen 5) Kit 1 Device by Does not apply route every other day.   rosuvastatin 10 MG tablet Commonly known as: CRESTOR TAKE 1 TABLET(10 MG) BY MOUTH DAILY What changed: See the new instructions.   traMADol 50 MG tablet Commonly known as: Ultram Take 1 tablet (50 mg total) by mouth every 6 (six) hours as needed.   Vitamin D (Cholecalciferol) 10 MCG (400 UNIT) Caps Take 1 capsule by mouth daily.         ALLERGIES: No Known Allergies   REVIEW OF SYSTEMS: A comprehensive ROS was conducted with the patient and is negative except as per HPI and below:      OBJECTIVE:   VITAL SIGNS: There were no vitals taken for this visit.   PHYSICAL EXAM:  General: Pt appears well and is in NAD  Lungs: Clear with good BS bilat  Heart: RRR   Extremities:  Lower  extremities - Trace edema on the left   Neuro: MS is good with appropriate affect, pt is alert and Ox3   DM Foot Exam 02/26/2023 per  podiatry    DATA REVIEWED:  Lab Results  Component Value Date   HGBA1C 8.4 (A) 12/24/2022   HGBA1C 9.1 (A) 09/23/2022   HGBA1C 9.0 (A) 05/29/2022    Latest Reference Range & Units 01/21/23 07:39  BUN 8 - 23 mg/dL 41 (H)  Creatinine 3.23 - 1.00 mg/dL 5.57 (H)  GFR, Estimated >60 mL/min 23 (L)  (H): Data is abnormally high (L): Data is abnormally low    ASSESSMENT / PLAN / RECOMMENDATIONS:   1) Type 2 Diabetes Mellitus, Poorlly y controlled, With retinopathic, CKD IV and macrovascular complications - Most recent A1c of 9.1 %. Goal A1c < 7.0 %.    - Her A1c is skewed due to CKD - She continues with worsening glycemic control, we again discussed importance of optimizing glucose to prevent further renal damage, and promote healing of left foot  -She had declined  GLP-1 agonists in the past  - She also had declined insulin pump in the past but today I showed her the Omnipod and she agreed to it - In reviewing CGM, there's evidence of inconsistent insulin intake  -Her GFR is less than 25, hence there is no indication from the endocrinology standpoint to be on SGLT2 inhibitors, per patient this is prescribed by her nephrology so we will defer this to nephrology. - Will increase basal insulin as below      MEDICATIONS: Increase Lantus 40 units daily Continue NovoLog 5 units with breakfast, 10 units with lunch, and 12 units with supper Correction factor: NovoLog (BG -130/30)   EDUCATION / INSTRUCTIONS: BG monitoring instructions: Patient is instructed to check her blood sugars 3 times a day, before meals. Call [facility] if: BG persistently < 70 . I reviewed the Rule of 15 for the treatment of hypoglycemia in detail with the patient. Literature supplied.   2) Diabetic complications:  Eye: Does  have known diabetic retinopathy.   Neuro/ Feet: Does not have known diabetic peripheral neuropathy. Renal: Patient does have known baseline CKD. She is on an ACEI/ARB at present.    Follow-up in 6 months    Signed electronically by: Lyndle Herrlich, MD  [facility]  [facility] [address] [place] [place], [place] [address] Phone: [phone] FAX: [phone]   CC: Allegra Grana, FNP [address] [place] [address] Phone: [phone]  Fax: [phone]    Return to Endocrinology clinic as below: Future Appointments  Date Time Provider Department Center  03/19/2023  9:50 AM Jaziah Goeller, Konrad Dolores, MD [id number] None  04/02/2023  8:45 AM Lilian Kapur Rachelle Hora, DPM [facility]

## 2023-03-26 ENCOUNTER — Telehealth: Payer: Medicaid Other | Admitting: Internal Medicine

## 2023-04-02 ENCOUNTER — Ambulatory Visit: Payer: Medicaid Other | Admitting: Podiatry

## 2023-04-02 DIAGNOSIS — M79674 Pain in right toe(s): Secondary | ICD-10-CM | POA: Diagnosis not present

## 2023-04-02 DIAGNOSIS — L97522 Non-pressure chronic ulcer of other part of left foot with fat layer exposed: Secondary | ICD-10-CM

## 2023-04-02 DIAGNOSIS — B351 Tinea unguium: Secondary | ICD-10-CM

## 2023-04-02 NOTE — Progress Notes (Signed)
Subjective:  Patient ID: Chelsea Barton, female    DOB: 11/12/1960,  MRN: [id number]  Chief Complaint  Patient presents with   Wound Check    She says the wound is doing about the same changing dressing every day   Nail Problem    Nails on right foot are thick and elongated causing pain and discomfort as well as a callus      62 y.o. female returns for post-op check.  She returns for follow-up,    Review of Systems: Negative except as noted in the HPI. Denies N/V/F/Ch.   Objective:   There were no vitals filed for this visit.  There is no height or weight on file to calculate BMI. Constitutional Well developed. Well nourished.  Vascular Foot warm and well perfused. Capillary refill normal to all digits.  Calf is soft and supple, no posterior calf or knee pain, negative Homans' sign  Neurologic Normal speech. Oriented to person, place, and time. Epicritic sensation to light touch grossly absent bilaterally.  Dermatologic Central residual ulceration postdebridement measures 3.0 x 2.0 cm, no exposed bone, exposed subcutaneous tissue.  No infection, significant hyperkeratosis surrounding this.  Nails thickened elongated x 5 on the right with subungual debris and significant dystrophy and mycosis  Orthopedic: She has no tenderness to palpation noted about the surgical site.     Assessment:   1. 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 Pain due to onychomycosis of toenail of right foot     Plan:  Patient was evaluated and treated and all questions answered.  S/p foot surgery left -Wound was debrided of all nonviable tissue to the subcutaneous layer in an excisional manner full-thickness with a sharp scalpel.  Hyperkeratosis and nonviable tissue was removed.  Postdebridement measurements noted above.  Slight improvement in wound dimensions and appearance.  I will see her back in 1 month to reevaluate and for further treatment.  She should continue daily dressing  changes with the ointment.  Discussed the etiology and treatment options for the condition in detail with the patient.Recommended debridement of the nails today. Sharp and mechanical debridement performed of all painful and mycotic nails today. Nails debrided in length and thickness using a nail nipper to level of comfort. Discussed treatment options including appropriate shoe gear. Follow up as needed for painful nails.    Return in about 1 month (around 05/03/2023) for wound care.

## 2023-04-08 DIAGNOSIS — R809 Proteinuria, unspecified: Secondary | ICD-10-CM | POA: Diagnosis not present

## 2023-04-08 DIAGNOSIS — N2581 Secondary hyperparathyroidism of renal origin: Secondary | ICD-10-CM | POA: Diagnosis not present

## 2023-04-08 DIAGNOSIS — N184 Chronic kidney disease, stage 4 (severe): Secondary | ICD-10-CM | POA: Diagnosis not present

## 2023-04-08 DIAGNOSIS — E1122 Type 2 diabetes mellitus with diabetic chronic kidney disease: Secondary | ICD-10-CM | POA: Diagnosis not present

## 2023-04-08 DIAGNOSIS — I129 Hypertensive chronic kidney disease with stage 1 through stage 4 chronic kidney disease, or unspecified chronic kidney disease: Secondary | ICD-10-CM | POA: Diagnosis not present

## 2023-04-09 ENCOUNTER — Other Ambulatory Visit: Payer: Self-pay | Admitting: Family

## 2023-04-09 DIAGNOSIS — I10 Essential (primary) hypertension: Secondary | ICD-10-CM

## 2023-04-17 NOTE — Telephone Encounter (Signed)
Error

## 2023-04-30 ENCOUNTER — Ambulatory Visit: Payer: Medicaid Other | Admitting: Podiatry

## 2023-04-30 ENCOUNTER — Encounter: Payer: Self-pay | Admitting: Podiatry

## 2023-04-30 DIAGNOSIS — L97522 Non-pressure chronic ulcer of other part of left foot with fat layer exposed: Secondary | ICD-10-CM

## 2023-04-30 NOTE — Progress Notes (Signed)
  Subjective:  Patient ID: Chelsea Barton, female    DOB: 1960-09-26,  MRN: [id number]  Chief Complaint  Patient presents with   Wound Check    "It's doing okay."      62 y.o. female returns for post-op check.  She returns for follow-up and ongoing wound care has not had much drainage  Review of Systems: Negative except as noted in the HPI. Denies N/V/F/Ch.   Objective:   There were no vitals filed for this visit.  There is no height or weight on file to calculate BMI. Constitutional Well developed. Well nourished.  Vascular Foot warm and well perfused. Capillary refill normal to all digits.  Calf is soft and supple, no posterior calf or knee pain, negative Homans' sign  Neurologic Normal speech. Oriented to person, place, and time. Epicritic sensation to light touch grossly absent bilaterally.  Dermatologic Central residual ulceration postdebridement measures 3.5 x 2.2 cm, no exposed bone, exposed subcutaneous tissue.  No infection, significant hyperkeratosis surrounding this.  Nails thickened elongated x 5 on the right with subungual debris and significant dystrophy and mycosis  Orthopedic: She has no tenderness to palpation noted about the surgical site.     Assessment:   1. 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  S/p foot surgery left -Wound was debrided of all nonviable tissue to the subcutaneous layer in an excisional manner full-thickness with a sharp scalpel.  Hyperkeratosis and nonviable tissue was removed.  Postdebridement measurements noted above.  Dimensions relatively unchanged.  I will see her back in 4 weeks to reevaluate and for further treatment.  She should continue daily dressing changes with the ointment.     Return in about 4 weeks (around 05/28/2023) for wound care.

## 2023-05-06 ENCOUNTER — Other Ambulatory Visit: Payer: Self-pay | Admitting: Family

## 2023-05-06 DIAGNOSIS — Z1231 Encounter for screening mammogram for malignant neoplasm of breast: Secondary | ICD-10-CM

## 2023-05-12 ENCOUNTER — Ambulatory Visit: Payer: Medicaid Other | Admitting: Internal Medicine

## 2023-05-12 ENCOUNTER — Telehealth: Payer: Self-pay

## 2023-05-12 NOTE — Telephone Encounter (Signed)
Patient left vm saying that her appt was suppose to be a video visit today. She would like a call to reschedule

## 2023-05-12 NOTE — Progress Notes (Deleted)
Name: Chelsea Barton  MRN/ DOB: [id number], 1961/02/11   Age/ Sex: 62 y.o., female    PCP: Allegra Grana, FNP   Reason for Endocrinology Evaluation: Type 2 Diabetes Mellitus     Date of Initial Endocrinology Visit: 07/20/2021    PATIENT IDENTIFIER: Chelsea Barton is a 62 y.o. female with a past medical history of T2DM , CAD and Dyslipidemia. The patient presented for initial endocrinology clinic visit on 07/20/2021 for consultative assistance with her diabetes management.    HPI: Chelsea Barton was    Diagnosed with DM in many years ago  Prior Medications tried/Intolerance: Metformin Currently checking blood sugars multiple x / day, through CGM.  Hemoglobin A1c has ranged from 7.3% in 2020, peaking at 9.5% in 2014.  Follows with Nephrology    I prescribed the pump 09/2022 and she received the supplies , she went through the training 10/21/2022 but she did not go through with using it as she felt this was overwhelming  SUBJECTIVE:   During the last visit (09/23/2022): A1c 9.1%  Today (05/12/23): Chelsea Barton is here for a follow up on diabetes management. She checks her  blood sugars multiple  times daily. But she forgot her receiver today. The patient has had hypoglycemic episodes since the last clinic visit. Which typically occur at lunch time   She is s/p left foot graft application due to foot ulcer 09/2022,.  Last podiatry visit 04/30/2023 She is s/p left lower extremity angiography with intervention 01/21/2023 Patient continues to follow-up with [facility]  Denies nausea, vomiting or diarrhea   HOME DIABETES REGIMEN: Lantus 36 units daily NovoLog 5 units with breakfast, 10 units with lunch, and 12 units with supper Correction factor: NovoLog (BG -130/30) Farxiga 10 mg through nephrology    Statin: yes ACE-I/ARB: no   CONTINUOUS GLUCOSE MONITORING RECORD INTERPRETATION    Dates of Recording: 3/26-09/23/2022  Sensor description: dexcom  Results statistics:    CGM use % of time 93  Average and SD 243/57  Time in range      14  %  % Time Above 180 41  % Time above 250 45  % Time Below target 0   Glycemic patterns summary: hyperglycemia noted during the day and night   Hyperglycemic episodes  all day and night   Hypoglycemic episodes occurred n/a  Overnight periods: variable     DIABETIC COMPLICATIONS: Microvascular complications:  CKD IV, retinopathy, S/P left metatarsal amputation  Denies: neuropathy  Last eye exam: Completed 2022  Macrovascular complications:  CAD, PVD (s/p left lower extremity angio 01/2023) Denies:   CVA   PAST HISTORY: Past Medical History:  Past Medical History:  Diagnosis Date   Aortic atherosclerosis (HCC)    CKD (chronic kidney disease), stage IV (HCC)    not on dialysis   COPD (chronic obstructive pulmonary disease) (HCC)    Coronary artery disease    Diastolic dysfunction    a.) TTE 07/18/2021: EF 50-55%, moderate LVH, mild LAE, mild-mod MR, AoV sclerosis, G1DD   Esophageal thickening    Hyperlipidemia    Hypertension    Impaired vision    LVH (left ventricular hypertrophy) due to hypertensive disease, without heart failure    Obesity    Osteomyelitis of left foot (HCC)    a.) s/p LEFT transmetatarsal amputation   PAD (peripheral artery disease) (HCC)    a.) s/p PTA and stenting of LEFT SFA 04/13/2021 --> 6 x 200 mm LifeStent with distal edge just  beyond Hunter's canal extending proximally and a 6 x 80 mm LifeStent to the origin of the SFA (stents overlapped by approximately 10 mm); b.) LE vascular duplex 09/26/2022 --> 75-99% stenosis LEFT SFA.   Proteinuria    Secondary hyperparathyroidism of renal origin (HCC)    Type 2 diabetes mellitus treated with insulin (HCC)    a.) uses Dexcom CGM and OmniPod insulin pump   Past Surgical History:  Past Surgical History:  Procedure Laterality Date   AMPUTATION TOE  04/10/2021   Procedure: AMPUTATION 2nd TOE, left foot;  Surgeon: Felecia Shelling,  DPM;  Location: [facility];  Service: Podiatry;;   CATARACT EXTRACTION     eye surgery r Right    lense placed   GRAFT APPLICATION Left 12/26/2021   Procedure: GRAFT APPLICATION-left foot;  Surgeon: Edwin Cap, DPM;  Location: [facility];  Service: Podiatry;  Laterality: Left;   GRAFT APPLICATION Left 10/04/2022   Procedure: GRAFT APPLICATION;  Surgeon: Edwin Cap, DPM;  Location: [facility];  Service: Podiatry;  Laterality: Left;   I & D EXTREMITY Left 12/26/2021   Procedure: IRRIGATION AND DEBRIDEMENT EXTREMITY-left foot;  Surgeon: Edwin Cap, DPM;  Location: [facility];  Service: Podiatry;  Laterality: Left;   INCISION AND DRAINAGE Left 04/18/2021   Procedure: INCISION AND DRAINAGE;  Surgeon: Edwin Cap, DPM;  Location: [facility];  Service: Podiatry;  Laterality: Left;   INCISION AND DRAINAGE OF WOUND Left 04/10/2021   Procedure: IRRIGATION AND DEBRIDEMENT WOUND;  Surgeon: Felecia Shelling, DPM;  Location: [facility];  Service: Podiatry;  Laterality: Left;   LOWER EXTREMITY ANGIOGRAPHY Left 01/21/2023   Procedure: Lower Extremity Angiography;  Surgeon: Renford Dills, MD;  Location: [facility];  Service: Cardiovascular;  Laterality: Left;   PERIPHERAL VASCULAR BALLOON ANGIOPLASTY Left 04/13/2021   Procedure: PERIPHERAL VASCULAR BALLOON ANGIOPLASTY;  Surgeon: Renford Dills, MD;  Location: [facility];  Service: Cardiovascular;  Laterality: Left;   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   Procedure: TRANSMETATARSAL AMPUTATION;  Surgeon: Candelaria Stagers, DPM;  Location: [facility];  Service: Podiatry;  Laterality: Left;   WOUND DEBRIDEMENT Left 10/04/2022   Procedure: DEBRIDEMENT WOUND;  Surgeon: Edwin Cap, DPM;  Location: [facility];  Service: Podiatry;  Laterality: Left;    Social History:  reports that she quit smoking about 17 years ago. Her smoking use included cigarettes. She started smoking about 42 years ago. She has a 25 pack-year smoking  history. She has never used smokeless tobacco. She reports that she does not drink alcohol and does not use drugs. Family History:  Family History  Problem Relation Age of Onset   Kidney disease Sister    Diabetes Sister        3/5 sisters have DM   Breast cancer Neg Hx    Colon cancer Neg Hx      HOME MEDICATIONS: Allergies as of 05/12/2023   No Known Allergies      Medication List        Accurate as of May 12, 2023  7:16 AM. If you have any questions, ask your nurse or doctor.          Accu-Chek Aviva Plus test strip Generic drug: glucose blood Used to check blood sugar 4x daily.   Accu-Chek Softclix Lancets lancets 3 (three) times daily.   amoxicillin-clavulanate 875-125 MG tablet Commonly known as: AUGMENTIN Take 1 tablet by mouth 2 (two) times daily.   aspirin EC 81 MG  tablet Take 1 tablet (81 mg total) by mouth daily. Swallow whole.   Blood Glucose Monitoring Suppl Devi 1 each by Does not apply route in the morning, at noon, and at bedtime. May substitute to any manufacturer covered by patient's insurance.   carvedilol 12.5 MG tablet Commonly known as: COREG TAKE 1 TABLET(12.5 MG TOTAL) BY MOUTH 2 TIMES DAILY WITH A MEAL   clopidogrel 75 MG tablet Commonly known as: Plavix Take 1 tablet (75 mg total) by mouth daily.   Dexcom G6 Receiver Devi Use as directed   Dexcom G6 Sensor Misc PLACE A NEW SENSOR EVERY 10 DAYS. USE TO CHECK BLOOD SUGAR AT LEAST 4 TIMES DAILY   Dexcom G6 Transmitter Misc Inject 1 Device into the skin as directed. Every 10 days   enalapril 20 MG tablet Commonly known as: VASOTEC Take 2 tablets (40 mg total) by mouth daily. What changed:  how much to take when to take this   Farxiga 10 MG Tabs tablet Generic drug: dapagliflozin propanediol TAKE 1 TABLET(10 MG) BY MOUTH DAILY   hydrALAZINE 10 MG tablet Commonly known as: APRESOLINE TAKE 1/2 TABLET BY MOUTH THREE TIMES DAILY AS NEEDED IF BLOOD PRESSURE IS OVER  140/90   Insulin Pen Needle 31G X 5 MM Misc 1 Device by Does not apply route in the morning, at noon, in the evening, and at bedtime.   Easy Touch Pen Needles 31G X 5 MM Misc Generic drug: Insulin Pen Needle Check sugar 3-4 daily   Lantus SoloStar 100 UNIT/ML Solostar Pen Generic drug: insulin glargine Inject 40 Units into the skin at bedtime.   multivitamin capsule Take 1 capsule by mouth daily.   NIFEdipine 30 MG 24 hr tablet Commonly known as: PROCARDIA-XL/NIFEDICAL-XL TAKE 1 TABLET(30 MG) BY MOUTH DAILY What changed: See the new instructions.   NovoLOG FlexPen 100 UNIT/ML FlexPen Generic drug: insulin aspart INJECT 40 UNITS DAILY MAX What changed: additional instructions   Omnipod 5 DexG7G6 Pods Gen 5 Misc 1 Device by Does not apply route every other day.   Omnipod 5 DexG7G6 Intro Gen 5 Kit 1 Device by Does not apply route every other day.   rosuvastatin 10 MG tablet Commonly known as: CRESTOR TAKE 1 TABLET(10 MG) BY MOUTH DAILY What changed: See the new instructions.   traMADol 50 MG tablet Commonly known as: Ultram Take 1 tablet (50 mg total) by mouth every 6 (six) hours as needed.   Vitamin D (Cholecalciferol) 10 MCG (400 UNIT) Caps Take 1 capsule by mouth daily.         ALLERGIES: No Known Allergies   REVIEW OF SYSTEMS: A comprehensive ROS was conducted with the patient and is negative except as per HPI and below:      OBJECTIVE:   VITAL SIGNS: There were no vitals taken for this visit.   PHYSICAL EXAM:  General: Pt appears well and is in NAD  Lungs: Clear with good BS bilat  Heart: RRR   Extremities:  Lower extremities - Trace edema on the left   Neuro: MS is good with appropriate affect, pt is alert and Ox3   DM Foot Exam 04/30/2023 per podiatry    DATA REVIEWED:  Lab Results  Component Value Date   HGBA1C 8.4 (A) 12/24/2022   HGBA1C 9.1 (A) 09/23/2022   HGBA1C 9.0 (A) 05/29/2022     Latest Reference Range & Units 01/21/23  07:39  BUN 8 - 23 mg/dL 41 (H)  Creatinine 1.61 - 1.00 mg/dL 0.96 (H)  GFR, Estimated >60 mL/min 23 (L)  (H): Data is abnormally high (L): Data is abnormally low  ASSESSMENT / PLAN / RECOMMENDATIONS:   1) Type 2 Diabetes Mellitus, Poorlly y controlled, With retinopathic, CKD IV and macrovascular complications - Most recent A1c of 9.1 %. Goal A1c < 7.0 %.    - Her A1c is skewed due to CKD - She continues with worsening glycemic control, we again discussed importance of optimizing glucose to prevent further renal damage, and promote healing of left foot  -She had declined  GLP-1 agonists in the past  - She also had declined insulin pump in the past but today I showed her the Omnipod and she agreed to it - In reviewing CGM, there's evidence of inconsistent insulin intake  -Her GFR is less than 25, hence there is no indication from the endocrinology standpoint to be on SGLT2 inhibitors, per patient this is prescribed by her nephrology so we will defer this to nephrology. - Will increase basal insulin as below      MEDICATIONS: Increase Lantus 40 units daily Continue NovoLog 5 units with breakfast, 10 units with lunch, and 12 units with supper Correction factor: NovoLog (BG -130/30)   EDUCATION / INSTRUCTIONS: BG monitoring instructions: Patient is instructed to check her blood sugars 3 times a day, before meals. Call [facility] if: BG persistently < 70 . I reviewed the Rule of 15 for the treatment of hypoglycemia in detail with the patient. Literature supplied.   2) Diabetic complications:  Eye: Does  have known diabetic retinopathy.  Neuro/ Feet: Does not have known diabetic peripheral neuropathy. Renal: Patient does have known baseline CKD. She is on an ACEI/ARB at present.  3) Left Forefoot Amputation:   - Per podiatry    Follow-up in 6 months    Signed electronically by: Lyndle Herrlich, MD  [facility]  [facility] [address] [place] [place], [place] [address] Phone: [phone] FAX: [phone]   CC: Allegra Grana, FNP [address] [place] [address] Phone: [phone]  Fax: [phone]    Return to Endocrinology clinic as below: Future Appointments  Date Time Provider Department Center  05/12/2023 10:50 AM Kory Rains, Konrad Dolores, MD [facility] None  06/04/2023 10:00 AM [facility]  06/04/2023  3:00 PM McDonald, Rachelle Hora, DPM [facility]

## 2023-05-20 ENCOUNTER — Telehealth: Payer: Self-pay | Admitting: Internal Medicine

## 2023-05-20 NOTE — Telephone Encounter (Signed)
[facility] care nurse called and asked if patient could schedule another MyChart visit, due to bad connection, please advise id another MyChart visit can be scheduled or does patient need to be seen in office?

## 2023-05-28 ENCOUNTER — Ambulatory Visit: Payer: Medicaid Other | Admitting: Podiatry

## 2023-06-04 ENCOUNTER — Encounter: Payer: Self-pay | Admitting: Podiatry

## 2023-06-04 ENCOUNTER — Ambulatory Visit
Admission: RE | Admit: 2023-06-04 | Discharge: 2023-06-04 | Disposition: A | Discharge status: Home / Self Care | Payer: Medicaid Other | Source: Ambulatory Visit | Attending: Family | Admitting: Family

## 2023-06-04 ENCOUNTER — Ambulatory Visit (INDEPENDENT_AMBULATORY_CARE_PROVIDER_SITE_OTHER): Payer: Medicaid Other | Admitting: Podiatry

## 2023-06-04 DIAGNOSIS — Z1231 Encounter for screening mammogram for malignant neoplasm of breast: Secondary | ICD-10-CM | POA: Diagnosis not present

## 2023-06-04 DIAGNOSIS — L97522 Non-pressure chronic ulcer of other part of left foot with fat layer exposed: Secondary | ICD-10-CM

## 2023-06-08 NOTE — Progress Notes (Signed)
  Subjective:  Patient ID: Chelsea Barton, female    DOB: [birth year],  MRN: [id number]  Chief Complaint  Patient presents with   Wound Check    "It's doing."      62 y.o. female returns for post-op check.  She returns for follow-up and ongoing wound care    Review of Systems: Negative except as noted in the HPI. Denies N/V/F/Ch.   Objective:   There were no vitals filed for this visit.  There is no height or weight on file to calculate BMI. Constitutional Well developed. Well nourished.  Vascular Foot warm and well perfused. Capillary refill normal to all digits.  Calf is soft and supple, no posterior calf or knee pain, negative Homans' sign  Neurologic Normal speech. Oriented to person, place, and time. Epicritic sensation to light touch grossly absent bilaterally.  Dermatologic Central residual ulceration postdebridement measures 3.0 x 2.0 cm, no exposed bone, exposed subcutaneous tissue.  No infection, significant hyperkeratosis surrounding this.  Nails thickened elongated x 5 on the right with subungual debris and significant dystrophy and mycosis  Orthopedic: She has no tenderness to palpation noted about the surgical site.     Assessment:   1. 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  S/p foot surgery left -Wound was debrided of all nonviable tissue to the subcutaneous layer in an excisional manner full-thickness with a sharp scalpel.  Hyperkeratosis and nonviable tissue was removed.  Postdebridement measurements noted above.  Dimensions relatively unchanged.  I will see her back in 4 weeks to reevaluate and for further treatment.  She should continue daily dressing changes with the ointment.  Expect this wound will be chronic and progression slow, she is not ready or willing to undergo further amputation at this point     Return in about 1 month (around 07/05/2023) for wound care.

## 2023-07-02 ENCOUNTER — Encounter: Payer: Self-pay | Admitting: Podiatry

## 2023-07-02 ENCOUNTER — Ambulatory Visit: Payer: Medicaid Other | Admitting: Podiatry

## 2023-07-02 VITALS — Ht 65.0 in | Wt 207.9 lb

## 2023-07-02 DIAGNOSIS — L97522 Non-pressure chronic ulcer of other part of left foot with fat layer exposed: Secondary | ICD-10-CM | POA: Diagnosis not present

## 2023-07-02 NOTE — Progress Notes (Signed)
  Subjective:  Patient ID: Chelsea Barton, female    DOB: [birth year],  MRN: [id number]  Chief Complaint  Patient presents with   Wound Check    Pt is here for routine wound care on her left foot.      63 y.o. female returns for post-op check.  She returns for follow-up and ongoing wound care    Review of Systems: Negative except as noted in the HPI. Denies N/V/F/Ch.   Objective:   There were no vitals filed for this visit.  Body mass index is 34.59 kg/m. Constitutional Well developed. Well nourished.  Vascular Foot warm and well perfused. Capillary refill normal to all digits.  Calf is soft and supple, no posterior calf or knee pain, negative Homans' sign  Neurologic Normal speech. Oriented to person, place, and time. Epicritic sensation to light touch grossly absent bilaterally.  Dermatologic Central residual ulceration postdebridement measures 3.5 x 2.5 cm, no exposed bone, exposed subcutaneous tissue.  No infection, significant hyperkeratosis surrounding this.  Nails thickened elongated x 5 on the right with subungual debris and significant dystrophy and mycosis  Orthopedic: She has no tenderness to palpation noted about the surgical site.     Assessment:   1. 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  S/p foot surgery left -Wound was debrided of all nonviable tissue to the subcutaneous layer in an excisional manner full-thickness with a sharp scalpel.  Hyperkeratosis and nonviable tissue was removed.  Postdebridement measurements noted above.  It was dressed with a collagen bandage and foam border dressing she will leave intact for 2 days.  Dimensions relatively unchanged.  I will see her back in 4 weeks to reevaluate and for further treatment.  She should continue daily dressing changes with the ointment.  Expect this wound will be chronic and progression slow, she is not ready or willing to  undergo further amputation at this point     Return in about 4 weeks (around 07/30/2023) for wound care.

## 2023-07-04 ENCOUNTER — Other Ambulatory Visit: Payer: Self-pay | Admitting: Family

## 2023-07-04 ENCOUNTER — Telehealth: Payer: Self-pay | Admitting: Family

## 2023-07-04 DIAGNOSIS — E11319 Type 2 diabetes mellitus with unspecified diabetic retinopathy without macular edema: Secondary | ICD-10-CM

## 2023-07-04 DIAGNOSIS — E785 Hyperlipidemia, unspecified: Secondary | ICD-10-CM

## 2023-07-04 DIAGNOSIS — E1039 Type 1 diabetes mellitus with other diabetic ophthalmic complication: Secondary | ICD-10-CM

## 2023-07-04 MED ORDER — INSULIN PEN NEEDLE 31G X 5 MM MISC: 1.0000 | Freq: Four times a day (QID) | 2 refills | Status: AC

## 2023-07-04 MED ORDER — LANTUS SOLOSTAR 100 UNIT/ML ~~LOC~~ SOPN
40.0000 [IU] | PEN_INJECTOR | Freq: Every evening | SUBCUTANEOUS | 1 refills | Status: AC
Start: 2023-07-04 — End: ?

## 2023-07-04 MED ORDER — DEXCOM G6 SENSOR MISC
1 refills | Status: AC
Start: 2023-07-04 — End: ?

## 2023-07-04 MED ORDER — ACCU-CHEK SOFTCLIX LANCETS MISC: 2 refills | Status: AC

## 2023-07-04 NOTE — Telephone Encounter (Signed)
Sent in supplies to pharmacy pt has been notified as well

## 2023-07-04 NOTE — Telephone Encounter (Signed)
Copied from CRM [id number]. Topic: Clinical - Medication Refill >> Jul 04, 2023  2:56 PM Chelsea Barton wrote: Patient is requesting refills on all of her diabetic supplies.

## 2023-07-08 ENCOUNTER — Telehealth: Payer: Self-pay

## 2023-07-08 DIAGNOSIS — E1122 Type 2 diabetes mellitus with diabetic chronic kidney disease: Secondary | ICD-10-CM | POA: Diagnosis not present

## 2023-07-08 DIAGNOSIS — N2581 Secondary hyperparathyroidism of renal origin: Secondary | ICD-10-CM | POA: Diagnosis not present

## 2023-07-08 DIAGNOSIS — N184 Chronic kidney disease, stage 4 (severe): Secondary | ICD-10-CM | POA: Diagnosis not present

## 2023-07-08 DIAGNOSIS — N1832 Chronic kidney disease, stage 3b: Secondary | ICD-10-CM | POA: Diagnosis not present

## 2023-07-08 DIAGNOSIS — I129 Hypertensive chronic kidney disease with stage 1 through stage 4 chronic kidney disease, or unspecified chronic kidney disease: Secondary | ICD-10-CM | POA: Diagnosis not present

## 2023-07-08 DIAGNOSIS — R809 Proteinuria, unspecified: Secondary | ICD-10-CM | POA: Diagnosis not present

## 2023-07-08 NOTE — Telephone Encounter (Signed)
PA for Dexcom G6 sensor is needed.

## 2023-07-09 ENCOUNTER — Encounter: Payer: Self-pay | Admitting: Family

## 2023-07-09 ENCOUNTER — Other Ambulatory Visit: Payer: Self-pay | Admitting: Family

## 2023-07-09 ENCOUNTER — Ambulatory Visit: Payer: Self-pay | Admitting: Family

## 2023-07-09 DIAGNOSIS — E785 Hyperlipidemia, unspecified: Secondary | ICD-10-CM

## 2023-07-09 DIAGNOSIS — E1039 Type 1 diabetes mellitus with other diabetic ophthalmic complication: Secondary | ICD-10-CM

## 2023-07-09 MED ORDER — ROSUVASTATIN CALCIUM 10 MG PO TABS: 10.0000 mg | ORAL_TABLET | Freq: Every day | ORAL | 3 refills | Status: DC

## 2023-07-09 NOTE — Telephone Encounter (Signed)
Copied from CRM [id number]. Topic: Clinical - Medication Refill >> Jul 09, 2023 11:16 AM Benetta Spar A wrote: Most Recent Primary Care Visit:  Provider: Allegra Grana  Department: [facility]  Visit Type: OFFICE VISIT  Date: 12/24/2022  Medication: Continuous Glucose Sensor (DEXCOM G6 SENSOR) MISC; rosuvastatin (CRESTOR) 10 MG tablet   Has the patient contacted their pharmacy? Yes (Agent: If no, request that the patient contact the pharmacy for the refill. If patient does not wish to contact the pharmacy document the reason why and proceed with request.) (Agent: If yes, when and what did the pharmacy advise?)  Is this the correct pharmacy for this prescription? Yes If no, delete pharmacy and type the correct one.  This is the patient's preferred pharmacy:  [id number] - [place], [place] - [address] AT [facility] [address] [facility] [address] [place] [address] Phone: [phone] Fax: [phone]   Has the prescription been filled recently? No  Is the patient out of the medication? Yes  Has the patient been seen for an appointment in the last year OR does the patient have an upcoming appointment? Yes  Can we respond through MyChart? No  Agent: Please be advised that Rx refills may take up to 3 business days. We ask that you follow-up with your pharmacy.

## 2023-07-09 NOTE — Telephone Encounter (Signed)
 This encounter was created in error - please disregard.

## 2023-07-10 ENCOUNTER — Other Ambulatory Visit (HOSPITAL_COMMUNITY): Payer: Self-pay

## 2023-07-10 ENCOUNTER — Ambulatory Visit: Payer: Medicaid Other | Admitting: Family

## 2023-07-11 NOTE — Telephone Encounter (Signed)
Spoke to pt and notified her of pending status for Dexcom G6 will contact her as soon as I hear back from them

## 2023-07-16 ENCOUNTER — Encounter: Payer: Self-pay | Admitting: Family

## 2023-07-16 ENCOUNTER — Ambulatory Visit: Payer: Medicaid Other | Admitting: Family

## 2023-07-16 ENCOUNTER — Telehealth: Payer: Self-pay

## 2023-07-16 VITALS — BP 136/84 | HR 78 | Temp 97.6°F | Ht 65.0 in | Wt 208.2 lb

## 2023-07-16 DIAGNOSIS — N184 Chronic kidney disease, stage 4 (severe): Secondary | ICD-10-CM

## 2023-07-16 DIAGNOSIS — E1122 Type 2 diabetes mellitus with diabetic chronic kidney disease: Secondary | ICD-10-CM | POA: Diagnosis not present

## 2023-07-16 DIAGNOSIS — R7309 Other abnormal glucose: Secondary | ICD-10-CM

## 2023-07-16 DIAGNOSIS — I10 Essential (primary) hypertension: Secondary | ICD-10-CM

## 2023-07-16 DIAGNOSIS — Z794 Long term (current) use of insulin: Secondary | ICD-10-CM | POA: Diagnosis not present

## 2023-07-16 LAB — POCT GLYCOSYLATED HEMOGLOBIN (HGB A1C): Hemoglobin A1C: 10.3 % — AB (ref 4.0–5.6)

## 2023-07-16 MED ORDER — HYDRALAZINE HCL 10 MG PO TABS: ORAL_TABLET | ORAL | 1 refills | Status: DC

## 2023-07-16 NOTE — Assessment & Plan Note (Addendum)
Severely uncontrolled Lost to follow-up with Dr Lonzo Cloud.  Have sent a staff message to collaborate in regards to care.  Referral replaced to [facility] endocrine so patient can establish locally she does not drive. History of DM1 and , also DM2 upon review of chart. She put the Dexcom back on 2 days ago so we were unable to download data today.  Plan to do so next week when she comes back in the office.  Continue Lantus 40 units daily; novolog 5 units with breakfast, 10 units with lunch and 12 units with supper, farxiga 10mg  every day as managed by nephrology  She is unable not using an insulin pump due to type of cell phone she has per patient.   Counseled on low glycemic diet, avoidance of candy Consider Ozempic , will await Dr Lonzo Cloud advice.

## 2023-07-16 NOTE — Telephone Encounter (Signed)
Contact patient to schedule

## 2023-07-16 NOTE — Patient Instructions (Signed)
I am considering starting Ozempic, once a week non-insulin injectable.  I have sent a message Dr Lonzo Cloud to collaborate with her.  We will give you a call

## 2023-07-16 NOTE — Progress Notes (Signed)
Assessment & Plan:  Type 2 diabetes mellitus with stage 4 chronic kidney disease, with long-term current use of insulin (HCC) Assessment & Plan: Severely uncontrolled Lost to follow-up with Dr Lonzo Cloud.  Have sent a staff message to collaborate in regards to care.  Referral replaced to [facility] endocrine so patient can establish locally she does not drive. History of DM1 and , also DM2 upon review of chart. She put the Dexcom back on 2 days ago so we were unable to download data today.  Plan to do so next week when she comes back in the office.  Continue Lantus 40 units daily; novolog 5 units with breakfast, 10 units with lunch and 12 units with supper, farxiga 10mg  every day as managed by nephrology  She is unable not using an insulin pump due to type of cell phone she has per patient.   Counseled on low glycemic diet, avoidance of candy Consider Ozempic , will await Dr Lonzo Cloud advice.   Orders: -     POCT glycosylated hemoglobin (Hb A1C)  Elevated glucose -     POCT glycosylated hemoglobin (Hb A1C)  Chronic kidney disease, stage IV (severe) (HCC)  Primary hypertension Assessment & Plan: Chronic,stable. Continue carvedilol 12.5 mg twice daily, enalapril 40 mg, nifedipine 30mg  QD, Farxiga 10 mg. Hydralazine 1/2 tablet PRN TID, fortunately she has not had to use.    Orders: -     hydrALAZINE HCl; TAKE 1/2 TABLET BY MOUTH THREE TIMES DAILY AS NEEDED IF BLOOD PRESSURE IS OVER 140/90  Dispense: 30 tablet; Refill: 1 -     Ambulatory referral to Endocrinology     Return precautions given.   Risks, benefits, and alternatives of the medications and treatment plan prescribed today were discussed, and patient expressed understanding.   Education regarding symptom management and diagnosis given to patient on AVS either electronically or printed.  No follow-ups on file.  Rennie Plowman, FNP  Subjective:    Patient ID: Chelsea Barton, female    DOB: 31-Dec-1960, 63 y.o.   MRN:  [id number]  CC: Chelsea Barton is a 62 y.o. female who presents today for follow up.   HPI: Feels well today.   No new complaints.    Denies chest pain, shortness of breath   Patient with history of type 2 diabetes.  Follows endocrinology, Dr Lonzo Cloud; lost to follow-up.  Last seen 09/23/2022 Increased basal insulin to 40 units daily  She is wearing Dexcom; she put on yesterday. She has a glucometer at home, she reports a 'couple of days ago' , FBG 120.   Endorses indiscretion with candy  She doesn't drink juice.   compliant with novolog 5 units with breakfast, 10 units with lunch and 12 units with supper She is not using an insulin pump.   She was diagnosed with diabetes at age 80, type I.  Previously followed with Dr. Tedd Sias.   Last seen by [facility] endocrinology Dr Tedd Sias 02/13/2021 Adjust dose of NovoLog to: 6 units before breakfast and 12 units before lunch and supper.  She was on 40 units of Lantus daily   No personal or family history of medullary thyroid cancer, multiple endocrine neoplasia   allergies: Patient has no known allergies. Current Outpatient Medications on File Prior to Visit  Medication Sig Dispense Refill   Accu-Chek Softclix Lancets lancets Use as directed 100 each 2   amoxicillin-clavulanate (AUGMENTIN) 875-125 MG tablet Take 1 tablet by mouth 2 (two) times daily. 14 tablet 0   aspirin  81 MG EC tablet Take 1 tablet (81 mg total) by mouth daily. Swallow whole. 30 tablet 12   Blood Glucose Monitoring Suppl DEVI 1 each by Does not apply route in the morning, at noon, and at bedtime. May substitute to any manufacturer covered by patient's insurance. 1 each 0   carvedilol (COREG) 12.5 MG tablet TAKE 1 TABLET(12.5 MG TOTAL) BY MOUTH 2 TIMES DAILY WITH A MEAL 180 tablet 1   clopidogrel (PLAVIX) 75 MG tablet Take 1 tablet (75 mg total) by mouth daily. 30 tablet 5   Continuous Blood Gluc Transmit (DEXCOM G6 TRANSMITTER) MISC Inject 1 Device into the skin as  directed. Every 10 days 1 each 3   Continuous Glucose Receiver (DEXCOM G6 RECEIVER) DEVI Use as directed 1 each 0   Continuous Glucose Sensor (DEXCOM G6 SENSOR) MISC PLACE A NEW SENSOR EVERY 10 DAYS. USE TO CHECK BLOOD SUGAR AT LEAST 4 TIMES DAILY 9 each 1   dapagliflozin propanediol (FARXIGA) 10 MG TABS tablet TAKE 1 TABLET(10 MG) BY MOUTH DAILY 90 tablet 3   enalapril (VASOTEC) 20 MG tablet Take 2 tablets (40 mg total) by mouth daily. (Patient taking differently: Take 20 mg by mouth 2 (two) times daily.) 180 tablet 3   glucose blood (ACCU-CHEK AVIVA PLUS) test strip Used to check blood sugar 4x daily. 200 each 12   insulin glargine (LANTUS SOLOSTAR) 100 UNIT/ML Solostar Pen Inject 40 Units into the skin at bedtime. 30 mL 1   Insulin Pen Needle (EASY TOUCH PEN NEEDLES) 31G X 5 MM MISC Check sugar 3-4 daily 100 each 2   Insulin Pen Needle 31G X 5 MM MISC 1 Device by Does not apply route in the morning, at noon, in the evening, and at bedtime. 400 each 2   Multiple Vitamin (MULTIVITAMIN) capsule Take 1 capsule by mouth daily.     NIFEdipine (PROCARDIA-XL/NIFEDICAL-XL) 30 MG 24 hr tablet TAKE 1 TABLET(30 MG) BY MOUTH DAILY (Patient taking differently: 30 mg every morning.) 30 tablet 12   NOVOLOG FLEXPEN 100 UNIT/ML FlexPen INJECT 40 UNITS DAILY MAX (Patient taking differently: 5 breakfast- 10 lunch- 12 at night    40 UNITS DAILY MAX) 15 mL 5   olmesartan (BENICAR) 40 MG tablet Take by mouth.     rosuvastatin (CRESTOR) 10 MG tablet Take 1 tablet (10 mg total) by mouth daily. 90 tablet 3   traMADol (ULTRAM) 50 MG tablet Take 1 tablet (50 mg total) by mouth every 6 (six) hours as needed. 20 tablet 0   Vitamin D, Cholecalciferol, 10 MCG (400 UNIT) CAPS Take 1 capsule by mouth daily.     No current facility-administered medications on file prior to visit.    Review of Systems  Constitutional:  Negative for chills and fever.  Respiratory:  Negative for cough.   Cardiovascular:  Negative for chest  pain and palpitations.  Gastrointestinal:  Negative for nausea and vomiting.      Objective:    BP 136/84   Pulse 78   Temp 97.6 F (36.4 C) (Oral)   Ht 5\' 5"  (1.651 m)   Wt 208 lb 3.2 oz (94.4 kg)   SpO2 98%   BMI 34.65 kg/m  BP Readings from Last 3 Encounters:  07/16/23 136/84  01/21/23 (!) 140/86  01/08/23 (!) 225/100   Wt Readings from Last 3 Encounters:  07/16/23 208 lb 3.2 oz (94.4 kg)  07/02/23 207 lb 14.2 oz (94.3 kg)  01/21/23 207 lb 14.3 oz (94.3 kg)  Physical Exam Vitals reviewed.  Constitutional:      Appearance: She is well-developed.  Eyes:     Conjunctiva/sclera: Conjunctivae normal.  Neck:     Thyroid: No thyroid mass, thyromegaly or thyroid tenderness.  Cardiovascular:     Rate and Rhythm: Normal rate and regular rhythm.     Pulses: Normal pulses.     Heart sounds: Normal heart sounds.  Pulmonary:     Effort: Pulmonary effort is normal.     Breath sounds: Normal breath sounds. No wheezing, rhonchi or rales.  Skin:    General: Skin is warm and dry.  Neurological:     Mental Status: She is alert.  Psychiatric:        Speech: Speech normal.        Behavior: Behavior normal.        Thought Content: Thought content normal.

## 2023-07-16 NOTE — Assessment & Plan Note (Signed)
Chronic, stable.  Continue carvedilol 12.5 mg twice daily, enalapril 40 mg, nifedipine 30mg  QD, Farxiga 10 mg. Hydralazine 1/2 tablet PRN TID, fortunately she has not had to use.

## 2023-07-18 ENCOUNTER — Telehealth: Payer: Self-pay | Admitting: Family

## 2023-07-18 ENCOUNTER — Encounter: Payer: Self-pay | Admitting: Family

## 2023-07-18 DIAGNOSIS — E11319 Type 2 diabetes mellitus with unspecified diabetic retinopathy without macular edema: Secondary | ICD-10-CM

## 2023-07-18 MED ORDER — SEMAGLUTIDE(0.25 OR 0.5MG/DOS) 2 MG/3ML ~~LOC~~ SOPN: 0.2500 mg | PEN_INJECTOR | SUBCUTANEOUS | 2 refills | Status: AC

## 2023-07-18 NOTE — Telephone Encounter (Signed)
Call pt  I collaborated with endocrinology.  I would like for her to start Ozempic.  Please ask her to review MyChart as I have a message in regards to medication, side effects.  Sch f/u with me in one month         Shamleffer, Konrad Dolores, MD  Allegra Grana, FNP If she is agreeing to [facility] please start her ,  she has declined this in the past with me. With an A1c 10.0% I just don't think she is taking her insulin on regular basis   Thanks  Abby       Previous Messages    ----- Message ----- From: Allegra Grana, FNP Sent: 07/16/2023  11:12 AM EST To: Orland Penman, MD Subject: Starting ozempic?                              Dr Lonzo Cloud,  [facility] you are well.  I want to collaborate in reagrds to this patient.  She has had difficulty in recent visits, missing visits with you.She does not drive and is found it really hard to get a ride to [place] and get on virtual visits.  She previously had followed with Dr Tedd Sias here but insurance stopped covering Dr Tedd Sias.  I am going to send in a new referral Dr Tedd Sias to see if she can reestablish care for convenience/ease for patient.   In the meantime, she would like to follow with you.   A1c today is quite elevated 10.3  In reviewing her previous notes.  You had mentioned Ozempic.  She has a history of DM1 and , also DM2 upon review of chart.  I am more than happy to start Ozempic but certainly wanted your advice prior to.  She put the Dexcom back on 2 days ago so we were unable to download data today.  Plan to do so next week when she comes back in the office.  Current DM regimen:   Lantus 40 units daily  novolog 5 units with breakfast, 10 units with lunch and 12 units with supper  She is not using an insulin pump.  No personal or family history of medullary thyroid cancer, MEN.   Claris Che

## 2023-07-21 ENCOUNTER — Telehealth: Payer: Self-pay | Admitting: Family

## 2023-07-21 ENCOUNTER — Telehealth: Payer: Self-pay | Admitting: Pharmacist

## 2023-07-21 ENCOUNTER — Other Ambulatory Visit (HOSPITAL_COMMUNITY): Payer: Self-pay

## 2023-07-21 NOTE — Telephone Encounter (Signed)
Please look up a customer service number for the Dexcom G6 & give to patient. We will also reach out to rep for advise via email.

## 2023-07-21 NOTE — Telephone Encounter (Signed)
Pharmacy Patient Advocate Encounter  Received notification from [facility] that Prior Authorization for Ozempic (0.25 or 0.5 MG/DOSE) 2MG /3ML pen-injectors has been APPROVED from 07/21/2023 to 07/20/2024. Ran test claim, Copay is $4.00. This test claim was processed through [facility]- copay amounts may vary at other pharmacies due to pharmacy/plan contracts, or as the patient moves through the different stages of their insurance plan.   PA #/Case ID/Reference #: [id number]

## 2023-07-21 NOTE — Telephone Encounter (Signed)
Spoke to pt she stated that she needs new sensor because there was blood coming in hers when she put it on today, therefore she had to replace it so she will be with out a sensor for 2 weeks, would like to know what she can do. Can not get refill until the 23rd

## 2023-07-21 NOTE — Telephone Encounter (Signed)
LVM to  call back to go over notes below

## 2023-07-21 NOTE — Telephone Encounter (Signed)
Copied from CRM [id number]. Topic: Clinical - Medical Advice >> Jul 21, 2023  4:31 PM Geneva B wrote: Reason for CRM: patient is calling in her g6 stop working and needs a new one please call patient [phone]

## 2023-07-21 NOTE — Telephone Encounter (Signed)
Pharmacy Patient Advocate Encounter   Received notification from Physician's Office that prior authorization for Ozempic (0.25 or 0.5 MG/DOSE) 2MG /3ML pen-injectors is required/requested.   Insurance verification completed.   The patient is insured through [facility] .   Per test claim: PA required; PA submitted to above mentioned insurance via CoverMyMeds Key/confirmation #/EOC [id number] Status is pending

## 2023-07-21 NOTE — Telephone Encounter (Signed)
Spoke to pt she stated that she is agreeable to beginning Ozempic and she has appt with GI for this month

## 2023-07-22 NOTE — Telephone Encounter (Signed)
 noted

## 2023-07-22 NOTE — Telephone Encounter (Signed)
Spoke to pt and informed her that her Ozempic was approved

## 2023-07-22 NOTE — Telephone Encounter (Signed)
 Noted

## 2023-07-22 NOTE — Telephone Encounter (Signed)
Spoke to pt and gave her the Customer service number for Dexcom [phone]  to reach out to see if they are able to help

## 2023-07-23 ENCOUNTER — Ambulatory Visit: Payer: Medicaid Other

## 2023-07-24 ENCOUNTER — Encounter: Payer: Self-pay | Admitting: Family

## 2023-07-24 ENCOUNTER — Ambulatory Visit: Payer: Self-pay | Admitting: Family

## 2023-07-24 NOTE — Telephone Encounter (Signed)
  Copied from CRM [id number]. Topic: Clinical - Prescription Issue >> Jul 24, 2023 11:44 AM Joanell NOVAK wrote: Reason for CRM: Pt stated that they sent in a new prescription for Ozempic  and she is wanting a callback to follow up when the prescription may be ready for her. She stated that she has already contacted he pharmacy, and they normally call her to let her know her prescription is ready. This encounter was created in error - please disregard.

## 2023-07-24 NOTE — Telephone Encounter (Signed)
 Spoke to [place] at [facility] he stated that pt can pick up tomorrow and her copay would be $4. Pt has been notified as well

## 2023-07-24 NOTE — Telephone Encounter (Signed)
 This RN called patient regarding prescription Ozempic . Patient requesting time when med would be ready for pick up, this RN advised patient that the pharmacy had not filled med due to needing authorization, however, I advised patient that prior authorization was given and approved on 07/21/23. This RN advised patient that note will be sent to office regarding request so that the med can be filled at pharmacy for pick up. Patient advised by this RN to call back with further concerns. Patient verbalized understanding.  Copied from CRM [id number]. Topic: Clinical - Prescription Issue >> Jul 24, 2023 11:44 AM Chelsea Barton wrote: Reason for CRM: Pt stated that they sent in a new prescription for Ozempic  and she is wanting a callback to follow up when the prescription may be ready for her. She stated that she has already contacted he pharmacy, and they normally call her to let her know her prescription is ready. Reason for Disposition  General information question, no triage required and triager able to answer question  Answer Assessment - Initial Assessment Questions 1. REASON FOR CALL or QUESTION: What is your reason for calling today? or How can I best help you? or What question do you have that I can help answer?     I just wanted to know when my Ozemic would be ready.  Protocols used: Information Only Call - No Triage-A-AH

## 2023-07-26 ENCOUNTER — Other Ambulatory Visit: Payer: Self-pay | Admitting: Internal Medicine

## 2023-07-26 DIAGNOSIS — E1039 Type 1 diabetes mellitus with other diabetic ophthalmic complication: Secondary | ICD-10-CM

## 2023-07-28 NOTE — Telephone Encounter (Unsigned)
 Copied from CRM [id number]. Topic: Clinical - Prescription Issue >> Jul 28, 2023 10:27 AM Margarette Shawl wrote: Reason for CRM: Patient was advised by pharmacy her Ozempic  could not be refilled. Verified correct pharmacy and advised patient she had 2 refills remaining. Patient wants to speak with provider's nurse about the issue. Requested CB# [phone]

## 2023-07-29 ENCOUNTER — Telehealth: Payer: Self-pay

## 2023-07-29 NOTE — Telephone Encounter (Signed)
Spoke to [place] at [employer] and he stated that they do not keep Ozempic in pharmacy they have to order when order is placed he stated that he was ordering Ozempic again and it should be there tomorrow on 07/29/23  with a $4 copay but he would not be there but he would leave a note for Pharmacist on duty to get Ozempic for pt . Called pt back and explained to her as well will call tomorrow am to make sure it is there to pickup

## 2023-07-29 NOTE — Telephone Encounter (Signed)
Copied from CRM [id number]. Topic: Clinical - Prescription Issue >> Jul 29, 2023  8:31 AM Efraim Kaufmann C wrote: Reason for CRM: patient called stating she still has not received her Ozempic and wanted to speak with the nurse as this had required prior authorization and she stated it has been 2 weeks. Please advise with patient. Thank you.

## 2023-07-30 ENCOUNTER — Ambulatory Visit: Payer: Medicaid Other | Admitting: Podiatry

## 2023-07-30 ENCOUNTER — Encounter: Payer: Self-pay | Admitting: Podiatry

## 2023-07-30 DIAGNOSIS — B351 Tinea unguium: Secondary | ICD-10-CM | POA: Diagnosis not present

## 2023-07-30 DIAGNOSIS — M79674 Pain in right toe(s): Secondary | ICD-10-CM | POA: Diagnosis not present

## 2023-07-30 DIAGNOSIS — L97522 Non-pressure chronic ulcer of other part of left foot with fat layer exposed: Secondary | ICD-10-CM | POA: Diagnosis not present

## 2023-07-30 NOTE — Progress Notes (Signed)
  Subjective:  Patient ID: Chelsea Barton, female    DOB: 1961/04/06,  MRN: [id number]  Chief Complaint  Patient presents with   Wound Check    "It's doing pretty good."   Nail Problem    "I want him to trim my toenails."      63 y.o. female returns for post-op check.  She returns for follow-up and ongoing wound care, her nails on her right foot are thickened elongated and painful again  Review of Systems: Negative except as noted in the HPI. Denies N/V/F/Ch.   Objective:   There were no vitals filed for this visit.  There is no height or weight on file to calculate BMI. Constitutional Well developed. Well nourished.  Vascular Foot warm and well perfused. Capillary refill normal to all digits.  Calf is soft and supple, no posterior calf or knee pain, negative Homans' sign  Neurologic Normal speech. Oriented to person, place, and time. Epicritic sensation to light touch grossly absent bilaterally.  Dermatologic Central residual ulceration postdebridement measures 3.0 x 10.0 cm, no exposed bone, exposed subcutaneous tissue.  No infection, significant hyperkeratosis surrounding this.  Nails thickened elongated x 5 on the right with subungual debris and significant dystrophy and mycosis  Orthopedic: She has no tenderness to palpation noted about the surgical site.     Assessment:   1. 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 Pain due to onychomycosis of toenail of right foot     Plan:  Patient was evaluated and treated and all questions answered.  S/p foot surgery left -Wound was debrided of all nonviable tissue to the subcutaneous layer in an excisional manner full-thickness with a sharp scalpel.  Hyperkeratosis and nonviable tissue was removed.  Postdebridement measurements noted above.  Dimensions slightly improved.  I will see her back in 4 weeks to reevaluate and for further treatment.  She should continue daily dressing changes with the ointment.  Expect this  wound will be chronic and progression slow, she is not ready or willing to undergo further amputation at this point  Discussed the etiology and treatment options for the condition in detail with the patient. Recommended debridement of the nails today. Sharp and mechanical debridement performed of all painful and mycotic nails today. Nails debrided in length and thickness using a nail nipper to level of comfort. Discussed treatment options including appropriate shoe gear. Follow up as needed for painful nails.     Return in about 1 month (around 08/27/2023) for wound care.

## 2023-07-31 ENCOUNTER — Encounter: Payer: Self-pay | Admitting: Nurse Practitioner

## 2023-07-31 ENCOUNTER — Ambulatory Visit: Payer: Medicaid Other | Attending: Nurse Practitioner | Admitting: Nurse Practitioner

## 2023-07-31 VITALS — BP 180/96 | HR 68 | Ht 65.0 in | Wt 210.1 lb

## 2023-07-31 DIAGNOSIS — I340 Nonrheumatic mitral (valve) insufficiency: Secondary | ICD-10-CM

## 2023-07-31 DIAGNOSIS — E10621 Type 1 diabetes mellitus with foot ulcer: Secondary | ICD-10-CM

## 2023-07-31 DIAGNOSIS — I739 Peripheral vascular disease, unspecified: Secondary | ICD-10-CM | POA: Diagnosis not present

## 2023-07-31 DIAGNOSIS — E782 Mixed hyperlipidemia: Secondary | ICD-10-CM

## 2023-07-31 DIAGNOSIS — L97509 Non-pressure chronic ulcer of other part of unspecified foot with unspecified severity: Secondary | ICD-10-CM | POA: Diagnosis not present

## 2023-07-31 DIAGNOSIS — I10 Essential (primary) hypertension: Secondary | ICD-10-CM

## 2023-07-31 DIAGNOSIS — N184 Chronic kidney disease, stage 4 (severe): Secondary | ICD-10-CM | POA: Diagnosis not present

## 2023-07-31 NOTE — Patient Instructions (Signed)
Medication Instructions:  No changes *If you need a refill on your cardiac medications before your next appointment, please call your pharmacy*   Lab Work: None ordered If you have labs (blood work) drawn today and your tests are completely normal, you will receive your results only by: MyChart Message (if you have MyChart) OR A paper copy in the mail If you have any lab test that is abnormal or we need to change your treatment, we will call you to review the results.   Testing/Procedures: Your physician has requested that you have an echocardiogram. Echocardiography is a painless test that uses sound waves to create images of your heart. It provides your doctor with information about the size and shape of your heart and how well your heart's chambers and valves are working.   You may receive an ultrasound enhancing agent through an IV if needed to better visualize your heart during the echo. This procedure takes approximately one hour.  There are no restrictions for this procedure.  This will take place at [address] [facility] [address], [place] [address]  Please note: We ask at that you not bring children with you during ultrasound (echo/ vascular) testing. Due to room size and safety concerns, children are not allowed in the ultrasound rooms during exams. Our front office staff cannot provide observation of children in our lobby area while testing is being conducted. An adult accompanying a patient to their appointment will only be allowed in the ultrasound room at the discretion of the ultrasound technician under special circumstances. We apologize for any inconvenience.    Follow-Up: At [facility], you and your health needs are our priority.  As part of our continuing mission to provide you with exceptional heart care, we have created designated Provider Care Teams.  These Care Teams include your primary Cardiologist (physician) and Advanced Practice  Providers (APPs -  Physician Assistants and Nurse Practitioners) who all work together to provide you with the care you need, when you need it.  We recommend signing up for the patient portal called "MyChart".  Sign up information is provided on this After Visit Summary.  MyChart is used to connect with patients for Virtual Visits (Telemedicine).  Patients are able to view lab/test results, encounter notes, upcoming appointments, etc.  Non-urgent messages can be sent to your provider as well.   To learn more about what you can do with MyChart, go to [email or web address].    Your next appointment:   12 month(s)  Provider:   You may see Debbe Odea, MD or one of the following Advanced Practice Providers on your designated Care Team:   Nicolasa Ducking, NP Eula Listen, PA-C Cadence Fransico Michael, PA-C Charlsie Quest, NP Carlos Levering, NP

## 2023-07-31 NOTE — Progress Notes (Signed)
Office Visit    Patient Name: Chelsea Barton Date of Encounter: 07/31/2023  Primary Care Provider:  Allegra Grana, FNP Primary Cardiologist:  Debbe Odea, MD  Chief Complaint    63 y.o. female with a history of hypertension, hyperlipidemia, diabetes, peripheral arterial disease, stage IV chronic kidney disease, mitral regurgitation, diastolic dysfunction, prior tobacco abuse, and COPD, who presents for follow-up related to HTN and MR.  Past Medical History  Subjective   Past Medical History:  Diagnosis Date   Aortic atherosclerosis (HCC)    CKD (chronic kidney disease), stage IV (HCC)    not on dialysis   COPD (chronic obstructive pulmonary disease) (HCC)    Coronary artery disease    Esophageal thickening    Hyperlipidemia    Hypertension    Impaired vision    LVH (left ventricular hypertrophy) due to hypertensive disease, without heart failure    Mitral regurgitation    a.07/2021 Echo: EF 50-55%, mod LVH, mild LAE, mild-mod MR, AoV sclerosis, G1DD   Obesity    Osteomyelitis of left foot (HCC)    a.) s/p LEFT transmetatarsal amputation   PAD (peripheral artery disease) (HCC)    a.) s/p PTA and stenting of LEFT SFA 04/13/2021 --> 6 x 200 mm LifeStent with distal edge just beyond Hunter's canal extending proximally and a 6 x 80 mm LifeStent to the origin of the SFA (stents overlapped by approximately 10 mm); b.) LE vascular duplex 09/26/2022 --> 75-99% stenosis LEFT SFA; c. 01/2023 PTA: L SFA atherectomy and PTA of RCIA.   Proteinuria    Secondary hyperparathyroidism of renal origin (HCC)    Type 2 diabetes mellitus treated with insulin (HCC)    a.) uses Dexcom CGM and OmniPod insulin pump   Past Surgical History:  Procedure Laterality Date   AMPUTATION TOE  04/10/2021   Procedure: AMPUTATION 2nd TOE, left foot;  Surgeon: Felecia Shelling, DPM;  Location: [facility];  Service: Podiatry;;   CATARACT EXTRACTION     eye surgery r Right    lense placed   GRAFT  APPLICATION Left 12/26/2021   Procedure: GRAFT APPLICATION-left foot;  Surgeon: Edwin Cap, DPM;  Location: [facility];  Service: Podiatry;  Laterality: Left;   GRAFT APPLICATION Left 10/04/2022   Procedure: GRAFT APPLICATION;  Surgeon: Edwin Cap, DPM;  Location: [facility];  Service: Podiatry;  Laterality: Left;   I & D EXTREMITY Left 12/26/2021   Procedure: IRRIGATION AND DEBRIDEMENT EXTREMITY-left foot;  Surgeon: Edwin Cap, DPM;  Location: [facility];  Service: Podiatry;  Laterality: Left;   INCISION AND DRAINAGE Left 04/18/2021   Procedure: INCISION AND DRAINAGE;  Surgeon: Edwin Cap, DPM;  Location: [facility];  Service: Podiatry;  Laterality: Left;   INCISION AND DRAINAGE OF WOUND Left 04/10/2021   Procedure: IRRIGATION AND DEBRIDEMENT WOUND;  Surgeon: Felecia Shelling, DPM;  Location: [facility];  Service: Podiatry;  Laterality: Left;   LOWER EXTREMITY ANGIOGRAPHY Left 01/21/2023   Procedure: Lower Extremity Angiography;  Surgeon: Renford Dills, MD;  Location: [facility];  Service: Cardiovascular;  Laterality: Left;   PERIPHERAL VASCULAR BALLOON ANGIOPLASTY Left 04/13/2021   Procedure: PERIPHERAL VASCULAR BALLOON ANGIOPLASTY;  Surgeon: Renford Dills, MD;  Location: [facility];  Service: Cardiovascular;  Laterality: Left;   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   Procedure: TRANSMETATARSAL AMPUTATION;  Surgeon: Candelaria Stagers, DPM;  Location: [facility];  Service: Podiatry;  Laterality: Left;   WOUND DEBRIDEMENT Left 10/04/2022  Procedure: DEBRIDEMENT WOUND;  Surgeon: Edwin Cap, DPM;  Location: [facility];  Service: Podiatry;  Laterality: Left;    Allergies  No Known Allergies    History of Present Illness      63 y.o. y/o female with a history of hypertension, hyperlipidemia, diabetes, peripheral arterial disease, stage IV chronic kidney disease, mitral regurgitation, diastolic dysfunction, prior tobacco abuse, and COPD.  She was  previously followed at [facility] cardiology with an echo in 2014 showing an EF of 45% with moderate mitral regurgitation.  In the setting of left foot gangrene, she underwent PTA and stenting of the left superficial femoral artery in October 2022.  Unfortunately, she subsequently required transmetatarsal amputation.  She established care with Dr. Azucena Cecil in January 2023.  Echo in February 2023 showed an EF of 50-55% with grade 1 diastolic dysfunction, and mild to moderate MR.  In August 2024, she underwent left SFA atherectomy and PTA of the right common iliac artery.    Ms. Steinmiller was last seen in cardiology clinic in April 2014, prior to her lower extremity arterial intervention in August 2024.  Since then, she notes that she has done reasonably well.  She has chronic right thigh discomfort with exertion though notes that it is better since her atherectomy last year.  She says she walks quite a bit at home and does not typically experience claudication symptoms to the extent that she needs to stop walking.  She has no trouble with walking through a supermarket and using a buggy for support.  She denies chest pain, dyspnea, palpitations, PND, orthopnea, dizziness, syncope, or early satiety.  She does have chronic, mild lower extremity swelling.  Her blood pressure is elevated today however, she has yet to take any of her morning medicines. Objective  Home Medications    Current Outpatient Medications  Medication Sig Dispense Refill   aspirin 81 MG EC tablet Take 1 tablet (81 mg total) by mouth daily. Swallow whole. 30 tablet 12   carvedilol (COREG) 12.5 MG tablet TAKE 1 TABLET(12.5 MG TOTAL) BY MOUTH 2 TIMES DAILY WITH A MEAL 180 tablet 1   dapagliflozin propanediol (FARXIGA) 10 MG TABS tablet TAKE 1 TABLET(10 MG) BY MOUTH DAILY 90 tablet 3   enalapril (VASOTEC) 20 MG tablet Take 20 mg by mouth 2 (two) times daily.     hydrALAZINE (APRESOLINE) 10 MG tablet TAKE 1/2 TABLET BY MOUTH THREE TIMES DAILY  AS NEEDED IF BLOOD PRESSURE IS OVER 140/90 30 tablet 1   insulin glargine (LANTUS SOLOSTAR) 100 UNIT/ML Solostar Pen Inject 40 Units into the skin at bedtime. 30 mL 1   Multiple Vitamin (MULTIVITAMIN) capsule Take 1 capsule by mouth daily.     NIFEdipine (PROCARDIA-XL/NIFEDICAL-XL) 30 MG 24 hr tablet TAKE 1 TABLET(30 MG) BY MOUTH DAILY (Patient taking differently: 30 mg every morning.) 30 tablet 12   NOVOLOG FLEXPEN 100 UNIT/ML FlexPen 5 breakfast- 10 lunch- 12 at night    40 UNITS DAILY MAX 15 mL 5   rosuvastatin (CRESTOR) 10 MG tablet Take 1 tablet (10 mg total) by mouth daily. 90 tablet 3   Vitamin D, Cholecalciferol, 10 MCG (400 UNIT) CAPS Take 1 capsule by mouth daily.     Accu-Chek Softclix Lancets lancets Use as directed (Patient not taking: Reported on 07/31/2023) 100 each 2   Blood Glucose Monitoring Suppl DEVI 1 each by Does not apply route in the morning, at noon, and at bedtime. May substitute to any manufacturer covered by patient's insurance. (Patient not  taking: Reported on 07/31/2023) 1 each 0   Continuous Blood Gluc Transmit (DEXCOM G6 TRANSMITTER) MISC Inject 1 Device into the skin as directed. Every 10 days (Patient not taking: Reported on 07/31/2023) 1 each 3   Continuous Glucose Receiver (DEXCOM G6 RECEIVER) DEVI Use as directed (Patient not taking: Reported on 07/31/2023) 1 each 0   Continuous Glucose Sensor (DEXCOM G6 SENSOR) MISC PLACE A NEW SENSOR EVERY 10 DAYS. USE TO CHECK BLOOD SUGAR AT LEAST 4 TIMES DAILY (Patient not taking: Reported on 07/31/2023) 9 each 1   glucose blood (ACCU-CHEK AVIVA PLUS) test strip Used to check blood sugar 4x daily. (Patient not taking: Reported on 07/31/2023) 200 each 12   Insulin Pen Needle (EASY TOUCH PEN NEEDLES) 31G X 5 MM MISC Check sugar 3-4 daily (Patient not taking: Reported on 07/31/2023) 100 each 2   Insulin Pen Needle 31G X 5 MM MISC 1 Device by Does not apply route in the morning, at noon, in the evening, and at bedtime. (Patient not  taking: Reported on 07/31/2023) 400 each 2   Semaglutide,0.25 or 0.5MG /DOS, 2 MG/3ML SOPN Inject 0.25 mg into the skin once a week. (Patient not taking: Reported on 07/31/2023) 3 mL 2   traMADol (ULTRAM) 50 MG tablet Take 1 tablet (50 mg total) by mouth every 6 (six) hours as needed. (Patient not taking: Reported on 07/31/2023) 20 tablet 0   No current facility-administered medications for this visit.     Physical Exam    VS:  BP (!) 180/96   Pulse 68   Ht 5\' 5"  (1.651 m)   Wt 210 lb 2 oz (95.3 kg)   SpO2 97%   BMI 34.97 kg/m  , BMI Body mass index is 34.97 kg/m.     Vitals:   07/31/23 0859 07/31/23 0949  BP: (!) 168/80 (!) 180/96  Pulse: 68   SpO2: 97%       GEN: Well nourished, well developed, in no acute distress. HEENT: normal. Neck: Supple, no JVD, carotid bruits, or masses. Cardiac: RRR, no murmurs, rubs, or gallops. No clubbing, cyanosis, trace bilat LE woody edema.  Radials 2+/PT 2+ and equal bilaterally.  Respiratory:  Respirations regular and unlabored, clear to auscultation bilaterally. GI: Soft, nontender, nondistended, BS + x 4. MS: no deformity or atrophy. Skin: warm and dry, no rash. Neuro:  Strength and sensation are intact. Psych: Normal affect.  Accessory Clinical Findings    ECG personally reviewed by me today - EKG Interpretation Date/Time:  Thursday July 31 2023 09:09:40 EST Ventricular Rate:  70 PR Interval:  158 QRS Duration:  94 QT Interval:  426 QTC Calculation: 460 R Axis:   -33  Text Interpretation: Normal sinus rhythm Left axis deviation Minimal voltage criteria for LVH, may be normal variant ( Cornell product ) Inferior infarct , age undetermined Confirmed by Nicolasa Ducking [phone]) on 07/31/2023 9:23:16 AM  - no acute changes.  Lab Results  Component Value Date   WBC 8.7 09/24/2022   HGB 13.5 09/24/2022   HCT 43.0 09/24/2022   MCV 84.1 09/24/2022   PLT 214.0 09/24/2022   Lab Results  Component Value Date   CREATININE 2.31 (H)  01/21/2023   BUN 41 (H) 01/21/2023   NA 141 09/24/2022   K 4.9 09/24/2022   CL 102 09/24/2022   CO2 30 09/24/2022   Lab Results  Component Value Date   ALT 12 09/24/2022   AST 16 09/24/2022   ALKPHOS 75 09/24/2022   BILITOT 0.5  09/24/2022   Lab Results  Component Value Date   CHOL 131 05/29/2022   HDL 52.70 05/29/2022   LDLCALC 64 05/29/2022   LDLDIRECT 84.9 09/10/2012   TRIG 72.0 05/29/2022   CHOLHDL 2 05/29/2022    Lab Results  Component Value Date   HGBA1C 10.3 (A) 07/16/2023   Lab Results  Component Value Date   TSH 1.46 07/11/2021       Assessment & Plan    1.  Mitral regurgitation: Patient with history of mitral regurgitation dating back to 2014.  Most recent echo in February 2023 showed low normal EF at 50-55% with mild to moderate MR.  She has been doing well without chest pain or dyspnea.  I will arrange for a follow-up echo as it has been 2 years since her last one.  2.  Primary hypertension: Blood pressure elevated today however, patient has yet to take her morning medications.  I note that her blood pressure was normal at recent primary care visit.  She typically runs in the 120s to 140 range at home per her report.  I encouraged her to go home and take her blood pressure medicine and continue to follow pressures at home with low threshold to notify us for pressures consistently over 140 mmHg.  Continue carvedilol, enalapril, nifedipine.  3.  Peripheral arterial disease: Status post prior lower extremity interventions, most recently in August 2024.  She has chronic left greater than right thigh claudication though this does not typically limit activities and overall is better since prior to her August 2024 intervention.  She remains on aspirin and statin therapy.  She is followed by vascular surgery.  4.  Hyperlipidemia: LDL was at goal in late 2023.  Continue rosuvastatin.  5.  Diabetes mellitus: A1c was 10.3 in January.  Recently prescribed Ozempic though  still waiting for her pharmacy to receive stock.  Followed by primary care.  6.  Stage IV chronic kidney disease: Creatinine stable at 2.31 in August 2024.  She remains on ACE inhibitor and dapagliflozin therapy.  Disposition: Follow-up 2D echocardiogram.    7.  Follow-up in 1 year or sooner provided that echo was stable.  Nicolasa Ducking, NP 07/31/2023, 9:49 AM

## 2023-08-01 NOTE — Telephone Encounter (Signed)
Spoke to pt  and she finally received her Ozempic from the pharmacy . I explained to her that if she has any questions please feel free to call our office

## 2023-08-03 ENCOUNTER — Other Ambulatory Visit: Payer: Self-pay | Admitting: Family

## 2023-08-22 ENCOUNTER — Ambulatory Visit: Payer: Medicaid Other | Attending: Nurse Practitioner

## 2023-08-22 DIAGNOSIS — I340 Nonrheumatic mitral (valve) insufficiency: Secondary | ICD-10-CM | POA: Diagnosis not present

## 2023-08-22 LAB — ECHOCARDIOGRAM COMPLETE
AR max vel: 4.06 cm2
AV Area VTI: 4.09 cm2
AV Area mean vel: 4.01 cm2
AV Mean grad: 4 mmHg
AV Peak grad: 6.5 mmHg
Ao pk vel: 1.27 m/s
Area-P 1/2: 4.15 cm2
Calc EF: 53.2 %
MV VTI: 4.4 cm2
S' Lateral: 4.2 cm
Single Plane A2C EF: 54.4 %
Single Plane A4C EF: 52.5 %

## 2023-08-25 ENCOUNTER — Other Ambulatory Visit: Payer: Self-pay | Admitting: Internal Medicine

## 2023-08-25 DIAGNOSIS — E1039 Type 1 diabetes mellitus with other diabetic ophthalmic complication: Secondary | ICD-10-CM

## 2023-08-26 DIAGNOSIS — E10621 Type 1 diabetes mellitus with foot ulcer: Secondary | ICD-10-CM | POA: Diagnosis not present

## 2023-08-26 DIAGNOSIS — E1022 Type 1 diabetes mellitus with diabetic chronic kidney disease: Secondary | ICD-10-CM | POA: Diagnosis not present

## 2023-08-26 DIAGNOSIS — N184 Chronic kidney disease, stage 4 (severe): Secondary | ICD-10-CM | POA: Diagnosis not present

## 2023-08-26 DIAGNOSIS — I10 Essential (primary) hypertension: Secondary | ICD-10-CM | POA: Diagnosis not present

## 2023-08-26 DIAGNOSIS — E1042 Type 1 diabetes mellitus with diabetic polyneuropathy: Secondary | ICD-10-CM | POA: Diagnosis not present

## 2023-08-26 DIAGNOSIS — E10319 Type 1 diabetes mellitus with unspecified diabetic retinopathy without macular edema: Secondary | ICD-10-CM | POA: Diagnosis not present

## 2023-08-26 DIAGNOSIS — L97529 Non-pressure chronic ulcer of other part of left foot with unspecified severity: Secondary | ICD-10-CM | POA: Diagnosis not present

## 2023-08-26 DIAGNOSIS — E1059 Type 1 diabetes mellitus with other circulatory complications: Secondary | ICD-10-CM | POA: Diagnosis not present

## 2023-08-26 DIAGNOSIS — E1065 Type 1 diabetes mellitus with hyperglycemia: Secondary | ICD-10-CM | POA: Diagnosis not present

## 2023-08-27 ENCOUNTER — Ambulatory Visit: Payer: Medicaid Other | Admitting: Podiatry

## 2023-08-27 ENCOUNTER — Encounter: Payer: Self-pay | Admitting: Podiatry

## 2023-08-27 DIAGNOSIS — L97522 Non-pressure chronic ulcer of other part of left foot with fat layer exposed: Secondary | ICD-10-CM | POA: Diagnosis not present

## 2023-08-27 NOTE — Progress Notes (Signed)
  Subjective:  Patient ID: Chelsea Barton, female    DOB: [birth year],  MRN: [id number]  Chief Complaint  Patient presents with   Diabetic Ulcer    "It's doing good."      63 y.o. female returns for post-op check.  She returns for follow-up and ongoing wound care has not noticed much change or drainage  Review of Systems: Negative except as noted in the HPI. Denies N/V/F/Ch.   Objective:   There were no vitals filed for this visit.  There is no height or weight on file to calculate BMI. Constitutional Well developed. Well nourished.  Vascular Foot warm and well perfused. Capillary refill normal to all digits.  Calf is soft and supple, no posterior calf or knee pain, negative Homans' sign  Neurologic Normal speech. Oriented to person, place, and time. Epicritic sensation to light touch grossly absent bilaterally.  Dermatologic Central residual ulceration postdebridement measures 3.0 x 2.5 x 0.8 cm, no exposed bone, exposed subcutaneous tissue.  No infection, significant hyperkeratosis surrounding this.  Nails thickened elongated x 5 on the right with subungual debris and significant dystrophy and mycosis  Orthopedic: She has no tenderness to palpation noted about the surgical site.     Assessment:   1. 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  S/p foot surgery left -Wound was debrided of all nonviable tissue to the subcutaneous layer in an excisional manner full-thickness with a sharp scalpel.  Hyperkeratosis and nonviable tissue was removed.  Postdebridement measurements noted above.  Dimensions still unchanged.  She is unwilling to go under further amputation.  Considering her lack of nonhealing I recommended skin subcu defecation and debridement in the operating room.  We discussed the risk and benefits of this.  Informed consent signed reviewed.  Surgery be scheduled for next week.   Surgical  plan:  Procedure: -Left foot debridement and skin substitute application of Kerecis  Location: -[facility]  Anesthesia plan: -Sedation with local  Postoperative pain plan: - Tylenol 1000 mg every 6 hours  DVT prophylaxis: -None required  WB Restrictions / DME needs: -WBAT in surgical shoe or cam boot     Return in about 1 month (around 09/27/2023) for wound care.

## 2023-08-29 ENCOUNTER — Telehealth: Payer: Self-pay | Admitting: Urology

## 2023-08-29 NOTE — Telephone Encounter (Signed)
 DOS 09/05/23  GRAFT APPLICATION --- 15275 DEBRIDEMENT --- [id number]  UHC MEDICAID EFFECTIVE DATE 05/17/2021  PER UHC WEBSITE FOR CPT CODES 54098 AND [phone] Notification or Prior Authorization is not required for the requested services This [employer] Medicaid members plan does not currently require a prior authorization for these services. If you have general questions about the prior authorization requirements, please call us at [phone] or visit [email or web address] and select the state where you practice. The number above acknowledges your notification. Please write this number down for future reference. Notification or Prior Authorization does not confirm benefit coverage and is not a guarantee of coverage or payment.   Decision ID #: [id number]

## 2023-09-02 ENCOUNTER — Encounter
Admission: RE | Admit: 2023-09-02 | Discharge: 2023-09-02 | Disposition: A | Discharge status: Home / Self Care | Source: Ambulatory Visit | Attending: Podiatry | Admitting: Podiatry

## 2023-09-02 ENCOUNTER — Other Ambulatory Visit: Payer: Self-pay

## 2023-09-02 ENCOUNTER — Telehealth: Payer: Self-pay

## 2023-09-02 VITALS — Ht 65.0 in | Wt 200.0 lb

## 2023-09-02 DIAGNOSIS — E1122 Type 2 diabetes mellitus with diabetic chronic kidney disease: Secondary | ICD-10-CM

## 2023-09-02 NOTE — Patient Instructions (Addendum)
 Your procedure is scheduled on: Friday 09/05/23 To find out your arrival time, please call [phone] between 1PM - 3PM on:   Thursday 09/04/23 Report to the Registration Desk on the 1st floor of the [facility]. Free Valet parking is available.  If your arrival time is 6:00 am, do not arrive before that time as the [facility] entrance doors do not open until 6:00 am.  REMEMBER: Instructions that are not followed completely may result in serious medical risk, up to and including death; or upon the discretion of your surgeon and anesthesiologist your surgery may need to be rescheduled.  Do not eat food after midnight the night before surgery.  No gum chewing or hard candies.  You may however, drink CLEAR liquids up to 2 hours before you are scheduled to arrive for your surgery. Do not drink anything within 2 hours of your scheduled arrival time.  Clear liquids include: - water   Type 1 and Type 2 diabetics should only drink water. You can call the Day Surgery Dept at [phone] if you develop low blood sugars Friday before your arrival.  One week prior to surgery: Stop Anti-inflammatories (NSAIDS) such as Advil, Aleve, Ibuprofen, Motrin, Naproxen, Naprosyn and Aspirin based products such as Excedrin, Goody's Powder, BC Powder. You may however, continue to take Tylenol if needed for pain up until the day of surgery.  Stop ANY OVER THE COUNTER supplements and vitamins until after surgery.  Continue taking all prescribed medications.   **Follow guidelines for insulin and diabetes medications** Decrease your Lantus to 20 units on Thursday night and NO Insulin Friday  Follow recommendations from Cardiologist or PCP regarding stopping blood thinners.  TAKE ONLY THESE MEDICATIONS THE MORNING OF SURGERY WITH A SIP OF WATER:  carvedilol (COREG) 12.5 MG tablet  NIFEdipine (PROCARDIA-XL/NIFEDICAL-XL) 30 MG 24 hr tablet   No Alcohol for 24 hours before or after surgery.  No Smoking  including e-cigarettes for 24 hours before surgery.  No chewable tobacco products for at least 6 hours before surgery.  No nicotine patches on the day of surgery.  Do not use any "recreational" drugs for at least a week (preferably 2 weeks) before your surgery.  Please be advised that the combination of cocaine and anesthesia may have negative outcomes, up to and including death. If you test positive for cocaine, your surgery will be cancelled.  You can pick up CHG wipes at our office in the [facility] at [address]  On the morning of surgery brush your teeth with toothpaste and water, you may rinse your mouth with mouthwash if you wish. Do not swallow any toothpaste or mouthwash.  Do not wear lotions, powders, or perfumes.   Do not shave body hair from the neck down 48 hours before surgery.  Wear comfortable clothing (specific to your surgery type) to the hospital.  Do not wear jewelry, make-up, hairpins, clips or nail polish.  For welded (permanent) jewelry: bracelets, anklets, waist bands, etc.  Please have this removed prior to surgery.  If it is not removed, there is a chance that hospital personnel will need to cut it off on the day of surgery. Contact lenses, hearing aids and dentures may not be worn into surgery.  Do not bring valuables to the hospital. [facility] is not responsible for any missing/lost belongings or valuables.   Notify your doctor if there is any change in your medical condition (cold, fever, infection).  If you are being  discharged the day of surgery, you will not be allowed to drive home. You will need a responsible individual to drive you home and stay with you for 24 hours after surgery.   If you are taking public transportation, you will need to have a responsible individual with you.  If you are being admitted to the hospital overnight, leave your suitcase in the car. After surgery it may be brought to your room.  In case of  increased patient census, it may be necessary for you, the patient, to continue your postoperative care in the Same Day Surgery department.  After surgery, you can help prevent lung complications by doing breathing exercises.  Take deep breaths and cough every 1-2 hours. Your doctor may order a device called an Incentive Spirometer to help you take deep breaths. When coughing or sneezing, hold a pillow firmly against your incision with both hands. This is called "splinting." Doing this helps protect your incision. It also decreases belly discomfort.  Surgery Visitation Policy:  Patients undergoing a surgery or procedure may have two family members or support persons with them as long as the person is not COVID-19 positive or experiencing its symptoms.   Inpatient Visitation:    Visiting hours are 7 a.m. to 8 p.m. Up to four visitors are allowed at one time in a patient room. The visitors may rotate out with other people during the day. One designated support person (adult) may remain overnight.  Due to an increase in RSV and influenza rates and associated hospitalizations, children ages 41 and under will not be able to visit patients in [facility]. Masks continue to be strongly recommended.  Please call the Pre-admissions Testing Dept. at [phone] if you have any questions about these instructions.Preparing the Skin Before Surgery     To help prevent the risk of infection at your surgical site, we are now providing you with rinse-free Sage 2% Chlorhexidine Gluconate (CHG) disposable wipes.  Chlorhexidine Gluconate (CHG) Soap  o An antiseptic cleaner that kills germs and bonds with the skin to continue killing germs even after washing  o Used for showering the night before surgery and morning of surgery  The night before surgery: Shower or bathe with warm water. Do not apply perfume, lotions, powders. Wait one hour after shower. Skin should be dry and cool. Open  Sage wipe package - use 6 disposable cloths. Wipe body using one cloth for the right arm, one cloth for the left arm, one cloth for the right leg, one cloth for the left leg, one cloth for the chest/abdomen area, and one cloth for the back. Do not use on open wounds or sores. Do not use on face or genitals (private parts). If you are breast feeding, do not use on breasts. 5. Do not rinse, allow to dry. 6. Skin may feel "tacky" for several minutes. 7. Dress in clean clothes. 8. Place clean sheets on your bed and do not sleep with pets.  REPEAT ABOVE ON THE MORNING OF SURGERY BEFORE ARRIVING TO THE HOSPITAL.

## 2023-09-02 NOTE — Telephone Encounter (Signed)
 Spoke to pt scheduled her appt for 12/22/23

## 2023-09-02 NOTE — Telephone Encounter (Signed)
 Copied from CRM [id number]. Topic: Clinical - Medical Advice >> Sep 02, 2023 11:15 AM [name] wrote: Reason for CRM: Patient received a letter from her insurance [employer] telling her to make an appointment with her provider, patient wants to know if Rennie Plowman needs to see her for any reason?

## 2023-09-03 ENCOUNTER — Encounter: Payer: Self-pay | Admitting: Podiatry

## 2023-09-03 ENCOUNTER — Telehealth: Payer: Self-pay | Admitting: *Deleted

## 2023-09-03 NOTE — Telephone Encounter (Signed)
-----   Message from Verlee Monte sent at 09/02/2023  3:40 PM EDT ----- Regarding: Request for pre-operative cardiac clearance Request for pre-operative cardiac clearance:  1. What type of surgery is being performed?  APPLICATION, ALLOGRAFT, SKIN (Left); DEBRIDEMENT, WOUND (Left  2. When is this surgery scheduled?  09/05/2023  3. Type of clearance being requested (medical, pharmacy, both)? MEDICAL   4. Are there any medications that need to be held prior to surgery? ASA  5. Practice name and name of physician performing surgery?  Performing surgeon: Dr. Sharl Ma, DPM Requesting clearance: Quentin Mulling, FNP-C    6. Anesthesia type (none, local, MAC, general)? MAC  7. What is the office phone and fax number?   Fax: [phone]  ATTENTION: Unable to create telephone message as per your standard workflow. Directed by [facility] providers to send requests for cardiac clearance to this pool for appropriate distribution to provider covering pre-operative clearances.   Quentin Mulling, MSN, APRN, FNP-C, CEN [facility]  Peri-operative Services Nurse Practitioner Phone: [phone] 09/02/23 3:40 PM

## 2023-09-03 NOTE — Progress Notes (Signed)
 Perioperative / Anesthesia Services  Pre-Admission Testing Clinical Review / Preoperative Anesthesia Consult  Date: 09/03/23  Patient Demographics:  Name: Chelsea Barton DOB:   [birth year] MRN:   [id number]  Planned Surgical Procedure(s):    Case: [id number] Date/Time: 09/05/23 1515   Procedures:      APPLICATION, ALLOGRAFT, SKIN (Left)     DEBRIDEMENT, WOUND (Left)   Anesthesia type: Monitor Anesthesia Care   Diagnosis: Ulcer of left foot with fat layer exposed (HCC) [L97.522]   Pre-op diagnosis: LEFT FOOT ULCER   Location: [facility] OR ROOM 02 / [facility]   Surgeons: Edwin Cap, DPM     NOTE: Available PAT nursing documentation and vital signs have been reviewed. Clinical nursing staff has updated patient's PMH/PSHx, current medication list, and drug allergies/intolerances to ensure comprehensive history available to assist in medical decision making as it pertains to the aforementioned surgical procedure and anticipated anesthetic course. Extensive review of available clinical information personally performed. Chelsea Barton PMH and PSHx updated with any diagnoses/procedures that  may have been inadvertently omitted during her intake with the pre-admission testing department's nursing staff.  Clinical Discussion:  Chelsea Barton is a 63 y.o. female who is submitted for pre-surgical anesthesia review and clearance prior to her undergoing the above procedure. Patient is a Former Smoker (25 pack years; quit 06/2005). 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  Patient is followed by cardiology Azucena Cecil, MD). She was last seen in the cardiology clinic on 07/31/2023; notes reviewed.  At the time of her clinic visit, patient  doing well overall from a cardiovascular perspective. She continued to experience chronic lower extremity swelling, however note this symptom to be stable and as baseline. Claudication pain had improved following most recent intervention with vascular surgery. Patient denied any chest pain, shortness of breath, PND, orthopnea, palpitations, weakness, fatigue, vertiginous symptoms, or presyncope/syncope. Patient with a past medical history significant for cardiovascular diagnoses. Documented physical exam was grossly benign, providing no evidence of acute exacerbation and/or decompensation of the patient's known cardiovascular conditions.  TTE was performed on 07/18/2021 revealing a low normal left ventricular systolic function with an EF of 50-55%.  There were no regional wall motion abnormalities.  Moderate LVH observed. Left ventricular diastolic Doppler parameters consistent with abnormal relaxation (G1DD).  Right ventricular size and function normal.  Left atrium mildly dilated.  Small pericardial effusion present.  There was mild to moderate mitral valve regurgitation.  Aortic valve sclerosis/calcification present on exam. All transvalvular gradients were noted to be normal providing no evidence suggestive of valvular stenosis.  Patient with a history of significant PAD.    ABI/TBI study performed on 09/26/2022 indicating mild BILATERAL lower extremity arterial disease with abnormal TBI's.  Subsequent lower extremity vascular duplex was performed on 09/26/2022 revealing a 75-99% stenosis of the LEFT SFA.   She underwent PTA and stenting of the LEFT SFA on 04/13/2021 placing a 6 x 200 mm LifeStent with the distal edge just beyond Hunter's canal extending proximally and a 6 x 80 mm LifeStent  to the origin of the SFA.  Stents overlapped by approximately 10 mm.  Patient went on further vascular intervention on 01/21/2023. Arthrectomy of the LEFT SFA was performed followed by PTA and stenting placing a 6  x 120 mm LifeStent with <10% residual stenosis and preservation of  distal runoff.   Blood pressure was documented  at 180/96 mmHg on currently prescribed beta-blocker (carvedilol), ACEi (enalapril), vasodilator (hydralazine) and CCB (nifedipine) therapies. Patient reported that she had not taken her medications prior to her visit that day. She added that SBPs run in the 130-140 mmHg range at home after taking her medications.  Patient is on rosuvastatin for her HLD diagnosis and ASCVD prevention. T2DM poorly controlled on currently prescribed regimen; last HgbA1c was 10.3% when checked on 07/16/2023. Patient recently had ben started on GLP-1 (semaglutide), however she had not started taking the medication yet.  In the setting of known cardiovascular disease and concurrent T2DM diagnosis, patient is on a SGLT2i (dapagliflozin) for added cardiovascular and renovascular protection. She does not have an OSAH diagnosis.  Functional capacity limited by patient's PAD and other multiple medical comorbidities. With that being said, patient continue to be able to complete her ADL/IALDs without cardiovascular limitation. Per the DASI, patient still felt to be able to achieve at least 4 METS of physical activity without experiencing any degree of significant angina/anginal equivalent symptoms.  Given her history of diastolic dysfunction and mitral valve regurgitation, the decision was made to update her echocardiogram. No changes were made to her medication regimen.  Patient follow-up with outpatient cardiology in 1 year or sooner if needed.  Since patient was last seen by cardiology, she has undergone the recommended non-invasive studies as ordered by her cardiology team.   Most recent TTE performed on 08/22/2023 revealed a normal left ventricular systolic function with an EF of 55-60%. There was mild LVH.  There were no regional wall motion abnormalities. Left ventricular diastolic Doppler parameters consistent with  pseudonormalization (G2DD). Left atrium was mildly dilated. Right ventricular size and function normal with a TAPSE measuring 2.5 cm  (normal range >/= 1.6 cm).  There was mild mitral valve regurgitation. All transvalvular gradients were noted to be normal providing no evidence suggestive of valvular stenosis. Aorta normal in size with no evidence of ectasia or aneurysmal dilatation.   Chelsea Barton is scheduled for an APPLICATION, ALLOGRAFT, SKIN (Left); DEBRIDEMENT, WOUND (Left)) on 03/21/20254 with Dr. Sharl Ma, DPM  Given patient's past medical history significant for cardiovascular diagnoses, presurgical cardiac clearance was sought by the PAT team. Per cardiology, "***  In review of her medication reconciliation, it is noted that patient is currently on prescribed daily antithrombotic therapy. Given that patient's past medical history is significant for cardiovascular and PAD diagnoses, podiatry has cleared patient to continue her daily low dose ASA throughout her perioperative course. She will be asked to hold her normal dose on the day of her procedure only. Patient has been updated on these directives from her specialty care providers by the PAT team.   Patient denies previous perioperative complications with anesthesia in the past. In review of the available records, it is noted that patient underwent a MAC anesthetic course here at [facility] in 12/2021 without documented complications.      09/02/2023    2:34 PM 07/31/2023    9:49 AM 07/31/2023    8:59 AM  Vitals with BMI  Height 5\' 5"   5\' 5"   Weight 200 lbs  210 lbs 2 oz  BMI 33.28  34.97  Systolic  180 168  Diastolic  96 80  Pulse   68    Providers/Specialists:   NOTE: Primary physician provider listed below. Patient may have been seen by APP or partner within same practice.  PROVIDER ROLE / SPECIALTY LAST OV  Edwin Cap, DPM Podiatry (Surgeon) 08/27/2023  Allegra Grana, FNP Primary Care Provider 07/16/2023  Debbe Odea, MD Cardiology 07/31/2023  Levora Dredge, MD Vascular Surgery 09/26/2022  Wendall Mola Endocrinology 08/26/2023   Allergies:  Patient has no known allergies.  Current Home Medications:   No current facility-administered medications for this encounter.    Accu-Chek Softclix Lancets lancets   aspirin 81 MG EC tablet   Blood Glucose Monitoring Suppl DEVI   carvedilol (COREG) 12.5 MG tablet   Continuous Glucose Receiver (DEXCOM G6 RECEIVER) DEVI   Continuous Glucose Sensor (DEXCOM G6 SENSOR) MISC   Continuous Glucose Transmitter (DEXCOM G6 TRANSMITTER) MISC   dapagliflozin propanediol (FARXIGA) 10 MG TABS tablet   enalapril (VASOTEC) 20 MG tablet   glucose blood (ACCU-CHEK AVIVA PLUS) test strip   hydrALAZINE (APRESOLINE) 10 MG tablet   insulin glargine (LANTUS SOLOSTAR) 100 UNIT/ML Solostar Pen   Insulin Pen Needle (EASY TOUCH PEN NEEDLES) 31G X 5 MM MISC   Insulin Pen Needle 31G X 5 MM MISC   Multiple Vitamin (MULTIVITAMIN) capsule   NIFEdipine (PROCARDIA-XL/NIFEDICAL-XL) 30 MG 24 hr tablet   NOVOLOG FLEXPEN 100 UNIT/ML FlexPen   olmesartan (BENICAR) 40 MG tablet   rosuvastatin (CRESTOR) 10 MG tablet   Semaglutide,0.25 or 0.5MG /DOS, 2 MG/3ML SOPN   traMADol (ULTRAM) 50 MG tablet   Vitamin D, Cholecalciferol, 10 MCG (400 UNIT) CAPS   History:   Past Medical History:  Diagnosis Date   Aortic atherosclerosis (HCC)    CKD (chronic kidney disease), stage IV (HCC)    not on dialysis   COPD (chronic obstructive pulmonary disease) (HCC)    Coronary artery disease    Diastolic dysfunction    a.) TTE 07/18/2021: EF 50-55%, mod LVH, mild LAE, mild-mod MR, AoV sclerosis, G1DD; b.) TTE 08/22/2023: EF 55-60%, mild LVH, G2DD, mild LAE, RVSF norm, mid MR   Esophageal thickening    Hyperlipidemia    Hypertension    Impaired vision    Obesity    Osteomyelitis of left foot (HCC)    a.) s/p LEFT transmetatarsal  amputation   PAD (peripheral artery disease) (HCC)    a.) s/p PTA and stenting of LEFT SFA 04/13/2021 --> 6 x 200 mm LifeStent with distal edge just beyond Hunter's canal extending proximally and a 6 x 80 mm LifeStent to the origin of the SFA (stents overlapped by approximately 10 mm); b.) LE vascular duplex 09/26/2022 --> 75-99% stenosis LEFT SFA; c. 01/2023 PTA: L SFA atherectomy and PTA of RCIA.   Proteinuria    Secondary hyperparathyroidism of renal origin (HCC)    Type 2 diabetes mellitus treated with insulin (HCC)    a.) uses Dexcom CGM and OmniPod insulin pump   Past Surgical History:  Procedure Laterality Date   AMPUTATION TOE  04/10/2021   Procedure: AMPUTATION 2nd TOE, left foot;  Surgeon: Felecia Shelling, DPM;  Location: [facility] ORS;  Service: Podiatry;;   CATARACT EXTRACTION W/ INTRAOCULAR LENS IMPLANT Right    GRAFT APPLICATION Left 12/26/2021   Procedure: GRAFT APPLICATION-left foot;  Surgeon: Edwin Cap, DPM;  Location: [facility] ORS;  Service: Podiatry;  Laterality: Left;   GRAFT APPLICATION Left 10/04/2022   Procedure: GRAFT APPLICATION;  Surgeon: Edwin Cap, DPM;  Location: [facility] ORS;  Service: Podiatry;  Laterality: Left;   I & D EXTREMITY Left 12/26/2021   Procedure: IRRIGATION AND DEBRIDEMENT EXTREMITY-left foot;  Surgeon: Edwin Cap, DPM;  Location: [facility] ORS;  Service: Podiatry;  Laterality: Left;   INCISION AND DRAINAGE Left 04/18/2021   Procedure: INCISION AND DRAINAGE;  Surgeon: Edwin Cap, DPM;  Location: [facility] ORS;  Service: Podiatry;  Laterality: Left;   INCISION AND DRAINAGE OF WOUND Left 04/10/2021   Procedure: IRRIGATION AND DEBRIDEMENT WOUND;  Surgeon: Felecia Shelling, DPM;  Location: [facility] ORS;  Service: Podiatry;  Laterality: Left;   LOWER EXTREMITY ANGIOGRAPHY Left 01/21/2023   Procedure: Lower Extremity Angiography;  Surgeon: Renford Dills, MD;  Location: [facility]AS[facility];  Service: Cardiovascular;  Laterality: Left;   PERIPHERAL  VASCULAR BALLOON ANGIOPLASTY Left 04/13/2021   Procedure: PERIPHERAL VASCULAR BALLOON ANGIOPLASTY;  Surgeon: Renford Dills, MD;  Location: [facility];  Service: Cardiovascular;  Laterality: Left;   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   Procedure: TRANSMETATARSAL AMPUTATION;  Surgeon: Candelaria Stagers, DPM;  Location: [facility] ORS;  Service: Podiatry;  Laterality: Left;   WOUND DEBRIDEMENT Left 10/04/2022   Procedure: DEBRIDEMENT WOUND;  Surgeon: Edwin Cap, DPM;  Location: [facility] ORS;  Service: Podiatry;  Laterality: Left;   Family History  Problem Relation Age of Onset   Kidney disease Sister    Diabetes Sister        70/5 sisters have DM   Breast cancer Neg Hx    Colon cancer Neg Hx    Thyroid cancer Neg Hx    Social History   Tobacco Use   Smoking status: Former    Current packs/day: 0.00    Average packs/day: 1 pack/day for 25.0 years (25.0 ttl pk-yrs)    Types: Cigarettes    Start date: 06/18/1980    Quit date: 06/18/2005    Years since quitting: 18.2   Smokeless tobacco: Never  Vaping Use   Vaping status: Never Used  Substance Use Topics   Alcohol use: No   Drug use: Not Currently    Comment: 30 + yrs ago cocaine    Pertinent Clinical Results:  LABS:   Lab Results  Component Value Date   WBC 8.7 09/24/2022   HGB 13.5 09/24/2022   HCT 43.0 09/24/2022   MCV 84.1 09/24/2022   PLT 214.0 09/24/2022   Lab Results  Component Value Date   NA 141 09/24/2022   K 4.9 09/24/2022   CO2 30 09/24/2022   GLUCOSE 112 (H) 09/24/2022   BUN 41 (H) 01/21/2023   CREATININE 2.31 (H) 01/21/2023   CALCIUM 10.1 09/24/2022   GFRNONAA 23 (L) 01/21/2023   Lab Results  Component Value Date   HGBA1C 10.3 (A) 07/16/2023    ECG: Date: 07/31/2023 Time ECG obtained: 0909 AM Rate: 70 bpm Rhythm:  Normal sinus rhythm Axis (leads I and aVF): Left Intervals: PR 158 ms. QRS 94 ms. QTc 460 ms. ST segment and T wave changes: Evidence of possible age undetermined  inferior and anterior infarcts present.  Comparison: Similar to previous tracing obtained on 09/26/2022   IMAGING / PROCEDURES: TRANSTHORACIC ECHOCARDIOGRAM performed on 08/22/2023 Left ventricular ejection fraction, by estimation, is 55 to 60%. The left ventricle has normal function. The left ventricle has no regional wall motion abnormalities. There is mild left ventricular hypertrophy. Left ventricular diastolic parameters are consistent with Grade II diastolic dysfunction (pseudonormalization).  Right ventricular systolic function is normal. The right ventricular size is normal.  Left atrial size was mildly dilated.  A small pericardial effusion is present.  The mitral valve is normal in structure. Mild mitral valve regurgitation.  The aortic valve is grossly normal. Aortic valve  regurgitation is not visualized.  The inferior vena cava is normal in size with greater than 50% respiratory variability, suggesting right atrial pressure of 3 mmHg.   VAS [name] ABI WITH/WO TBI performed on 09/26/2022 Resting right ankle-brachial index indicates mild right lower extremity arterial disease. The right toe-brachial index is abnormal.  Resting left ankle-brachial index indicates mild left lower extremity arterial disease.   CT CHEST LUNG CA SCREEN LOW DOSE W/O CM performed on 09/18/2021 Lung-RADS 2S, benign appearance or behavior. Continue annual screening with low-dose chest CT without contrast in 12 months. The "S" modifier above refers to potentially clinically significant non lung cancer related findings. Specifically, there is aortic atherosclerosis, in addition to left main and three-vessel coronary artery disease. Please note that although the presence of coronary artery calcium documents the presence of coronary artery disease, the severity of this disease and any potential stenosis cannot be assessed on this non-gated CT examination. Assessment for potential risk factor modification, dietary  therapy or pharmacologic therapy may be warranted, if clinically indicated. Mild diffuse bronchial wall thickening with very mild centrilobular and paraseptal emphysema; imaging findings suggestive of underlying COPD. There are calcifications of the aortic valve and mitral annulus. Echocardiographic correlation for evaluation of potential valvular dysfunction may be warranted if clinically indicated. Aortic atherosclerosis   Impression and Plan:  Chelsea Barton has been referred for pre-anesthesia review and clearance prior to her undergoing the planned anesthetic and procedural courses. Available labs, pertinent testing, and imaging results were personally reviewed by me in preparation for upcoming operative/procedural course. [facility] medical record has been updated following extensive record review and patient interview with PAT staff.   This patient has been appropriately cleared by cardiology with an overall *** risk of significant perioperative cardiovascular complications. Based on clinical review performed today (09/03/23), barring any significant acute changes in the patient's overall condition, it is anticipated that she will be able to proceed with the planned surgical intervention. Any acute changes in clinical condition may necessitate her procedure being postponed and/or cancelled. Patient will meet with anesthesia team (MD and/or CRNA) on the day of her procedure for preoperative evaluation/assessment. Questions regarding anesthetic course will be fielded at that time.   Pre-surgical instructions were reviewed with the patient during her PAT appointment, and questions were fielded to satisfaction by PAT clinical staff. She has been instructed on which medications that she will need to hold prior to surgery, as well as the ones that have been deemed safe/appropriate to take of the day of her procedure. As part of the general education provided by PAT, patient made aware both verbally and  in writing, that she would need to abstain from the use of any illegal substances during her perioperative course.  She was advised that failure to follow the provided instructions could necessitate case cancellation or result serious perioperative complications up to and including death. Patient encouraged to contact PAT and/or her surgeon's office to discuss any questions or concerns that may arise prior to surgery; verbalized understanding.   Quentin Mulling, MSN, APRN, FNP-C, CEN [facility]  Peri-operative Services Nurse Practitioner Phone: [phone] Fax: [phone] 09/03/23 8:46 AM  NOTE: This note has been prepared using Dragon dictation software. Despite my best ability to proofread, there is always the potential that unintentional transcriptional errors may still occur from this process.

## 2023-09-03 NOTE — Telephone Encounter (Signed)
   Pre-operative Risk Assessment    Patient Name: Chelsea Barton  DOB: 08-25-1960 MRN: [id number]   Date of last office visit: 07/31/23 Ward Givens, FNP Date of next office visit: NONE   Request for Surgical Clearance    Procedure:   APPLICATION, ALLOGRAFT, SKIN (LEFT) DEBRIDEMENT, WOUND (LEFT)  Date of Surgery:  Clearance 09/05/23                                Surgeon:  DR. Lilian Kapur    Surgeon's Group or Practice Name:  [facility] Phone number:  [phone] Fax number:  [phone]   Type of Clearance Requested:   - Medical  - Pharmacy:  Hold Aspirin     Type of Anesthesia:  MAC   Additional requests/questions:    Elpidio Anis   09/03/2023, 9:02 AM

## 2023-09-03 NOTE — Telephone Encounter (Signed)
 Chelsea Fells,  [name] is requesting preoperative cardiac evaluation for allograft, and skin debridement.  She was recently seen by you in clinic on 07/31/2023.  She appeared to be stable from a cardiac standpoint.  Would you be able to provide recommendations on cardiac risk for her upcoming procedure?  Thank you for your help.  Please direct your response to CV DIV preop pool.  Thomasene Ripple. [name] NP-C     09/03/2023, 9:39 AM [facility] [address] Office [phone] Fax [phone]

## 2023-09-04 NOTE — Telephone Encounter (Signed)
 Contacted patient as part of preoperative protocol.  She remains stable from a cardiac standpoint at this time.  Today she denies chest pain, shortness of breath, lower extremity edema, fatigue, palpitations, melena, hematuria, hemoptysis, diaphoresis, weakness, presyncope, syncope, orthopnea, and PND.      Primary Cardiologist: Debbe Odea, MD  Chart reviewed as part of pre-operative protocol coverage. Given past medical history and time since last visit, based on ACC/AHA guidelines, Verba D Alcocer would be at acceptable risk for the planned procedure without further cardiovascular testing.   Her RCRI is high risk, greater than 11% risk of major cardiac event.  She is able to complete greater than 4 METS of physical activity.  Patient was advised that if she develops new symptoms prior to surgery to contact our office to arrange a follow-up appointment.  He verbalized understanding.  Ideally aspirin should be continued without interruption, however if the bleeding risk is too great, aspirin may be held for 5-7 days prior to surgery. Please resume aspirin post operatively when it is felt to be safe from a bleeding standpoint.   I will route this recommendation to the requesting party via Epic fax function and remove from pre-op pool.  Please call with questions.  Thomasene Ripple. Manami Tutor NP-C     09/04/2023, 2:13 PM [facility] [address] Office [phone] Fax [phone]

## 2023-09-05 ENCOUNTER — Ambulatory Visit: Payer: Self-pay | Admitting: Urgent Care

## 2023-09-05 ENCOUNTER — Ambulatory Visit
Admission: RE | Admit: 2023-09-05 | Discharge: 2023-09-05 | Disposition: A | Discharge status: Home / Self Care | Attending: Podiatry | Admitting: Podiatry

## 2023-09-05 ENCOUNTER — Encounter
Admission: RE | Disposition: A | Discharge status: Home / Self Care | Payer: Self-pay | Source: Home / Self Care | Attending: Podiatry

## 2023-09-05 ENCOUNTER — Encounter: Payer: Self-pay | Admitting: Podiatry

## 2023-09-05 ENCOUNTER — Other Ambulatory Visit: Payer: Self-pay

## 2023-09-05 DIAGNOSIS — E669 Obesity, unspecified: Secondary | ICD-10-CM | POA: Insufficient documentation

## 2023-09-05 DIAGNOSIS — Z7985 Long-term (current) use of injectable non-insulin antidiabetic drugs: Secondary | ICD-10-CM | POA: Insufficient documentation

## 2023-09-05 DIAGNOSIS — Z6833 Body mass index (BMI) 33.0-33.9, adult: Secondary | ICD-10-CM | POA: Insufficient documentation

## 2023-09-05 DIAGNOSIS — I2510 Atherosclerotic heart disease of native coronary artery without angina pectoris: Secondary | ICD-10-CM | POA: Diagnosis not present

## 2023-09-05 DIAGNOSIS — E11621 Type 2 diabetes mellitus with foot ulcer: Secondary | ICD-10-CM | POA: Insufficient documentation

## 2023-09-05 DIAGNOSIS — J449 Chronic obstructive pulmonary disease, unspecified: Secondary | ICD-10-CM | POA: Insufficient documentation

## 2023-09-05 DIAGNOSIS — E1151 Type 2 diabetes mellitus with diabetic peripheral angiopathy without gangrene: Secondary | ICD-10-CM | POA: Diagnosis not present

## 2023-09-05 DIAGNOSIS — E1165 Type 2 diabetes mellitus with hyperglycemia: Secondary | ICD-10-CM | POA: Insufficient documentation

## 2023-09-05 DIAGNOSIS — L97422 Non-pressure chronic ulcer of left heel and midfoot with fat layer exposed: Secondary | ICD-10-CM

## 2023-09-05 DIAGNOSIS — D631 Anemia in chronic kidney disease: Secondary | ICD-10-CM | POA: Diagnosis not present

## 2023-09-05 DIAGNOSIS — Z7984 Long term (current) use of oral hypoglycemic drugs: Secondary | ICD-10-CM | POA: Diagnosis not present

## 2023-09-05 DIAGNOSIS — L97522 Non-pressure chronic ulcer of other part of left foot with fat layer exposed: Secondary | ICD-10-CM | POA: Diagnosis not present

## 2023-09-05 DIAGNOSIS — E1122 Type 2 diabetes mellitus with diabetic chronic kidney disease: Secondary | ICD-10-CM | POA: Insufficient documentation

## 2023-09-05 DIAGNOSIS — Z87891 Personal history of nicotine dependence: Secondary | ICD-10-CM | POA: Insufficient documentation

## 2023-09-05 DIAGNOSIS — Z794 Long term (current) use of insulin: Secondary | ICD-10-CM | POA: Diagnosis not present

## 2023-09-05 DIAGNOSIS — I129 Hypertensive chronic kidney disease with stage 1 through stage 4 chronic kidney disease, or unspecified chronic kidney disease: Secondary | ICD-10-CM | POA: Insufficient documentation

## 2023-09-05 DIAGNOSIS — I700 Atherosclerosis of aorta: Secondary | ICD-10-CM | POA: Insufficient documentation

## 2023-09-05 DIAGNOSIS — E08621 Diabetes mellitus due to underlying condition with foot ulcer: Secondary | ICD-10-CM

## 2023-09-05 DIAGNOSIS — N184 Chronic kidney disease, stage 4 (severe): Secondary | ICD-10-CM

## 2023-09-05 HISTORY — PX: ALLOGRAFT APPLICATION: SHX6404

## 2023-09-05 HISTORY — PX: WOUND DEBRIDEMENT: SHX247

## 2023-09-05 LAB — GLUCOSE, CAPILLARY
Glucose-Capillary: 145 mg/dL — ABNORMAL HIGH (ref 70–99)
Glucose-Capillary: 178 mg/dL — ABNORMAL HIGH (ref 70–99)

## 2023-09-05 SURGERY — APPLICATION, ALLOGRAFT, SKIN
Anesthesia: General | Laterality: Left

## 2023-09-05 MED ORDER — FENTANYL CITRATE (PF) 100 MCG/2ML IJ SOLN: 25.0000 ug | INTRAMUSCULAR | Status: DC | PRN

## 2023-09-05 MED ORDER — CHLORHEXIDINE GLUCONATE 0.12 % MT SOLN
15.0000 mL | Freq: Once | OROMUCOSAL | Status: AC
  Administered 2023-09-05: 15 mL via OROMUCOSAL

## 2023-09-05 MED ORDER — HYDRALAZINE HCL 20 MG/ML IJ SOLN
10.0000 mg | Freq: Once | INTRAMUSCULAR | Status: AC
  Administered 2023-09-05: 10 mg via INTRAVENOUS

## 2023-09-05 MED ORDER — MIDAZOLAM HCL 2 MG/2ML IJ SOLN
INTRAMUSCULAR | Status: DC | PRN
  Administered 2023-09-05: 2 mg via INTRAVENOUS

## 2023-09-05 MED ORDER — HYDRALAZINE HCL 20 MG/ML IJ SOLN
10.0000 mg | Freq: Once | INTRAMUSCULAR | Status: AC
  Administered 2023-09-05: 10 mg via INTRAVENOUS
  Filled 2023-09-05: qty 0.5

## 2023-09-05 MED ORDER — OXYCODONE HCL 5 MG/5ML PO SOLN: 5.0000 mg | Freq: Once | ORAL | Status: DC | PRN

## 2023-09-05 MED ORDER — SODIUM CHLORIDE 0.9 % IV SOLN: INTRAVENOUS | Status: DC

## 2023-09-05 MED ORDER — PHENYLEPHRINE HCL (PRESSORS) 10 MG/ML IV SOLN
INTRAVENOUS | Status: DC | PRN
  Administered 2023-09-05: 100 ug via INTRAVENOUS
  Administered 2023-09-05: 80 ug via INTRAVENOUS
  Administered 2023-09-05: 100 ug via INTRAVENOUS

## 2023-09-05 MED ORDER — CEFAZOLIN SODIUM-DEXTROSE 2-4 GM/100ML-% IV SOLN
2.0000 g | INTRAVENOUS | Status: AC
  Administered 2023-09-05: 2 g via INTRAVENOUS

## 2023-09-05 MED ORDER — ONDANSETRON HCL 4 MG/2ML IJ SOLN
INTRAMUSCULAR | Status: DC | PRN
  Administered 2023-09-05: 4 mg via INTRAVENOUS

## 2023-09-05 MED ORDER — BUPIVACAINE HCL (PF) 0.5 % IJ SOLN
INTRAMUSCULAR | Status: AC
  Filled 2023-09-05: qty 30

## 2023-09-05 MED ORDER — FENTANYL CITRATE (PF) 100 MCG/2ML IJ SOLN
INTRAMUSCULAR | Status: DC | PRN
  Administered 2023-09-05: 50 ug via INTRAVENOUS

## 2023-09-05 MED ORDER — OXYCODONE HCL 5 MG PO TABS: 5.0000 mg | ORAL_TABLET | Freq: Once | ORAL | Status: DC | PRN

## 2023-09-05 MED ORDER — SUCCINYLCHOLINE CHLORIDE 200 MG/10ML IV SOSY
PREFILLED_SYRINGE | INTRAVENOUS | Status: DC | PRN
  Administered 2023-09-05: 100 mg via INTRAVENOUS

## 2023-09-05 MED ORDER — PROPOFOL 10 MG/ML IV BOLUS
INTRAVENOUS | Status: AC
  Filled 2023-09-05: qty 20

## 2023-09-05 MED ORDER — ORAL CARE MOUTH RINSE: 15.0000 mL | Freq: Once | OROMUCOSAL | Status: AC

## 2023-09-05 MED ORDER — AMOXICILLIN-POT CLAVULANATE 875-125 MG PO TABS: 1.0000 | ORAL_TABLET | Freq: Two times a day (BID) | ORAL | 0 refills | Status: DC

## 2023-09-05 MED ORDER — LIDOCAINE HCL (CARDIAC) PF 100 MG/5ML IV SOSY
PREFILLED_SYRINGE | INTRAVENOUS | Status: DC | PRN
  Administered 2023-09-05: 50 mg via INTRAVENOUS

## 2023-09-05 MED ORDER — BUPIVACAINE HCL 0.5 % IJ SOLN
INTRAMUSCULAR | Status: DC | PRN
  Administered 2023-09-05: 10 mL

## 2023-09-05 MED ORDER — CEFAZOLIN SODIUM-DEXTROSE 2-4 GM/100ML-% IV SOLN
INTRAVENOUS | Status: AC
  Filled 2023-09-05: qty 100

## 2023-09-05 MED ORDER — HYDRALAZINE HCL 20 MG/ML IJ SOLN
INTRAMUSCULAR | Status: AC
  Filled 2023-09-05: qty 1

## 2023-09-05 MED ORDER — FENTANYL CITRATE (PF) 100 MCG/2ML IJ SOLN
INTRAMUSCULAR | Status: AC
  Filled 2023-09-05: qty 2

## 2023-09-05 MED ORDER — MIDAZOLAM HCL 2 MG/2ML IJ SOLN
INTRAMUSCULAR | Status: AC
  Filled 2023-09-05: qty 2

## 2023-09-05 MED ORDER — PROPOFOL 10 MG/ML IV BOLUS
INTRAVENOUS | Status: DC | PRN
  Administered 2023-09-05: 20 mg via INTRAVENOUS
  Administered 2023-09-05: 200 mg via INTRAVENOUS
  Administered 2023-09-05: 50 mg via INTRAVENOUS

## 2023-09-05 MED ORDER — CHLORHEXIDINE GLUCONATE 0.12 % MT SOLN
OROMUCOSAL | Status: AC
  Filled 2023-09-05: qty 15

## 2023-09-05 SURGICAL SUPPLY — 36 items
BLADE MED AGGRESSIVE (BLADE) IMPLANT
BLADE OSC/SAGITTAL MD 5.5X18 (BLADE) IMPLANT
BLADE SURG 15 STRL LF DISP TIS (BLADE) IMPLANT
BLADE SURG MINI STRL (BLADE) IMPLANT
BNDG ELASTIC 4X5.8 VLCR NS LF (GAUZE/BANDAGES/DRESSINGS) IMPLANT
BNDG ESMARCH 4X12 STRL LF (GAUZE/BANDAGES/DRESSINGS) ×1 IMPLANT
BNDG GAUZE DERMACEA FLUFF 4 (GAUZE/BANDAGES/DRESSINGS) ×1 IMPLANT
CNTNR URN SCR LID CUP LEK RST (MISCELLANEOUS) IMPLANT
CUFF TOURN SGL QUICK 12 (TOURNIQUET CUFF) IMPLANT
CUFF TOURN SGL QUICK 18X4 (TOURNIQUET CUFF) IMPLANT
ELECT REM PT RETURN 9FT ADLT (ELECTROSURGICAL) ×1 IMPLANT
ELECTRODE REM PT RTRN 9FT ADLT (ELECTROSURGICAL) ×1 IMPLANT
GAUZE SPONGE 4X4 12PLY STRL (GAUZE/BANDAGES/DRESSINGS) ×2 IMPLANT
GLOVE BIOGEL M 7.0 STRL (GLOVE) ×1 IMPLANT
GLOVE BIOGEL PI IND STRL 7.5 (GLOVE) ×1 IMPLANT
GOWN STRL REUS W/ TWL LRG LVL3 (GOWN DISPOSABLE) ×2 IMPLANT
GRAFT SKIN WND SURGIBIND 3X7 (Tissue) IMPLANT
KIT TURNOVER KIT A (KITS) ×1 IMPLANT
LABEL OR SOLS (LABEL) ×1 IMPLANT
MANIFOLD NEPTUNE II (INSTRUMENTS) ×1 IMPLANT
NDL BIOPSY JAMSHIDI 11X6 (NEEDLE) IMPLANT
NDL HYPO 25X1 1.5 SAFETY (NEEDLE) ×2 IMPLANT
NEEDLE BIOPSY JAMSHIDI 11X6 (NEEDLE) IMPLANT
NEEDLE HYPO 25X1 1.5 SAFETY (NEEDLE) ×1 IMPLANT
NS IRRIG 500ML POUR BTL (IV SOLUTION) ×1 IMPLANT
PACK EXTREMITY ARMC (MISCELLANEOUS) ×1 IMPLANT
PAD ABD DERMACEA PRESS 5X9 (GAUZE/BANDAGES/DRESSINGS) IMPLANT
SOL PREP PVP 2OZ (MISCELLANEOUS) ×1 IMPLANT
SOLUTION PREP PVP 2OZ (MISCELLANEOUS) ×1 IMPLANT
STAPLER SKIN PROX 35W (STAPLE) IMPLANT
SURGILUBE 2OZ TUBE FLIPTOP (MISCELLANEOUS) IMPLANT
SUT ETHILON 3-0 FS-10 30 BLK (SUTURE) ×1 IMPLANT
SUTURE EHLN 3-0 FS-10 30 BLK (SUTURE) ×1 IMPLANT
SWAB CULTURE AMIES ANAERIB BLU (MISCELLANEOUS) IMPLANT
SYR 10ML LL (SYRINGE) ×1 IMPLANT
TRAP FLUID SMOKE EVACUATOR (MISCELLANEOUS) ×1 IMPLANT

## 2023-09-05 NOTE — Anesthesia Procedure Notes (Signed)
 Procedure Name: Intubation Date/Time: 09/05/2023 2:09 PM  Performed by: Jaye Beagle, CRNAPre-anesthesia Checklist: Patient identified, Emergency Drugs available, Suction available and Patient being monitored Patient Re-evaluated:Patient Re-evaluated prior to induction Oxygen Delivery Method: Circle system utilized Preoxygenation: Pre-oxygenation with 100% oxygen Induction Type: IV induction and Rapid sequence Laryngoscope Size: McGrath and 3 Grade View: Grade I Tube type: Oral Number of attempts: 1 Airway Equipment and Method: Stylet and Oral airway Placement Confirmation: ETT inserted through vocal cords under direct vision, positive ETCO2 and breath sounds checked- equal and bilateral Secured at: 21 cm Tube secured with: Tape Dental Injury: Teeth and Oropharynx as per pre-operative assessment

## 2023-09-05 NOTE — Progress Notes (Addendum)
 PREOPERATIVE H&P  Chief Complaint: left foot wound  HPI: Chelsea Barton is a 63 y.o. female who presents for evaluation of left foot ulcer. It has been present for over one year and has been failing to heal despite local wound care. She has had unsuccessful non operative treatment and presents for surgery today. Pain is rated as mild. She reports no changes to their PMH or medications.  Past Medical History:  Diagnosis Date   Aortic atherosclerosis (HCC)    CKD (chronic kidney disease), stage IV (HCC)    not on dialysis   COPD (chronic obstructive pulmonary disease) (HCC)    Coronary artery disease    Diastolic dysfunction    a.) TTE 07/18/2021: EF 50-55%, mod LVH, mild LAE, mild-mod MR, AoV sclerosis, G1DD; b.) TTE 08/22/2023: EF 55-60%, mild LVH, G2DD, mild LAE, RVSF norm, mid MR   Esophageal thickening    Hyperlipidemia    Hypertension    Impaired vision    Obesity    Osteomyelitis of left foot (HCC)    a.) s/p LEFT transmetatarsal amputation   PAD (peripheral artery disease) (HCC)    a.) s/p PTA and stenting of LEFT SFA 04/13/2021 --> 6 x 200 mm LifeStent with distal edge just beyond Hunter's canal extending proximally and a 6 x 80 mm LifeStent to the origin of the SFA (stents overlapped by approximately 10 mm); b.) LE vascular duplex 09/26/2022 --> 75-99% stenosis LEFT SFA; c. 01/2023 PTA: L SFA atherectomy and PTA of RCIA.   Proteinuria    Secondary hyperparathyroidism of renal origin (HCC)    Type 2 diabetes mellitus treated with insulin (HCC)    a.) uses Dexcom CGM and OmniPod insulin pump   Past Surgical History:  Procedure Laterality Date   AMPUTATION TOE  04/10/2021   Procedure: AMPUTATION 2nd TOE, left foot;  Surgeon: Felecia Shelling, DPM;  Location: [facility];  Service: Podiatry;;   CATARACT EXTRACTION W/ INTRAOCULAR LENS IMPLANT Right    GRAFT APPLICATION Left 12/26/2021   Procedure: GRAFT APPLICATION-left foot;  Surgeon: Edwin Cap, DPM;  Location: [facility];   Service: Podiatry;  Laterality: Left;   GRAFT APPLICATION Left 10/04/2022   Procedure: GRAFT APPLICATION;  Surgeon: Edwin Cap, DPM;  Location: [facility];  Service: Podiatry;  Laterality: Left;   I & D EXTREMITY Left 12/26/2021   Procedure: IRRIGATION AND DEBRIDEMENT EXTREMITY-left foot;  Surgeon: Edwin Cap, DPM;  Location: [facility];  Service: Podiatry;  Laterality: Left;   INCISION AND DRAINAGE Left 04/18/2021   Procedure: INCISION AND DRAINAGE;  Surgeon: Edwin Cap, DPM;  Location: [facility];  Service: Podiatry;  Laterality: Left;   INCISION AND DRAINAGE OF WOUND Left 04/10/2021   Procedure: IRRIGATION AND DEBRIDEMENT WOUND;  Surgeon: Felecia Shelling, DPM;  Location: [facility];  Service: Podiatry;  Laterality: Left;   LOWER EXTREMITY ANGIOGRAPHY Left 01/21/2023   Procedure: Lower Extremity Angiography;  Surgeon: Renford Dills, MD;  Location: [facility];  Service: Cardiovascular;  Laterality: Left;   PERIPHERAL VASCULAR BALLOON ANGIOPLASTY Left 04/13/2021   Procedure: PERIPHERAL VASCULAR BALLOON ANGIOPLASTY;  Surgeon: Renford Dills, MD;  Location: [facility];  Service: Cardiovascular;  Laterality: Left;   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   Procedure: TRANSMETATARSAL AMPUTATION;  Surgeon: Candelaria Stagers, DPM;  Location: [facility];  Service: Podiatry;  Laterality: Left;   WOUND DEBRIDEMENT Left 10/04/2022   Procedure: DEBRIDEMENT WOUND;  Surgeon: Edwin Cap, DPM;  Location: [facility];  Service: Podiatry;  Laterality: Left;   Social History   Socioeconomic History   Marital status: Single    Spouse name: Not on file   Number of children: Not on file   Years of education: Not on file   Highest education level: Not on file  Occupational History   Not on file  Tobacco Use   Smoking status: Former    Current packs/day: 0.00    Average packs/day: 1 pack/day for 25.0 years (25.0 ttl pk-yrs)    Types: Cigarettes    Start date:  06/18/1980    Quit date: 06/18/2005    Years since quitting: 18.2   Smokeless tobacco: Never  Vaping Use   Vaping status: Never Used  Substance and Sexual Activity   Alcohol use: No   Drug use: Not Currently    Comment: 30 + yrs ago cocaine   Sexual activity: Not on file  Other Topics Concern   Not on file  Social History Narrative   Has a great niece that she keeps a lot, 2 yo, '[name]'.  Stays weekends with her.       Has 8 great nieces, 3 nieces, 6 nephews   Social Drivers of Corporate investment banker Strain: Medium Risk (06/14/2021)   Overall Financial Resource Strain (CARDIA)    Difficulty of Paying Living Expenses: Somewhat hard  Food Insecurity: Food Insecurity Present (05/31/2021)   Hunger Vital Sign    Worried About Running Out of Food in the Last Year: Sometimes true    Ran Out of Food in the Last Year: Sometimes true  Transportation Needs: No Transportation Needs (08/09/2021)   PRAPARE - Administrator, Civil Service (Medical): No    Lack of Transportation (Non-Medical): No  Physical Activity: Sufficiently Active (07/04/2021)   Exercise Vital Sign    Days of Exercise per Week: 7 days    Minutes of Exercise per Session: 30 min  Stress: Not on file  Social Connections: Not on file   Family History  Problem Relation Age of Onset   Kidney disease Sister    Diabetes Sister        3/5 sisters have DM   Breast cancer Neg Hx    Colon cancer Neg Hx    Thyroid cancer Neg Hx    No Known Allergies Prior to Admission medications   Medication Sig Start Date End Date Taking? Authorizing Provider  aspirin 81 MG EC tablet Take 1 tablet (81 mg total) by mouth daily. Swallow whole. 02/29/20  Yes Allegra Grana, FNP  carvedilol (COREG) 12.5 MG tablet TAKE 1 TABLET(12.5 MG TOTAL) BY MOUTH 2 TIMES DAILY WITH A MEAL 04/09/23  Yes Arnett, Lyn Records, FNP  enalapril (VASOTEC) 20 MG tablet Take 20 mg by mouth 2 (two) times daily.   Yes [provider]   hydrALAZINE (APRESOLINE) 10 MG tablet TAKE 1/2 TABLET BY MOUTH THREE TIMES DAILY AS NEEDED IF BLOOD PRESSURE IS OVER 140/90 07/16/23  Yes Arnett, Lyn Records, FNP  insulin glargine (LANTUS SOLOSTAR) 100 UNIT/ML Solostar Pen Inject 40 Units into the skin at bedtime. 07/04/23  Yes Allegra Grana, FNP  Multiple Vitamin (MULTIVITAMIN) capsule Take 1 capsule by mouth daily.   Yes [provider]  NIFEdipine (PROCARDIA-XL/NIFEDICAL-XL) 30 MG 24 hr tablet TAKE 1 TABLET(30 MG) BY MOUTH DAILY 08/04/23  Yes Arnett, Lyn Records, FNP  NOVOLOG FLEXPEN 100 UNIT/ML FlexPen 5 breakfast- 10 lunch- 12 at night    40 UNITS DAILY MAX 07/28/23  Yes Shamleffer, Konrad Dolores, MD  olmesartan (BENICAR) 40 MG tablet Take 40 mg by mouth daily.   Yes [provider]  rosuvastatin (CRESTOR) 10 MG tablet Take 1 tablet (10 mg total) by mouth daily. 07/09/23  Yes Allegra Grana, FNP  Accu-Chek Softclix Lancets lancets Use as directed Patient not taking: Reported on 08/27/2023 07/04/23   Allegra Grana, FNP  Blood Glucose Monitoring Suppl DEVI 1 each by Does not apply route in the morning, at noon, and at bedtime. May substitute to any manufacturer covered by patient's insurance. Patient not taking: Reported on 08/27/2023 12/24/22   Allegra Grana, FNP  Continuous Glucose Receiver (DEXCOM G6 RECEIVER) DEVI Use as directed Patient not taking: Reported on 08/27/2023 12/24/22   Allegra Grana, FNP  Continuous Glucose Sensor (DEXCOM G6 SENSOR) MISC PLACE A NEW SENSOR EVERY 10 DAYS. USE TO CHECK BLOOD SUGAR AT LEAST 4 TIMES DAILY 07/04/23   Allegra Grana, FNP  Continuous Glucose Transmitter (DEXCOM G6 TRANSMITTER) MISC USE AS DIRECTED 08/26/23   Shamleffer, Konrad Dolores, MD  dapagliflozin propanediol (FARXIGA) 10 MG TABS tablet TAKE 1 TABLET(10 MG) BY MOUTH DAILY Patient not taking: Reported on 09/02/2023 12/02/22   Allegra Grana, FNP  glucose blood (ACCU-CHEK AVIVA PLUS) test strip Used to check  blood sugar 4x daily. Patient not taking: Reported on 08/27/2023 08/27/21   Allegra Grana, FNP  Insulin Pen Needle (EASY TOUCH PEN NEEDLES) 31G X 5 MM MISC Check sugar 3-4 daily Patient not taking: Reported on 08/27/2023 07/26/21   Shamleffer, Konrad Dolores, MD  Insulin Pen Needle 31G X 5 MM MISC 1 Device by Does not apply route in the morning, at noon, in the evening, and at bedtime. Patient not taking: Reported on 08/27/2023 07/04/23   Allegra Grana, FNP  Semaglutide,0.25 or 0.5MG /DOS, 2 MG/3ML SOPN Inject 0.25 mg into the skin once a week. Patient not taking: Reported on 08/27/2023 07/18/23   Allegra Grana, FNP  traMADol (ULTRAM) 50 MG tablet Take 1 tablet (50 mg total) by mouth every 6 (six) hours as needed. Patient not taking: Reported on 08/27/2023 10/04/22 10/04/23  Edwin Cap, DPM  Vitamin D, Cholecalciferol, 10 MCG (400 UNIT) CAPS Take 1 capsule by mouth daily.    [provider]     Positive ROS:    All other systems have been reviewed and were otherwise negative with the exception of those mentioned in the HPI and as above.  Physical Exam: Vitals:   09/05/23 1228 09/05/23 1300  BP: (!) 201/91 (!) 195/95  Pulse: 76 78  Temp: (!) 97.1 F (36.2 C)   SpO2: 100%     General: Alert, no acute distress Cardiovascular: No pedal edema. RRR. No murmurs noted Respiratory: No cyanosis, no use of accessory musculature. Breathing unlabored. Lungs clear, no wheezing GI: No organomegaly, abdomen is soft and non-tender Psychiatric: Patient is competent for consent with normal mood and affect Lymphatic: No axillary or cervical lymphadenopathy   LE Focused Exam: warm, good capillary refill, neuropathic, full thickness ulcer anterior TMA measuring 3.0 x 2.5 x 0.8cm   Assessment/Plan: LEFT FOOT ULCER Plan for Procedure(s): APPLICATION, ALLOGRAFT, SKIN DEBRIDEMENT, WOUND  All risks, benefits and potential complications including but not limited to pain, swelling,  infection, scar, numbness which may be temporary or permanent, chronic pain, stiffness, nerve pain or damage, wound healing problems, bone healing problems including delayed or non-union, blood clots, cardiopulmonary complications, morbidity, mortality discussed prior to the procedure. All questions  addressed. Informed consent signed and reviewed. She is willing to proceed with surgery today.       Edwin Cap, DPM 09/05/2023 1:24 PM

## 2023-09-05 NOTE — Discharge Instructions (Signed)
 Post-Surgery Instructions  1. If you are recuperating from surgery anywhere other than home, please be sure to leave [name] a number where you can be reached. 2. Go directly home and rest. 3. The keep operated foot (or feet) elevated six inches above the hip when sitting or lying down. 4. Support the elevated foot and leg with pillows under the calf. DO NOT PLACE PILLOWS UNDER THE KNEE. 5. DO NOT REMOVE or get your bandages wet. This will increase your chances of getting an infection. 6. Wear your surgical shoe at all times when you are up. 7. A limited amount of pain and swelling may occur. The skin may take on a bruised appearance. This is no cause for alarm. 8. For slight pain and swelling, apply an ice pack directly over the bandage for 15 minutes every hour. Continue icing until seen in the office. DO NOT apply any form of heat to the area. 9. Have prescription(s) filled immediately and take as directed. 10. Drink lots of liquids, water, and juice. 11. CALL THE OFFICE IMMEDIATELY IF: a. Bleeding continues b. Pain increases and/or does not respond to medication c. Bandage or cast appears too tight d. Any liquids (water, coffee, etc.) have spilled on your bandages. e. Tripping, falling, or stubbing the surgical foot f. If your temperature rises above 101 g. If you have ANY questions at all 12. Please use the crutches, knee scooter, or walker you have prescribed, rented, or purchased. If you are non-weight bearing DO NOT put weight on the operated foot for _________ days. If you are weight-bearing, follow your physician's instructions. You are expected to be: ? weight-bearing  to the heel 13. Special Instructions: Change the dressing on Sunday, 09/07/2023, apply the surgical lubricant to the nonadherent gauze that is stapled on.  Dress it with gauze, gauze roll and Ace wrap.  Change it every day until you see me again.  14. Your next appointment is: 09/12/2023 11:15 AM   If you need to  reach the nurse for any reason, please call: [place]: [phone] [place]: [phone] [place]: [phone]

## 2023-09-05 NOTE — Transfer of Care (Signed)
 Immediate Anesthesia Transfer of Care Note  Patient: Chelsea Barton  Procedure(s) Performed: APPLICATION, ALLOGRAFT, SKIN (Left) DEBRIDEMENT, WOUND (Left)  Patient Location: PACU  Anesthesia Type:General  Level of Consciousness: awake, alert , and oriented  Airway & Oxygen Therapy: Patient Spontanous Breathing and Patient connected to nasal cannula oxygen  Post-op Assessment: Report given to RN and Post -op Vital signs reviewed and stable  Post vital signs: stable  Last Vitals:  Vitals Value Taken Time  BP 107/53 09/05/23 1448  Temp    Pulse 73 09/05/23 1451  Resp 17 09/05/23 1451  SpO2 98 % 09/05/23 1451  Vitals shown include unfiled device data.  Last Pain:  Vitals:   09/05/23 1228  TempSrc: Oral  PainSc: 0-No pain         Complications: No notable events documented.

## 2023-09-05 NOTE — Anesthesia Preprocedure Evaluation (Addendum)
 Anesthesia Evaluation  Patient identified by MRN, date of birth, ID band Patient awake    Reviewed: Allergy & Precautions, H&P , NPO status , Patient's Chart, lab work & pertinent test results, reviewed documented beta blocker date and time   History of Anesthesia Complications Negative for: history of anesthetic complications  Airway Mallampati: III  TM Distance: >3 FB Neck ROM: full    Dental  (+) Dental Advidsory Given, Edentulous Upper, Edentulous Lower   Pulmonary neg shortness of breath, former smoker   Pulmonary exam normal        Cardiovascular Exercise Tolerance: Good hypertension, Pt. on medications and Pt. on home beta blockers (-) angina + CAD and + Peripheral Vascular Disease  (-) Past MI and (-) Cardiac Stents Normal cardiovascular exam(-) dysrhythmias + Valvular Problems/Murmurs ( mitral insufficiency)    LVH   Neuro/Psych  Neuromuscular disease  negative psych ROS   GI/Hepatic negative GI ROS, Neg liver ROS,,,  Endo/Other  diabetes, Poorly Controlled, Insulin Dependent  secondary hyperparathyroidism  Renal/GU      Musculoskeletal   Abdominal   Peds  Hematology  (+) Blood dyscrasia, anemia   Anesthesia Other Findings Patient took her GLP1 yesterday. Denies any symptoms like nausea, vomiting, bloating, or acid reflex. Patient given the option of rescheduling vs proceeding with ETT. Patient opted for the ETT. Risks explained.    Past Medical History: No date: Diabetes mellitus No date: Hyperlipidemia No date: Hypertension  BMI    Body Mass Index: 31.62 kg/m      Reproductive/Obstetrics negative OB ROS                             Anesthesia Physical Anesthesia Plan  ASA: 3  Anesthesia Plan: General   Post-op Pain Management:    Induction: Intravenous  PONV Risk Score and Plan: 3 and Ondansetron, Dexamethasone and Propofol infusion  Airway Management Planned:  Oral ETT  Additional Equipment:   Intra-op Plan:   Post-operative Plan:   Informed Consent: I have reviewed the patients History and Physical, chart, labs and discussed the procedure including the risks, benefits and alternatives for the proposed anesthesia with the patient or authorized representative who has indicated his/her understanding and acceptance.     Dental Advisory Given  Plan Discussed with: Anesthesiologist, CRNA and Surgeon  Anesthesia Plan Comments: (Patient consented for risks of anesthesia including but not limited to:  - adverse reactions to medications - risk of airway placement if required - damage to eyes, teeth, lips or other oral mucosa - nerve damage due to positioning  - sore throat or hoarseness - Damage to heart, brain, nerves, lungs, other parts of body or loss of life  Patient voiced understanding and assent.)       Anesthesia Quick Evaluation

## 2023-09-05 NOTE — Brief Op Note (Signed)
 09/05/2023  2:48 PM  PATIENT:  Paola D Takach  63 y.o. female  PRE-OPERATIVE DIAGNOSIS:  LEFT FOOT ULCER  POST-OPERATIVE DIAGNOSIS:  LEFT FOOT ULCER  PROCEDURE:  Procedure(s) with comments: APPLICATION, ALLOGRAFT, SKIN (Left) DEBRIDEMENT, WOUND (Left) - 3.5 x 2.5  SURGEON:  Surgeons and Role:    * Jahlen Bollman, Rachelle Hora, DPM - Primary  PHYSICIAN ASSISTANT:   ASSISTANTS: none   ANESTHESIA:   local and MAC  EBL:  5cc   BLOOD ADMINISTERED:none  DRAINS: none   LOCAL MEDICATIONS USED:  BUPIVICAINE  and Amount: 10 ml  SPECIMEN:  No Specimen  DISPOSITION OF SPECIMEN:  N/A  COUNTS:  YES  TOURNIQUET:  * No tourniquets in log *  DICTATION: .Note written in EPIC  PLAN OF CARE: Discharge to home after PACU  PATIENT DISPOSITION:  PACU - hemodynamically stable.   Delay start of Pharmacological VTE agent (>24hrs) due to surgical blood loss or risk of bleeding: no

## 2023-09-05 NOTE — Anesthesia Postprocedure Evaluation (Signed)
 Anesthesia Post Note  Patient: Chelsea Barton  Procedure(s) Performed: APPLICATION, ALLOGRAFT, SKIN (Left) DEBRIDEMENT, WOUND (Left)  Patient location during evaluation: PACU Anesthesia Type: General Level of consciousness: awake and alert Pain management: pain level controlled Vital Signs Assessment: post-procedure vital signs reviewed and stable Respiratory status: spontaneous breathing, nonlabored ventilation, respiratory function stable and patient connected to nasal cannula oxygen Cardiovascular status: blood pressure returned to baseline and stable Postop Assessment: no apparent nausea or vomiting Anesthetic complications: no  No notable events documented.   Last Vitals:  Vitals:   09/05/23 1453 09/05/23 1500  BP:  (!) 109/58  Pulse: 74 76  Resp: 15 15  Temp:    SpO2: 97% 95%    Last Pain:  Vitals:   09/05/23 1500  TempSrc:   PainSc: 0-No pain                 Stephanie Coup

## 2023-09-06 DIAGNOSIS — E08621 Diabetes mellitus due to underlying condition with foot ulcer: Secondary | ICD-10-CM

## 2023-09-06 NOTE — Op Note (Signed)
 Patient Name: Chelsea Barton DOB: 06-04-1961  MRN: [id number]   Date of Service: 09/05/2023  Surgeon: Dr. Sharl Ma, DPM Assistants: None Pre-operative Diagnosis:  LEFT chronic diabetic nonhealing foot ulcer Post-operative Diagnosis:  LEFT chronic diabetic nonhealing foot ulcer Procedures: Wound bed preparation of chronic nonhealing ulceration 12 cm Application of skin substitute 12 cm Pathology/Specimens: * No specimens in log * Anesthesia: MAC with local Hemostasis: * No tourniquets in log * Estimated Blood Loss: 0 mL Materials:  Implant Name Type Inv. Item Serial No. Manufacturer Lot No. LRB No. Used Action  GRAFT SKIN WND SURGIBIND 3X7 - [id number] Tissue GRAFT SKIN WND SURGIBIND 3X7 [id number] [employer] [id number] Left 1 Implanted   Medications: 10 cc bupivacaine 0.5% plain Complications: No complication noted  Indications for Procedure:  This is a 63 y.o. female with a history of a chronic nonhealing ulceration at her previous transmetatarsal amputation site.  The ulceration has been present for over 1 year and is failed to respond to continuous nonsurgical local wound care.  She has a history of type 2 diabetes that has waxed and waned between controlled and uncontrolled.  A comprehensive diabetic management plan with her endocrinologist.  She has a history of PAD and this has been managed by vascular surgery and underwent revascularization on 01/21/2023.  Her albumin is adequate at 3.6 g/dL.  We discussed debridement of the nonhealing wound and application of a skin substitute to encourage further epithelization. All risks, benefits and potential complications discussed prior to the procedure. All questions addressed. Informed consent signed and reviewed.      Procedure in Detail: Patient was identified in pre-operative holding area. Formal consent was signed and the left lower extremity was marked. Patient was brought back to the operating room. Anesthesia was  induced. The extremity was prepped and draped in the usual sterile fashion. Timeout was taken to confirm patient name, laterality, and procedure prior to incision.   Attention was then directed to the left lower extremity where a local field block with 10 cc of Marcaine was applied.  The wound was sharply debrided of all nonviable tissue hyperkeratosis, a significant amount of this was present as well as eschar and slough with a scalpel and curette in an excisional manner to the subcutaneous layer.  Following preparation of the wound bed measured 3.0 x 4.0 x 0.3 cm.  Hemostasis was achieved with cautery.  Kerecis 3.0 cm x 7.0 cm skin substitute was then selected, cut to fit and inset into the wound bed and moistened with saline.  The graft was affixed to the wound bed with Adaptic and skin staples.  Approximately 15 units of the Kerecis was used and 6 was wasted.  Surgical lubricant was applied over this to maintain a moist wound healing environment and secondary dressings applied including 4 x 4 gauze ABD pad Kerlix and an Ace wrap under light compression. Patient tolerated the procedure well.    Disposition: Following a period of post-operative monitoring, patient will be transferred to home.  She will change the dressing in 48 hours apply a surgical lubricant to maintain a moist wound healing environment and then change the dressing daily with this.

## 2023-09-07 NOTE — H&P (Signed)
 PREOPERATIVE H&P  Chief Complaint: left foot wound  HPI: Chelsea Barton is a 63 y.o. female who presents for evaluation of left foot ulcer. It has been present for over one year and has been failing to heal despite local wound care. She has had unsuccessful non operative treatment and presents for surgery today. Pain is rated as mild. She reports no changes to their PMH or medications.  Past Medical History:  Diagnosis Date   Aortic atherosclerosis (HCC)    CKD (chronic kidney disease), stage IV (HCC)    not on dialysis   COPD (chronic obstructive pulmonary disease) (HCC)    Coronary artery disease    Diastolic dysfunction    a.) TTE 07/18/2021: EF 50-55%, mod LVH, mild LAE, mild-mod MR, AoV sclerosis, G1DD; b.) TTE 08/22/2023: EF 55-60%, mild LVH, G2DD, mild LAE, RVSF norm, mid MR   Esophageal thickening    Hyperlipidemia    Hypertension    Impaired vision    Obesity    Osteomyelitis of left foot (HCC)    a.) s/p LEFT transmetatarsal amputation   PAD (peripheral artery disease) (HCC)    a.) s/p PTA and stenting of LEFT SFA 04/13/2021 --> 6 x 200 mm LifeStent with distal edge just beyond Hunter's canal extending proximally and a 6 x 80 mm LifeStent to the origin of the SFA (stents overlapped by approximately 10 mm); b.) LE vascular duplex 09/26/2022 --> 75-99% stenosis LEFT SFA; c. 01/2023 PTA: L SFA atherectomy and PTA of RCIA.   Proteinuria    Secondary hyperparathyroidism of renal origin (HCC)    Type 2 diabetes mellitus treated with insulin (HCC)    a.) uses Dexcom CGM and OmniPod insulin pump   Past Surgical History:  Procedure Laterality Date   AMPUTATION TOE  04/10/2021   Procedure: AMPUTATION 2nd TOE, left foot;  Surgeon: Felecia Shelling, DPM;  Location: [facility];  Service: Podiatry;;   CATARACT EXTRACTION W/ INTRAOCULAR LENS IMPLANT Right    GRAFT APPLICATION Left 12/26/2021   Procedure: GRAFT APPLICATION-left foot;  Surgeon: Edwin Cap, DPM;  Location: [facility];   Service: Podiatry;  Laterality: Left;   GRAFT APPLICATION Left 10/04/2022   Procedure: GRAFT APPLICATION;  Surgeon: Edwin Cap, DPM;  Location: [facility];  Service: Podiatry;  Laterality: Left;   I & D EXTREMITY Left 12/26/2021   Procedure: IRRIGATION AND DEBRIDEMENT EXTREMITY-left foot;  Surgeon: Edwin Cap, DPM;  Location: [facility];  Service: Podiatry;  Laterality: Left;   INCISION AND DRAINAGE Left 04/18/2021   Procedure: INCISION AND DRAINAGE;  Surgeon: Edwin Cap, DPM;  Location: [facility];  Service: Podiatry;  Laterality: Left;   INCISION AND DRAINAGE OF WOUND Left 04/10/2021   Procedure: IRRIGATION AND DEBRIDEMENT WOUND;  Surgeon: Felecia Shelling, DPM;  Location: [facility];  Service: Podiatry;  Laterality: Left;   LOWER EXTREMITY ANGIOGRAPHY Left 01/21/2023   Procedure: Lower Extremity Angiography;  Surgeon: Renford Dills, MD;  Location: [facility];  Service: Cardiovascular;  Laterality: Left;   PERIPHERAL VASCULAR BALLOON ANGIOPLASTY Left 04/13/2021   Procedure: PERIPHERAL VASCULAR BALLOON ANGIOPLASTY;  Surgeon: Renford Dills, MD;  Location: [facility];  Service: Cardiovascular;  Laterality: Left;   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   Procedure: TRANSMETATARSAL AMPUTATION;  Surgeon: Candelaria Stagers, DPM;  Location: [facility];  Service: Podiatry;  Laterality: Left;   WOUND DEBRIDEMENT Left 10/04/2022   Procedure: DEBRIDEMENT WOUND;  Surgeon: Edwin Cap, DPM;  Location: [facility];  Service: Podiatry;  Laterality: Left;   Social History   Socioeconomic History   Marital status: Single    Spouse name: Not on file   Number of children: Not on file   Years of education: Not on file   Highest education level: Not on file  Occupational History   Not on file  Tobacco Use   Smoking status: Former    Current packs/day: 0.00    Average packs/day: 1 pack/day for 25.0 years (25.0 ttl pk-yrs)    Types: Cigarettes    Start date:  06/18/1980    Quit date: 06/18/2005    Years since quitting: 18.2   Smokeless tobacco: Never  Vaping Use   Vaping status: Never Used  Substance and Sexual Activity   Alcohol use: No   Drug use: Not Currently    Comment: 30 + yrs ago cocaine   Sexual activity: Not on file  Other Topics Concern   Not on file  Social History Narrative   Has a great niece that she keeps a lot, 2 yo, '[name]'.  Stays weekends with her.       Has 8 great nieces, 3 nieces, 6 nephews   Social Drivers of Corporate investment banker Strain: Medium Risk (06/14/2021)   Overall Financial Resource Strain (CARDIA)    Difficulty of Paying Living Expenses: Somewhat hard  Food Insecurity: Food Insecurity Present (05/31/2021)   Hunger Vital Sign    Worried About Running Out of Food in the Last Year: Sometimes true    Ran Out of Food in the Last Year: Sometimes true  Transportation Needs: No Transportation Needs (08/09/2021)   PRAPARE - Administrator, Civil Service (Medical): No    Lack of Transportation (Non-Medical): No  Physical Activity: Sufficiently Active (07/04/2021)   Exercise Vital Sign    Days of Exercise per Week: 7 days    Minutes of Exercise per Session: 30 min  Stress: Not on file  Social Connections: Not on file   Family History  Problem Relation Age of Onset   Kidney disease Sister    Diabetes Sister        3/5 sisters have DM   Breast cancer Neg Hx    Colon cancer Neg Hx    Thyroid cancer Neg Hx    No Known Allergies Prior to Admission medications   Medication Sig Start Date End Date Taking? Authorizing Provider  aspirin 81 MG EC tablet Take 1 tablet (81 mg total) by mouth daily. Swallow whole. 02/29/20  Yes Allegra Grana, FNP  carvedilol (COREG) 12.5 MG tablet TAKE 1 TABLET(12.5 MG TOTAL) BY MOUTH 2 TIMES DAILY WITH A MEAL 04/09/23  Yes Arnett, Lyn Records, FNP  enalapril (VASOTEC) 20 MG tablet Take 20 mg by mouth 2 (two) times daily.   Yes [provider]   hydrALAZINE (APRESOLINE) 10 MG tablet TAKE 1/2 TABLET BY MOUTH THREE TIMES DAILY AS NEEDED IF BLOOD PRESSURE IS OVER 140/90 07/16/23  Yes Arnett, Lyn Records, FNP  insulin glargine (LANTUS SOLOSTAR) 100 UNIT/ML Solostar Pen Inject 40 Units into the skin at bedtime. 07/04/23  Yes Allegra Grana, FNP  Multiple Vitamin (MULTIVITAMIN) capsule Take 1 capsule by mouth daily.   Yes [provider]  NIFEdipine (PROCARDIA-XL/NIFEDICAL-XL) 30 MG 24 hr tablet TAKE 1 TABLET(30 MG) BY MOUTH DAILY 08/04/23  Yes Arnett, Lyn Records, FNP  NOVOLOG FLEXPEN 100 UNIT/ML FlexPen 5 breakfast- 10 lunch- 12 at night    40 UNITS DAILY MAX 07/28/23  Yes Shamleffer, Konrad Dolores, MD  olmesartan (BENICAR) 40 MG tablet Take 40 mg by mouth daily.   Yes [provider]  rosuvastatin (CRESTOR) 10 MG tablet Take 1 tablet (10 mg total) by mouth daily. 07/09/23  Yes Allegra Grana, FNP  Accu-Chek Softclix Lancets lancets Use as directed Patient not taking: Reported on 08/27/2023 07/04/23   Allegra Grana, FNP  Blood Glucose Monitoring Suppl DEVI 1 each by Does not apply route in the morning, at noon, and at bedtime. May substitute to any manufacturer covered by patient's insurance. Patient not taking: Reported on 08/27/2023 12/24/22   Allegra Grana, FNP  Continuous Glucose Receiver (DEXCOM G6 RECEIVER) DEVI Use as directed Patient not taking: Reported on 08/27/2023 12/24/22   Allegra Grana, FNP  Continuous Glucose Sensor (DEXCOM G6 SENSOR) MISC PLACE A NEW SENSOR EVERY 10 DAYS. USE TO CHECK BLOOD SUGAR AT LEAST 4 TIMES DAILY 07/04/23   Allegra Grana, FNP  Continuous Glucose Transmitter (DEXCOM G6 TRANSMITTER) MISC USE AS DIRECTED 08/26/23   Shamleffer, Konrad Dolores, MD  dapagliflozin propanediol (FARXIGA) 10 MG TABS tablet TAKE 1 TABLET(10 MG) BY MOUTH DAILY Patient not taking: Reported on 09/02/2023 12/02/22   Allegra Grana, FNP  glucose blood (ACCU-CHEK AVIVA PLUS) test strip Used to check  blood sugar 4x daily. Patient not taking: Reported on 08/27/2023 08/27/21   Allegra Grana, FNP  Insulin Pen Needle (EASY TOUCH PEN NEEDLES) 31G X 5 MM MISC Check sugar 3-4 daily Patient not taking: Reported on 08/27/2023 07/26/21   Shamleffer, Konrad Dolores, MD  Insulin Pen Needle 31G X 5 MM MISC 1 Device by Does not apply route in the morning, at noon, in the evening, and at bedtime. Patient not taking: Reported on 08/27/2023 07/04/23   Allegra Grana, FNP  Semaglutide,0.25 or 0.5MG /DOS, 2 MG/3ML SOPN Inject 0.25 mg into the skin once a week. Patient not taking: Reported on 08/27/2023 07/18/23   Allegra Grana, FNP  traMADol (ULTRAM) 50 MG tablet Take 1 tablet (50 mg total) by mouth every 6 (six) hours as needed. Patient not taking: Reported on 08/27/2023 10/04/22 10/04/23  Edwin Cap, DPM  Vitamin D, Cholecalciferol, 10 MCG (400 UNIT) CAPS Take 1 capsule by mouth daily.    [provider]     Positive ROS:    All other systems have been reviewed and were otherwise negative with the exception of those mentioned in the HPI and as above.  Physical Exam: Vitals:   09/05/23 1228 09/05/23 1300  BP: (!) 201/91 (!) 195/95  Pulse: 76 78  Temp: (!) 97.1 F (36.2 C)   SpO2: 100%     General: Alert, no acute distress Cardiovascular: No pedal edema. RRR. No murmurs noted Respiratory: No cyanosis, no use of accessory musculature. Breathing unlabored. Lungs clear, no wheezing GI: No organomegaly, abdomen is soft and non-tender Psychiatric: Patient is competent for consent with normal mood and affect Lymphatic: No axillary or cervical lymphadenopathy   LE Focused Exam: warm, good capillary refill, neuropathic, full thickness ulcer anterior TMA measuring 3.0 x 2.5 x 0.8cm   Assessment/Plan: LEFT FOOT ULCER Plan for Procedure(s): APPLICATION, ALLOGRAFT, SKIN DEBRIDEMENT, WOUND  All risks, benefits and potential complications including but not limited to pain, swelling,  infection, scar, numbness which may be temporary or permanent, chronic pain, stiffness, nerve pain or damage, wound healing problems, bone healing problems including delayed or non-union, blood clots, cardiopulmonary complications, morbidity, mortality discussed prior to the procedure. All questions  addressed. Informed consent signed and reviewed. She is willing to proceed with surgery today.       Edwin Cap, DPM 09/05/2023 1:24 PM

## 2023-09-08 ENCOUNTER — Encounter: Payer: Self-pay | Admitting: Podiatry

## 2023-09-09 ENCOUNTER — Other Ambulatory Visit: Payer: Self-pay | Admitting: Family

## 2023-09-09 ENCOUNTER — Ambulatory Visit: Payer: Medicaid Other | Admitting: Internal Medicine

## 2023-09-09 ENCOUNTER — Encounter: Payer: Self-pay | Admitting: Internal Medicine

## 2023-09-09 NOTE — Progress Notes (Deleted)
 Name: Chelsea Barton  MRN/ DOB: [id number], [birth year]   Age/ Sex: 63 y.o., female    PCP: Allegra Grana, FNP   Reason for Endocrinology Evaluation: Type 2 Diabetes Mellitus     Date of Initial Endocrinology Visit: 07/20/2021    PATIENT IDENTIFIER: Chelsea Barton is a 63 y.o. female with a past medical history of T2DM , CAD and Dyslipidemia. The patient presented for initial endocrinology clinic visit on 07/20/2021 for consultative assistance with her diabetes management.    HPI: Chelsea Barton was    Diagnosed with DM in many years ago  Prior Medications tried/Intolerance: Metformin Currently checking blood sugars multiple x / day, through CGM.  Hemoglobin A1c has ranged from 7.3% in 2020, peaking at 9.5% in 2014.  Follows with Nephrology      SUBJECTIVE:   During the last visit (09/23/2022): A1c 9.1%  Today (09/09/23): Chelsea Barton is here for a follow up on diabetes management. She has NOT been to our clinic in 11 months. She checks her  blood sugars multiple  times daily. But she forgot her receiver today. The patient has had hypoglycemic episodes since the last clinic visit. Which typically occur at lunch time   She continues to follow-up with podiatry for a left diabetic foot ulcer, scheduled for graft application 10/04/2022  Denies nausea, vomiting or diarrhea   HOME DIABETES REGIMEN: Lantus 36 units daily NovoLog 5 units with breakfast, 10 units with lunch, and 12 units with supper Correction factor: NovoLog (BG -130/30) Farxiga 10 mg through nephrology    Statin: yes ACE-I/ARB: no   CONTINUOUS GLUCOSE MONITORING RECORD INTERPRETATION    Dates of Recording: 3/26-09/23/2022  Sensor description: dexcom  Results statistics:   CGM use % of time 93  Average and SD 243/57  Time in range      14  %  % Time Above 180 41  % Time above 250 45  % Time Below target 0   Glycemic patterns summary: hyperglycemia noted during the day and night   Hyperglycemic  episodes  all day and night   Hypoglycemic episodes occurred n/a  Overnight periods: variable     DIABETIC COMPLICATIONS: Microvascular complications:  CKD IV, retinopathy, S/P left metatarsal amputation  Denies: neuropathy  Last eye exam: Completed 2022  Macrovascular complications:  CAD Denies:  PVD, CVA   PAST HISTORY: Past Medical History:  Past Medical History:  Diagnosis Date   Aortic atherosclerosis (HCC)    CKD (chronic kidney disease), stage IV (HCC)    not on dialysis   COPD (chronic obstructive pulmonary disease) (HCC)    Coronary artery disease    Diastolic dysfunction    a.) TTE 07/18/2021: EF 50-55%, mod LVH, mild LAE, mild-mod MR, AoV sclerosis, G1DD; b.) TTE 08/22/2023: EF 55-60%, mild LVH, G2DD, mild LAE, RVSF norm, mid MR   Esophageal thickening    Hyperlipidemia    Hypertension    Impaired vision    Obesity    Osteomyelitis of left foot (HCC)    a.) s/p LEFT transmetatarsal amputation   PAD (peripheral artery disease) (HCC)    a.) s/p PTA and stenting of LEFT SFA 04/13/2021 --> 6 x 200 mm LifeStent with distal edge just beyond Hunter's canal extending proximally and a 6 x 80 mm LifeStent to the origin of the SFA (stents overlapped by approximately 10 mm); b.) LE vascular duplex 09/26/2022 --> 75-99% stenosis LEFT SFA; c. 01/2023 PTA: L SFA atherectomy and PTA of RCIA.  Proteinuria    Secondary hyperparathyroidism of renal origin (HCC)    Type 2 diabetes mellitus treated with insulin (HCC)    a.) uses Dexcom CGM and OmniPod insulin pump   Past Surgical History:  Past Surgical History:  Procedure Laterality Date   ALLOGRAFT APPLICATION Left 09/05/2023   Procedure: APPLICATION, ALLOGRAFT, SKIN;  Surgeon: Edwin Cap, DPM;  Location: [facility];  Service: Orthopedics/Podiatry;  Laterality: Left;   AMPUTATION TOE  04/10/2021   Procedure: AMPUTATION 2nd TOE, left foot;  Surgeon: Felecia Shelling, DPM;  Location: [facility];  Service: Podiatry;;   CATARACT  EXTRACTION W/ INTRAOCULAR LENS IMPLANT Right    GRAFT APPLICATION Left 12/26/2021   Procedure: GRAFT APPLICATION-left foot;  Surgeon: Edwin Cap, DPM;  Location: [facility];  Service: Podiatry;  Laterality: Left;   GRAFT APPLICATION Left 10/04/2022   Procedure: GRAFT APPLICATION;  Surgeon: Edwin Cap, DPM;  Location: [facility];  Service: Podiatry;  Laterality: Left;   I & D EXTREMITY Left 12/26/2021   Procedure: IRRIGATION AND DEBRIDEMENT EXTREMITY-left foot;  Surgeon: Edwin Cap, DPM;  Location: [facility];  Service: Podiatry;  Laterality: Left;   INCISION AND DRAINAGE Left 04/18/2021   Procedure: INCISION AND DRAINAGE;  Surgeon: Edwin Cap, DPM;  Location: [facility];  Service: Podiatry;  Laterality: Left;   INCISION AND DRAINAGE OF WOUND Left 04/10/2021   Procedure: IRRIGATION AND DEBRIDEMENT WOUND;  Surgeon: Felecia Shelling, DPM;  Location: [facility];  Service: Podiatry;  Laterality: Left;   LOWER EXTREMITY ANGIOGRAPHY Left 01/21/2023   Procedure: Lower Extremity Angiography;  Surgeon: Renford Dills, MD;  Location: [facility];  Service: Cardiovascular;  Laterality: Left;   PERIPHERAL VASCULAR BALLOON ANGIOPLASTY Left 04/13/2021   Procedure: PERIPHERAL VASCULAR BALLOON ANGIOPLASTY;  Surgeon: Renford Dills, MD;  Location: [facility];  Service: Cardiovascular;  Laterality: Left;   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   Procedure: TRANSMETATARSAL AMPUTATION;  Surgeon: Candelaria Stagers, DPM;  Location: [facility];  Service: Podiatry;  Laterality: Left;   WOUND DEBRIDEMENT Left 10/04/2022   Procedure: DEBRIDEMENT WOUND;  Surgeon: Edwin Cap, DPM;  Location: [facility];  Service: Podiatry;  Laterality: Left;   WOUND DEBRIDEMENT Left 09/05/2023   Procedure: DEBRIDEMENT, WOUND;  Surgeon: Edwin Cap, DPM;  Location: [facility];  Service: Orthopedics/Podiatry;  Laterality: Left;  3.5 x 2.5    Social History:  reports that she quit smoking about 18  years ago. Her smoking use included cigarettes. She started smoking about 43 years ago. She has a 25 pack-year smoking history. She has never used smokeless tobacco. She reports that she does not currently use drugs. She reports that she does not drink alcohol. Family History:  Family History  Problem Relation Age of Onset   Kidney disease Sister    Diabetes Sister        3/5 sisters have DM   Breast cancer Neg Hx    Colon cancer Neg Hx    Thyroid cancer Neg Hx      HOME MEDICATIONS: Allergies as of 09/09/2023   No Known Allergies      Medication List        Accurate as of September 09, 2023  6:56 AM. If you have any questions, ask your nurse or doctor.          Accu-Chek Aviva Plus test strip Generic drug: glucose blood Used to check blood sugar 4x daily.   Accu-Chek Softclix Lancets lancets Use as  directed   amoxicillin-clavulanate 875-125 MG tablet Commonly known as: AUGMENTIN Take 1 tablet by mouth 2 (two) times daily.   aspirin EC 81 MG tablet Take 1 tablet (81 mg total) by mouth daily. Swallow whole.   Blood Glucose Monitoring Suppl Devi 1 each by Does not apply route in the morning, at noon, and at bedtime. May substitute to any manufacturer covered by patient's insurance.   carvedilol 12.5 MG tablet Commonly known as: COREG TAKE 1 TABLET(12.5 MG TOTAL) BY MOUTH 2 TIMES DAILY WITH A MEAL   Dexcom G6 Receiver Devi Use as directed   Dexcom G6 Sensor Misc PLACE A NEW SENSOR EVERY 10 DAYS. USE TO CHECK BLOOD SUGAR AT LEAST 4 TIMES DAILY   Dexcom G6 Transmitter Misc USE AS DIRECTED   Easy Touch Pen Needles 31G X 5 MM Misc Generic drug: Insulin Pen Needle Check sugar 3-4 daily   Insulin Pen Needle 31G X 5 MM Misc 1 Device by Does not apply route in the morning, at noon, in the evening, and at bedtime.   enalapril 20 MG tablet Commonly known as: VASOTEC Take 20 mg by mouth 2 (two) times daily.   Farxiga 10 MG Tabs tablet Generic drug: dapagliflozin  propanediol TAKE 1 TABLET(10 MG) BY MOUTH DAILY   hydrALAZINE 10 MG tablet Commonly known as: APRESOLINE TAKE 1/2 TABLET BY MOUTH THREE TIMES DAILY AS NEEDED IF BLOOD PRESSURE IS OVER 140/90   Lantus SoloStar 100 UNIT/ML Solostar Pen Generic drug: insulin glargine Inject 40 Units into the skin at bedtime.   multivitamin capsule Take 1 capsule by mouth daily.   NIFEdipine 30 MG 24 hr tablet Commonly known as: PROCARDIA-XL/NIFEDICAL-XL TAKE 1 TABLET(30 MG) BY MOUTH DAILY   NovoLOG FlexPen 100 UNIT/ML FlexPen Generic drug: insulin aspart 5 breakfast- 10 lunch- 12 at night    40 UNITS DAILY MAX   olmesartan 40 MG tablet Commonly known as: BENICAR Take 40 mg by mouth daily.   rosuvastatin 10 MG tablet Commonly known as: CRESTOR Take 1 tablet (10 mg total) by mouth daily.   Semaglutide(0.25 or 0.5MG /DOS) 2 MG/3ML Sopn Inject 0.25 mg into the skin once a week.   traMADol 50 MG tablet Commonly known as: Ultram Take 1 tablet (50 mg total) by mouth every 6 (six) hours as needed.   Vitamin D (Cholecalciferol) 10 MCG (400 UNIT) Caps Take 1 capsule by mouth daily.         ALLERGIES: No Known Allergies   REVIEW OF SYSTEMS: A comprehensive ROS was conducted with the patient and is negative except as per HPI and below:      OBJECTIVE:   VITAL SIGNS: There were no vitals taken for this visit.   PHYSICAL EXAM:  General: Pt appears well and is in NAD  Lungs: Clear with good BS bilat  Heart: RRR   Extremities:  Lower extremities - Trace edema on the left   Neuro: MS is good with appropriate affect, pt is alert and Ox3   DM Foot Exam 09/11/2022 per podiatry    DATA REVIEWED:  Lab Results  Component Value Date   HGBA1C 10.3 (A) 07/16/2023   HGBA1C 8.4 (A) 12/24/2022   HGBA1C 9.1 (A) 09/23/2022     Latest Reference Range & Units 05/29/22 09:24  Sodium 135 - 145 mEq/L 140  Potassium 3.5 - 5.1 mEq/L 5.0  Chloride 96 - 112 mEq/L 102  CO2 19 - 32 mEq/L 33 (H)   Glucose 70 - 99 mg/dL 161 (H)  BUN 6 - 23 mg/dL 39 (H)  Creatinine 9.52 - 1.20 mg/dL 8.41 (H)  Calcium 8.4 - 10.5 mg/dL 9.4  Alkaline Phosphatase 39 - 117 U/L 74  Albumin 3.5 - 5.2 g/dL 3.8  AST 0 - 37 U/L 17  ALT 0 - 35 U/L 14  Total Protein 6.0 - 8.3 g/dL 7.0  Total Bilirubin 0.2 - 1.2 mg/dL 0.6  GFR >32.44 mL/min 22.54 (L)    Latest Reference Range & Units 05/29/22 09:24  Total CHOL/HDL Ratio  2  Cholesterol 0 - 200 mg/dL 010  HDL Cholesterol >27.25 mg/dL 36.64  LDL (calc) 0 - 99 mg/dL 64  NonHDL  40.34  Triglycerides 0.0 - 149.0 mg/dL 74.2  VLDL 0.0 - 59.5 mg/dL 63.8    Latest Reference Range & Units 05/29/22 09:25  Creatinine,U mg/dL 756.4  Microalb, Ur 0.0 - 1.9 mg/dL 33.2 (H)  MICROALB/CREAT RATIO 0.0 - 30.0 mg/g 13.4       ASSESSMENT / PLAN / RECOMMENDATIONS:   1) Type 2 Diabetes Mellitus, Poorlly y controlled, With retinopathic, CKD IV and macrovascular complications - Most recent A1c of 9.1 %. Goal A1c < 7.0 %.    - Her A1c is skewed due to CKD - She continues with worsening glycemic control, we again discussed importance of optimizing glucose to prevent further renal damage, and promote healing of left foot  -She had declined  GLP-1 agonists in the past  - She also had declined insulin pump in the past but today I showed her the Omnipod and she agreed to it - In reviewing CGM, there's evidence of inconsistent insulin intake  -Her GFR is less than 25, hence there is no indication from the endocrinology standpoint to be on SGLT2 inhibitors, per patient this is prescribed by her nephrology so we will defer this to nephrology. - Will increase basal insulin as below      MEDICATIONS: Increase Lantus 40 units daily Continue NovoLog 5 units with breakfast, 10 units with lunch, and 12 units with supper Correction factor: NovoLog (BG -130/30)   EDUCATION / INSTRUCTIONS: BG monitoring instructions: Patient is instructed to check her blood sugars 3 times a day,  before meals. Call [facility] if: BG persistently < 70 . I reviewed the Rule of 15 for the treatment of hypoglycemia in detail with the patient. Literature supplied.   2) Diabetic complications:  Eye: Does  have known diabetic retinopathy.  Neuro/ Feet: Does not have known diabetic peripheral neuropathy. Renal: Patient does have known baseline CKD. She is on an ACEI/ARB at present.  3) Left Forefoot Amputation:   - Per podiatry    Follow-up in 6 months    Signed electronically by: Lyndle Herrlich, MD  [facility]  [facility] [address] [place] [place], [place] [address] Phone: [phone] FAX: [phone]   CC: Allegra Grana, FNP [address] [place] [address] Phone: [phone]  Fax: [phone]    Return to Endocrinology clinic as below: Future Appointments  Date Time Provider Department Center  09/09/2023  8:50 AM Cara Aguino, Konrad Dolores, MD [facility] None  09/12/2023 11:15 AM Felecia Shelling, DPM [facility]  09/24/2023  2:45 PM Edwin Cap, DPM [facility]  10/15/2023  2:45 PM Edwin Cap, DPM [facility]  12/22/2023  9:30 AM Jason Coop, Lyn Records, FNP [facility]

## 2023-09-09 NOTE — Telephone Encounter (Unsigned)
 Copied from CRM [id number]. Topic: Clinical - Medication Refill >> Sep 09, 2023  2:42 PM Truddie Crumble wrote: Most Recent Primary Care Visit:  Provider: Allegra Grana  Department: [facility]  Visit Type: OFFICE VISIT  Date: 07/16/2023  Medication: Insulin Pen Needle (EASY TOUCH PEN NEEDLES) 31G X 5 MM MISC  Has the patient contacted their pharmacy? Yes (Agent: If no, request that the patient contact the pharmacy for the refill. If patient does not wish to contact the pharmacy document the reason why and proceed with request.) (Agent: If yes, when and what did the pharmacy advise?)  Is this the correct pharmacy for this prescription? Yes If no, delete pharmacy and type the correct one.  This is the patient's preferred pharmacy:  [id number] - [place], [place] - [address] AT [facility] [address] [facility] [address] [place] [address] Phone: [phone] Fax: [phone]   Has the prescription been filled recently? Yes  Is the patient out of the medication? Yes  Has the patient been seen for an appointment in the last year OR does the patient have an upcoming appointment? Yes  Can we respond through MyChart? Yes  Agent: Please be advised that Rx refills may take up to 3 business days. We ask that you follow-up with your pharmacy.

## 2023-09-12 ENCOUNTER — Ambulatory Visit (INDEPENDENT_AMBULATORY_CARE_PROVIDER_SITE_OTHER)

## 2023-09-12 ENCOUNTER — Ambulatory Visit (INDEPENDENT_AMBULATORY_CARE_PROVIDER_SITE_OTHER): Admitting: Podiatry

## 2023-09-12 ENCOUNTER — Encounter: Payer: Self-pay | Admitting: Podiatry

## 2023-09-12 DIAGNOSIS — L97522 Non-pressure chronic ulcer of other part of left foot with fat layer exposed: Secondary | ICD-10-CM | POA: Diagnosis not present

## 2023-09-12 MED ORDER — EASY TOUCH PEN NEEDLES 31G X 5 MM MISC: 2 refills | Status: AC

## 2023-09-16 NOTE — Progress Notes (Signed)
 Chief Complaint  Patient presents with   Routine Post Op    POV # 1 DOS 09/05/23 --- LEFT FOOT DEDRIDEMENT AND GRAFT APPLICATION - [facility] PT Patient states foot feels great no pain since surgery    Subjective:  Patient presents today status post left foot debridement and graft application.  DOS: 09/05/2023.  Dr. Morene Rankins.  Patient is doing well.  Dressings are clean dry and intact.  WBAT surgical shoe as instructed  Past Medical History:  Diagnosis Date   Aortic atherosclerosis (HCC)    CKD (chronic kidney disease), stage IV (HCC)    not on dialysis   COPD (chronic obstructive pulmonary disease) (HCC)    Coronary artery disease    Diastolic dysfunction    a.) TTE 07/18/2021: EF 50-55%, mod LVH, mild LAE, mild-mod MR, AoV sclerosis, G1DD; b.) TTE 08/22/2023: EF 55-60%, mild LVH, G2DD, mild LAE, RVSF norm, mid MR   Esophageal thickening    Hyperlipidemia    Hypertension    Impaired vision    Obesity    Osteomyelitis of left foot (HCC)    a.) s/p LEFT transmetatarsal amputation   PAD (peripheral artery disease) (HCC)    a.) s/p PTA and stenting of LEFT SFA 04/13/2021 --> 6 x 200 mm LifeStent with distal edge just beyond Hunter's canal extending proximally and a 6 x 80 mm LifeStent to the origin of the SFA (stents overlapped by approximately 10 mm); b.) LE vascular duplex 09/26/2022 --> 75-99% stenosis LEFT SFA; c. 01/2023 PTA: L SFA atherectomy and PTA of RCIA.   Proteinuria    Secondary hyperparathyroidism of renal origin (HCC)    Type 2 diabetes mellitus treated with insulin (HCC)    a.) uses Dexcom CGM and OmniPod insulin pump    Past Surgical History:  Procedure Laterality Date   ALLOGRAFT APPLICATION Left 09/05/2023   Procedure: APPLICATION, ALLOGRAFT, SKIN;  Surgeon: Edwin Cap, DPM;  Location: [facility];  Service: Orthopedics/Podiatry;  Laterality: Left;   AMPUTATION TOE  04/10/2021   Procedure: AMPUTATION 2nd TOE, left foot;  Surgeon: Felecia Shelling, DPM;   Location: [facility];  Service: Podiatry;;   CATARACT EXTRACTION W/ INTRAOCULAR LENS IMPLANT Right    GRAFT APPLICATION Left 12/26/2021   Procedure: GRAFT APPLICATION-left foot;  Surgeon: Edwin Cap, DPM;  Location: [facility];  Service: Podiatry;  Laterality: Left;   GRAFT APPLICATION Left 10/04/2022   Procedure: GRAFT APPLICATION;  Surgeon: Edwin Cap, DPM;  Location: [facility];  Service: Podiatry;  Laterality: Left;   I & D EXTREMITY Left 12/26/2021   Procedure: IRRIGATION AND DEBRIDEMENT EXTREMITY-left foot;  Surgeon: Edwin Cap, DPM;  Location: [facility];  Service: Podiatry;  Laterality: Left;   INCISION AND DRAINAGE Left 04/18/2021   Procedure: INCISION AND DRAINAGE;  Surgeon: Edwin Cap, DPM;  Location: [facility];  Service: Podiatry;  Laterality: Left;   INCISION AND DRAINAGE OF WOUND Left 04/10/2021   Procedure: IRRIGATION AND DEBRIDEMENT WOUND;  Surgeon: Felecia Shelling, DPM;  Location: [facility];  Service: Podiatry;  Laterality: Left;   LOWER EXTREMITY ANGIOGRAPHY Left 01/21/2023   Procedure: Lower Extremity Angiography;  Surgeon: Renford Dills, MD;  Location: [facility];  Service: Cardiovascular;  Laterality: Left;   PERIPHERAL VASCULAR BALLOON ANGIOPLASTY Left 04/13/2021   Procedure: PERIPHERAL VASCULAR BALLOON ANGIOPLASTY;  Surgeon: Renford Dills, MD;  Location: [facility];  Service: Cardiovascular;  Laterality: Left;   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   Procedure: TRANSMETATARSAL  AMPUTATION;  Surgeon: Candelaria Stagers, DPM;  Location: [facility];  Service: Podiatry;  Laterality: Left;   WOUND DEBRIDEMENT Left 10/04/2022   Procedure: DEBRIDEMENT WOUND;  Surgeon: Edwin Cap, DPM;  Location: [facility];  Service: Podiatry;  Laterality: Left;   WOUND DEBRIDEMENT Left 09/05/2023   Procedure: DEBRIDEMENT, WOUND;  Surgeon: Edwin Cap, DPM;  Location: [facility];  Service: Orthopedics/Podiatry;  Laterality: Left;  3.5 x 2.5    No  Known Allergies  Objective/Physical Exam Neurovascular status intact.  Graft is intact.  Well adhered nonadherent Adaptic also noted overlying the graft which was left intact today.  No purulence or drainage.  Overall well appearing graft application site   Assessment: 1. s/p debridement of ulcer with graft application left foot. DOS: 09/05/2023.  Dr. Wilburn Mylar   Plan of Care:  -Patient was evaluated.  -The nonadherent Adaptic adhered to the graft was left intact.  Surgical lubricant was reapplied followed by dry sterile dressings. -Continue WBAT surgical shoe -return to clinic 1 week for follow-up with Dr. Perrin Smack, DPM [facility]  Dr. Felecia Shelling, DPM    [address] [place], [place] [address]                Office [phone]  Fax [phone]

## 2023-09-18 LAB — HM DIABETES EYE EXAM

## 2023-09-24 ENCOUNTER — Encounter: Payer: Self-pay | Admitting: Podiatry

## 2023-09-24 ENCOUNTER — Ambulatory Visit (INDEPENDENT_AMBULATORY_CARE_PROVIDER_SITE_OTHER): Admitting: Podiatry

## 2023-09-24 DIAGNOSIS — L97522 Non-pressure chronic ulcer of other part of left foot with fat layer exposed: Secondary | ICD-10-CM | POA: Diagnosis not present

## 2023-09-25 NOTE — Progress Notes (Signed)
  Subjective:  Patient ID: Chelsea Barton, female    DOB: 08-10-1960,  MRN: [id number]  Chief Complaint  Patient presents with   Routine Post Op    POV # 2 DOS 09/05/23 --- LEFT FOOT DEDRIDEMENT AND GRAFT APPLICATION "It's doing pretty good."     63 y.o. female returns for post-op check.  Doing well she has been applying the dressing and surgical lubricant  Review of Systems: Negative except as noted in the HPI. Denies N/V/F/Ch.   Objective:  There were no vitals filed for this visit. There is no height or weight on file to calculate BMI. Constitutional Well developed. Well nourished.  Vascular Foot warm and well perfused. Capillary refill normal to all digits.  Calf is soft and supple, no posterior calf or knee pain, negative Homans' sign  Neurologic Normal speech. Oriented to person, place, and time. Epicritic sensation to light touch grossly present bilaterally.  Dermatologic Graft has decent integration.  Staples intact.  Orthopedic: No pain to palpation noted about the surgical site.    Assessment:   1. 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  S/p foot surgery left - Staples removed uneventfully.  Graft has decent integration with surrounding hyperkeratosis which I gently debrided today.  Return in 3 weeks for ongoing wound care.  Central portion may need reapplication of graft at some point but we will continue to reevaluate.  Return in about 3 weeks (around 10/15/2023) for wound care.

## 2023-09-29 DIAGNOSIS — I10 Essential (primary) hypertension: Secondary | ICD-10-CM | POA: Diagnosis not present

## 2023-09-29 DIAGNOSIS — E1022 Type 1 diabetes mellitus with diabetic chronic kidney disease: Secondary | ICD-10-CM | POA: Diagnosis not present

## 2023-09-29 DIAGNOSIS — E1042 Type 1 diabetes mellitus with diabetic polyneuropathy: Secondary | ICD-10-CM | POA: Diagnosis not present

## 2023-09-29 DIAGNOSIS — L97529 Non-pressure chronic ulcer of other part of left foot with unspecified severity: Secondary | ICD-10-CM | POA: Diagnosis not present

## 2023-09-29 DIAGNOSIS — E10319 Type 1 diabetes mellitus with unspecified diabetic retinopathy without macular edema: Secondary | ICD-10-CM | POA: Diagnosis not present

## 2023-09-29 DIAGNOSIS — E1065 Type 1 diabetes mellitus with hyperglycemia: Secondary | ICD-10-CM | POA: Diagnosis not present

## 2023-09-29 DIAGNOSIS — E10621 Type 1 diabetes mellitus with foot ulcer: Secondary | ICD-10-CM | POA: Diagnosis not present

## 2023-09-29 DIAGNOSIS — N184 Chronic kidney disease, stage 4 (severe): Secondary | ICD-10-CM | POA: Diagnosis not present

## 2023-09-29 DIAGNOSIS — E1059 Type 1 diabetes mellitus with other circulatory complications: Secondary | ICD-10-CM | POA: Diagnosis not present

## 2023-10-01 ENCOUNTER — Other Ambulatory Visit: Payer: Self-pay | Admitting: Family

## 2023-10-01 DIAGNOSIS — E1039 Type 1 diabetes mellitus with other diabetic ophthalmic complication: Secondary | ICD-10-CM

## 2023-10-01 DIAGNOSIS — I10 Essential (primary) hypertension: Secondary | ICD-10-CM

## 2023-10-15 ENCOUNTER — Encounter: Payer: Self-pay | Admitting: Podiatry

## 2023-10-15 ENCOUNTER — Ambulatory Visit (INDEPENDENT_AMBULATORY_CARE_PROVIDER_SITE_OTHER): Admitting: Podiatry

## 2023-10-15 VITALS — Ht 65.0 in | Wt 200.0 lb

## 2023-10-15 DIAGNOSIS — L97522 Non-pressure chronic ulcer of other part of left foot with fat layer exposed: Secondary | ICD-10-CM

## 2023-10-15 NOTE — Progress Notes (Signed)
  Subjective:  Patient ID: Chelsea Barton, female    DOB: 01-06-1961,  MRN: [id number]  Chief Complaint  Patient presents with   Routine Post Op    Ulcer of left foot with fat layer exposed, wound is closed patient states it feels well and has finally stopped bleeding     63 y.o. female returns for post-op check.  Doing well she has been applying the dressing and surgical lubricant  Review of Systems: Negative except as noted in the HPI. Denies N/V/F/Ch.   Objective:  There were no vitals filed for this visit. Body mass index is 33.28 kg/m. Constitutional Well developed. Well nourished.  Vascular Foot warm and well perfused. Capillary refill normal to all digits.  Calf is soft and supple, no posterior calf or knee pain, negative Homans' sign  Neurologic Normal speech. Oriented to person, place, and time. Epicritic sensation to light touch grossly present bilaterally.  Dermatologic Only partial-thickness skin breakdown.  Surrounding hyperkeratosis.  No infection.  Orthopedic: No pain to palpation noted about the surgical site.    Assessment:   1. 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  S/p foot surgery left - Still healing well.  Return in 3 to 4 weeks for ongoing wound care.  Weightbearing as tolerated in regular shoes.  Return in about 4 weeks (around 11/12/2023) for wound care.

## 2023-10-28 DIAGNOSIS — E10319 Type 1 diabetes mellitus with unspecified diabetic retinopathy without macular edema: Secondary | ICD-10-CM | POA: Diagnosis not present

## 2023-10-28 DIAGNOSIS — E1065 Type 1 diabetes mellitus with hyperglycemia: Secondary | ICD-10-CM | POA: Diagnosis not present

## 2023-10-28 DIAGNOSIS — I10 Essential (primary) hypertension: Secondary | ICD-10-CM | POA: Diagnosis not present

## 2023-10-28 DIAGNOSIS — E1042 Type 1 diabetes mellitus with diabetic polyneuropathy: Secondary | ICD-10-CM | POA: Diagnosis not present

## 2023-10-28 DIAGNOSIS — E1022 Type 1 diabetes mellitus with diabetic chronic kidney disease: Secondary | ICD-10-CM | POA: Diagnosis not present

## 2023-10-28 DIAGNOSIS — N184 Chronic kidney disease, stage 4 (severe): Secondary | ICD-10-CM | POA: Diagnosis not present

## 2023-10-28 DIAGNOSIS — E1059 Type 1 diabetes mellitus with other circulatory complications: Secondary | ICD-10-CM | POA: Diagnosis not present

## 2023-10-30 DIAGNOSIS — E1042 Type 1 diabetes mellitus with diabetic polyneuropathy: Secondary | ICD-10-CM | POA: Diagnosis not present

## 2023-11-03 DIAGNOSIS — R809 Proteinuria, unspecified: Secondary | ICD-10-CM | POA: Diagnosis not present

## 2023-11-03 DIAGNOSIS — I129 Hypertensive chronic kidney disease with stage 1 through stage 4 chronic kidney disease, or unspecified chronic kidney disease: Secondary | ICD-10-CM | POA: Diagnosis not present

## 2023-11-03 DIAGNOSIS — E1122 Type 2 diabetes mellitus with diabetic chronic kidney disease: Secondary | ICD-10-CM | POA: Diagnosis not present

## 2023-11-03 DIAGNOSIS — N184 Chronic kidney disease, stage 4 (severe): Secondary | ICD-10-CM | POA: Diagnosis not present

## 2023-11-03 DIAGNOSIS — N2581 Secondary hyperparathyroidism of renal origin: Secondary | ICD-10-CM | POA: Diagnosis not present

## 2023-11-03 LAB — BASIC METABOLIC PANEL WITH GFR
BUN: 39 — AB (ref 4–21)
Chloride: 107 (ref 99–108)
Creatinine: 2.6 — AB (ref 0.5–1.1)
Potassium: 4.9 meq/L (ref 3.5–5.1)
Sodium: 142 (ref 137–147)

## 2023-11-03 LAB — COMPREHENSIVE METABOLIC PANEL WITH GFR
Albumin: 3.8 (ref 3.5–5.0)
Calcium: 9.6 (ref 8.7–10.7)
eGFR: 20

## 2023-11-03 LAB — MICROALBUMIN / CREATININE URINE RATIO: Microalb Creat Ratio: 0.93

## 2023-11-03 LAB — PROTEIN / CREATININE RATIO, URINE: Creatinine, Urine: 930

## 2023-11-04 ENCOUNTER — Encounter (INDEPENDENT_AMBULATORY_CARE_PROVIDER_SITE_OTHER): Payer: Self-pay

## 2023-11-12 ENCOUNTER — Ambulatory Visit (INDEPENDENT_AMBULATORY_CARE_PROVIDER_SITE_OTHER): Admitting: Podiatry

## 2023-11-12 ENCOUNTER — Encounter: Payer: Self-pay | Admitting: Podiatry

## 2023-11-12 DIAGNOSIS — M79674 Pain in right toe(s): Secondary | ICD-10-CM

## 2023-11-12 DIAGNOSIS — L97522 Non-pressure chronic ulcer of other part of left foot with fat layer exposed: Secondary | ICD-10-CM | POA: Diagnosis not present

## 2023-11-12 DIAGNOSIS — B351 Tinea unguium: Secondary | ICD-10-CM

## 2023-11-12 NOTE — Progress Notes (Signed)
  Subjective:  Patient ID: LASHAYLA ARMES, female    DOB: 10-Aug-1960,  MRN: [id number]  Chief Complaint  Patient presents with   Routine Post Op    POV # 2 DOS 09/05/23 --- LEFT FOOT DEDRIDEMENT AND GRAFT APPLICATION "It's doing good.  My toenails need to be clipped."     63 y.o. female returns for post-op check.  Doing well she has been applying the dressing and surgical lubricant  Review of Systems: Negative except as noted in the HPI. Denies N/V/F/Ch.   Objective:  There were no vitals filed for this visit. There is no height or weight on file to calculate BMI. Constitutional Well developed. Well nourished.  Vascular Foot warm and well perfused. Capillary refill normal to all digits.  Calf is soft and supple, no posterior calf or knee pain, negative Homans' sign  Neurologic Normal speech. Oriented to person, place, and time. Epicritic sensation to light touch grossly present bilaterally.  Dermatologic Residual ulceration full-thickness to subcutaneous layer measuring 3.5 x 2.0 x 0.2 cm.  Thickened elongated mycotic dystrophic nails with subungual debris and dystrophy on the right foot 1 through 5  Orthopedic: No pain to palpation noted about the surgical site.    Assessment:   1. 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 Pain due to onychomycosis of toenail of right foot    Plan:  Patient was evaluated and treated and all questions answered.  S/p foot surgery left - Has recurrent ulceration.  Was debrided of eschar and hyperkeratosis today.  Post debridement measurements are noted above.  This was completed full-thickness in excisional manner to the subcutaneous layer.  Dressed with Iodosorb Telfa 4 x 4 gauze and Coban.  Continue home wound care and follow-up with me in 4 weeks.  Nails become thickened elongated and painful.  Her severe onychomycosis is interfering with ambulation.  Nails were redirected thickness with a sharp nail nipper today to a tolerable  level.  Follow-up as needed for this.  Return in about 4 weeks (around 12/10/2023) for wound care.

## 2023-12-10 ENCOUNTER — Ambulatory Visit: Admitting: Podiatry

## 2023-12-10 ENCOUNTER — Encounter: Payer: Self-pay | Admitting: Podiatry

## 2023-12-10 VITALS — BP 190/99 | HR 86 | Temp 98.1°F

## 2023-12-10 DIAGNOSIS — L97521 Non-pressure chronic ulcer of other part of left foot limited to breakdown of skin: Secondary | ICD-10-CM

## 2023-12-10 NOTE — Progress Notes (Signed)
  Subjective:  Patient ID: Chelsea Barton, female    DOB: 1960/12/28,  MRN: [id number]  Chief Complaint  Patient presents with   Routine Post Op    POV DOS 09/05/23 --- LEFT FOOT DEDRIDEMENT AND GRAFT APPLICATION It's better.  I can wear white socks and it doesn't bleed.        63 y.o. female returns for post-op check.  Doing well she has not seeing any drainage or bleeding  Review of Systems: Negative except as noted in the HPI. Denies N/V/F/Ch.   Objective:   Vitals:   12/10/23 0930  BP: (!) 190/99  Pulse: 86  Temp: 98.1 F (36.7 C)   There is no height or weight on file to calculate BMI. Constitutional Well developed. Well nourished.  Vascular Foot warm and well perfused. Capillary refill normal to all digits.  Calf is soft and supple, no posterior calf or knee pain, negative Homans' sign  Neurologic Normal speech. Oriented to person, place, and time. Epicritic sensation to light touch grossly present bilaterally.  Dermatologic Residual ulceration is partial-thickness with limited skin breakdown and large amount of callus.  Thickened elongated mycotic dystrophic nails with subungual debris and dystrophy on the right foot 1 through 5  Orthopedic: No pain to palpation noted about the surgical site.    Assessment:   1. 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  S/p foot surgery left Improved quite a bit.  Overlying callus was debrided sharply with a tissue nipper.  Bandage applied with Silvadene  for small area of bleeding but overall has improved quite a bit due to blood sugar control.  Follow-up in 1 month to reevaluate.  Return in about 1 month (around 01/09/2024) for wound care.

## 2023-12-22 ENCOUNTER — Ambulatory Visit: Admitting: Family

## 2023-12-22 ENCOUNTER — Encounter: Payer: Self-pay | Admitting: Family

## 2023-12-22 VITALS — BP 115/80 | HR 91 | Temp 98.0°F | Ht 65.0 in | Wt 209.4 lb

## 2023-12-22 DIAGNOSIS — E1039 Type 1 diabetes mellitus with other diabetic ophthalmic complication: Secondary | ICD-10-CM | POA: Diagnosis not present

## 2023-12-22 DIAGNOSIS — I10 Essential (primary) hypertension: Secondary | ICD-10-CM

## 2023-12-22 DIAGNOSIS — E785 Hyperlipidemia, unspecified: Secondary | ICD-10-CM | POA: Diagnosis not present

## 2023-12-22 DIAGNOSIS — E11319 Type 2 diabetes mellitus with unspecified diabetic retinopathy without macular edema: Secondary | ICD-10-CM

## 2023-12-22 DIAGNOSIS — Z794 Long term (current) use of insulin: Secondary | ICD-10-CM | POA: Diagnosis not present

## 2023-12-22 DIAGNOSIS — I700 Atherosclerosis of aorta: Secondary | ICD-10-CM

## 2023-12-22 MED ORDER — ROSUVASTATIN CALCIUM 10 MG PO TABS: 10.0000 mg | ORAL_TABLET | Freq: Every day | ORAL | 3 refills | Status: AC

## 2023-12-22 MED ORDER — CARVEDILOL 12.5 MG PO TABS: ORAL_TABLET | ORAL | 1 refills | Status: AC

## 2023-12-22 NOTE — Patient Instructions (Signed)
 Please request refills for ozempic  and insulin  from Dr Damian who is managing your diabetes  Monitor blood pressure at home and me 5-6 reading on separate days. Goal is less than 120/80, based on newest guidelines, however we certainly want to be less than 130/80;  if persistently higher, please make sooner follow up appointment so we can recheck you blood pressure and manage/ adjust medications.  Please check ALL medications today when you get home and let me know of ANY dose , medication changes needed.   Nice to see you!

## 2023-12-22 NOTE — Progress Notes (Unsigned)
 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   Risks, benefits, and alternatives of the medications and treatment plan prescribed today were discussed, and patient expressed understanding.   Education regarding symptom management and diagnosis given to patient on AVS either electronically or printed.  No follow-ups on file.  Chelsea Northern, FNP  Subjective:    Patient ID: Chelsea Barton, female    DOB: [birth year], 63 y.o.   MRN: [id number]  CC: Chelsea Barton is a 63 y.o. female who presents today for follow up.   HPI: She feels well today She has not taken blood pressure medication this morning.  Denies CP, sob .   She has BP machine at home. BP at home 115-120/80.  Following with Dr Douglas, last seen 11/03/23; started finerenone 10mg  every day.  Continued on dapagliflozin  and enalapril  20mg  BID   Following with Dr Damian for DM1 A1c 7.2 10/28/23 Compliant with ozempic  0.5mg  , novolog  6U, 12u and 12u, glargine 40 units qpm.   Follow up cardiology 07/31/23  continued on carvedilol , enalapril  and nifedipine .    Allergies: Patient has no known allergies. Current Outpatient Medications on File Prior to Visit  Medication Sig Dispense Refill   Accu-Chek Softclix Lancets lancets Use as directed 100 each 2   aspirin  81 MG EC tablet Take 1 tablet (81 mg total) by mouth daily. Swallow whole. 30 tablet 12   Blood Glucose Monitoring Suppl DEVI 1 each by Does not apply route in the morning, at noon, and at bedtime. May substitute to any manufacturer covered by patient's insurance. 1 each 0   carvedilol  (COREG ) 12.5 MG tablet TAKE 1 TABLET(12.5 MG TOTAL) BY MOUTH 2 TIMES DAILY WITH A MEAL 180 tablet 1   Continuous Glucose Receiver (DEXCOM G6 RECEIVER) DEVI Use as directed 1 each 0   Continuous Glucose Sensor  (DEXCOM G6 SENSOR) MISC PLACE A NEW SENSOR EVERY 10 DAYS. USE TO CHECK BLOOD SUGAR AT LEAST 4 TIMES DAILY 9 each 1   Continuous Glucose Transmitter (DEXCOM G6 TRANSMITTER) MISC USE AS DIRECTED 1 each 3   dapagliflozin  propanediol (FARXIGA ) 10 MG TABS tablet TAKE 1 TABLET(10 MG) BY MOUTH DAILY 90 tablet 3   enalapril  (VASOTEC ) 20 MG tablet Take 20 mg by mouth 2 (two) times daily.     glucose blood (ACCU-CHEK AVIVA PLUS) test strip Used to check blood sugar 4x daily. 200 each 12   hydrALAZINE  (APRESOLINE ) 10 MG tablet TAKE 1/2 TABLET BY MOUTH THREE TIMES DAILY AS NEEDED IF BLOOD PRESSURE IS OVER 140/90 30 tablet 1   insulin  glargine (LANTUS  SOLOSTAR) 100 UNIT/ML Solostar Pen Inject 40 Units into the skin at bedtime. 30 mL 1   Insulin  Pen Needle (EASY TOUCH PEN NEEDLES) 31G X 5 MM MISC Check sugar 3-4 daily 100 each 2   Insulin  Pen Needle 31G X 5 MM MISC 1 Device by Does not apply route in the morning, at noon, in the evening, and at bedtime. 400 each 2   Multiple Vitamin (MULTIVITAMIN) capsule Take 1 capsule by mouth daily.     NIFEdipine  (PROCARDIA -XL/NIFEDICAL-XL) 30 MG 24 hr tablet TAKE 1 TABLET(30 MG) BY MOUTH DAILY 90 tablet 1   NOVOLOG  FLEXPEN 100 UNIT/ML FlexPen ADMINISTER 40 UNITS UNDER THE SKIN DAILY AS DIRECTED 15 mL 5   olmesartan (BENICAR) 40 MG tablet Take 40 mg by mouth daily.  rosuvastatin  (CRESTOR ) 10 MG tablet Take 1 tablet (10 mg total) by mouth daily. 90 tablet 3   Semaglutide ,0.25 or 0.5MG /DOS, 2 MG/3ML SOPN Inject 0.25 mg into the skin once a week. 3 mL 2   Vitamin D , Cholecalciferol, 10 MCG (400 UNIT) CAPS Take 1 capsule by mouth daily.     No current facility-administered medications on file prior to visit.    Review of Systems    Objective:    BP (!) 158/90   Pulse 91   Temp 98 F (36.7 C)   Ht 5' 5 (1.651 m)   Wt 209 lb 6.4 oz (95 kg)   SpO2 95%   BMI 34.85 kg/m  BP Readings from Last 3 Encounters:  12/22/23 (!) 158/90  12/10/23 (!) 190/99  09/05/23 (!)  160/69   Wt Readings from Last 3 Encounters:  12/22/23 209 lb 6.4 oz (95 kg)  10/15/23 200 lb (90.7 kg)  09/05/23 200 lb (90.7 kg)    Physical Exam

## 2023-12-24 ENCOUNTER — Telehealth: Payer: Self-pay | Admitting: Family

## 2023-12-24 NOTE — Telephone Encounter (Signed)
 Spoke to pt she stated that when she saw them in April of this year they will contact her when to come back in a year

## 2023-12-24 NOTE — Telephone Encounter (Signed)
 Call pt She is overdue for [facility] heartcare f/u Follow-up cardiology 09/26/2022, Dr Daleen   Please ask her to call to schedule, provide number

## 2023-12-24 NOTE — Assessment & Plan Note (Signed)
 Chronic, symptomatically stable.  Overdue follow-up with cardiology.  Call out to advise patient to follow-up. Continue Crestor  10 mg every day Update lipid panel at f/u.

## 2023-12-24 NOTE — Assessment & Plan Note (Addendum)
 Chronic,stable.  Elevated this morning however patient is not taking antihypertensive yet today.  First readings from home are quite well-controlled.  encouraged her to take medications as scheduled prior to follow-up appointments.  Advised to check blood pressure and take hydralazine  as needed for blood pressure greater than 140/80.    Continue carvedilol  12.5 mg twice daily, enalapril  40 mg, nifedipine  30mg  QD, Hydralazine  5mg  tablet PRN TID, fortunately she has not had to use.

## 2024-01-07 ENCOUNTER — Ambulatory Visit (INDEPENDENT_AMBULATORY_CARE_PROVIDER_SITE_OTHER): Admitting: Podiatry

## 2024-01-07 VITALS — Ht 65.0 in | Wt 209.4 lb

## 2024-01-07 DIAGNOSIS — L97522 Non-pressure chronic ulcer of other part of left foot with fat layer exposed: Secondary | ICD-10-CM | POA: Diagnosis not present

## 2024-01-07 NOTE — Progress Notes (Signed)
  Subjective:  Patient ID: Chelsea Barton, female    DOB: [birth year],  MRN: [id number]  Chief Complaint  Patient presents with   Wound Check    Rm 3 Patient is here for wound care. Wound is closed, no drainage, redness or swelling. Patient has no additional concerns today.     63 y.o. female returns for post-op check.  Doing well she has not seeing any drainage or bleeding  Review of Systems: Negative except as noted in the HPI. Denies N/V/F/Ch.   Objective:   There were no vitals filed for this visit.  Body mass index is 34.85 kg/m. Constitutional Well developed. Well nourished.  Vascular Foot warm and well perfused. Capillary refill normal to all digits.  Calf is soft and supple, no posterior calf or knee pain, negative Homans' sign  Neurologic Normal speech. Oriented to person, place, and time. Epicritic sensation to light touch grossly present bilaterally.  Dermatologic Residual ulceration is again full-thickness with exposed subcutaneous tissue and large amount of callus.  Thickened elongated mycotic dystrophic nails with subungual debris and dystrophy on the right foot 1 through 5  Orthopedic: No pain to palpation noted about the surgical site.    Assessment:   1. 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  S/p foot surgery left Full-thickness excisional debridement with a scalpel was sharply completed today to the subcutaneous layer measuring 3.0 x 2.0 x 0.3 cm.  Hemostasis achieved with silver  nitrate.  Large amount of surrounding hyperkeratosis.  This was removed as well.  Iodosorb and bandage applied.  Follow-up in 1 month.  No follow-ups on file.

## 2024-01-12 DIAGNOSIS — N2581 Secondary hyperparathyroidism of renal origin: Secondary | ICD-10-CM | POA: Diagnosis not present

## 2024-01-12 DIAGNOSIS — R809 Proteinuria, unspecified: Secondary | ICD-10-CM | POA: Diagnosis not present

## 2024-01-12 DIAGNOSIS — N184 Chronic kidney disease, stage 4 (severe): Secondary | ICD-10-CM | POA: Diagnosis not present

## 2024-01-12 DIAGNOSIS — I129 Hypertensive chronic kidney disease with stage 1 through stage 4 chronic kidney disease, or unspecified chronic kidney disease: Secondary | ICD-10-CM | POA: Diagnosis not present

## 2024-01-12 DIAGNOSIS — E1122 Type 2 diabetes mellitus with diabetic chronic kidney disease: Secondary | ICD-10-CM | POA: Diagnosis not present

## 2024-01-19 ENCOUNTER — Other Ambulatory Visit: Payer: Self-pay | Admitting: Family

## 2024-01-19 DIAGNOSIS — E785 Hyperlipidemia, unspecified: Secondary | ICD-10-CM

## 2024-01-19 NOTE — Telephone Encounter (Signed)
 I called and notified patient that this was refilled at her [facility] on 12/22/23 and to check with the pharmacy as I have also re-faxed the proof to them as well. I tried to reach the pharmacy by phone but was on hold for a long time.

## 2024-01-19 NOTE — Telephone Encounter (Signed)
 Copied from CRM [id number]. Topic: Clinical - Medication Refill >> Jan 19, 2024  8:34 AM [place] M wrote: Medication: rosuvastatin  (CRESTOR ) 10 MG tablet  Has the patient contacted their pharmacy? Yes (Agent: If no, request that the patient contact the pharmacy for the refill. If patient does not wish to contact the pharmacy document the reason why and proceed with request.) (Agent: If yes, when and what did the pharmacy advise?)  This is the patient's preferred pharmacy:  [id number] - [place], [place] - [address] AT [facility] [address] [facility] [address] [place] [address] Phone: [phone] Fax: [phone]  Is this the correct pharmacy for this prescription? Yes If no, delete pharmacy and type the correct one.   Has the prescription been filled recently? Yes  Is the patient out of the medication? Yes  Has the patient been seen for an appointment in the last year OR does the patient have an upcoming appointment? Yes  Can we respond through MyChart? Yes  Agent: Please be advised that Rx refills may take up to 3 business days. We ask that you follow-up with your pharmacy.

## 2024-02-04 ENCOUNTER — Other Ambulatory Visit: Payer: Self-pay | Admitting: Family

## 2024-02-04 ENCOUNTER — Ambulatory Visit: Admitting: Podiatry

## 2024-02-04 VITALS — Ht 65.0 in | Wt 209.4 lb

## 2024-02-04 DIAGNOSIS — M79674 Pain in right toe(s): Secondary | ICD-10-CM | POA: Diagnosis not present

## 2024-02-04 DIAGNOSIS — L97522 Non-pressure chronic ulcer of other part of left foot with fat layer exposed: Secondary | ICD-10-CM | POA: Diagnosis not present

## 2024-02-04 DIAGNOSIS — B351 Tinea unguium: Secondary | ICD-10-CM

## 2024-02-04 NOTE — Progress Notes (Signed)
  Subjective:  Patient ID: Chelsea Barton, female    DOB: 1961-04-11,  MRN: [id number]  Chief Complaint  Patient presents with   Wound Check    Rm 1 Patient is here for ulcer of the left foot. Ulcer is closed/scabbed over with no drainage, redness or swelling. Patient states no pain or discomfort from ulceration. Patient is requesting nail trimming today.     63 y.o. female returns for post-op check.  Doing well she has not seeing any drainage or bleeding recently  Review of Systems: Negative except as noted in the HPI. Denies N/V/F/Ch.   Objective:   There were no vitals filed for this visit.  Body mass index is 34.85 kg/m. Constitutional Well developed. Well nourished.  Vascular Foot warm and well perfused. Capillary refill normal to all digits.  Calf is soft and supple, no posterior calf or knee pain, negative Homans' sign  Neurologic Normal speech. Oriented to person, place, and time. Epicritic sensation to light touch grossly present bilaterally.  Dermatologic Residual ulceration is again full-thickness with exposed subcutaneous tissue and large amount of callus.  Due to the amount of callus and hyperkeratosis there are no measurable predebridement wound dimensions.  Following debridement of the wound measures 2.0 x 1.0 x 0.2 cm.  No active drainage.  No sign of infection.  Thickened elongated mycotic dystrophic nails with subungual debris and dystrophy on the right foot 1 through 5  Orthopedic: No pain to palpation noted about the surgical site.    Assessment:   1. 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 Pain due to onychomycosis of toenail of right foot    Plan:  Patient was evaluated and treated and all questions answered.  S/p foot surgery left Full-thickness excisional sharp debridement was completed today with a scalpel to the subcutaneous layer measuring 2.0 x 1.0 x 0.2 cm postdebridement.  Hemostasis achieved manually.  Large amount of surrounding  hyperkeratosis.  This was removed as well.  Silvadene  and bandage applied.  Follow-up in 1 month.  Discussed the etiology and treatment options for the condition in detail with the patient. Recommended debridement of the nails today. Sharp and mechanical debridement performed of all painful and mycotic nails today. Nails debrided in length and thickness using a nail nipper to level of comfort. Follow up as needed for painful nails.    Return in about 4 weeks (around 03/03/2024) for wound care.

## 2024-02-13 DIAGNOSIS — E10319 Type 1 diabetes mellitus with unspecified diabetic retinopathy without macular edema: Secondary | ICD-10-CM | POA: Diagnosis not present

## 2024-02-13 DIAGNOSIS — N184 Chronic kidney disease, stage 4 (severe): Secondary | ICD-10-CM | POA: Diagnosis not present

## 2024-02-13 DIAGNOSIS — E1022 Type 1 diabetes mellitus with diabetic chronic kidney disease: Secondary | ICD-10-CM | POA: Diagnosis not present

## 2024-02-13 DIAGNOSIS — Z1331 Encounter for screening for depression: Secondary | ICD-10-CM | POA: Diagnosis not present

## 2024-02-13 DIAGNOSIS — I10 Essential (primary) hypertension: Secondary | ICD-10-CM | POA: Diagnosis not present

## 2024-02-13 DIAGNOSIS — E1042 Type 1 diabetes mellitus with diabetic polyneuropathy: Secondary | ICD-10-CM | POA: Diagnosis not present

## 2024-02-13 DIAGNOSIS — E1059 Type 1 diabetes mellitus with other circulatory complications: Secondary | ICD-10-CM | POA: Diagnosis not present

## 2024-03-03 ENCOUNTER — Ambulatory Visit: Admitting: Podiatry

## 2024-03-03 VITALS — Ht 65.0 in | Wt 209.4 lb

## 2024-03-03 DIAGNOSIS — L97522 Non-pressure chronic ulcer of other part of left foot with fat layer exposed: Secondary | ICD-10-CM

## 2024-03-03 NOTE — Progress Notes (Signed)
  Subjective:  Patient ID: Chelsea Barton, female    DOB: 1961-05-07,  MRN: [id number]  Chief Complaint  Patient presents with   Wound Check    RM 3 Pt is here for ulcer of left foot. Ulcer is scabbed over with no drainage.     63 y.o. female returns for post-op check.  Doing well has not seen drainage  Review of Systems: Negative except as noted in the HPI. Denies N/V/F/Ch.   Objective:   There were no vitals filed for this visit.  Body mass index is 34.85 kg/m. Constitutional Well developed. Well nourished.  Vascular Foot warm and well perfused. Capillary refill normal to all digits.  Calf is soft and supple, no posterior calf or knee pain, negative Homans' sign  Neurologic Normal speech. Oriented to person, place, and time. Epicritic sensation to light touch grossly present bilaterally.  Dermatologic Residual ulceration is again full-thickness with exposed subcutaneous tissue and large amount of callus.  Due to the amount of callus and hyperkeratosis there are no measurable predebridement wound dimensions.,  It appears wound is healed with large callus formation  Orthopedic: No pain to palpation noted about the surgical site.    Assessment:   1. 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  S/p foot surgery left Overall is healed fairly well.  I recommended management of the callus with a pumice stone and urea  40% cream which she will get OTC.  Follow-up with me in 1 month to reevaluate.    Return in about 1 month (around 04/02/2024) for wound care.

## 2024-03-10 ENCOUNTER — Telehealth: Payer: Self-pay | Admitting: *Deleted

## 2024-03-10 DIAGNOSIS — I2510 Atherosclerotic heart disease of native coronary artery without angina pectoris: Secondary | ICD-10-CM

## 2024-03-10 NOTE — Progress Notes (Signed)
 Complex Care Management Note  Care Guide Note 03/10/2024 Name: Chelsea Barton MRN: [id number] DOB: 1960/11/12  Chelsea Barton is a 63 y.o. year old female who sees Arnett, Rollene MATSU, FNP for primary care. I reached out to Monta D Azure by phone today to offer complex care management services.  Chelsea Barton was given information about Complex Care Management services today including:   The Complex Care Management services include support from the care team which includes your Nurse Care Manager, Clinical Social Worker, or Pharmacist.  The Complex Care Management team is here to help remove barriers to the health concerns and goals most important to you. Complex Care Management services are voluntary, and the patient may decline or stop services at any time by request to their care team member.   Complex Care Management Consent Status: Patient agreed to services and verbal consent obtained.   Follow up plan:  Telephone appointment with complex care management team member scheduled for:  03/24/2024  Encounter Outcome:  Patient Scheduled  Thedford Franks, CMA [place]  [facility], [facility] Guide Direct Dial: [phone]  Fax: [phone] Website: [email or web address]

## 2024-03-24 ENCOUNTER — Other Ambulatory Visit: Payer: Self-pay

## 2024-03-24 NOTE — Patient Instructions (Signed)
 Visit Information  Thank you for taking time to visit with me today. Please don't hesitate to contact me if I can be of assistance to you before our next scheduled appointment.  Our next appointment is by telephone on 04/13/24 at 10:30 am Please call the care guide team at [phone] if you need to cancel or reschedule your appointment.   Following is a copy of your care plan:   Goals Addressed             This Visit's Progress    VBCI RN Care Plan - CKD/HTN       Problems:  Chronic Disease Management support and education needs related to CKD Stage 4 and HTN  Goal: Over the next 90 days the Patient will attend all scheduled medical appointments: Patient will attend all appointments as evidenced by chart review        continue to work with RN Care Manager and/or Social Worker to address care management and care coordination needs related to CKD Stage 4 and HTN as evidenced by adherence to care management team scheduled appointments     take all medications exactly as prescribed and will call provider for medication related questions as evidenced by patient reporting compliance with medications    verbalize basic understanding of CKD Stage 4 and HTN disease process and self health management plan as evidenced by taking all medications as prescribed, monitoring BP and reporting repeat elevations promptly to provider, following renal/diabetic/cardiac diet, exercising regularly  Interventions:    Chronic Kidney Disease Interventions: Evaluation of current treatment plan related to chronic kidney disease self management and patient's adherence to plan as established by provider      Provided education to patient re: stroke prevention, s/s of heart attack and stroke    Reviewed prescribed diet renal/heart healthy/diabetic Reviewed medications with patient and discussed importance of compliance    Counseled on the importance of exercise goals with target of 150 minutes per week      Advised patient, providing education and rationale, to monitor blood pressure daily and record, calling PCP for findings outside established parameters    Discussed complications of poorly controlled blood pressure such as heart disease, stroke, circulatory complications, vision complications, kidney impairment, sexual dysfunction    Advised patient to discuss new symptoms/concerns with provider    Screening for signs and symptoms of depression related to chronic disease state      Discussed the impact of chronic kidney disease on daily life and mental health and acknowledged and normalized feelings of disempowerment, fear, and frustration    Assessed social determinant of health barriers    Provided education on kidney disease progression    Engage patient in early, proactive and ongoing discussion about goals of care and what matters most to them    Last practice recorded BP readings:  BP Readings from Last 3 Encounters:  12/22/23 115/80  12/10/23 (!) 190/99  09/05/23 (!) 160/69   Most recent eGFR/CrCl:  Lab Results  Component Value Date   EGFR 20 11/03/2023    No components found for: CRCL    Hypertension Interventions: Last practice recorded BP readings:  BP Readings from Last 3 Encounters:  12/22/23 115/80  12/10/23 (!) 190/99  09/05/23 (!) 160/69   Most recent eGFR/CrCl:  Lab Results  Component Value Date   EGFR 20 11/03/2023    No components found for: CRCL  Evaluation of current treatment plan related to hypertension self management and patient's adherence to plan as established  by provider Provided education to patient re: stroke prevention, s/s of heart attack and stroke Reviewed medications with patient and discussed importance of compliance Discussed plans with patient for ongoing care management follow up and provided patient with direct contact information for care management team Advised patient, providing education and rationale, to monitor blood pressure  daily and record, calling PCP for findings outside established parameters Provided education on prescribed diet renal/heart healthy, diabetic diet Discussed complications of poorly controlled blood pressure such as heart disease, stroke, circulatory complications, vision complications, kidney impairment, sexual dysfunction  Patient Self-Care Activities:  Attend all scheduled provider appointments Call pharmacy for medication refills 3-7 days in advance of running out of medications Call provider office for new concerns or questions  Take medications as prescribed   Work with the social worker to address care coordination needs and will continue to work with the clinical team to address health care and disease management related needs check blood pressure daily write blood pressure results in a log or diary learn about high blood pressure keep a blood pressure log take blood pressure log to all doctor appointments call doctor for signs and symptoms of high blood pressure keep all doctor appointments take medications for blood pressure exactly as prescribed eat more whole grains, fruits and vegetables, lean meats and healthy fats limit salt intake   Plan:  Telephone follow up appointment with care management team member scheduled for:  04/13/2024 at 10:30 am             Please call the Suicide and Crisis Lifeline: 988 call the [place]  National Suicide Prevention Lifeline: (564)184-5142 or TTY: (779)552-6236 TTY 8562317450) to talk to a trained counselor call 1-800-273-TALK (toll free, 24 hour hotline) go to [facility] [address], [place] [phone]) call 911 if you are experiencing a Mental Health or Behavioral Health Crisis or need someone to talk to.  The patient verbalized understanding of instructions, educational materials, and care plan provided today and agreed to receive a mailed copy of patient instructions, educational  materials, and care plan.   Nestora Duos, MSN, RN [place]  [facility], [facility] RN Care Manager Direct Dial: [phone] Fax: [phone]   Managing Your Hypertension Hypertension, also called high blood pressure, is when the force of the blood pressing against the walls of the arteries is too strong. Arteries are blood vessels that carry blood from your heart throughout your body. Hypertension forces the heart to work harder to pump blood and may cause the arteries to become narrow or stiff. Understanding blood pressure readings A blood pressure reading includes a higher number over a lower number: The first, or top, number is called the systolic pressure. It is a measure of the pressure in your arteries as your heart beats. The second, or bottom number, is called the diastolic pressure. It is a measure of the pressure in your arteries as the heart relaxes. For most people, a normal blood pressure is below 120/80. Your personal target blood pressure may vary depending on your medical conditions, your age, and other factors. Blood pressure is classified into four stages. Based on your blood pressure reading, your health care provider may use the following stages to determine what type of treatment you need, if any. Systolic pressure and diastolic pressure are measured in a unit called millimeters of mercury (mmHg). Normal Systolic pressure: below 120. Diastolic pressure: below 80. Elevated Systolic pressure: 120-129. Diastolic pressure: below 80. Hypertension stage 1 Systolic  pressure: 130-139. Diastolic pressure: 80-89. Hypertension stage 2 Systolic pressure: 140 or above. Diastolic pressure: 90 or above. How can this condition affect me? Managing your hypertension is very important. Over time, hypertension can damage the arteries and decrease blood flow to parts of the body, including the brain, heart, and kidneys. Having untreated or uncontrolled  hypertension can lead to: A heart attack. A stroke. A weakened blood vessel (aneurysm). Heart failure. Kidney damage. Eye damage. Memory and concentration problems. Vascular dementia. What actions can I take to manage this condition? Hypertension can be managed by making lifestyle changes and possibly by taking medicines. Your health care provider will help you make a plan to bring your blood pressure within a normal range. You may be referred for counseling on a healthy diet and physical activity. Nutrition  Eat a diet that is high in fiber and potassium, and low in salt (sodium), added sugar, and fat. An example eating plan is called the DASH diet. DASH stands for Dietary Approaches to Stop Hypertension. To eat this way: Eat plenty of fresh fruits and vegetables. Try to fill one-half of your plate at each meal with fruits and vegetables. Eat whole grains, such as whole-wheat pasta, brown rice, or whole-grain bread. Fill about one-fourth of your plate with whole grains. Eat low-fat dairy products. Avoid fatty cuts of meat, processed or cured meats, and poultry with skin. Fill about one-fourth of your plate with lean proteins such as fish, chicken without skin, beans, eggs, and tofu. Avoid pre-made and processed foods. These tend to be higher in sodium, added sugar, and fat. Reduce your daily sodium intake. Many people with hypertension should eat less than 1,500 mg of sodium a day. Lifestyle  Work with your health care provider to maintain a healthy body weight or to lose weight. Ask what an ideal weight is for you. Get at least 30 minutes of exercise that causes your heart to beat faster (aerobic exercise) most days of the week. Activities may include walking, swimming, or biking. Include exercise to strengthen your muscles (resistance exercise), such as weight lifting, as part of your weekly exercise routine. Try to do these types of exercises for 30 minutes at least 3 days a week. Do not  use any products that contain nicotine or tobacco. These products include cigarettes, chewing tobacco, and vaping devices, such as e-cigarettes. If you need help quitting, ask your health care provider. Control any long-term (chronic) conditions you have, such as high cholesterol or diabetes. Identify your sources of stress and find ways to manage stress. This may include meditation, deep breathing, or making time for fun activities. Alcohol  use Do not drink alcohol  if: Your health care provider tells you not to drink. You are pregnant, may be pregnant, or are planning to become pregnant. If you drink alcohol : Limit how much you have to: 0-1 drink a day for women. 0-2 drinks a day for men. Know how much alcohol  is in your drink. In the [place] one drink equals one 12 oz bottle of beer (355 mL), one 5 oz glass of wine (148 mL), or one 1 oz glass of hard liquor (44 mL). Medicines Your health care provider may prescribe medicine if lifestyle changes are not enough to get your blood pressure under control and if: Your systolic blood pressure is 130 or higher. Your diastolic blood pressure is 80 or higher. Take medicines only as told by your health care provider. Follow the directions carefully. Blood pressure medicines must be taken as  told by your health care provider. The medicine does not work as well when you skip doses. Skipping doses also puts you at risk for problems. Monitoring Before you monitor your blood pressure: Do not smoke, drink caffeinated beverages, or exercise within 30 minutes before taking a measurement. Use the bathroom and empty your bladder (urinate). Sit quietly for at least 5 minutes before taking measurements. Monitor your blood pressure at home as told by your health care provider. To do this: Sit with your back straight and supported. Place your feet flat on the floor. Do not cross your legs. Support your arm on a flat surface, such as a table. Make sure your upper  arm is at heart level. Each time you measure, take two or three readings one minute apart and record the results. You may also need to have your blood pressure checked regularly by your health care provider. General information Talk with your health care provider about your diet, exercise habits, and other lifestyle factors that may be contributing to hypertension. Review all the medicines you take with your health care provider because there may be side effects or interactions. Keep all follow-up visits. Your health care provider can help you create and adjust your plan for managing your high blood pressure. Where to find more information National Heart, Lung, and Blood Institute: PopSteam.is American Heart Association: www.heart.org Contact a health care provider if: You think you are having a reaction to medicines you have taken. You have repeated (recurrent) headaches. You feel dizzy. You have swelling in your ankles. You have trouble with your vision. Get help right away if: You develop a severe headache or confusion. You have unusual weakness or numbness, or you feel faint. You have severe pain in your chest or abdomen. You vomit repeatedly. You have trouble breathing. These symptoms may be an emergency. Get help right away. Call 911. Do not wait to see if the symptoms will go away. Do not drive yourself to the hospital. Summary Hypertension is when the force of blood pumping through your arteries is too strong. If this condition is not controlled, it may put you at risk for serious complications. Your personal target blood pressure may vary depending on your medical conditions, your age, and other factors. For most people, a normal blood pressure is less than 120/80. Hypertension is managed by lifestyle changes, medicines, or both. Lifestyle changes to help manage hypertension include losing weight, eating a healthy, low-sodium diet, exercising more, stopping smoking, and  limiting alcohol . This information is not intended to replace advice given to you by your health care provider. Make sure you discuss any questions you have with your health care provider. Document Revised: 02/15/2021 Document Reviewed: 02/15/2021 Elsevier Patient Education  2024 Elsevier Inc.    Chronic Kidney Disease in Adults: What to Know Chronic kidney disease (CKD) is when lasting damage happens to the kidneys slowly over time. The kidneys are two organs that do many important things in the body. These include: Taking waste and extra fluid out of the blood to make pee (urine). Making hormones. Keeping the right amount of fluids and chemicals in the body. A small amount of kidney damage may not cause problems. You must take steps to help keep the kidney damage from getting worse. A lot of damage may cause kidney failure. Kidney failure means the kidneys can no longer work right. What are the causes? Diabetes. High blood pressure. Diseases that affect the heart and blood vessels. Other kidney diseases. Diseases that  affect the body's defense system (immune system). A problem with the flow of pee. This may be caused by: Kidney stones. Cancer. An enlarged prostate, in males. A kidney infection or urinary tract infection (UTI) that keeps coming back. What increases the risk? Getting older. The chances of having CKD increase with age. A family history of kidney disease or kidney failure. Having a disease caused by genes. Taking medicines that can harm the kidneys. Being near or having contact with harmful substances. Being very overweight. Using tobacco now or in the past. What are the signs or symptoms? Common symptoms of CKD include: Feeling very tired and having less energy. Swelling of the face, legs, ankles, or feet. Throwing up or feeling like you may throw up. Not wanting to eat as much as normal. Being confused or not able to focus. Twitches and cramps in the leg  muscles or other muscles. Dry, itchy skin. Other symptoms may include: Shortness of breath. Trouble sleeping. Making less pee, or making more pee, especially at night. A taste of metal in your mouth. You may also become anemic. Anemia means there's not enough red blood cells in your blood. You may get symptoms slowly. You may not notice them until the kidney damage gets very bad. How is this diagnosed? CKD may be diagnosed based on: Tests on your blood or pee. Imaging tests, like an ultrasound or a CT scan. A kidney biopsy. For this test, a sample of kidney tissue is removed to be looked at under a microscope. These tests will help to find out how serious the CKD is. How is this treated? Often, there's no cure for CKD. Treatment can help with symptoms and help keep the disease from getting worse. Treatment may include: Treating other problems that are causing your CKD or making it worse. Diet changes. You may need to: Avoid alcohol . Avoid foods that are high in salt, potassium, phosphorous, and protein. Taking medicines for symptoms and to help control other conditions. Dialysis. This treatment gets harmful waste out of your body. It may be needed if you have kidney failure. Follow these instructions at home: Medicines Take your medicines only as told. The amount of some medicines you take may need to be changed. Do not take any new medicines, vitamins, or supplements unless your health care provider says it's okay. These may make kidney damage worse. Lifestyle Do not smoke, vape, or use nicotine or tobacco. If you drink alcohol : Limit how much you have to: 0-1 drink a day if you're female. 0-2 drinks a day if you're female. Know how much alcohol  is in your drink. In the [place] one drink is one 12 oz bottle of beer (355 mL), one 5 oz glass of wine (148 mL), or one 1 oz glass of hard liquor (44 mL). Stay at a healthy weight. If you need help, ask your provider. General  instructions  Eat and drink as told. Track your blood pressure at home. Tell your provider about any changes. If you have diabetes, track your blood sugar as told. Exercise at least 30 minutes a day, 5 days a week. Keep your shots (vaccinations) up to date. Keep all follow-up visits. Your provider may need to change your treatments over time. Where to find support American Kidney Fund: [place].com.au Kidney School: kidneyschool.org American Association of Kidney Patients: https://www.miller-montoya.com/ Where to find more information National Kidney Foundation: kidney.org Centers for Disease Control and Prevention. To learn more: Go to DiningCalendar.de. Click Search. Type chronic kidney disease in  the search box. Contact a health care provider if: You have new symptoms. You get symptoms of end-stage kidney disease. These include: Headaches. Numbness in your hands or feet. Leg cramps. Easy bruising. Get help right away if: You have a fever. You make less pee than usual. You have pain or bleeding when you pee or poop. You have chest pain. You have shortness of breath. These symptoms may be an emergency. Call 911 right away. Do not wait to see if the symptoms will go away. Do not drive yourself to the hospital. This information is not intended to replace advice given to you by your health care provider. Make sure you discuss any questions you have with your health care provider. Document Revised: 04/15/2023 Document Reviewed: 12/06/2022 Elsevier Patient Education  2024 Elsevier Inc.  Chronic Kidney Disease: Eating Plan Chronic kidney disease (CKD) is when your kidneys aren't working well. They can't remove waste, fluids, and other substances from your blood. When these substances build up, they can worsen kidney damage and affect your health. Eating certain foods can lead to a buildup of these substances. Changing your diet can help prevent more kidney damage. Diet changes may also delay dialysis or even keep  you from needing it. What nutrients should I limit? Work with your health care team and an expert in healthy eating (dietitian) to make a meal plan that's right for you. Foods you can eat and foods you should limit or avoid will depend on: The stage of your kidney disease. Any other conditions you have. The items listed below are not a complete list. Talk with a dietitian to learn what's best for you. Potassium Potassium affects how well your heart beats. Too much potassium in your blood can cause an irregular heartbeat or even a heart attack. You may need to limit foods that are high in potassium, such as: Liquid milk and soy milk. Salt substitutes that contain potassium. Fruits like: Bananas. Apricots. Melon. Prunes and raisins. Kiwi. Nectarines and oranges. Vegetables, such as: Potatoes, sweet potatoes, and yams. Tomatoes. Leafy greens. Beets. Avocado. Pumpkin and winter squash. Beans, like lima beans. Nuts. Phosphorus Phosphorus is a mineral found in your bones. You need a balance between calcium  and phosphorus to build and maintain healthy bones.  Too much added phosphorus from the foods you eat can pull calcium  from your bones. Losing calcium  can make your bones weak and more likely to break. Too much phosphorus can also make your skin itch. You may need to limit foods that are high in phosphorus or that have added phosphorus, such as: Liquid milk and dairy products. Dark-colored sodas or soft drinks. Bran cereals and oatmeal. Protein  Protein helps your body make and keep muscle. Protein also helps to repair your body's cells and tissues.  One of the natural breakdown products of protein is a waste product called urea . When your kidneys aren't working well, they can't remove waste like urea . Reducing protein in your diet can help keep urea  from building up in your blood. Depending on your stage of kidney disease, you may need to eat smaller portions of foods that are high  in protein. Sources of animal protein include: Meat (all types). Fish and seafood. Poultry. Eggs. Dairy. Other protein foods include: Beans and legumes. Nuts and nut butter. Soy, like tofu.  Sodium Salt (sodium) helps to keep a healthy balance of fluids in your body. Too much salt can increase your blood pressure, which can harm your heart and lungs. Extra  salt can also cause your body to keep too much fluid, making your kidneys work harder. You may need to limit or avoid foods that are high in salt, such as: Salt seasonings. Soy and teriyaki sauce. Meats that are: Packaged. Precooked. Cured. Processed. Salted crackers and snack foods. Fast food. Canned soups and foods. Pickled foods. Boxed mixes or ready-to-eat boxed meals and side dishes. Bottled dressings, sauces, and marinades. Talk with your dietitian about how much potassium, phosphorus, protein, and salt you may have each day. What are tips for following this plan? Reading food labels  Check the amount of salt in foods. Limit foods that have salt listed among the first five ingredients. Try to eat low-salt foods. Check the ingredient list for added phosphorus or potassium. Phos in an ingredient is a sign that phosphorus has been added. Do not buy foods that are calcium -enriched or that have calcium  added to them (fortified). Buy canned vegetables and beans that say no salt added. Rinse them before eating. Lifestyle Limit the amount of protein you eat from animal sources each day. Focus on protein from plant sources, like tofu and dried beans, peas, and lentils. Do not add salt to food when cooking or before eating. Do not eat star fruit. It can be toxic for people with kidney problems. Talk with your health care provider before taking any vitamin or mineral supplements. If told by your provider: Track how much liquid you have so you can avoid drinking too much. Try to eat foods that are made mostly from water,  like gelatin, ice cream, soups, and juicy fruits and vegetables. If you have diabetes and chronic kidney disease: If you have diabetes and CKD, you need to keep your blood sugar (glucose) in the target range recommended by your provider. Follow your diabetes management plan. This may include: Checking your blood glucose regularly. Taking medicines by mouth, or taking insulin , or both. Exercising for at least 30 minutes on 5 or more days each week, or as told by your provider. Tracking how many servings of carbohydrates you eat at each meal. Not using orange juice to treat low blood sugars. Instead, use apple juice, cranberry juice, or clear soda. You may be given guidelines on what foods and nutrients you may eat, and how much you can have each day. This depends on your stage of kidney disease and whether you have high blood pressure. Follow the meal plan your dietitian gives you. Where to find more information [employer] of Diabetes and Digestive and Kidney Diseases: StageSync.si National Kidney Foundation: kidney.org This information is not intended to replace advice given to you by your health care provider. Make sure you discuss any questions you have with your health care provider. Document Revised: 01/14/2023 Document Reviewed: 01/14/2023 Elsevier Patient Education  2024 [employer].   Fall Prevention in the Home, Adult Falls can cause injuries and can happen to people of all ages. There are many things you can do to make your home safer and to help prevent falls. What actions can I take to prevent falls? General information Use good lighting in all rooms. Make sure to: Replace any light bulbs that burn out. Turn on the lights in dark areas and use night-lights. Keep items that you use often in easy-to-reach places. Lower the shelves around your home if needed. Move furniture so that there are clear paths around it. Do not use throw rugs or other things on the floor that  can make you trip. If any  of your floors are uneven, fix them. Add color or contrast paint or tape to clearly mark and help you see: Grab bars or handrails. First and last steps of staircases. Where the edge of each step is. If you use a ladder or stepladder: Make sure that it is fully opened. Do not climb a closed ladder. Make sure the sides of the ladder are locked in place. Have someone hold the ladder while you use it. Know where your pets are as you move through your home. What can I do in the bathroom?     Keep the floor dry. Clean up any water on the floor right away. Remove soap buildup in the bathtub or shower. Buildup makes bathtubs and showers slippery. Use non-skid mats or decals on the floor of the bathtub or shower. Attach bath mats securely with double-sided, non-slip rug tape. If you need to sit down in the shower, use a non-slip stool. Install grab bars by the toilet and in the bathtub and shower. Do not use towel bars as grab bars. What can I do in the bedroom? Make sure that you have a light by your bed that is easy to reach. Do not use any sheets or blankets on your bed that hang to the floor. Have a firm chair or bench with side arms that you can use for support when you get dressed. What can I do in the kitchen? Clean up any spills right away. If you need to reach something above you, use a step stool with a grab bar. Keep electrical cords out of the way. Do not use floor polish or wax that makes floors slippery. What can I do with my stairs? Do not leave anything on the stairs. Make sure that you have a light switch at the top and the bottom of the stairs. Make sure that there are handrails on both sides of the stairs. Fix handrails that are broken or loose. Install non-slip stair treads on all your stairs if they do not have carpet. Avoid having throw rugs at the top or bottom of the stairs. Choose a carpet that does not hide the edge of the steps on the  stairs. Make sure that the carpet is firmly attached to the stairs. Fix carpet that is loose or worn. What can I do on the outside of my home? Use bright outdoor lighting. Fix the edges of walkways and driveways and fix any cracks. Clear paths of anything that can make you trip, such as tools or rocks. Add color or contrast paint or tape to clearly mark and help you see anything that might make you trip as you walk through a door, such as a raised step or threshold. Trim any bushes or trees on paths to your home. Check to see if handrails are loose or broken and that both sides of all steps have handrails. Install guardrails along the edges of any raised decks and porches. Have leaves, snow, or ice cleared regularly. Use sand, salt, or ice melter on paths if you live where there is ice and snow during the winter. Clean up any spills in your garage right away. This includes grease or oil spills. What other actions can I take? Review your medicines with your doctor. Some medicines can cause dizziness or changes in blood pressure, which increase your risk of falling. Wear shoes that: Have a low heel. Do not wear high heels. Have rubber bottoms and are closed at the toe. Feel good on  your feet and fit well. Use tools that help you move around if needed. These include: Canes. Walkers. Scooters. Crutches. Ask your doctor what else you can do to help prevent falls. This may include seeing a physical therapist to learn to do exercises to move better and get stronger. Where to find more information Centers for Disease Control and Prevention, STEADI: TonerPromos.no [employer] on Aging: BaseRingTones.pl National Institute on Aging: BaseRingTones.pl Contact a doctor if: You are afraid of falling at home. You feel weak, drowsy, or dizzy at home. You fall at home. Get help right away if you: Lose consciousness or have trouble moving after a fall. Have a fall that causes a head injury. These symptoms may be  an emergency. Get help right away. Call 911. Do not wait to see if the symptoms will go away. Do not drive yourself to the hospital. This information is not intended to replace advice given to you by your health care provider. Make sure you discuss any questions you have with your health care provider. Document Revised: 02/04/2022 Document Reviewed: 02/04/2022 Elsevier Patient Education  2024 [employer].

## 2024-03-24 NOTE — Patient Outreach (Signed)
 Complex Care Management   Visit Note  03/24/2024  Name:  Chelsea Barton MRN: [id number] DOB: April 18, 1961  Situation: Referral received for Complex Care Management related to Chronic Kidney Disease and HTN I obtained verbal consent from Patient.  Visit completed with Patient  on the phone  Background:   Past Medical History:  Diagnosis Date   Aortic atherosclerosis    CKD (chronic kidney disease), stage IV (HCC)    not on dialysis   COPD (chronic obstructive pulmonary disease) (HCC)    Coronary artery disease    Diastolic dysfunction    a.) TTE 07/18/2021: EF 50-55%, mod LVH, mild LAE, mild-mod MR, AoV sclerosis, G1DD; b.) TTE 08/22/2023: EF 55-60%, mild LVH, G2DD, mild LAE, RVSF norm, mid MR   Esophageal thickening    Hyperlipidemia    Hypertension    Impaired vision    Obesity    Osteomyelitis of left foot (HCC)    a.) s/p LEFT transmetatarsal amputation   PAD (peripheral artery disease)    a.) s/p PTA and stenting of LEFT SFA 04/13/2021 --> 6 x 200 mm LifeStent with distal edge just beyond Hunter's canal extending proximally and a 6 x 80 mm LifeStent to the origin of the SFA (stents overlapped by approximately 10 mm); b.) LE vascular duplex 09/26/2022 --> 75-99% stenosis LEFT SFA; c. 01/2023 PTA: L SFA atherectomy and PTA of RCIA.   Proteinuria    Secondary hyperparathyroidism of renal origin    Type 2 diabetes mellitus treated with insulin  (HCC)    a.) uses Dexcom CGM and OmniPod insulin  pump    Assessment: Patient Reported Symptoms:  Cognitive Cognitive Status: No symptoms reported Cognitive/Intellectual Conditions Management [RPT]: None reported or documented in medical history or problem list   Health Maintenance Behaviors: Annual physical exam, Exercise, Healthy diet, Social activities, Spiritual practice(s), Sleep adequate Healing Pattern: Average Health Facilitated by: Prayer/meditation, Healthy diet, Rest  Neurological Neurological Review of Symptoms:  Numbness Neurological Management Strategies: Exercise, Routine screening Neurological Comment: retinopathy and neuropathy  HEENT HEENT Symptoms Reported: No symptoms reported HEENT Management Strategies: Routine screening HEENT Comment: reading glasses - retinopathy, cataract surgery bilat    Cardiovascular Cardiovascular Symptoms Reported: No symptoms reported Does patient have uncontrolled Hypertension?: No Cardiovascular Management Strategies: Medication therapy, Routine screening, Diet modification, Exercise Weight: 189 lb (85.7 kg) (patient report) Cardiovascular Comment: weighs occasionally, BP 2x week - good  Respiratory Respiratory Symptoms Reported: No symptoms reported Respiratory Management Strategies: Routine screening  Endocrine Endocrine Symptoms Reported: No symptoms reported Is patient diabetic?: Yes Is patient checking blood sugars at home?: Yes List most recent blood sugar readings, include date and time of day: Dexcom - FBG 90 no longer with lows at night since insulin  change, discussed sometimes issue with Dexcom staying on - discussed techniques to help adhesion and to call Dexom for clear dressing overlays - will call RNCM if no results Endocrine Self-Management Outcome: 4 (good) Endocrine Comment: No longer having low BG during night since reducing Lantus   Gastrointestinal Gastrointestinal Symptoms Reported: No symptoms reported Gastrointestinal Management Strategies: Exercise, Diet modification, Medication therapy Gastrointestinal Self-Management Outcome: 4 (good)    Genitourinary Genitourinary Symptoms Reported: No symptoms reported    Integumentary Additional Integumentary Details: amputation left foot toes - healed per patient Skin Management Strategies: Routine screening  Musculoskeletal Musculoskelatal Symptoms Reviewed: Muscle pain Additional Musculoskeletal Details: keeps active Musculoskeletal Management Strategies: Routine screening, Activity,  Exercise Musculoskeletal Self-Management Outcome: 4 (good) Falls in the past year?: No Number of falls in past year:  1 or less Was there an injury with Fall?: No Fall Risk Category Calculator: 0 Patient Fall Risk Level: Low Fall Risk Patient at Risk for Falls Due to: Orthopedic patient Fall risk Follow up: Falls evaluation completed, Falls prevention discussed  Psychosocial Psychosocial Symptoms Reported: No symptoms reported Behavioral Management Strategies: Activity, Adequate rest, Exercise, Support system Behavioral Health Self-Management Outcome: 5 (very good) Behavioral Health Comment: reports turning life around and making positive changes Major Change/Loss/Stressor/Fears (CP): Medical condition, family Behaviors When Feeling Stressed/Fearful: sister in hospital Techniques to Cope with Loss/Stress/Change: Diversional activities, Exercise, Spiritual practice(s) Quality of Family Relationships: helpful, involved, supportive    03/24/2024    PHQ2-9 Depression Screening   Little interest or pleasure in doing things Not at all  Feeling down, depressed, or hopeless Not at all  PHQ-2 - Total Score 0  Trouble falling or staying asleep, or sleeping too much    Feeling tired or having little energy    Poor appetite or overeating     Feeling bad about yourself - or that you are a failure or have let yourself or your family down    Trouble concentrating on things, such as reading the newspaper or watching television    Moving or speaking so slowly that other people could have noticed.  Or the opposite - being so fidgety or restless that you have been moving around a lot more than usual    Thoughts that you would be better off dead, or hurting yourself in some way    PHQ2-9 Total Score    If you checked off any problems, how difficult have these problems made it for you to do your work, take care of things at home, or get along with other people    Depression Interventions/Treatment       There were no vitals filed for this visit.  Medications Reviewed Today     Reviewed by Devra Lands, RN (Registered Nurse) on 03/24/24 at 1320  Med List Status: <None>   Medication Order Taking? Sig Documenting Provider Last Dose Status Informant  Accu-Chek Softclix Lancets lancets [id number] Yes Use as directed Dineen Rollene MATSU, FNP  Active   aspirin  81 MG EC tablet [id number] Yes Take 1 tablet (81 mg total) by mouth daily. Swallow whole. Dineen Rollene MATSU, FNP  Active Self  Blood Glucose Monitoring Suppl DEVI [id number] Yes 1 each by Does not apply route in the morning, at noon, and at bedtime. May substitute to any manufacturer covered by patient's insurance. Dineen Rollene MATSU, FNP  Active   carvedilol  (COREG ) 12.5 MG tablet [id number] Yes TAKE 1 TABLET(12.5 MG TOTAL) BY MOUTH 2 TIMES DAILY WITH A MEAL Arnett, Rollene MATSU, FNP  Active   Continuous Glucose Receiver (DEXCOM G6 [place]) DEVI [id number] Yes Use as directed Dineen Rollene MATSU, FNP  Active   Continuous Glucose Sensor (DEXCOM G6 SENSOR) MISC [id number] Yes PLACE A NEW SENSOR EVERY 10 DAYS. USE TO CHECK BLOOD SUGAR AT LEAST 4 TIMES DAILY Dineen Rollene MATSU, FNP  Active   Continuous Glucose Transmitter (DEXCOM G6 TRANSMITTER) [place] [id number] Yes USE AS DIRECTED Shamleffer, Ibtehal Jaralla, MD  Active   dapagliflozin  propanediol (FARXIGA ) 10 MG TABS tablet [id number] Yes TAKE 1 TABLET(10 MG) BY MOUTH DAILY Dineen Rollene MATSU, FNP  Active Self  enalapril  (VASOTEC ) 20 MG tablet [id number] Yes Take 20 mg by mouth 2 (two) times daily. [provider]  Active   Finerenone 10 MG TABS [id number] Yes Take 10 mg by mouth daily. [provider]  Active   glucose blood (ACCU-CHEK AVIVA PLUS) test strip [id number] Yes Used to check blood sugar 4x daily. Dineen Rollene MATSU, FNP  Active   hydrALAZINE  (APRESOLINE ) 10 MG tablet [id number] Yes TAKE 1/2 TABLET BY MOUTH THREE TIMES DAILY AS NEEDED IF BLOOD PRESSURE IS OVER 140/90 Dineen Rollene MATSU, FNP  Active   insulin  glargine (LANTUS  SOLOSTAR) 100 UNIT/ML Solostar Pen [id number] Yes Inject 40 Units into the skin at bedtime.  Patient taking differently: Inject 40 Units into the skin at bedtime. Patient reports reduced to 32U by provider because of low BG at night   Arnett, Rollene MATSU, FNP  Active   Insulin  Pen Needle (EASY TOUCH PEN NEEDLES) 31G X 5 MM MISC [id number] Yes Check sugar 3-4 daily Dineen Rollene MATSU, FNP  Active   Insulin  Pen Needle 31G X 5 MM MISC [id number] Yes 1 Device by Does not apply route in the morning, at noon, in the evening, and at bedtime. Dineen Rollene MATSU, FNP  Active   Multiple Vitamin (MULTIVITAMIN) capsule [id number] Yes Take 1 capsule by mouth daily. [provider]  Active Self  NIFEdipine  (PROCARDIA -XL/NIFEDICAL-XL) 30 MG 24 hr tablet [id number] Yes TAKE 1 TABLET(30 MG) BY MOUTH DAILY Webb, Padonda B, FNP  Active   NOVOLOG  FLEXPEN 100 UNIT/ML FlexPen [id number] Yes ADMINISTER 40 UNITS UNDER THE SKIN DAILY AS DIRECTED  Patient taking differently: ADMINISTER 40 UNITS UNDER THE SKIN DAILY AS DIRECTED Patient reports 6U - 12U - 12U before meals   Dineen Rollene MATSU, FNP  Active   rosuvastatin  (CRESTOR ) 10 MG tablet [id number] Yes Take 1 tablet (10 mg total) by mouth daily. Dineen Rollene MATSU, FNP  Active   Semaglutide ,0.25 or 0.5MG /DOS, 2 MG/3ML NELMA [id number] Yes Inject 0.25 mg into the skin once a week.  Patient taking differently: Inject 0.25 mg into the skin once a week. Patient reports taking 1 mg   Arnett, Margaret G, FNP  Active   Vitamin D , Cholecalciferol, 10 MCG (400 UNIT) CAPS [id number] Yes Take 1 capsule by mouth daily. [provider]  Active Self            Recommendation:   PCP Follow-up Continue Current Plan of Care  Follow Up Plan:   Telephone follow-up 2 Shaden Lacher  Nestora Duos, MSN, RN [facility]  [facility], [facility] RN Care Manager Direct Dial: [phone] Fax:  [phone]

## 2024-03-25 LAB — OPHTHALMOLOGY REPORT-SCANNED

## 2024-03-31 ENCOUNTER — Ambulatory Visit (INDEPENDENT_AMBULATORY_CARE_PROVIDER_SITE_OTHER): Admitting: Podiatry

## 2024-03-31 DIAGNOSIS — L97522 Non-pressure chronic ulcer of other part of left foot with fat layer exposed: Secondary | ICD-10-CM | POA: Diagnosis not present

## 2024-03-31 NOTE — Progress Notes (Signed)
  Subjective:  Patient ID: Chelsea Barton, female    DOB: [birth year],  MRN: [id number]  Chief Complaint  Patient presents with   Wound Check    RM 2 Patient is here for ulcer of the left foott. Wound is closed/scabbed over.     63 y.o. female returns for post-op check.  Doing well has not seen drainage  Review of Systems: Negative except as noted in the HPI. Denies N/V/F/Ch.   Objective:   There were no vitals filed for this visit.  There is no height or weight on file to calculate BMI. Constitutional Well developed. Well nourished.  Vascular Foot warm and well perfused. Capillary refill normal to all digits.  Calf is soft and supple, no posterior calf or knee pain, negative Homans' sign  Neurologic Normal speech. Oriented to person, place, and time. Epicritic sensation to light touch grossly present bilaterally.  Dermatologic Residual ulceration is again full-thickness with exposed subcutaneous tissue and large amount of callus.  Due to the amount of callus and hyperkeratosis there are no measurable predebridement wound dimensions.  There is some recurrence of the partial skin breakdown in the central portion  Orthopedic: No pain to palpation noted about the surgical site.    Assessment:   1. 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  S/p foot surgery left Debrided the hyperkeratosis and remains nearly fully healed.  Bandage applied today with Iodosorb and Biatain dressing.  Continue with dressing at home until drainage ceases.  Follow-up in 1 month to reevaluate.    No follow-ups on file.

## 2024-04-13 ENCOUNTER — Other Ambulatory Visit: Payer: Self-pay

## 2024-04-13 NOTE — Patient Outreach (Signed)
 Complex Care Management   Visit Note  04/13/2024  Name:  Chelsea Barton MRN: [id number] DOB: Dec 30, 1960  Situation: Referral received for Complex Care Management related to Chronic Kidney Disease and HTN I obtained verbal consent from Patient.  Visit completed with Patient  on the phone  Background:   Past Medical History:  Diagnosis Date   Aortic atherosclerosis    CKD (chronic kidney disease), stage IV (HCC)    not on dialysis   COPD (chronic obstructive pulmonary disease) (HCC)    Coronary artery disease    Diastolic dysfunction    a.) TTE 07/18/2021: EF 50-55%, mod LVH, mild LAE, mild-mod MR, AoV sclerosis, G1DD; b.) TTE 08/22/2023: EF 55-60%, mild LVH, G2DD, mild LAE, RVSF norm, mid MR   Esophageal thickening    Hyperlipidemia    Hypertension    Impaired vision    Obesity    Osteomyelitis of left foot (HCC)    a.) s/p LEFT transmetatarsal amputation   PAD (peripheral artery disease)    a.) s/p PTA and stenting of LEFT SFA 04/13/2021 --> 6 x 200 mm LifeStent with distal edge just beyond Hunter's canal extending proximally and a 6 x 80 mm LifeStent to the origin of the SFA (stents overlapped by approximately 10 mm); b.) LE vascular duplex 09/26/2022 --> 75-99% stenosis LEFT SFA; c. 01/2023 PTA: L SFA atherectomy and PTA of RCIA.   Proteinuria    Secondary hyperparathyroidism of renal origin    Type 2 diabetes mellitus treated with insulin  (HCC)    a.) uses Dexcom CGM and OmniPod insulin  pump    Assessment: Patient Reported Symptoms:  Cognitive Cognitive Status: No symptoms reported Cognitive/Intellectual Conditions Management [RPT]: None reported or documented in medical history or problem list   Health Maintenance Behaviors: Annual physical exam  Neurological Neurological Review of Symptoms: No symptoms reported    HEENT HEENT Symptoms Reported: Nasal discharge HEENT Management Strategies: Routine screening HEENT Comment: runny nose with allergies - does not take  anything    Cardiovascular Cardiovascular Symptoms Reported: No symptoms reported Does patient have uncontrolled Hypertension?: No Cardiovascular Management Strategies: Routine screening, Medication therapy, Diet modification, Exercise Cardiovascular Comment: occasional BP yesterday 120/60, no need for hydralazine   Respiratory Respiratory Symptoms Reported: No symptoms reported Additional Respiratory Details: seasonal allerges Respiratory Management Strategies: Routine screening  Endocrine Endocrine Symptoms Reported: No symptoms reported Is patient diabetic?: Yes Is patient checking blood sugars at home?: Yes List most recent blood sugar readings, include date and time of day: Dexcom, no longer lows at night but still occasional below 70 - described proper treatment, got dressing overlays Endocrine Self-Management Outcome: 4 (good)  Gastrointestinal Gastrointestinal Symptoms Reported: No symptoms reported Additional Gastrointestinal Details: ozempic  without issues Gastrointestinal Management Strategies: Medication therapy, Diet modification, Exercise Gastrointestinal Comment: avoiding carbs - appetite reduced with ozempic     Genitourinary Genitourinary Symptoms Reported: No symptoms reported    Integumentary Additional Integumentary Details: Left foot healed per patient - no bleeding, Wound Care/Podiatry 04/28/24 Skin Management Strategies: Routine screening  Musculoskeletal Musculoskelatal Symptoms Reviewed: Back pain, Joint pain, Muscle pain Additional Musculoskeletal Details: keeps busy, does not take anything for pain - deals with it Musculoskeletal Management Strategies: Routine screening, Activity, Exercise Falls in the past year?: No Number of falls in past year: 1 or less Patient at Risk for Falls Due to: Orthopedic patient Fall risk Follow up: Falls evaluation completed, Falls prevention discussed  Psychosocial Psychosocial Symptoms Reported: No symptoms reported Behavioral  Management Strategies: Activity, Adequate rest, Exercise Behaviors When  Feeling Stressed/Fearful: sister in rehab Techniques to [place] with Loss/Stress/Change: Diversional activities, Exercise      04/13/2024    PHQ2-9 Depression Screening   Little interest or pleasure in doing things Not at all  Feeling down, depressed, or hopeless Not at all  PHQ-2 - Total Score 0  Trouble falling or staying asleep, or sleeping too much    Feeling tired or having little energy    Poor appetite or overeating     Feeling bad about yourself - or that you are a failure or have let yourself or your family down    Trouble concentrating on things, such as reading the newspaper or watching television    Moving or speaking so slowly that other people could have noticed.  Or the opposite - being so fidgety or restless that you have been moving around a lot more than usual    Thoughts that you would be better off dead, or hurting yourself in some way    PHQ2-9 Total Score    If you checked off any problems, how difficult have these problems made it for you to do your work, take care of things at home, or get along with other people    Depression Interventions/Treatment      Vitals:   04/13/24 1044  BP: 120/60    Medications Reviewed Today     Reviewed by Devra Lands, RN (Registered Nurse) on 04/13/24 at 1040  Med List Status: <None>   Medication Order Taking? Sig Documenting Provider Last Dose Status Informant  Accu-Chek Softclix Lancets lancets [id number] Yes Use as directed Dineen Rollene MATSU, FNP  Active   aspirin  81 MG EC tablet [id number] Yes Take 1 tablet (81 mg total) by mouth daily. Swallow whole. Dineen Rollene MATSU, FNP  Active Self  Blood Glucose Monitoring Suppl DEVI [id number] Yes 1 each by Does not apply route in the morning, at noon, and at bedtime. May substitute to any manufacturer covered by patient's insurance. Dineen Rollene MATSU, FNP  Active   carvedilol  (COREG ) 12.5 MG tablet [id number]  Yes TAKE 1 TABLET(12.5 MG TOTAL) BY MOUTH 2 TIMES DAILY WITH A MEAL Arnett, Rollene MATSU, FNP  Active   Continuous Glucose Receiver (DEXCOM G6 [place]) DEVI [id number] Yes Use as directed Dineen Rollene MATSU, FNP  Active   Continuous Glucose Sensor (DEXCOM G6 SENSOR) MISC [id number] Yes PLACE A NEW SENSOR EVERY 10 DAYS. USE TO CHECK BLOOD SUGAR AT LEAST 4 TIMES DAILY Dineen Rollene MATSU, FNP  Active   Continuous Glucose Transmitter (DEXCOM G6 TRANSMITTER) [place] [id number] Yes USE AS DIRECTED Shamleffer, Ibtehal Jaralla, MD  Active   dapagliflozin  propanediol (FARXIGA ) 10 MG TABS tablet [id number] Yes TAKE 1 TABLET(10 MG) BY MOUTH DAILY Dineen Rollene MATSU, FNP  Active Self  enalapril  (VASOTEC ) 20 MG tablet [id number] Yes Take 20 mg by mouth 2 (two) times daily. [provider]  Active   Finerenone 10 MG TABS [id number] Yes Take 10 mg by mouth daily. [provider]  Active   glucose blood (ACCU-CHEK AVIVA PLUS) test strip [id number] Yes Used to check blood sugar 4x daily. Dineen Rollene MATSU, FNP  Active   hydrALAZINE  (APRESOLINE ) 10 MG tablet [id number] Yes TAKE 1/2 TABLET BY MOUTH THREE TIMES DAILY AS NEEDED IF BLOOD PRESSURE IS OVER 140/90 Dineen Rollene MATSU, FNP  Active   insulin  glargine (LANTUS  SOLOSTAR) 100 UNIT/ML Solostar Pen [id number] Yes Inject 40 Units into the skin at bedtime.  Patient taking differently: Inject 40 Units into the skin at  bedtime. Patient reports reduced to 32U by provider because of low BG at night   Arnett, Rollene MATSU, FNP  Active   Insulin  Pen Needle (EASY TOUCH PEN NEEDLES) 31G X 5 MM MISC [id number] Yes Check sugar 3-4 daily Dineen Rollene MATSU, FNP  Active   Insulin  Pen Needle 31G X 5 MM MISC [id number] Yes 1 Device by Does not apply route in the morning, at noon, in the evening, and at bedtime. Dineen Rollene MATSU, FNP  Active   Multiple Vitamin (MULTIVITAMIN) capsule [id number] Yes Take 1 capsule by mouth daily. [provider]  Active Self  NIFEdipine   (PROCARDIA -XL/NIFEDICAL-XL) 30 MG 24 hr tablet [id number] Yes TAKE 1 TABLET(30 MG) BY MOUTH DAILY Webb, Padonda B, FNP  Active   NOVOLOG  FLEXPEN 100 UNIT/ML FlexPen [id number] Yes ADMINISTER 40 UNITS UNDER THE SKIN DAILY AS DIRECTED  Patient taking differently: ADMINISTER 40 UNITS UNDER THE SKIN DAILY AS DIRECTED Patient reports 6U - 12U - 12U before meals   Dineen Rollene MATSU, FNP  Active   rosuvastatin  (CRESTOR ) 10 MG tablet [id number] Yes Take 1 tablet (10 mg total) by mouth daily. Dineen Rollene MATSU, FNP  Active   Semaglutide ,0.25 or 0.5MG /DOS, 2 MG/3ML NELMA [id number] Yes Inject 0.25 mg into the skin once a week.  Patient taking differently: Inject 0.25 mg into the skin once a week. Patient reports taking 1 mg   Arnett, Margaret G, FNP  Active   Vitamin D , Cholecalciferol, 10 MCG (400 UNIT) CAPS [id number] Yes Take 1 capsule by mouth daily. [provider]  Active Self            Recommendation:   PCP Follow-up Continue Current Plan of Care  Follow Up Plan:   Closing From:  Complex Care Management  Nestora Duos, MSN, RN [facility]  [facility], [facility] RN Care Manager Direct Dial: [phone] Fax: [phone]

## 2024-04-13 NOTE — Patient Instructions (Signed)
 Visit Information  Thank you for taking time to visit with me today. Please don't hesitate to contact me if I can be of assistance to you before our next scheduled appointment.  Your next care management appointment is no further scheduled appointments.    Closing From: Complex Care Management.  Please call the care guide team at [phone] if you need to cancel, schedule, or reschedule an appointment.   Please call the Suicide and Crisis Lifeline: 988 call the [place]  National Suicide Prevention Lifeline: 508 073 6807 or TTY: (915)023-5940 TTY 4407023144) to talk to a trained counselor call 1-800-273-TALK (toll free, 24 hour hotline) go to [facility] [address] [place] [phone]) call 911 if you are experiencing a Mental Health or Behavioral Health Crisis or need someone to talk to.   Nestora Duos, MSN, RN [facility], [facility] RN Care Manager Direct Dial: [phone] Fax: [phone]

## 2024-04-15 DIAGNOSIS — I129 Hypertensive chronic kidney disease with stage 1 through stage 4 chronic kidney disease, or unspecified chronic kidney disease: Secondary | ICD-10-CM | POA: Diagnosis not present

## 2024-04-15 DIAGNOSIS — N184 Chronic kidney disease, stage 4 (severe): Secondary | ICD-10-CM | POA: Diagnosis not present

## 2024-04-15 DIAGNOSIS — E1122 Type 2 diabetes mellitus with diabetic chronic kidney disease: Secondary | ICD-10-CM | POA: Diagnosis not present

## 2024-04-15 DIAGNOSIS — N2581 Secondary hyperparathyroidism of renal origin: Secondary | ICD-10-CM | POA: Diagnosis not present

## 2024-04-15 DIAGNOSIS — R809 Proteinuria, unspecified: Secondary | ICD-10-CM | POA: Diagnosis not present

## 2024-04-28 ENCOUNTER — Ambulatory Visit: Admitting: Podiatry

## 2024-04-28 VITALS — Ht 65.0 in | Wt 189.0 lb

## 2024-04-28 DIAGNOSIS — L97522 Non-pressure chronic ulcer of other part of left foot with fat layer exposed: Secondary | ICD-10-CM | POA: Diagnosis not present

## 2024-04-28 DIAGNOSIS — B351 Tinea unguium: Secondary | ICD-10-CM | POA: Diagnosis not present

## 2024-04-28 DIAGNOSIS — M79674 Pain in right toe(s): Secondary | ICD-10-CM

## 2024-04-28 NOTE — Progress Notes (Signed)
  Subjective:  Patient ID: Chelsea Barton, female    DOB: 1960/09/08,  MRN: [id number]  Chief Complaint  Patient presents with   Wound Check    RM 2 Patient is here for ulcer of the left foot. Ulcer is scabbed over with no visible drainage.     63 y.o. female returns for post-op check.  Doing well has not seen drainage.  Nails on right foot causing pain and discomfort.  Review of Systems: Negative except as noted in the HPI. Denies N/V/F/Ch.   Objective:   There were no vitals filed for this visit.  Body mass index is 31.45 kg/m. Constitutional Well developed. Well nourished.  Vascular Foot warm and well perfused. Capillary refill normal to all digits.  Calf is soft and supple, no posterior calf or knee pain, negative Homans' sign  Neurologic Normal speech. Oriented to person, place, and time. Epicritic sensation to light touch grossly present bilaterally.  Dermatologic Residual ulceration is again full-thickness with exposed subcutaneous tissue and large amount of callus.  Due to the amount of callus and hyperkeratosis there are no measurable predebridement wound dimensions.  Deep to this there is some hypergranular tissue formation.  Thickened elongated mycotic nails on the right foot  Orthopedic: No pain to palpation noted about the surgical site.    Assessment:   1. 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 Pain due to onychomycosis of toenail of right foot    Plan:  Patient was evaluated and treated and all questions answered.  S/p foot surgery left Debrided the hyperkeratosis and remains nearly fully healed.  There was some hypergranulation and I cauterized this with silver  nitrate.  Bandage applied today with Iodosorb and gauze dressings.  Continue with dressing at home until drainage ceases.  Follow-up in 1 month to reevaluate.  Discussed the etiology and treatment options for the condition in detail with the patient. Recommended debridement of the nails  today. Sharp and mechanical debridement performed of all painful and mycotic nails today. Nails debrided in length and thickness using a nail nipper to level of comfort. Follow up as needed for painful nails.    Return in about 1 month (around 05/28/2024) for wound care.

## 2024-04-30 NOTE — Patient Outreach (Signed)
 RNCM entered nitial assessment date

## 2024-05-10 ENCOUNTER — Other Ambulatory Visit: Payer: Self-pay | Admitting: Family

## 2024-05-10 DIAGNOSIS — Z1231 Encounter for screening mammogram for malignant neoplasm of breast: Secondary | ICD-10-CM

## 2024-05-28 DIAGNOSIS — N184 Chronic kidney disease, stage 4 (severe): Secondary | ICD-10-CM | POA: Diagnosis not present

## 2024-05-28 DIAGNOSIS — E1042 Type 1 diabetes mellitus with diabetic polyneuropathy: Secondary | ICD-10-CM | POA: Diagnosis not present

## 2024-05-28 DIAGNOSIS — E1059 Type 1 diabetes mellitus with other circulatory complications: Secondary | ICD-10-CM | POA: Diagnosis not present

## 2024-05-28 DIAGNOSIS — I10 Essential (primary) hypertension: Secondary | ICD-10-CM | POA: Diagnosis not present

## 2024-05-28 DIAGNOSIS — E1022 Type 1 diabetes mellitus with diabetic chronic kidney disease: Secondary | ICD-10-CM | POA: Diagnosis not present

## 2024-05-28 DIAGNOSIS — E10319 Type 1 diabetes mellitus with unspecified diabetic retinopathy without macular edema: Secondary | ICD-10-CM | POA: Diagnosis not present

## 2024-06-02 ENCOUNTER — Ambulatory Visit: Admitting: Podiatry

## 2024-06-02 VITALS — Ht 65.0 in | Wt 189.0 lb

## 2024-06-02 DIAGNOSIS — L97522 Non-pressure chronic ulcer of other part of left foot with fat layer exposed: Secondary | ICD-10-CM | POA: Diagnosis not present

## 2024-06-05 NOTE — Progress Notes (Signed)
"  °  Subjective:  Patient ID: Chelsea Barton, female    DOB: Mar 30, 1961,  MRN: [id number]  Chief Complaint  Patient presents with   Foot Ulcer    Rm 3 Patient is here for ulcer of the left foot. Ulcer is scabbed over with no visible drainage. Pt states no additional concerns.     63 y.o. female returns for post-op check.  Doing well has not seen drainage but dried blood  Review of Systems: Negative except as noted in the HPI. Denies N/V/F/Ch.   Objective:   There were no vitals filed for this visit.  Body mass index is 31.45 kg/m. Constitutional Well developed. Well nourished.  Vascular Foot warm and well perfused. Capillary refill normal to all digits.  Calf is soft and supple, no posterior calf or knee pain, negative Homans' sign  Neurologic Normal speech. Oriented to person, place, and time. Epicritic sensation to light touch grossly present bilaterally.  Dermatologic Residual ulceration is again full-thickness with exposed subcutaneous tissue and large amount of callus.  Due to the amount of callus and hyperkeratosis there are no measurable predebridement wound dimensions.  Deep to this there is some hypergranular tissue formation.  Thickened elongated mycotic nails on the right foot  Orthopedic: No pain to palpation noted about the surgical site.    Assessment:   1. 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  S/p foot surgery left Debrided the hyperkeratosis and remains nearly fully healed.  There was some hypergranulation and I cauterized this with silver  nitrate.  Bandage applied today with Iodosorb and gauze dressings.  Continue with dressing at home until drainage ceases.  Follow-up in 1 month to reevaluate.      Return in about 1 month (around 07/03/2024) for wound care.  "

## 2024-06-11 ENCOUNTER — Ambulatory Visit
Admission: RE | Admit: 2024-06-11 | Discharge: 2024-06-11 | Disposition: A | Discharge status: Home / Self Care | Source: Ambulatory Visit | Attending: Family | Admitting: Family

## 2024-06-11 DIAGNOSIS — Z1231 Encounter for screening mammogram for malignant neoplasm of breast: Secondary | ICD-10-CM | POA: Insufficient documentation

## 2024-06-24 ENCOUNTER — Other Ambulatory Visit: Payer: Self-pay | Admitting: Family

## 2024-06-24 DIAGNOSIS — I10 Essential (primary) hypertension: Secondary | ICD-10-CM

## 2024-06-30 ENCOUNTER — Encounter: Payer: Self-pay | Admitting: Podiatry

## 2024-06-30 ENCOUNTER — Ambulatory Visit: Admitting: Podiatry

## 2024-06-30 VITALS — Ht 65.0 in | Wt 189.0 lb

## 2024-06-30 DIAGNOSIS — L97522 Non-pressure chronic ulcer of other part of left foot with fat layer exposed: Secondary | ICD-10-CM | POA: Diagnosis not present

## 2024-06-30 NOTE — Progress Notes (Signed)
"  °  Subjective:  Patient ID: Chelsea Barton, female    DOB: [birth year],  MRN: [id number]  Chief Complaint  Patient presents with   Wound Check   Foot Ulcer    RM 1 Patient is here for ulcer of the left foot. Wound is scabbed over with no visible signs of drainage.     64 y.o. female returns for post-op check.  Doing well has not seen drainage but dried blood  Review of Systems: Negative except as noted in the HPI. Denies N/V/F/Ch.   Objective:   There were no vitals filed for this visit.  Body mass index is 31.45 kg/m. Constitutional Well developed. Well nourished.  Vascular Foot warm and well perfused. Capillary refill normal to all digits.  Calf is soft and supple, no posterior calf or knee pain, negative Homans' sign  Neurologic Normal speech. Oriented to person, place, and time. Epicritic sensation to light touch grossly present bilaterally.  Dermatologic Residual ulceration is again full-thickness with exposed subcutaneous tissue and large amount of callus.  Due to the amount of callus and hyperkeratosis there are no measurable predebridement wound dimensions.  Deep to this there is some hypergranular tissue formation.  Thickened elongated mycotic nails on the right foot  Orthopedic: No pain to palpation noted about the surgical site.    Assessment:   1. 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  S/p foot surgery left Debrided the hyperkeratosis and remains nearly fully healed.  There was some hypergranulation and I cauterized this with silver  nitrate.  Bandage applied today with Iodosorb and gauze dressings.  Continue with dressing at home until drainage ceases.  Follow-up in 1 month to reevaluate.      No follow-ups on file.  "

## 2024-07-05 ENCOUNTER — Ambulatory Visit: Admitting: Podiatry

## 2024-07-07 ENCOUNTER — Other Ambulatory Visit (HOSPITAL_COMMUNITY): Payer: Self-pay

## 2024-07-07 ENCOUNTER — Telehealth: Payer: Self-pay

## 2024-07-07 NOTE — Telephone Encounter (Signed)
 Pharmacy Patient Advocate Encounter   Received notification from Physician's Office that prior authorization for Ozempic  2 mg/3 ml sopn is required/requested.   Insurance verification completed.   The patient is insured through [employer].   Per test claim: The current 28 day co-pay is, $4.  No PA needed at this time. This test claim was processed through [facility]- copay amounts may vary at other pharmacies due to pharmacy/plan contracts, or as the patient moves through the different stages of their insurance plan.

## 2024-07-08 ENCOUNTER — Telehealth: Payer: Self-pay

## 2024-07-08 ENCOUNTER — Other Ambulatory Visit (HOSPITAL_COMMUNITY): Payer: Self-pay

## 2024-07-08 NOTE — Telephone Encounter (Signed)
 Pharmacy Patient Advocate Encounter   Received notification from Physician's Office that prior authorization for Ozempic  (0.25 or 0.5 MG/DOSE) 2MG /3ML pen-injectors  is required/requested.   Insurance verification completed.   The patient is insured through [facility] MEDICAID.   Per test claim: PA required; PA submitted to above mentioned insurance via Latent Key/confirmation #/EOC [id number] Status is pending

## 2024-07-09 ENCOUNTER — Other Ambulatory Visit (HOSPITAL_COMMUNITY): Payer: Self-pay

## 2024-07-09 NOTE — Telephone Encounter (Signed)
 Pharmacy Patient Advocate Encounter  Received notification from [employer] that Prior Authorization for Ozempic  (0.25 or 0.5 MG/DOSE) 2MG /3ML pen-injectors has been APPROVED from 07/08/2024 to 07/08/2025. Ran test claim, Copay is $4. This test claim was processed through [facility]- copay amounts may vary at other pharmacies due to pharmacy/plan contracts, or as the patient moves through the different stages of their insurance plan.   PA #/Case ID/Reference #: [id number]

## 2024-07-09 NOTE — Telephone Encounter (Signed)
 Pt has been notified.

## 2024-07-12 ENCOUNTER — Other Ambulatory Visit: Payer: Self-pay | Admitting: Family

## 2024-08-02 ENCOUNTER — Ambulatory Visit: Admitting: Podiatry
